# Patient Record
Sex: Male | Born: 1940
Health system: Southern US, Community
[De-identification: ages and names within clinical notes are randomized; demographics above are authoritative.]

## PROBLEM LIST (undated history)

## (undated) DIAGNOSIS — N529 Male erectile dysfunction, unspecified: Secondary | ICD-10-CM

## (undated) DIAGNOSIS — N12 Tubulo-interstitial nephritis, not specified as acute or chronic: Secondary | ICD-10-CM

## (undated) DIAGNOSIS — Z87442 Personal history of urinary calculi: Secondary | ICD-10-CM

## (undated) DIAGNOSIS — T7840XA Allergy, unspecified, initial encounter: Secondary | ICD-10-CM

## (undated) DIAGNOSIS — D3001 Benign neoplasm of right kidney: Secondary | ICD-10-CM

## (undated) DIAGNOSIS — I2510 Atherosclerotic heart disease of native coronary artery without angina pectoris: Secondary | ICD-10-CM

## (undated) DIAGNOSIS — I10 Essential (primary) hypertension: Secondary | ICD-10-CM

## (undated) DIAGNOSIS — M545 Low back pain: Secondary | ICD-10-CM

## (undated) DIAGNOSIS — D49519 Neoplasm of unspecified behavior of unspecified kidney: Secondary | ICD-10-CM

## (undated) DIAGNOSIS — Z973 Presence of spectacles and contact lenses: Secondary | ICD-10-CM

## (undated) DIAGNOSIS — IMO0002 Reserved for concepts with insufficient information to code with codable children: Secondary | ICD-10-CM

## (undated) DIAGNOSIS — E785 Hyperlipidemia, unspecified: Secondary | ICD-10-CM

## (undated) DIAGNOSIS — C61 Malignant neoplasm of prostate: Secondary | ICD-10-CM

## (undated) DIAGNOSIS — N4340 Spermatocele of epididymis, unspecified: Secondary | ICD-10-CM

## (undated) DIAGNOSIS — M1990 Unspecified osteoarthritis, unspecified site: Secondary | ICD-10-CM

## (undated) DIAGNOSIS — K219 Gastro-esophageal reflux disease without esophagitis: Secondary | ICD-10-CM

## (undated) DIAGNOSIS — R351 Nocturia: Secondary | ICD-10-CM

## (undated) DIAGNOSIS — N400 Enlarged prostate without lower urinary tract symptoms: Secondary | ICD-10-CM

## (undated) HISTORY — DX: Malignant neoplasm of prostate: C61

## (undated) HISTORY — DX: Low back pain: M54.5

## (undated) HISTORY — PX: TONSILLECTOMY AND ADENOIDECTOMY: SUR1326

## (undated) HISTORY — PX: TRANSURETHRAL RESECTION OF PROSTATE: SHX73

## (undated) HISTORY — PX: COLONOSCOPY: SHX174

## (undated) HISTORY — DX: Hyperlipidemia, unspecified: E78.5

## (undated) HISTORY — DX: Allergy, unspecified, initial encounter: T78.40XA

## (undated) HISTORY — DX: Essential (primary) hypertension: I10

---

## 1978-02-28 HISTORY — PX: INGUINAL HERNIA REPAIR: SUR1180

## 1997-08-01 ENCOUNTER — Ambulatory Visit (HOSPITAL_COMMUNITY): Admission: RE | Admit: 1997-08-01 | Discharge: 1997-08-01 | Payer: Self-pay | Admitting: Neurology

## 1998-08-31 ENCOUNTER — Encounter: Payer: Self-pay | Admitting: *Deleted

## 1998-08-31 ENCOUNTER — Inpatient Hospital Stay (HOSPITAL_COMMUNITY): Admission: EM | Admit: 1998-08-31 | Discharge: 1998-09-01 | Payer: Self-pay | Admitting: Emergency Medicine

## 1998-08-31 ENCOUNTER — Encounter: Payer: Self-pay | Admitting: Family Medicine

## 2000-01-27 ENCOUNTER — Encounter: Payer: Self-pay | Admitting: Family Medicine

## 2000-01-27 ENCOUNTER — Encounter: Admission: RE | Admit: 2000-01-27 | Discharge: 2000-01-27 | Payer: Self-pay | Admitting: Family Medicine

## 2001-11-27 ENCOUNTER — Encounter: Payer: Self-pay | Admitting: Internal Medicine

## 2004-04-16 ENCOUNTER — Ambulatory Visit: Payer: Self-pay | Admitting: Gastroenterology

## 2004-07-30 ENCOUNTER — Encounter: Admission: RE | Admit: 2004-07-30 | Discharge: 2004-07-30 | Payer: Self-pay | Admitting: Family Medicine

## 2004-07-30 ENCOUNTER — Encounter: Payer: Self-pay | Admitting: Internal Medicine

## 2005-04-07 ENCOUNTER — Encounter: Admission: RE | Admit: 2005-04-07 | Discharge: 2005-04-07 | Payer: Self-pay | Admitting: Family Medicine

## 2005-04-07 ENCOUNTER — Encounter: Payer: Self-pay | Admitting: Internal Medicine

## 2005-04-08 ENCOUNTER — Encounter: Payer: Self-pay | Admitting: Internal Medicine

## 2005-05-16 LAB — HM COLONOSCOPY

## 2005-06-28 DIAGNOSIS — M545 Low back pain, unspecified: Secondary | ICD-10-CM

## 2005-06-28 HISTORY — DX: Low back pain, unspecified: M54.50

## 2005-07-04 ENCOUNTER — Encounter: Admission: RE | Admit: 2005-07-04 | Discharge: 2005-07-04 | Payer: Self-pay | Admitting: Family Medicine

## 2005-07-04 ENCOUNTER — Encounter: Payer: Self-pay | Admitting: Internal Medicine

## 2005-07-14 ENCOUNTER — Encounter: Admission: RE | Admit: 2005-07-14 | Discharge: 2005-07-14 | Payer: Self-pay | Admitting: Family Medicine

## 2005-08-05 ENCOUNTER — Encounter: Admission: RE | Admit: 2005-08-05 | Discharge: 2005-08-05 | Payer: Self-pay | Admitting: Family Medicine

## 2006-02-01 ENCOUNTER — Ambulatory Visit: Payer: Self-pay | Admitting: Gastroenterology

## 2006-02-15 ENCOUNTER — Ambulatory Visit: Payer: Self-pay | Admitting: Gastroenterology

## 2006-02-15 ENCOUNTER — Encounter: Payer: Self-pay | Admitting: Internal Medicine

## 2006-09-11 ENCOUNTER — Encounter: Payer: Self-pay | Admitting: Internal Medicine

## 2007-02-07 ENCOUNTER — Encounter: Payer: Self-pay | Admitting: Internal Medicine

## 2008-03-06 ENCOUNTER — Ambulatory Visit: Payer: Self-pay | Admitting: Internal Medicine

## 2008-03-06 DIAGNOSIS — E785 Hyperlipidemia, unspecified: Secondary | ICD-10-CM

## 2008-03-06 DIAGNOSIS — I10 Essential (primary) hypertension: Secondary | ICD-10-CM

## 2008-03-10 ENCOUNTER — Telehealth: Payer: Self-pay | Admitting: Internal Medicine

## 2008-03-10 ENCOUNTER — Ambulatory Visit: Payer: Self-pay | Admitting: Internal Medicine

## 2008-03-10 LAB — CONVERTED CEMR LAB
ALT: 30 units/L (ref 0–53)
AST: 52 units/L — ABNORMAL HIGH (ref 0–37)
BUN: 18 mg/dL (ref 6–23)
Basophils Relative: 1.4 % (ref 0.0–3.0)
Eosinophils Absolute: 0.1 10*3/uL (ref 0.0–0.7)
GFR calc Af Amer: 109 mL/min
GFR calc non Af Amer: 90 mL/min
LDL Cholesterol: 108 mg/dL — ABNORMAL HIGH (ref 0–99)
Lymphocytes Relative: 27.2 % (ref 12.0–46.0)
Neutro Abs: 4.2 10*3/uL (ref 1.4–7.7)
Neutrophils Relative %: 61.3 % (ref 43.0–77.0)
Potassium: 5.8 meq/L — ABNORMAL HIGH (ref 3.5–5.1)
RBC: 4.87 M/uL (ref 4.22–5.81)
RDW: 12.5 % (ref 11.5–14.6)
TSH: 1.32 microintl units/mL (ref 0.35–5.50)
Total Bilirubin: 1.7 mg/dL — ABNORMAL HIGH (ref 0.3–1.2)
Total Protein: 6.6 g/dL (ref 6.0–8.3)
Triglycerides: 118 mg/dL (ref 0–149)
WBC: 6.7 10*3/uL (ref 4.5–10.5)

## 2008-03-13 ENCOUNTER — Ambulatory Visit: Payer: Self-pay | Admitting: Diagnostic Radiology

## 2008-03-13 ENCOUNTER — Ambulatory Visit (HOSPITAL_BASED_OUTPATIENT_CLINIC_OR_DEPARTMENT_OTHER): Admission: RE | Admit: 2008-03-13 | Discharge: 2008-03-13 | Payer: Self-pay | Admitting: Internal Medicine

## 2008-03-18 ENCOUNTER — Telehealth: Payer: Self-pay | Admitting: Internal Medicine

## 2008-05-13 ENCOUNTER — Ambulatory Visit: Payer: Self-pay | Admitting: Internal Medicine

## 2008-05-13 LAB — CONVERTED CEMR LAB
ALT: 31 units/L (ref 0–53)
BUN: 16 mg/dL (ref 6–23)
CO2: 25 meq/L (ref 19–32)
Creatinine, Ser: 0.9 mg/dL (ref 0.4–1.5)
Glucose, Bld: 101 mg/dL — ABNORMAL HIGH (ref 70–99)
Total Bilirubin: 1.2 mg/dL (ref 0.3–1.2)

## 2008-08-19 ENCOUNTER — Ambulatory Visit: Payer: Self-pay | Admitting: Internal Medicine

## 2008-08-19 LAB — CONVERTED CEMR LAB
BUN: 20 mg/dL (ref 6–23)
CO2: 24 meq/L (ref 19–32)
Chloride: 107 meq/L (ref 96–112)
Glucose, Bld: 97 mg/dL (ref 70–99)
Sodium: 142 meq/L (ref 135–145)

## 2008-08-27 ENCOUNTER — Telehealth: Payer: Self-pay | Admitting: Internal Medicine

## 2008-09-05 ENCOUNTER — Ambulatory Visit: Payer: Self-pay | Admitting: Internal Medicine

## 2008-09-18 ENCOUNTER — Ambulatory Visit: Payer: Self-pay | Admitting: Internal Medicine

## 2008-09-23 ENCOUNTER — Telehealth: Payer: Self-pay | Admitting: Internal Medicine

## 2008-10-23 LAB — CONVERTED CEMR LAB
CO2: 24 meq/L (ref 19–32)
Calcium: 10.5 mg/dL (ref 8.4–10.5)
Creatinine, Ser: 0.97 mg/dL (ref 0.40–1.50)
Glucose, Bld: 103 mg/dL — ABNORMAL HIGH (ref 70–99)
Potassium: 4.7 meq/L (ref 3.5–5.3)

## 2008-11-26 ENCOUNTER — Ambulatory Visit: Payer: Self-pay | Admitting: Internal Medicine

## 2009-03-02 ENCOUNTER — Ambulatory Visit: Payer: Self-pay | Admitting: Internal Medicine

## 2009-03-02 LAB — CONVERTED CEMR LAB
BUN: 18 mg/dL (ref 6–23)
Basophils Absolute: 0 10*3/uL (ref 0.0–0.1)
Basophils Relative: 0 % (ref 0–1)
CO2: 23 meq/L (ref 19–32)
Calcium: 10.4 mg/dL (ref 8.4–10.5)
Eosinophils Absolute: 0.1 10*3/uL (ref 0.0–0.7)
Glucose, Bld: 103 mg/dL — ABNORMAL HIGH (ref 70–99)
Hemoglobin, Urine: NEGATIVE
Leukocytes, UA: NEGATIVE
MCHC: 35.2 g/dL (ref 30.0–36.0)
Neutro Abs: 4.3 10*3/uL (ref 1.7–7.7)
Protein, ur: NEGATIVE mg/dL
Sodium: 144 meq/L (ref 135–145)
Specific Gravity, Urine: 1.02 (ref 1.005–1.030)
Total CHOL/HDL Ratio: 4.6
Total Protein: 6.8 g/dL (ref 6.0–8.3)
Urine Glucose: NEGATIVE mg/dL
VLDL: 31 mg/dL (ref 0–40)
WBC: 7.1 10*3/uL (ref 4.0–10.5)
pH: 6 (ref 5.0–8.0)

## 2009-03-10 ENCOUNTER — Ambulatory Visit: Payer: Self-pay | Admitting: Radiology

## 2009-03-10 ENCOUNTER — Ambulatory Visit (HOSPITAL_BASED_OUTPATIENT_CLINIC_OR_DEPARTMENT_OTHER): Admission: RE | Admit: 2009-03-10 | Discharge: 2009-03-10 | Payer: Self-pay | Admitting: Internal Medicine

## 2009-03-10 ENCOUNTER — Telehealth: Payer: Self-pay | Admitting: Internal Medicine

## 2009-03-10 ENCOUNTER — Ambulatory Visit: Payer: Self-pay | Admitting: Internal Medicine

## 2009-03-10 DIAGNOSIS — N529 Male erectile dysfunction, unspecified: Secondary | ICD-10-CM | POA: Insufficient documentation

## 2009-05-07 ENCOUNTER — Ambulatory Visit: Payer: Self-pay | Admitting: Internal Medicine

## 2009-05-07 LAB — CONVERTED CEMR LAB
Chloride: 106 meq/L (ref 96–112)
Creatinine, Ser: 0.96 mg/dL (ref 0.40–1.50)
Glucose, Bld: 107 mg/dL — ABNORMAL HIGH (ref 70–99)

## 2009-05-12 ENCOUNTER — Ambulatory Visit: Payer: Self-pay | Admitting: Internal Medicine

## 2009-05-12 DIAGNOSIS — K429 Umbilical hernia without obstruction or gangrene: Secondary | ICD-10-CM | POA: Insufficient documentation

## 2009-07-01 ENCOUNTER — Telehealth: Payer: Self-pay | Admitting: Internal Medicine

## 2009-07-15 ENCOUNTER — Encounter: Payer: Self-pay | Admitting: Internal Medicine

## 2009-08-18 ENCOUNTER — Telehealth: Payer: Self-pay | Admitting: Internal Medicine

## 2009-08-18 ENCOUNTER — Encounter: Payer: Self-pay | Admitting: Internal Medicine

## 2009-08-21 ENCOUNTER — Ambulatory Visit (HOSPITAL_COMMUNITY): Admission: RE | Admit: 2009-08-21 | Discharge: 2009-08-23 | Payer: Self-pay | Admitting: Surgery

## 2009-08-21 ENCOUNTER — Encounter (INDEPENDENT_AMBULATORY_CARE_PROVIDER_SITE_OTHER): Payer: Self-pay | Admitting: Surgery

## 2009-08-21 HISTORY — PX: OTHER SURGICAL HISTORY: SHX169

## 2009-08-25 ENCOUNTER — Telehealth: Payer: Self-pay | Admitting: Internal Medicine

## 2009-10-02 ENCOUNTER — Encounter: Payer: Self-pay | Admitting: Internal Medicine

## 2009-11-09 ENCOUNTER — Encounter: Payer: Self-pay | Admitting: Internal Medicine

## 2009-11-09 LAB — CONVERTED CEMR LAB
CO2: 25 meq/L (ref 19–32)
Calcium: 10.4 mg/dL (ref 8.4–10.5)
Creatinine, Ser: 0.84 mg/dL (ref 0.40–1.50)
Hgb A1c MFr Bld: 5.5 % (ref <5.7)

## 2009-11-20 ENCOUNTER — Ambulatory Visit: Payer: Self-pay | Admitting: Internal Medicine

## 2010-02-12 ENCOUNTER — Encounter: Payer: Self-pay | Admitting: Internal Medicine

## 2010-03-21 ENCOUNTER — Encounter: Payer: Self-pay | Admitting: Family Medicine

## 2010-03-30 NOTE — Progress Notes (Signed)
Summary: Surgical Referral  Phone Note Call from Patient Call back at [phone]   Caller: Patient Call For: [name] DO Reason for Call: Referral Summary of Call: patient called and left voice message stating he saw Dr Artist Pais a couple of months ago about a hernia. He states the hernia is causing him problems now and would like to know if he could get a referral to  a surgeon for evaluation Initial call taken by: Glendell Docker CMA,  Jul 01, 2009 3:20 PM  Follow-up for Phone Call        Referral received   Appt CCS   Dr Corliss Skains   May 18 Follow-up by: Darral Dash,  Jul 02, 2009 2:39 PM

## 2010-03-30 NOTE — Consult Note (Signed)
Summary: [facility]   Imported By: Lanelle Bal 09/15/2009 09:33:08  _____________________________________________________________________  External Attachment:    Type:   Image     Comment:   External Document

## 2010-03-30 NOTE — Assessment & Plan Note (Signed)
Summary: 3 month fu/dt also flu shot/dt   Vital Signs:  Patient profile:   70 year old male Weight:      193.75 pounds BMI:     26.37 O2 Sat:      97 % on Room air Temp:     98.24 degrees F oral Pulse rate:   54 / minute Pulse rhythm:   regular Resp:     16 per minute BP sitting:   122 / 68  (right arm) Cuff size:   large  Vitals Entered By: Glendell Docker CMA (November 20, 2009 8:03 AM)  O2 Flow:  Room air CC: 3 Month Follow up  Is Patient Diabetic? No Pain Assessment Patient in pain? no      Comments no concerns, refill on 90 day supply on Azor    Primary Care Provider:  Dondra Spry DO  CC:  3 Month Follow up .  History of Present Illness:  Hypertension Follow-Up      This is a 70 year old man who presents for Hypertension follow-up.  The patient denies lightheadedness.  The patient denies the following associated symptoms: chest pain.  Compliance with medications (by patient report) has been near 100%.  The patient reports exercising 3-4X per week.  wife told by her cardiologist to use exercise bike.   he has been using regularly also. he lost wt by cutting back on portion size.  hyperlipidemia - no side effects - myalgias  s/p umbilical hernia repair with mesh.  no complications   ED - 50 yr anniversary coming up tried cialis 10 mg - no side effects .  some improvement    Preventive Screening-Counseling & Management  Alcohol-Tobacco     Smoking Status: quit  Allergies: 1)  Ace Inhibitors  Past History:  Past Medical History: Hyperlipidemia - 20 years Hypertension - 2006   Hx of kidney stones 2007  Family hx of hyperlipidemia Hx of rectal bleeding.  Hx of LBP -  L5 radicular symptoms      MRI of LS spine Severe spondylosis at L4-L5 with central canal stenosis  06/2005 Hx of microscopic hematuria - benign Mildly nodular prostate with BPH on conservative management - Dr. Aldean Ast     Past Surgical History: Colonoscopy 02/15/2006 - Internal  hemorrhoids (Dr. Jarold Motto)     umbilical hernia repair - 2011 Dr. Corliss Skains  Review of Systems       ED- tried cialis  Physical Exam  General:  alert, well-developed, and well-nourished.   Neck:  No deformities, masses, or tenderness noted.no carotid bruits.   Lungs:  normal respiratory effort and normal breath sounds.   Heart:  normal rate, regular rhythm, and no gallop.   Extremities:  No lower extremity edema    Impression & Recommendations:  Problem # 1:  HYPERTENSION (ICD-401.9) Assessment Unchanged  His updated medication list for this problem includes:    Azor 5-20 Mg Tabs (Amlodipine-olmesartan) ..... One by mouth qd  BP today: 122/68 Prior BP: 120/70 (05/12/2009)  Labs Reviewed: K+: 4.8 (11/09/2009) Creat: : 0.84 (11/09/2009)   Chol: 196 (03/02/2009)   HDL: 43 (03/02/2009)   LDL: 122 (03/02/2009)   TG: 157 (03/02/2009)  Problem # 2:  HYPERLIPIDEMIA (ICD-272.4) Assessment: Unchanged we discussed potential interaction with amlodipine.  take 1/2 of 40 mg.  His updated medication list for this problem includes:    Simvastatin 40 Mg Tabs (Simvastatin) .[name]... Take 1/2  tablet by mouth once a day  Complete Medication List: 1)  Simvastatin 40 Mg Tabs (Simvastatin) .... Take 1/2  tablet by mouth once a day 2)  Aspirin Ec Low Dose 81 Mg Tbec (Aspirin) .... Take 1 tablet by mouth once a day 3)  Multivitamins Tabs (Multiple vitamin) .... Take 1 tablet by mouth once a day 4)  Fish Oil 1200 Mg Caps (Omega-3 fatty acids) .... Take 1 tablet by mouth two times a day 5)  Azor 5-20 Mg Tabs (Amlodipine-olmesartan) .... One by mouth qd 6)  Cialis 20 Mg Tabs (Tadalafil) .... 1/2 to one tab by mouth once daily prn  Patient Instructions: 1)  Please schedule a follow-up appointment in 6 months - 30 minute visit 2)  BMP prior to visit, ICD-9:  401.9 3)  Hepatic Panel prior to visit, ICD-9: 272.4 4)  Lipid Panel prior to visit, ICD-9: 272.4 5)  PSA prior to visit, ICD-9: v76.44 6)   Please return for lab work one (1) week before your next appointment.  Prescriptions: CIALIS 20 MG TABS (TADALAFIL) 1/2 to one tab by mouth once daily prn  #10 x 5   Entered and Authorized by:   [name] DO   Signed by:   [name] DO on 11/20/2009   Method used:   Electronically to        [facility]. [phone]* (retail)       [phone]       [place], [place]  [address]       Ph: [phone]       Fax: [phone]   RxID:   [id number] SIMVASTATIN 40 MG TABS (SIMVASTATIN) Take 1/2  tablet by mouth once a day  #90 x 1   Entered and Authorized by:   [name] DO   Signed by:   [name] DO on 11/20/2009   Method used:   Electronically to        [facility]. [phone]* (retail)       [phone]       [place], [place]  [address]       Ph: [phone]       Fax: [phone]   RxID:   [id number] AZOR 5-20 MG TABS (AMLODIPINE-OLMESARTAN) one by mouth qd  #90 x 3   Entered and Authorized by:   [name] DO   Signed by:   [name] DO on 11/20/2009   Method used:   Electronically to        [facility]. [phone]* (retail)       [phone] [address]       [place], [place]  [address]       Ph: [phone]       Fax: [phone]   RxID:   [id number]     Current Allergies (reviewed today): ACE INHIBITORS  Appended Document: Orders Update    Clinical Lists Changes  Orders: Added new Service order of Influenza Vaccine MCR [phone]) - Signed Added new Service order of Flu Vaccine 90yrs + MEDICARE PATIENTS ([id number]) - Signed Observations: Added new observation of FLU VAX#1VIS: 09/22/09 version given November 20, 2009. (11/20/2009 8:37) Added new observation of FLU VAXLOT: [id number] (11/20/2009 8:37) Added new observation of FLU VAX EXP: 08/28/2010 (11/20/2009 8:37) Added new observation of FLU VAXBY: Darlene Knight CMA (11/20/2009  8:37) Added new observation of FLU  VAXRTE: IM (11/20/2009 8:37) Added new observation of FLU VAX DSE: 0.5 ml (11/20/2009 8:37) Added new observation of FLU VAXMFR: GlaxoSmithKline (11/20/2009 8:37) Added new observation of FLU VAX SITE: left deltoid (11/20/2009 8:37) Added new observation of FLU VAX: Fluvax MCR (11/20/2009 8:37)       Immunizations Administered:  Influenza Vaccine # 1:    Vaccine Type: Fluvax MCR    Site: left deltoid    Mfr: GlaxoSmithKline    Dose: 0.5 ml    Route: IM    Given by: Glendell Docker CMA    Exp. Date: 08/28/2010    Lot #: [id number]    VIS given: 09/22/09 version given November 20, 2009.  Flu Vaccine Consent Questions:    Do you have a history of severe allergic reactions to this vaccine? no    Any prior history of allergic reactions to egg and/or gelatin? no    Do you have a sensitivity to the preservative Thimersol? no    Do you have a past history of Guillan-Barre Syndrome? no    Do you currently have an acute febrile illness? no    Have you ever had a severe reaction to latex? no    Vaccine information given and explained to patient? yes

## 2010-03-30 NOTE — Miscellaneous (Signed)
Summary: bmp,hgb a1c  Clinical Lists Changes  Orders: Added new Test order of T-Basic Metabolic Panel ([id number]) - Signed Added new Test order of T- Hemoglobin A1C ([id number]) - Signed 

## 2010-03-30 NOTE — Assessment & Plan Note (Signed)
Summary: 2 mo. f/u - jr   Vital Signs:  Patient profile:   70 year old male Weight:      203.25 pounds BMI:     27.67 O2 Sat:      99 % on Room air Temp:     97.0 degrees F oral Pulse rate:   55 / minute Pulse rhythm:   regular Resp:     19 per minute BP sitting:   120 / 70  (right arm) Cuff size:   large  Vitals Entered By: Glendell Docker CMA (May 12, 2009 8:06 AM)  O2 Flow:  Room air CC: Rm 3- 2 Month Follow up    Primary Care Provider:  Dondra Spry DO  CC:  Rm 3- 2 Month Follow up .  History of Present Illness:  Hypertension Follow-Up      This is a 70 year old man who presents for Hypertension follow-up.  The patient denies lightheadedness and urinary frequency.  The patient denies the following associated symptoms: chest pain.  Compliance with medications (by patient report) has been near 100%.  The patient reports that dietary compliance has been fair.  The patient reports exercising occasionally.  cough resolved since stopping ACE  pt thinks umbilical hernia getting bigger.  still reducible.  no pain  Allergies (verified): 1)  Ace Inhibitors  Past History:  Past Medical History: Hyperlipidemia - 20 years Hypertension - 2006  Hx of kidney stones 2007  Family hx of hyperlipidemia Hx of rectal bleeding.  Hx of LBP -  L5 radicular symptoms      MRI of LS spine Severe spondylosis at L4-L5 with central canal stenosis  06/2005 Hx of microscopic hematuria - benign Mildly nodular prostate with BPH on conservative management - Dr. Aldean Ast     Family History: CAD , hyperlipidemia - multiple family members - mother's side Prostate ca - no Colon ca - mother Diabetes - no   Father had pulmonary fibrosis - his father owned Pharmacologist business (father blamed inhalation of chemicals/fumes)   Social History: Married -48 years 2 daughter 5 grandchildren  Never Smoked  Alcohol use-yes (occasional/social)  Retired- Paramedic   Physical Exam  General:   alert, well-developed, and well-nourished.   Lungs:  normal respiratory effort and normal breath sounds.   Heart:  normal rate, regular rhythm, and no gallop.   Abdomen:  soft, non-tender, normal bowel sounds, no hepatomegaly, and no splenomegaly.  umbilical hernia (reducible) Extremities:  No lower extremity edema    Impression & Recommendations:  Problem # 1:  HYPERTENSION (ICD-401.9) Assessment Improved ACE cough resolved with stopping ACE.  Maintain current medication regimen.  His updated medication list for this problem includes:    Azor 5-20 Mg Tabs (Amlodipine-olmesartan) ..... One by mouth qd  BP today: 120/70 Prior BP: 130/80 (03/10/2009)  Labs Reviewed: K+: 4.7 (05/07/2009) Creat: : 0.96 (05/07/2009)   Chol: 196 (03/02/2009)   HDL: 43 (03/02/2009)   LDL: 122 (03/02/2009)   TG: 157 (03/02/2009)  Problem # 2:  HERNIA, UMBILICAL (ICD-553.1) Assessment: Deteriorated umbilical hernia slightly worse.  he defers referral to surgeon - Dr. Georgiann Mohs.  reviewed red flag symptoms  Complete Medication List: 1)  Simvastatin 40 Mg Tabs (Simvastatin) .... Take 1 tablet by mouth once a day 2)  Aspirin Ec Low Dose 81 Mg Tbec (Aspirin) .... Take 1 tablet by mouth once a day 3)  Multivitamins Tabs (Multiple vitamin) .... Take 1 tablet by mouth once a day 4)  Fish Oil 1200 Mg Caps (Omega-3 fatty acids) .... Take 1 tablet by mouth two times a day 5)  Azor 5-20 Mg Tabs (Amlodipine-olmesartan) .... One by mouth qd 6)  Levitra 20 Mg Tabs (Vardenafil hcl) .... 1/2 by mouth once daily prn  Patient Instructions: 1)  Please schedule a follow-up appointment in 6 months. 2)  BMP prior to visit, ICD-9:  401.9 3)  HbgA1C prior to visit, ICD-9:  790.29 4)  Please return for lab work one (1) week before your next appointment.   Current Allergies (reviewed today): ACE INHIBITORS

## 2010-03-30 NOTE — Letter (Signed)
Summary: [facility]   Imported By: Lanelle Bal 05/19/2009 08:51:40  _____________________________________________________________________  External Attachment:    Type:   Image     Comment:   External Document

## 2010-03-30 NOTE — Assessment & Plan Note (Signed)
Summary: cpx/mhf rsc with patient weather/mhf   Vital Signs:  Patient profile:   70 year old male Height:      72 inches Weight:      221.75 pounds BMI:     30.18 O2 Sat:      97 % on Room air Temp:     98.4 degrees F oral Pulse rate:   66 / minute Pulse rhythm:   regular Resp:     18 per minute BP sitting:   130 / 80  (right arm) Cuff size:   large  Vitals Entered By: Glendell Docker CMA (March 10, 2009 10:03 AM)  O2 Flow:  Room air  Primary Care Provider:  [name] DO  CC:  CPX.  History of Present Illness: CPX  70 y/o with hx of htn, hyperlipidemia, and low back pain for follow up.  back pain better. occ flares c/o intermittent left hip pain - worse with prolonged sitting  Htn - no dizziness.  c/o intermittent chronic dry cough.  no chest pain. no SOB father noted to have hx of pulm fibrosis  hyperlipidemia - stable.   no reg exercise. no wt change    Preventive Screening-Counseling & Management  Alcohol-Tobacco     Alcohol drinks/day: <1     Smoking Status: quit  Caffeine-Diet-Exercise     Caffeine use/day: 2 beverages daily     Does Patient Exercise: no  EKG  Procedure date:  03/10/2009  Findings:      Normal sinus rhythm with rate of:  63 bpm Right bundle branch block.    Allergies (verified): No Known Drug Allergies  Past History:  Past Medical History: Hyperlipidemia - 20 years Hypertension - 2006  Hx of kidney stones 2007 Family hx of hyperlipidemia Hx of rectal bleeding.  Hx of LBP -  L5 radicular symptoms      MRI of LS spine Severe spondylosis at L4-L5 with central canal stenosis  06/2005 Hx of microscopic hematuria - benign Mildly nodular prostate with BPH on conservative management - Dr. Aldean Ast     Past Surgical History: Colonoscopy 02/15/2006 - Internal hemorrhoids (Dr. Jarold Motto)     Family History: CAD , hyperlipidemia - multiple family members - mother's side Prostate ca - no Colon ca - mother Diabetes - no     Father had pulmonary fibrosis - his father owned Pharmacologist business (father blamed inhalation of chemicals/fumes)  Social History: Married -48 years 2 daughter 5 grandchildren  Never Smoked  Alcohol use-yes (occasional/social)  Retired- Paramedic Smoking Status:  quit Caffeine use/day:  2 beverages daily  Review of Systems       The patient complains of prolonged cough.  The patient denies weight loss, vision loss, chest pain, dyspnea on exertion, abdominal pain, and severe indigestion/heartburn.         not sure if umbilical hernia got bigger - no pain.  poor erection quality.  occ left hip pain - gets worse when he sits in his wife's car (low to the ground).  no pain with prolonged standing or walking  Physical Exam  General:  alert, well-developed, and well-nourished.   Head:  normocephalic and atraumatic.   Eyes:  pupils equal, pupils round, and pupils reactive to light.   Ears:  R ear normal and L ear normal.   Mouth:  Oral mucosa and oropharynx without lesions or exudates.  Teeth in good repair. Neck:  No deformities, masses, or tenderness noted.no carotid bruits.   Lungs:  normal respiratory effort, normal breath sounds, no crackles, and no wheezes.   Heart:  normal rate, regular rhythm, no murmur, and no gallop.   Abdomen:  soft, non-tender, normal bowel sounds, no hepatomegaly, and no splenomegaly.  umbilical hernia Msk:  mild pain left hip with ext rotation.  no hip tenderness Extremities:  No lower extremity edema  Neurologic:  cranial nerves II-XII intact and gait normal.   Psych:  normally interactive, good eye contact, not anxious appearing, and not depressed appearing.     Impression & Recommendations:  Problem # 1:  PREVENTIVE HEALTH CARE (ICD-V70.0) Reviewed adult health maintenance protocols.  He has hx of nodular prostate. pt advised to f/u with urologist.  Colonoscopy: normal (05/16/2005) Td Booster: Tdap (State) (03/06/2008)   Flu Vax: Fluvax  MCR (11/26/2008)   Pneumovax: Pneumovax (Medicare) (03/06/2008) Chol: 196 (03/02/2009)   HDL: 43 (03/02/2009)   LDL: 122 (03/02/2009)   TG: 157 (03/02/2009) TSH: 1.273 (03/02/2009)     Problem # 2:  HYPERLIPIDEMIA (ICD-272.4) HDL improved.  he has been taking fish oil regularly.  His updated medication list for this problem includes:    Simvastatin 40 Mg Tabs (Simvastatin) .[name]... Take 1 tablet by mouth once a day  Labs Reviewed: SGOT: 19 (03/02/2009)   SGPT: 26 (03/02/2009)   HDL:43 (03/02/2009), 29.0 (03/10/2008)  LDL:122 (03/02/2009), 108 (03/10/2008)  Chol:196 (03/02/2009), 161 (03/10/2008)  Trig:157 (03/02/2009), 118 (03/10/2008)  Problem # 3:  COUGH (ICD-786.2) He notes chronic intermittent cough.   I suspect ACE.  check CXR.  He notes father had pulmonary fibrosis Orders: T-2 View CXR, Same Day (71020.5TC)  Problem # 4:  ERECTILE DYSFUNCTION, ORGANIC (ICD-607.84)  His updated medication list for this problem includes:    Levitra 20 Mg Tabs (Vardenafil hcl) .[name]... 1/2 by mouth once daily prn  Discussed proper use of medications, as well as side effects.   Problem # 5:  HYPERTENSION (ICD-401.9) He has chronic intermittent dry cough.  symptoms may be related to ACE.  switch to ARB.  The following medications were removed from the medication list:    Norvasc 5 Mg Tabs (Amlodipine besylate) .[name]... Take 1 tablet by mouth once a day    Lisinopril 5 Mg Tabs (Lisinopril) ..... One by mouth qd His updated medication list for this problem includes:    Azor 5-20 Mg Tabs (Amlodipine-olmesartan) ..... One by mouth qd  BP today: 130/80 Prior BP: 116/62 (09/05/2008)  Labs Reviewed: K+: 4.9 (03/02/2009) Creat: : 0.95 (03/02/2009)   Chol: 196 (03/02/2009)   HDL: 43 (03/02/2009)   LDL: 122 (03/02/2009)   TG: 157 (03/02/2009)  Complete Medication List: 1)  Simvastatin 40 Mg Tabs (Simvastatin) .... Take 1 tablet by mouth once a day 2)  Aspirin Ec Low Dose 81 Mg Tbec (Aspirin) .... Take 1  tablet by mouth once a day 3)  Multivitamins Tabs (Multiple vitamin) .... Take 1 tablet by mouth once a day 4)  Fish Oil 1200 Mg Caps (Omega-3 fatty acids) .... Take 1 tablet by mouth two times a day 5)  Azor 5-20 Mg Tabs (Amlodipine-olmesartan) .... One by mouth qd 6)  Levitra 20 Mg Tabs (Vardenafil hcl) .... 1/2 by mouth once daily prn  Patient Instructions: 1)  Please schedule a follow-up appointment in 2 months. 2)  BMP prior to visit, ICD-9: 401.9 3)  Please return for lab work one (1) week before your next appointment.  4)  [email or web address] Prescriptions: AZOR 5-20 MG TABS (AMLODIPINE-OLMESARTAN) one by mouth qd  #90 x  3   Entered and Authorized by:   [name] DO   Signed by:   [name] DO on 03/10/2009   Method used:   Print then Give to Patient   RxID:   [id number]   Current Allergies (reviewed today): No known allergies

## 2010-03-30 NOTE — Progress Notes (Signed)
Summary: Test Results  Phone Note Outgoing Call   Summary of Call: call pt - no acute findings.  I will discuss further at next OV.  have pt call office if cough does not get better with switching BP medication Initial call taken by: [name] DO,  March 10, 2009 11:37 AM  Follow-up for Phone Call        patient advised per Dr Artist Pais instructions Follow-up by: Glendell Docker CMA,  March 10, 2009 11:55 AM

## 2010-03-30 NOTE — Progress Notes (Signed)
Summary: Azor Refill  Phone Note Refill Request Message from:  Patient on August 18, 2009 11:14 AM  Refills Requested: Medication #1:  AZOR 5-20 MG TABS one by mouth qd   Dosage confirmed as above?Dosage Confirmed   Brand Name Necessary? No   Supply Requested: 1 month patient called and left voice message requesting refill for Azor.    Method Requested: Electronic Next Appointment Scheduled: None Initial call taken by: Glendell Docker CMA,  August 18, 2009 11:14 AM  Follow-up for Phone Call        Rx completed in Dr. Tiajuana Amass Follow-up by: Glendell Docker CMA,  August 18, 2009 11:16 AM    Prescriptions: AZOR 5-20 MG TABS (AMLODIPINE-OLMESARTAN) one by mouth qd  #30 x 5   Entered by:   Glendell Docker CMA   Authorized by:   [name] DO   Signed by:   Glendell Docker CMA on 08/18/2009   Method used:   Electronically to        [facility]. [phone]* (retail)       [phone]       [place], [place]  [address]       Ph: [phone]       Fax: [phone]   RxID:   [id number]

## 2010-03-30 NOTE — Letter (Signed)
Summary: [facility]  [facility]   Imported By: Lanelle Bal 08/26/2009 08:04:15  _____________________________________________________________________  External Attachment:    Type:   Image     Comment:   External Document

## 2010-03-30 NOTE — Progress Notes (Signed)
Summary: refill--simvastatin  Phone Note Refill Request Message from:  Fax from [facility] [address] on August 25, 2009 9:42 AM  Refills Requested: Medication #1:  SIMVASTATIN 40 MG TABS Take 1 tablet by mouth once a day   Dosage confirmed as above?Dosage Confirmed   Supply Requested: 3 months   Last Refilled: 05/28/2009 Initial call taken by: Mervin Kung CMA,  August 25, 2009 12:22 PM    Prescriptions: SIMVASTATIN 40 MG TABS (SIMVASTATIN) Take 1 tablet by mouth once a day  #90 x 0   Entered by:   Mervin Kung CMA   Authorized by:   [name] DO   Signed by:   Mervin Kung CMA on 08/25/2009   Method used:   Electronically to        [facility]. [phone]* (retail)       [phone]       [place], [place]  [address]       Ph: [phone]       Fax: [phone]   RxID:   [id number]

## 2010-03-30 NOTE — Letter (Signed)
Summary: [facility]   Imported By: Lanelle Bal 10/21/2009 10:08:14  _____________________________________________________________________  External Attachment:    Type:   Image     Comment:   External Document

## 2010-04-01 ENCOUNTER — Telehealth: Payer: Self-pay | Admitting: Internal Medicine

## 2010-04-01 NOTE — Letter (Signed)
Summary: [facility]  [facility]   Imported By: Lanelle Bal 02/26/2010 09:19:20  _____________________________________________________________________  External Attachment:    Type:   Image     Comment:   External Document

## 2010-04-07 NOTE — Progress Notes (Signed)
Summary: Blood Work  Advice worker from Patient   Caller: Patient Call For: darlene Reason for Call: Talk to Nurse Summary of Call: pt has a 6 mth follow up appt with dr. Artist Pais on 03.06.2012 at 8:00 am. pt was wondering wether he should come in a few days early for blood work or do it same day as appt. cell phone# [phone]. okay to leave message. please assist. Initial call taken by: Elba Barman,  April 01, 2010 9:34 AM  Follow-up for Phone Call        call returned to patient at [phone], he was advised to have his blood work done one week prior to his office visit, so labs can be reviewed at the time of his office visit. Patient verbalized understanding  and agrees as instructed. He states he talked to Dr Artist Pais about having his PSA done, but states that he already had it checked in Decemebr and will be following back up in April with his Urologist. Patient was informed that we did receive a office note from his December visit, but I did not see the results of his PSA. He was asked to obtain a copy of the results and have them forwarded to Dr Artist Pais. Patient had verbalized understanding and agrees as instructed Follow-up by: Glendell Docker CMA,  April 01, 2010 9:54 AM

## 2010-04-27 LAB — CONVERTED CEMR LAB
ALT: 23 units/L (ref 0–53)
Alkaline Phosphatase: 90 units/L (ref 39–117)
Calcium: 10.4 mg/dL (ref 8.4–10.5)
Chloride: 105 meq/L (ref 96–112)
Cholesterol: 176 mg/dL (ref 0–200)
HDL: 47 mg/dL (ref >39)
Indirect Bilirubin: 0.6 mg/dL (ref 0.0–0.9)
Total Bilirubin: 0.7 mg/dL (ref 0.3–1.2)
Total CHOL/HDL Ratio: 3.7
Triglycerides: 95 mg/dL (ref <150)

## 2010-05-04 ENCOUNTER — Encounter: Payer: Self-pay | Admitting: Internal Medicine

## 2010-05-04 ENCOUNTER — Ambulatory Visit (INDEPENDENT_AMBULATORY_CARE_PROVIDER_SITE_OTHER): Payer: Medicare Other | Admitting: Internal Medicine

## 2010-05-04 DIAGNOSIS — E785 Hyperlipidemia, unspecified: Secondary | ICD-10-CM

## 2010-05-04 DIAGNOSIS — I10 Essential (primary) hypertension: Secondary | ICD-10-CM

## 2010-05-04 DIAGNOSIS — R972 Elevated prostate specific antigen [PSA]: Secondary | ICD-10-CM

## 2010-05-04 DIAGNOSIS — C61 Malignant neoplasm of prostate: Secondary | ICD-10-CM | POA: Insufficient documentation

## 2010-05-16 LAB — COMPREHENSIVE METABOLIC PANEL
Albumin: 3.9 g/dL (ref 3.5–5.2)
BUN: 13 mg/dL (ref 6–23)
CO2: 26 mEq/L (ref 19–32)
Calcium: 10.1 mg/dL (ref 8.4–10.5)
Chloride: 109 mEq/L (ref 96–112)
Creatinine, Ser: 0.92 mg/dL (ref 0.4–1.5)
Total Bilirubin: 0.6 mg/dL (ref 0.3–1.2)
Total Protein: 6.7 g/dL (ref 6.0–8.3)

## 2010-05-16 LAB — SURGICAL PCR SCREEN
MRSA, PCR: NEGATIVE
Staphylococcus aureus: NEGATIVE

## 2010-05-16 LAB — CBC: WBC: 6.8 10*3/uL (ref 4.0–10.5)

## 2010-05-16 LAB — DIFFERENTIAL
Basophils Absolute: 0 10*3/uL (ref 0.0–0.1)
Eosinophils Relative: 1 % (ref 0–5)
Lymphocytes Relative: 30 % (ref 12–46)
Lymphs Abs: 2.1 10*3/uL (ref 0.7–4.0)
Neutro Abs: 4 10*3/uL (ref 1.7–7.7)
Neutrophils Relative %: 58 % (ref 43–77)

## 2010-05-27 NOTE — Assessment & Plan Note (Signed)
Summary: 6 mth follow up/ss   Vital Signs:  Patient profile:   70 year old male Height:      72 inches Weight:      194.25 pounds BMI:     26.44 O2 Sat:      99 % on Room air Temp:     97.6 degrees F oral Pulse rate:   57 / minute Resp:     16 per minute BP sitting:   124 / 70  (right arm) Cuff size:   large  Vitals Entered By: Glendell Docker CMA (May 04, 2010 8:07 AM)  O2 Flow:  Room air CC: 6 Month Follow up  Is Patient Diabetic? No Pain Assessment Patient in pain? no      Comments no concerns   Primary Care Provider:  Dondra Spry DO  CC:  6 Month Follow up .  History of Present Illness: 70 y/o male for follow up  * nodular prostate with elevated PSA - followed by Dr. Aldean Ast PSA back down to 4   * Htn -  pt taking 1/2 of azor bp still well controlled no adverse effects fair diet  * Hyperlipidemia - denies memory loss,  denies muscle ache  Preventive Screening-Counseling & Management  Alcohol-Tobacco     Smoking Status: quit  Allergies: 1)  Ace Inhibitors  Past History:  Past Medical History: Hyperlipidemia - 20 years Hypertension - 2006    Hx of kidney stones 2007   Family hx of hyperlipidemia Hx of rectal bleeding.  Hx of LBP -  L5 radicular symptoms      MRI of LS spine Severe spondylosis at L4-L5 with central canal stenosis  06/2005 Hx of microscopic hematuria - benign Mildly nodular prostate with BPH on conservative management - Dr. Aldean Ast     Past Surgical History: Colonoscopy 02/15/2006 - Internal hemorrhoids (Dr. Jarold Motto)     umbilical hernia repair - 2011 Dr. Corliss Skains   Family History: CAD , hyperlipidemia - multiple family members - mother's side Prostate ca - no Colon ca - mother Diabetes - no   Father had pulmonary fibrosis - his father owned Pharmacologist business (father blamed inhalation of chemicals/fumes)    Social History: Married -48 years 2 daughter 5 grandchildren  Never Smoked  Alcohol use-yes  (occasional/social)   Retired- Paramedic   Physical Exam  General:  alert, well-developed, and well-nourished.   Nose:  no external deformity.   Lungs:  normal respiratory effort and normal breath sounds.   Heart:  normal rate, regular rhythm, no murmur, and no gallop.   Extremities:  No lower extremity edema   Impression & Recommendations:  Problem # 1:  HYPERTENSION (ICD-401.9) Assessment Unchanged  His updated medication list for this problem includes:    Azor 5-20 Mg Tabs (Amlodipine-olmesartan) ..... One by mouth qd  BP today: 124/70 Prior BP: 122/68 (11/20/2009)  Labs Reviewed: K+: 4.7 (04/27/2010) Creat: : 0.93 (04/27/2010)   Chol: 176 (04/27/2010)   HDL: 47 (04/27/2010)   LDL: 110 (04/27/2010)   TG: 95 (04/27/2010)  Problem # 2:  HYPERLIPIDEMIA (ICD-272.4) Assessment: Unchanged  His updated medication list for this problem includes:    Simvastatin 40 Mg Tabs (Simvastatin) .[name]... Take 1/2  tablet by mouth once a day  Labs Reviewed: SGOT: 19 (04/27/2010)   SGPT: 23 (04/27/2010)   HDL:47 (04/27/2010), 43 (03/02/2009)  LDL:110 (04/27/2010), 122 (03/02/2009)  Chol:176 (04/27/2010), 196 (03/02/2009)  Trig:95 (04/27/2010), 157 (03/02/2009)  Problem # 3:  PSA,  INCREASED (ICD-790.93) followed by urology Dr. Aldean Ast retiring pt to establish with new urologist recent PSA decreased to 4  Complete Medication List: 1)  Simvastatin 40 Mg Tabs (Simvastatin) .... Take 1/2  tablet by mouth once a day 2)  Aspirin Ec Low Dose 81 Mg Tbec (Aspirin) .... Take 1 tablet by mouth once a day 3)  Multivitamins Tabs (Multiple vitamin) .... Take 1 tablet by mouth once a day 4)  Fish Oil 1200 Mg Caps (Omega-3 fatty acids) .... Take 1 tablet by mouth two times a day 5)  Azor 5-20 Mg Tabs (Amlodipine-olmesartan) .... One by mouth qd 6)  Cialis 20 Mg Tabs (Tadalafil) .... 1/2 to one tab by mouth once daily prn  Patient Instructions: 1)  Please schedule a follow-up appointment  in 6 months. 2)  BMP prior to visit, ICD-9:  401.9 3)  Please return for lab work one (1) week before your next appointment.  Prescriptions: SIMVASTATIN 40 MG TABS (SIMVASTATIN) Take 1/2  tablet by mouth once a day  #90 x 1   Entered and Authorized by:   [name] DO   Signed by:   [name] DO on 05/04/2010   Method used:   Print then Give to Patient   RxID:   [id number]    Orders Added: 1)  Est. Patient Level III [14782] 2)  Est. Patient Level III [95621]    Current Allergies (reviewed today): ACE INHIBITORS

## 2010-06-29 ENCOUNTER — Telehealth: Payer: Self-pay | Admitting: Internal Medicine

## 2010-06-29 NOTE — Telephone Encounter (Signed)
Pt states that he had a missed call from our office this morning. I'm not sure if you called him.

## 2010-06-30 ENCOUNTER — Telehealth: Payer: Self-pay | Admitting: *Deleted

## 2010-06-30 NOTE — Telephone Encounter (Signed)
There was not a call placed to patient by me

## 2010-08-17 NOTE — Telephone Encounter (Signed)
No action required.

## 2010-11-18 ENCOUNTER — Telehealth: Payer: Self-pay | Admitting: Internal Medicine

## 2010-11-18 ENCOUNTER — Ambulatory Visit (INDEPENDENT_AMBULATORY_CARE_PROVIDER_SITE_OTHER): Payer: Medicare Other

## 2010-11-18 DIAGNOSIS — I10 Essential (primary) hypertension: Secondary | ICD-10-CM

## 2010-11-18 DIAGNOSIS — Z23 Encounter for immunization: Secondary | ICD-10-CM

## 2010-11-18 NOTE — Telephone Encounter (Signed)
bmet - 401.9

## 2010-11-18 NOTE — Telephone Encounter (Signed)
PATIENT IS COMING FOR 6 MONTH FOLLOW UP ON 9-27  HE WOULD LIKE TO COME TO [facility] FOR HIS LABS ON Monday 9 24.  PLEASE CALL THE PATIENT WITH TIME

## 2010-11-18 NOTE — Telephone Encounter (Signed)
What labs do you want pt to have drawn?

## 2010-11-19 ENCOUNTER — Other Ambulatory Visit (INDEPENDENT_AMBULATORY_CARE_PROVIDER_SITE_OTHER): Payer: Medicare Other

## 2010-11-19 DIAGNOSIS — I10 Essential (primary) hypertension: Secondary | ICD-10-CM

## 2010-11-19 LAB — BASIC METABOLIC PANEL
BUN: 25 mg/dL — ABNORMAL HIGH (ref 6–23)
Potassium: 5 mEq/L (ref 3.5–5.1)

## 2010-11-19 NOTE — Telephone Encounter (Signed)
Future order placed 

## 2010-11-19 NOTE — Telephone Encounter (Signed)
Cindy please do order for bmet

## 2010-11-25 ENCOUNTER — Ambulatory Visit (INDEPENDENT_AMBULATORY_CARE_PROVIDER_SITE_OTHER): Payer: Medicare Other | Admitting: Internal Medicine

## 2010-11-25 ENCOUNTER — Encounter: Payer: Self-pay | Admitting: Internal Medicine

## 2010-11-25 DIAGNOSIS — I10 Essential (primary) hypertension: Secondary | ICD-10-CM

## 2010-11-25 DIAGNOSIS — R972 Elevated prostate specific antigen [PSA]: Secondary | ICD-10-CM

## 2010-11-25 MED ORDER — SIMVASTATIN 40 MG PO TABS: ORAL_TABLET | ORAL | Status: DC

## 2010-11-25 NOTE — Assessment & Plan Note (Signed)
Monitored by urology 

## 2010-11-25 NOTE — Patient Instructions (Signed)
Please complete the following lab tests before your next follow up appointment: BMET - 401.9 Lipid panel, LFTs - 272.4 

## 2010-11-25 NOTE — Progress Notes (Signed)
  Subjective:    Patient ID: David [name], male    DOB: [birth year], 70 y.o.   MRN: [id number]  HPI  70 year old white male with history of hypertension, hyperlipidemia and nodular prostate with elevated PSA for routine followup. Overall patient has been doing very well.  He has been staying active.   Patient is tolerating his blood pressure medications. He is currently taking half dose of Azor.  Unfortunately his wife was diagnosed with bilateral pulmonary embolisms this summer. She is anticoagulated and is doing well.  Review of Systems Negative for chest pain, negative for shortness of breath Past Medical History  Diagnosis Date  . Hyperlipidemia   . Hypertension   . Kidney stone 2007  . Family history of hyperlipidemia   . Rectal bleeding   . LBP (low back pain) 06/2005    L5 radicular symptoms; MRI of LS spine severe spondylosis at L4-L5 with central cancal stenosis   . Microscopic hematuria   . Nodular prostate     with BPH on conservative management- Dr. Aldean Ast    History   Social History  . Marital Status: Married    Spouse Name: N/A    Number of Children: N/A  . Years of Education: N/A   Occupational History  . Not on file.   Social History Main Topics  . Smoking status: Former Games developer  . Smokeless tobacco: Not on file  . Alcohol Use: Yes  . Drug Use: No  . Sexually Active: Not on file   Other Topics Concern  . Not on file   Social History Narrative   Married- 48 yrs2 daughters5 grandchildrenNever SmokedAlcohol use-yes (occasional/social)Retired- Paramedic    Past Surgical History  Procedure Date  . Colonoscopy 02/15/2006    Internal hemorrhoids (Dr Jarold Motto)  . Umbilical hernia repair 2011    Dr. Corliss Skains    Family History  Problem Relation Age of Onset  . Cancer Mother     colon  . Pulmonary fibrosis Father     father owned a Diplomatic Services operational officer (father blamed inhalation of chemicals and fumes)  . Coronary artery disease Other   .  Hyperlipidemia Other     Allergies  Allergen Reactions  . Ace Inhibitors     REACTION: Cough    No current outpatient prescriptions on file prior to visit.    BP 132/74  Pulse 64  Temp(Src) 98.7 F (37.1 C) (Oral)  Ht 6' (1.829 m)  Wt 199 lb (90.266 kg)  BMI 26.99 kg/m2       Objective:   Physical Exam   Constitutional: Appears well-developed and well-nourished. No distress.  Head: Normocephalic and atraumatic.  Mouth/Throat: Oropharynx is clear and moist.  Eyes: Conjunctivae are normal. Pupils are equal, round, and reactive to light.  Neck: Normal range of motion. Neck supple. No thyromegaly present. No carotid bruit Cardiovascular: Normal rate, regular rhythm and normal heart sounds.  Exam reveals no gallop and no friction rub.  No lower extremity edema Pulmonary/Chest: Effort normal and breath sounds normal.  No wheezes. No rales.  Psychiatric: Normal mood and affect. Behavior is normal.      Assessment & Plan:

## 2010-11-25 NOTE — Assessment & Plan Note (Signed)
Well controlled.  Continue current medication regimen. Patient advised to monitor blood pressure at home. BP: 132/74 mmHg  Lab Results  Component Value Date   CREATININE 1.1 11/19/2010

## 2010-11-28 ENCOUNTER — Other Ambulatory Visit: Payer: Self-pay | Admitting: Internal Medicine

## 2011-04-27 ENCOUNTER — Encounter: Payer: Self-pay | Admitting: Gastroenterology

## 2011-06-08 ENCOUNTER — Ambulatory Visit (INDEPENDENT_AMBULATORY_CARE_PROVIDER_SITE_OTHER): Payer: Medicare Other | Admitting: Internal Medicine

## 2011-06-08 ENCOUNTER — Encounter: Payer: Self-pay | Admitting: Internal Medicine

## 2011-06-08 VITALS — BP 136/72 | HR 84 | Temp 98.7°F | Ht 72.0 in | Wt 204.0 lb

## 2011-06-08 DIAGNOSIS — R739 Hyperglycemia, unspecified: Secondary | ICD-10-CM | POA: Insufficient documentation

## 2011-06-08 DIAGNOSIS — R7309 Other abnormal glucose: Secondary | ICD-10-CM

## 2011-06-08 DIAGNOSIS — I10 Essential (primary) hypertension: Secondary | ICD-10-CM

## 2011-06-08 LAB — BASIC METABOLIC PANEL
CO2: 25 mEq/L (ref 19–32)
Calcium: 10.1 mg/dL (ref 8.4–10.5)
Creatinine, Ser: 1.1 mg/dL (ref 0.4–1.5)
GFR: 68.86 mL/min (ref >60.00)
Glucose, Bld: 89 mg/dL (ref 70–99)
Sodium: 143 mEq/L (ref 135–145)

## 2011-06-08 NOTE — Assessment & Plan Note (Signed)
Patient has had a mildly elevated fasting blood sugars in the past. Rule out diabetes obtain A1c. Patient understands to avoid sweets and decrease carbohydrate intake.

## 2011-06-08 NOTE — Patient Instructions (Signed)
Please complete the following lab tests before your next follow up appointment: BMET 401.9 LFTs, Lipid panel - 272.4

## 2011-06-08 NOTE — Progress Notes (Signed)
  Subjective:    Patient ID: David [name], male    DOB: 02-04-1941, 71 y.o.   MRN: [id number]  HPI  71 year old white male with history of hypertension and hyperlipidemia for routine followup. Overall patient has been doing very well. Good medication compliance. Patient's blood sugar has been slightly elevated in the past. His wife is concerned that he might have type 2 diabetes. He denies polyuria or polydipsia. He has chronic mild fatigue. He denies family history of type 2 diabetes.  Review of Systems Mild fatigue  Past Medical History  Diagnosis Date  . Hyperlipidemia   . Hypertension   . Kidney stone 2007  . Family history of hyperlipidemia   . Rectal bleeding   . LBP (low back pain) 06/2005    L5 radicular symptoms; MRI of LS spine severe spondylosis at L4-L5 with central cancal stenosis   . Microscopic hematuria   . Nodular prostate     with BPH on conservative management- Dr. Aldean Ast    History   Social History  . Marital Status: Married    Spouse Name: N/A    Number of Children: N/A  . Years of Education: N/A   Occupational History  . Not on file.   Social History Main Topics  . Smoking status: Former Games developer  . Smokeless tobacco: Not on file  . Alcohol Use: Yes  . Drug Use: No  . Sexually Active: Not on file   Other Topics Concern  . Not on file   Social History Narrative   Married- 48 yrs2 daughters5 grandchildrenNever SmokedAlcohol use-yes (occasional/social)Retired- Paramedic    Past Surgical History  Procedure Date  . Colonoscopy 02/15/2006    Internal hemorrhoids (Dr Jarold Motto)  . Umbilical hernia repair 2011    Dr. Corliss Skains    Family History  Problem Relation Age of Onset  . Cancer Mother     colon  . Pulmonary fibrosis Father     father owned a Diplomatic Services operational officer (father blamed inhalation of chemicals and fumes)  . Coronary artery disease Other   . Hyperlipidemia Other     Allergies  Allergen Reactions  . Ace Inhibitors       REACTION: Cough    Current Outpatient Prescriptions on File Prior to Visit  Medication Sig Dispense Refill  . aspirin 81 MG tablet Take 81 mg by mouth daily.        . AZOR 5-20 MG per tablet take 1 tablet by mouth once daily  90 tablet  3  . Multiple Vitamin (MULTIVITAMIN) tablet Take 1 tablet by mouth daily.        . Omega-3 Fatty Acids (FISH OIL) 1200 MG CAPS Take by mouth 2 (two) times daily.        . simvastatin (ZOCOR) 40 MG tablet Take 1/2 tablet by mouth once a day.  90 tablet  1    BP 136/72  Pulse 84  Temp(Src) 98.7 F (37.1 C) (Oral)  Ht 6' (1.829 m)  Wt 204 lb (92.534 kg)  BMI 27.67 kg/m2       Objective:   Physical Exam  Constitutional: He appears well-developed and well-nourished.  Cardiovascular: Normal rate, regular rhythm and normal heart sounds.   Pulmonary/Chest: Effort normal. He has no wheezes. He has no rales.  Musculoskeletal: He exhibits no edema.  Skin: Skin is warm and dry.          Assessment & Plan:

## 2011-06-08 NOTE — Assessment & Plan Note (Signed)
Well controlled.  No change in medication.

## 2011-10-30 DIAGNOSIS — C61 Malignant neoplasm of prostate: Secondary | ICD-10-CM

## 2011-10-30 HISTORY — DX: Malignant neoplasm of prostate: C61

## 2011-11-23 ENCOUNTER — Encounter: Payer: Self-pay | Admitting: Gastroenterology

## 2011-11-23 ENCOUNTER — Ambulatory Visit (INDEPENDENT_AMBULATORY_CARE_PROVIDER_SITE_OTHER): Payer: Medicare Other

## 2011-11-23 DIAGNOSIS — Z23 Encounter for immunization: Secondary | ICD-10-CM

## 2011-11-28 ENCOUNTER — Other Ambulatory Visit: Payer: Self-pay | Admitting: Internal Medicine

## 2011-12-05 HISTORY — PX: PROSTATE BIOPSY: SHX241

## 2012-01-27 ENCOUNTER — Other Ambulatory Visit: Payer: Self-pay | Admitting: Internal Medicine

## 2012-01-27 NOTE — Telephone Encounter (Signed)
Pt last seen 4/10/12013. Pls advise.

## 2012-02-07 NOTE — Telephone Encounter (Signed)
Pt is waiting on azor refill. Pt will be out soon

## 2012-03-05 ENCOUNTER — Ambulatory Visit (INDEPENDENT_AMBULATORY_CARE_PROVIDER_SITE_OTHER): Payer: Medicare Other | Admitting: Internal Medicine

## 2012-03-05 ENCOUNTER — Encounter: Payer: Self-pay | Admitting: Internal Medicine

## 2012-03-05 VITALS — BP 132/80 | Temp 98.4°F | Wt 212.0 lb

## 2012-03-05 DIAGNOSIS — M25552 Pain in left hip: Secondary | ICD-10-CM

## 2012-03-05 DIAGNOSIS — E785 Hyperlipidemia, unspecified: Secondary | ICD-10-CM

## 2012-03-05 DIAGNOSIS — C61 Malignant neoplasm of prostate: Secondary | ICD-10-CM

## 2012-03-05 DIAGNOSIS — M25559 Pain in unspecified hip: Secondary | ICD-10-CM | POA: Insufficient documentation

## 2012-03-05 DIAGNOSIS — I10 Essential (primary) hypertension: Secondary | ICD-10-CM

## 2012-03-05 MED ORDER — AMLODIPINE-OLMESARTAN 5-20 MG PO TABS: 1.0000 | ORAL_TABLET | Freq: Every day | ORAL | Status: DC

## 2012-03-05 MED ORDER — SIMVASTATIN 40 MG PO TABS: 40.0000 mg | ORAL_TABLET | Freq: Every day | ORAL | Status: DC

## 2012-03-05 NOTE — Assessment & Plan Note (Signed)
Patient reports prostate biopsy completed in 3 months ago due to elevated PSA. 3/12 biopsies sites positive for low-grade adenocarcinoma of prostate. Active surveillance as per urologist - Dr. Mena Goes.

## 2012-03-05 NOTE — Assessment & Plan Note (Signed)
72 year old white male with left hip pain. His symptoms consistent with degenerative joint disease/osteoarthritis. Patient plans to followup with his orthopedic physician.  Patient advised to continue using over-the-counter NSAIDs sparingly. He is not experiencing any gastrointestinal side effects. Monitor electrolytes and kidney function.

## 2012-03-05 NOTE — Progress Notes (Signed)
Subjective:    Patient ID: David [name], male    DOB: 1940/06/18, 72 y.o.   MRN: [id number]  HPI  72 year old white male with history of hypertension and elevated PSA for followup. Interval history-patient was seen by urologists approximately 3 months ago. He underwent prostate biopsy due to elevation in PSA. 3 out of 12 biopsy sites tested positive for low-grade prostate cancer.  His urologist is monitoring patient for now.  Patient complains of intermittent left hip pain. This has been going on and off for one month. Pain doesn't wake patient up but it is more symptomatic when he first gets up in the morning. He describes sensation as dull ache. Symptoms slightly improves with movement.  He has been using over-the-counter NSAIDs sparingly.   Review of Systems Negative for fever chills  Past Medical History  Diagnosis Date  . Hyperlipidemia   . Hypertension   . Kidney stone 2007  . Family history of hyperlipidemia   . Rectal bleeding   . LBP (low back pain) 06/2005    L5 radicular symptoms; MRI of LS spine severe spondylosis at L4-L5 with central cancal stenosis   . Microscopic hematuria   . Nodular prostate     with BPH on conservative management- Dr. Aldean Ast    History   Social History  . Marital Status: Married    Spouse Name: N/A    Number of Children: N/A  . Years of Education: N/A   Occupational History  . Not on file.   Social History Main Topics  . Smoking status: Former Games developer  . Smokeless tobacco: Not on file  . Alcohol Use: Yes  . Drug Use: No  . Sexually Active: Not on file   Other Topics Concern  . Not on file   Social History Narrative   Married- 48 yrs2 daughters5 grandchildrenNever SmokedAlcohol use-yes (occasional/social)Retired- Paramedic    Past Surgical History  Procedure Date  . Colonoscopy 02/15/2006    Internal hemorrhoids (Dr Jarold Motto)  . Umbilical hernia repair 2011    Dr. Corliss Skains    Family History  Problem  Relation Age of Onset  . Cancer Mother     colon  . Pulmonary fibrosis Father     father owned a Dipl[employer] Services operational officer (father blamed inhalation of chemicals and fumes)  . Coronary artery disease Other   . Hyperlipidemia Other     Allergies  Allergen Reactions  . Ace Inhibitors     REACTION: Cough    Current Outpatient Prescriptions on File Prior to Visit  Medication Sig Dispense Refill  . amLODipine-olmesartan (AZOR) 5-20 MG per tablet Take 1 tablet by mouth daily.  90 tablet  1  . aspirin 81 MG tablet Take 81 mg by mouth daily.        . Multiple Vitamin (MULTIVITAMIN) tablet Take 1 tablet by mouth daily.        . Omega-3 Fatty Acids (FISH OIL) 1200 MG CAPS Take by mouth 2 (two) times daily.        . simvastatin (ZOCOR) 40 MG tablet Take 1 tablet (40 mg total) by mouth at bedtime.  90 tablet  1    BP 132/80  Temp 98.4 F (36.9 C) (Oral)  Wt 212 lb (96.163 kg)       Objective:   Physical Exam  Constitutional: He appears well-developed and well-nourished.  Cardiovascular: Normal rate, regular rhythm and normal heart sounds.   Pulmonary/Chest: Effort normal. He has no wheezes.  Musculoskeletal:  Left hip tenderness, pain with internal  rotation          Assessment & Plan:

## 2012-03-06 LAB — BASIC METABOLIC PANEL
BUN: 27 mg/dL — ABNORMAL HIGH (ref 6–23)
Calcium: 10.4 mg/dL (ref 8.4–10.5)
GFR: 69.42 mL/min (ref >60.00)
Glucose, Bld: 99 mg/dL (ref 70–99)
Potassium: 4.9 mEq/L (ref 3.5–5.1)
Sodium: 141 mEq/L (ref 135–145)

## 2012-03-06 LAB — HEPATIC FUNCTION PANEL
ALT: 28 U/L (ref 0–53)
Albumin: 4.2 g/dL (ref 3.5–5.2)
Alkaline Phosphatase: 81 U/L (ref 39–117)
Bilirubin, Direct: 0.1 mg/dL (ref 0.0–0.3)
Total Protein: 7.3 g/dL (ref 6.0–8.3)

## 2012-03-06 LAB — LIPID PANEL: Cholesterol: 176 mg/dL (ref 0–200)

## 2012-06-01 ENCOUNTER — Other Ambulatory Visit (HOSPITAL_COMMUNITY): Payer: Self-pay | Admitting: Urology

## 2012-06-01 DIAGNOSIS — C61 Malignant neoplasm of prostate: Secondary | ICD-10-CM

## 2012-06-14 ENCOUNTER — Ambulatory Visit (HOSPITAL_COMMUNITY)
Admission: RE | Admit: 2012-06-14 | Discharge: 2012-06-14 | Disposition: A | Discharge status: Home / Self Care | Payer: Medicare Other | Source: Ambulatory Visit | Attending: Urology | Admitting: Urology

## 2012-06-14 DIAGNOSIS — K4020 Bilateral inguinal hernia, without obstruction or gangrene, not specified as recurrent: Secondary | ICD-10-CM | POA: Insufficient documentation

## 2012-06-14 DIAGNOSIS — N400 Enlarged prostate without lower urinary tract symptoms: Secondary | ICD-10-CM | POA: Insufficient documentation

## 2012-06-14 DIAGNOSIS — C61 Malignant neoplasm of prostate: Secondary | ICD-10-CM | POA: Insufficient documentation

## 2012-06-14 LAB — CREATININE, SERUM
Creatinine, Ser: 0.83 mg/dL (ref 0.50–1.35)
GFR calc Af Amer: >90 mL/min (ref >90)
GFR calc non Af Amer: 86 mL/min — ABNORMAL LOW (ref >90)

## 2012-06-14 MED ORDER — GADOBENATE DIMEGLUMINE 529 MG/ML IV SOLN
20.0000 mL | Freq: Once | INTRAVENOUS | Status: AC | PRN
  Administered 2012-06-14: 20 mL via INTRAVENOUS

## 2012-06-21 ENCOUNTER — Encounter: Payer: Self-pay | Admitting: Gastroenterology

## 2012-07-04 ENCOUNTER — Ambulatory Visit (AMBULATORY_SURGERY_CENTER): Payer: Medicare Other | Admitting: *Deleted

## 2012-07-04 ENCOUNTER — Encounter: Payer: Self-pay | Admitting: Gastroenterology

## 2012-07-04 VITALS — Ht 73.0 in | Wt 215.6 lb

## 2012-07-04 DIAGNOSIS — Z1211 Encounter for screening for malignant neoplasm of colon: Secondary | ICD-10-CM

## 2012-07-04 MED ORDER — MOVIPREP 100 G PO SOLR: ORAL | Status: DC

## 2012-07-18 ENCOUNTER — Ambulatory Visit (AMBULATORY_SURGERY_CENTER): Payer: Medicare Other | Admitting: Gastroenterology

## 2012-07-18 ENCOUNTER — Encounter: Payer: Self-pay | Admitting: Gastroenterology

## 2012-07-18 VITALS — BP 132/70 | HR 60 | Temp 96.4°F | Resp 5 | Ht 73.0 in | Wt 215.0 lb

## 2012-07-18 DIAGNOSIS — Z800 Family history of malignant neoplasm of digestive organs: Secondary | ICD-10-CM

## 2012-07-18 DIAGNOSIS — K649 Unspecified hemorrhoids: Secondary | ICD-10-CM

## 2012-07-18 DIAGNOSIS — Z1211 Encounter for screening for malignant neoplasm of colon: Secondary | ICD-10-CM

## 2012-07-18 MED ORDER — SODIUM CHLORIDE 0.9 % IV SOLN: 500.0000 mL | INTRAVENOUS | Status: DC

## 2012-07-18 NOTE — Op Note (Signed)
[facility] [address] [place], [address]   COLONOSCOPY PROCEDURE REPORT  PATIENT: David [name], David [name]  MR#: [id number] BIRTHDATE: 03/28/1940 , 72  yrs. old GENDER: Male ENDOSCOPIST: Mardella Layman, MD, [facility] REFERRED BY: PROCEDURE DATE:  07/18/2012 PROCEDURE:   Colonoscopy, surveillance ASA CLASS:   Class II INDICATIONS:Patient's immediate family history of colon cancer. MEDICATIONS: propofol (Diprivan) 300mg  IV  DESCRIPTION OF PROCEDURE:   After the risks and benefits and of the procedure were explained, informed consent was obtained.  A digital rectal exam revealed no abnormalities of the rectum.    The LB [id number]  endoscope was introduced through the anus and advanced to the cecum, which was identified by both the appendix and ileocecal valve .  The quality of the prep was poor, using MoviPrep .  The instrument was then slowly withdrawn as the colon was fully examined.     COLON FINDINGS: The colon was redundant.  Manual abdominal counter-pressure was used to reach the cecum.   The colon was otherwise normal.  There was no diverticulosis, inflammation, polyps or cancers unless previously stated.     Retroflexed views revealed internal hemorrhoids.     The scope was then withdrawn from the patient and the procedure completed.  COMPLICATIONS: There were no complications. ENDOSCOPIC IMPRESSION: 1.   The colon was redundant ,poor prep noted 2.   The colon was otherwise normal ..internal hemorrhoids present  RECOMMENDATIONS: Repeat Colonoscopy in 5 years..."double prep" needed.   REPEAT EXAM:  cc:  _______________________________ eSignedMardella Layman, MD, [facility] 07/18/2012 11:16 AM

## 2012-07-18 NOTE — Progress Notes (Signed)
Patient did not experience any of the following events: a burn prior to discharge; a fall within the facility; wrong site/side/patient/procedure/implant event; or a hospital transfer or hospital admission upon discharge from the facility. (G8907) Patient did not have preoperative order for IV antibiotic SSI prophylaxis. (G8918)  

## 2012-07-18 NOTE — Patient Instructions (Addendum)
YOU HAD AN ENDOSCOPIC PROCEDURE TODAY AT THE [facility]: Refer to the procedure report that was given to you for any specific questions about what was found during the examination.  If the procedure report does not answer your questions, please call your gastroenterologist to clarify.  If you requested that your care partner not be given the details of your procedure findings, then the procedure report has been included in a sealed envelope for you to review at your convenience later.  YOU SHOULD EXPECT: Some feelings of bloating in the abdomen. Passage of more gas than usual.  Walking can help get rid of the air that was put into your GI tract during the procedure and reduce the bloating. If you had a lower endoscopy (such as a colonoscopy or flexible sigmoidoscopy) you may notice spotting of blood in your stool or on the toilet paper. If you underwent a bowel prep for your procedure, then you may not have a normal bowel movement for a few days.  DIET: Your first meal following the procedure should be a light meal and then it is ok to progress to your normal diet.  A half-sandwich or bowl of soup is an example of a good first meal.  Heavy or fried foods are harder to digest and may make you feel nauseous or bloated.  Likewise meals heavy in dairy and vegetables can cause extra gas to form and this can also increase the bloating.  Drink plenty of fluids but you should avoid alcoholic beverages for 24 hours.  ACTIVITY: Your care partner should take you home directly after the procedure.  You should plan to take it easy, moving slowly for the rest of the day.  You can resume normal activity the day after the procedure however you should NOT DRIVE or use heavy machinery for 24 hours (because of the sedation medicines used during the test).    SYMPTOMS TO REPORT IMMEDIATELY: A gastroenterologist can be reached at any hour.  During normal business hours, 8:30 AM to 5:00 PM Monday through Friday,  call [phone].  After hours and on weekends, please call the GI answering service at [phone] who will take a message and have the physician on call contact you.   Following lower endoscopy (colonoscopy or flexible sigmoidoscopy):  Excessive amounts of blood in the stool  Significant tenderness or worsening of abdominal pains  Swelling of the abdomen that is new, acute  Fever of 100F or higher Follo FOLLOW UP: If any biopsies were taken you will be contacted by phone or by letter within the next 1-3 weeks.  Call your gastroenterologist if you have not heard about the biopsies in 3 weeks.  Our staff will call the home number listed on your records the next business day following your procedure to check on you and address any questions or concerns that you may have at that time regarding the information given to you following your procedure. This is a courtesy call and so if there is no answer at the home number and we have not heard from you through the emergency physician on call, we will assume that you have returned to your regular daily activities without incident.  SIGNATURES/CONFIDENTIALITY: You and/or your care partner have signed paperwork which will be entered into your electronic medical record.  These signatures attest to the fact that that the information above on your After Visit Summary has been reviewed and is understood.  Full responsibility of the confidentiality of this  discharge information lies with you and/or your care-partner.  Hemorrhoid information given.  Repeat colonoscopy in 5 years-2019

## 2012-07-19 ENCOUNTER — Telehealth: Payer: Self-pay

## 2012-07-19 NOTE — Telephone Encounter (Signed)
  Follow up Call-  Call back number 07/18/2012  Post procedure Call Back phone  # cell [phone]  Permission to leave phone message Yes     Patient questions:  Do you have a fever, pain , or abdominal swelling? no Pain Score  0 *  Have you tolerated food without any problems? yes  Have you been able to return to your normal activities? yes  Do you have any questions about your discharge instructions: Diet   no Medications  no Follow up visit  no  Do you have questions or concerns about your Care? no  Actions: * If pain score is 4 or above: No action needed, pain <4.   No problems per the pt . Maw

## 2012-08-25 ENCOUNTER — Other Ambulatory Visit: Payer: Self-pay | Admitting: Internal Medicine

## 2012-09-10 ENCOUNTER — Other Ambulatory Visit: Payer: Self-pay | Admitting: Dermatology

## 2012-11-23 ENCOUNTER — Other Ambulatory Visit: Payer: Self-pay | Admitting: Internal Medicine

## 2012-11-23 ENCOUNTER — Telehealth: Payer: Self-pay | Admitting: *Deleted

## 2012-11-23 ENCOUNTER — Ambulatory Visit (INDEPENDENT_AMBULATORY_CARE_PROVIDER_SITE_OTHER): Payer: Medicare Other

## 2012-11-23 DIAGNOSIS — Z23 Encounter for immunization: Secondary | ICD-10-CM

## 2012-11-23 MED ORDER — OMEPRAZOLE 20 MG PO CPDR: 20.0000 mg | DELAYED_RELEASE_CAPSULE | Freq: Every day | ORAL | Status: DC

## 2012-11-23 NOTE — Telephone Encounter (Signed)
rx sent electronically. 

## 2012-11-23 NOTE — Telephone Encounter (Signed)
Patient has been taking omeprazole OTC  And it is helping.  He would like to know if he can get a Rx so insurance can help pay for it.

## 2012-12-04 ENCOUNTER — Other Ambulatory Visit: Payer: Self-pay | Admitting: Internal Medicine

## 2012-12-06 ENCOUNTER — Other Ambulatory Visit: Payer: Self-pay | Admitting: Urology

## 2012-12-11 ENCOUNTER — Encounter (HOSPITAL_BASED_OUTPATIENT_CLINIC_OR_DEPARTMENT_OTHER): Payer: Self-pay | Admitting: *Deleted

## 2012-12-11 NOTE — Progress Notes (Signed)
NPO AFTER MN. ARRIVES AT 0900. NEEDS ISTAT AND EKG. WILL DO FLEET ENEMA AM DOS.

## 2012-12-11 NOTE — Progress Notes (Signed)
12/11/12 1158  OBSTRUCTIVE SLEEP APNEA  Have you ever been diagnosed with sleep apnea through a sleep study? No  Do you snore loudly (loud enough to be heard through closed doors)?  1  Do you often feel tired, fatigued, or sleepy during the daytime? 0  Has anyone observed you stop breathing during your sleep? 0  Do you have, or are you being treated for high blood pressure? 1  BMI more than 35 kg/m2? 1  Age over 71 years old? 1  Neck circumference greater than 40 cm/18 inches? 0  Gender: 1  Obstructive Sleep Apnea Score 5  Score 4 or greater  Results sent to PCP

## 2012-12-17 NOTE — H&P (Signed)
History of Present Illness                           F/u Prostate cancer, BPH, elevated PSA, nephrolithiasis, spermatocele and organic impotence.   1- Prostate cancer prior screening, dx and tx: -Mar 2011 PSA 4.1 -Jun 2011 PSA 3.6 -Dec 2011 PSA 4.0 -Jun 2012 PSA 3.9 -Jun 2013 PSA 5.29 -Sep 2013 PSA 6.76 with 16% free -Oct 2013  Low risk PCa T1c 45 g prostate PSA 6.76 Gleason 3+3 = 6, three cores, < 5 - 40% HGPIN and atypia in three cores  -Mar 2014 PSA 6.14 -Apr 2014 MRI prostate normal  -Oct 2014 PSA 7.78   2 - BPH/LUTS. He had a TURP at age 67. He had a voiding cystourethrogram in April 2008 showed a small capacity, hypersensitive, mildly unstable bladder with only mild obstructive symptoms. He previously tried Flomax without success. He also took anticholinergics without benefit. In 2011, nocturia x 2 times a night, but his stream is stable and strong. Does not have much urgency.  -Jun 2013 PVR 3.6 ml.  -Sept 2013 daily Cialis trial - not much change -Oct 2013 tried Enablex not much change -Apr 2014 dicussed Myrbetriq  3- organic impotence - Sept 2013 - He has had trouble getting and maintaining erections for a few years. He tried Cialis and it was effective. He wanted to try daily Cialis for BPH and impotence.   4 - spermatocele - noted on exam Jun 2013. Sept 2013 - Scrotal U/S - I reviewed all the images, see technician sheet for details, findings: The right testicle was 3.91 cm in length, there was a 5 x 8 mm epididymal cyst consistent with a small spermatocele. The right testicle was homogenous and normal in appearance. The left testicle was homogenous and normal in appearance apart from a small 3 mm cyst in the lower pole. The left testicle was smaller at 2.8 cm in length. There was a large hypoechoic area in the epididymal head which was 6 x 3 by 4 cm and multiseptated consistent with likely a large spermatocele.  5 - nephrolithiasis - He passed a small stone about 5 or 6  years ago when he had a CT scan about that time he had some small left renal calculi that have not been symptomatic. KUB - no stones seen in region of KUB. Pelvic phleboliths. Comparison CT 2007.     He returns and has occasional urinary frequency. No dysuria or hematuria. He is more bothered by the left testicle and feels it is getting bigger.       Past Medical History Problems  1. History of  Arthritis V13.4 2. History of  Hypercholesterolemia 272.0 3. History of  Hypertension 401.9 4. History of  Nephrolithiasis V13.01  Surgical History Problems  1. History of  Inguinal Hernia Repair  Current Meds 1. AF-Ibuprofen TABS; Therapy: (Recorded:23Jan2008) to 2. Aspirin Low Strength 81 MG Oral Tablet Chewable; Therapy: (Recorded:30Nov2007) to 3. Azor TABS; Therapy: (Recorded:15Mar2011) to 4. Meloxicam 15 MG Oral Tablet; Therapy: 13Jan2014 to 5. Multi-Vitamin TABS; Therapy: (Recorded:30Nov2007) to 6. Simvastatin 40 MG Oral Tablet; Therapy: 22Jun2010 to  Allergies Medication  1. No Known Drug Allergies  Family History Problems  1. Maternal history of  Colon Cancer V16.0 2. Paternal history of  Death In The Family Father age 47; pulmonary fibrosis 3. Maternal history of  Death In The Family Mother age 88; colon cancer complications 4. Family history of  Family Health Status Number Of Children 2 daughters  Social History Problems  1. Alcohol Use 2-3 per week 2. Caffeine Use 2-3 per day; drink 1/2 caff coffee; decaff diet drinks 3. Family history of  Death In The Family Father 14, pulmonary fibrosis 4. Family history of  Death In The Family Mother 77, colon cancer 5. Former Smoker V15.82 6. Marital History - Currently Married 7. Occupation: retired Denied  8. History of  Tobacco Use  Vitals Vital Signs [Data Includes: Last 1 Day]  08Oct2014 08:04AM  Blood Pressure: 144 / 84 Temperature: 98 F Heart Rate: 65  Physical Exam Constitutional: Well nourished and well  developed . No acute distress.  ENT:. The ears and nose are normal in appearance.  Neck: The appearance of the neck is normal and no neck mass is present.  Pulmonary: No respiratory distress and normal respiratory rhythm and effort.  Cardiovascular: Heart rate and rhythm are normal . No peripheral edema.  Abdomen: The abdomen is soft and nontender. No masses are palpated. No CVA tenderness. No hernias are palpable. No hepatosplenomegaly noted.  Rectal: Rectal exam demonstrates normal sphincter tone, no tenderness and no masses. Estimated prostate size is 1+. The prostate has a palpable nodule involving the right, mid aspect of the prostate, is not indurated and is not tender. The left seminal vesicle is nonpalpable. The right seminal vesicle is nonpalpable. The perineum is normal on inspection.  Genitourinary: Examination of the penis demonstrates no discharge, no masses, no lesions and a normal meatus. The scrotum is without lesions. The right epididymis is palpably normal and non-tender. The left epididymis is palpably normal and non-tender. The right testis is non-tender and without masses. The left testis is non-tender and without masses.  Lymphatics: The femoral and inguinal nodes are not enlarged or tender.  Skin: Normal skin turgor, no visible rash and no visible skin lesions.  Neuro/Psych:. Mood and affect are appropriate.    Results/Data Urine [Data Includes: Last 1 Day]   08Oct2014  COLOR YELLOW   APPEARANCE CLEAR   SPECIFIC GRAVITY 1.025   pH 6.0   GLUCOSE NEG mg/dL  BILIRUBIN NEG   KETONE NEG mg/dL  BLOOD SMALL   PROTEIN NEG mg/dL  UROBILINOGEN 0.2 mg/dL  NITRITE NEG   LEUKOCYTE ESTERASE NEG   SQUAMOUS EPITHELIAL/HPF RARE   WBC 0-2 WBC/hpf  RBC 0-2 RBC/hpf  BACTERIA RARE   CRYSTALS NONE SEEN   CASTS NONE SEEN   Other MUCUS NOTED    The following images/tracing/specimen were independently visualized:  prostate MRI images.    Assessment Assessed  1. Acinar Cell  Carcinoma Of The Prostate Gland 185 2. Spermatocele 608.1 3. Urinary Frequency 788.41  Plan Health Maintenance (V70.0)  1. UA With REFLEX  Done: 08Oct2014 07:52AM Spermatocele (608.1)  2. Follow-up Schedule Surgery Office  Follow-up  Requested for: 08Oct2014  Discussion/Summary    Discussed spermatocele aspiration, surveillance or excision. He wants to proceed with excision and we discussed bleeding, infection, testicular injury/atrophy, recurrence among others. He has varicose veins on scrotum and he feels the weight of the fluid is pushing down and causing friction.   Increasing PSA, possible nodule right mid but subtle. Discussed repeat prostate bx and 2-4% risk of serious infection. I recommend we proceed with repeat bx while under anesthesia and he elects to proceed.     Signatures Electronically signed by : Jerilee Field, M.D.; Dec 05 2012  8:39AM

## 2012-12-18 ENCOUNTER — Other Ambulatory Visit: Payer: Self-pay

## 2012-12-18 ENCOUNTER — Ambulatory Visit (HOSPITAL_BASED_OUTPATIENT_CLINIC_OR_DEPARTMENT_OTHER): Payer: Medicare Other | Admitting: Anesthesiology

## 2012-12-18 ENCOUNTER — Encounter (HOSPITAL_BASED_OUTPATIENT_CLINIC_OR_DEPARTMENT_OTHER)
Admission: RE | Disposition: A | Discharge status: Home / Self Care | Payer: Self-pay | Source: Ambulatory Visit | Attending: Urology

## 2012-12-18 ENCOUNTER — Encounter (HOSPITAL_BASED_OUTPATIENT_CLINIC_OR_DEPARTMENT_OTHER): Payer: Medicare Other | Admitting: Anesthesiology

## 2012-12-18 ENCOUNTER — Encounter (HOSPITAL_BASED_OUTPATIENT_CLINIC_OR_DEPARTMENT_OTHER): Payer: Self-pay | Admitting: Anesthesiology

## 2012-12-18 ENCOUNTER — Ambulatory Visit (HOSPITAL_BASED_OUTPATIENT_CLINIC_OR_DEPARTMENT_OTHER)
Admission: RE | Admit: 2012-12-18 | Discharge: 2012-12-18 | Disposition: A | Discharge status: Home / Self Care | Payer: Medicare Other | Source: Ambulatory Visit | Attending: Urology | Admitting: Urology

## 2012-12-18 DIAGNOSIS — C61 Malignant neoplasm of prostate: Secondary | ICD-10-CM | POA: Insufficient documentation

## 2012-12-18 DIAGNOSIS — N400 Enlarged prostate without lower urinary tract symptoms: Secondary | ICD-10-CM | POA: Insufficient documentation

## 2012-12-18 DIAGNOSIS — R351 Nocturia: Secondary | ICD-10-CM | POA: Insufficient documentation

## 2012-12-18 DIAGNOSIS — E7800 Pure hypercholesterolemia, unspecified: Secondary | ICD-10-CM | POA: Insufficient documentation

## 2012-12-18 DIAGNOSIS — Z7982 Long term (current) use of aspirin: Secondary | ICD-10-CM | POA: Insufficient documentation

## 2012-12-18 DIAGNOSIS — I10 Essential (primary) hypertension: Secondary | ICD-10-CM | POA: Insufficient documentation

## 2012-12-18 DIAGNOSIS — N4340 Spermatocele of epididymis, unspecified: Secondary | ICD-10-CM | POA: Insufficient documentation

## 2012-12-18 DIAGNOSIS — N529 Male erectile dysfunction, unspecified: Secondary | ICD-10-CM | POA: Insufficient documentation

## 2012-12-18 DIAGNOSIS — N200 Calculus of kidney: Secondary | ICD-10-CM | POA: Insufficient documentation

## 2012-12-18 HISTORY — PX: SPERMATOCELECTOMY: SHX2420

## 2012-12-18 HISTORY — DX: Unspecified osteoarthritis, unspecified site: M19.90

## 2012-12-18 HISTORY — DX: Nocturia: R35.1

## 2012-12-18 HISTORY — DX: Benign prostatic hyperplasia without lower urinary tract symptoms: N40.0

## 2012-12-18 HISTORY — DX: Spermatocele of epididymis, unspecified: N43.40

## 2012-12-18 HISTORY — DX: Gastro-esophageal reflux disease without esophagitis: K21.9

## 2012-12-18 HISTORY — DX: Male erectile dysfunction, unspecified: N52.9

## 2012-12-18 HISTORY — DX: Presence of spectacles and contact lenses: Z97.3

## 2012-12-18 HISTORY — DX: Personal history of urinary calculi: Z87.442

## 2012-12-18 HISTORY — PX: PROSTATE BIOPSY: SHX241

## 2012-12-18 LAB — POCT I-STAT 4, (NA,K, GLUC, HGB,HCT)
HCT: 46 % (ref 39.0–52.0)
Hemoglobin: 15.6 g/dL (ref 13.0–17.0)
Potassium: 4.2 mEq/L (ref 3.5–5.1)
Sodium: 145 mEq/L (ref 135–145)

## 2012-12-18 SURGERY — EXCISION, SPERMATOCELE
Anesthesia: General | Site: Scrotum | Wound class: Clean

## 2012-12-18 MED ORDER — BUPIVACAINE HCL (PF) 0.25 % IJ SOLN
INTRAMUSCULAR | Status: DC | PRN
  Administered 2012-12-18: 10 mL

## 2012-12-18 MED ORDER — PROPOFOL 10 MG/ML IV BOLUS
INTRAVENOUS | Status: DC | PRN
  Administered 2012-12-18: 200 mg via INTRAVENOUS

## 2012-12-18 MED ORDER — FENTANYL CITRATE 0.05 MG/ML IJ SOLN
25.0000 ug | INTRAMUSCULAR | Status: DC | PRN
  Filled 2012-12-18: qty 1

## 2012-12-18 MED ORDER — OXYCODONE-ACETAMINOPHEN 5-325 MG PO TABS
1.0000 | ORAL_TABLET | Freq: Four times a day (QID) | ORAL | Status: DC | PRN
  Administered 2012-12-18: 1 via ORAL
  Filled 2012-12-18: qty 1

## 2012-12-18 MED ORDER — LIDOCAINE HCL (CARDIAC) 20 MG/ML IV SOLN
INTRAVENOUS | Status: DC | PRN
  Administered 2012-12-18: 70 mg via INTRAVENOUS

## 2012-12-18 MED ORDER — LEVOFLOXACIN 500 MG PO TABS: 500.0000 mg | ORAL_TABLET | Freq: Every day | ORAL | Status: DC

## 2012-12-18 MED ORDER — LACTATED RINGERS IV SOLN
INTRAVENOUS | Status: DC
  Filled 2012-12-18: qty 1000

## 2012-12-18 MED ORDER — LACTATED RINGERS IV SOLN
INTRAVENOUS | Status: DC
  Administered 2012-12-18 (×2): via INTRAVENOUS
  Filled 2012-12-18: qty 1000

## 2012-12-18 MED ORDER — MIDAZOLAM HCL 5 MG/5ML IJ SOLN
INTRAMUSCULAR | Status: DC | PRN
  Administered 2012-12-18: 2 mg via INTRAVENOUS

## 2012-12-18 MED ORDER — CEFAZOLIN SODIUM 1-5 GM-% IV SOLN
1.0000 g | INTRAVENOUS | Status: AC
  Administered 2012-12-18: 2 g via INTRAVENOUS
  Filled 2012-12-18: qty 50

## 2012-12-18 MED ORDER — ONDANSETRON HCL 4 MG/2ML IJ SOLN
INTRAMUSCULAR | Status: DC | PRN
  Administered 2012-12-18: 4 mg via INTRAVENOUS

## 2012-12-18 MED ORDER — LIDOCAINE HCL 2 % IJ SOLN
INTRAMUSCULAR | Status: DC | PRN
  Administered 2012-12-18: 10 mL

## 2012-12-18 MED ORDER — DEXAMETHASONE SODIUM PHOSPHATE 4 MG/ML IJ SOLN
INTRAMUSCULAR | Status: DC | PRN
  Administered 2012-12-18: 10 mg via INTRAVENOUS

## 2012-12-18 MED ORDER — FENTANYL CITRATE 0.05 MG/ML IJ SOLN
INTRAMUSCULAR | Status: DC | PRN
  Administered 2012-12-18 (×2): 50 ug via INTRAVENOUS
  Administered 2012-12-18 (×2): 25 ug via INTRAVENOUS
  Administered 2012-12-18: 50 ug via INTRAVENOUS

## 2012-12-18 MED ORDER — OXYCODONE-ACETAMINOPHEN 5-325 MG PO TABS: 1.0000 | ORAL_TABLET | ORAL | Status: DC | PRN

## 2012-12-18 MED ORDER — ASPIRIN 81 MG PO TABS: 81.0000 mg | ORAL_TABLET | Freq: Every day | ORAL | Status: DC

## 2012-12-18 SURGICAL SUPPLY — 47 items
BANDAGE GAUZE ELAST BULKY 4 IN (GAUZE/BANDAGES/DRESSINGS) ×3 IMPLANT
BLADE SURG 15 STRL LF DISP TIS (BLADE) ×2 IMPLANT
BLADE SURG 15 STRL SS (BLADE) ×3
BLADE SURG ROTATE 9660 (MISCELLANEOUS) IMPLANT
CANISTER SUCTION 1200CC (MISCELLANEOUS) IMPLANT
CANISTER SUCTION 2500CC (MISCELLANEOUS) ×1 IMPLANT
CLEANER CAUTERY TIP 5X5 PAD (MISCELLANEOUS) IMPLANT
CLOTH BEACON ORANGE TIMEOUT ST (SAFETY) ×3 IMPLANT
COVER MAYO STAND STRL (DRAPES) ×3 IMPLANT
COVER TABLE BACK 60X90 (DRAPES) ×3 IMPLANT
DISSECTOR ROUND CHERRY 3/8 STR (MISCELLANEOUS) IMPLANT
DRAIN PENROSE 18X1/4 LTX STRL (WOUND CARE) IMPLANT
DRAPE PED LAPAROTOMY (DRAPES) ×3 IMPLANT
DRESSING TELFA 8X3 (GAUZE/BANDAGES/DRESSINGS) ×3 IMPLANT
ELECT NDL TIP 2.8 STRL (NEEDLE) IMPLANT
ELECT NEEDLE TIP 2.8 STRL (NEEDLE) IMPLANT
ELECT REM PT RETURN 9FT ADLT (ELECTROSURGICAL) ×3
ELECTRODE REM PT RTRN 9FT ADLT (ELECTROSURGICAL) IMPLANT
GAUZE SPONGE 4X4 12PLY STRL LF (GAUZE/BANDAGES/DRESSINGS) ×1 IMPLANT
GLOVE BIO SURGEON STRL SZ 6.5 (GLOVE) ×1 IMPLANT
GLOVE BIO SURGEON STRL SZ8 (GLOVE) ×3 IMPLANT
GLOVE INDICATOR 7.0 STRL GRN (GLOVE) ×1 IMPLANT
GLOVE SS BIOGEL STRL SZ 6 (GLOVE) IMPLANT
GLOVE SS BIOGEL STRL SZ 6.5 (GLOVE) IMPLANT
GLOVE SUPERSENSE BIOGEL SZ 6 (GLOVE) ×1
GLOVE SUPERSENSE BIOGEL SZ 6.5 (GLOVE) ×1
GLOVE SURG SS PI 8.0 STRL IVOR (GLOVE) ×1 IMPLANT
GOWN PREVENTION PLUS LG XLONG (DISPOSABLE) ×4 IMPLANT
GOWN STRL REIN XL XLG (GOWN DISPOSABLE) ×3 IMPLANT
NEEDLE HYPO 22GX1.5 SAFETY (NEEDLE) IMPLANT
NS IRRIG 500ML POUR BTL (IV SOLUTION) ×2 IMPLANT
PACK BASIN DAY SURGERY FS (CUSTOM PROCEDURE TRAY) ×3 IMPLANT
PAD CLEANER CAUTERY TIP 5X5 (MISCELLANEOUS) ×1
PENCIL BUTTON HOLSTER BLD 10FT (ELECTRODE) ×3 IMPLANT
SUPPORT SCROTAL LG STRP (MISCELLANEOUS) ×3 IMPLANT
SURGILUBE 2OZ TUBE FLIPTOP (MISCELLANEOUS) ×3 IMPLANT
SUT CHROMIC 3 0 SH 27 (SUTURE) ×3 IMPLANT
SUT CHROMIC 4 0 SH 27 (SUTURE) ×3 IMPLANT
SUT VIC AB 2-0 SH 27 (SUTURE) ×6
SUT VIC AB 2-0 SH 27X BRD (SUTURE) IMPLANT
SYR CONTROL 10ML LL (SYRINGE) ×1 IMPLANT
TOWEL OR 17X24 6PK STRL BLUE (TOWEL DISPOSABLE) ×6 IMPLANT
TRAY DSU PREP LF (CUSTOM PROCEDURE TRAY) ×3 IMPLANT
TUBE CONNECTING 12X1/4 (SUCTIONS) ×3 IMPLANT
UNDERPAD 30X30 INCONTINENT (UNDERPADS AND DIAPERS) ×3 IMPLANT
WATER STERILE IRR 500ML POUR (IV SOLUTION) ×3 IMPLANT
YANKAUER SUCT BULB TIP NO VENT (SUCTIONS) ×3 IMPLANT

## 2012-12-18 NOTE — Op Note (Addendum)
Preop diagnosis: Prostate cancer, left spermatocele  postoperative diagnosis: Same  Procedure: Exam under anesthesia Transrectal ultrasound of prostate Prostate biopsy - standard 12 core Ultrasound guidance for biopsy Left spermatocelectomy  Surgeon: Mena Goes Anesthesia: [place]  Type of anesthesia: Gen., local block  Findings: On exam under anesthesia the prostate exhibited mild BPH on digital rectal exam. The prostate was smooth apart from the right apex which was indurated and somewhat nodular.  Transrectal ultrasound and prostate: The seminal vesicles appeared normal. The prostate itself appeared normal without any hypoechoic areas or any evidence of extraprostatic extension. At the right apex there is a line calcifications likely explaining the palpable abnormality. This area was biopsied. Height 3.15 cm Weight 5.48 cm Length 4.56 cm  41.23  g prostate  On scrotal exploration there was a large left spermatocele with 3 smaller spermatoceles under the tail of the epididymis.  Description of procedure: After consent was obtained the patient was brought to the operating room. After adequate anesthesia he was placed in left lateral decubitus. Digital rectal exam was performed followed by insertion of the transrectal ultrasound probe. A local block of 10 cc was instilled. Ultrasound of the prostate was performed. Standard 12 core biopsy was performed. There was minimal blood loss and no complications from this portion of the procedure.The ultrasound probe was removed.  Patient was then placed supine in the external genitalia prepped and draped in the usual fashion. A second timeout was performed to confirm the patient and procedure. A transverse left scrotal incision was made in the dartos fascia dissected down to the tunica vaginalis. The tunica vaginalis was opened and a the testicle and spermatoceles were delivered. Away from the testicle I opened the tunica vaginalis running along the  epididymis and dissected off the tunica vaginalis from the large dominant cyst. This large spermatocele was dissected off the cord and down toward detail of the epididymis. It was dissected to its origin and transected and removed. There were 3 smaller cyst up under the tail of the epididymis and with careful dissection these to were traced back to their origins and removed taking care to remove all the spermatocele lining to prevent recurrence. Hemostasis was excellent. The epididymis was rather long and floppy there was reapproximated to the tunica vaginalis to close the dead space with interrupted 3-0 Vicryl suture. The testicle was irrigated and placed back into the left hemiscrotum without torsion. It should be noted the testicle was palpably normal without hard area or mass. The tunica vaginalis was then run closed with a 3-0 Vicryl suture. The dartos fascia was run closed with a 3-0 Vicryl suture. The skin was closed with a running horizontal mattress of 3-0 chromic. I performed an in and out cath to drain the bladder of about 300 cc. The incision was cleaned and a sterile dressing of fluffs gauze and a jock strap were placed. The patient was awakened and taken to recovery room in stable condition.  Complications: None Blood loss: Minimal Drains: None  Specimens: #1 - 12 standard 12 core biopsy right and left base mid and apex x2 each from lateral to medial #13 spermatocele  Disposition: Patient stable to PACU.

## 2012-12-18 NOTE — Anesthesia Postprocedure Evaluation (Signed)
  Anesthesia Post-op Note  Patient: David [name]  Procedure(s) Performed: Procedure(s) (LRB): LEFT SPERMATOCELECTOMY (Left) BIOPSY TRANSRECTAL ULTRASONIC PROSTATE (TUBP) (N/A)  Patient Location: PACU  Anesthesia Type: General  Level of Consciousness: awake and alert   Airway and Oxygen Therapy: Patient Spontanous Breathing  Post-op Pain: mild  Post-op Assessment: Post-op Vital signs reviewed, Patient's Cardiovascular Status Stable, Respiratory Function Stable, Patent Airway and No signs of Nausea or vomiting  Last Vitals:  Filed Vitals:   12/18/12 1300  BP: 123/71  Pulse: 64  Temp:   Resp: 18    Post-op Vital Signs: stable   Complications: No apparent anesthesia complications

## 2012-12-18 NOTE — Anesthesia Procedure Notes (Addendum)
Procedure Name: LMA Insertion Date/Time: 12/18/2012 10:59 AM Performed by: Norva Pavlov Pre-anesthesia Checklist: Patient identified, Emergency Drugs available, Suction available and Patient being monitored Patient Re-evaluated:Patient Re-evaluated prior to inductionOxygen Delivery Method: Circle System Utilized Preoxygenation: Pre-oxygenation with 100% oxygen Intubation Type: IV induction Ventilation: Mask ventilation without difficulty LMA: LMA inserted LMA Size: 5.0 Number of attempts: 1 Airway Equipment and Method: bite block Placement Confirmation: positive ETCO2 Tube secured with: Tape Dental Injury: Teeth and Oropharynx as per pre-operative assessment

## 2012-12-18 NOTE — Transfer of Care (Signed)
Immediate Anesthesia Transfer of Care Note  Patient: David [name]  Procedure(s) Performed: Procedure(s) (LRB): LEFT SPERMATOCELECTOMY (Left) BIOPSY TRANSRECTAL ULTRASONIC PROSTATE (TUBP) (N/A)  Patient Location: PACU  Anesthesia Type: General  Level of Consciousness: awake, alert  and oriented  Airway & Oxygen Therapy: Patient Spontanous Breathing and Patient connected to face mask oxygen  Post-op Assessment: Report given to PACU RN and Post -op Vital signs reviewed and stable  Post vital signs: Reviewed and stable  Complications: No apparent anesthesia complications

## 2012-12-18 NOTE — Anesthesia Preprocedure Evaluation (Addendum)
Anesthesia Evaluation  Patient identified by MRN, date of birth, ID band Patient awake    Reviewed: Allergy & Precautions, H&P , NPO status , Patient's Chart, lab work & pertinent test results  Airway Mallampati: II TM Distance: >3 FB Neck ROM: full    Dental no notable dental hx. (+) Teeth Intact and Dental Advisory Given   Pulmonary neg pulmonary ROS,  breath sounds clear to auscultation  Pulmonary exam normal       Cardiovascular Exercise Tolerance: Good hypertension, Pt. on medications Rhythm:regular Rate:Normal  RBBB   Neuro/Psych negative neurological ROS  negative psych ROS   GI/Hepatic negative GI ROS, Neg liver ROS, GERD-  Medicated and Controlled,  Endo/Other  negative endocrine ROS  Renal/GU negative Renal ROS  negative genitourinary   Musculoskeletal   Abdominal   Peds  Hematology negative hematology ROS (+)   Anesthesia Other Findings   Reproductive/Obstetrics negative OB ROS                          Anesthesia Physical Anesthesia Plan  ASA: II  Anesthesia Plan: General   Post-op Pain Management:    Induction: Intravenous  Airway Management Planned: LMA  Additional Equipment:   Intra-op Plan:   Post-operative Plan:   Informed Consent: I have reviewed the patients History and Physical, chart, labs and discussed the procedure including the risks, benefits and alternatives for the proposed anesthesia with the patient or authorized representative who has indicated his/her understanding and acceptance.   Dental Advisory Given  Plan Discussed with: CRNA and Surgeon  Anesthesia Plan Comments:         Anesthesia Quick Evaluation

## 2012-12-18 NOTE — Interval H&P Note (Signed)
History and Physical Interval Note:  12/18/2012 10:51 AM  David [name]  has presented today for surgery, with the diagnosis of LEFT SPERMATOCELE PROSTATE CANCER   The various methods of treatment have been discussed with the patient and family. After consideration of risks, benefits and other options for treatment, the patient has consented to  Procedure(s): LEFT SPERMATOCELECTOMY (Left) BIOPSY TRANSRECTAL ULTRASONIC PROSTATE (TUBP) (N/A) as a surgical intervention .  The patient's history has been reviewed, patient examined, no change in status, stable for surgery.  I have reviewed the patient's chart and labs.  Questions were answered to the patient's satisfaction.  He has had no dysuria, hematuria, fever, CP or SOB.    Antony Haste

## 2012-12-24 ENCOUNTER — Encounter (HOSPITAL_BASED_OUTPATIENT_CLINIC_OR_DEPARTMENT_OTHER): Payer: Self-pay | Admitting: Urology

## 2013-01-01 ENCOUNTER — Encounter: Payer: Self-pay | Admitting: Radiation Oncology

## 2013-01-01 NOTE — Progress Notes (Signed)
GU Location of Tumor / Histology: prostate  If Prostate Cancer, Gleason Score is (3 + 4) and PSA is (7.78 on 11/29/12)  Patient presented 12 months ago with signs/symptoms of: rising PSA  Biopsies of prostate (if applicable) revealed:  12/18/12  adenocarcinoma, 4/12 cores, Gleason 3+4=7, volume 41.23 gm  12/05/11   Adenocarcinoma, 3/12 cores, Gleason 3+3=6, vol 45 gm  Past/Anticipated interventions by urology, if any: surveillance, prostate biopsies x 2  Past/Anticipated interventions by medical oncology, if any: none  Weight changes, if any: no  Bowel/Bladder complaints, if any: urinary frequency, stream starts/stops, urgency, weak stream, nocturia x 3, IPSS 16  Nausea/Vomiting, if any: no  Pain issues, if any:  Left hip from arthritis  SAFETY ISSUES:  Prior radiation? No  Pacemaker/ICD? no  Possible current pregnancy? na  Is the patient on methotrexate? no  Current Complaints / other details:  Retired from [employer] in 2007, married, 2 daughters

## 2013-01-03 ENCOUNTER — Ambulatory Visit
Admission: RE | Admit: 2013-01-03 | Discharge: 2013-01-03 | Disposition: A | Discharge status: Home / Self Care | Payer: Medicare Other | Source: Ambulatory Visit | Attending: Radiation Oncology | Admitting: Radiation Oncology

## 2013-01-03 ENCOUNTER — Encounter: Payer: Self-pay | Admitting: Radiation Oncology

## 2013-01-03 VITALS — BP 136/69 | HR 60 | Temp 98.6°F | Resp 20 | Ht 72.0 in | Wt 213.0 lb

## 2013-01-03 DIAGNOSIS — Z79899 Other long term (current) drug therapy: Secondary | ICD-10-CM | POA: Insufficient documentation

## 2013-01-03 DIAGNOSIS — E785 Hyperlipidemia, unspecified: Secondary | ICD-10-CM | POA: Insufficient documentation

## 2013-01-03 DIAGNOSIS — I10 Essential (primary) hypertension: Secondary | ICD-10-CM | POA: Insufficient documentation

## 2013-01-03 DIAGNOSIS — K219 Gastro-esophageal reflux disease without esophagitis: Secondary | ICD-10-CM | POA: Insufficient documentation

## 2013-01-03 DIAGNOSIS — C61 Malignant neoplasm of prostate: Secondary | ICD-10-CM | POA: Insufficient documentation

## 2013-01-03 NOTE — Progress Notes (Addendum)
[facility] Radiation Oncology NEW PATIENT EVALUATION  Name: David [name] MRN: [id number]  Date:   01/03/2013           DOB: 1940/07/02  Status: outpatient   CC: Thomos Lemons, DO  Marlowe Sax* , Dr. Dwana Curd   REFERRING PHYSICIAN: Marlowe Sax*   DIAGNOSIS: Stage TI C. intermediate risk adenocarcinoma prostate   HISTORY OF PRESENT ILLNESS:  David [name] is a 72 y.o. male who is seen today for the courtesy of Dr. Mena Goes for evaluation of his stage TI C. intermediate risk adenocarcinoma prostate. His PSA in June of 2012 was 3.9. This rose to 5.29 in June of 2013 and then 6.75 by September 2013 with 18% free. He underwent ultrasound-guided biopsies on 12/05/2011 was found have 3 cores and Gleason 6 (3+3) involving less than 5% of one core from right lateral base, 40% of one core from right lateral apex and less than 5% of one core from left apex. He elected for close surveillance. His PSA rose to 7.78 on 11/28/2012 and he underwent repeat biopsies with Dr. Mena Goes. He was found to have Gleason 7 (3+4) involving 20% of one core from the right lateral apex and 5% of one core from the right medial apex. He also had Gleason 6 (3+3) involving 20% of one core from the right lateral mid gland and 20% of one core from the left medial apex. His gland volume was 41.2 cc. He does have what appears to  irritative symptoms with an I PSS score 16. He did not improve with an alpha blocker, or anticholinergics. I believe that he's had urodynamics in the past. In April 2008 he was found to have a "small bladder capacity" which was hypersensitive with only mild obstructive symptoms. No GI difficulties. He does have erectile dysfunction.  PREVIOUS RADIATION THERAPY: No   PAST MEDICAL HISTORY:  has a past medical history of Hyperlipidemia; Hypertension; LBP (low back pain) (06/2005); History of kidney stones; Spermatocele; BPH (benign prostatic hypertrophy); Organic  impotence; GERD (gastroesophageal reflux disease); Nocturia; Arthritis; Wears glasses; Prostate cancer (10/2011); and RBBB.     PAST SURGICAL HISTORY:  Past Surgical History  Procedure Laterality Date  . Colonoscopy  02/15/2006    Internal hemorrhoids (Dr Jarold Motto)  . Repair umbilical and ventral hernia's w/ mesh  08-21-2009  . Transurethral resection of prostate  AGE 44  . Tonsillectomy and adenoidectomy  as child  . Inguinal hernia repair Right 1980  . Spermatocelectomy Left 12/18/2012    Procedure: LEFT SPERMATOCELECTOMY;  Surgeon: Antony Haste, MD;  Location: [facility];  Service: Urology;  Laterality: Left;  . Prostate biopsy N/A 12/18/2012    Procedure: BIOPSY TRANSRECTAL ULTRASONIC PROSTATE (TUBP);  Surgeon: Antony Haste, MD;  Location: [facility];  Service: Urology;  Laterality: N/A;  . Prostate biopsy  12/05/11    gleason 6, 3/12 cores     FAMILY HISTORY: family history includes Cancer in his mother; Colon cancer (age of onset: 52) in his mother; Coronary artery disease in his other; Hyperlipidemia in his other; Pulmonary fibrosis in his father. His father died from pulmonary fibrosis at 38. His mother died from metastatic colon cancer at 13. No family history of prostate cancer.   SOCIAL HISTORY:  reports that he quit smoking about 32 years ago. He has never used smokeless tobacco. He reports that he drinks about 1.2 ounces of alcohol per week. He reports that he does not use  illicit drugs. Married, 2 children. 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   ALLERGIES: Ace inhibitors and Percocet   MEDICATIONS:  Current Outpatient Prescriptions  Medication Sig Dispense Refill  . amLODipine-olmesartan (AZOR) 5-20 MG per tablet take 1 tablet by mouth once daily--  takes in evening      . aspirin 81 MG tablet Take 1 tablet (81 mg total) by mouth daily.  30 tablet  0  . Multiple Vitamin (MULTIVITAMIN) tablet Take 1 tablet by  mouth daily.        [name] omeprazole (PRILOSEC) 20 MG capsule Take 20 mg by mouth every evening.      . simvastatin (ZOCOR) 40 MG tablet take 1 tablet by mouth at bedtime  90 tablet  1   No current facility-administered medications for this encounter.     REVIEW OF SYSTEMS:  Pertinent items are noted in HPI.    PHYSICAL EXAM:  height is 6' (1.829 m) and weight is 213 lb (96.616 kg). His oral temperature is 98.6 F (37 C). His blood pressure is 136/69 and his pulse is 60. His respiration is 20.   Alert and oriented 72 year old white male appearing his stated age. Head and neck examination: Grossly unremarkable. Nodes: Without palpable cervical or supraclavicular lymphadenopathy. Chest: Lungs clear. Back: Without spinal or CVA tenderness. Abdomen: Without masses organomegaly. Genitalia: Unremarkable to inspection. Rectal: The prostate gland is normal to palpation and is without focal induration or nodularity. Extremities: Without edema.   LABORATORY DATA:  Lab Results  Component Value Date   WBC 6.8 08/18/2009   HGB 15.6 12/18/2012   HCT 46.0 12/18/2012   MCV 95.6 08/18/2009   PLT 204 08/18/2009   Lab Results  Component Value Date   NA 145 12/18/2012   K 4.2 12/18/2012   CL 110 03/05/2012   CO2 27 03/05/2012   Lab Results  Component Value Date   ALT 28 03/05/2012   AST 24 03/05/2012   ALKPHOS 81 03/05/2012   BILITOT 0.9 03/05/2012   PSA 7.78 from 11/28/2012   IMPRESSION: Stage TI C. intermediate risk adenocarcinoma prostate. I explained to the patient and his wife that his prognosis is related to his stage PSA level, and Gleason score. His stage and PSA level are favorable while his Gleason score of 7 is of intermediate favorability. His management options include surgery versus continued close observation/surveillance, or radiation therapy. In view of his obstructive symptom score, I do not recommend seed implantation as monotherapy, or as a boost. His other radiation therapy option is external  beam/IMRT. There would be a slight risk of worsening of his irritative symptoms, but this could be minimized if he can be treated with at least 150 cc of urine in his bladder (comfortably full bladder). We discussed the potential acute and late toxicities of external beam/IMRT. All of his questions were answered. He'll speak with Dr. Sebastian Ache early next week and discuss robotic surgery in detail.   PLAN: As discussed above .   I spent 60 minutes minutes face to face with the patient and more than 50% of that time was spent in counseling and/or coordination of care.

## 2013-01-03 NOTE — Progress Notes (Signed)
Please see the Nurse Progress Note in the MD Initial Consult Encounter for this patient. 

## 2013-01-04 NOTE — Addendum Note (Signed)
Encounter addended by: Saga Balthazar Mintz Regla Fitzgibbon, RN on: 01/04/2013  7:27 PM<BR>     Documentation filed: Charges VN

## 2013-01-04 NOTE — Addendum Note (Signed)
Encounter addended by: Delynn Flavin, RN on: 01/04/2013  6:23 PM<BR>     Documentation filed: Charges VN

## 2013-01-08 ENCOUNTER — Telehealth: Payer: Self-pay | Admitting: *Deleted

## 2013-01-08 NOTE — Telephone Encounter (Signed)
appt scheduled

## 2013-01-08 NOTE — Telephone Encounter (Signed)
Message copied by Jacqualyn Posey on Tue Jan 08, 2013 11:42 AM ------      Message from: Meda Coffee      Created: Tue Jan 08, 2013 10:21 AM       Call pt - I suggest OV to discuss treatment options for prostate cancer.            He should keep his appt with surgeon.            RY ------

## 2013-01-11 ENCOUNTER — Encounter: Payer: Self-pay | Admitting: Internal Medicine

## 2013-01-11 ENCOUNTER — Ambulatory Visit (INDEPENDENT_AMBULATORY_CARE_PROVIDER_SITE_OTHER): Payer: Medicare Other | Admitting: Internal Medicine

## 2013-01-11 VITALS — BP 138/70 | Temp 98.5°F | Ht 72.0 in | Wt 215.0 lb

## 2013-01-11 DIAGNOSIS — C61 Malignant neoplasm of prostate: Secondary | ICD-10-CM

## 2013-01-11 DIAGNOSIS — R351 Nocturia: Secondary | ICD-10-CM

## 2013-01-11 DIAGNOSIS — N529 Male erectile dysfunction, unspecified: Secondary | ICD-10-CM

## 2013-01-11 DIAGNOSIS — I10 Essential (primary) hypertension: Secondary | ICD-10-CM

## 2013-01-11 MED ORDER — MIRABEGRON ER 25 MG PO TB24: 25.0000 mg | ORAL_TABLET | Freq: Every day | ORAL | Status: DC

## 2013-01-11 NOTE — Assessment & Plan Note (Signed)
Managed by urologist.  He has yet to try Viagra 100 mg 1/2 to 1 tab qd prn.

## 2013-01-11 NOTE — Patient Instructions (Signed)
Contact our office re: response to Myrbetriq within 1 month Please complete the following lab tests before your next follow up appointment: BMET - 401.9 FLP, LFTs, TSH - 272.4

## 2013-01-11 NOTE — Assessment & Plan Note (Signed)
Well controlled 

## 2013-01-11 NOTE — Progress Notes (Signed)
Subjective:    Patient ID: David [name], male    DOB: 06/28/1940, 72 y.o.   MRN: [id number]  HPI  72 year old white male with hx of hypertension, hyperlipidemia and elevated prostate cancer for follow up.  Patient seen by urologist who noted rising PSA.  This led to repeat biopsy.  Gleason score of 7.  Patient now considering his options of robotic radical prostatatectomy vs external beam radiation.    Patient had ventral hernia repair by Dr. Corliss Skains 3-4 years ago.  He may have multiple adhesions which may complicate surgery.  Patient also noted to have small volume bladder which may complicate radiation therapy.    He experiences frequent nocturia.  Hypertension - stable.   Review of Systems Negative for chest pain,   he follows active lifestyle    Past Medical History  Diagnosis Date  . Hyperlipidemia   . Hypertension   . LBP (low back pain) 06/2005    L5 radicular symptoms; MRI of LS spine severe spondylosis at L4-L5 with central cancal stenosis   . History of kidney stones     2007  . Spermatocele   . BPH (benign prostatic hypertrophy)   . Organic impotence   . GERD (gastroesophageal reflux disease)   . Nocturia   . Arthritis     gen.  and left hip  . Wears glasses   . Prostate cancer 10/2011    (Low grade) [facility] - Dr. Mena Goes  . RBBB     History   Social History  . Marital Status: Married    Spouse Name: N/A    Number of Children: N/A  . Years of Education: N/A   Occupational History  . Not on file.   Social History Main Topics  . Smoking status: Former Smoker -- 2 years    Quit date: 02/29/1980  . Smokeless tobacco: Never Used  . Alcohol Use: 1.2 oz/week    2 Cans of beer per week  . Drug Use: No  . Sexual Activity: Not on file   Other Topics Concern  . Not on file   Social History Narrative   Married- 48 yrs   2 daughters   5 grandchildren   Never Smoked   Alcohol use-yes (occasional/social)   Retired- Paramedic     Past Surgical History  Procedure Laterality Date  . Colonoscopy  02/15/2006    Internal hemorrhoids (Dr Jarold Motto)  . Repair umbilical and ventral hernia's w/ mesh  08-21-2009  . Transurethral resection of prostate  AGE 65  . Tonsillectomy and adenoidectomy  as child  . Inguinal hernia repair Right 1980  . Spermatocelectomy Left 12/18/2012    Procedure: LEFT SPERMATOCELECTOMY;  Surgeon: Antony Haste, MD;  Location: [facility];  Service: Urology;  Laterality: Left;  . Prostate biopsy N/A 12/18/2012    Procedure: BIOPSY TRANSRECTAL ULTRASONIC PROSTATE (TUBP);  Surgeon: Antony Haste, MD;  Location: [facility];  Service: Urology;  Laterality: N/A;  . Prostate biopsy  12/05/11    gleason 6, 3/12 cores    Family History  Problem Relation Age of Onset  . Cancer Mother     colon  . Colon cancer Mother 41  . Pulmonary fibrosis Father     father owned a [employer] operational officer (father blamed inhalation of chemicals and fumes)  . Coronary artery disease Other   . Hyperlipidemia Other     Allergies  Allergen Reactions  . Ace Inhibitors Other (See  Comments)    REACTION: Cough  . Percocet [Oxycodone-Acetaminophen]     Severe hiccups    Current Outpatient Prescriptions on File Prior to Visit  Medication Sig Dispense Refill  . amLODipine-olmesartan (AZOR) 5-20 MG per tablet take 1 tablet by mouth once daily--  takes in evening      . aspirin 81 MG tablet Take 1 tablet (81 mg total) by mouth daily.  30 tablet  0  . Multiple Vitamin (MULTIVITAMIN) tablet Take 1 tablet by mouth daily.        [name] omeprazole (PRILOSEC) 20 MG capsule Take 20 mg by mouth every evening.      . simvastatin (ZOCOR) 40 MG tablet take 1 tablet by mouth at bedtime  90 tablet  1   No current facility-administered medications on file prior to visit.    BP 138/70  Temp(Src) 98.5 F (36.9 C) (Oral)  Ht 6' (1.829 m)  Wt 215 lb (97.523 kg)  BMI 29.15  kg/m2    Objective:   Physical Exam  Constitutional: He is oriented to person, place, and time. He appears well-developed and well-nourished. No distress.  HENT:  Head: Normocephalic and atraumatic.  Neck: Neck supple.  Cardiovascular: Normal rate, regular rhythm and normal heart sounds.   No murmur heard. Pulmonary/Chest: Effort normal and breath sounds normal. He has no wheezes.  Musculoskeletal: He exhibits no edema.  Neurological: He is alert and oriented to person, place, and time. No cranial nerve deficit.  Skin: Skin is warm and dry.  Psychiatric: He has a normal mood and affect. His behavior is normal.          Assessment & Plan:

## 2013-01-11 NOTE — Assessment & Plan Note (Signed)
He has tried Detrol LA and NIKE w/o improvement.  Trial of Myrbetriq.  Samples provided.

## 2013-01-11 NOTE — Assessment & Plan Note (Signed)
Surveillance PSA trending upward.  Gleason scores higher 7.  Patient still weighing both options of robotic radical prostatectomy vs external beam radiation.  We discussed at length pros and cons of each therapy.

## 2013-01-11 NOTE — Progress Notes (Signed)
Pre visit review using our clinic review tool, if applicable. No additional management support is needed unless otherwise documented below in the visit note. 

## 2013-01-23 ENCOUNTER — Other Ambulatory Visit: Payer: Self-pay | Admitting: Internal Medicine

## 2013-01-23 DIAGNOSIS — C641 Malignant neoplasm of right kidney, except renal pelvis: Secondary | ICD-10-CM

## 2013-01-23 DIAGNOSIS — D3001 Benign neoplasm of right kidney: Secondary | ICD-10-CM | POA: Insufficient documentation

## 2013-01-23 HISTORY — DX: Benign neoplasm of right kidney: D30.01

## 2013-01-23 NOTE — Assessment & Plan Note (Signed)
Refer to Dr. Myna Hidalgo re: discuss treatment options for both renal cell carcinoma and prostate cancer.

## 2013-01-29 ENCOUNTER — Other Ambulatory Visit: Payer: Self-pay | Admitting: Urology

## 2013-02-12 ENCOUNTER — Encounter (HOSPITAL_COMMUNITY): Payer: Self-pay | Admitting: Pharmacy Technician

## 2013-02-12 ENCOUNTER — Encounter (HOSPITAL_COMMUNITY)
Admission: RE | Admit: 2013-02-12 | Discharge: 2013-02-12 | Disposition: A | Discharge status: Home / Self Care | Payer: Medicare Other | Source: Ambulatory Visit | Attending: Urology | Admitting: Urology

## 2013-02-12 ENCOUNTER — Ambulatory Visit (HOSPITAL_COMMUNITY)
Admission: RE | Admit: 2013-02-12 | Discharge: 2013-02-12 | Disposition: A | Discharge status: Home / Self Care | Payer: Medicare Other | Source: Ambulatory Visit | Attending: Urology | Admitting: Urology

## 2013-02-12 ENCOUNTER — Encounter (HOSPITAL_COMMUNITY): Payer: Self-pay

## 2013-02-12 DIAGNOSIS — Z01812 Encounter for preprocedural laboratory examination: Secondary | ICD-10-CM | POA: Insufficient documentation

## 2013-02-12 DIAGNOSIS — C61 Malignant neoplasm of prostate: Secondary | ICD-10-CM | POA: Insufficient documentation

## 2013-02-12 DIAGNOSIS — I10 Essential (primary) hypertension: Secondary | ICD-10-CM | POA: Insufficient documentation

## 2013-02-12 DIAGNOSIS — Z01818 Encounter for other preprocedural examination: Secondary | ICD-10-CM | POA: Insufficient documentation

## 2013-02-12 HISTORY — DX: Neoplasm of unspecified behavior of unspecified kidney: D49.519

## 2013-02-12 LAB — BASIC METABOLIC PANEL
Calcium: 10.9 mg/dL — ABNORMAL HIGH (ref 8.4–10.5)
Creatinine, Ser: 0.96 mg/dL (ref 0.50–1.35)
GFR calc Af Amer: >90 mL/min (ref >90)
GFR calc non Af Amer: 81 mL/min — ABNORMAL LOW (ref >90)
Sodium: 139 mEq/L (ref 135–145)

## 2013-02-12 LAB — CBC
MCH: 31.5 pg (ref 26.0–34.0)
MCHC: 34.2 g/dL (ref 30.0–36.0)
MCV: 92.1 fL (ref 78.0–100.0)
Platelets: 202 10*3/uL (ref 150–400)
RDW: 13.4 % (ref 11.5–15.5)
WBC: 7.8 10*3/uL (ref 4.0–10.5)

## 2013-02-12 NOTE — Progress Notes (Signed)
EKG 12/20/12 on EPIC

## 2013-02-12 NOTE — Patient Instructions (Signed)
20 David [name]  02/12/2013   Your procedure is scheduled on: 02/15/13  Report to [facility] at 5:15 AM.  Call this number if you have problems the morning of surgery [phone]   Remember: please follow bowel prep instructions   Do not eat food or drink liquids After Midnight.     Take these medicines the morning of surgery with A SIP OF WATER: ocean spray if needed   Do not wear jewelry, make-up or nail polish.  Do not wear lotions, powders, or perfumes. You may wear deodorant.  Do not shave 48 hours prior to surgery. Men may shave face and neck.  Do not bring valuables to the hospital.  Contacts, dentures or bridgework may not be worn into surgery.  Leave suitcase in the car. After surgery it may be brought to your room.  For patients admitted to the hospital, checkout time is 11:00 AM the day of discharge.   Please read over the following fact sheets that you were given: blood fact sheet Birdie Sons, RN  pre op nurse call if needed [phone]    FAILURE TO FOLLOW THESE INSTRUCTIONS MAY RESULT IN CANCELLATION OF YOUR SURGERY   Patient Signature: ___________________________________________

## 2013-02-15 ENCOUNTER — Encounter (HOSPITAL_COMMUNITY)
Admission: RE | Disposition: A | Discharge status: Home / Self Care | Payer: Self-pay | Source: Ambulatory Visit | Attending: Urology

## 2013-02-15 ENCOUNTER — Inpatient Hospital Stay (HOSPITAL_COMMUNITY)
Admission: RE | Admit: 2013-02-15 | Discharge: 2013-02-19 | DRG: 707 | Disposition: A | Discharge status: Home / Self Care | Payer: Medicare Other | Source: Ambulatory Visit | Attending: Urology | Admitting: Urology

## 2013-02-15 ENCOUNTER — Encounter (HOSPITAL_COMMUNITY): Payer: Self-pay | Admitting: *Deleted

## 2013-02-15 ENCOUNTER — Inpatient Hospital Stay (HOSPITAL_COMMUNITY): Payer: Medicare Other | Admitting: Anesthesiology

## 2013-02-15 ENCOUNTER — Encounter (HOSPITAL_COMMUNITY): Payer: Medicare Other | Admitting: Anesthesiology

## 2013-02-15 DIAGNOSIS — I10 Essential (primary) hypertension: Secondary | ICD-10-CM | POA: Diagnosis present

## 2013-02-15 DIAGNOSIS — K560 Paralytic ileus: Secondary | ICD-10-CM | POA: Diagnosis not present

## 2013-02-15 DIAGNOSIS — Z800 Family history of malignant neoplasm of digestive organs: Secondary | ICD-10-CM

## 2013-02-15 DIAGNOSIS — Z87442 Personal history of urinary calculi: Secondary | ICD-10-CM

## 2013-02-15 DIAGNOSIS — R351 Nocturia: Secondary | ICD-10-CM | POA: Diagnosis present

## 2013-02-15 DIAGNOSIS — Z9889 Other specified postprocedural states: Secondary | ICD-10-CM

## 2013-02-15 DIAGNOSIS — D3000 Benign neoplasm of unspecified kidney: Secondary | ICD-10-CM | POA: Diagnosis present

## 2013-02-15 DIAGNOSIS — K219 Gastro-esophageal reflux disease without esophagitis: Secondary | ICD-10-CM | POA: Diagnosis present

## 2013-02-15 DIAGNOSIS — N529 Male erectile dysfunction, unspecified: Secondary | ICD-10-CM | POA: Diagnosis present

## 2013-02-15 DIAGNOSIS — Z7982 Long term (current) use of aspirin: Secondary | ICD-10-CM

## 2013-02-15 DIAGNOSIS — Z87891 Personal history of nicotine dependence: Secondary | ICD-10-CM

## 2013-02-15 DIAGNOSIS — K660 Peritoneal adhesions (postprocedural) (postinfection): Secondary | ICD-10-CM | POA: Diagnosis present

## 2013-02-15 DIAGNOSIS — C61 Malignant neoplasm of prostate: Principal | ICD-10-CM | POA: Diagnosis present

## 2013-02-15 DIAGNOSIS — E785 Hyperlipidemia, unspecified: Secondary | ICD-10-CM | POA: Diagnosis present

## 2013-02-15 HISTORY — PX: ROBOTIC ASSITED PARTIAL NEPHRECTOMY: SHX6087

## 2013-02-15 HISTORY — PX: LYMPHADENECTOMY: SHX5960

## 2013-02-15 HISTORY — PX: ROBOT ASSISTED LAPAROSCOPIC RADICAL PROSTATECTOMY: SHX5141

## 2013-02-15 LAB — BASIC METABOLIC PANEL
BUN: 21 mg/dL (ref 6–23)
Calcium: 9.4 mg/dL (ref 8.4–10.5)
GFR calc Af Amer: 55 mL/min — ABNORMAL LOW (ref >90)
GFR calc non Af Amer: 48 mL/min — ABNORMAL LOW (ref >90)
Potassium: 4.7 mEq/L (ref 3.5–5.1)
Sodium: 138 mEq/L (ref 135–145)

## 2013-02-15 LAB — HEMOGLOBIN AND HEMATOCRIT, BLOOD: Hemoglobin: 14.9 g/dL (ref 13.0–17.0)

## 2013-02-15 LAB — TYPE AND SCREEN

## 2013-02-15 SURGERY — ROBOTIC ASSITED PARTIAL NEPHRECTOMY
Anesthesia: General | Laterality: Right

## 2013-02-15 MED ORDER — SENNA 8.6 MG PO TABS
1.0000 | ORAL_TABLET | Freq: Two times a day (BID) | ORAL | Status: DC
  Administered 2013-02-15 – 2013-02-18 (×7): 8.6 mg via ORAL
  Filled 2013-02-15 (×7): qty 1

## 2013-02-15 MED ORDER — SUCCINYLCHOLINE CHLORIDE 20 MG/ML IJ SOLN
INTRAMUSCULAR | Status: DC | PRN
  Administered 2013-02-15: 100 mg via INTRAVENOUS

## 2013-02-15 MED ORDER — PHENYLEPHRINE HCL 10 MG/ML IJ SOLN
INTRAMUSCULAR | Status: DC | PRN
  Administered 2013-02-15 (×2): 120 ug via INTRAVENOUS

## 2013-02-15 MED ORDER — GLYCOPYRROLATE 0.2 MG/ML IJ SOLN
INTRAMUSCULAR | Status: AC
  Filled 2013-02-15: qty 4

## 2013-02-15 MED ORDER — OXYCODONE HCL 5 MG PO TABS: 5.0000 mg | ORAL_TABLET | ORAL | Status: DC | PRN

## 2013-02-15 MED ORDER — SODIUM CHLORIDE 0.9 % IV SOLN
INTRAVENOUS | Status: DC | PRN
  Administered 2013-02-15: 15:00:00 via INTRAVENOUS

## 2013-02-15 MED ORDER — DOCUSATE SODIUM 100 MG PO CAPS
100.0000 mg | ORAL_CAPSULE | Freq: Two times a day (BID) | ORAL | Status: DC
  Administered 2013-02-15 – 2013-02-18 (×7): 100 mg via ORAL
  Filled 2013-02-15 (×9): qty 1

## 2013-02-15 MED ORDER — DEXAMETHASONE SODIUM PHOSPHATE 10 MG/ML IJ SOLN
INTRAMUSCULAR | Status: AC
  Filled 2013-02-15: qty 1

## 2013-02-15 MED ORDER — FENTANYL CITRATE 0.05 MG/ML IJ SOLN
INTRAMUSCULAR | Status: DC | PRN
  Administered 2013-02-15: 50 ug via INTRAVENOUS
  Administered 2013-02-15: 100 ug via INTRAVENOUS
  Administered 2013-02-15: 50 ug via INTRAVENOUS
  Administered 2013-02-15: 100 ug via INTRAVENOUS
  Administered 2013-02-15: 50 ug via INTRAVENOUS

## 2013-02-15 MED ORDER — NEOSTIGMINE METHYLSULFATE 1 MG/ML IJ SOLN
INTRAMUSCULAR | Status: DC | PRN
  Administered 2013-02-15: 5 mg via INTRAVENOUS

## 2013-02-15 MED ORDER — IRBESARTAN 150 MG PO TABS
150.0000 mg | ORAL_TABLET | Freq: Every evening | ORAL | Status: DC
  Administered 2013-02-15 – 2013-02-18 (×4): 150 mg via ORAL
  Filled 2013-02-15 (×5): qty 1

## 2013-02-15 MED ORDER — PIPERACILLIN-TAZOBACTAM 3.375 G IVPB
INTRAVENOUS | Status: AC
  Filled 2013-02-15: qty 50

## 2013-02-15 MED ORDER — MEPERIDINE HCL 50 MG/ML IJ SOLN: 6.2500 mg | INTRAMUSCULAR | Status: DC | PRN

## 2013-02-15 MED ORDER — PROPOFOL 10 MG/ML IV BOLUS
INTRAVENOUS | Status: DC | PRN
  Administered 2013-02-15: 200 mg via INTRAVENOUS

## 2013-02-15 MED ORDER — ONDANSETRON HCL 4 MG/2ML IJ SOLN
INTRAMUSCULAR | Status: DC | PRN
  Administered 2013-02-15: 4 mg via INTRAVENOUS

## 2013-02-15 MED ORDER — AMLODIPINE BESYLATE 5 MG PO TABS
5.0000 mg | ORAL_TABLET | Freq: Every evening | ORAL | Status: DC
  Administered 2013-02-15 – 2013-02-18 (×4): 5 mg via ORAL
  Filled 2013-02-15 (×5): qty 1

## 2013-02-15 MED ORDER — KCL IN DEXTROSE-NACL 20-5-0.45 MEQ/L-%-% IV SOLN
INTRAVENOUS | Status: AC
  Filled 2013-02-15: qty 1000

## 2013-02-15 MED ORDER — EPHEDRINE SULFATE 50 MG/ML IJ SOLN
INTRAMUSCULAR | Status: DC | PRN
  Administered 2013-02-15: 15 mg via INTRAVENOUS

## 2013-02-15 MED ORDER — LACTATED RINGERS IV SOLN: INTRAVENOUS | Status: DC

## 2013-02-15 MED ORDER — CISATRACURIUM BESYLATE 20 MG/10ML IV SOLN
INTRAVENOUS | Status: AC
  Filled 2013-02-15: qty 20

## 2013-02-15 MED ORDER — SODIUM CHLORIDE 0.9 % IJ SOLN
INTRAMUSCULAR | Status: AC
  Filled 2013-02-15: qty 10

## 2013-02-15 MED ORDER — PIPERACILLIN-TAZOBACTAM 3.375 G IVPB 30 MIN
3.3750 g | Freq: Once | INTRAVENOUS | Status: AC
  Administered 2013-02-15: 3.375 g via INTRAVENOUS

## 2013-02-15 MED ORDER — SODIUM CHLORIDE 0.9 % IJ SOLN
INTRAMUSCULAR | Status: AC
  Filled 2013-02-15: qty 20

## 2013-02-15 MED ORDER — PANTOPRAZOLE SODIUM 40 MG PO TBEC
40.0000 mg | DELAYED_RELEASE_TABLET | Freq: Every day | ORAL | Status: DC
  Administered 2013-02-15 – 2013-02-18 (×4): 40 mg via ORAL
  Filled 2013-02-15 (×5): qty 1

## 2013-02-15 MED ORDER — EPHEDRINE SULFATE 50 MG/ML IJ SOLN
INTRAMUSCULAR | Status: AC
  Filled 2013-02-15: qty 1

## 2013-02-15 MED ORDER — LACTATED RINGERS IV SOLN: INTRAVENOUS | Status: DC | PRN

## 2013-02-15 MED ORDER — DEXAMETHASONE SODIUM PHOSPHATE 10 MG/ML IJ SOLN
INTRAMUSCULAR | Status: DC | PRN
  Administered 2013-02-15: 10 mg via INTRAVENOUS

## 2013-02-15 MED ORDER — FENTANYL CITRATE 0.05 MG/ML IJ SOLN
INTRAMUSCULAR | Status: AC
  Filled 2013-02-15: qty 5

## 2013-02-15 MED ORDER — PIPERACILLIN-TAZOBACTAM 3.375 G IVPB 30 MIN
3.3750 g | Freq: Once | INTRAVENOUS | Status: AC
  Administered 2013-02-15: 3.375 g via INTRAVENOUS
  Filled 2013-02-15: qty 50

## 2013-02-15 MED ORDER — LACTATED RINGERS IV SOLN
INTRAVENOUS | Status: DC | PRN
  Administered 2013-02-15 (×2): via INTRAVENOUS

## 2013-02-15 MED ORDER — ONDANSETRON HCL 4 MG/2ML IJ SOLN: 4.0000 mg | INTRAMUSCULAR | Status: DC | PRN

## 2013-02-15 MED ORDER — PROPOFOL 10 MG/ML IV BOLUS
INTRAVENOUS | Status: AC
  Filled 2013-02-15: qty 20

## 2013-02-15 MED ORDER — ATORVASTATIN CALCIUM 20 MG PO TABS
20.0000 mg | ORAL_TABLET | Freq: Every day | ORAL | Status: DC
  Administered 2013-02-15 – 2013-02-18 (×4): 20 mg via ORAL
  Filled 2013-02-15 (×5): qty 1

## 2013-02-15 MED ORDER — GLYCOPYRROLATE 0.2 MG/ML IJ SOLN
INTRAMUSCULAR | Status: DC | PRN
  Administered 2013-02-15: .8 mg via INTRAVENOUS

## 2013-02-15 MED ORDER — HYDROMORPHONE HCL PF 1 MG/ML IJ SOLN
0.5000 mg | INTRAMUSCULAR | Status: DC | PRN
  Administered 2013-02-16: 1 mg via INTRAVENOUS
  Filled 2013-02-15: qty 1

## 2013-02-15 MED ORDER — NEOSTIGMINE METHYLSULFATE 1 MG/ML IJ SOLN
INTRAMUSCULAR | Status: AC
  Filled 2013-02-15: qty 10

## 2013-02-15 MED ORDER — AMLODIPINE-OLMESARTAN 5-20 MG PO TABS: 1.0000 | ORAL_TABLET | Freq: Every evening | ORAL | Status: DC

## 2013-02-15 MED ORDER — ACETAMINOPHEN 500 MG PO TABS
1000.0000 mg | ORAL_TABLET | Freq: Four times a day (QID) | ORAL | Status: AC
  Administered 2013-02-15 – 2013-02-16 (×4): 1000 mg via ORAL
  Filled 2013-02-15 (×4): qty 2

## 2013-02-15 MED ORDER — PHENYLEPHRINE 40 MCG/ML (10ML) SYRINGE FOR IV PUSH (FOR BLOOD PRESSURE SUPPORT)
PREFILLED_SYRINGE | INTRAVENOUS | Status: AC
  Filled 2013-02-15: qty 10

## 2013-02-15 MED ORDER — PROMETHAZINE HCL 25 MG/ML IJ SOLN: 6.2500 mg | INTRAMUSCULAR | Status: DC | PRN

## 2013-02-15 MED ORDER — SUCCINYLCHOLINE CHLORIDE 20 MG/ML IJ SOLN
INTRAMUSCULAR | Status: AC
  Filled 2013-02-15: qty 1

## 2013-02-15 MED ORDER — KCL IN DEXTROSE-NACL 20-5-0.45 MEQ/L-%-% IV SOLN
INTRAVENOUS | Status: DC
  Administered 2013-02-15 – 2013-02-17 (×5): via INTRAVENOUS
  Administered 2013-02-17: 18:00:00 1000 mL via INTRAVENOUS
  Administered 2013-02-18: 01:00:00 via INTRAVENOUS
  Filled 2013-02-15 (×7): qty 1000

## 2013-02-15 MED ORDER — ONDANSETRON HCL 4 MG/2ML IJ SOLN
INTRAMUSCULAR | Status: AC
  Filled 2013-02-15: qty 2

## 2013-02-15 MED ORDER — LACTATED RINGERS IR SOLN
Status: DC | PRN
  Administered 2013-02-15: 1000 mL

## 2013-02-15 MED ORDER — CISATRACURIUM BESYLATE (PF) 10 MG/5ML IV SOLN
INTRAVENOUS | Status: DC | PRN
  Administered 2013-02-15: 6 mg via INTRAVENOUS
  Administered 2013-02-15 (×3): 4 mg via INTRAVENOUS
  Administered 2013-02-15: 6 mg via INTRAVENOUS
  Administered 2013-02-15 (×3): 4 mg via INTRAVENOUS

## 2013-02-15 MED ORDER — INDOCYANINE GREEN 25 MG IV SOLR
INTRAVENOUS | Status: DC | PRN
  Administered 2013-02-15: 12.5 mg via INTRAVENOUS

## 2013-02-15 MED ORDER — SIMVASTATIN 40 MG PO TABS: 40.0000 mg | ORAL_TABLET | Freq: Every evening | ORAL | Status: DC

## 2013-02-15 MED ORDER — MANNITOL 25 % IV SOLN
25.0000 g | Freq: Once | INTRAVENOUS | Status: AC
  Administered 2013-02-15: 25 g via INTRAVENOUS
  Filled 2013-02-15: qty 100

## 2013-02-15 MED ORDER — FENTANYL CITRATE 0.05 MG/ML IJ SOLN
INTRAMUSCULAR | Status: AC
  Filled 2013-02-15: qty 2

## 2013-02-15 MED ORDER — FENTANYL CITRATE 0.05 MG/ML IJ SOLN
25.0000 ug | INTRAMUSCULAR | Status: DC | PRN
  Administered 2013-02-15: 50 ug via INTRAVENOUS
  Administered 2013-02-15 (×2): 25 ug via INTRAVENOUS

## 2013-02-15 MED ORDER — BUPIVACAINE LIPOSOME 1.3 % IJ SUSP
20.0000 mL | Freq: Once | INTRAMUSCULAR | Status: AC
  Administered 2013-02-15: 40 mL
  Filled 2013-02-15: qty 20

## 2013-02-15 SURGICAL SUPPLY — 90 items
ADH SKN CLS APL DERMABOND .7 (GAUZE/BANDAGES/DRESSINGS) ×6
APL ESCP 34 STRL LF DISP (HEMOSTASIS) ×3
APPLICATOR SURGIFLO ENDO (HEMOSTASIS) ×4 IMPLANT
BAG SPEC RTRVL LRG 6X4 10 (ENDOMECHANICALS) ×6
CABLE HIGH FREQUENCY MONO STRZ (ELECTRODE) ×5 IMPLANT
CANISTER SUCTION 2500CC (MISCELLANEOUS) ×4 IMPLANT
CATH FOLEY 2WAY SLVR 18FR 30CC (CATHETERS) ×4 IMPLANT
CATH TIEMANN FOLEY 18FR 5CC (CATHETERS) ×4 IMPLANT
CHLORAPREP W/TINT 26ML (MISCELLANEOUS) ×5 IMPLANT
CLIP LIGATING HEM O LOK PURPLE (MISCELLANEOUS) ×12 IMPLANT
CLIP LIGATING HEMO LOK XL GOLD (MISCELLANEOUS) ×8 IMPLANT
CLIP LIGATING HEMO O LOK GREEN (MISCELLANEOUS) ×4 IMPLANT
CONT SPECI 4OZ STER CLIK (MISCELLANEOUS) ×4 IMPLANT
CORDS BIPOLAR (ELECTRODE) ×5 IMPLANT
COVER SURGICAL LIGHT HANDLE (MISCELLANEOUS) ×5 IMPLANT
COVER TIP SHEARS 8 DVNC (MISCELLANEOUS) ×3 IMPLANT
COVER TIP SHEARS 8MM DA VINCI (MISCELLANEOUS) ×1
CUTTER ECHEON FLEX ENDO 45 340 (ENDOMECHANICALS) ×4 IMPLANT
CUTTER FLEX LINEAR 45M (STAPLE) ×4 IMPLANT
DECANTER SPIKE VIAL GLASS SM (MISCELLANEOUS) ×4 IMPLANT
DERMABOND ADVANCED (GAUZE/BANDAGES/DRESSINGS) ×2
DERMABOND ADVANCED .7 DNX12 (GAUZE/BANDAGES/DRESSINGS) ×3 IMPLANT
DRAIN CHANNEL 15F RND FF 3/16 (WOUND CARE) ×4 IMPLANT
DRAPE INCISE IOBAN 66X45 STRL (DRAPES) ×6 IMPLANT
DRAPE LAPAROSCOPIC ABDOMINAL (DRAPES) ×4 IMPLANT
DRAPE LG THREE QUARTER DISP (DRAPES) ×9 IMPLANT
DRAPE SURG IRRIG POUCH 19X23 (DRAPES) ×4 IMPLANT
DRAPE TABLE BACK 44X90 PK DISP (DRAPES) ×4 IMPLANT
DRAPE UTILITY XL STRL (DRAPES) ×2 IMPLANT
DRAPE WARM FLUID 44X44 (DRAPE) ×4 IMPLANT
DRSG TEGADERM 2-3/8X2-3/4 SM (GAUZE/BANDAGES/DRESSINGS) ×16 IMPLANT
DRSG TEGADERM 4X4.75 (GAUZE/BANDAGES/DRESSINGS) ×8 IMPLANT
DRSG TEGADERM 6X8 (GAUZE/BANDAGES/DRESSINGS) ×8 IMPLANT
ELECT REM PT RETURN 9FT ADLT (ELECTROSURGICAL) ×8
ELECTRODE REM PT RTRN 9FT ADLT (ELECTROSURGICAL) ×6 IMPLANT
EVACUATOR SILICONE 100CC (DRAIN) ×4 IMPLANT
GAUZE SPONGE 2X2 8PLY STRL LF (GAUZE/BANDAGES/DRESSINGS) ×3 IMPLANT
GLOVE BIO SURGEON STRL SZ 6.5 (GLOVE) ×4 IMPLANT
GLOVE BIOGEL M STRL SZ7.5 (GLOVE) ×12 IMPLANT
GOWN PREVENTION PLUS LG XLONG (DISPOSABLE) ×12 IMPLANT
GOWN STRL REIN XL XLG (GOWN DISPOSABLE) ×8 IMPLANT
HOLDER FOLEY CATH W/STRAP (MISCELLANEOUS) ×4 IMPLANT
IV LACTATED RINGERS 1000ML (IV SOLUTION) ×4 IMPLANT
KIT ACCESSORY DA VINCI DISP (KITS) ×1
KIT ACCESSORY DVNC DISP (KITS) ×3 IMPLANT
KIT BASIN OR (CUSTOM PROCEDURE TRAY) ×3 IMPLANT
KIT PROCEDURE DA VINCI SI (MISCELLANEOUS) ×1
KIT PROCEDURE DVNC SI (MISCELLANEOUS) ×3 IMPLANT
LOOP VESSEL MAXI BLUE (MISCELLANEOUS) ×4 IMPLANT
NDL INSUFFLATION 14GA 120MM (NEEDLE) ×3 IMPLANT
NEEDLE INSUFFLATION 14GA 120MM (NEEDLE) ×4 IMPLANT
NEEDLE SPNL 22GX7 SPINOC (NEEDLE) ×4 IMPLANT
NS IRRIG 1000ML POUR BTL (IV SOLUTION) ×4 IMPLANT
PACK ROBOT UROLOGY CUSTOM (CUSTOM PROCEDURE TRAY) ×4 IMPLANT
PENCIL BUTTON HOLSTER BLD 10FT (ELECTRODE) ×5 IMPLANT
POSITIONER SURGICAL ARM (MISCELLANEOUS) ×8 IMPLANT
POUCH SPECIMEN RETRIEVAL 10MM (ENDOMECHANICALS) ×5 IMPLANT
RELOAD 45 VASCULAR/THIN (ENDOMECHANICALS) ×12 IMPLANT
RELOAD GREEN ECHELON 45 (STAPLE) ×4 IMPLANT
RELOAD STAPLE 45 2.5 WHT GRN (ENDOMECHANICALS) ×9 IMPLANT
SET TUBE IRRIG SUCTION NO TIP (IRRIGATION / IRRIGATOR) ×5 IMPLANT
SOLUTION ANTI FOG 6CC (MISCELLANEOUS) ×4 IMPLANT
SOLUTION ELECTROLUBE (MISCELLANEOUS) ×4 IMPLANT
SPONGE GAUZE 2X2 STER 10/PKG (GAUZE/BANDAGES/DRESSINGS) ×1
SPONGE LAP 4X18 X RAY DECT (DISPOSABLE) ×4 IMPLANT
SURGIFLO W/THROMBIN 8M KIT (HEMOSTASIS) ×4 IMPLANT
SUT ETHILON 3 0 PS 1 (SUTURE) ×4 IMPLANT
SUT MNCRL AB 4-0 PS2 18 (SUTURE) ×9 IMPLANT
SUT PDS AB 1 CT1 27 (SUTURE) ×8 IMPLANT
SUT PROLENE 0 CT 1 30 (SUTURE) ×2 IMPLANT
SUT V-LOC BARB 180 2/0GR6 GS22 (SUTURE) ×4
SUT V-LOC BARB 180 2/0GR9 GS23 (SUTURE)
SUT VIC AB 3-0 SH 27 (SUTURE) ×4
SUT VIC AB 3-0 SH 27X BRD (SUTURE) IMPLANT
SUT VICRYL 0 UR6 27IN ABS (SUTURE) ×4 IMPLANT
SUT VLOC BARB 180 ABS3/0GR12 (SUTURE) ×4
SUTURE V-LC BRB 180 2/0GR6GS22 (SUTURE) ×3 IMPLANT
SUTURE V-LC BRB 180 2/0GR9GS23 (SUTURE) IMPLANT
SUTURE VLOC BRB 180 ABS3/0GR12 (SUTURE) ×3 IMPLANT
SYR 27GX1/2 1ML LL SAFETY (SYRINGE) ×4 IMPLANT
SYR BULB IRRIGATION 50ML (SYRINGE) IMPLANT
TOWEL OR 17X26 10 PK STRL BLUE (TOWEL DISPOSABLE) ×8 IMPLANT
TOWEL OR NON WOVEN STRL DISP B (DISPOSABLE) ×8 IMPLANT
TRAY FOLEY CATH 14FRSI W/METER (CATHETERS) ×4 IMPLANT
TRAY LAP CHOLE (CUSTOM PROCEDURE TRAY) ×3 IMPLANT
TROCAR 12M 150ML BLUNT (TROCAR) ×4 IMPLANT
TROCAR XCEL 12X100 BLDLESS (ENDOMECHANICALS) ×4 IMPLANT
TROCAR XCEL NON-BLD 5MMX100MML (ENDOMECHANICALS) ×4 IMPLANT
TUBING INSUFFLATION 10FT LAP (TUBING) ×5 IMPLANT
WATER STERILE IRR 1500ML POUR (IV SOLUTION) ×8 IMPLANT

## 2013-02-15 NOTE — Anesthesia Postprocedure Evaluation (Signed)
Anesthesia Post Note  Patient: David [name]  Procedure(s) Performed: Procedure(s) (LRB): ROBOTIC ASSITED PARTIAL NEPHRECTOMY (Right) ROBOTIC ASSISTED LAPAROSCOPIC RADICAL PROSTATECTOMY (N/A) LYMPHADENECTOMY WITH INDOCYANINE GREEN DYE INJECTION (Bilateral)  Anesthesia type: General  Patient location: PACU  Post pain: Pain level controlled  Post assessment: Post-op Vital signs reviewed  Last Vitals: BP 132/59  Pulse 83  Temp(Src) 36.5 C (Oral)  Resp 17  SpO2 95%  Post vital signs: Reviewed  Level of consciousness: sedated  Complications: No apparent anesthesia complications

## 2013-02-15 NOTE — Brief Op Note (Signed)
02/15/2013  2:53 PM  PATIENT:  David [name]  72 y.o. male  PRE-OPERATIVE DIAGNOSIS:  RIGHT KIDNEY MASS, PROSTATE CANCER  POST-OPERATIVE DIAGNOSIS:  RIGHT KIDNEY MASS, PROSTATE CANCER CANCER  PROCEDURE:  Procedure(s): ROBOTIC ASSITED PARTIAL NEPHRECTOMY (Right) ROBOTIC ASSISTED LAPAROSCOPIC RADICAL PROSTATECTOMY (N/A) LYMPHADENECTOMY WITH INDOCYANINE GREEN DYE INJECTION (Bilateral)  SURGEON:  Surgeon(s) and Role:    * Sebastian Ache, MD - Primary  PHYSICIAN ASSISTANT:   ASSISTANTS: Lujean Rave, PA   ANESTHESIA:   general  EBL:  Total I/O In: 1400 [I.V.:1400] Out: 275 [Urine:75; Blood:200]  BLOOD ADMINISTERED:none  DRAINS: JP to bulb suction, Foley to straight drain   LOCAL MEDICATIONS USED:  MARCAINE     SPECIMEN:  Source of Specimen:  1 - Rt Renal Mass, 2 - Base of Rt Renal Mass, 3 - Radical Prostatectomy, 4 - Bilatearl Pelvic Lymph Nods  DISPOSITION OF SPECIMEN:  PATHOLOGY  COUNTS:  YES  TOURNIQUET:  * No tourniquets in log *  DICTATION: .Other Dictation: Dictation Number [id number]  PLAN OF CARE: Admit to inpatient   PATIENT DISPOSITION:  PACU - hemodynamically stable.   Delay start of Pharmacological VTE agent (>24hrs) due to surgical blood loss or risk of bleeding: yes

## 2013-02-15 NOTE — Transfer of Care (Signed)
Immediate Anesthesia Transfer of Care Note  Patient: Brien Few  Procedure(s) Performed: Procedure(s): ROBOTIC ASSITED PARTIAL NEPHRECTOMY (Right) ROBOTIC ASSISTED LAPAROSCOPIC RADICAL PROSTATECTOMY (N/A) LYMPHADENECTOMY WITH INDOCYANINE GREEN DYE INJECTION (Bilateral)  Patient Location: PACU  Anesthesia Type:General  Level of Consciousness: awake, sedated and patient cooperative  Airway & Oxygen Therapy: Patient Spontanous Breathing and Patient connected to face mask oxygen  Post-op Assessment: Report given to PACU RN and Post -op Vital signs reviewed and stable  Post vital signs: Reviewed and stable  Complications: No apparent anesthesia complications

## 2013-02-15 NOTE — Anesthesia Preprocedure Evaluation (Addendum)
Anesthesia Evaluation  Patient identified by MRN, date of birth, ID band Patient awake    Reviewed: Allergy & Precautions, H&P , NPO status , Patient's Chart, lab work & pertinent test results  Airway Mallampati: III TM Distance: >3 FB Neck ROM: Full    Dental no notable dental hx. (+) Dental Advisory Given   Pulmonary former smoker,  breath sounds clear to auscultation  Pulmonary exam normal       Cardiovascular hypertension, Pt. on medications Rhythm:Regular Rate:Normal     Neuro/Psych negative neurological ROS  negative psych ROS   GI/Hepatic Neg liver ROS, GERD-  Medicated,  Endo/Other  negative endocrine ROS  Renal/GU Renal diseasenegative Renal ROS     Musculoskeletal negative musculoskeletal ROS (+)   Abdominal   Peds  Hematology negative hematology ROS (+)   Anesthesia Other Findings   Reproductive/Obstetrics                         Anesthesia Physical Anesthesia Plan  ASA: II  Anesthesia Plan: General   Post-op Pain Management:    Induction: Intravenous  Airway Management Planned: Oral ETT  Additional Equipment:   Intra-op Plan:   Post-operative Plan:   Informed Consent:   Plan Discussed with:   Anesthesia Plan Comments:        Anesthesia Quick Evaluation

## 2013-02-15 NOTE — H&P (Signed)
David [name] is an 72 y.o. male.    Chief Complaint: Pre-op Robotic Rt Partial Nephrectomy + Prostatectomy  HPI:   1- Moderate Risk Prostate Cancer:  -Oct 2013 : T1c, 45 g prostate, PSA 6.76, Gleason 3+3 = 6, three cores, < 5 - 40% HGPIN and atypia in three cores  -Apr 2014 MRI prostate normal  -Oct 2014 PSA 7.78, Intermediate risk PCa, T2 right apex, Gleason 3+4=7 RAL, RAM, G 3+3=6 RML, LAL  2 - Right Renal Neoplasm - New incidental 3.5cm lower pole enhancing mass by CT 2014. 2 artery, 2 vein renovascular anatomy. Clinically localized.   PMH sig for large ventral hernia repair with mesh, ortho surgery, TURP. No CV disease. No strong blood thinners.  Today David [name] is seen ito proceed with surgery for above. No interval fevers.   Past Medical History  Diagnosis Date  . Hyperlipidemia   . Hypertension   . LBP (low back pain) 06/2005    L5 radicular symptoms; MRI of LS spine severe spondylosis at L4-L5 with central cancal stenosis   . History of kidney stones     2007  . Spermatocele   . BPH (benign prostatic hypertrophy)   . Organic impotence   . Nocturia   . Arthritis     gen.  and left hip  . Wears glasses   . Prostate cancer 10/2011    (Low grade) [facility] - Dr. Mena Goes  . GERD (gastroesophageal reflux disease)     occasional  . Kidney tumor     Past Surgical History  Procedure Laterality Date  . Colonoscopy  02/15/2006, 2014    Internal hemorrhoids (Dr Jarold Motto), last in 2014  . Repair umbilical and ventral hernia's w/ mesh  08-21-2009  . Transurethral resection of prostate  AGE 42  . Tonsillectomy and adenoidectomy  as child  . Inguinal hernia repair Right 1980  . Spermatocelectomy Left 12/18/2012    Procedure: LEFT SPERMATOCELECTOMY;  Surgeon: Antony Haste, MD;  Location: [facility];  Service: Urology;  Laterality: Left;  . Prostate biopsy N/A 12/18/2012    Procedure: BIOPSY TRANSRECTAL ULTRASONIC PROSTATE (TUBP);   Surgeon: Antony Haste, MD;  Location: [facility];  Service: Urology;  Laterality: N/A;  . Prostate biopsy  12/05/11    gleason 6, 3/12 cores    Family History  Problem Relation Age of Onset  . Cancer Mother     colon  . Colon cancer Mother 52  . Pulmonary fibrosis Father     father owned a [employer] operational officer (father blamed inhalation of chemicals and fumes)  . Coronary artery disease Other   . Hyperlipidemia Other    Social History:  reports that he quit smoking about 31 years ago. His smoking use included Cigarettes. He has a 2.5 pack-year smoking history. He has never used smokeless tobacco. He reports that he drinks alcohol. He reports that he does not use illicit drugs.  Allergies:  Allergies  Allergen Reactions  . Ace Inhibitors Other (See Comments)    REACTION: Cough  . Codeine     Severe hiccups  . Percocet [Oxycodone-Acetaminophen]     Severe hiccups    Medications Prior to Admission  Medication Sig Dispense Refill  . amLODipine-olmesartan (AZOR) 5-20 MG per tablet Take 1 tablet by mouth every evening.      [name] aspirin 81 MG tablet Take 1 tablet (81 mg total) by mouth daily.  30 tablet  0  . ibuprofen (ADVIL,MOTRIN) 200 MG tablet  Take 400 mg by mouth every 6 (six) hours as needed for mild pain or moderate pain.      . mirabegron ER (MYRBETRIQ) 25 MG TB24 tablet Take 1 tablet (25 mg total) by mouth daily.  30 tablet  0  . Multiple Vitamin (MULTIVITAMIN) tablet Take 1 tablet by mouth daily.        [name] omeprazole (PRILOSEC) 20 MG capsule Take 20 mg by mouth every evening.      . simvastatin (ZOCOR) 40 MG tablet Take 40 mg by mouth every evening.      . sodium chloride (OCEAN) 0.65 % nasal spray Place 1 spray into the nose daily as needed for congestion.        No results found for this or any previous visit (from the past 48 hour(s)). No results found.  Review of Systems  Constitutional: Negative.  Negative for fever and chills.  HENT: Negative.    Eyes: Negative.   Respiratory: Negative.   Cardiovascular: Negative.   Gastrointestinal: Negative.  Negative for nausea and vomiting.  Genitourinary: Negative.   Musculoskeletal: Negative.   Skin: Negative.   Neurological: Negative.   Endo/Heme/Allergies: Negative.   Psychiatric/Behavioral: Negative.     Blood pressure 126/74, pulse 64, temperature 97.7 F (36.5 C), temperature source Oral, resp. rate 18, SpO2 97.00%. Physical Exam  Constitutional: He is oriented to person, place, and time. He appears well-developed and well-nourished.  HENT:  Head: Normocephalic and atraumatic.  Eyes: EOM are normal. Pupils are equal, round, and reactive to light.  Neck: Normal range of motion. Neck supple.  Cardiovascular: Normal rate.   GI: Soft. He exhibits no distension and no mass. There is no tenderness. There is no rebound and no guarding.  Prior periumbilical incisions c/w piror hernia repair  Genitourinary: Penis normal.  Musculoskeletal: Normal range of motion.  Neurological: He is alert and oriented to person, place, and time.  Skin: Skin is warm and dry.  Psychiatric: He has a normal mood and affect. His behavior is normal. Judgment and thought content normal.     Assessment/Plan  1- Moderate Risk Prostate Cancer - Pt with clear progression on surveillance and leading towards active treatment, which I support.  Ventral hernia repair would make surgery a bit more challenging. Originally considered pre-peritoneal, but in setting of new and worrisome rt renal mass, I feel that combined single-setting surgery would be best, beginning with rt robotic partial nephrectomy and proceeding with robotic prostatectomy as long as going well. Discussed that he is ceratinly at increased risk from bowel complications and need for possible bowel resection as well as open conversion. We also discussed option of purely non-surgical approach utilizing perc ablation of kidney mass and radiation for  prostate. PT wants to proceed with surgery for both.  We rediscussed prostatectomy and specifically robotic prostatectomy with bilateral pelvic lymphadenectomy being the technique that I most commonly perform. I showed the patient on their abdomen the approximately 6 small incision (trocar) sites as well as presumed extraction sites with robotic approach as well as possible open incision sites should open conversion be necessary. We rediscussed peri-operative risks including bleeding, infection, deep vein thrombosis, pulmonary embolism, compartment syndrome, nuropathy / neuropraxia, heart attack, stroke, death, as well as long-term risks such as non-cure / need for additional therapy. We specifically addressed that the procedure would compromise urinary control leading to stress incontinence which typically resolves with time and pelvic rehabilitation (Kegel's, etc..), but can sometimes be permanent and require additional therapy including surgery.  We also specifically readdressed sexual sequellae including significant erectile dysfunction which typically partially resolves with time but can also be permanent and require additional therapy including surgery.  2 - Right Renal Neoplasm - New worrisome mass, new since prior CT several years ago. Likely more malignant potential than his prostate cancer. WIll proceed with robotic partial nephrectomy and prostatecotmy at same setting as long as proceeding safely. Otherwise will perform kidney alone and prostate 6-12 weeks later.  We rediscussed the role of partial nephrectomy with the overall goals being a balance of trying to achieve complete surgical excision (negative margins) while minimizing loss of normally functioning kidney. We then rediscussed surgical approaches including robotic and open techniques with robotic associated with a shorter convalescence. I showed the patient on their abdomen the approximately 4-6 incision (trocar) sites as well as presumed  extraction sites with robotic approach as well as possible open incision sites. We specifically addressed that there may be need to alter operative plans according to intraopertive findings including conversion to open procedure or conversion to radical nephrectomy as well as need for adjunctive procedures such as ureteral stenting to promote correct renal healing. We rediscussed specific peri-operative risks including bleeding, infection, deep vein thrombosis, pulmonary embolism, compartment syndrome, neuropathy / neuropraxia, heart attack, stroke, death, as well as long-term risks such as non-cure / need for additional therapy and need for imaging and lab based post-op surveillance protocols. We rediscussed typical hospital course of approximately 2 day hospitalization, need for peri-operative drains / catheters, and typical post-hospital course with return to most non-strenuous activities by 2 weeks and ability to return to most jobs and more strenuous activity such as exercise by 6 weeks.   After this lengthy and detail discussion, including answering all of the patient's questions to their satisfaction, they have chosen to proceed with surgery as per above.   David [name] 02/15/2013, 6:00 AM

## 2013-02-16 ENCOUNTER — Other Ambulatory Visit: Payer: Self-pay | Admitting: Internal Medicine

## 2013-02-16 LAB — BASIC METABOLIC PANEL
BUN: 23 mg/dL (ref 6–23)
Calcium: 9.7 mg/dL (ref 8.4–10.5)
Chloride: 107 mEq/L (ref 96–112)
Creatinine, Ser: 1.19 mg/dL (ref 0.50–1.35)
GFR calc Af Amer: 69 mL/min — ABNORMAL LOW (ref >90)
GFR calc non Af Amer: 60 mL/min — ABNORMAL LOW (ref >90)
Glucose, Bld: 151 mg/dL — ABNORMAL HIGH (ref 70–99)
Sodium: 138 mEq/L (ref 135–145)

## 2013-02-16 LAB — HEMOGLOBIN AND HEMATOCRIT, BLOOD
HCT: 43.3 % (ref 39.0–52.0)
Hemoglobin: 14.5 g/dL (ref 13.0–17.0)

## 2013-02-16 MED ORDER — TRAMADOL HCL 50 MG PO TABS
50.0000 mg | ORAL_TABLET | Freq: Four times a day (QID) | ORAL | Status: DC | PRN
  Administered 2013-02-16 – 2013-02-17 (×4): 50 mg via ORAL
  Filled 2013-02-16 (×4): qty 1

## 2013-02-16 MED ORDER — IBUPROFEN 600 MG PO TABS
600.0000 mg | ORAL_TABLET | Freq: Four times a day (QID) | ORAL | Status: DC | PRN
  Filled 2013-02-16: qty 1

## 2013-02-16 MED ORDER — ZOLPIDEM TARTRATE 5 MG PO TABS
5.0000 mg | ORAL_TABLET | Freq: Every evening | ORAL | Status: DC | PRN
  Administered 2013-02-17: 20:00:00 5 mg via ORAL
  Filled 2013-02-16: qty 1

## 2013-02-16 MED ORDER — BACITRACIN-NEOMYCIN-POLYMYXIN 400-5-5000 EX OINT: 1.0000 "application " | TOPICAL_OINTMENT | Freq: Three times a day (TID) | CUTANEOUS | Status: DC | PRN

## 2013-02-16 MED ORDER — MENTHOL 3 MG MT LOZG
1.0000 | LOZENGE | OROMUCOSAL | Status: DC | PRN
  Filled 2013-02-16: qty 9

## 2013-02-16 MED ORDER — PHENOL 1.4 % MT LIQD
1.0000 | OROMUCOSAL | Status: DC | PRN
  Filled 2013-02-16: qty 177

## 2013-02-16 NOTE — Progress Notes (Signed)
1 Day Post-Op Subjective: The patient is doing well.  No nausea or vomiting. Pain is adequately controlled when not moving - allergy to codeine.  C/o sore throat.  Objective: Vital signs in last 24 hours: Temp:  [97.3 F (36.3 C)-98.6 F (37 C)] 98.6 F (37 C) (12/20 0548) Pulse Rate:  [80-93] 93 (12/20 0548) Resp:  [15-31] 31 (12/20 0548) BP: (117-132)/(50-71) 117/50 mmHg (12/20 0548) SpO2:  [93 %-98 %] 93 % (12/20 0548) Weight:  [101.969 kg (224 lb 12.8 oz)] 101.969 kg (224 lb 12.8 oz) (12/19 1700)  Intake/Output from previous day: 12/19 0701 - 12/20 0700 In: 4130 [P.O.:330; I.V.:3750; IV Piggyback:50] Out: 1337 [Urine:1050; Drains:87; Blood:200] Intake/Output this shift:    Physical Exam:  General: Alert and oriented. GI: Soft, tender to palpation Incisions: Clean and dry. Urine: Clear Extremities: Nontender, no erythema, no edema.  Lab Results:  Recent Labs  02/15/13 1521 02/16/13 0600  HGB 14.9 14.5  HCT 43.4 43.3          Recent Labs  02/12/13 1605 02/15/13 1521 02/16/13 0600  CREATININE 0.96 1.43* 1.19           Results for orders placed during the hospital encounter of 02/15/13 (from the past 24 hour(s))  HEMOGLOBIN AND HEMATOCRIT, BLOOD     Status: None   Collection Time    02/15/13  3:21 PM      Result Value Range   Hemoglobin 14.9  13.0 - 17.0 g/dL   HCT 16.1  09.6 - 04.5 %  BASIC METABOLIC PANEL     Status: Abnormal   Collection Time    02/15/13  3:21 PM      Result Value Range   Sodium 138  135 - 145 mEq/L   Potassium 4.7  3.5 - 5.1 mEq/L   Chloride 106  96 - 112 mEq/L   CO2 21  19 - 32 mEq/L   Glucose, Bld 181 (*) 70 - 99 mg/dL   BUN 21  6 - 23 mg/dL   Creatinine, Ser 4.09 (*) 0.50 - 1.35 mg/dL   Calcium 9.4  8.4 - 81.1 mg/dL   GFR calc non Af Amer 48 (*) >90 mL/min   GFR calc Af Amer 55 (*) >90 mL/min  HEMOGLOBIN AND HEMATOCRIT, BLOOD     Status: None   Collection Time    02/16/13  6:00 AM      Result Value Range   Hemoglobin  14.5  13.0 - 17.0 g/dL   HCT 91.4  78.2 - 95.6 %  BASIC METABOLIC PANEL     Status: Abnormal   Collection Time    02/16/13  6:00 AM      Result Value Range   Sodium 138  135 - 145 mEq/L   Potassium 4.3  3.5 - 5.1 mEq/L   Chloride 107  96 - 112 mEq/L   CO2 21  19 - 32 mEq/L   Glucose, Bld 151 (*) 70 - 99 mg/dL   BUN 23  6 - 23 mg/dL   Creatinine, Ser 2.13  0.50 - 1.35 mg/dL   Calcium 9.7  8.4 - 08.6 mg/dL   GFR calc non Af Amer 60 (*) >90 mL/min   GFR calc Af Amer 69 (*) >90 mL/min    Assessment/Plan: POD# 1 s/p robotic partial nephrectomy and radical prostatectomy.  1) Ambulate, Incentive spirometry 2) Advance diet as tolerated 3)  keep foley catheter 4) d/c drain 5) cepacol throat lozenges 6) tramadol, Ibuprofen, Tylenol for pain  PRN        LOS: 1 day   Crist Fat 02/16/2013, 9:53 AM

## 2013-02-16 NOTE — Op Note (Signed)
NAME:  David [name], David [name] NO.:  [id number]  MEDICAL RECORD NO.:  [id number]  LOCATION:  1408                         FACILITY:  [facility]  PHYSICIAN:  Sebastian Ache, MD     DATE OF BIRTH:  [birth year]  DATE OF PROCEDURE:  02/15/2013 DATE OF DISCHARGE:                              OPERATIVE REPORT   DIAGNOSES: 1. Right lower pole renal mass worrisome for renal cell carcinoma. 2. Moderate risk prostate cancer. 3. History of ventral hernia repair with mesh.  PROCEDURE: 1. Robotic-assisted laparoscopic right partial nephrectomy. 2. Robotic-assisted laparoscopic radical nephrectomy with bilateral     pelvic lymphadenectomy and ICG sentinel lymph node plus template     Dissection. 3. Extensive robotic-assisted laparosopic adhesiolysis.  ESTIMATED BLOOD LOSS:  200 mL.  COMPLICATIONS:  None.  SPECIMENS: 1. Lower pole right renal mass. 2. Base of right lower renal mass resection margin. 3. Periprostatic fat. 4. Radical prostatectomy. 5. Right external iliac lymph nodes. 6. Right obturator lymph nodes. 7. Left external iliac lymph nodes. 8. Left obturator lymph nodes. 9. Left internal iliac lymph node sentinel. 10.Left perivesical lymph node sentinel.  FINDINGS: 1. Renal mass hypofluorescence relative to kidney. 2. Two artery, two vein right renovascular anatomy. 3. Sentinel-appearing lymph nodes left perivesical and left     intrailiac, otherwise no pelvic sentinel lymph nodes with ICG     prostate injection. 4. Dense adhesions of small bowel and colon into the right lower     quadrant and right hemi abdomen. 5. Relative paucity adhesions in the area of prior ventral hernia     repair.  ASSISTANT:  Pecola Leisure, PA.  DRAINS: 1. Jackson-Pratt drain to bulb suction. 2. Foley catheter to straight drain.  INDICATION:  [name] is a very pleasant 72 year old gentleman with history of adenocarcinoma of the prostate on active surveillance, who on per  protocol rebiopsy was found to have higher grade tumor and moderate risk disease.  Options were discussed including continued surveillance versus ablative therapy versus surgical extirpation and he wished to proceed with the latter with minimally invasive assistance.  Given his history of hernia repair with mesh, axilla imaging was obtained to see if there appeared to be a safe plane in the anterior abdominal wall.  On his imaging, he was found to have a right lower pole renal mass, very worrisome for renal cell carcinoma that was clinically localized.  Given also the finding of his right renal mass, we entertained the option of management of both lesions at the same time with right robotic partial nephrectomy and robotic prostatectomy and he adamantly wished to proceed.  We felt this approach starting with the kidney would likely be safest as it would allow only one entrance to the abdomen and potential adhesiolysis with his history of mesh repair.  Informed consent was obtained and placed in medical record.  PROCEDURE IN DETAIL:  The patient being David [name] and procedure being robotic right partial nephrectomy and radical prostatectomy was confirmed.  Procedure was carried out.  Time-out was performed. Intravenous antibiotics administered.  General endotracheal anesthesia was introduced.  The patient placed into a right side up full flank position applying 15 degrees stable flexion, superior arm  elevator, sequential compression devices, and bean bag.  He has further fashioned on the operative table using 3-inch tape over foam.  Sterile field was created by prepping and draping the patient's infra-xiphoid abdomen using chlorhexidine gluconate.  Next with high-flow low pressure pneumoperitoneum was obtained using Veress technique in the right lower quadrant having passed the aspiration and drop test.  Next, a 12-mm robotic camera port was placed and positioned approximately  1 handbreadth superior and lateral to the umbilicus.  This was well away from area of same prior mesh.  Laparoscopic examination of the abdominal cavity  revealed actually relative paucity of adhesions in the area of prior mesh repair, however, there were significant adhesions in the right hemi-abdomen and right lower quadrant of both small bowel and large bowel.  There was no visceral injury.  Additional ports were then placed as follows; right subcostal 8-mm robotic port, right paramedian inferior 8-mm robotic port, right far lateral 8-mm robotic port, 1 handbreadth superior and medial to the anterior superior iliac spine.  A 12 mm assist port in the midline below the umbilicus, 12 mm assist port in the midline 3 fingerbreadths above the camera port, and a 5 mm subxiphoid liver retraction port.  The liver was placed on gentle lateral traction with self-locking grasper.  Robot was docked and passed the electronic checks.  Initial attention was directed to adhesiolysis. Very careful adhesiolysis was performed taking down simple adhesions of small bowel and large bowel in the right lower quadrant.  Attention was then directed at development of the right retroperitoneum.  An incision was made lateral to the ascending colon from the area of the cecum towards the area of the hepatic flexure.  The duodenum was encountered and Kocherized medially. The lower pole of the kidney was identified, placed on gentle lateral traction.  Dissection proceeded medial to this.  The ureter and gonadal vein were identified, also placed on gentle lateral traction. Dissection proceeded within this triangle towards the renal hilum and the area of renal hilum consisted of 2 artery, 2 vein renovascular anatomy as suspected with 2 small arteries seen was between a dominant upper vein and the smaller lower vein.  The right gonadal vein was intimately associated with these structures and to better dissect these out,  the right gonadal vein was taken using cold clipping, 2 up 2 down.  All these vessels were then marked with Vessel loops and set aside. Dissection then proceeded directly on to the anterior mid surface of the kidney and then inferiorly towards the area of the mass.  Mass in question was associated with very dense desmoplastic fat and a large bucket-handle fat was left with the mass in question and the normal kidney mass interface was carefully dissected circumferentially to allow for later renorrhaphy.  25 g of mannitol was given intravenously, 2 mL of ICG dye was given intravenously which corroborated the 2 artery, 2 vein renovascular anatomy.  Next, both arteries were clamped and partial nephrectomy was performed using cold scissors, keeping what appeared to be a normal rim of renal parenchyma with the partial nephrectomy specimen.  Renorrhaphy was then performed first using a running 3-0 V- Loc oversewing any injury to the collecting system or obvious pulsatile vessels.  A second compression layer of 2-0 V-Loc sandwiched between Hem- o-lok clips was then performed.  The bulldog clamps and arteries were removed for a total warm ischemia time 15 minutes.  The renorrhaphy appeared excellently hemostatic without obvious urine leak.  10 mL  of Floseal was applied on this.  Retroperitoneum was reapproximated using cold clips.  The specimen was placed into an EndoCatch bag for later retrieval.  At this point, the previous superior most assistant port site, liver retraction port site, and superior most robotic sites were then all closed.  All ports were removed.  Ioban sheet was placed across these, and the patient was placed into a new bed, repositioned into a low lithotomy position after tucking his arms and further fashioned table using 3-inch tape over foam across his chest placing into steep Trendelenburg position.  The Collier Flowers was removed and a new sterile field was created by prepping and  draping the patient's penis, perineum, proximal thighs using iodine x3 and then for xiphoid abdomen using chlorhexidine gluconate once again.  Pneumoperitoneum was reobtained by reinserting a 12 mm blunt trocar into the previous inferior-most kidney assistant port site, which resulted in excellent resumption of pneumoperitoneum.  This site was then set up with a camera port site. The previous right lateral most port site was converted to a 12 mm assitant port site.  The right paramedian port site was maintained as a robotic port and 2 additional robotic ports were placed, left paramedian and left far lateral under direct vision laparoscopically.  Robot was re- docked and passed through electronic checks again.  Antibiotics were re- dosed.  Attention was then directed to the development of the space of Retzius.  There were no adhesions in the pelvis.  Incision was then made lateral to the right medial umbilical ligament from midline towards the area of the internal ring and coursing along the external iliac vessels. The vas deferens was ligated during these maneuvers purposely, used a bucket-handle to draw medially.  The bladder was then swept away from the lateral side of the pelvic sidewall towards the area of the endopelvic fascia.  Mirror image dissection was performed on the left side.  The anterior attachments were then taken down, which exposed the anterior base of the prostate.  Next, 0.2 mL of indocyanine green was injected intraprostatically via percutaneous robotic-guided spinal needle taking great care to avoid spillage and this did not occur.  Next, the umbilical fascia was carefully incised bilaterally and the lateral aspect of the prostate were released from the pelvic sidewall. Dorsal venous complex was controlled using a vascular load stapler taking great care not to include the membranous urethra and this did not occur.  It had been approximately 15 minutes post ICG  injection into the prostate and attention was then directed to lymphadenectomy first on the right side.  Pelvis was interrogated with near infrared light and no sentinel lymph nodes were dissected.  As such, a template dissection was performed first on the right external iliac lymph nodes, the confines being right external iliac artery and vein, and pelvic side wall. Hemostasis was achieved with cold clips and set aside, labeled as right external iliac lymph nodes.  Right obturator pocket was then carefully dissected free with confines being external iliac vein, obturator nerve, and pelvic side wall.  Hemostasis again achieved with cold clips.  The obturator nerve inspected following these maneuvers and found to be uninjured.  Attention was then directed at the left side.  Under near infrared fluorescence, 2 areas of sentinel node were seen that were hyperfluorescent, one corresponding to an internal iliac node and the other corresponding to a perivesical node.  These were carefully dissected free and set aside prospectively labeled sentinel pelvic lymph nodes.  Next, template  dissection was performed of the left external iliac group and of the left obturator group as on the right side a similar template and again the left obturator nerve was inspected following these maneuvers and found to be uninjured.  Next, the bladder neck dissection was performed by moving the Foley catheter back and forth identifying the bladder neck.  This was dissected free from the base of the prostate in an anterior-posterior direction, taking great care to leave an adequate bladder neck.  Had excessive caliber.  Posterior dissection was performed by incising 5 mm inferior to the inferior lip of the bladder neck and dissecting directly posteriorly.  The plane of Dennonviller was entered and the bilateral vas deferens were encountered disected for distance of 4 cm ligated, placed in gentle superior traction.   Bilateral seminal vesicles were dissected to the tips and placed on gentle superior traction. Dissection then proceeded in a base to apex orientation sweeping the rectal tissue posteriorly thus exposing the prostatic pedicles on both sides.  First in the left side, the pedicles were controlled using sequential clipping technique, not performing nerve sparing.  Similarly right pedicle dissection was performed.  Next, anterior dissection was performed identifying the apex of the prostate, placed the prostate in gentle superior traction and cutting the membranous urethra coldly.  Ligament appeared to be an adequate membranous urethral stump with some additional posterior urethral plate attachments that were taken down with coldly.  Digital rectal exam was then performed with indicator glove and no rectal dilation was encountered.  Next, posterior reconstruction was performed using a figure-of-eight V- Loc reapproximating the posterior urethral plate to the posterior bladder neck bringing the structures in tension-free apposition.  Next, mucosa-to-mucosa anastomosis was performed from the 12 o'clock to the 6 o'clock position using double-armed V-Loc suture on the membranous urethra and the bladder neck.  This was irrigated quantitatively and there was no leak identified.  Anterior reconstruction was performed by anchoring the same suture to the puboprostatic ligaments.  At this point, all sponge and needle counts were correct.  Closed suction drain was brought through the previous left lateral most robotic port site. Previous right lateral most assistant port site was closed with fascia using suture passer and 0-Vicryl.  Specimen was retrieved by extending the previous camera port site superiorly for a total distance of approximately 3 fingerbreadths and removing the right lower pole renal mass, and setting aside for permanent pathology as well as radical prostatectomy setting aside for  permanent pathology separately.  The extraction site was closed with fascia using figure-of-eight Prolene x3. A new approximating Scarpa's using running 0-Vicryl.  All skin incisions were reapproximated using subcuticular Monocryl followed by Dermabond. Procedure was then terminated.  The patient tolerated the procedure well.  There were no immediate periprocedural complications.  The patient was taken to the postanesthesia care unit in a stable condition.          ______________________________ Sebastian Ache, MD     TM/MEDQ  D:  02/15/2013  T:  02/16/2013  Job:  [id number]

## 2013-02-17 LAB — CBC
HCT: 41.2 % (ref 39.0–52.0)
Hemoglobin: 14.1 g/dL (ref 13.0–17.0)
MCH: 31.7 pg (ref 26.0–34.0)
MCHC: 34.2 g/dL (ref 30.0–36.0)
Platelets: 184 10*3/uL (ref 150–400)
RDW: 13.8 % (ref 11.5–15.5)
WBC: 20 10*3/uL — ABNORMAL HIGH (ref 4.0–10.5)

## 2013-02-17 LAB — BASIC METABOLIC PANEL
BUN: 24 mg/dL — ABNORMAL HIGH (ref 6–23)
Calcium: 10.6 mg/dL — ABNORMAL HIGH (ref 8.4–10.5)
GFR calc Af Amer: 76 mL/min — ABNORMAL LOW (ref >90)
GFR calc non Af Amer: 66 mL/min — ABNORMAL LOW (ref >90)
Glucose, Bld: 144 mg/dL — ABNORMAL HIGH (ref 70–99)
Potassium: 4.4 mEq/L (ref 3.5–5.1)
Sodium: 136 mEq/L (ref 135–145)

## 2013-02-17 NOTE — Progress Notes (Signed)
2 Days Post-Op Subjective: Walked 3 x yesterday, JP removed       The patient is doing well. Tired this AM, not sleeping well Sore throat improved  No appetite  Objective: Vital signs in last 24 hours: Temp:  [98.7 F (37.1 C)-99.2 F (37.3 C)] 98.8 F (37.1 C) (12/21 0453) Pulse Rate:  [86-89] 89 (12/21 0453) Resp:  [20-24] 20 (12/21 0453) BP: (119-139)/(58-70) 131/69 mmHg (12/21 0453) SpO2:  [92 %-95 %] 92 % (12/21 0453)  Intake/Output from previous day: 12/20 0701 - 12/21 0700 In: 3100 [I.V.:3100] Out: 930 [Urine:900; Drains:30] Intake/Output this shift:    Physical Exam:  General: Alert and oriented. GI: Soft, tender to palpation Incisions: Clean and dry. Urine: Clear Extremities: Nontender, no erythema, no edema.  Lab Results:  Recent Labs  02/15/13 1521 02/16/13 0600 02/17/13 0640  HGB 14.9 14.5 14.1  HCT 43.4 43.3 41.2           Recent Labs  02/15/13 1521 02/16/13 0600 02/17/13 0640  CREATININE 1.43* 1.19 1.10           Results for orders placed during the hospital encounter of 02/15/13 (from the past 24 hour(s))  CBC     Status: Abnormal   Collection Time    02/17/13  6:40 AM      Result Value Range   WBC 20.0 (*) 4.0 - 10.5 K/uL   RBC 4.45  4.22 - 5.81 MIL/uL   Hemoglobin 14.1  13.0 - 17.0 g/dL   HCT 45.4  09.8 - 11.9 %   MCV 92.6  78.0 - 100.0 fL   MCH 31.7  26.0 - 34.0 pg   MCHC 34.2  30.0 - 36.0 g/dL   RDW 14.7  82.9 - 56.2 %   Platelets 184  150 - 400 K/uL  BASIC METABOLIC PANEL     Status: Abnormal   Collection Time    02/17/13  6:40 AM      Result Value Range   Sodium 136  135 - 145 mEq/L   Potassium 4.4  3.5 - 5.1 mEq/L   Chloride 105  96 - 112 mEq/L   CO2 24  19 - 32 mEq/L   Glucose, Bld 144 (*) 70 - 99 mg/dL   BUN 24 (*) 6 - 23 mg/dL   Creatinine, Ser 1.30  0.50 - 1.35 mg/dL   Calcium 86.5 (*) 8.4 - 10.5 mg/dL   GFR calc non Af Amer 66 (*) >90 mL/min   GFR calc Af Amer 76 (*) >90 mL/min    Assessment/Plan: POD#2  s/p robotic partial nephrectomy and radical prostatectomy, doing well.  1) Ambulate, Incentive spirometry 2) encourage PO intake 3) keep foley catheter 4) decrease IVF to 50cc/hr 5) cepacol throat lozenges 6) tramadol, Ibuprofen, Tylenol for pain PRN - adding Ambien QHS PRN 7) anticipate D/C tomorrow or Tuesday.        LOS: 2 days   Crist Fat 02/17/2013, 9:51 AM

## 2013-02-18 ENCOUNTER — Inpatient Hospital Stay (HOSPITAL_COMMUNITY): Payer: Medicare Other

## 2013-02-18 ENCOUNTER — Encounter (HOSPITAL_COMMUNITY): Payer: Self-pay | Admitting: Urology

## 2013-02-18 LAB — CBC
Hemoglobin: 13.9 g/dL (ref 13.0–17.0)
MCH: 31.6 pg (ref 26.0–34.0)
MCHC: 34.3 g/dL (ref 30.0–36.0)
Platelets: 203 10*3/uL (ref 150–400)
RBC: 4.4 MIL/uL (ref 4.22–5.81)
RDW: 13.5 % (ref 11.5–15.5)

## 2013-02-18 LAB — BASIC METABOLIC PANEL
Calcium: 10.7 mg/dL — ABNORMAL HIGH (ref 8.4–10.5)
GFR calc Af Amer: >90 mL/min (ref >90)
GFR calc non Af Amer: 81 mL/min — ABNORMAL LOW (ref >90)
Potassium: 4.2 mEq/L (ref 3.5–5.1)
Sodium: 132 mEq/L — ABNORMAL LOW (ref 135–145)

## 2013-02-18 MED ORDER — BISACODYL 10 MG RE SUPP
10.0000 mg | Freq: Once | RECTAL | Status: AC
  Administered 2013-02-18: 11:00:00 10 mg via RECTAL
  Filled 2013-02-18: qty 1

## 2013-02-18 NOTE — Progress Notes (Signed)
3 Days Post-Op  Subjective:   1- Moderate Risk Prostate Cancer + Rt Renal Neoplasm - s/p robotic rt partial nephrectomy + prostatectomy + adhesiolysis 12/19/201, path pending. JP removed as output minimal.   2 - Ileus - PT with worsening abd distension post-op and no flatus / BM as of yet. Ambulatory, tollerating diet, but with some mild nausea. He did have adhesiolysis as part of his procedure and has h/o abdominal hernia repairs with mesh.  Today David [name] is most bothered by ileus symptoms. He walked 5 times yesterday. Pain controlled, afebrile.   Objective: Vital signs in last 24 hours: Temp:  [97.7 F (36.5 C)-98.8 F (37.1 C)] 97.7 F (36.5 C) (12/22 0515) Pulse Rate:  [88-95] 95 (12/22 0515) Resp:  [18-20] 18 (12/22 0515) BP: (158-170)/(76-83) 158/77 mmHg (12/22 0515) SpO2:  [93 %] 93 % (12/22 0515) Last BM Date: 02/15/13  Intake/Output from previous day: 12/21 0701 - 12/22 0700 In: 1010.8 [P.O.:120; I.V.:890.8] Out: 1400 [Urine:1400] Intake/Output this shift:    General appearance: alert, cooperative and appears stated age Head: Normocephalic, without obvious abnormality, atraumatic Eyes: conjunctivae/corneas clear. PERRL, EOM's intact. Fundi benign. Ears: normal TM's and external ear canals both ears Nose: Nares normal. Septum midline. Mucosa normal. No drainage or sinus tenderness. Throat: lips, mucosa, and tongue normal; teeth and gums normal Neck: no adenopathy, no carotid bruit, no JVD, supple, symmetrical, trachea midline and thyroid not enlarged, symmetric, no tenderness/mass/nodules Back: symmetric, no curvature. ROM normal. No CVA tenderness. Resp: clear to auscultation bilaterally Chest wall: no tenderness Cardio: regular rate and rhythm, S1, S2 normal, no murmur, click, rub or gallop GI: Distended, tympanic abdomen. No reboung / guarding. + BS. Male genitalia: normal, foley c/d/i with yellow urine. Extremities: extremities normal, atraumatic, no cyanosis or  edema Pulses: 2+ and symmetric Skin: Skin color, texture, turgor normal. No rashes or lesions Lymph nodes: Cervical, supraclavicular, and axillary nodes normal. Neurologic: Grossly normal Incision/Wound: Recent port sites / extraction sites c/d/i. NO hernias or drainage. Prior JP site c/d/i.   Lab Results:   Recent Labs  02/16/13 0600 02/17/13 0640  WBC  --  20.0*  HGB 14.5 14.1  HCT 43.3 41.2  PLT  --  184   BMET  Recent Labs  02/16/13 0600 02/17/13 0640  NA 138 136  K 4.3 4.4  CL 107 105  CO2 21 24  GLUCOSE 151* 144*  BUN 23 24*  CREATININE 1.19 1.10  CALCIUM 9.7 10.6*   PT/INR No results found for this basename: LABPROT, INR,  in the last 72 hours ABG No results found for this basename: PHART, PCO2, PO2, HCO3,  in the last 72 hours  Studies/Results: No results found.  Anti-infectives: Anti-infectives   Start     Dose/Rate Route Frequency Ordered Stop   02/15/13 1200  piperacillin-tazobactam (ZOSYN) IVPB 3.375 g     3.375 g 100 mL/hr over 30 Minutes Intravenous  Once 02/15/13 1145 02/15/13 1153   02/15/13 0600  piperacillin-tazobactam (ZOSYN) IVPB 3.375 g     3.375 g 100 mL/hr over 30 Minutes Intravenous  Once 02/15/13 0551 02/15/13 0730      Assessment/Plan:  1- Moderate Risk Prostate Cancer + Rt Renal Neoplasm - Path pending. Encouraged ambulation.   2 - Ileus - Discussed usual time-course, and fact that pt did have adhesioloysis at time of surgery which may also aggravate. As no n/v favor conservative measures at this point. Ducolax spp today and ambulation. If develops n/v will consider temporary NGT.  3 -  CBC, BMP today. KUB for baseline. Remain in house for now.     David [name], David [name] 02/18/2013

## 2013-02-18 NOTE — Care Management Note (Addendum)
    Page 1 of 1   02/19/2013     1:36:03 PM   CARE MANAGEMENT NOTE 02/19/2013  Patient:  David [name], David [name]   Account Number:  [id number]  Date Initiated:  02/18/2013  Documentation initiated by:  Lanier Clam  Subjective/Objective Assessment:   72 Y/O M ADMITTED W/PROSTATE CA.     Action/Plan:   FROM HOME.   Anticipated DC Date:  02/19/2013   Anticipated DC Plan:  HOME/SELF CARE      DC Planning Services  CM consult      Choice offered to / List presented to:             Status of service:  Completed, signed off Medicare Important Message given?   (If response is "NO", the following Medicare IM given date fields will be blank) Date Medicare IM given:   Date Additional Medicare IM given:    Discharge Disposition:  HOME/SELF CARE  Per UR Regulation:  Reviewed for med. necessity/level of care/duration of stay  If discussed at Long Length of Stay Meetings, dates discussed:    Comments:  02/19/13 Marcial Pless RN,BSN NCM [phone] D/C HOME.NO NEEDS OR ORDERS.  02/18/13 Angelino Rumery RN,BSN NCM [phone] S/P PARTIAL NEPHRECTOMY,ILEUS.

## 2013-02-19 MED ORDER — TRAMADOL HCL 50 MG PO TABS: 50.0000 mg | ORAL_TABLET | Freq: Four times a day (QID) | ORAL | Status: DC | PRN

## 2013-02-19 MED ORDER — SULFAMETHOXAZOLE-TRIMETHOPRIM 400-80 MG PO TABS: 1.0000 | ORAL_TABLET | Freq: Two times a day (BID) | ORAL | Status: DC

## 2013-02-19 MED ORDER — SENNOSIDES-DOCUSATE SODIUM 8.6-50 MG PO TABS: 1.0000 | ORAL_TABLET | Freq: Two times a day (BID) | ORAL | Status: DC

## 2013-02-19 NOTE — Discharge Summary (Signed)
Physician Discharge Summary  Patient ID: David [name] MRN: [id number] DOB/AGE: Jan 19, 1941 72 y.o.  Admit date: 02/15/2013 Discharge date: 02/19/2013  Admission Diagnoses: Prostate Cancer, Rt Renal Mass  Discharge Diagnoses:  Active Problems:   Prostate cancer   Discharged Condition: good  Hospital Course:   1- Moderate Risk Prostate Cancer + Rt Renal Neoplasm - s/p robotic rt partial nephrectomy + prostatectomy + adhesiolysis 02/15/2013, path pending. JP removed as output minimal. Path pending at discharge.  2 - Ileus - PT with worsening abd distension post-op and KUB c/w ileus POD3. With more aggressive bowel regimen (PO + PR) and ambulation this resolved such that by time of discharge he was tollerating regular diet with resumed bowel function and resolved abd distention. He did have adhesiolysis as part of his procedure and has h/o abdominal hernia repairs with mesh.   Consults: None  Significant Diagnostic Studies: labs: Hgb >10, Path pending, Cr <1.5.  Treatments: surgery: robotic rt partial nephrectomy + prostatectomy + adhesiolysis 02/15/2013  Discharge Exam: Blood pressure 126/66, pulse 87, temperature 98.1 F (36.7 C), temperature source Oral, resp. rate 18, height 6' (1.829 m), weight 101.969 kg (224 lb 12.8 oz), SpO2 93.00%. General appearance: alert, cooperative and appears stated age Head: Normocephalic, without obvious abnormality, atraumatic Eyes: conjunctivae/corneas clear. PERRL, EOM's intact. Fundi benign. Ears: normal TM's and external ear canals both ears Nose: Nares normal. Septum midline. Mucosa normal. No drainage or sinus tenderness. Throat: lips, mucosa, and tongue normal; teeth and gums normal Neck: no adenopathy, no carotid bruit, no JVD, supple, symmetrical, trachea midline and thyroid not enlarged, symmetric, no tenderness/mass/nodules Back: symmetric, no curvature. ROM normal. No CVA tenderness. Resp: clear to auscultation bilaterally Chest  wall: no tenderness Cardio: regular rate and rhythm, S1, S2 normal, no murmur, click, rub or gallop GI: soft, non-tender; bowel sounds normal; no masses,  no organomegaly Male genitalia: normal, foley c/d/i wtih clear urine. No phimosis / paraphimosis Extremities: extremities normal, atraumatic, no cyanosis or edema Pulses: 2+ and symmetric Skin: Skin color, texture, turgor normal. No rashes or lesions Lymph nodes: Cervical, supraclavicular, and axillary nodes normal. Neurologic: Grossly normal Incision/Wound: All port sites / extraction / drain sites c/d/i. No hernias.  Disposition: 01-Home or Self Care   Future Appointments Provider Department Dept Phone   03/28/2013 1:15 PM [facility] Counsel[employer] [facility] [phone]   03/28/2013 1:30 PM Rachael Fee [facility] [phone]   03/28/2013 2:00 PM Josph Macho, MD [facility] [phone]       Medication List    STOP taking these medications       aspirin 81 MG tablet     ibuprofen 200 MG tablet  Commonly known as:  ADVIL,MOTRIN     multivitamin tablet      TAKE these medications       AZOR 5-20 MG per tablet  Generic drug:  amLODipine-olmesartan  Take 1 tablet by mouth every evening.     mirabegron ER 25 MG Tb24 tablet  Commonly known as:  MYRBETRIQ  Take 1 tablet (25 mg total) by mouth daily.     omeprazole 20 MG capsule  Commonly known as:  PRILOSEC  Take 20 mg by mouth every evening.     senna-docusate 8.6-50 MG per tablet  Commonly known as:  Senokot-S  Take 1 tablet by mouth 2 (two) times daily. While taking pain meds to prevent constipation  simvastatin 40 MG tablet  Commonly known as:  ZOCOR  take 1 tablet by mouth at bedtime     sodium chloride 0.65 % nasal spray  Commonly known as:  OCEAN  Place 1 spray into the nose daily as needed for congestion.     sulfamethoxazole-trimethoprim 400-80 MG per  tablet  Commonly known as:  BACTRIM  Take 1 tablet by mouth 2 (two) times daily. X 3 days. Start day prior to next urology appointment     traMADol 50 MG tablet  Commonly known as:  ULTRAM  Take 1-2 tablets (50-100 mg total) by mouth every 6 (six) hours as needed for moderate pain. Post-operatively           Follow-up Information   Follow up with Sebastian Ache, MD On 03/01/2013. (at 1:15)    Specialty:  Urology   Contact information:   [address] [place] [address] [phone]       Signed: Sebastian Ache 02/19/2013, 6:20 AM

## 2013-03-03 ENCOUNTER — Encounter (HOSPITAL_COMMUNITY): Payer: Self-pay | Admitting: Emergency Medicine

## 2013-03-03 ENCOUNTER — Emergency Department (HOSPITAL_COMMUNITY): Payer: Medicare Other

## 2013-03-03 ENCOUNTER — Inpatient Hospital Stay (HOSPITAL_COMMUNITY)
Admission: EM | Admit: 2013-03-03 | Discharge: 2013-03-06 | DRG: 659 | Disposition: A | Discharge status: Home / Self Care | Payer: Medicare Other | Attending: Internal Medicine | Admitting: Internal Medicine

## 2013-03-03 ENCOUNTER — Inpatient Hospital Stay (HOSPITAL_COMMUNITY): Payer: Medicare Other

## 2013-03-03 DIAGNOSIS — K429 Umbilical hernia without obstruction or gangrene: Secondary | ICD-10-CM

## 2013-03-03 DIAGNOSIS — N179 Acute kidney failure, unspecified: Secondary | ICD-10-CM | POA: Diagnosis present

## 2013-03-03 DIAGNOSIS — K560 Paralytic ileus: Secondary | ICD-10-CM | POA: Diagnosis not present

## 2013-03-03 DIAGNOSIS — IMO0002 Reserved for concepts with insufficient information to code with codable children: Secondary | ICD-10-CM | POA: Diagnosis present

## 2013-03-03 DIAGNOSIS — N151 Renal and perinephric abscess: Principal | ICD-10-CM | POA: Diagnosis present

## 2013-03-03 DIAGNOSIS — N135 Crossing vessel and stricture of ureter without hydronephrosis: Secondary | ICD-10-CM | POA: Diagnosis present

## 2013-03-03 DIAGNOSIS — N529 Male erectile dysfunction, unspecified: Secondary | ICD-10-CM

## 2013-03-03 DIAGNOSIS — C61 Malignant neoplasm of prostate: Secondary | ICD-10-CM | POA: Diagnosis present

## 2013-03-03 DIAGNOSIS — K929 Disease of digestive system, unspecified: Secondary | ICD-10-CM | POA: Diagnosis not present

## 2013-03-03 DIAGNOSIS — Z9079 Acquired absence of other genital organ(s): Secondary | ICD-10-CM

## 2013-03-03 DIAGNOSIS — G8918 Other acute postprocedural pain: Secondary | ICD-10-CM | POA: Diagnosis not present

## 2013-03-03 DIAGNOSIS — Z87442 Personal history of urinary calculi: Secondary | ICD-10-CM

## 2013-03-03 DIAGNOSIS — E860 Dehydration: Secondary | ICD-10-CM | POA: Diagnosis present

## 2013-03-03 DIAGNOSIS — Z87891 Personal history of nicotine dependence: Secondary | ICD-10-CM

## 2013-03-03 DIAGNOSIS — A419 Sepsis, unspecified organism: Secondary | ICD-10-CM

## 2013-03-03 DIAGNOSIS — Z905 Acquired absence of kidney: Secondary | ICD-10-CM

## 2013-03-03 DIAGNOSIS — D3000 Benign neoplasm of unspecified kidney: Secondary | ICD-10-CM | POA: Diagnosis present

## 2013-03-03 DIAGNOSIS — R351 Nocturia: Secondary | ICD-10-CM

## 2013-03-03 DIAGNOSIS — M129 Arthropathy, unspecified: Secondary | ICD-10-CM | POA: Diagnosis present

## 2013-03-03 DIAGNOSIS — D3001 Benign neoplasm of right kidney: Secondary | ICD-10-CM | POA: Diagnosis present

## 2013-03-03 DIAGNOSIS — R109 Unspecified abdominal pain: Secondary | ICD-10-CM | POA: Diagnosis not present

## 2013-03-03 DIAGNOSIS — Z7982 Long term (current) use of aspirin: Secondary | ICD-10-CM

## 2013-03-03 DIAGNOSIS — N39498 Other specified urinary incontinence: Secondary | ICD-10-CM | POA: Diagnosis present

## 2013-03-03 DIAGNOSIS — Z885 Allergy status to narcotic agent status: Secondary | ICD-10-CM

## 2013-03-03 DIAGNOSIS — N390 Urinary tract infection, site not specified: Secondary | ICD-10-CM | POA: Diagnosis present

## 2013-03-03 DIAGNOSIS — Z79899 Other long term (current) drug therapy: Secondary | ICD-10-CM

## 2013-03-03 DIAGNOSIS — K219 Gastro-esophageal reflux disease without esophagitis: Secondary | ICD-10-CM | POA: Diagnosis present

## 2013-03-03 DIAGNOSIS — N12 Tubulo-interstitial nephritis, not specified as acute or chronic: Secondary | ICD-10-CM | POA: Diagnosis present

## 2013-03-03 DIAGNOSIS — Z888 Allergy status to other drugs, medicaments and biological substances status: Secondary | ICD-10-CM

## 2013-03-03 DIAGNOSIS — M25552 Pain in left hip: Secondary | ICD-10-CM

## 2013-03-03 DIAGNOSIS — K651 Peritoneal abscess: Secondary | ICD-10-CM | POA: Diagnosis present

## 2013-03-03 DIAGNOSIS — I10 Essential (primary) hypertension: Secondary | ICD-10-CM | POA: Diagnosis present

## 2013-03-03 DIAGNOSIS — R739 Hyperglycemia, unspecified: Secondary | ICD-10-CM

## 2013-03-03 DIAGNOSIS — E785 Hyperlipidemia, unspecified: Secondary | ICD-10-CM | POA: Diagnosis present

## 2013-03-03 HISTORY — DX: Benign neoplasm of right kidney: D30.01

## 2013-03-03 HISTORY — DX: Tubulo-interstitial nephritis, not specified as acute or chronic: N12

## 2013-03-03 HISTORY — DX: Reserved for concepts with insufficient information to code with codable children: IMO0002

## 2013-03-03 LAB — CBC WITH DIFFERENTIAL/PLATELET
Basophils Absolute: 0 10*3/uL (ref 0.0–0.1)
Basophils Relative: 0 % (ref 0–1)
Eosinophils Absolute: 0.2 10*3/uL (ref 0.0–0.7)
Eosinophils Relative: 2 % (ref 0–5)
HCT: 34 % — ABNORMAL LOW (ref 39.0–52.0)
Hemoglobin: 11.6 g/dL — ABNORMAL LOW (ref 13.0–17.0)
Lymphocytes Relative: 10 % — ABNORMAL LOW (ref 12–46)
Lymphs Abs: 1.3 10*3/uL (ref 0.7–4.0)
MCH: 31.2 pg (ref 26.0–34.0)
MCHC: 34.1 g/dL (ref 30.0–36.0)
MCV: 91.4 fL (ref 78.0–100.0)
Monocytes Absolute: 1.4 10*3/uL — ABNORMAL HIGH (ref 0.1–1.0)
Monocytes Relative: 10 % (ref 3–12)
Neutro Abs: 10.6 10*3/uL — ABNORMAL HIGH (ref 1.7–7.7)
Neutrophils Relative %: 78 % — ABNORMAL HIGH (ref 43–77)
Platelets: 271 10*3/uL (ref 150–400)
RBC: 3.72 MIL/uL — ABNORMAL LOW (ref 4.22–5.81)
RDW: 13.3 % (ref 11.5–15.5)
WBC: 13.5 10*3/uL — ABNORMAL HIGH (ref 4.0–10.5)

## 2013-03-03 LAB — URINE MICROSCOPIC-ADD ON

## 2013-03-03 LAB — CG4 I-STAT (LACTIC ACID): Lactic Acid, Venous: 1.49 mmol/L (ref 0.5–2.2)

## 2013-03-03 LAB — URINALYSIS, ROUTINE W REFLEX MICROSCOPIC
Glucose, UA: NEGATIVE mg/dL
Ketones, ur: 15 mg/dL — AB
Nitrite: NEGATIVE
Protein, ur: 30 mg/dL — AB
Specific Gravity, Urine: 1.025 (ref 1.005–1.030)
Urobilinogen, UA: 1 mg/dL (ref 0.0–1.0)
pH: 5.5 (ref 5.0–8.0)

## 2013-03-03 LAB — BASIC METABOLIC PANEL
BUN: 25 mg/dL — ABNORMAL HIGH (ref 6–23)
CO2: 20 mEq/L (ref 19–32)
Calcium: 10.5 mg/dL (ref 8.4–10.5)
Chloride: 104 mEq/L (ref 96–112)
Creatinine, Ser: 2 mg/dL — ABNORMAL HIGH (ref 0.50–1.35)
GFR calc Af Amer: 37 mL/min — ABNORMAL LOW (ref >90)
GFR calc non Af Amer: 32 mL/min — ABNORMAL LOW (ref >90)
Glucose, Bld: 122 mg/dL — ABNORMAL HIGH (ref 70–99)
Potassium: 4.5 mEq/L (ref 3.7–5.3)
Sodium: 138 mEq/L (ref 137–147)

## 2013-03-03 LAB — PROTIME-INR
INR: 1.26 (ref 0.00–1.49)
Prothrombin Time: 15.5 seconds — ABNORMAL HIGH (ref 11.6–15.2)

## 2013-03-03 LAB — APTT: aPTT: 28 seconds (ref 24–37)

## 2013-03-03 MED ORDER — ONDANSETRON HCL 4 MG PO TABS
4.0000 mg | ORAL_TABLET | Freq: Four times a day (QID) | ORAL | Status: DC | PRN
Start: 2013-03-03 — End: 2013-03-06

## 2013-03-03 MED ORDER — SALINE NASAL SPRAY 0.65 % NA SOLN
1.0000 | Freq: Every day | NASAL | Status: DC | PRN
  Filled 2013-03-03: qty 30

## 2013-03-03 MED ORDER — OLMESARTAN MEDOXOMIL 20 MG PO TABS
20.0000 mg | ORAL_TABLET | Freq: Every day | ORAL | Status: DC
  Administered 2013-03-03 – 2013-03-05 (×3): 20 mg via ORAL
  Filled 2013-03-03 (×4): qty 1

## 2013-03-03 MED ORDER — SENNOSIDES-DOCUSATE SODIUM 8.6-50 MG PO TABS
1.0000 | ORAL_TABLET | Freq: Two times a day (BID) | ORAL | Status: DC
  Administered 2013-03-03 – 2013-03-05 (×5): 1 via ORAL
  Filled 2013-03-03 (×7): qty 1

## 2013-03-03 MED ORDER — ACETAMINOPHEN 500 MG PO TABS
500.0000 mg | ORAL_TABLET | Freq: Four times a day (QID) | ORAL | Status: DC | PRN
  Administered 2013-03-03 – 2013-03-05 (×4): 500 mg via ORAL
  Filled 2013-03-03 (×5): qty 1

## 2013-03-03 MED ORDER — AMLODIPINE-OLMESARTAN 5-20 MG PO TABS: 1.0000 | ORAL_TABLET | Freq: Every evening | ORAL | Status: DC

## 2013-03-03 MED ORDER — DEXTROSE 5 % IV SOLN
1.0000 g | INTRAVENOUS | Status: DC
  Administered 2013-03-04 – 2013-03-06 (×3): 1 g via INTRAVENOUS
  Filled 2013-03-03 (×3): qty 10

## 2013-03-03 MED ORDER — TRAMADOL HCL 50 MG PO TABS
50.0000 mg | ORAL_TABLET | Freq: Four times a day (QID) | ORAL | Status: DC | PRN
  Administered 2013-03-04: 100 mg via ORAL
  Filled 2013-03-03: qty 2

## 2013-03-03 MED ORDER — SIMVASTATIN 40 MG PO TABS: 40.0000 mg | ORAL_TABLET | Freq: Every day | ORAL | Status: DC

## 2013-03-03 MED ORDER — IOHEXOL 300 MG/ML  SOLN
50.0000 mL | Freq: Once | INTRAMUSCULAR | Status: AC | PRN
  Administered 2013-03-03: 50 mL via ORAL

## 2013-03-03 MED ORDER — ASPIRIN EC 81 MG PO TBEC
81.0000 mg | DELAYED_RELEASE_TABLET | Freq: Every day | ORAL | Status: DC
  Administered 2013-03-03 – 2013-03-05 (×3): 81 mg via ORAL
  Filled 2013-03-03 (×5): qty 1

## 2013-03-03 MED ORDER — PANTOPRAZOLE SODIUM 40 MG PO TBEC
40.0000 mg | DELAYED_RELEASE_TABLET | Freq: Every day | ORAL | Status: DC
  Administered 2013-03-03 – 2013-03-05 (×3): 40 mg via ORAL
  Filled 2013-03-03 (×4): qty 1

## 2013-03-03 MED ORDER — ONDANSETRON HCL 4 MG/2ML IJ SOLN: 4.0000 mg | Freq: Four times a day (QID) | INTRAMUSCULAR | Status: DC | PRN

## 2013-03-03 MED ORDER — PRAVASTATIN SODIUM 40 MG PO TABS
80.0000 mg | ORAL_TABLET | Freq: Every day | ORAL | Status: DC
  Administered 2013-03-03 – 2013-03-05 (×3): 80 mg via ORAL
  Filled 2013-03-03 (×4): qty 2

## 2013-03-03 MED ORDER — AMLODIPINE BESYLATE 5 MG PO TABS
5.0000 mg | ORAL_TABLET | Freq: Every day | ORAL | Status: DC
  Administered 2013-03-03 – 2013-03-05 (×3): 5 mg via ORAL
  Filled 2013-03-03 (×4): qty 1

## 2013-03-03 MED ORDER — POLYETHYLENE GLYCOL 3350 17 G PO PACK
17.0000 g | PACK | Freq: Every day | ORAL | Status: DC | PRN
  Filled 2013-03-03: qty 1

## 2013-03-03 MED ORDER — MIRABEGRON ER 25 MG PO TB24
25.0000 mg | ORAL_TABLET | Freq: Every day | ORAL | Status: DC
  Administered 2013-03-03 – 2013-03-05 (×3): 25 mg via ORAL
  Filled 2013-03-03 (×4): qty 1

## 2013-03-03 MED ORDER — DEXTROSE 5 % IV SOLN
1.0000 g | Freq: Once | INTRAVENOUS | Status: AC
  Administered 2013-03-03: 1 g via INTRAVENOUS
  Filled 2013-03-03: qty 10

## 2013-03-03 MED ORDER — SODIUM CHLORIDE 0.9 % IV BOLUS (SEPSIS)
1000.0000 mL | Freq: Once | INTRAVENOUS | Status: AC
  Administered 2013-03-03: 1000 mL via INTRAVENOUS

## 2013-03-03 MED ORDER — SODIUM CHLORIDE 0.9 % IV SOLN
INTRAVENOUS | Status: DC
  Administered 2013-03-03 – 2013-03-06 (×5): via INTRAVENOUS

## 2013-03-03 NOTE — ED Notes (Signed)
Pt finished all of oral contrast.  CT tech made aware.

## 2013-03-03 NOTE — ED Notes (Signed)
Pt reports foley catheter was removed on Friday.

## 2013-03-03 NOTE — ED Notes (Signed)
Lab called by RN and urine culture added onto urine.

## 2013-03-03 NOTE — ED Provider Notes (Signed)
CSN: [id number]     Arrival date & time 03/03/13  [id number] History   First MD Initiated Contact with Patient 03/03/13 [phone]     Chief Complaint  Patient presents with  . Fever  . Weakness   (Consider location/radiation/quality/duration/timing/severity/associated sxs/prior Treatment) HPI  73 year old male with fever and generalized fatigue. Patient is status post robotic-assisted laparoscopic prostatectomy, partial nephrectomy and lymphadenectomy on December 19. He was discharged with a Foley catheter. This was removed at urology follow-up on Friday. At that time he felt like he had a low-grade temperature and then yesterday fever of 101.4. He was started on Bactrim which he just finished. He has no specific urinary complaints since then. No hematuria. His abdominal pain has been progressively improving since the surgery. No cough. No shortness of breath. Mild nausea, but no vomiting. Anorexia. No diarrhea. No headaches. No sick contacts. Had his flu shot this year.  Past Medical History  Diagnosis Date  . Hyperlipidemia   . Hypertension   . LBP (low back pain) 06/2005    L5 radicular symptoms; MRI of LS spine severe spondylosis at L4-L5 with central cancal stenosis   . History of kidney stones     2007  . Spermatocele   . BPH (benign prostatic hypertrophy)   . Organic impotence   . Nocturia   . Arthritis     gen.  and left hip  . Wears glasses   . Prostate cancer 10/2011    (Low grade) [facility] - Dr. Junious Silk  . GERD (gastroesophageal reflux disease)     occasional  . Kidney tumor    Past Surgical History  Procedure Laterality Date  . Colonoscopy  02/15/2006, 2014    Internal hemorrhoids (Dr [name]), last in 2014  . Repair umbilical and ventral hernia's w/ mesh  08-21-2009  . Transurethral resection of prostate  AGE 83  . Tonsillectomy and adenoidectomy  as child  . Inguinal hernia repair Right 1980  . Spermatocelectomy Left 12/18/2012    Procedure: LEFT  SPERMATOCELECTOMY;  Surgeon: [name], MD;  Location: [facility];  Service: Urology;  Laterality: Left;  . Prostate biopsy N/A 12/18/2012    Procedure: BIOPSY TRANSRECTAL ULTRASONIC PROSTATE (TUBP);  Surgeon: [name], MD;  Location: [facility];  Service: Urology;  Laterality: N/A;  . Prostate biopsy  12/05/11    gleason 6, 3/12 cores  . Robotic assited partial nephrectomy Right 02/15/2013    Procedure: ROBOTIC ASSITED PARTIAL NEPHRECTOMY;  Surgeon: [name], MD;  Location: [facility];  Service: Urology;  Laterality: Right;  . Robot assisted laparoscopic radical prostatectomy N/A 02/15/2013    Procedure: ROBOTIC ASSISTED LAPAROSCOPIC RADICAL PROSTATECTOMY;  Surgeon: [name], MD;  Location: [facility];  Service: Urology;  Laterality: N/A;  . Lymphadenectomy Bilateral 02/15/2013    Procedure: LYMPHADENECTOMY WITH INDOCYANINE GREEN DYE INJECTION;  Surgeon: [name], MD;  Location: [facility];  Service: Urology;  Laterality: Bilateral;   Family History  Problem Relation Age of Onset  . Cancer Mother     colon  . Colon cancer Mother 27  . Pulmonary fibrosis Father     father owned a Radio broadcast assistant (father blamed inhalation of chemicals and fumes)  . Coronary artery disease Other   . Hyperlipidemia Other    History  Substance Use Topics  . Smoking status: Former Smoker -- 0.25 packs/day for 10 years    Types: Cigarettes    Quit date: 02/28/1981  . Smokeless tobacco: Never Used  .  Alcohol Use: 0.0 oz/week     Comment: occasional    Review of Systems  All systems reviewed and negative, other than as noted in HPI.   Allergies  Ace inhibitors; Codeine; and Percocet  Home Medications   Current Outpatient Rx  Name  Route  Sig  Dispense  Refill  . acetaminophen (TYLENOL) 500 MG tablet   Oral   Take 1,000 mg by mouth every 6 (six) hours as needed for moderate pain.         [name] amLODipine-olmesartan (AZOR) 5-20 MG  per tablet   Oral   Take 1 tablet by mouth every evening.         [name] aspirin EC 81 MG tablet   Oral   Take 81 mg by mouth daily.         . meloxicam (MOBIC) 15 MG tablet   Oral   Take 15 mg by mouth daily.          . mirabegron ER (MYRBETRIQ) 25 MG TB24 tablet   Oral   Take 1 tablet (25 mg total) by mouth daily.   30 tablet   0   . Multiple Vitamin (MULTIVITAMIN WITH MINERALS) TABS tablet   Oral   Take 1 tablet by mouth daily.         [name] omeprazole (PRILOSEC) 20 MG capsule   Oral   Take 20 mg by mouth every evening.         . senna-docusate (SENOKOT-S) 8.6-50 MG per tablet   Oral   Take 1 tablet by mouth 2 (two) times daily. While taking pain meds to prevent constipation   30 tablet   0   . simvastatin (ZOCOR) 40 MG tablet      take 1 tablet by mouth at bedtime   90 tablet   1   . sodium chloride (OCEAN) 0.65 % nasal spray   Nasal   Place 1 spray into the nose daily as needed for congestion.         . sulfamethoxazole-trimethoprim (BACTRIM) 400-80 MG per tablet   Oral   Take 1 tablet by mouth 2 (two) times daily. X 3 days. Start day prior to next urology appointment   6 tablet   0   . traMADol (ULTRAM) 50 MG tablet   Oral   Take 1-2 tablets (50-100 mg total) by mouth every 6 (six) hours as needed for moderate pain. Post-operatively   30 tablet   1    BP 138/59  Pulse 110  Temp(Src) 99.6 F (37.6 C) (Oral)  Resp 20  SpO2 95% Physical Exam  Nursing note and vitals reviewed. Constitutional: He appears well-developed and well-nourished. No distress.  HENT:  Head: Normocephalic and atraumatic.  Eyes: Conjunctivae are normal. Right eye exhibits no discharge. Left eye exhibits no discharge.  Neck: Neck supple.  Cardiovascular: Normal rate, regular rhythm and normal heart sounds.  Exam reveals no gallop and no friction rub.   No murmur heard. Pulmonary/Chest: Effort normal and breath sounds normal. No respiratory distress.  Abdominal: Soft. He  exhibits no distension. There is tenderness.  Abdomen soft. Mild diffuse tenderness which seems appropriate given his recent surgery. Incisions are intact and appear to be healing well.  Genitourinary:  No costovertebral angle tenderness  Musculoskeletal: He exhibits no edema and no tenderness.  Neurological: He is alert.  Skin: Skin is warm and dry.  Psychiatric: He has a normal mood and affect. His behavior is normal. Thought content normal.  ED Course  Procedures (including critical care time) Labs Review Labs Reviewed  URINALYSIS, ROUTINE W REFLEX MICROSCOPIC - Abnormal; Notable for the following:    Color, Urine AMBER (*)    APPearance CLOUDY (*)    Hgb urine dipstick SMALL (*)    Bilirubin Urine SMALL (*)    Ketones, ur 15 (*)    Protein, ur 30 (*)    Leukocytes, UA MODERATE (*)    All other components within normal limits  CBC WITH DIFFERENTIAL - Abnormal; Notable for the following:    WBC 13.5 (*)    RBC 3.72 (*)    Hemoglobin 11.6 (*)    HCT 34.0 (*)    Neutrophils Relative % 78 (*)    Neutro Abs 10.6 (*)    Lymphocytes Relative 10 (*)    Monocytes Absolute 1.4 (*)    All other components within normal limits  BASIC METABOLIC PANEL - Abnormal; Notable for the following:    Glucose, Bld 122 (*)    BUN 25 (*)    Creatinine, Ser 2.00 (*)    GFR calc non Af Amer 32 (*)    GFR calc Af Amer 37 (*)    All other components within normal limits  URINE CULTURE  URINE MICROSCOPIC-ADD ON  CG4 I-STAT (LACTIC ACID)   Imaging Review Dg Chest 2 View  03/03/2013   CLINICAL DATA:  Fever.  History of hypertension and prostate cancer.  EXAM: CHEST  2 VIEW  COMPARISON:  02/12/2013 and 03/10/2009.  FINDINGS: There are lower lung volumes with mildly increased linear atelectasis at both lung bases. No airspace disease, edema or pleural effusion is identified. The heart size and mediastinal contours are stable for the degree of inspiration. Thoracic kyphosis and spondylosis appear  unchanged.  IMPRESSION: Lower lung volumes with mildly increased linear bibasilar atelectasis. No evidence of focal airspace disease.   Electronically Signed   By: Camie Patience M.D.   On: 03/03/2013 09:52    EKG Interpretation   None       MDM   1. AKI (acute kidney injury)   2. Sepsis   3. UTI (urinary tract infection)     73 year old male with generalized fatigue and fever. Status post prostatectomy, partial nephrectomy and lympadenectomy. Suspect urinary tract infection. He is no respiratory complaints or other symptoms typical of flu. He appears tired, but not toxic. Is normotensive. Basic labs and urinalysis. CXR. IV fluids and continue to monitor while in ED.   W/u significant for worsening renal function. Likely some prerenal element with recent anorexia and increased insensible losses with fever. Pt is voiding. UA concerning for possible UTI. Technically sepsis with SIRS criteria. Not sure of how much may be related to recent surgery though. Culture sent. I do not have a source of his fever otherwise.  Symptoms progressing despite recent bactrim use and also recent instrumentation/catheter. Rocephin ordered. Imaging of abdomen/pelvis deferred at this point. Pt reports his abdominal pain has been steadily improving. His exam does not seem out of proportion of what I would expect with his recent surgery. Will discuss with medicine. Will discuss with urology because of recent surgery, but does not appear in need of acute urologic intervention.   11:40 AM Discussed with Dr Sharlette Dense, urology. Will see pt in consultation. Additionally requesting non-contrast CT a/p. Discussed with Dr Ardeth Perfect, Medicine.    Virgel Manifold, MD 03/03/13 1144

## 2013-03-03 NOTE — ED Notes (Signed)
Patient transported to CT 

## 2013-03-03 NOTE — ED Notes (Signed)
Urology MD consult at bedside for evaluation.

## 2013-03-03 NOTE — ED Notes (Signed)
MD at bedside. 

## 2013-03-03 NOTE — ED Notes (Signed)
Patient transported to X-ray 

## 2013-03-03 NOTE — ED Notes (Signed)
Pt reports he started running a fever on Friday.  Today fever is 101.4.  Tylenol last taken yesterday at 7pm.  Pt reports pain all over along with weakness.

## 2013-03-03 NOTE — Progress Notes (Addendum)
ANTIBIOTIC CONSULT NOTE - INITIAL  Pharmacy Consult for ceftriaxone Indication: UTI  Allergies  Allergen Reactions  . Ace Inhibitors Other (See Comments)    REACTION: Cough  . Codeine     Severe hiccups  . Percocet [Oxycodone-Acetaminophen]     Severe hiccups    Patient Measurements: Height: 6' (182.9 cm) Weight: 207 lb 8 oz (94.121 kg) IBW/kg (Calculated) : 77.6 Adjusted Body Weight:   Vital Signs: Temp: 99.2 F (37.3 C) (01/04 1335) Temp src: Oral (01/04 1335) BP: 156/42 mmHg (01/04 1335) Pulse Rate: 98 (01/04 1335) Intake/Output from previous day:   Intake/Output from this shift:    Labs:  Recent Labs  03/03/13 0910  WBC 13.5*  HGB 11.6*  PLT 271  CREATININE 2.00*   Estimated Creatinine Clearance: 40.3 ml/min (by C-G formula based on Cr of 2). No results found for this basename: VANCOTROUGH, VANCOPEAK, VANCORANDOM, GENTTROUGH, GENTPEAK, GENTRANDOM, TOBRATROUGH, TOBRAPEAK, TOBRARND, AMIKACINPEAK, AMIKACINTROU, AMIKACIN,  in the last 72 hours   Microbiology: No results found for this or any previous visit (from the past 720 hour(s)).  Medical History: Past Medical History  Diagnosis Date  . Hyperlipidemia   . Hypertension   . LBP (low back pain) 06/2005    L5 radicular symptoms; MRI of LS spine severe spondylosis at L4-L5 with central cancal stenosis   . History of kidney stones     2007  . Spermatocele   . BPH (benign prostatic hypertrophy)   . Organic impotence   . Nocturia   . Arthritis     gen.  and left hip  . Wears glasses   . Prostate cancer 10/2011    (Low grade) [facility] - Dr. Junious Silk  . GERD (gastroesophageal reflux disease)     occasional  . Kidney tumor     Assessment: 69 YOM with prostate cancer and R renal mass with recent (12/19) R partial nephrectomy and prostatectomy presents with fever and weakness. He completed course of prophylactic TMP/SMZ 1/3 following foley catheter removal. Starting ceftriaxone for possible  UTI.  1/4 >> ceftriaxone  >>  Tmax: 99.6 WBCs: 13.5 Renal: SCr = 2 (elevated from post-op in Dec)   1/4 urine: pending (UA consistent with UTI)   Goal of Therapy:  Appropriately dose antibiotic  Plan:   Ceftriaxone 1gm IV q24h starting tom as given 1gm dose in ED ~11am  Pharmacy to sign-off and follow peripherally as no dose adjustment need for renal fx  Doreene Eland, PharmD, BCPS.   Pager: [phone]  03/03/2013,2:11 PM

## 2013-03-03 NOTE — Consult Note (Signed)
Reason for consult: abdominal pain  HPI: David [name] is a pleasant 73 year old male with a history of prostate cancer and a right renal mass who comes to the ER with abdominal pain and is seen in consultation at request of the ER for evaluation and recommendations regarding this concern. Mr. [name] underwent a RALP and right robotic partial nephrectomy and extensive adhesiolysis on 02/15/2013. Postoperatively he had some nausea and vomiting with a possible ileus but overall he states he did well. He was feeling better when he went home. He came to clinic on Friday to get his catheter removed as noted he was having some low-grade temperatures. He was taking some pericatheter-pull Bactrim. On Saturday he was feeling more tired and noted a temp of 100.6 and when he woke up this morning his temp was now up to 101.4 and he was feeling more weak. He called me through the operator and I told him to come to the emergency room. In the ER he was noted to have a dirty urinalysis, mild leukocytosis, and creatinine of 2. The patient states he has had some right flank pain but he feels overall it has been improving. He's had an abdominal pain that feels like it getting better. He has been passing gas and having bowel movements. He did not have any nausea or vomiting until this morning. He's been passing his urine without difficulty.  Past Medical History  Diagnosis Date  . Hyperlipidemia   . Hypertension   . LBP (low back pain) 06/2005    L5 radicular symptoms; MRI of LS spine severe spondylosis at L4-L5 with central cancal stenosis   . History of kidney stones     2007  . Spermatocele   . BPH (benign prostatic hypertrophy)   . Organic impotence   . Nocturia   . Arthritis     gen.  and left hip  . Wears glasses   . Prostate cancer 10/2011    (Low grade) [facility] - Dr. Junious Silk  . GERD (gastroesophageal reflux disease)     occasional  . Kidney tumor    Past Surgical History  Procedure Laterality  Date  . Colonoscopy  02/15/2006, 2014    Internal hemorrhoids (Dr [name]), last in 2014  . Repair umbilical and ventral hernia's w/ mesh  08-21-2009  . Transurethral resection of prostate  AGE 72  . Tonsillectomy and adenoidectomy  as child  . Inguinal hernia repair Right 1980  . Spermatocelectomy Left 12/18/2012    Procedure: LEFT SPERMATOCELECTOMY;  Surgeon: [name], MD;  Location: [facility];  Service: Urology;  Laterality: Left;  . Prostate biopsy N/A 12/18/2012    Procedure: BIOPSY TRANSRECTAL ULTRASONIC PROSTATE (TUBP);  Surgeon: [name], MD;  Location: [facility];  Service: Urology;  Laterality: N/A;  . Prostate biopsy  12/05/11    gleason 6, 3/12 cores  . Robotic assited partial nephrectomy Right 02/15/2013    Procedure: ROBOTIC ASSITED PARTIAL NEPHRECTOMY;  Surgeon: [name], MD;  Location: [facility];  Service: Urology;  Laterality: Right;  . Robot assisted laparoscopic radical prostatectomy N/A 02/15/2013    Procedure: ROBOTIC ASSISTED LAPAROSCOPIC RADICAL PROSTATECTOMY;  Surgeon: [name], MD;  Location: [facility];  Service: Urology;  Laterality: N/A;  . Lymphadenectomy Bilateral 02/15/2013    Procedure: LYMPHADENECTOMY WITH INDOCYANINE GREEN DYE INJECTION;  Surgeon: [name], MD;  Location: [facility];  Service: Urology;  Laterality: Bilateral;   Allergies  Allergen Reactions  . Ace Inhibitors Other (See  Comments)    REACTION: Cough  . Codeine     Severe hiccups  . Percocet [Oxycodone-Acetaminophen]     Severe hiccups   No current facility-administered medications on file prior to encounter.   Current Outpatient Prescriptions on File Prior to Encounter  Medication Sig Dispense Refill  . amLODipine-olmesartan (AZOR) 5-20 MG per tablet Take 1 tablet by mouth every evening.      . mirabegron ER (MYRBETRIQ) 25 MG TB24 tablet Take 1 tablet (25 mg total) by mouth daily.  30 tablet  0  . omeprazole  (PRILOSEC) 20 MG capsule Take 20 mg by mouth every evening.      . senna-docusate (SENOKOT-S) 8.6-50 MG per tablet Take 1 tablet by mouth 2 (two) times daily. While taking pain meds to prevent constipation  30 tablet  0  . simvastatin (ZOCOR) 40 MG tablet take 1 tablet by mouth at bedtime  90 tablet  1  . sodium chloride (OCEAN) 0.65 % nasal spray Place 1 spray into the nose daily as needed for congestion.      . sulfamethoxazole-trimethoprim (BACTRIM) 400-80 MG per tablet Take 1 tablet by mouth 2 (two) times daily. X 3 days. Start day prior to next urology appointment  6 tablet  0  . traMADol (ULTRAM) 50 MG tablet Take 1-2 tablets (50-100 mg total) by mouth every 6 (six) hours as needed for moderate pain. Post-operatively  30 tablet  1   Family History  Problem Relation Age of Onset  . Cancer Mother     colon  . Colon cancer Mother 42  . Pulmonary fibrosis Father     father owned a Radio broadcast assistant (father blamed inhalation of chemicals and fumes)  . Coronary artery disease Other   . Hyperlipidemia Other    History   Social History  . Marital Status: Married    Spouse Name: N/A    Number of Children: N/A  . Years of Education: N/A   Occupational History  . Not on file.   Social History Main Topics  . Smoking status: Former Smoker -- 0.25 packs/day for 10 years    Types: Cigarettes    Quit date: 02/28/1981  . Smokeless tobacco: Never Used  . Alcohol Use: 0.0 oz/week     Comment: occasional  . Drug Use: No  . Sexual Activity: Not on file   Other Topics Concern  . Not on file   Social History Narrative   Married- 39 yrs   2 daughters   5 grandchildren   Never Smoked   Alcohol use-yes (occasional/social)   Retired- Engineer, water   ROS: ten point ROS is negative except for pertinent pos and negatives noted in HPI  PE: Filed Vitals:   03/03/13 1300  BP: 154/72  Pulse: 90  Temp:   Resp:   T 99.6 R 16  gen - pleasant, NAD, lying in bed belching heent-  NCAT, EOMI, oropharynx mucosa dry and pink cv - RRR Chest - breathing comfortably on RA abd - soft, minimally tender to palpation, mildly distended, + mild right CVAT, no left CVAT, no suprapubic pain gu - penis WNL, no lesions, no blood, scrotum WNL Ext - wwp Psych - appropriate mood and affect, cooperative, answers questions appropriately Musk - normal ROM in all 4 ext's Neuro - CN II-XII grossly intact  Labs: Cr. 2.0 WBC 13.5 U/a neg nitrites, mod LE, small blood  A/P: 73 year old male status post RALP, right robotic partial nephrectomy, and extensive adhesiolysis now  with fevers, mild right CVA tenderness, mild leukocytosis, abnormal urinalysis, mild abdominal pain, and AKI  - Given his recent surgery and overall picture I would like to get a oral contrasted CT scan of the abdomen and pelvis to rule out any intra-abdominal pathology that might require a procedure. It may be that his acute kidney injury is simply related to dehydration as he said he has had poor PO intake but given his recent partial nephrectomy and prostatectomy with catheter removal as well as some mild right CVA tenderness that I think is slightly more than typical for this time postop we'll get a scan to make sure there is not any urine leak or infected collection. He cannot get IV contrast because of his creatinine but hopefully the oral contrast will help.

## 2013-03-03 NOTE — H&P (Addendum)
[facility]  History and Physical David Mattila L. Ardeth Perfect, MD Pager [phone] (if 7P to 7A, page night hospitalist on [email or web address] [name] [name] DOB: Dec 26, 1940 DOA: 03/03/2013  PCP: Drema Pry, DO   Chief Complaint: Fevers and weakness   HPI:  Pt has hx of moderate risk prostate CA w/ concerning R renal neoplasm as well as HTN and HLD. underwents/p robotic rt partial nephrectomy + prostatectomy + adhesiolysis 02/15/2013,  by Dr [name]. He did suffer from mild post op ileus but this resolved. He does have hx of abd hernia repair w/ mesh. All pathology from the renal mass and surround nodes were negative for malignancy. His prostate bx showed gleason 3+4 = 7 prostate cancer.  He was discharged w/ a foley catheter   2 days ago in the outpt urology office, the foley was removed. Starting 1 day ago, he starting feeling week and noticed fever today up to 101.5. He has not had trouble voiding and is difficulty to assess new dysuria given recent removal of foley catheter. He was stopping his ppx abx yesterday that were started to prevent infection around the removal of the foley catheter. He has no other complaints today other than postop abd pain that was assessed in urology clinic on Friday and I assume was thought to be normal.   In the ED, he was afebrile but had leukocytosis of 13.5. U/A showed mod leuks and cx is pending. Pt was started on rocephin and [facility] was consulted and will see the patient. CT A/P w/o cx was ordered as well and will be performed soon    Review of Systems:  Neg except as noted above   Past Medical History  Diagnosis Date  . Hyperlipidemia   . Hypertension   . LBP (low back pain) 06/2005    L5 radicular symptoms; MRI of LS spine severe spondylosis at L4-L5 with central cancal stenosis   . History of kidney stones     2007  . Spermatocele   . BPH (benign prostatic hypertrophy)   . Organic impotence   . Nocturia   . Arthritis     gen.   and left hip  . Wears glasses   . Prostate cancer 10/2011    (Low grade) [facility] - Dr. Junious Silk  . GERD (gastroesophageal reflux disease)     occasional  . Kidney tumor     Past Surgical History  Procedure Laterality Date  . Colonoscopy  02/15/2006, 2014    Internal hemorrhoids (Dr [name]), last in 2014  . Repair umbilical and ventral hernia's w/ mesh  08-21-2009  . Transurethral resection of prostate  AGE 41  . Tonsillectomy and adenoidectomy  as child  . Inguinal hernia repair Right 1980  . Spermatocelectomy Left 12/18/2012    Procedure: LEFT SPERMATOCELECTOMY;  Surgeon: [name], MD;  [facility];  Service: Urology;  Laterality: Left;  . Prostate biopsy N/A 12/18/2012    Procedure: BIOPSY TRANSRECTAL ULTRASONIC PROSTATE (TUBP);  Surgeon: [name], MD;  Location: [facility];  Service: Urology;  Laterality: N/A;  . Prostate biopsy  12/05/11    gleason 6, 3/12 cores  . Robotic assited partial nephrectomy Right 02/15/2013    Procedure: ROBOTIC ASSITED PARTIAL NEPHRECTOMY;  Surgeon: [name], MD;  Location: [facility];  Service: Urology;  Laterality: Right;  . Robot assisted laparoscopic radical prostatectomy N/A 02/15/2013    Procedure: ROBOTIC ASSISTED LAPAROSCOPIC RADICAL PROSTATECTOMY;  Surgeon: [name], MD;  Location: [facility];  Service: Urology;  Laterality: N/A;  . Lymphadenectomy Bilateral 02/15/2013    Procedure: LYMPHADENECTOMY WITH INDOCYANINE GREEN DYE INJECTION;  Surgeon: [name], MD;  Location: [facility];  Service: Urology;  Laterality: Bilateral;    Social History:  reports that he quit smoking about 32 years ago. His smoking use included Cigarettes. He has a 2.5 pack-year smoking history. He has never used smokeless tobacco. He reports that he drinks alcohol. He reports that he does not use illicit drugs.  Allergies  Allergen Reactions  . Ace Inhibitors Other (See Comments)     REACTION: Cough  . Codeine     Severe hiccups  . Percocet [Oxycodone-Acetaminophen]     Severe hiccups    Family History  Problem Relation Age of Onset  . Cancer Mother     colon  . Colon cancer Mother 43  . Pulmonary fibrosis Father     father owned a Radio broadcast assistant (father blamed inhalation of chemicals and fumes)  . Coronary artery disease Other   . Hyperlipidemia Other      Prior to Admission medications   Medication Sig Start Date End Date Taking? Authorizing Provider  acetaminophen (TYLENOL) 500 MG tablet Take 1,000 mg by mouth every 6 (six) hours as needed for moderate pain.   Yes Historical Provider, MD  amLODipine-olmesartan (AZOR) 5-20 MG per tablet Take 1 tablet by mouth every evening.   Yes Historical Provider, MD  aspirin EC 81 MG tablet Take 81 mg by mouth daily.   Yes Historical Provider, MD  meloxicam (MOBIC) 15 MG tablet Take 15 mg by mouth daily.  02/26/13  Yes Historical Provider, MD  mirabegron ER (MYRBETRIQ) 25 MG TB24 tablet Take 1 tablet (25 mg total) by mouth daily. 01/11/13  Yes Doe-Hyun Kyra Searles, DO  Multiple Vitamin (MULTIVITAMIN WITH MINERALS) TABS tablet Take 1 tablet by mouth daily.   Yes Historical Provider, MD  omeprazole (PRILOSEC) 20 MG capsule Take 20 mg by mouth every evening. 11/23/12  Yes Doe-Hyun R Shawna Orleans, DO  senna-docusate (SENOKOT-S) 8.6-50 MG per tablet Take 1 tablet by mouth 2 (two) times daily. While taking pain meds to prevent constipation 02/19/13  Yes [name], MD  simvastatin (ZOCOR) 40 MG tablet take 1 tablet by mouth at bedtime 02/16/13  Yes Doe-Hyun R Shawna Orleans, DO  sodium chloride (OCEAN) 0.65 % nasal spray Place 1 spray into the nose daily as needed for congestion.   Yes Historical Provider, MD  sulfamethoxazole-trimethoprim (BACTRIM) 400-80 MG per tablet Take 1 tablet by mouth 2 (two) times daily. X 3 days. Start day prior to next urology appointment 02/19/13  Yes [name], MD  traMADol (ULTRAM) 50 MG tablet Take 1-2 tablets  (50-100 mg total) by mouth every 6 (six) hours as needed for moderate pain. Post-operatively 02/19/13  Yes [name], MD   Physical Exam: Filed Vitals:   03/03/13 0831 03/03/13 0944 03/03/13 1103 03/03/13 1104  BP: 138/59 122/70 143/64   Pulse: 110 98  100  Temp: 99.6 F (37.6 C)     TempSrc: Oral     Resp: 20 18 17    SpO2: 95% 98%  96%     General:  WM in NAD   Eyes: anicteric , moist  ENT, neck: trachea midline, no LAD, MMM   Cardiovascular: RRR, no MRG   Respiratory: CTAB, no wheezes, rales   Abdomen: multiple healing scars , mildly TTP diffusely   Skin: warm, dry   Musculoskeletal: no focal deficits  Neurologic:  no focal deficits   Wt Readings from Last 3 Encounters:  02/15/13 101.969 kg (224 lb 12.8 oz)  02/15/13 101.969 kg (224 lb 12.8 oz)  02/12/13 97.07 kg (214 lb)    Labs on Admission:  Basic Metabolic Panel:  Recent Labs Lab 03/03/13 0910  NA 138  K 4.5  CL 104  CO2 20  GLUCOSE 122*  BUN 25*  CREATININE 2.00*  CALCIUM 10.5    Liver Function Tests: No results found for this basename: AST, ALT, ALKPHOS, BILITOT, PROT, ALBUMIN,  in the last 168 hours No results found for this basename: LIPASE, AMYLASE,  in the last 168 hours No results found for this basename: AMMONIA,  in the last 168 hours  CBC:  Recent Labs Lab 03/03/13 0910  WBC 13.5*  NEUTROABS 10.6*  HGB 11.6*  HCT 34.0*  MCV 91.4  PLT 271    Cardiac Enzymes: No results found for this basename: CKTOTAL, CKMB, CKMBINDEX, TROPONINI,  in the last 168 hours  Troponin (Point of Care Test) No results found for this basename: TROPIPOC,  in the last 72 hours  BNP (last 3 results) No results found for this basename: PROBNP,  in the last 8760 hours  CBG: No results found for this basename: GLUCAP,  in the last 168 hours   Radiological Exams on Admission: Dg Chest 2 View  03/03/2013   CLINICAL DATA:  Fever.  History of hypertension and prostate cancer.  EXAM: CHEST  2 VIEW   COMPARISON:  02/12/2013 and 03/10/2009.  FINDINGS: There are lower lung volumes with mildly increased linear atelectasis at both lung bases. No airspace disease, edema or pleural effusion is identified. The heart size and mediastinal contours are stable for the degree of inspiration. Thoracic kyphosis and spondylosis appear unchanged.  IMPRESSION: Lower lung volumes with mildly increased linear bibasilar atelectasis. No evidence of focal airspace disease.   Electronically Signed   By: Camie Patience M.D.   On: 03/03/2013 09:52       Active Problems:   AKI (acute kidney injury)   Urinary tract infection   Assessment/Plan 1. UTI w/ recent prostatectomy/partial nephrectomy - cont rocephin. IVF at 75cc/hr and remain NPO. CT A/P shows perinephric fluid collection. Concern for urinary leak postop w/ peritoneal reabsorption causing elevated Cr. Urology contacted IR who will be placing perc drain. NPO after MN as well in case no improvement overnight  2. AKI in setting of UTI - hydrate as above. Appreciate urology input. Treating UTI.   3. HTN - cont home meds  4. HLD - cont home meds  5. PPx - SCDs. Holding on lovenox as we w/u this condition, given surgery my be needed if complications are found   Code Status: full Family Communication: none Disposition Plan/Anticipated LOS: 3-5 days   Time spent: 83 minutes  Velna Hatchet, MD  Internal Medicine Pager [phone] If 7PM-7AM, please contact night-coverage at [email or web address], password [email or web address] 03/03/2013, 12:13 PM

## 2013-03-04 ENCOUNTER — Inpatient Hospital Stay (HOSPITAL_COMMUNITY): Payer: Medicare Other

## 2013-03-04 ENCOUNTER — Encounter (HOSPITAL_COMMUNITY): Payer: Self-pay | Admitting: Radiology

## 2013-03-04 DIAGNOSIS — A419 Sepsis, unspecified organism: Secondary | ICD-10-CM

## 2013-03-04 LAB — CBC
HEMATOCRIT: 33.2 % — AB (ref 39.0–52.0)
Hemoglobin: 11 g/dL — ABNORMAL LOW (ref 13.0–17.0)
MCH: 30.6 pg (ref 26.0–34.0)
MCHC: 33.1 g/dL (ref 30.0–36.0)
MCV: 92.2 fL (ref 78.0–100.0)
Platelets: 248 10*3/uL (ref 150–400)
RBC: 3.6 MIL/uL — AB (ref 4.22–5.81)
RDW: 13.2 % (ref 11.5–15.5)
WBC: 11.8 10*3/uL — AB (ref 4.0–10.5)

## 2013-03-04 LAB — BASIC METABOLIC PANEL
BUN: 24 mg/dL — ABNORMAL HIGH (ref 6–23)
CALCIUM: 9.9 mg/dL (ref 8.4–10.5)
CHLORIDE: 105 meq/L (ref 96–112)
CO2: 20 meq/L (ref 19–32)
CREATININE: 1.91 mg/dL — AB (ref 0.50–1.35)
GFR calc Af Amer: 39 mL/min — ABNORMAL LOW (ref >90)
GFR calc non Af Amer: 34 mL/min — ABNORMAL LOW (ref >90)
Glucose, Bld: 99 mg/dL (ref 70–99)
Potassium: 4.4 mEq/L (ref 3.7–5.3)
Sodium: 138 mEq/L (ref 137–147)

## 2013-03-04 LAB — URINE CULTURE
Colony Count: NO GROWTH
Culture: NO GROWTH

## 2013-03-04 MED ORDER — FENTANYL CITRATE 0.05 MG/ML IJ SOLN
INTRAMUSCULAR | Status: AC
  Filled 2013-03-04: qty 4

## 2013-03-04 MED ORDER — FENTANYL CITRATE 0.05 MG/ML IJ SOLN
INTRAMUSCULAR | Status: AC | PRN
  Administered 2013-03-04 (×2): 25 ug via INTRAVENOUS
  Administered 2013-03-04 (×3): 50 ug via INTRAVENOUS

## 2013-03-04 MED ORDER — FENTANYL CITRATE 0.05 MG/ML IJ SOLN
INTRAMUSCULAR | Status: AC
  Filled 2013-03-04: qty 6

## 2013-03-04 MED ORDER — MIDAZOLAM HCL 2 MG/2ML IJ SOLN
INTRAMUSCULAR | Status: AC
  Filled 2013-03-04: qty 6

## 2013-03-04 MED ORDER — MIDAZOLAM HCL 2 MG/2ML IJ SOLN
INTRAMUSCULAR | Status: AC | PRN
  Administered 2013-03-04: 11:00:00 1 mg via INTRAVENOUS
  Administered 2013-03-04 (×3): 0.5 mg via INTRAVENOUS
  Administered 2013-03-04: 11:00:00 1 mg via INTRAVENOUS
  Administered 2013-03-04: 0.5 mg via INTRAVENOUS
  Administered 2013-03-04: 10:00:00 1 mg via INTRAVENOUS

## 2013-03-04 MED ORDER — MIDAZOLAM HCL 2 MG/2ML IJ SOLN
INTRAMUSCULAR | Status: AC
  Filled 2013-03-04: qty 4

## 2013-03-04 MED ORDER — LIDOCAINE HCL 1 % IJ SOLN
INTRAMUSCULAR | Status: AC
  Filled 2013-03-04: qty 20

## 2013-03-04 MED ORDER — IOHEXOL 300 MG/ML  SOLN
INTRAMUSCULAR | Status: AC | PRN
  Administered 2013-03-04: 1 mL

## 2013-03-04 NOTE — Procedures (Signed)
R urinoma drain 10 Fr R PCN 10 Fr R ureteral stent No comp

## 2013-03-04 NOTE — Progress Notes (Signed)
Brief Interventional Radiology Note  Contacted by Dr. Sharlette Dense of Urology regarding [name].  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  Dr. Sharlette Dense requested a drain be placed this evening.  Unfortunately, this could not be accomplished secondary to logistical problems. Although [name] does need drained as soon as possible, I think he is stable enough for tonight.  We will arrange for first thing tomorrow in prioritized fashion.  I called Dr. Sharlette Dense back and let him know of our plan.   Signed,  Criselda Peaches, MD [facility]

## 2013-03-04 NOTE — Progress Notes (Signed)
Subjective: 73 year old gentleman with acute kidney injury after recent partial nephrectomy. Patient was also found to have evidence of pyelonephritis on admission. His recent evaluation showed intra-abdominal abscess in the area of his recent surgery and he has undergone abscess drainage today by interventional radiology. He is feeling better now. No fever, no abdominal pain, no nausea or vomiting. He had history of prostate cancer among other things but no symptoms at this point.  Objective: Vital signs in last 24 hours: Temp:  [99.2 F (37.3 C)-100.4 F (38 C)] 100.1 F (37.8 C) (01/05 1200) Pulse Rate:  [87-106] 87 (01/05 1200) Resp:  [18-28] 20 (01/05 1200) BP: (111-160)/(42-95) 111/55 mmHg (01/05 1200) SpO2:  [95 %-98 %] 95 % (01/05 1200) Weight:  [93.532 kg (206 lb 3.2 oz)-94.121 kg (207 lb 8 oz)] 93.532 kg (206 lb 3.2 oz) (01/05 0617) Weight change:  Last BM Date: 03/03/13  Intake/Output from previous day: 01/04 0701 - 01/05 0700 In: 2100 [P.O.:1200; I.V.:900] Out: 900 [Urine:900] Intake/Output this shift: Total I/O In: -  Out: 500 [Urine:500]  General appearance: alert, cooperative and no distress Eyes: conjunctivae/corneas clear. PERRL, EOM's intact. Fundi benign. Neck: no adenopathy, no carotid bruit, no JVD, supple, symmetrical, trachea midline and thyroid not enlarged, symmetric, no tenderness/mass/nodules Back: symmetric, no curvature. ROM normal. No CVA tenderness. Resp: clear to auscultation bilaterally Chest wall: no tenderness Cardio: regular rate and rhythm, S1, S2 normal, no murmur, click, rub or gallop GI: soft, non-tender; bowel sounds normal; no masses,  no organomegaly and Drainage tubes on the right side of the abdomen draining clear as well as the blood stained fluid Extremities: extremities normal, atraumatic, no cyanosis or edema Pulses: 2+ and symmetric Skin: Skin color, texture, turgor normal. No rashes or lesions Neurologic: Grossly  normal Incision/Wound:  Lab Results:  Recent Labs  03/03/13 0910 03/04/13 0515  WBC 13.5* 11.8*  HGB 11.6* 11.0*  HCT 34.0* 33.2*  PLT 271 248   BMET  Recent Labs  03/03/13 0910 03/04/13 0515  NA 138 138  K 4.5 4.4  CL 104 105  CO2 20 20  GLUCOSE 122* 99  BUN 25* 24*  CREATININE 2.00* 1.91*  CALCIUM 10.5 9.9    Studies/Results: Ct Abdomen Pelvis Wo Contrast  03/03/2013   CLINICAL DATA:  Fever in weakness. Prior prostatectomy for prostate cancer. Partial right nephrectomy/lymphadenectomy 02/15/2013.  EXAM: CT ABDOMEN AND PELVIS WITHOUT CONTRAST  TECHNIQUE: Multidetector CT imaging of the abdomen and pelvis was performed following the standard protocol without intravenous contrast.  COMPARISON:  01/18/2013 and 04/07/2005  FINDINGS: Lung bases demonstrate a small right pleural effusion with associated atelectasis which is new. The minimal calcification over the left anterior descending and lateral circumflex arteries.  Abdominal images demonstrate a normal liver, spleen, pancreas, gallbladder and adrenal glands. Left kidney is normal in size with a few small stones the largest over the upper pole measuring 5 mm. The left ureter is normal. The right kidney is normal in size with a couple small 1-2 mm nonobstructing stones. There is mild right-sided hydronephrosis. There is a 7.4 x 9.6 cm predominately simple appearing fluid collection adjacent to an immediately below the inferior pole of the right kidney. There is stranding of the right perinephric fat. The proximal right ureter courses through some of this fluid collection as the ureter is otherwise within normal. The appendix is normal. No evidence of bowel obstruction.  There postsurgical changes over the umbilical region. Pelvic images demonstrate surgical absence of the prostate. Small left inguinal  hernia containing only mesenteric fat. Remainder of the exam is unchanged.  IMPRESSION: 7.4 x 9.6 cm predominately simple appearing fluid  collection adjacent to and inferior to the lower pole of the right kidney. Patient underwent recent partial right nephrectomy of lower pole exophytic mass 02/15/2013 as this postoperative fluid collection may be infectious versus noninfectious in nature. Proximal right ureter courses through the anterior medial aspect of this fluid collection as there is mild dilatation of the right intrarenal collecting system.  Bilateral nephrolithiasis.  Small moderate pleural fluid with associated atelectasis.  Prior prostatectomy.   Electronically Signed   By: Marin Olp M.D.   On: 03/03/2013 13:11   Dg Chest 2 View  03/03/2013   CLINICAL DATA:  Fever.  History of hypertension and prostate cancer.  EXAM: CHEST  2 VIEW  COMPARISON:  02/12/2013 and 03/10/2009.  FINDINGS: There are lower lung volumes with mildly increased linear atelectasis at both lung bases. No airspace disease, edema or pleural effusion is identified. The heart size and mediastinal contours are stable for the degree of inspiration. Thoracic kyphosis and spondylosis appear unchanged.  IMPRESSION: Lower lung volumes with mildly increased linear bibasilar atelectasis. No evidence of focal airspace disease.   Electronically Signed   By: Camie Patience M.D.   On: 03/03/2013 09:52    Medications: I have reviewed the patient's current medications.  Assessment/Plan: A 73 year old gentleman with intra-abdominal perinephric abscess postoperatively.  #1 perinephric abscess: Postoperatively. Patient is on IV antibiotics and has a drain in place. We will continue with antibiotics await culture and sensitivities and adjust antibiotics accordingly.  #2 acute kidney injury: Most likely related to his abscess and obstruction. Dehydration is quite possible as well from an previous nausea vomiting. We'll continue to hydrate the patient and follow renal function.  #3 pyelonephritis: Most likely related to #1. Continue antibiotics  #4 hypertension: Blood pressure  seems well controlled now.  #5 hyperlipidemia: We'll continue home medication.  LOS: 1 day   Phenix Grein,LAWAL 03/04/2013, 12:23 PM

## 2013-03-04 NOTE — Consult Note (Signed)
HPI: David [name] is an 73 y.o. male who recently underwent robotic assisted partial right nephrectomy. 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 IR is asked to drain this collection. There may be concern for ongoing leak and after discussion with Dr. [name], Dr. Barbie Banner feels PCN placement would be favorable as a means of urinary diversion. Chart, PMHx, meds reviewed.  Past Medical History:  Past Medical History  Diagnosis Date  . Hyperlipidemia   . Hypertension   . LBP (low back pain) 06/2005    L5 radicular symptoms; MRI of LS spine severe spondylosis at L4-L5 with central cancal stenosis   . History of kidney stones     2007  . Spermatocele   . BPH (benign prostatic hypertrophy)   . Organic impotence   . Nocturia   . Arthritis     gen.  and left hip  . Wears glasses   . Prostate cancer 10/2011    (Low grade) [facility] - Dr. Junious Silk  . GERD (gastroesophageal reflux disease)     occasional  . Kidney tumor     Past Surgical History:  Past Surgical History  Procedure Laterality Date  . Colonoscopy  02/15/2006, 2014    Internal hemorrhoids (Dr [name]), last in 2014  . Repair umbilical and ventral hernia's w/ mesh  08-21-2009  . Transurethral resection of prostate  AGE 55  . Tonsillectomy and adenoidectomy  as child  . Inguinal hernia repair Right 1980  . Spermatocelectomy Left 12/18/2012    Procedure: LEFT SPERMATOCELECTOMY;  Surgeon: [name], MD;  Location: [facility];  Service: Urology;  Laterality: Left;  . Prostate biopsy N/A 12/18/2012    Procedure: BIOPSY TRANSRECTAL ULTRASONIC PROSTATE (TUBP);  Surgeon: [name], MD;  Location: [facility];  Service: Urology;  Laterality: N/A;  . Prostate biopsy  12/05/11    gleason 6, 3/12 cores  . Robotic assited partial nephrectomy Right 02/15/2013    Procedure: ROBOTIC ASSITED PARTIAL NEPHRECTOMY;  Surgeon: [name], MD;   Location: [facility];  Service: Urology;  Laterality: Right;  . Robot assisted laparoscopic radical prostatectomy N/A 02/15/2013    Procedure: ROBOTIC ASSISTED LAPAROSCOPIC RADICAL PROSTATECTOMY;  Surgeon: [name], MD;  Location: [facility];  Service: Urology;  Laterality: N/A;  . Lymphadenectomy Bilateral 02/15/2013    Procedure: LYMPHADENECTOMY WITH INDOCYANINE GREEN DYE INJECTION;  Surgeon: [name], MD;  Location: [facility];  Service: Urology;  Laterality: Bilateral;    Family History:  Family History  Problem Relation Age of Onset  . Cancer Mother     colon  . Colon cancer Mother 62  . Pulmonary fibrosis Father     father owned a Radio broadcast assistant (father blamed inhalation of chemicals and fumes)  . Coronary artery disease Other   . Hyperlipidemia Other     Social History:  reports that he quit smoking about 32 years ago. His smoking use included Cigarettes. He has a 2.5 pack-year smoking history. He has never used smokeless tobacco. He reports that he drinks alcohol. He reports that he does not use illicit drugs.  Allergies:  Allergies  Allergen Reactions  . Ace Inhibitors Other (See Comments)    REACTION: Cough  . Codeine     Severe hiccups  . Percocet [Oxycodone-Acetaminophen]     Severe hiccups    Medications:   Medication List    ASK your doctor about these medications       acetaminophen 500 MG tablet  Commonly known as:  TYLENOL  Take 1,000 mg by mouth every 6 (six) hours as needed for moderate pain.     aspirin EC 81 MG tablet  Take 81 mg by mouth daily.     AZOR 5-20 MG per tablet  Generic drug:  amLODipine-olmesartan  Take 1 tablet by mouth every evening.     meloxicam 15 MG tablet  Commonly known as:  MOBIC  Take 15 mg by mouth daily.     mirabegron ER 25 MG Tb24 tablet  Commonly known as:  MYRBETRIQ  Take 1 tablet (25 mg total) by mouth daily.     multivitamin with minerals Tabs tablet  Take 1 tablet by mouth daily.     omeprazole 20 MG  capsule  Commonly known as:  PRILOSEC  Take 20 mg by mouth every evening.     senna-docusate 8.6-50 MG per tablet  Commonly known as:  Senokot-S  Take 1 tablet by mouth 2 (two) times daily. While taking pain meds to prevent constipation     simvastatin 40 MG tablet  Commonly known as:  ZOCOR  take 1 tablet by mouth at bedtime     sodium chloride 0.65 % nasal spray  Commonly known as:  OCEAN  Place 1 spray into the nose daily as needed for congestion.     sulfamethoxazole-trimethoprim 400-80 MG per tablet  Commonly known as:  BACTRIM  Take 1 tablet by mouth 2 (two) times daily. X 3 days. Start day prior to next urology appointment     traMADol 50 MG tablet  Commonly known as:  ULTRAM  Take 1-2 tablets (50-100 mg total) by mouth every 6 (six) hours as needed for moderate pain. Post-operatively        Please HPI for pertinent positives, otherwise complete 10 system ROS negative.  Physical Exam: BP 132/66  Pulse 97  Temp(Src) 99.2 F (37.3 C) (Oral)  Resp 18  Ht 6' (1.829 m)  Wt 206 lb 3.2 oz (93.532 kg)  BMI 27.96 kg/m2  SpO2 98% Body mass index is 27.96 kg/(m^2).   General Appearance:  Alert, cooperative, no distress, appears stated age  Head:  Normocephalic, without obvious abnormality, atraumatic  ENT: Unremarkable  Neck: Supple, symmetrical, trachea midline  Lungs:   Clear to auscultation bilaterally, no w/r/r, respirations unlabored without use of accessory muscles.  Chest Wall:  No tenderness or deformity  Heart:  Regular rate and rhythm, S1, S2 normal, no murmur, rub or gallop.  Abdomen:   Soft, tender right flank  Neurologic: Normal affect, no gross deficits.   Results for orders placed during the hospital encounter of 03/03/13 (from the past 48 hour(s))  CBC WITH DIFFERENTIAL     Status: Abnormal   Collection Time    03/03/13  9:10 AM      Result Value Range   WBC 13.5 (*) 4.0 - 10.5 K/uL   RBC 3.72 (*) 4.22 - 5.81 MIL/uL   Hemoglobin 11.6 (*) 13.0 -  17.0 g/dL   HCT 34.0 (*) 39.0 - 52.0 %   MCV 91.4  78.0 - 100.0 fL   MCH 31.2  26.0 - 34.0 pg   MCHC 34.1  30.0 - 36.0 g/dL   RDW 13.3  11.5 - 15.5 %   Platelets 271  150 - 400 K/uL   Neutrophils Relative % 78 (*) 43 - 77 %   Neutro Abs 10.6 (*) 1.7 - 7.7 K/uL   Lymphocytes Relative 10 (*) 12 - 46 %  Lymphs Abs 1.3  0.7 - 4.0 K/uL   Monocytes Relative 10  3 - 12 %   Monocytes Absolute 1.4 (*) 0.1 - 1.0 K/uL   Eosinophils Relative 2  0 - 5 %   Eosinophils Absolute 0.2  0.0 - 0.7 K/uL   Basophils Relative 0  0 - 1 %   Basophils Absolute 0.0  0.0 - 0.1 K/uL  BASIC METABOLIC PANEL     Status: Abnormal   Collection Time    03/03/13  9:10 AM      Result Value Range   Sodium 138  137 - 147 mEq/L   Comment: Please note change in reference range.   Potassium 4.5  3.7 - 5.3 mEq/L   Comment: Please note change in reference range.   Chloride 104  96 - 112 mEq/L   CO2 20  19 - 32 mEq/L   Glucose, Bld 122 (*) 70 - 99 mg/dL   BUN 25 (*) 6 - 23 mg/dL   Creatinine, Ser 2.00 (*) 0.50 - 1.35 mg/dL   Calcium 10.5  8.4 - 10.5 mg/dL   GFR calc non Af Amer 32 (*) >90 mL/min   GFR calc Af Amer 37 (*) >90 mL/min   Comment: (NOTE)     The eGFR has been calculated using the CKD EPI equation.     This calculation has not been validated in all clinical situations.     eGFR's persistently <90 mL/min signify possible Chronic Kidney     Disease.  CG4 I-STAT (LACTIC ACID)     Status: None   Collection Time    03/03/13  9:23 AM      Result Value Range   Lactic Acid, Venous 1.49  0.5 - 2.2 mmol/L  URINALYSIS, ROUTINE W REFLEX MICROSCOPIC     Status: Abnormal   Collection Time    03/03/13  9:38 AM      Result Value Range   Color, Urine AMBER (*) YELLOW   Comment: BIOCHEMICALS MAY BE AFFECTED BY COLOR   APPearance CLOUDY (*) CLEAR   Specific Gravity, Urine 1.025  1.005 - 1.030   pH 5.5  5.0 - 8.0   Glucose, UA NEGATIVE  NEGATIVE mg/dL   Hgb urine dipstick SMALL (*) NEGATIVE   Bilirubin Urine SMALL  (*) NEGATIVE   Ketones, ur 15 (*) NEGATIVE mg/dL   Protein, ur 30 (*) NEGATIVE mg/dL   Urobilinogen, UA 1.0  0.0 - 1.0 mg/dL   Nitrite NEGATIVE  NEGATIVE   Leukocytes, UA MODERATE (*) NEGATIVE  URINE MICROSCOPIC-ADD ON     Status: None   Collection Time    03/03/13  9:38 AM      Result Value Range   Squamous Epithelial / LPF RARE  RARE   WBC, UA 21-50  <3 WBC/hpf   RBC / HPF 0-2  <3 RBC/hpf  APTT     Status: None   Collection Time    03/03/13  3:12 PM      Result Value Range   aPTT 28  24 - 37 seconds  PROTIME-INR     Status: Abnormal   Collection Time    03/03/13  3:12 PM      Result Value Range   Prothrombin Time 15.5 (*) 11.6 - 15.2 seconds   INR 1.26  0.00 - 8.14  BASIC METABOLIC PANEL     Status: Abnormal   Collection Time    03/04/13  5:15 AM      Result Value Range  Sodium 138  137 - 147 mEq/L   Comment: Please note change in reference range.   Potassium 4.4  3.7 - 5.3 mEq/L   Comment: Please note change in reference range.   Chloride 105  96 - 112 mEq/L   CO2 20  19 - 32 mEq/L   Glucose, Bld 99  70 - 99 mg/dL   BUN 24 (*) 6 - 23 mg/dL   Creatinine, Ser 1.91 (*) 0.50 - 1.35 mg/dL   Calcium 9.9  8.4 - 10.5 mg/dL   GFR calc non Af Amer 34 (*) >90 mL/min   GFR calc Af Amer 39 (*) >90 mL/min   Comment: (NOTE)     The eGFR has been calculated using the CKD EPI equation.     This calculation has not been validated in all clinical situations.     eGFR's persistently <90 mL/min signify possible Chronic Kidney     Disease.  CBC     Status: Abnormal   Collection Time    03/04/13  5:15 AM      Result Value Range   WBC 11.8 (*) 4.0 - 10.5 K/uL   RBC 3.60 (*) 4.22 - 5.81 MIL/uL   Hemoglobin 11.0 (*) 13.0 - 17.0 g/dL   HCT 33.2 (*) 39.0 - 52.0 %   MCV 92.2  78.0 - 100.0 fL   MCH 30.6  26.0 - 34.0 pg   MCHC 33.1  30.0 - 36.0 g/dL   RDW 13.2  11.5 - 15.5 %   Platelets 248  150 - 400 K/uL   Ct Abdomen Pelvis Wo Contrast  03/03/2013   CLINICAL DATA:  Fever in  weakness. Prior prostatectomy for prostate cancer. Partial right nephrectomy/lymphadenectomy 02/15/2013.  EXAM: CT ABDOMEN AND PELVIS WITHOUT CONTRAST  TECHNIQUE: Multidetector CT imaging of the abdomen and pelvis was performed following the standard protocol without intravenous contrast.  COMPARISON:  01/18/2013 and 04/07/2005  FINDINGS: Lung bases demonstrate a small right pleural effusion with associated atelectasis which is new. The minimal calcification over the left anterior descending and lateral circumflex arteries.  Abdominal images demonstrate a normal liver, spleen, pancreas, gallbladder and adrenal glands. Left kidney is normal in size with a few small stones the largest over the upper pole measuring 5 mm. The left ureter is normal. The right kidney is normal in size with a couple small 1-2 mm nonobstructing stones. There is mild right-sided hydronephrosis. There is a 7.4 x 9.6 cm predominately simple appearing fluid collection adjacent to an immediately below the inferior pole of the right kidney. There is stranding of the right perinephric fat. The proximal right ureter courses through some of this fluid collection as the ureter is otherwise within normal. The appendix is normal. No evidence of bowel obstruction.  There postsurgical changes over the umbilical region. Pelvic images demonstrate surgical absence of the prostate. Small left inguinal hernia containing only mesenteric fat. Remainder of the exam is unchanged.  IMPRESSION: 7.4 x 9.6 cm predominately simple appearing fluid collection adjacent to and inferior to the lower pole of the right kidney. Patient underwent recent partial right nephrectomy of lower pole exophytic mass 02/15/2013 as this postoperative fluid collection may be infectious versus noninfectious in nature. Proximal right ureter courses through the anterior medial aspect of this fluid collection as there is mild dilatation of the right intrarenal collecting system.  Bilateral  nephrolithiasis.  Small moderate pleural fluid with associated atelectasis.  Prior prostatectomy.   Electronically Signed   By: Marin Olp M.D.  On: 03/03/2013 13:11   Dg Chest 2 View  03/03/2013   CLINICAL DATA:  Fever.  History of hypertension and prostate cancer.  EXAM: CHEST  2 VIEW  COMPARISON:  02/12/2013 and 03/10/2009.  FINDINGS: There are lower lung volumes with mildly increased linear atelectasis at both lung bases. No airspace disease, edema or pleural effusion is identified. The heart size and mediastinal contours are stable for the degree of inspiration. Thoracic kyphosis and spondylosis appear unchanged.  IMPRESSION: Lower lung volumes with mildly increased linear bibasilar atelectasis. No evidence of focal airspace disease.   Electronically Signed   By: Camie Patience M.D.   On: 03/03/2013 09:52    Assessment/Plan Right perinephric collection/infected urinoma. Discussed plan for drain placement and subsequent R PCN placement for urinary diversion. Explained this to pt and wife who are agreeable with plan. Labs reviewed. Procedure, risks, complications discussed. Consent signed in chart  Ascencion Dike PA-C 03/04/2013, 9:16 AM

## 2013-03-04 NOTE — Progress Notes (Signed)
Subjective:  1- Moderate Risk Prostate Cancer - s/p radical prostatectomy 01/2013 for [id number] Gleason 3+4=7 adenocarcinoma, margins negative. Was previously on surveillance for low-risk disease that had progressed by protocol-biopsy.   2 - Right Renal Oncocytoma - s/p right robotic partial nephrectomy 01/2013 for 3.5cm lower pole enhancing mass, path confirmed benign oncocytoma.   3 - Rt Retroperitoneal Urinoma / Ureteral Leak - Pt admitted 1/4 and found to have right retroperitoneal urinoma and elevated Cr worrisome for urine leak from recent partial nephrectomy. He has minimal drain output and normal Cr when discharged postop. IR placed urinoma drain 1/5 and also rt nephrostomy + JJ ureteral stent, found to have extravasation from mid ureter (not at level of prior renorrhaphy).  Today David [name] is seen in f/u above. He is feeling more vigorous after IV hydration and initial ABX.  Objective: Vital signs in last 24 hours: Temp:  [99.2 F (37.3 C)-100.4 F (38 C)] 100.1 F (37.8 C) (01/05 1200) Pulse Rate:  [87-106] 87 (01/05 1200) Resp:  [18-28] 20 (01/05 1200) BP: (111-160)/(42-95) 111/55 mmHg (01/05 1200) SpO2:  [95 %-98 %] 95 % (01/05 1200) Weight:  [93.532 kg (206 lb 3.2 oz)-94.121 kg (207 lb 8 oz)] 93.532 kg (206 lb 3.2 oz) (01/05 0617) Last BM Date: 03/03/13  Intake/Output from previous day: 01/04 0701 - 01/05 0700 In: 2100 [P.O.:1200; I.V.:900] Out: 900 [Urine:900] Intake/Output this shift: Total I/O In: -  Out: 500 [Urine:500]  General appearance: alert, cooperative and appears stated age Head: Normocephalic, without obvious abnormality, atraumatic Eyes: conjunctivae/corneas clear. PERRL, EOM's intact. Fundi benign. Ears: normal TM's and external ear canals both ears Nose: Nares normal. Septum midline. Mucosa normal. No drainage or sinus tenderness. Throat: lips, mucosa, and tongue normal; teeth and gums normal Neck: no adenopathy, no carotid bruit, no JVD, supple,  symmetrical, trachea midline and thyroid not enlarged, symmetric, no tenderness/mass/nodules Back: symmetric, no curvature. ROM normal. No CVA tenderness. Resp: clear to auscultation bilaterally Chest wall: no tenderness Cardio: regular rate and rhythm, S1, S2 normal, no murmur, click, rub or gallop GI: soft, non-tender; bowel sounds normal; no masses,  no organomegaly Male genitalia: normal, in depends for post-prostatectomy incontinence Extremities: extremities normal, atraumatic, no cyanosis or edema Pulses: 2+ and symmetric Skin: Skin color, texture, turgor normal. No rashes or lesions Lymph nodes: Cervical, supraclavicular, and axillary nodes normal. Neurologic: Grossly normal Incision/Wound: Recent port sites / extraction sites c/d/i. Rt neph tube in place c/d/i with clear urine, urinoma drian in place.   Lab Results:   Recent Labs  03/03/13 0910 03/04/13 0515  WBC 13.5* 11.8*  HGB 11.6* 11.0*  HCT 34.0* 33.2*  PLT 271 248   BMET  Recent Labs  03/03/13 0910 03/04/13 0515  NA 138 138  K 4.5 4.4  CL 104 105  CO2 20 20  GLUCOSE 122* 99  BUN 25* 24*  CREATININE 2.00* 1.91*  CALCIUM 10.5 9.9   PT/INR  Recent Labs  03/03/13 1512  LABPROT 15.5*  INR 1.26   ABG No results found for this basename: PHART, PCO2, PO2, HCO3,  in the last 72 hours  Studies/Results: Ct Abdomen Pelvis Wo Contrast  03/03/2013   CLINICAL DATA:  Fever in weakness. Prior prostatectomy for prostate cancer. Partial right nephrectomy/lymphadenectomy 02/15/2013.  EXAM: CT ABDOMEN AND PELVIS WITHOUT CONTRAST  TECHNIQUE: Multidetector CT imaging of the abdomen and pelvis was performed following the standard protocol without intravenous contrast.  COMPARISON:  01/18/2013 and 04/07/2005  FINDINGS: Lung bases demonstrate a small right pleural  effusion with associated atelectasis which is new. The minimal calcification over the left anterior descending and lateral circumflex arteries.  Abdominal images  demonstrate a normal liver, spleen, pancreas, gallbladder and adrenal glands. Left kidney is normal in size with a few small stones the largest over the upper pole measuring 5 mm. The left ureter is normal. The right kidney is normal in size with a couple small 1-2 mm nonobstructing stones. There is mild right-sided hydronephrosis. There is a 7.4 x 9.6 cm predominately simple appearing fluid collection adjacent to an immediately below the inferior pole of the right kidney. There is stranding of the right perinephric fat. The proximal right ureter courses through some of this fluid collection as the ureter is otherwise within normal. The appendix is normal. No evidence of bowel obstruction.  There postsurgical changes over the umbilical region. Pelvic images demonstrate surgical absence of the prostate. Small left inguinal hernia containing only mesenteric fat. Remainder of the exam is unchanged.  IMPRESSION: 7.4 x 9.6 cm predominately simple appearing fluid collection adjacent to and inferior to the lower pole of the right kidney. Patient underwent recent partial right nephrectomy of lower pole exophytic mass 02/15/2013 as this postoperative fluid collection may be infectious versus noninfectious in nature. Proximal right ureter courses through the anterior medial aspect of this fluid collection as there is mild dilatation of the right intrarenal collecting system.  Bilateral nephrolithiasis.  Small moderate pleural fluid with associated atelectasis.  Prior prostatectomy.   Electronically Signed   By: Marin Olp M.D.   On: 03/03/2013 13:11   Dg Chest 2 View  03/03/2013   CLINICAL DATA:  Fever.  History of hypertension and prostate cancer.  EXAM: CHEST  2 VIEW  COMPARISON:  02/12/2013 and 03/10/2009.  FINDINGS: There are lower lung volumes with mildly increased linear atelectasis at both lung bases. No airspace disease, edema or pleural effusion is identified. The heart size and mediastinal contours are stable  for the degree of inspiration. Thoracic kyphosis and spondylosis appear unchanged.  IMPRESSION: Lower lung volumes with mildly increased linear bibasilar atelectasis. No evidence of focal airspace disease.   Electronically Signed   By: Camie Patience M.D.   On: 03/03/2013 09:52   Ir Guided Niel Hummer W Catheter Placement  03/04/2013   CLINICAL DATA:  Partial right nephrectomy with urinoma  EXAM: ABSCESS DRAINAGE; PERC NEPHROSTOMY*R*; IR RIGHT PERC INTRO URET CATH; RIGHT NEPHROSTOGRAM; IR ULTRASOUND GUIDANCE TISSUE ABLATION  MEDICATIONS AND MEDICAL HISTORY: Versed 6 mg, Fentanyl 200 mcg.  Additional Medications: Rocephin 1 g.  ANESTHESIA/SEDATION: Moderate sedation time: 50 minutes  CONTRAST:  20 cc Omnipaque 300  FLUOROSCOPY TIME:  12 min and 42 seconds.  PROCEDURE: The procedure, risks, benefits, and alternatives were explained to the patient. Questions regarding the procedure were encouraged and answered. The patient understands and consents to the procedure.  The back was prepped with Betadine in a sterile fashion, and a sterile drape was applied covering the operative field. A sterile gown and sterile gloves were used for the procedure.  Under sonographic guidance, a 22 gauge Chiba needle was inserted into the urinoma. Yellowish clear fluid was aspirated. The needle was removed over a 018 wire which was up sized to a 3 J. a 10 [place] dilator followed by a 10 [place] drain were inserted. This was looped and string fixed and sewn to the skin.  A Chiba needle was then inserted into the renal pelvis under fluoroscopic guidance. Contrast and air were injected after aspirating urine. A  21 gauge needle was then advanced into a gas-filled posterior interpolar region calyx over the 12th rib and removed over a 018 wire which was up sized to a Bentson. A copy catheter was advanced over the Bentson wire to the proximal ureter and contrast was injected demonstrating the ureteral leak from the proximal ureter into the urinoma.  The  Kumpe the catheter was carefully advanced over a glidewire across the ureteral defect, into the distal ureter, and finally into the bladder. Contrast was injected opacifying the bladder. The wire was removed over the 3 J.  A 26 cm 8.5 French double-J ureteral stent was advanced over the wire. It was coiled in the bladder and renal pelvis. The string was removed without complication.  A nephrostomy was then advanced over the 3 J and coiled in the renal pelvis. It was looped and string fixed. It was sewn to the skin.  COMPLICATIONS: None  FINDINGS: Imaging demonstrates 20 French drain placement into the urinoma.  Subsequent imaging demonstrates opacifying the renal collecting system with contrast and gas via the renal pelvis.  Next series of images demonstrate access into and interpolar region posterior gas-filled interpolar region calyx.  The next image demonstrates a nephrostogram via a copy catheter in the proximal ureter. This does demonstrate the ureteral leak into the urinoma has while as ureteral obstruction just beyond the ureteral defect. The defect occurs approximately 10 cm beyond the ureteropelvic junction.  The next series of images demonstrate placement of a double-J ureteral stent extending from the right renal pelvis to the bladder as well as placement of a 10 French nephrostomy coiled in the renal pelvis.  IMPRESSION: Successful percutaneous nephrostomy catheter placement and double-J ureteral stent placement for a ureteral leak occurring 10 cm beyond the UP junction. A urinoma drain was also placed. Once the urinoma drain ceases, it can be removed. Nephrostomy catheter can also be removed at that time.   Electronically Signed   By: Maryclare Bean M.D.   On: 03/04/2013 12:35   Ir Nephrostogram Right  03/04/2013   CLINICAL DATA:  Partial right nephrectomy with urinoma  EXAM: ABSCESS DRAINAGE; PERC NEPHROSTOMY*R*; IR RIGHT PERC INTRO URET CATH; RIGHT NEPHROSTOGRAM; IR ULTRASOUND GUIDANCE TISSUE ABLATION   MEDICATIONS AND MEDICAL HISTORY: Versed 6 mg, Fentanyl 200 mcg.  Additional Medications: Rocephin 1 g.  ANESTHESIA/SEDATION: Moderate sedation time: 50 minutes  CONTRAST:  20 cc Omnipaque 300  FLUOROSCOPY TIME:  12 min and 42 seconds.  PROCEDURE: The procedure, risks, benefits, and alternatives were explained to the patient. Questions regarding the procedure were encouraged and answered. The patient understands and consents to the procedure.  The back was prepped with Betadine in a sterile fashion, and a sterile drape was applied covering the operative field. A sterile gown and sterile gloves were used for the procedure.  Under sonographic guidance, a 22 gauge Chiba needle was inserted into the urinoma. Yellowish clear fluid was aspirated. The needle was removed over a 018 wire which was up sized to a 3 J. a 10 [place] dilator followed by a 10 [place] drain were inserted. This was looped and string fixed and sewn to the skin.  A Chiba needle was then inserted into the renal pelvis under fluoroscopic guidance. Contrast and air were injected after aspirating urine. A 21 gauge needle was then advanced into a gas-filled posterior interpolar region calyx over the 12th rib and removed over a 018 wire which was up sized to a Bentson. A copy catheter was advanced over the  Bentson wire to the proximal ureter and contrast was injected demonstrating the ureteral leak from the proximal ureter into the urinoma.  The Kumpe the catheter was carefully advanced over a glidewire across the ureteral defect, into the distal ureter, and finally into the bladder. Contrast was injected opacifying the bladder. The wire was removed over the 3 J.  A 26 cm 8.5 French double-J ureteral stent was advanced over the wire. It was coiled in the bladder and renal pelvis. The string was removed without complication.  A nephrostomy was then advanced over the 3 J and coiled in the renal pelvis. It was looped and string fixed. It was sewn to the skin.   COMPLICATIONS: None  FINDINGS: Imaging demonstrates 22 French drain placement into the urinoma.  Subsequent imaging demonstrates opacifying the renal collecting system with contrast and gas via the renal pelvis.  Next series of images demonstrate access into and interpolar region posterior gas-filled interpolar region calyx.  The next image demonstrates a nephrostogram via a copy catheter in the proximal ureter. This does demonstrate the ureteral leak into the urinoma has while as ureteral obstruction just beyond the ureteral defect. The defect occurs approximately 10 cm beyond the ureteropelvic junction.  The next series of images demonstrate placement of a double-J ureteral stent extending from the right renal pelvis to the bladder as well as placement of a 10 French nephrostomy coiled in the renal pelvis.  IMPRESSION: Successful percutaneous nephrostomy catheter placement and double-J ureteral stent placement for a ureteral leak occurring 10 cm beyond the UP junction. A urinoma drain was also placed. Once the urinoma drain ceases, it can be removed. Nephrostomy catheter can also be removed at that time.   Electronically Signed   By: Maryclare Bean M.D.   On: 03/04/2013 12:35   Ir Perc Nephrostomy Right  03/04/2013   CLINICAL DATA:  Partial right nephrectomy with urinoma  EXAM: ABSCESS DRAINAGE; PERC NEPHROSTOMY*R*; IR RIGHT PERC INTRO URET CATH; RIGHT NEPHROSTOGRAM; IR ULTRASOUND GUIDANCE TISSUE ABLATION  MEDICATIONS AND MEDICAL HISTORY: Versed 6 mg, Fentanyl 200 mcg.  Additional Medications: Rocephin 1 g.  ANESTHESIA/SEDATION: Moderate sedation time: 50 minutes  CONTRAST:  20 cc Omnipaque 300  FLUOROSCOPY TIME:  12 min and 42 seconds.  PROCEDURE: The procedure, risks, benefits, and alternatives were explained to the patient. Questions regarding the procedure were encouraged and answered. The patient understands and consents to the procedure.  The back was prepped with Betadine in a sterile fashion, and a sterile  drape was applied covering the operative field. A sterile gown and sterile gloves were used for the procedure.  Under sonographic guidance, a 22 gauge Chiba needle was inserted into the urinoma. Yellowish clear fluid was aspirated. The needle was removed over a 018 wire which was up sized to a 3 J. a 10 [place] dilator followed by a 10 [place] drain were inserted. This was looped and string fixed and sewn to the skin.  A Chiba needle was then inserted into the renal pelvis under fluoroscopic guidance. Contrast and air were injected after aspirating urine. A 21 gauge needle was then advanced into a gas-filled posterior interpolar region calyx over the 12th rib and removed over a 018 wire which was up sized to a Bentson. A copy catheter was advanced over the Bentson wire to the proximal ureter and contrast was injected demonstrating the ureteral leak from the proximal ureter into the urinoma.  The Kumpe the catheter was carefully advanced over a glidewire across the ureteral defect,  into the distal ureter, and finally into the bladder. Contrast was injected opacifying the bladder. The wire was removed over the 3 J.  A 26 cm 8.5 French double-J ureteral stent was advanced over the wire. It was coiled in the bladder and renal pelvis. The string was removed without complication.  A nephrostomy was then advanced over the 3 J and coiled in the renal pelvis. It was looped and string fixed. It was sewn to the skin.  COMPLICATIONS: None  FINDINGS: Imaging demonstrates 16 French drain placement into the urinoma.  Subsequent imaging demonstrates opacifying the renal collecting system with contrast and gas via the renal pelvis.  Next series of images demonstrate access into and interpolar region posterior gas-filled interpolar region calyx.  The next image demonstrates a nephrostogram via a copy catheter in the proximal ureter. This does demonstrate the ureteral leak into the urinoma has while as ureteral obstruction just beyond  the ureteral defect. The defect occurs approximately 10 cm beyond the ureteropelvic junction.  The next series of images demonstrate placement of a double-J ureteral stent extending from the right renal pelvis to the bladder as well as placement of a 10 French nephrostomy coiled in the renal pelvis.  IMPRESSION: Successful percutaneous nephrostomy catheter placement and double-J ureteral stent placement for a ureteral leak occurring 10 cm beyond the UP junction. A urinoma drain was also placed. Once the urinoma drain ceases, it can be removed. Nephrostomy catheter can also be removed at that time.   Electronically Signed   By: Maryclare Bean M.D.   On: 03/04/2013 12:35   Ir US Guide Bx Asp/drain  03/04/2013   CLINICAL DATA:  Partial right nephrectomy with urinoma  EXAM: ABSCESS DRAINAGE; PERC NEPHROSTOMY*R*; IR RIGHT PERC INTRO URET CATH; RIGHT NEPHROSTOGRAM; IR ULTRASOUND GUIDANCE TISSUE ABLATION  MEDICATIONS AND MEDICAL HISTORY: Versed 6 mg, Fentanyl 200 mcg.  Additional Medications: Rocephin 1 g.  ANESTHESIA/SEDATION: Moderate sedation time: 50 minutes  CONTRAST:  20 cc Omnipaque 300  FLUOROSCOPY TIME:  12 min and 42 seconds.  PROCEDURE: The procedure, risks, benefits, and alternatives were explained to the patient. Questions regarding the procedure were encouraged and answered. The patient understands and consents to the procedure.  The back was prepped with Betadine in a sterile fashion, and a sterile drape was applied covering the operative field. A sterile gown and sterile gloves were used for the procedure.  Under sonographic guidance, a 22 gauge Chiba needle was inserted into the urinoma. Yellowish clear fluid was aspirated. The needle was removed over a 018 wire which was up sized to a 3 J. a 10 [place] dilator followed by a 10 [place] drain were inserted. This was looped and string fixed and sewn to the skin.  A Chiba needle was then inserted into the renal pelvis under fluoroscopic guidance. Contrast and air  were injected after aspirating urine. A 21 gauge needle was then advanced into a gas-filled posterior interpolar region calyx over the 12th rib and removed over a 018 wire which was up sized to a Bentson. A copy catheter was advanced over the Bentson wire to the proximal ureter and contrast was injected demonstrating the ureteral leak from the proximal ureter into the urinoma.  The Kumpe the catheter was carefully advanced over a glidewire across the ureteral defect, into the distal ureter, and finally into the bladder. Contrast was injected opacifying the bladder. The wire was removed over the 3 J.  A 26 cm 8.5 French double-J ureteral stent was advanced over  the wire. It was coiled in the bladder and renal pelvis. The string was removed without complication.  A nephrostomy was then advanced over the 3 J and coiled in the renal pelvis. It was looped and string fixed. It was sewn to the skin.  COMPLICATIONS: None  FINDINGS: Imaging demonstrates 46 French drain placement into the urinoma.  Subsequent imaging demonstrates opacifying the renal collecting system with contrast and gas via the renal pelvis.  Next series of images demonstrate access into and interpolar region posterior gas-filled interpolar region calyx.  The next image demonstrates a nephrostogram via a copy catheter in the proximal ureter. This does demonstrate the ureteral leak into the urinoma has while as ureteral obstruction just beyond the ureteral defect. The defect occurs approximately 10 cm beyond the ureteropelvic junction.  The next series of images demonstrate placement of a double-J ureteral stent extending from the right renal pelvis to the bladder as well as placement of a 10 French nephrostomy coiled in the renal pelvis.  IMPRESSION: Successful percutaneous nephrostomy catheter placement and double-J ureteral stent placement for a ureteral leak occurring 10 cm beyond the UP junction. A urinoma drain was also placed. Once the urinoma drain  ceases, it can be removed. Nephrostomy catheter can also be removed at that time.   Electronically Signed   By: Maryclare Bean M.D.   On: 03/04/2013 12:35   Ir US Guide Bx Asp/drain  03/04/2013   CLINICAL DATA:  Partial right nephrectomy with urinoma  EXAM: ABSCESS DRAINAGE; PERC NEPHROSTOMY*R*; IR RIGHT PERC INTRO URET CATH; RIGHT NEPHROSTOGRAM; IR ULTRASOUND GUIDANCE TISSUE ABLATION  MEDICATIONS AND MEDICAL HISTORY: Versed 6 mg, Fentanyl 200 mcg.  Additional Medications: Rocephin 1 g.  ANESTHESIA/SEDATION: Moderate sedation time: 50 minutes  CONTRAST:  20 cc Omnipaque 300  FLUOROSCOPY TIME:  12 min and 42 seconds.  PROCEDURE: The procedure, risks, benefits, and alternatives were explained to the patient. Questions regarding the procedure were encouraged and answered. The patient understands and consents to the procedure.  The back was prepped with Betadine in a sterile fashion, and a sterile drape was applied covering the operative field. A sterile gown and sterile gloves were used for the procedure.  Under sonographic guidance, a 22 gauge Chiba needle was inserted into the urinoma. Yellowish clear fluid was aspirated. The needle was removed over a 018 wire which was up sized to a 3 J. a 10 [place] dilator followed by a 10 [place] drain were inserted. This was looped and string fixed and sewn to the skin.  A Chiba needle was then inserted into the renal pelvis under fluoroscopic guidance. Contrast and air were injected after aspirating urine. A 21 gauge needle was then advanced into a gas-filled posterior interpolar region calyx over the 12th rib and removed over a 018 wire which was up sized to a Bentson. A copy catheter was advanced over the Bentson wire to the proximal ureter and contrast was injected demonstrating the ureteral leak from the proximal ureter into the urinoma.  The Kumpe the catheter was carefully advanced over a glidewire across the ureteral defect, into the distal ureter, and finally into the  bladder. Contrast was injected opacifying the bladder. The wire was removed over the 3 J.  A 26 cm 8.5 French double-J ureteral stent was advanced over the wire. It was coiled in the bladder and renal pelvis. The string was removed without complication.  A nephrostomy was then advanced over the 3 J and coiled in the renal pelvis. It was  looped and string fixed. It was sewn to the skin.  COMPLICATIONS: None  FINDINGS: Imaging demonstrates 76 French drain placement into the urinoma.  Subsequent imaging demonstrates opacifying the renal collecting system with contrast and gas via the renal pelvis.  Next series of images demonstrate access into and interpolar region posterior gas-filled interpolar region calyx.  The next image demonstrates a nephrostogram via a copy catheter in the proximal ureter. This does demonstrate the ureteral leak into the urinoma has while as ureteral obstruction just beyond the ureteral defect. The defect occurs approximately 10 cm beyond the ureteropelvic junction.  The next series of images demonstrate placement of a double-J ureteral stent extending from the right renal pelvis to the bladder as well as placement of a 10 French nephrostomy coiled in the renal pelvis.  IMPRESSION: Successful percutaneous nephrostomy catheter placement and double-J ureteral stent placement for a ureteral leak occurring 10 cm beyond the UP junction. A urinoma drain was also placed. Once the urinoma drain ceases, it can be removed. Nephrostomy catheter can also be removed at that time.   Electronically Signed   By: Maryclare Bean M.D.   On: 03/04/2013 12:35   Ir Oris Drone Cath Perc Right  03/04/2013   CLINICAL DATA:  Partial right nephrectomy with urinoma  EXAM: ABSCESS DRAINAGE; PERC NEPHROSTOMY*R*; IR RIGHT PERC INTRO URET CATH; RIGHT NEPHROSTOGRAM; IR ULTRASOUND GUIDANCE TISSUE ABLATION  MEDICATIONS AND MEDICAL HISTORY: Versed 6 mg, Fentanyl 200 mcg.  Additional Medications: Rocephin 1 g.  ANESTHESIA/SEDATION:  Moderate sedation time: 50 minutes  CONTRAST:  20 cc Omnipaque 300  FLUOROSCOPY TIME:  12 min and 42 seconds.  PROCEDURE: The procedure, risks, benefits, and alternatives were explained to the patient. Questions regarding the procedure were encouraged and answered. The patient understands and consents to the procedure.  The back was prepped with Betadine in a sterile fashion, and a sterile drape was applied covering the operative field. A sterile gown and sterile gloves were used for the procedure.  Under sonographic guidance, a 22 gauge Chiba needle was inserted into the urinoma. Yellowish clear fluid was aspirated. The needle was removed over a 018 wire which was up sized to a 3 J. a 10 [place] dilator followed by a 10 [place] drain were inserted. This was looped and string fixed and sewn to the skin.  A Chiba needle was then inserted into the renal pelvis under fluoroscopic guidance. Contrast and air were injected after aspirating urine. A 21 gauge needle was then advanced into a gas-filled posterior interpolar region calyx over the 12th rib and removed over a 018 wire which was up sized to a Bentson. A copy catheter was advanced over the Bentson wire to the proximal ureter and contrast was injected demonstrating the ureteral leak from the proximal ureter into the urinoma.  The Kum[place] the catheter was carefully advanced over a glidewire across the ureteral defect, into the distal ureter, and finally into the bladder. Contrast was injected opacifying the bladder. The wire was removed over the 3 J.  A 26 cm 8.5 French double-J ureteral stent was advanced over the wire. It was coiled in the bladder and renal pelvis. The string was removed without complication.  A nephrostomy was then advanced over the 3 J and coiled in the renal pelvis. It was looped and string fixed. It was sewn to the skin.  COMPLICATIONS: None  FINDINGS: Imaging demonstrates 3 French drain placement into the urinoma.  Subsequent imaging  demonstrates opacifying the renal collecting system with  contrast and gas via the renal pelvis.  Next series of images demonstrate access into and interpolar region posterior gas-filled interpolar region calyx.  The next image demonstrates a nephrostogram via a copy catheter in the proximal ureter. This does demonstrate the ureteral leak into the urinoma has while as ureteral obstruction just beyond the ureteral defect. The defect occurs approximately 10 cm beyond the ureteropelvic junction.  The next series of images demonstrate placement of a double-J ureteral stent extending from the right renal pelvis to the bladder as well as placement of a 10 French nephrostomy coiled in the renal pelvis.  IMPRESSION: Successful percutaneous nephrostomy catheter placement and double-J ureteral stent placement for a ureteral leak occurring 10 cm beyond the UP junction. A urinoma drain was also placed. Once the urinoma drain ceases, it can be removed. Nephrostomy catheter can also be removed at that time.   Electronically Signed   By: Maryclare Bean M.D.   On: 03/04/2013 12:35    Anti-infectives: Anti-infectives   Start     Dose/Rate Route Frequency Ordered Stop   03/04/13 1000  cefTRIAXone (ROCEPHIN) 1 g in dextrose 5 % 50 mL IVPB     1 g 100 mL/hr over 30 Minutes Intravenous Every 24 hours 03/03/13 1436     03/03/13 1030  cefTRIAXone (ROCEPHIN) 1 g in dextrose 5 % 50 mL IVPB     1 g 100 mL/hr over 30 Minutes Intravenous  Once 03/03/13 1020 03/03/13 1138      Assessment/Plan:  1- Moderate Risk Prostate Cancer - first PSA to be drawn around 03/2013.   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   3 - Rt Retroperitoneal Urinoma / Ureteral Leak - Unusual case. Aparrant delayed presentation of urinoma from leak from mid ureter, away from area of prior rt rhenorraphy. Suspect prior sub-acute ureteral stretch / torque injury from mobilization at recent procedure. Will plan for maximal drainage as  per current drains. Once urinoma output minimal, remov that drain, then leave JJ stent / nephrostomy for several weeks and restudy antegrade v. Retrograde. If does not heal with trial of stenting, he may need some sort of reconstruction.   [facility], David [name] 03/04/2013

## 2013-03-05 ENCOUNTER — Encounter (HOSPITAL_COMMUNITY): Payer: Self-pay | Admitting: Internal Medicine

## 2013-03-05 DIAGNOSIS — IMO0002 Reserved for concepts with insufficient information to code with codable children: Secondary | ICD-10-CM

## 2013-03-05 DIAGNOSIS — N12 Tubulo-interstitial nephritis, not specified as acute or chronic: Secondary | ICD-10-CM

## 2013-03-05 DIAGNOSIS — N151 Renal and perinephric abscess: Secondary | ICD-10-CM | POA: Diagnosis present

## 2013-03-05 DIAGNOSIS — N179 Acute kidney failure, unspecified: Secondary | ICD-10-CM | POA: Diagnosis present

## 2013-03-05 HISTORY — DX: Reserved for concepts with insufficient information to code with codable children: IMO0002

## 2013-03-05 HISTORY — DX: Tubulo-interstitial nephritis, not specified as acute or chronic: N12

## 2013-03-05 LAB — BASIC METABOLIC PANEL
BUN: 18 mg/dL (ref 6–23)
CALCIUM: 9.9 mg/dL (ref 8.4–10.5)
CO2: 21 mEq/L (ref 19–32)
CREATININE: 1.11 mg/dL (ref 0.50–1.35)
Chloride: 106 mEq/L (ref 96–112)
GFR, EST AFRICAN AMERICAN: 75 mL/min — AB (ref >90)
GFR, EST NON AFRICAN AMERICAN: 65 mL/min — AB (ref >90)
Glucose, Bld: 98 mg/dL (ref 70–99)
Potassium: 4.1 mEq/L (ref 3.7–5.3)
Sodium: 139 mEq/L (ref 137–147)

## 2013-03-05 LAB — CBC WITH DIFFERENTIAL/PLATELET
BASOS ABS: 0 10*3/uL (ref 0.0–0.1)
BASOS PCT: 1 % (ref 0–1)
EOS ABS: 0.7 10*3/uL (ref 0.0–0.7)
EOS PCT: 8 % — AB (ref 0–5)
HEMATOCRIT: 29.6 % — AB (ref 39.0–52.0)
Hemoglobin: 9.9 g/dL — ABNORMAL LOW (ref 13.0–17.0)
Lymphocytes Relative: 22 % (ref 12–46)
Lymphs Abs: 1.7 10*3/uL (ref 0.7–4.0)
MCH: 30.7 pg (ref 26.0–34.0)
MCHC: 33.4 g/dL (ref 30.0–36.0)
MCV: 91.9 fL (ref 78.0–100.0)
MONO ABS: 0.8 10*3/uL (ref 0.1–1.0)
MONOS PCT: 10 % (ref 3–12)
Neutro Abs: 4.6 10*3/uL (ref 1.7–7.7)
Neutrophils Relative %: 59 % (ref 43–77)
Platelets: 273 10*3/uL (ref 150–400)
RBC: 3.22 MIL/uL — ABNORMAL LOW (ref 4.22–5.81)
RDW: 13.5 % (ref 11.5–15.5)
WBC: 7.8 10*3/uL (ref 4.0–10.5)

## 2013-03-05 LAB — URINE CULTURE
COLONY COUNT: NO GROWTH
Culture: NO GROWTH

## 2013-03-05 NOTE — Progress Notes (Signed)
[facility] PROGRESS NOTE   David [name] [id number] DOB: 02/19/1941 DOA: 03/03/2013 PCP: Drema Pry, DO  Brief narrative: David [name] is an 73 y.o. male with a PMH of prostate cancer status post radical prostatectomy 12/14 with negative margins and right renal mass status post right probiotic partial nephrectomy 12/14 with pathology confirming a benign oncocytoma who was admitted on 03/03/13 with a chief complaint of fevers and weakness, found to have a right retroperitoneal urinoma and acute renal failure with a perinephric abscess of right kidney.  Assessment/Plan: Principal Problem:   Pyelonephritis/perinephric abscess CT scan done on 03/03/2012 showed 7.4 x 9.6 cm predominately simple appearing fluid collection adjacent to and inferior to the lower pole of the right kidney.  Continue empiric antibiotics with Rocephin. Urine cultures from 03/03/13 negative but culture from abscess drainage still pending.  Can likely be discharged home when this culture has been reviewed once back. Active Problems:   HYPERLIPIDEMIA Continue Zocor.   HYPERTENSION Continue Azor.   Prostate cancer Status post radical prostatectomy 01/2013 for [id number] Gleason 3+4=7 adenocarcinoma, margins negative. Was previously on surveillance for low-risk disease that had progressed by protocol-biopsy. PSA level to be checked in February.   Renal oncocytoma of right kidney Status post right robotic partial nephrectomy 01/2013 for 3.5cm lower pole enhancing mass, path confirmed benign oncocytoma, with no further evaluation recommended by urologist.     Right retroperitoneal urinoma / ureteral leak / acute renals failure Pt admitted 03/03/13 and found to have right retroperitoneal urinoma and elevated Cr worrisome for urine leak from recent partial nephrectomy. He had minimal drain output and normal Cr when discharged postop. IR placed urinoma drain 03/04/13 and also rt nephrostomy + JJ ureteral stent, found to have  extravasation from mid ureter (not at level of prior renorrhaphy). Drain currently functioning well with normalization of creatinine.  Code Status: Full. Family Communication: No family at bedside. Disposition Plan: Home when stable.   IV access:  Peripheral IV  Medical Consultants:  Dr. [name] [facility]  Dr. Jacqulynn Cadet, IR  Other Consultants:  None.  Anti-infectives:  Rocephin 03/03/48--->  HPI/Subjective: David [name] is still a bit weak, appetite fair.  No complaints of pain, nausea or vomiting.  No dyspnea or cough.  Objective: Filed Vitals:   03/04/13 1430 03/04/13 1518 03/04/13 2054 03/05/13 0426  BP: 101/52 110/52 121/65 120/62  Pulse: 86 86 88 77  Temp: 98.5 F (36.9 C) 98.9 F (37.2 C) 99 F (37.2 C) 98.2 F (36.8 C)  TempSrc: Oral Oral Oral Oral  Resp: 20 20 19 18   Height:      Weight:    94.3 kg (207 lb 14.3 oz)  SpO2: 96% 96% 95% 95%    Intake/Output Summary (Last 24 hours) at 03/05/13 0819 Last data filed at 03/05/13 [id number]  Gross per 24 hour  Intake 1743.75 ml  Output   2090 ml  Net -346.25 ml    Exam: Gen:  NAD Cardiovascular:  RRR, No M/R/G Respiratory:  Lungs CTAB Gastrointestinal:  Abdomen soft, NT/ND, + BS Extremities:  No C/E/C  Data Reviewed: Basic Metabolic Panel:  Recent Labs Lab 03/03/13 0910 03/04/13 0515 03/05/13 0500  NA 138 138 139  K 4.5 4.4 4.1  CL 104 105 106  CO2 20 20 21   GLUCOSE 122* 99 98  BUN 25* 24* 18  CREATININE 2.00* 1.91* 1.11  CALCIUM 10.5 9.9 9.9   GFR Estimated Creatinine Clearance: 72.8 ml/min (by C-G formula based on Cr of  1.11).  Coagulation profile  Recent Labs Lab 03/03/13 1512  INR 1.26    CBC:  Recent Labs Lab 03/03/13 0910 03/04/13 0515 03/05/13 0500  WBC 13.5* 11.8* 7.8  NEUTROABS 10.6*  --  4.6  HGB 11.6* 11.0* 9.9*  HCT 34.0* 33.2* 29.6*  MCV 91.4 92.2 91.9  PLT 271 248 273   Microbiology Recent Results (from the past 240 hour(s))  URINE CULTURE      Status: None   Collection Time    03/03/13 10:00 AM      Result Value Range Status   Specimen Description URINE, CLEAN CATCH   Final   Special Requests NONE   Final   Culture  Setup Time     Final   Value: 03/03/2013 16:22     Performed at [facility] Count     Final   Value: NO GROWTH     Performed at [facility]   Culture     Final   Value: NO GROWTH     Performed at [facility]   Report Status 03/04/2013 FINAL   Final     Procedures and Diagnostic Studies: Ct Abdomen Pelvis Wo Contrast  03/03/2013   CLINICAL DATA:  Fever in weakness. Prior prostatectomy for prostate cancer. Partial right nephrectomy/lymphadenectomy 02/15/2013.  EXAM: CT ABDOMEN AND PELVIS WITHOUT CONTRAST  TECHNIQUE: Multidetector CT imaging of the abdomen and pelvis was performed following the standard protocol without intravenous contrast.  COMPARISON:  01/18/2013 and 04/07/2005  FINDINGS: Lung bases demonstrate a small right pleural effusion with associated atelectasis which is new. The minimal calcification over the left anterior descending and lateral circumflex arteries.  Abdominal images demonstrate a normal liver, spleen, pancreas, gallbladder and adrenal glands. Left kidney is normal in size with a few small stones the largest over the upper pole measuring 5 mm. The left ureter is normal. The right kidney is normal in size with a couple small 1-2 mm nonobstructing stones. There is mild right-sided hydronephrosis. There is a 7.4 x 9.6 cm predominately simple appearing fluid collection adjacent to an immediately below the inferior pole of the right kidney. There is stranding of the right perinephric fat. The proximal right ureter courses through some of this fluid collection as the ureter is otherwise within normal. The appendix is normal. No evidence of bowel obstruction.  There postsurgical changes over the umbilical region. Pelvic images demonstrate surgical absence of the  prostate. Small left inguinal hernia containing only mesenteric fat. Remainder of the exam is unchanged.  IMPRESSION: 7.4 x 9.6 cm predominately simple appearing fluid collection adjacent to and inferior to the lower pole of the right kidney. Patient underwent recent partial right nephrectomy of lower pole exophytic mass 02/15/2013 as this postoperative fluid collection may be infectious versus noninfectious in nature. Proximal right ureter courses through the anterior medial aspect of this fluid collection as there is mild dilatation of the right intrarenal collecting system.  Bilateral nephrolithiasis.  Small moderate pleural fluid with associated atelectasis.  Prior prostatectomy.   Electronically Signed   By: Marin Olp M.D.   On: 03/03/2013 13:11   Dg Chest 2 View  03/03/2013   CLINICAL DATA:  Fever.  History of hypertension and prostate cancer.  EXAM: CHEST  2 VIEW  COMPARISON:  02/12/2013 and 03/10/2009.  FINDINGS: There are lower lung volumes with mildly increased linear atelectasis at both lung bases. No airspace disease, edema or pleural effusion is identified. The heart size and mediastinal  contours are stable for the degree of inspiration. Thoracic kyphosis and spondylosis appear unchanged.  IMPRESSION: Lower lung volumes with mildly increased linear bibasilar atelectasis. No evidence of focal airspace disease.   Electronically Signed   By: Camie Patience M.D.   On: 03/03/2013 09:52   Dg Chest 2 View  02/12/2013   CLINICAL DATA:  Preop for prostatectomy.  Hypertension.  EXAM: CHEST  2 VIEW  COMPARISON:  03/10/2009  FINDINGS: Cardiac silhouette is normal in size. Mediastinum is normal in contour. No hilar masses.  Clear lungs.  No pleural effusion or pneumothorax.  Bones are diffusely demineralized. Old rib fractures noted on the right. No change from the prior study.  IMPRESSION: No active cardiopulmonary disease.   Electronically Signed   By: Lajean Manes M.D.   On: 02/12/2013 16:56   Dg Abd 1  View  02/18/2013   CLINICAL DATA:  Lower back pain, severe abdominal distention.  EXAM: ABDOMEN - 1 VIEW  COMPARISON:  August 17, 2011.  FINDINGS: Mildly dilated small bowel loops are noted concerning for distal small bowel obstruction or ileus. Stool is noted in the colon. Air is noted in the rectum. There is noted catheter projected over the pelvis which may represent an urinary bladder or rectal catheter. Clinical correlation is recommended.  IMPRESSION: Mildly dilated small bowel loops are noted which may represent ileus or possibly distal small bowel obstruction. Followup radiographs are recommended.   Electronically Signed   By: Sabino Dick M.D.   On: 02/18/2013 09:28   Ir Guided Niel Hummer W Catheter Placement  03/04/2013   CLINICAL DATA:  Partial right nephrectomy with urinoma  EXAM: ABSCESS DRAINAGE; PERC NEPHROSTOMY*R*; IR RIGHT PERC INTRO URET CATH; RIGHT NEPHROSTOGRAM; IR ULTRASOUND GUIDANCE TISSUE ABLATION  MEDICATIONS AND MEDICAL HISTORY: Versed 6 mg, Fentanyl 200 mcg.  Additional Medications: Rocephin 1 g.  ANESTHESIA/SEDATION: Moderate sedation time: 50 minutes  CONTRAST:  20 cc Omnipaque 300  FLUOROSCOPY TIME:  12 min and 42 seconds.  PROCEDURE: The procedure, risks, benefits, and alternatives were explained to the patient. Questions regarding the procedure were encouraged and answered. The patient understands and consents to the procedure.  The back was prepped with Betadine in a sterile fashion, and a sterile drape was applied covering the operative field. A sterile gown and sterile gloves were used for the procedure.  Under sonographic guidance, a 22 gauge Chiba needle was inserted into the urinoma. Yellowish clear fluid was aspirated. The needle was removed over a 018 wire which was up sized to a 3 J. a 10 [place] dilator followed by a 10 [place] drain were inserted. This was looped and string fixed and sewn to the skin.  A Chiba needle was then inserted into the renal pelvis under fluoroscopic  guidance. Contrast and air were injected after aspirating urine. A 21 gauge needle was then advanced into a gas-filled posterior interpolar region calyx over the 12th rib and removed over a 018 wire which was up sized to a Bentson. A copy catheter was advanced over the Bentson wire to the proximal ureter and contrast was injected demonstrating the ureteral leak from the proximal ureter into the urinoma.  The Kumpe the catheter was carefully advanced over a glidewire across the ureteral defect, into the distal ureter, and finally into the bladder. Contrast was injected opacifying the bladder. The wire was removed over the 3 J.  A 26 cm 8.5 French double-J ureteral stent was advanced over the wire. It was coiled in the bladder and  renal pelvis. The string was removed without complication.  A nephrostomy was then advanced over the 3 J and coiled in the renal pelvis. It was looped and string fixed. It was sewn to the skin.  COMPLICATIONS: None  FINDINGS: Imaging demonstrates 76 French drain placement into the urinoma.  Subsequent imaging demonstrates opacifying the renal collecting system with contrast and gas via the renal pelvis.  Next series of images demonstrate access into and interpolar region posterior gas-filled interpolar region calyx.  The next image demonstrates a nephrostogram via a copy catheter in the proximal ureter. This does demonstrate the ureteral leak into the urinoma has while as ureteral obstruction just beyond the ureteral defect. The defect occurs approximately 10 cm beyond the ureteropelvic junction.  The next series of images demonstrate placement of a double-J ureteral stent extending from the right renal pelvis to the bladder as well as placement of a 10 French nephrostomy coiled in the renal pelvis.  IMPRESSION: Successful percutaneous nephrostomy catheter placement and double-J ureteral stent placement for a ureteral leak occurring 10 cm beyond the UP junction. A urinoma drain was also  placed. Once the urinoma drain ceases, it can be removed. Nephrostomy catheter can also be removed at that time.   Electronically Signed   By: Maryclare Bean M.D.   On: 03/04/2013 12:35   Ir Nephrostogram Right  03/04/2013   CLINICAL DATA:  Partial right nephrectomy with urinoma  EXAM: ABSCESS DRAINAGE; PERC NEPHROSTOMY*R*; IR RIGHT PERC INTRO URET CATH; RIGHT NEPHROSTOGRAM; IR ULTRASOUND GUIDANCE TISSUE ABLATION  MEDICATIONS AND MEDICAL HISTORY: Versed 6 mg, Fentanyl 200 mcg.  Additional Medications: Rocephin 1 g.  ANESTHESIA/SEDATION: Moderate sedation time: 50 minutes  CONTRAST:  20 cc Omnipaque 300  FLUOROSCOPY TIME:  12 min and 42 seconds.  PROCEDURE: The procedure, risks, benefits, and alternatives were explained to the patient. Questions regarding the procedure were encouraged and answered. The patient understands and consents to the procedure.  The back was prepped with Betadine in a sterile fashion, and a sterile drape was applied covering the operative field. A sterile gown and sterile gloves were used for the procedure.  Under sonographic guidance, a 22 gauge Chiba needle was inserted into the urinoma. Yellowish clear fluid was aspirated. The needle was removed over a 018 wire which was up sized to a 3 J. a 10 [place] dilator followed by a 10 [place] drain were inserted. This was looped and string fixed and sewn to the skin.  A Chiba needle was then inserted into the renal pelvis under fluoroscopic guidance. Contrast and air were injected after aspirating urine. A 21 gauge needle was then advanced into a gas-filled posterior interpolar region calyx over the 12th rib and removed over a 018 wire which was up sized to a Bentson. A copy catheter was advanced over the Bentson wire to the proximal ureter and contrast was injected demonstrating the ureteral leak from the proximal ureter into the urinoma.  The Kumpe the catheter was carefully advanced over a glidewire across the ureteral defect, into the distal  ureter, and finally into the bladder. Contrast was injected opacifying the bladder. The wire was removed over the 3 J.  A 26 cm 8.5 French double-J ureteral stent was advanced over the wire. It was coiled in the bladder and renal pelvis. The string was removed without complication.  A nephrostomy was then advanced over the 3 J and coiled in the renal pelvis. It was looped and string fixed. It was sewn to the skin.  COMPLICATIONS: None  FINDINGS: Imaging demonstrates 28 French drain placement into the urinoma.  Subsequent imaging demonstrates opacifying the renal collecting system with contrast and gas via the renal pelvis.  Next series of images demonstrate access into and interpolar region posterior gas-filled interpolar region calyx.  The next image demonstrates a nephrostogram via a copy catheter in the proximal ureter. This does demonstrate the ureteral leak into the urinoma has while as ureteral obstruction just beyond the ureteral defect. The defect occurs approximately 10 cm beyond the ureteropelvic junction.  The next series of images demonstrate placement of a double-J ureteral stent extending from the right renal pelvis to the bladder as well as placement of a 10 French nephrostomy coiled in the renal pelvis.  IMPRESSION: Successful percutaneous nephrostomy catheter placement and double-J ureteral stent placement for a ureteral leak occurring 10 cm beyond the UP junction. A urinoma drain was also placed. Once the urinoma drain ceases, it can be removed. Nephrostomy catheter can also be removed at that time.   Electronically Signed   By: Maryclare Bean M.D.   On: 03/04/2013 12:35   Ir Perc Nephrostomy Right  03/04/2013   CLINICAL DATA:  Partial right nephrectomy with urinoma  EXAM: ABSCESS DRAINAGE; PERC NEPHROSTOMY*R*; IR RIGHT PERC INTRO URET CATH; RIGHT NEPHROSTOGRAM; IR ULTRASOUND GUIDANCE TISSUE ABLATION  MEDICATIONS AND MEDICAL HISTORY: Versed 6 mg, Fentanyl 200 mcg.  Additional Medications: Rocephin 1  g.  ANESTHESIA/SEDATION: Moderate sedation time: 50 minutes  CONTRAST:  20 cc Omnipaque 300  FLUOROSCOPY TIME:  12 min and 42 seconds.  PROCEDURE: The procedure, risks, benefits, and alternatives were explained to the patient. Questions regarding the procedure were encouraged and answered. The patient understands and consents to the procedure.  The back was prepped with Betadine in a sterile fashion, and a sterile drape was applied covering the operative field. A sterile gown and sterile gloves were used for the procedure.  Under sonographic guidance, a 22 gauge Chiba needle was inserted into the urinoma. Yellowish clear fluid was aspirated. The needle was removed over a 018 wire which was up sized to a 3 J. a 10 [place] dilator followed by a 10 [place] drain were inserted. This was looped and string fixed and sewn to the skin.  A Chiba needle was then inserted into the renal pelvis under fluoroscopic guidance. Contrast and air were injected after aspirating urine. A 21 gauge needle was then advanced into a gas-filled posterior interpolar region calyx over the 12th rib and removed over a 018 wire which was up sized to a Bentson. A copy catheter was advanced over the Bentson wire to the proximal ureter and contrast was injected demonstrating the ureteral leak from the proximal ureter into the urinoma.  The Kumpe the catheter was carefully advanced over a glidewire across the ureteral defect, into the distal ureter, and finally into the bladder. Contrast was injected opacifying the bladder. The wire was removed over the 3 J.  A 26 cm 8.5 French double-J ureteral stent was advanced over the wire. It was coiled in the bladder and renal pelvis. The string was removed without complication.  A nephrostomy was then advanced over the 3 J and coiled in the renal pelvis. It was looped and string fixed. It was sewn to the skin.  COMPLICATIONS: None  FINDINGS: Imaging demonstrates 69 French drain placement into the urinoma.   Subsequent imaging demonstrates opacifying the renal collecting system with contrast and gas via the renal pelvis.  Next series of images demonstrate  access into and interpolar region posterior gas-filled interpolar region calyx.  The next image demonstrates a nephrostogram via a copy catheter in the proximal ureter. This does demonstrate the ureteral leak into the urinoma has while as ureteral obstruction just beyond the ureteral defect. The defect occurs approximately 10 cm beyond the ureteropelvic junction.  The next series of images demonstrate placement of a double-J ureteral stent extending from the right renal pelvis to the bladder as well as placement of a 10 French nephrostomy coiled in the renal pelvis.  IMPRESSION: Successful percutaneous nephrostomy catheter placement and double-J ureteral stent placement for a ureteral leak occurring 10 cm beyond the UP junction. A urinoma drain was also placed. Once the urinoma drain ceases, it can be removed. Nephrostomy catheter can also be removed at that time.   Electronically Signed   By: Maryclare Bean M.D.   On: 03/04/2013 12:35   Ir US Guide Bx Asp/drain  03/04/2013   CLINICAL DATA:  Partial right nephrectomy with urinoma  EXAM: ABSCESS DRAINAGE; PERC NEPHROSTOMY*R*; IR RIGHT PERC INTRO URET CATH; RIGHT NEPHROSTOGRAM; IR ULTRASOUND GUIDANCE TISSUE ABLATION  MEDICATIONS AND MEDICAL HISTORY: Versed 6 mg, Fentanyl 200 mcg.  Additional Medications: Rocephin 1 g.  ANESTHESIA/SEDATION: Moderate sedation time: 50 minutes  CONTRAST:  20 cc Omnipaque 300  FLUOROSCOPY TIME:  12 min and 42 seconds.  PROCEDURE: The procedure, risks, benefits, and alternatives were explained to the patient. Questions regarding the procedure were encouraged and answered. The patient understands and consents to the procedure.  The back was prepped with Betadine in a sterile fashion, and a sterile drape was applied covering the operative field. A sterile gown and sterile gloves were used for the  procedure.  Under sonographic guidance, a 22 gauge Chiba needle was inserted into the urinoma. Yellowish clear fluid was aspirated. The needle was removed over a 018 wire which was up sized to a 3 J. a 10 [place] dilator followed by a 10 [place] drain were inserted. This was looped and string fixed and sewn to the skin.  A Chiba needle was then inserted into the renal pelvis under fluoroscopic guidance. Contrast and air were injected after aspirating urine. A 21 gauge needle was then advanced into a gas-filled posterior interpolar region calyx over the 12th rib and removed over a 018 wire which was up sized to a Bentson. A copy catheter was advanced over the Bentson wire to the proximal ureter and contrast was injected demonstrating the ureteral leak from the proximal ureter into the urinoma.  The Kumpe the catheter was carefully advanced over a glidewire across the ureteral defect, into the distal ureter, and finally into the bladder. Contrast was injected opacifying the bladder. The wire was removed over the 3 J.  A 26 cm 8.5 French double-J ureteral stent was advanced over the wire. It was coiled in the bladder and renal pelvis. The string was removed without complication.  A nephrostomy was then advanced over the 3 J and coiled in the renal pelvis. It was looped and string fixed. It was sewn to the skin.  COMPLICATIONS: None  FINDINGS: Imaging demonstrates 76 French drain placement into the urinoma.  Subsequent imaging demonstrates opacifying the renal collecting system with contrast and gas via the renal pelvis.  Next series of images demonstrate access into and interpolar region posterior gas-filled interpolar region calyx.  The next image demonstrates a nephrostogram via a copy catheter in the proximal ureter. This does demonstrate the ureteral leak into the urinoma has while  as ureteral obstruction just beyond the ureteral defect. The defect occurs approximately 10 cm beyond the ureteropelvic junction.  The  next series of images demonstrate placement of a double-J ureteral stent extending from the right renal pelvis to the bladder as well as placement of a 10 French nephrostomy coiled in the renal pelvis.  IMPRESSION: Successful percutaneous nephrostomy catheter placement and double-J ureteral stent placement for a ureteral leak occurring 10 cm beyond the UP junction. A urinoma drain was also placed. Once the urinoma drain ceases, it can be removed. Nephrostomy catheter can also be removed at that time.   Electronically Signed   By: Maryclare Bean M.D.   On: 03/04/2013 12:35   Ir US Guide Bx Asp/drain  03/04/2013   CLINICAL DATA:  Partial right nephrectomy with urinoma  EXAM: ABSCESS DRAINAGE; PERC NEPHROSTOMY*R*; IR RIGHT PERC INTRO URET CATH; RIGHT NEPHROSTOGRAM; IR ULTRASOUND GUIDANCE TISSUE ABLATION  MEDICATIONS AND MEDICAL HISTORY: Versed 6 mg, Fentanyl 200 mcg.  Additional Medications: Rocephin 1 g.  ANESTHESIA/SEDATION: Moderate sedation time: 50 minutes  CONTRAST:  20 cc Omnipaque 300  FLUOROSCOPY TIME:  12 min and 42 seconds.  PROCEDURE: The procedure, risks, benefits, and alternatives were explained to the patient. Questions regarding the procedure were encouraged and answered. The patient understands and consents to the procedure.  The back was prepped with Betadine in a sterile fashion, and a sterile drape was applied covering the operative field. A sterile gown and sterile gloves were used for the procedure.  Under sonographic guidance, a 22 gauge Chiba needle was inserted into the urinoma. Yellowish clear fluid was aspirated. The needle was removed over a 018 wire which was up sized to a 3 J. a 10 [place] dilator followed by a 10 [place] drain were inserted. This was looped and string fixed and sewn to the skin.  A Chiba needle was then inserted into the renal pelvis under fluoroscopic guidance. Contrast and air were injected after aspirating urine. A 21 gauge needle was then advanced into a gas-filled  posterior interpolar region calyx over the 12th rib and removed over a 018 wire which was up sized to a Bentson. A copy catheter was advanced over the Bentson wire to the proximal ureter and contrast was injected demonstrating the ureteral leak from the proximal ureter into the urinoma.  The Kumpe the catheter was carefully advanced over a glidewire across the ureteral defect, into the distal ureter, and finally into the bladder. Contrast was injected opacifying the bladder. The wire was removed over the 3 J.  A 26 cm 8.5 French double-J ureteral stent was advanced over the wire. It was coiled in the bladder and renal pelvis. The string was removed without complication.  A nephrostomy was then advanced over the 3 J and coiled in the renal pelvis. It was looped and string fixed. It was sewn to the skin.  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  Subsequent imaging demonstrates opacifying the renal collecting system with contrast and gas via the renal pelvis.  Next series of images demonstrate access into and interpolar region posterior gas-filled interpolar region calyx.  The next image demonstrates a nephrostogram via a copy catheter in the proximal ureter. This does demonstrate the ureteral leak into the urinoma has while as ureteral obstruction just beyond the ureteral defect. The defect occurs approximately 10 cm beyond the ureteropelvic junction.  The next series of images demonstrate placement of a double-J ureteral stent extending from the right  renal pelvis to the bladder as well as placement of a 10 French nephrostomy coiled in the renal pelvis.  IMPRESSION: Successful percutaneous nephrostomy catheter placement and double-J ureteral stent placement for a ureteral leak occurring 10 cm beyond the UP junction. A urinoma drain was also placed. Once the urinoma drain ceases, it can be removed. Nephrostomy catheter can also be removed at that time.    Electronically Signed   By: Maryclare Bean M.D.   On: 03/04/2013 12:35   Ir [facility] Drone Cath Perc Right  03/04/2013   CLINICAL DATA:  Partial right nephrectomy with urinoma  EXAM: ABSCESS DRAINAGE; PERC NEPHROSTOMY*R*; IR RIGHT PERC INTRO URET CATH; RIGHT NEPHROSTOGRAM; IR ULTRASOUND GUIDANCE TISSUE ABLATION  MEDICATIONS AND MEDICAL HISTORY: Versed 6 mg, Fentanyl 200 mcg.  Additional Medications: Rocephin 1 g.  ANESTHESIA/SEDATION: Moderate sedation time: 50 minutes  CONTRAST:  20 cc Omnipaque 300  FLUOROSCOPY TIME:  12 min and 42 seconds.  PROCEDURE: The procedure, risks, benefits, and alternatives were explained to the patient. Questions regarding the procedure were encouraged and answered. The patient understands and consents to the procedure.  The back was prepped with Betadine in a sterile fashion, and a sterile drape was applied covering the operative field. A sterile gown and sterile gloves were used for the procedure.  Under sonographic guidance, a 22 gauge Chiba needle was inserted into the urinoma. Yellowish clear fluid was aspirated. The needle was removed over a 018 wire which was up sized to a 3 J. a 10 [place] dilator followed by a 10 [place] drain were inserted. This was looped and string fixed and sewn to the skin.  A Chiba needle was then inserted into the renal pelvis under fluoroscopic guidance. Contrast and air were injected after aspirating urine. A 21 gauge needle was then advanced into a gas-filled posterior interpolar region calyx over the 12th rib and removed over a 018 wire which was up sized to a Bentson. A copy catheter was advanced over the Bentson wire to the proximal ureter and contrast was injected demonstrating the ureteral leak from the proximal ureter into the urinoma.  The [place] the catheter was carefully advanced over a glidewire across the ureteral defect, into the distal ureter, and finally into the bladder. Contrast was injected opacifying the bladder. The wire was removed over the  3 J.  A 26 cm 8.5 French double-J ureteral stent was advanced over the wire. It was coiled in the bladder and renal pelvis. The string was removed without complication.  A nephrostomy was then advanced over the 3 J and coiled in the renal pelvis. It was looped and string fixed. It was sewn to the skin.  COMPLICATIONS: None  FINDINGS: Imaging demonstrates 77 French drain placement into the urinoma.  Subsequent imaging demonstrates opacifying the renal collecting system with contrast and gas via the renal pelvis.  Next series of images demonstrate access into and interpolar region posterior gas-filled interpolar region calyx.  The next image demonstrates a nephrostogram via a copy catheter in the proximal ureter. This does demonstrate the ureteral leak into the urinoma has while as ureteral obstruction just beyond the ureteral defect. The defect occurs approximately 10 cm beyond the ureteropelvic junction.  The next series of images demonstrate placement of a double-J ureteral stent extending from the right renal pelvis to the bladder as well as placement of a 10 French nephrostomy coiled in the renal pelvis.  IMPRESSION: Successful percutaneous nephrostomy catheter placement and double-J ureteral stent placement for a ureteral  leak occurring 10 cm beyond the UP junction. A urinoma drain was also placed. Once the urinoma drain ceases, it can be removed. Nephrostomy catheter can also be removed at that time.   Electronically Signed   By: Maryclare Bean M.D.   On: 03/04/2013 12:35    Scheduled Meds: . amLODipine  5 mg Oral Daily   And  . olmesartan  20 mg Oral Daily  . aspirin EC  81 mg Oral Daily  . cefTRIAXone (ROCEPHIN)  IV  1 g Intravenous Q24H  . mirabegron ER  25 mg Oral Daily  . pantoprazole  40 mg Oral Daily  . pravastatin  80 mg Oral q1800  . senna-docusate  1 tablet Oral BID   Continuous Infusions: . sodium chloride 75 mL/hr at 03/05/13 0201    Time spent: 25 minutes.   LOS: 2 days    Ezmeralda Stefanick  [facility] Pager [phone].   *Please note that the hospitalists switch teams on Wednesdays. Please call the flow manager at [phone] if you are having difficulty reaching the hospitalist taking care of this patient as she can update you and provide the most up-to-date pager number of provider caring for the patient. If 8PM-8AM, please contact night-coverage at [email or web address], password [facility] [place]  03/05/2013, 8:19 AM

## 2013-03-05 NOTE — Progress Notes (Signed)
Subjective:  1- Moderate Risk Prostate Cancer - s/p radical prostatectomy 01/2013 for [id number] Gleason 3+4=7 adenocarcinoma, margins negative. Was previously on surveillance for low-risk disease that had progressed by protocol-biopsy.   2 - Right Renal Oncocytoma - s/p right robotic partial nephrectomy 01/2013 for 3.5cm lower pole enhancing mass, path confirmed benign oncocytoma.   3 - Rt Retroperitoneal Urinoma / Ureteral Leak - Pt admitted 1/4 and found to have right retroperitoneal urinoma and elevated Cr worrisome for urine leak from recent partial nephrectomy. He had minimal drain output and normal Cr when discharged postop. IR placed urinoma drain 1/5 and also rt nephrostomy + JJ ureteral stent, found to have extravasation from mid ureter (not at level of prior renorrhaphy).  Today Sriram is seen in f/u above. As expected there is majority of output from Rt neph tube (750cc) v. Urinoma (50cc) overnight. Cr this AM now 1.1.  Objective: Vital signs in last 24 hours: Temp:  [98.2 F (36.8 C)-100.1 F (37.8 C)] 98.2 F (36.8 C) (01/06 0426) Pulse Rate:  [77-106] 77 (01/06 0426) Resp:  [18-28] 18 (01/06 0426) BP: (101-160)/(52-95) 120/62 mmHg (01/06 0426) SpO2:  [95 %-98 %] 95 % (01/06 0426) Weight:  [94.3 kg (207 lb 14.3 oz)] 94.3 kg (207 lb 14.3 oz) (01/06 0426) Last BM Date: 03/04/13  Intake/Output from previous day: 01/05 0701 - 01/06 0700 In: 1743.8 [P.O.:480; I.V.:1253.8] Out: 1815 [Urine:1815] Intake/Output this shift: Total I/O In: -  Out: 275 [Urine:275]  General appearance: alert, cooperative and appears stated age Head: Normocephalic, without obvious abnormality, atraumatic Eyes: conjunctivae/corneas clear. PERRL, EOM's intact. Fundi benign. Ears: normal TM's and external ear canals both ears Nose: Nares normal. Septum midline. Mucosa normal. No drainage or sinus tenderness. Throat: lips, mucosa, and tongue normal; teeth and gums normal Neck: no adenopathy, no  carotid bruit, no JVD, supple, symmetrical, trachea midline and thyroid not enlarged, symmetric, no tenderness/mass/nodules Back: symmetric, no curvature. ROM normal. No CVA tenderness. Resp: clear to auscultation bilaterally Chest wall: no tenderness Cardio: regular rate and rhythm, S1, S2 normal, no murmur, click, rub or gallop GI: soft, non-tender; bowel sounds normal; no masses,  no organomegaly Male genitalia: normal Extremities: extremities normal, atraumatic, no cyanosis or edema Pulses: 2+ and symmetric Skin: Skin color, texture, turgor normal. No rashes or lesions Lymph nodes: Cervical, supraclavicular, and axillary nodes normal. Neurologic: Grossly normal Incision/Wound: REcent surgical sites c/d/i. Rt neph tube with copius uriniferous fluid. Rt urinoma drain with minimal uriniferous fluid.   Lab Results:   Recent Labs  03/04/13 0515 03/05/13 0500  WBC 11.8* 7.8  HGB 11.0* 9.9*  HCT 33.2* 29.6*  PLT 248 273   BMET  Recent Labs  03/04/13 0515 03/05/13 0500  NA 138 139  K 4.4 4.1  CL 105 106  CO2 20 21  GLUCOSE 99 98  BUN 24* 18  CREATININE 1.91* 1.11  CALCIUM 9.9 9.9   PT/INR  Recent Labs  03/03/13 1512  LABPROT 15.5*  INR 1.26   ABG No results found for this basename: PHART, PCO2, PO2, HCO3,  in the last 72 hours  Studies/Results: Ct Abdomen Pelvis Wo Contrast  03/03/2013   CLINICAL DATA:  Fever in weakness. Prior prostatectomy for prostate cancer. Partial right nephrectomy/lymphadenectomy 02/15/2013.  EXAM: CT ABDOMEN AND PELVIS WITHOUT CONTRAST  TECHNIQUE: Multidetector CT imaging of the abdomen and pelvis was performed following the standard protocol without intravenous contrast.  COMPARISON:  01/18/2013 and 04/07/2005  FINDINGS: Lung bases demonstrate a small right pleural effusion with  associated atelectasis which is new. The minimal calcification over the left anterior descending and lateral circumflex arteries.  Abdominal images demonstrate a  normal liver, spleen, pancreas, gallbladder and adrenal glands. Left kidney is normal in size with a few small stones the largest over the upper pole measuring 5 mm. The left ureter is normal. The right kidney is normal in size with a couple small 1-2 mm nonobstructing stones. There is mild right-sided hydronephrosis. There is a 7.4 x 9.6 cm predominately simple appearing fluid collection adjacent to an immediately below the inferior pole of the right kidney. There is stranding of the right perinephric fat. The proximal right ureter courses through some of this fluid collection as the ureter is otherwise within normal. The appendix is normal. No evidence of bowel obstruction.  There postsurgical changes over the umbilical region. Pelvic images demonstrate surgical absence of the prostate. Small left inguinal hernia containing only mesenteric fat. Remainder of the exam is unchanged.  IMPRESSION: 7.4 x 9.6 cm predominately simple appearing fluid collection adjacent to and inferior to the lower pole of the right kidney. Patient underwent recent partial right nephrectomy of lower pole exophytic mass 02/15/2013 as this postoperative fluid collection may be infectious versus noninfectious in nature. Proximal right ureter courses through the anterior medial aspect of this fluid collection as there is mild dilatation of the right intrarenal collecting system.  Bilateral nephrolithiasis.  Small moderate pleural fluid with associated atelectasis.  Prior prostatectomy.   Electronically Signed   By: Marin Olp M.D.   On: 03/03/2013 13:11   Dg Chest 2 View  03/03/2013   CLINICAL DATA:  Fever.  History of hypertension and prostate cancer.  EXAM: CHEST  2 VIEW  COMPARISON:  02/12/2013 and 03/10/2009.  FINDINGS: There are lower lung volumes with mildly increased linear atelectasis at both lung bases. No airspace disease, edema or pleural effusion is identified. The heart size and mediastinal contours are stable for the degree  of inspiration. Thoracic kyphosis and spondylosis appear unchanged.  IMPRESSION: Lower lung volumes with mildly increased linear bibasilar atelectasis. No evidence of focal airspace disease.   Electronically Signed   By: Camie Patience M.D.   On: 03/03/2013 09:52   Ir Guided Niel Hummer W Catheter Placement  03/04/2013   CLINICAL DATA:  Partial right nephrectomy with urinoma  EXAM: ABSCESS DRAINAGE; PERC NEPHROSTOMY*R*; IR RIGHT PERC INTRO URET CATH; RIGHT NEPHROSTOGRAM; IR ULTRASOUND GUIDANCE TISSUE ABLATION  MEDICATIONS AND MEDICAL HISTORY: Versed 6 mg, Fentanyl 200 mcg.  Additional Medications: Rocephin 1 g.  ANESTHESIA/SEDATION: Moderate sedation time: 50 minutes  CONTRAST:  20 cc Omnipaque 300  FLUOROSCOPY TIME:  12 min and 42 seconds.  PROCEDURE: The procedure, risks, benefits, and alternatives were explained to the patient. Questions regarding the procedure were encouraged and answered. The patient understands and consents to the procedure.  The back was prepped with Betadine in a sterile fashion, and a sterile drape was applied covering the operative field. A sterile gown and sterile gloves were used for the procedure.  Under sonographic guidance, a 22 gauge Chiba needle was inserted into the urinoma. Yellowish clear fluid was aspirated. The needle was removed over a 018 wire which was up sized to a 3 J. a 10 [place] dilator followed by a 10 [place] drain were inserted. This was looped and string fixed and sewn to the skin.  A Chiba needle was then inserted into the renal pelvis under fluoroscopic guidance. Contrast and air were injected after aspirating urine. A 21 gauge  needle was then advanced into a gas-filled posterior interpolar region calyx over the 12th rib and removed over a 018 wire which was up sized to a Bentson. A copy catheter was advanced over the Bentson wire to the proximal ureter and contrast was injected demonstrating the ureteral leak from the proximal ureter into the urinoma.  The Kumpe the  catheter was carefully advanced over a glidewire across the ureteral defect, into the distal ureter, and finally into the bladder. Contrast was injected opacifying the bladder. The wire was removed over the 3 J.  A 26 cm 8.5 French double-J ureteral stent was advanced over the wire. It was coiled in the bladder and renal pelvis. The string was removed without complication.  A nephrostomy was then advanced over the 3 J and coiled in the renal pelvis. It was looped and string fixed. It was sewn to the skin.  COMPLICATIONS: None  FINDINGS: Imaging demonstrates 52 French drain placement into the urinoma.  Subsequent imaging demonstrates opacifying the renal collecting system with contrast and gas via the renal pelvis.  Next series of images demonstrate access into and interpolar region posterior gas-filled interpolar region calyx.  The next image demonstrates a nephrostogram via a copy catheter in the proximal ureter. This does demonstrate the ureteral leak into the urinoma has while as ureteral obstruction just beyond the ureteral defect. The defect occurs approximately 10 cm beyond the ureteropelvic junction.  The next series of images demonstrate placement of a double-J ureteral stent extending from the right renal pelvis to the bladder as well as placement of a 10 French nephrostomy coiled in the renal pelvis.  IMPRESSION: Successful percutaneous nephrostomy catheter placement and double-J ureteral stent placement for a ureteral leak occurring 10 cm beyond the UP junction. A urinoma drain was also placed. Once the urinoma drain ceases, it can be removed. Nephrostomy catheter can also be removed at that time.   Electronically Signed   By: Maryclare Bean M.D.   On: 03/04/2013 12:35   Ir Nephrostogram Right  03/04/2013   CLINICAL DATA:  Partial right nephrectomy with urinoma  EXAM: ABSCESS DRAINAGE; PERC NEPHROSTOMY*R*; IR RIGHT PERC INTRO URET CATH; RIGHT NEPHROSTOGRAM; IR ULTRASOUND GUIDANCE TISSUE ABLATION  MEDICATIONS  AND MEDICAL HISTORY: Versed 6 mg, Fentanyl 200 mcg.  Additional Medications: Rocephin 1 g.  ANESTHESIA/SEDATION: Moderate sedation time: 50 minutes  CONTRAST:  20 cc Omnipaque 300  FLUOROSCOPY TIME:  12 min and 42 seconds.  PROCEDURE: The procedure, risks, benefits, and alternatives were explained to the patient. Questions regarding the procedure were encouraged and answered. The patient understands and consents to the procedure.  The back was prepped with Betadine in a sterile fashion, and a sterile drape was applied covering the operative field. A sterile gown and sterile gloves were used for the procedure.  Under sonographic guidance, a 22 gauge Chiba needle was inserted into the urinoma. Yellowish clear fluid was aspirated. The needle was removed over a 018 wire which was up sized to a 3 J. a 10 [place] dilator followed by a 10 [place] drain were inserted. This was looped and string fixed and sewn to the skin.  A Chiba needle was then inserted into the renal pelvis under fluoroscopic guidance. Contrast and air were injected after aspirating urine. A 21 gauge needle was then advanced into a gas-filled posterior interpolar region calyx over the 12th rib and removed over a 018 wire which was up sized to a Bentson. A copy catheter was advanced over the Bentson wire  to the proximal ureter and contrast was injected demonstrating the ureteral leak from the proximal ureter into the urinoma.  The Kumpe the catheter was carefully advanced over a glidewire across the ureteral defect, into the distal ureter, and finally into the bladder. Contrast was injected opacifying the bladder. The wire was removed over the 3 J.  A 26 cm 8.5 French double-J ureteral stent was advanced over the wire. It was coiled in the bladder and renal pelvis. The string was removed without complication.  A nephrostomy was then advanced over the 3 J and coiled in the renal pelvis. It was looped and string fixed. It was sewn to the skin.  COMPLICATIONS:  None  FINDINGS: Imaging demonstrates 68 French drain placement into the urinoma.  Subsequent imaging demonstrates opacifying the renal collecting system with contrast and gas via the renal pelvis.  Next series of images demonstrate access into and interpolar region posterior gas-filled interpolar region calyx.  The next image demonstrates a nephrostogram via a copy catheter in the proximal ureter. This does demonstrate the ureteral leak into the urinoma has while as ureteral obstruction just beyond the ureteral defect. The defect occurs approximately 10 cm beyond the ureteropelvic junction.  The next series of images demonstrate placement of a double-J ureteral stent extending from the right renal pelvis to the bladder as well as placement of a 10 French nephrostomy coiled in the renal pelvis.  IMPRESSION: Successful percutaneous nephrostomy catheter placement and double-J ureteral stent placement for a ureteral leak occurring 10 cm beyond the UP junction. A urinoma drain was also placed. Once the urinoma drain ceases, it can be removed. Nephrostomy catheter can also be removed at that time.   Electronically Signed   By: Maryclare Bean M.D.   On: 03/04/2013 12:35   Ir Perc Nephrostomy Right  03/04/2013   CLINICAL DATA:  Partial right nephrectomy with urinoma  EXAM: ABSCESS DRAINAGE; PERC NEPHROSTOMY*R*; IR RIGHT PERC INTRO URET CATH; RIGHT NEPHROSTOGRAM; IR ULTRASOUND GUIDANCE TISSUE ABLATION  MEDICATIONS AND MEDICAL HISTORY: Versed 6 mg, Fentanyl 200 mcg.  Additional Medications: Rocephin 1 g.  ANESTHESIA/SEDATION: Moderate sedation time: 50 minutes  CONTRAST:  20 cc Omnipaque 300  FLUOROSCOPY TIME:  12 min and 42 seconds.  PROCEDURE: The procedure, risks, benefits, and alternatives were explained to the patient. Questions regarding the procedure were encouraged and answered. The patient understands and consents to the procedure.  The back was prepped with Betadine in a sterile fashion, and a sterile drape was applied  covering the operative field. A sterile gown and sterile gloves were used for the procedure.  Under sonographic guidance, a 22 gauge Chiba needle was inserted into the urinoma. Yellowish clear fluid was aspirated. The needle was removed over a 018 wire which was up sized to a 3 J. a 10 [place] dilator followed by a 10 [place] drain were inserted. This was looped and string fixed and sewn to the skin.  A Chiba needle was then inserted into the renal pelvis under fluoroscopic guidance. Contrast and air were injected after aspirating urine. A 21 gauge needle was then advanced into a gas-filled posterior interpolar region calyx over the 12th rib and removed over a 018 wire which was up sized to a Bentson. A copy catheter was advanced over the Bentson wire to the proximal ureter and contrast was injected demonstrating the ureteral leak from the proximal ureter into the urinoma.  The Kumpe the catheter was carefully advanced over a glidewire across the ureteral defect, into the  distal ureter, and finally into the bladder. Contrast was injected opacifying the bladder. The wire was removed over the 3 J.  A 26 cm 8.5 French double-J ureteral stent was advanced over the wire. It was coiled in the bladder and renal pelvis. The string was removed without complication.  A nephrostomy was then advanced over the 3 J and coiled in the renal pelvis. It was looped and string fixed. It was sewn to the skin.  COMPLICATIONS: None  FINDINGS: Imaging demonstrates 40 French drain placement into the urinoma.  Subsequent imaging demonstrates opacifying the renal collecting system with contrast and gas via the renal pelvis.  Next series of images demonstrate access into and interpolar region posterior gas-filled interpolar region calyx.  The next image demonstrates a nephrostogram via a copy catheter in the proximal ureter. This does demonstrate the ureteral leak into the urinoma has while as ureteral obstruction just beyond the ureteral  defect. The defect occurs approximately 10 cm beyond the ureteropelvic junction.  The next series of images demonstrate placement of a double-J ureteral stent extending from the right renal pelvis to the bladder as well as placement of a 10 French nephrostomy coiled in the renal pelvis.  IMPRESSION: Successful percutaneous nephrostomy catheter placement and double-J ureteral stent placement for a ureteral leak occurring 10 cm beyond the UP junction. A urinoma drain was also placed. Once the urinoma drain ceases, it can be removed. Nephrostomy catheter can also be removed at that time.   Electronically Signed   By: Maryclare Bean M.D.   On: 03/04/2013 12:35   Ir US Guide Bx Asp/drain  03/04/2013   CLINICAL DATA:  Partial right nephrectomy with urinoma  EXAM: ABSCESS DRAINAGE; PERC NEPHROSTOMY*R*; IR RIGHT PERC INTRO URET CATH; RIGHT NEPHROSTOGRAM; IR ULTRASOUND GUIDANCE TISSUE ABLATION  MEDICATIONS AND MEDICAL HISTORY: Versed 6 mg, Fentanyl 200 mcg.  Additional Medications: Rocephin 1 g.  ANESTHESIA/SEDATION: Moderate sedation time: 50 minutes  CONTRAST:  20 cc Omnipaque 300  FLUOROSCOPY TIME:  12 min and 42 seconds.  PROCEDURE: The procedure, risks, benefits, and alternatives were explained to the patient. Questions regarding the procedure were encouraged and answered. The patient understands and consents to the procedure.  The back was prepped with Betadine in a sterile fashion, and a sterile drape was applied covering the operative field. A sterile gown and sterile gloves were used for the procedure.  Under sonographic guidance, a 22 gauge Chiba needle was inserted into the urinoma. Yellowish clear fluid was aspirated. The needle was removed over a 018 wire which was up sized to a 3 J. a 10 [place] dilator followed by a 10 [place] drain were inserted. This was looped and string fixed and sewn to the skin.  A Chiba needle was then inserted into the renal pelvis under fluoroscopic guidance. Contrast and air were injected  after aspirating urine. A 21 gauge needle was then advanced into a gas-filled posterior interpolar region calyx over the 12th rib and removed over a 018 wire which was up sized to a Bentson. A copy catheter was advanced over the Bentson wire to the proximal ureter and contrast was injected demonstrating the ureteral leak from the proximal ureter into the urinoma.  The Kumpe the catheter was carefully advanced over a glidewire across the ureteral defect, into the distal ureter, and finally into the bladder. Contrast was injected opacifying the bladder. The wire was removed over the 3 J.  A 26 cm 8.5 French double-J ureteral stent was advanced over the wire.  It was coiled in the bladder and renal pelvis. The string was removed without complication.  A nephrostomy was then advanced over the 3 J and coiled in the renal pelvis. It was looped and string fixed. It was sewn to the skin.  COMPLICATIONS: None  FINDINGS: Imaging demonstrates 4510 French drain placement into the urinoma.  Subsequent imaging demonstrates opacifying the renal collecting system with contrast and gas via the renal pelvis.  Next series of images demonstrate access into and interpolar region posterior gas-filled interpolar region calyx.  The next image demonstrates a nephrostogram via a copy catheter in the proximal ureter. This does demonstrate the ureteral leak into the urinoma has while as ureteral obstruction just beyond the ureteral defect. The defect occurs approximately 10 cm beyond the ureteropelvic junction.  The next series of images demonstrate placement of a double-J ureteral stent extending from the right renal pelvis to the bladder as well as placement of a 10 French nephrostomy coiled in the renal pelvis.  IMPRESSION: Successful percutaneous nephrostomy catheter placement and double-J ureteral stent placement for a ureteral leak occurring 10 cm beyond the UP junction. A urinoma drain was also placed. Once the urinoma drain ceases, it can  be removed. Nephrostomy catheter can also be removed at that time.   Electronically Signed   By: Maryclare BeanArt  Hoss M.D.   On: 03/04/2013 12:35   Ir [place] Guide Bx Asp/drain  03/04/2013   CLINICAL DATA:  Partial right nephrectomy with urinoma  EXAM: ABSCESS DRAINAGE; PERC NEPHROSTOMY*R*; IR RIGHT PERC INTRO URET CATH; RIGHT NEPHROSTOGRAM; IR ULTRASOUND GUIDANCE TISSUE ABLATION  MEDICATIONS AND MEDICAL HISTORY: Versed 6 mg, Fentanyl 200 mcg.  Additional Medications: Rocephin 1 g.  ANESTHESIA/SEDATION: Moderate sedation time: 50 minutes  CONTRAST:  20 cc Omnipaque 300  FLUOROSCOPY TIME:  12 min and 42 seconds.  PROCEDURE: The procedure, risks, benefits, and alternatives were explained to the patient. Questions regarding the procedure were encouraged and answered. The patient understands and consents to the procedure.  The back was prepped with Betadine in a sterile fashion, and a sterile drape was applied covering the operative field. A sterile gown and sterile gloves were used for the procedure.  Under sonographic guidance, a 22 gauge Chiba needle was inserted into the urinoma. Yellowish clear fluid was aspirated. The needle was removed over a 018 wire which was up sized to a 3 J. a 10 [place] dilator followed by a 10 [place] drain were inserted. This was looped and string fixed and sewn to the skin.  A Chiba needle was then inserted into the renal pelvis under fluoroscopic guidance. Contrast and air were injected after aspirating urine. A 21 gauge needle was then advanced into a gas-filled posterior interpolar region calyx over the 12th rib and removed over a 018 wire which was up sized to a Bentson. A copy catheter was advanced over the Bentson wire to the proximal ureter and contrast was injected demonstrating the ureteral leak from the proximal ureter into the urinoma.  The Kumpe the catheter was carefully advanced over a glidewire across the ureteral defect, into the distal ureter, and finally into the bladder. Contrast was  injected opacifying the bladder. The wire was removed over the 3 J.  A 26 cm 8.5 French double-J ureteral stent was advanced over the wire. It was coiled in the bladder and renal pelvis. The string was removed without complication.  A nephrostomy was then advanced over the 3 J and coiled in the renal pelvis. It was looped and  string fixed. It was sewn to the skin.  COMPLICATIONS: None  FINDINGS: Imaging demonstrates 18 French drain placement into the urinoma.  Subsequent imaging demonstrates opacifying the renal collecting system with contrast and gas via the renal pelvis.  Next series of images demonstrate access into and interpolar region posterior gas-filled interpolar region calyx.  The next image demonstrates a nephrostogram via a copy catheter in the proximal ureter. This does demonstrate the ureteral leak into the urinoma has while as ureteral obstruction just beyond the ureteral defect. The defect occurs approximately 10 cm beyond the ureteropelvic junction.  The next series of images demonstrate placement of a double-J ureteral stent extending from the right renal pelvis to the bladder as well as placement of a 10 French nephrostomy coiled in the renal pelvis.  IMPRESSION: Successful percutaneous nephrostomy catheter placement and double-J ureteral stent placement for a ureteral leak occurring 10 cm beyond the UP junction. A urinoma drain was also placed. Once the urinoma drain ceases, it can be removed. Nephrostomy catheter can also be removed at that time.   Electronically Signed   By: Maryclare Bean M.D.   On: 03/04/2013 12:35   Ir Oris Drone Cath Perc Right  03/04/2013   CLINICAL DATA:  Partial right nephrectomy with urinoma  EXAM: ABSCESS DRAINAGE; PERC NEPHROSTOMY*R*; IR RIGHT PERC INTRO URET CATH; RIGHT NEPHROSTOGRAM; IR ULTRASOUND GUIDANCE TISSUE ABLATION  MEDICATIONS AND MEDICAL HISTORY: Versed 6 mg, Fentanyl 200 mcg.  Additional Medications: Rocephin 1 g.  ANESTHESIA/SEDATION: Moderate sedation time:  50 minutes  CONTRAST:  20 cc Omnipaque 300  FLUOROSCOPY TIME:  12 min and 42 seconds.  PROCEDURE: The procedure, risks, benefits, and alternatives were explained to the patient. Questions regarding the procedure were encouraged and answered. The patient understands and consents to the procedure.  The back was prepped with Betadine in a sterile fashion, and a sterile drape was applied covering the operative field. A sterile gown and sterile gloves were used for the procedure.  Under sonographic guidance, a 22 gauge Chiba needle was inserted into the urinoma. Yellowish clear fluid was aspirated. The needle was removed over a 018 wire which was up sized to a 3 J. a 10 [place] dilator followed by a 10 [place] drain were inserted. This was looped and string fixed and sewn to the skin.  A Chiba needle was then inserted into the renal pelvis under fluoroscopic guidance. Contrast and air were injected after aspirating urine. A 21 gauge needle was then advanced into a gas-filled posterior interpolar region calyx over the 12th rib and removed over a 018 wire which was up sized to a Bentson. A copy catheter was advanced over the Bentson wire to the proximal ureter and contrast was injected demonstrating the ureteral leak from the proximal ureter into the urinoma.  The [place] the catheter was carefully advanced over a glidewire across the ureteral defect, into the distal ureter, and finally into the bladder. Contrast was injected opacifying the bladder. The wire was removed over the 3 J.  A 26 cm 8.5 French double-J ureteral stent was advanced over the wire. It was coiled in the bladder and renal pelvis. The string was removed without complication.  A nephrostomy was then advanced over the 3 J and coiled in the renal pelvis. It was looped and string fixed. It was sewn to the skin.  COMPLICATIONS: None  FINDINGS: Imaging demonstrates 80 French drain placement into the urinoma.  Subsequent imaging demonstrates opacifying the renal  collecting system with contrast and  gas via the renal pelvis.  Next series of images demonstrate access into and interpolar region posterior gas-filled interpolar region calyx.  The next image demonstrates a nephrostogram via a copy catheter in the proximal ureter. This does demonstrate the ureteral leak into the urinoma has while as ureteral obstruction just beyond the ureteral defect. The defect occurs approximately 10 cm beyond the ureteropelvic junction.  The next series of images demonstrate placement of a double-J ureteral stent extending from the right renal pelvis to the bladder as well as placement of a 10 French nephrostomy coiled in the renal pelvis.  IMPRESSION: Successful percutaneous nephrostomy catheter placement and double-J ureteral stent placement for a ureteral leak occurring 10 cm beyond the UP junction. A urinoma drain was also placed. Once the urinoma drain ceases, it can be removed. Nephrostomy catheter can also be removed at that time.   Electronically Signed   By: Maryclare Bean M.D.   On: 03/04/2013 12:35    Anti-infectives: Anti-infectives   Start     Dose/Rate Route Frequency Ordered Stop   03/04/13 1000  cefTRIAXone (ROCEPHIN) 1 g in dextrose 5 % 50 mL IVPB     1 g 100 mL/hr over 30 Minutes Intravenous Every 24 hours 03/03/13 1436     03/03/13 1030  cefTRIAXone (ROCEPHIN) 1 g in dextrose 5 % 50 mL IVPB     1 g 100 mL/hr over 30 Minutes Intravenous  Once 03/03/13 1020 03/03/13 1138      Assessment/Plan:  1- Moderate Risk Prostate Cancer - first PSA to be drawn around 03/2013.   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   3 - Rt Retroperitoneal Urinoma / Ureteral Leak - Drains functioning well and serum Cr back to normal suggests good placement. UCX from urinoma pending. I feel it would be safe to DC from GU perspective once urinoma CX final, maybe as soon as 1/7.  Please call me anytime with questions. Greatly appreciate hospitalist help.    [facility], Bettymae Yott 03/05/2013

## 2013-03-05 NOTE — Progress Notes (Signed)
Subjective: Pt feeling fair; still with pelvic discomfort  Objective: Vital signs in last 24 hours: Temp:  [98.2 F (36.8 C)-99 F (37.2 C)] 98.2 F (36.8 C) (01/06 0426) Pulse Rate:  [77-88] 77 (01/06 0426) Resp:  [18-20] 18 (01/06 0426) BP: (101-121)/(50-65) 111/50 mmHg (01/06 1037) SpO2:  [95 %-96 %] 95 % (01/06 0426) Weight:  [207 lb 14.3 oz (94.3 kg)] 207 lb 14.3 oz (94.3 kg) (01/06 0426) Last BM Date: 03/04/13  Intake/Output from previous day: 01/05 0701 - 01/06 0700 In: 1818.8 [P.O.:480; I.V.:1328.8] Out: 1815 [Urine:1815] Intake/Output this shift: Total I/O In: 772.5 [P.O.:240; I.V.:482.5; IV Piggyback:50] Out: 475 [Urine:475]  Right PCN /urinoma drains intact, dressings dry, sites mildly tender; outputs 350/25 cc's respectively- yellow urine in PCN ,blood-tinged urine in urinoma bag, creat nl; cx's pend  Lab Results:   Recent Labs  03/04/13 0515 03/05/13 0500  WBC 11.8* 7.8  HGB 11.0* 9.9*  HCT 33.2* 29.6*  PLT 248 273   BMET  Recent Labs  03/04/13 0515 03/05/13 0500  NA 138 139  K 4.4 4.1  CL 105 106  CO2 20 21  GLUCOSE 99 98  BUN 24* 18  CREATININE 1.91* 1.11  CALCIUM 9.9 9.9   PT/INR  Recent Labs  03/03/13 1512  LABPROT 15.5*  INR 1.26   ABG No results found for this basename: PHART, PCO2, PO2, HCO3,  in the last 72 hours  Studies/Results: Ir Guided Drain W Catheter Placement  03/04/2013   CLINICAL DATA:  Partial right nephrectomy with urinoma  EXAM: ABSCESS DRAINAGE; PERC NEPHROSTOMY*R*; IR RIGHT PERC INTRO URET CATH; RIGHT NEPHROSTOGRAM; IR ULTRASOUND GUIDANCE TISSUE ABLATION  MEDICATIONS AND MEDICAL HISTORY: Versed 6 mg, Fentanyl 200 mcg.  Additional Medications: Rocephin 1 g.  ANESTHESIA/SEDATION: Moderate sedation time: 50 minutes  CONTRAST:  20 cc Omnipaque 300  FLUOROSCOPY TIME:  12 min and 42 seconds.  PROCEDURE: The procedure, risks, benefits, and alternatives were explained to the patient. Questions regarding the procedure  were encouraged and answered. The patient understands and consents to the procedure.  The back was prepped with Betadine in a sterile fashion, and a sterile drape was applied covering the operative field. A sterile gown and sterile gloves were used for the procedure.  Under sonographic guidance, a 22 gauge Chiba needle was inserted into the urinoma. Yellowish clear fluid was aspirated. The needle was removed over a 018 wire which was up sized to a 3 J. a 10 [place] dilator followed by a 10 [place] drain were inserted. This was looped and string fixed and sewn to the skin.  A Chiba needle was then inserted into the renal pelvis under fluoroscopic guidance. Contrast and air were injected after aspirating urine. A 21 gauge needle was then advanced into a gas-filled posterior interpolar region calyx over the 12th rib and removed over a 018 wire which was up sized to a Bentson. A copy catheter was advanced over the Bentson wire to the proximal ureter and contrast was injected demonstrating the ureteral leak from the proximal ureter into the urinoma.  The Kumpe the catheter was carefully advanced over a glidewire across the ureteral defect, into the distal ureter, and finally into the bladder. Contrast was injected opacifying the bladder. The wire was removed over the 3 J.  A 26 cm 8.5 French double-J ureteral stent was advanced over the wire. It was coiled in the bladder and renal pelvis. The string was removed without complication.  A nephrostomy was then advanced over the 3 J  and coiled in the renal pelvis. It was looped and string fixed. It was sewn to the skin.  COMPLICATIONS: None  FINDINGS: Imaging demonstrates 49 French drain placement into the urinoma.  Subsequent imaging demonstrates opacifying the renal collecting system with contrast and gas via the renal pelvis.  Next series of images demonstrate access into and interpolar region posterior gas-filled interpolar region calyx.  The next image demonstrates a  nephrostogram via a copy catheter in the proximal ureter. This does demonstrate the ureteral leak into the urinoma has while as ureteral obstruction just beyond the ureteral defect. The defect occurs approximately 10 cm beyond the ureteropelvic junction.  The next series of images demonstrate placement of a double-J ureteral stent extending from the right renal pelvis to the bladder as well as placement of a 10 French nephrostomy coiled in the renal pelvis.  IMPRESSION: Successful percutaneous nephrostomy catheter placement and double-J ureteral stent placement for a ureteral leak occurring 10 cm beyond the UP junction. A urinoma drain was also placed. Once the urinoma drain ceases, it can be removed. Nephrostomy catheter can also be removed at that time.   Electronically Signed   By: Maryclare Bean M.D.   On: 03/04/2013 12:35   Ir Nephrostogram Right  03/04/2013   CLINICAL DATA:  Partial right nephrectomy with urinoma  EXAM: ABSCESS DRAINAGE; PERC NEPHROSTOMY*R*; IR RIGHT PERC INTRO URET CATH; RIGHT NEPHROSTOGRAM; IR ULTRASOUND GUIDANCE TISSUE ABLATION  MEDICATIONS AND MEDICAL HISTORY: Versed 6 mg, Fentanyl 200 mcg.  Additional Medications: Rocephin 1 g.  ANESTHESIA/SEDATION: Moderate sedation time: 50 minutes  CONTRAST:  20 cc Omnipaque 300  FLUOROSCOPY TIME:  12 min and 42 seconds.  PROCEDURE: The procedure, risks, benefits, and alternatives were explained to the patient. Questions regarding the procedure were encouraged and answered. The patient understands and consents to the procedure.  The back was prepped with Betadine in a sterile fashion, and a sterile drape was applied covering the operative field. A sterile gown and sterile gloves were used for the procedure.  Under sonographic guidance, a 22 gauge Chiba needle was inserted into the urinoma. Yellowish clear fluid was aspirated. The needle was removed over a 018 wire which was up sized to a 3 J. a 10 [place] dilator followed by a 10 [place] drain were  inserted. This was looped and string fixed and sewn to the skin.  A Chiba needle was then inserted into the renal pelvis under fluoroscopic guidance. Contrast and air were injected after aspirating urine. A 21 gauge needle was then advanced into a gas-filled posterior interpolar region calyx over the 12th rib and removed over a 018 wire which was up sized to a Bentson. A copy catheter was advanced over the Bentson wire to the proximal ureter and contrast was injected demonstrating the ureteral leak from the proximal ureter into the urinoma.  The Kumpe the catheter was carefully advanced over a glidewire across the ureteral defect, into the distal ureter, and finally into the bladder. Contrast was injected opacifying the bladder. The wire was removed over the 3 J.  A 26 cm 8.5 French double-J ureteral stent was advanced over the wire. It was coiled in the bladder and renal pelvis. The string was removed without complication.  A nephrostomy was then advanced over the 3 J and coiled in the renal pelvis. It was looped and string fixed. It was sewn to the skin.  COMPLICATIONS: None  FINDINGS: Imaging demonstrates 59 French drain placement into the urinoma.  Subsequent imaging demonstrates opacifying  the renal collecting system with contrast and gas via the renal pelvis.  Next series of images demonstrate access into and interpolar region posterior gas-filled interpolar region calyx.  The next image demonstrates a nephrostogram via a copy catheter in the proximal ureter. This does demonstrate the ureteral leak into the urinoma has while as ureteral obstruction just beyond the ureteral defect. The defect occurs approximately 10 cm beyond the ureteropelvic junction.  The next series of images demonstrate placement of a double-J ureteral stent extending from the right renal pelvis to the bladder as well as placement of a 10 French nephrostomy coiled in the renal pelvis.  IMPRESSION: Successful percutaneous nephrostomy  catheter placement and double-J ureteral stent placement for a ureteral leak occurring 10 cm beyond the UP junction. A urinoma drain was also placed. Once the urinoma drain ceases, it can be removed. Nephrostomy catheter can also be removed at that time.   Electronically Signed   By: Maryclare Bean M.D.   On: 03/04/2013 12:35   Ir Perc Nephrostomy Right  03/04/2013   CLINICAL DATA:  Partial right nephrectomy with urinoma  EXAM: ABSCESS DRAINAGE; PERC NEPHROSTOMY*R*; IR RIGHT PERC INTRO URET CATH; RIGHT NEPHROSTOGRAM; IR ULTRASOUND GUIDANCE TISSUE ABLATION  MEDICATIONS AND MEDICAL HISTORY: Versed 6 mg, Fentanyl 200 mcg.  Additional Medications: Rocephin 1 g.  ANESTHESIA/SEDATION: Moderate sedation time: 50 minutes  CONTRAST:  20 cc Omnipaque 300  FLUOROSCOPY TIME:  12 min and 42 seconds.  PROCEDURE: The procedure, risks, benefits, and alternatives were explained to the patient. Questions regarding the procedure were encouraged and answered. The patient understands and consents to the procedure.  The back was prepped with Betadine in a sterile fashion, and a sterile drape was applied covering the operative field. A sterile gown and sterile gloves were used for the procedure.  Under sonographic guidance, a 22 gauge Chiba needle was inserted into the urinoma. Yellowish clear fluid was aspirated. The needle was removed over a 018 wire which was up sized to a 3 J. a 10 [place] dilator followed by a 10 [place] drain were inserted. This was looped and string fixed and sewn to the skin.  A Chiba needle was then inserted into the renal pelvis under fluoroscopic guidance. Contrast and air were injected after aspirating urine. A 21 gauge needle was then advanced into a gas-filled posterior interpolar region calyx over the 12th rib and removed over a 018 wire which was up sized to a Bentson. A copy catheter was advanced over the Bentson wire to the proximal ureter and contrast was injected demonstrating the ureteral leak from the  proximal ureter into the urinoma.  The Kumpe the catheter was carefully advanced over a glidewire across the ureteral defect, into the distal ureter, and finally into the bladder. Contrast was injected opacifying the bladder. The wire was removed over the 3 J.  A 26 cm 8.5 French double-J ureteral stent was advanced over the wire. It was coiled in the bladder and renal pelvis. The string was removed without complication.  A nephrostomy was then advanced over the 3 J and coiled in the renal pelvis. It was looped and string fixed. It was sewn to the skin.  COMPLICATIONS: None  FINDINGS: Imaging demonstrates 13 French drain placement into the urinoma.  Subsequent imaging demonstrates opacifying the renal collecting system with contrast and gas via the renal pelvis.  Next series of images demonstrate access into and interpolar region posterior gas-filled interpolar region calyx.  The next image demonstrates a nephrostogram via  a copy catheter in the proximal ureter. This does demonstrate the ureteral leak into the urinoma has while as ureteral obstruction just beyond the ureteral defect. The defect occurs approximately 10 cm beyond the ureteropelvic junction.  The next series of images demonstrate placement of a double-J ureteral stent extending from the right renal pelvis to the bladder as well as placement of a 10 French nephrostomy coiled in the renal pelvis.  IMPRESSION: Successful percutaneous nephrostomy catheter placement and double-J ureteral stent placement for a ureteral leak occurring 10 cm beyond the UP junction. A urinoma drain was also placed. Once the urinoma drain ceases, it can be removed. Nephrostomy catheter can also be removed at that time.   Electronically Signed   By: Maryclare Bean M.D.   On: 03/04/2013 12:35   Ir US Guide Bx Asp/drain  03/04/2013   CLINICAL DATA:  Partial right nephrectomy with urinoma  EXAM: ABSCESS DRAINAGE; PERC NEPHROSTOMY*R*; IR RIGHT PERC INTRO URET CATH; RIGHT NEPHROSTOGRAM;  IR ULTRASOUND GUIDANCE TISSUE ABLATION  MEDICATIONS AND MEDICAL HISTORY: Versed 6 mg, Fentanyl 200 mcg.  Additional Medications: Rocephin 1 g.  ANESTHESIA/SEDATION: Moderate sedation time: 50 minutes  CONTRAST:  20 cc Omnipaque 300  FLUOROSCOPY TIME:  12 min and 42 seconds.  PROCEDURE: The procedure, risks, benefits, and alternatives were explained to the patient. Questions regarding the procedure were encouraged and answered. The patient understands and consents to the procedure.  The back was prepped with Betadine in a sterile fashion, and a sterile drape was applied covering the operative field. A sterile gown and sterile gloves were used for the procedure.  Under sonographic guidance, a 22 gauge Chiba needle was inserted into the urinoma. Yellowish clear fluid was aspirated. The needle was removed over a 018 wire which was up sized to a 3 J. a 10 [place] dilator followed by a 10 [place] drain were inserted. This was looped and string fixed and sewn to the skin.  A Chiba needle was then inserted into the renal pelvis under fluoroscopic guidance. Contrast and air were injected after aspirating urine. A 21 gauge needle was then advanced into a gas-filled posterior interpolar region calyx over the 12th rib and removed over a 018 wire which was up sized to a Bentson. A copy catheter was advanced over the Bentson wire to the proximal ureter and contrast was injected demonstrating the ureteral leak from the proximal ureter into the urinoma.  The Kumpe the catheter was carefully advanced over a glidewire across the ureteral defect, into the distal ureter, and finally into the bladder. Contrast was injected opacifying the bladder. The wire was removed over the 3 J.  A 26 cm 8.5 French double-J ureteral stent was advanced over the wire. It was coiled in the bladder and renal pelvis. The string was removed without complication.  A nephrostomy was then advanced over the 3 J and coiled in the renal pelvis. It was looped and  string fixed. It was sewn to the skin.  COMPLICATIONS: None  FINDINGS: Imaging demonstrates 54 French drain placement into the urinoma.  Subsequent imaging demonstrates opacifying the renal collecting system with contrast and gas via the renal pelvis.  Next series of images demonstrate access into and interpolar region posterior gas-filled interpolar region calyx.  The next image demonstrates a nephrostogram via a copy catheter in the proximal ureter. This does demonstrate the ureteral leak into the urinoma has while as ureteral obstruction just beyond the ureteral defect. The defect occurs approximately 10 cm beyond the ureteropelvic  junction.  The next series of images demonstrate placement of a double-J ureteral stent extending from the right renal pelvis to the bladder as well as placement of a 10 French nephrostomy coiled in the renal pelvis.  IMPRESSION: Successful percutaneous nephrostomy catheter placement and double-J ureteral stent placement for a ureteral leak occurring 10 cm beyond the UP junction. A urinoma drain was also placed. Once the urinoma drain ceases, it can be removed. Nephrostomy catheter can also be removed at that time.   Electronically Signed   By: Maryclare Bean M.D.   On: 03/04/2013 12:35   Ir US Guide Bx Asp/drain  03/04/2013   CLINICAL DATA:  Partial right nephrectomy with urinoma  EXAM: ABSCESS DRAINAGE; PERC NEPHROSTOMY*R*; IR RIGHT PERC INTRO URET CATH; RIGHT NEPHROSTOGRAM; IR ULTRASOUND GUIDANCE TISSUE ABLATION  MEDICATIONS AND MEDICAL HISTORY: Versed 6 mg, Fentanyl 200 mcg.  Additional Medications: Rocephin 1 g.  ANESTHESIA/SEDATION: Moderate sedation time: 50 minutes  CONTRAST:  20 cc Omnipaque 300  FLUOROSCOPY TIME:  12 min and 42 seconds.  PROCEDURE: The procedure, risks, benefits, and alternatives were explained to the patient. Questions regarding the procedure were encouraged and answered. The patient understands and consents to the procedure.  The back was prepped with Betadine  in a sterile fashion, and a sterile drape was applied covering the operative field. A sterile gown and sterile gloves were used for the procedure.  Under sonographic guidance, a 22 gauge Chiba needle was inserted into the urinoma. Yellowish clear fluid was aspirated. The needle was removed over a 018 wire which was up sized to a 3 J. a 10 [place] dilator followed by a 10 [place] drain were inserted. This was looped and string fixed and sewn to the skin.  A Chiba needle was then inserted into the renal pelvis under fluoroscopic guidance. Contrast and air were injected after aspirating urine. A 21 gauge needle was then advanced into a gas-filled posterior interpolar region calyx over the 12th rib and removed over a 018 wire which was up sized to a Bentson. A copy catheter was advanced over the Bentson wire to the proximal ureter and contrast was injected demonstrating the ureteral leak from the proximal ureter into the urinoma.  The Kumpe the catheter was carefully advanced over a glidewire across the ureteral defect, into the distal ureter, and finally into the bladder. Contrast was injected opacifying the bladder. The wire was removed over the 3 J.  A 26 cm 8.5 French double-J ureteral stent was advanced over the wire. It was coiled in the bladder and renal pelvis. The string was removed without complication.  A nephrostomy was then advanced over the 3 J and coiled in the renal pelvis. It was looped and string fixed. It was sewn to the skin.  COMPLICATIONS: None  FINDINGS: Imaging demonstrates 52 French drain placement into the urinoma.  Subsequent imaging demonstrates opacifying the renal collecting system with contrast and gas via the renal pelvis.  Next series of images demonstrate access into and interpolar region posterior gas-filled interpolar region calyx.  The next image demonstrates a nephrostogram via a copy catheter in the proximal ureter. This does demonstrate the ureteral leak into the urinoma has while  as ureteral obstruction just beyond the ureteral defect. The defect occurs approximately 10 cm beyond the ureteropelvic junction.  The next series of images demonstrate placement of a double-J ureteral stent extending from the right renal pelvis to the bladder as well as placement of a 10 French nephrostomy coiled in the  renal pelvis.  IMPRESSION: Successful percutaneous nephrostomy catheter placement and double-J ureteral stent placement for a ureteral leak occurring 10 cm beyond the UP junction. A urinoma drain was also placed. Once the urinoma drain ceases, it can be removed. Nephrostomy catheter can also be removed at that time.   Electronically Signed   By: Maryclare Bean M.D.   On: 03/04/2013 12:35   Ir [employer]is Drone Cath Perc Right  03/04/2013   CLINICAL DATA:  Partial right nephrectomy with urinoma  EXAM: ABSCESS DRAINAGE; PERC NEPHROSTOMY*R*; IR RIGHT PERC INTRO URET CATH; RIGHT NEPHROSTOGRAM; IR ULTRASOUND GUIDANCE TISSUE ABLATION  MEDICATIONS AND MEDICAL HISTORY: Versed 6 mg, Fentanyl 200 mcg.  Additional Medications: Rocephin 1 g.  ANESTHESIA/SEDATION: Moderate sedation time: 50 minutes  CONTRAST:  20 cc Omnipaque 300  FLUOROSCOPY TIME:  12 min and 42 seconds.  PROCEDURE: The procedure, risks, benefits, and alternatives were explained to the patient. Questions regarding the procedure were encouraged and answered. The patient understands and consents to the procedure.  The back was prepped with Betadine in a sterile fashion, and a sterile drape was applied covering the operative field. A sterile gown and sterile gloves were used for the procedure.  Under sonographic guidance, a 22 gauge Chiba needle was inserted into the urinoma. Yellowish clear fluid was aspirated. The needle was removed over a 018 wire which was up sized to a 3 J. a 10 [place] dilator followed by a 10 [place] drain were inserted. This was looped and string fixed and sewn to the skin.  A Chiba needle was then inserted into the renal pelvis under  fluoroscopic guidance. Contrast and air were injected after aspirating urine. A 21 gauge needle was then advanced into a gas-filled posterior interpolar region calyx over the 12th rib and removed over a 018 wire which was up sized to a Bentson. A copy catheter was advanced over the Bentson wire to the proximal ureter and contrast was injected demonstrating the ureteral leak from the proximal ureter into the urinoma.  The Kumpe the catheter was carefully advanced over a glidewire across the ureteral defect, into the distal ureter, and finally into the bladder. Contrast was injected opacifying the bladder. The wire was removed over the 3 J.  A 26 cm 8.5 French double-J ureteral stent was advanced over the wire. It was coiled in the bladder and renal pelvis. The string was removed without complication.  A nephrostomy was then advanced over the 3 J and coiled in the renal pelvis. It was looped and string fixed. It was sewn to the skin.  COMPLICATIONS: None  FINDINGS: Imaging demonstrates 65 French drain placement into the urinoma.  Subsequent imaging demonstrates opacifying the renal collecting system with contrast and gas via the renal pelvis.  Next series of images demonstrate access into and interpolar region posterior gas-filled interpolar region calyx.  The next image demonstrates a nephrostogram via a copy catheter in the proximal ureter. This does demonstrate the ureteral leak into the urinoma has while as ureteral obstruction just beyond the ureteral defect. The defect occurs approximately 10 cm beyond the ureteropelvic junction.  The next series of images demonstrate placement of a double-J ureteral stent extending from the right renal pelvis to the bladder as well as placement of a 10 French nephrostomy coiled in the renal pelvis.  IMPRESSION: Successful percutaneous nephrostomy catheter placement and double-J ureteral stent placement for a ureteral leak occurring 10 cm beyond the UP junction. A urinoma drain  was also placed. Once the urinoma  drain ceases, it can be removed. Nephrostomy catheter can also be removed at that time.   Electronically Signed   By: Maryclare Bean M.D.   On: 03/04/2013 12:35    Anti-infectives: Anti-infectives   Start     Dose/Rate Route Frequency Ordered Stop   03/04/13 1000  cefTRIAXone (ROCEPHIN) 1 g in dextrose 5 % 50 mL IVPB     1 g 100 mL/hr over 30 Minutes Intravenous Every 24 hours 03/03/13 1436     03/03/13 1030  cefTRIAXone (ROCEPHIN) 1 g in dextrose 5 % 50 mL IVPB     1 g 100 mL/hr over 30 Minutes Intravenous  Once 03/03/13 1020 03/03/13 1138      Assessment/Plan: s/p right PCN,JJ stent, urinoma drainage 1/5; cont current tx, follow labs, check urine cx; other plans as per urology/IM  LOS: 2 days    Latrease Kunde,D [facility] 03/05/2013

## 2013-03-06 ENCOUNTER — Telehealth: Payer: Self-pay | Admitting: Internal Medicine

## 2013-03-06 DIAGNOSIS — D3000 Benign neoplasm of unspecified kidney: Secondary | ICD-10-CM

## 2013-03-06 LAB — BASIC METABOLIC PANEL
BUN: 12 mg/dL (ref 6–23)
CALCIUM: 9.5 mg/dL (ref 8.4–10.5)
CO2: 21 mEq/L (ref 19–32)
CREATININE: 0.84 mg/dL (ref 0.50–1.35)
Chloride: 108 mEq/L (ref 96–112)
GFR, EST NON AFRICAN AMERICAN: 86 mL/min — AB (ref >90)
Glucose, Bld: 134 mg/dL — ABNORMAL HIGH (ref 70–99)
Potassium: 3.9 mEq/L (ref 3.7–5.3)
SODIUM: 140 meq/L (ref 137–147)

## 2013-03-06 MED ORDER — CIPROFLOXACIN HCL 500 MG PO TABS: 500.0000 mg | ORAL_TABLET | Freq: Two times a day (BID) | ORAL | Status: DC

## 2013-03-06 MED ORDER — CIPROFLOXACIN HCL 500 MG PO TABS
500.0000 mg | ORAL_TABLET | Freq: Two times a day (BID) | ORAL | Status: DC
  Filled 2013-03-06 (×3): qty 1

## 2013-03-06 NOTE — Progress Notes (Signed)
Subjective: Pt feels fairly good this am. Sitting up eating breakfast. Denies much pain.  Objective: Physical Exam: BP 125/62  Pulse 82  Temp(Src) 98.3 F (36.8 C) (Oral)  Resp 16  Ht 6' (1.829 m)  Wt 206 lb 8 oz (93.668 kg)  BMI 28.00 kg/m2  SpO2 96% PCN intact-site clean, NT. Clear UOP, >2L recorded Urinoma drain intact, NT. Scant output, small amount blood stranding. Only 30mL recorded yesterday.    Labs: CBC  Recent Labs  03/04/13 0515 03/05/13 0500  WBC 11.8* 7.8  HGB 11.0* 9.9*  HCT 33.2* 29.6*  PLT 248 273   BMET  Recent Labs  03/04/13 0515 03/05/13 0500  NA 138 139  K 4.4 4.1  CL 105 106  CO2 20 21  GLUCOSE 99 98  BUN 24* 18  CREATININE 1.91* 1.11  CALCIUM 9.9 9.9   LFT No results found for this basename: PROT, ALBUMIN, AST, ALT, ALKPHOS, BILITOT, BILIDIR, IBILI, LIPASE,  in the last 72 hours PT/INR  Recent Labs  03/03/13 1512  LABPROT 15.5*  INR 1.26     Studies/Results: Ir Guided Drain W Catheter Placement  03/04/2013   CLINICAL DATA:  Partial right nephrectomy with urinoma  EXAM: ABSCESS DRAINAGE; PERC NEPHROSTOMY*R*; IR RIGHT PERC INTRO URET CATH; RIGHT NEPHROSTOGRAM; IR ULTRASOUND GUIDANCE TISSUE ABLATION  MEDICATIONS AND MEDICAL HISTORY: Versed 6 mg, Fentanyl 200 mcg.  Additional Medications: Rocephin 1 g.  ANESTHESIA/SEDATION: Moderate sedation time: 50 minutes  CONTRAST:  20 cc Omnipaque 300  FLUOROSCOPY TIME:  12 min and 42 seconds.  PROCEDURE: The procedure, risks, benefits, and alternatives were explained to the patient. Questions regarding the procedure were encouraged and answered. The patient understands and consents to the procedure.  The back was prepped with Betadine in a sterile fashion, and a sterile drape was applied covering the operative field. A sterile gown and sterile gloves were used for the procedure.  Under sonographic guidance, a 22 gauge Chiba needle was inserted into the urinoma. Yellowish clear fluid was aspirated.  The needle was removed over a 018 wire which was up sized to a 3 J. a 10 [place] dilator followed by a 10 [place] drain were inserted. This was looped and string fixed and sewn to the skin.  A Chiba needle was then inserted into the renal pelvis under fluoroscopic guidance. Contrast and air were injected after aspirating urine. A 21 gauge needle was then advanced into a gas-filled posterior interpolar region calyx over the 12th rib and removed over a 018 wire which was up sized to a Bentson. A copy catheter was advanced over the Bentson wire to the proximal ureter and contrast was injected demonstrating the ureteral leak from the proximal ureter into the urinoma.  The Kumpe the catheter was carefully advanced over a glidewire across the ureteral defect, into the distal ureter, and finally into the bladder. Contrast was injected opacifying the bladder. The wire was removed over the 3 J.  A 26 cm 8.5 French double-J ureteral stent was advanced over the wire. It was coiled in the bladder and renal pelvis. The string was removed without complication.  A nephrostomy was then advanced over the 3 J and coiled in the renal pelvis. It was looped and string fixed. It was sewn to the skin.  COMPLICATIONS: None  FINDINGS: Imaging demonstrates 21 French drain placement into the urinoma.  Subsequent imaging demonstrates opacifying the renal collecting system with contrast and gas via the renal pelvis.  Next series of images demonstrate access  into and interpolar region posterior gas-filled interpolar region calyx.  The next image demonstrates a nephrostogram via a copy catheter in the proximal ureter. This does demonstrate the ureteral leak into the urinoma has while as ureteral obstruction just beyond the ureteral defect. The defect occurs approximately 10 cm beyond the ureteropelvic junction.  The next series of images demonstrate placement of a double-J ureteral stent extending from the right renal pelvis to the bladder as well  as placement of a 10 French nephrostomy coiled in the renal pelvis.  IMPRESSION: Successful percutaneous nephrostomy catheter placement and double-J ureteral stent placement for a ureteral leak occurring 10 cm beyond the UP junction. A urinoma drain was also placed. Once the urinoma drain ceases, it can be removed. Nephrostomy catheter can also be removed at that time.   Electronically Signed   By: Maryclare Bean M.D.   On: 03/04/2013 12:35   Ir Nephrostogram Right  03/04/2013   CLINICAL DATA:  Partial right nephrectomy with urinoma  EXAM: ABSCESS DRAINAGE; PERC NEPHROSTOMY*R*; IR RIGHT PERC INTRO URET CATH; RIGHT NEPHROSTOGRAM; IR ULTRASOUND GUIDANCE TISSUE ABLATION  MEDICATIONS AND MEDICAL HISTORY: Versed 6 mg, Fentanyl 200 mcg.  Additional Medications: Rocephin 1 g.  ANESTHESIA/SEDATION: Moderate sedation time: 50 minutes  CONTRAST:  20 cc Omnipaque 300  FLUOROSCOPY TIME:  12 min and 42 seconds.  PROCEDURE: The procedure, risks, benefits, and alternatives were explained to the patient. Questions regarding the procedure were encouraged and answered. The patient understands and consents to the procedure.  The back was prepped with Betadine in a sterile fashion, and a sterile drape was applied covering the operative field. A sterile gown and sterile gloves were used for the procedure.  Under sonographic guidance, a 22 gauge Chiba needle was inserted into the urinoma. Yellowish clear fluid was aspirated. The needle was removed over a 018 wire which was up sized to a 3 J. a 10 [place] dilator followed by a 10 [place] drain were inserted. This was looped and string fixed and sewn to the skin.  A Chiba needle was then inserted into the renal pelvis under fluoroscopic guidance. Contrast and air were injected after aspirating urine. A 21 gauge needle was then advanced into a gas-filled posterior interpolar region calyx over the 12th rib and removed over a 018 wire which was up sized to a Bentson. A copy catheter was advanced  over the Bentson wire to the proximal ureter and contrast was injected demonstrating the ureteral leak from the proximal ureter into the urinoma.  The Kumpe the catheter was carefully advanced over a glidewire across the ureteral defect, into the distal ureter, and finally into the bladder. Contrast was injected opacifying the bladder. The wire was removed over the 3 J.  A 26 cm 8.5 French double-J ureteral stent was advanced over the wire. It was coiled in the bladder and renal pelvis. The string was removed without complication.  A nephrostomy was then advanced over the 3 J and coiled in the renal pelvis. It was looped and string fixed. It was sewn to the skin.  COMPLICATIONS: None  FINDINGS: Imaging demonstrates 34 French drain placement into the urinoma.  Subsequent imaging demonstrates opacifying the renal collecting system with contrast and gas via the renal pelvis.  Next series of images demonstrate access into and interpolar region posterior gas-filled interpolar region calyx.  The next image demonstrates a nephrostogram via a copy catheter in the proximal ureter. This does demonstrate the ureteral leak into the urinoma has while as ureteral  obstruction just beyond the ureteral defect. The defect occurs approximately 10 cm beyond the ureteropelvic junction.  The next series of images demonstrate placement of a double-J ureteral stent extending from the right renal pelvis to the bladder as well as placement of a 10 French nephrostomy coiled in the renal pelvis.  IMPRESSION: Successful percutaneous nephrostomy catheter placement and double-J ureteral stent placement for a ureteral leak occurring 10 cm beyond the UP junction. A urinoma drain was also placed. Once the urinoma drain ceases, it can be removed. Nephrostomy catheter can also be removed at that time.   Electronically Signed   By: Maryclare Bean M.D.   On: 03/04/2013 12:35   Ir Perc Nephrostomy Right  03/04/2013   CLINICAL DATA:  Partial right  nephrectomy with urinoma  EXAM: ABSCESS DRAINAGE; PERC NEPHROSTOMY*R*; IR RIGHT PERC INTRO URET CATH; RIGHT NEPHROSTOGRAM; IR ULTRASOUND GUIDANCE TISSUE ABLATION  MEDICATIONS AND MEDICAL HISTORY: Versed 6 mg, Fentanyl 200 mcg.  Additional Medications: Rocephin 1 g.  ANESTHESIA/SEDATION: Moderate sedation time: 50 minutes  CONTRAST:  20 cc Omnipaque 300  FLUOROSCOPY TIME:  12 min and 42 seconds.  PROCEDURE: The procedure, risks, benefits, and alternatives were explained to the patient. Questions regarding the procedure were encouraged and answered. The patient understands and consents to the procedure.  The back was prepped with Betadine in a sterile fashion, and a sterile drape was applied covering the operative field. A sterile gown and sterile gloves were used for the procedure.  Under sonographic guidance, a 22 gauge Chiba needle was inserted into the urinoma. Yellowish clear fluid was aspirated. The needle was removed over a 018 wire which was up sized to a 3 J. a 10 [place] dilator followed by a 10 [place] drain were inserted. This was looped and string fixed and sewn to the skin.  A Chiba needle was then inserted into the renal pelvis under fluoroscopic guidance. Contrast and air were injected after aspirating urine. A 21 gauge needle was then advanced into a gas-filled posterior interpolar region calyx over the 12th rib and removed over a 018 wire which was up sized to a Bentson. A copy catheter was advanced over the Bentson wire to the proximal ureter and contrast was injected demonstrating the ureteral leak from the proximal ureter into the urinoma.  The Kumpe the catheter was carefully advanced over a glidewire across the ureteral defect, into the distal ureter, and finally into the bladder. Contrast was injected opacifying the bladder. The wire was removed over the 3 J.  A 26 cm 8.5 French double-J ureteral stent was advanced over the wire. It was coiled in the bladder and renal pelvis. The string was  removed without complication.  A nephrostomy was then advanced over the 3 J and coiled in the renal pelvis. It was looped and string fixed. It was sewn to the skin.  COMPLICATIONS: None  FINDINGS: Imaging demonstrates 74 French drain placement into the urinoma.  Subsequent imaging demonstrates opacifying the renal collecting system with contrast and gas via the renal pelvis.  Next series of images demonstrate access into and interpolar region posterior gas-filled interpolar region calyx.  The next image demonstrates a nephrostogram via a copy catheter in the proximal ureter. This does demonstrate the ureteral leak into the urinoma has while as ureteral obstruction just beyond the ureteral defect. The defect occurs approximately 10 cm beyond the ureteropelvic junction.  The next series of images demonstrate placement of a double-J ureteral stent extending from the right renal pelvis to  the bladder as well as placement of a 10 French nephrostomy coiled in the renal pelvis.  IMPRESSION: Successful percutaneous nephrostomy catheter placement and double-J ureteral stent placement for a ureteral leak occurring 10 cm beyond the UP junction. A urinoma drain was also placed. Once the urinoma drain ceases, it can be removed. Nephrostomy catheter can also be removed at that time.   Electronically Signed   By: Maryclare Bean M.D.   On: 03/04/2013 12:35   Ir US Guide Bx Asp/drain  03/04/2013   CLINICAL DATA:  Partial right nephrectomy with urinoma  EXAM: ABSCESS DRAINAGE; PERC NEPHROSTOMY*R*; IR RIGHT PERC INTRO URET CATH; RIGHT NEPHROSTOGRAM; IR ULTRASOUND GUIDANCE TISSUE ABLATION  MEDICATIONS AND MEDICAL HISTORY: Versed 6 mg, Fentanyl 200 mcg.  Additional Medications: Rocephin 1 g.  ANESTHESIA/SEDATION: Moderate sedation time: 50 minutes  CONTRAST:  20 cc Omnipaque 300  FLUOROSCOPY TIME:  12 min and 42 seconds.  PROCEDURE: The procedure, risks, benefits, and alternatives were explained to the patient. Questions regarding the  procedure were encouraged and answered. The patient understands and consents to the procedure.  The back was prepped with Betadine in a sterile fashion, and a sterile drape was applied covering the operative field. A sterile gown and sterile gloves were used for the procedure.  Under sonographic guidance, a 22 gauge Chiba needle was inserted into the urinoma. Yellowish clear fluid was aspirated. The needle was removed over a 018 wire which was up sized to a 3 J. a 10 [place] dilator followed by a 10 [place] drain were inserted. This was looped and string fixed and sewn to the skin.  A Chiba needle was then inserted into the renal pelvis under fluoroscopic guidance. Contrast and air were injected after aspirating urine. A 21 gauge needle was then advanced into a gas-filled posterior interpolar region calyx over the 12th rib and removed over a 018 wire which was up sized to a Bentson. A copy catheter was advanced over the Bentson wire to the proximal ureter and contrast was injected demonstrating the ureteral leak from the proximal ureter into the urinoma.  The Kumpe the catheter was carefully advanced over a glidewire across the ureteral defect, into the distal ureter, and finally into the bladder. Contrast was injected opacifying the bladder. The wire was removed over the 3 J.  A 26 cm 8.5 French double-J ureteral stent was advanced over the wire. It was coiled in the bladder and renal pelvis. The string was removed without complication.  A nephrostomy was then advanced over the 3 J and coiled in the renal pelvis. It was looped and string fixed. It was sewn to the skin.  COMPLICATIONS: None  FINDINGS: Imaging demonstrates 44 French drain placement into the urinoma.  Subsequent imaging demonstrates opacifying the renal collecting system with contrast and gas via the renal pelvis.  Next series of images demonstrate access into and interpolar region posterior gas-filled interpolar region calyx.  The next image  demonstrates a nephrostogram via a copy catheter in the proximal ureter. This does demonstrate the ureteral leak into the urinoma has while as ureteral obstruction just beyond the ureteral defect. The defect occurs approximately 10 cm beyond the ureteropelvic junction.  The next series of images demonstrate placement of a double-J ureteral stent extending from the right renal pelvis to the bladder as well as placement of a 10 French nephrostomy coiled in the renal pelvis.  IMPRESSION: Successful percutaneous nephrostomy catheter placement and double-J ureteral stent placement for a ureteral leak occurring 10 cm  beyond the UP junction. A urinoma drain was also placed. Once the urinoma drain ceases, it can be removed. Nephrostomy catheter can also be removed at that time.   Electronically Signed   By: Maryclare Bean M.D.   On: 03/04/2013 12:35   Ir US Guide Bx Asp/drain  03/04/2013   CLINICAL DATA:  Partial right nephrectomy with urinoma  EXAM: ABSCESS DRAINAGE; PERC NEPHROSTOMY*R*; IR RIGHT PERC INTRO URET CATH; RIGHT NEPHROSTOGRAM; IR ULTRASOUND GUIDANCE TISSUE ABLATION  MEDICATIONS AND MEDICAL HISTORY: Versed 6 mg, Fentanyl 200 mcg.  Additional Medications: Rocephin 1 g.  ANESTHESIA/SEDATION: Moderate sedation time: 50 minutes  CONTRAST:  20 cc Omnipaque 300  FLUOROSCOPY TIME:  12 min and 42 seconds.  PROCEDURE: The procedure, risks, benefits, and alternatives were explained to the patient. Questions regarding the procedure were encouraged and answered. The patient understands and consents to the procedure.  The back was prepped with Betadine in a sterile fashion, and a sterile drape was applied covering the operative field. A sterile gown and sterile gloves were used for the procedure.  Under sonographic guidance, a 22 gauge Chiba needle was inserted into the urinoma. Yellowish clear fluid was aspirated. The needle was removed over a 018 wire which was up sized to a 3 J. a 10 [place] dilator followed by a 10 [place]  drain were inserted. This was looped and string fixed and sewn to the skin.  A Chiba needle was then inserted into the renal pelvis under fluoroscopic guidance. Contrast and air were injected after aspirating urine. A 21 gauge needle was then advanced into a gas-filled posterior interpolar region calyx over the 12th rib and removed over a 018 wire which was up sized to a Bentson. A copy catheter was advanced over the Bentson wire to the proximal ureter and contrast was injected demonstrating the ureteral leak from the proximal ureter into the urinoma.  The Kumpe the catheter was carefully advanced over a glidewire across the ureteral defect, into the distal ureter, and finally into the bladder. Contrast was injected opacifying the bladder. The wire was removed over the 3 J.  A 26 cm 8.5 French double-J ureteral stent was advanced over the wire. It was coiled in the bladder and renal pelvis. The string was removed without complication.  A nephrostomy was then advanced over the 3 J and coiled in the renal pelvis. It was looped and string fixed. It was sewn to the skin.  COMPLICATIONS: None  FINDINGS: Imaging demonstrates 5 French drain placement into the urinoma.  Subsequent imaging demonstrates opacifying the renal collecting system with contrast and gas via the renal pelvis.  Next series of images demonstrate access into and interpolar region posterior gas-filled interpolar region calyx.  The next image demonstrates a nephrostogram via a copy catheter in the proximal ureter. This does demonstrate the ureteral leak into the urinoma has while as ureteral obstruction just beyond the ureteral defect. The defect occurs approximately 10 cm beyond the ureteropelvic junction.  The next series of images demonstrate placement of a double-J ureteral stent extending from the right renal pelvis to the bladder as well as placement of a 10 French nephrostomy coiled in the renal pelvis.  IMPRESSION: Successful percutaneous  nephrostomy catheter placement and double-J ureteral stent placement for a ureteral leak occurring 10 cm beyond the UP junction. A urinoma drain was also placed. Once the urinoma drain ceases, it can be removed. Nephrostomy catheter can also be removed at that time.   Electronically Signed   By:  Art  Hoss M.D.   On: 03/04/2013 12:35   Ir Oris Drone Cath Perc Right  03/04/2013   CLINICAL DATA:  Partial right nephrectomy with urinoma  EXAM: ABSCESS DRAINAGE; PERC NEPHROSTOMY*R*; IR RIGHT PERC INTRO URET CATH; RIGHT NEPHROSTOGRAM; IR ULTRASOUND GUIDANCE TISSUE ABLATION  MEDICATIONS AND MEDICAL HISTORY: Versed 6 mg, Fentanyl 200 mcg.  Additional Medications: Rocephin 1 g.  ANESTHESIA/SEDATION: Moderate sedation time: 50 minutes  CONTRAST:  20 cc Omnipaque 300  FLUOROSCOPY TIME:  12 min and 42 seconds.  PROCEDURE: The procedure, risks, benefits, and alternatives were explained to the patient. Questions regarding the procedure were encouraged and answered. The patient understands and consents to the procedure.  The back was prepped with Betadine in a sterile fashion, and a sterile drape was applied covering the operative field. A sterile gown and sterile gloves were used for the procedure.  Under sonographic guidance, a 22 gauge Chiba needle was inserted into the urinoma. Yellowish clear fluid was aspirated. The needle was removed over a 018 wire which was up sized to a 3 J. a 10 [place] dilator followed by a 10 [place] drain were inserted. This was looped and string fixed and sewn to the skin.  A Chiba needle was then inserted into the renal pelvis under fluoroscopic guidance. Contrast and air were injected after aspirating urine. A 21 gauge needle was then advanced into a gas-filled posterior interpolar region calyx over the 12th rib and removed over a 018 wire which was up sized to a Bentson. A copy catheter was advanced over the Bentson wire to the proximal ureter and contrast was injected demonstrating the ureteral  leak from the proximal ureter into the urinoma.  The Kumpe the catheter was carefully advanced over a glidewire across the ureteral defect, into the distal ureter, and finally into the bladder. Contrast was injected opacifying the bladder. The wire was removed over the 3 J.  A 26 cm 8.5 French double-J ureteral stent was advanced over the wire. It was coiled in the bladder and renal pelvis. The string was removed without complication.  A nephrostomy was then advanced over the 3 J and coiled in the renal pelvis. It was looped and string fixed. It was sewn to the skin.  COMPLICATIONS: None  FINDINGS: Imaging demonstrates 42 French drain placement into the urinoma.  Subsequent imaging demonstrates opacifying the renal collecting system with contrast and gas via the renal pelvis.  Next series of images demonstrate access into and interpolar region posterior gas-filled interpolar region calyx.  The next image demonstrates a nephrostogram via a copy catheter in the proximal ureter. This does demonstrate the ureteral leak into the urinoma has while as ureteral obstruction just beyond the ureteral defect. The defect occurs approximately 10 cm beyond the ureteropelvic junction.  The next series of images demonstrate placement of a double-J ureteral stent extending from the right renal pelvis to the bladder as well as placement of a 10 French nephrostomy coiled in the renal pelvis.  IMPRESSION: Successful percutaneous nephrostomy catheter placement and double-J ureteral stent placement for a ureteral leak occurring 10 cm beyond the UP junction. A urinoma drain was also placed. Once the urinoma drain ceases, it can be removed. Nephrostomy catheter can also be removed at that time.   Electronically Signed   By: Maryclare Bean M.D.   On: 03/04/2013 12:35    Assessment/Plan: S/p (R)PCN and urteral stent placement. Working well, good UOP S/p urinoma drain-output significantly decreased since placement. Cx negative. Cont flushes  as ordered Other plans per Urology   LOS: 3 days    Ascencion Dike PA-C 03/06/2013 8:20 AM

## 2013-03-06 NOTE — Telephone Encounter (Signed)
Pt had an appointment with Dr. Marin Olp and his spouse called to cancel the appointment.  At this time pt has not been re-scheduled.

## 2013-03-06 NOTE — Progress Notes (Signed)
Subjective:   1- Moderate Risk Prostate Cancer - s/p radical prostatectomy 01/2013 for [id number] Gleason 3+4=7 adenocarcinoma, margins negative. Was previously on surveillance for low-risk disease that had progressed by protocol-biopsy.   2 - Right Renal Oncocytoma - s/p right robotic partial nephrectomy 01/2013 for 3.5cm lower pole enhancing mass, path confirmed benign oncocytoma.   3 - Rt Retroperitoneal Urinoma / Ureteral Leak - Pt admitted 1/4 and found to have right retroperitoneal urinoma and elevated Cr worrisome for urine leak from recent partial nephrectomy. He had minimal drain output and normal Cr when discharged postop. IR placed urinoma drain 1/5 and also rt nephrostomy + JJ ureteral stent, found to have extravasation from mid ureter (not at level of prior renorrhaphy). Pt now with improved symptoms, minimal urinoma drain output, and high (as expected) nephrostomy output.   Today David [name] is seen in f/u above. He is ambulatory, pain controlled, and all CX's negative and final.  Objective: Vital signs in last 24 hours: Temp:  [97.7 F (36.5 C)-98.6 F (37 C)] 98.3 F (36.8 C) (01/07 0428) Pulse Rate:  [73-86] 82 (01/07 0428) Resp:  [16-20] 16 (01/07 0428) BP: (111-129)/(50-62) 125/62 mmHg (01/07 0428) SpO2:  [96 %-97 %] 96 % (01/07 0428) Weight:  [93.668 kg (206 lb 8 oz)] 93.668 kg (206 lb 8 oz) (01/07 0428) Last BM Date: 03/04/13  Intake/Output from previous day: 01/06 0701 - 01/07 0700 In: 2348 [P.O.:483; I.V.:1800; IV Piggyback:50] Out: 2580 [Urine:2580] Intake/Output this shift:    General appearance: alert, cooperative, appears stated age and familiy at bedside Head: Normocephalic, without obvious abnormality, atraumatic Eyes: conjunctivae/corneas clear. PERRL, EOM's intact. Fundi benign. Ears: normal TM's and external ear canals both ears Nose: Nares normal. Septum midline. Mucosa normal. No drainage or sinus tenderness. Throat: lips, mucosa, and tongue normal;  teeth and gums normal Neck: no adenopathy, no carotid bruit, no JVD, supple, symmetrical, trachea midline and thyroid not enlarged, symmetric, no tenderness/mass/nodules Back: symmetric, no curvature. ROM normal. No CVA tenderness. Resp: clear to auscultation bilaterally Chest wall: no tenderness Cardio: regular rate and rhythm, S1, S2 normal, no murmur, click, rub or gallop GI: soft, non-tender; bowel sounds normal; no masses,  no organomegaly Male genitalia: normal Extremities: extremities normal, atraumatic, no cyanosis or edema Pulses: 2+ and symmetric Skin: Skin color, texture, turgor normal. No rashes or lesions Lymph nodes: Cervical, supraclavicular, and axillary nodes normal. Neurologic: Grossly normal Incision/Wound: Recent surgical sites c/d/i, no hernias. Rt nephr tube with copious uriniferous fluid output. Rt urinoma drain with scan uriniferous output.  Lab Results:   Recent Labs  03/04/13 0515 03/05/13 0500  WBC 11.8* 7.8  HGB 11.0* 9.9*  HCT 33.2* 29.6*  PLT 248 273   BMET  Recent Labs  03/04/13 0515 03/05/13 0500  NA 138 139  K 4.4 4.1  CL 105 106  CO2 20 21  GLUCOSE 99 98  BUN 24* 18  CREATININE 1.91* 1.11  CALCIUM 9.9 9.9   PT/INR  Recent Labs  03/03/13 1512  LABPROT 15.5*  INR 1.26   ABG No results found for this basename: PHART, PCO2, PO2, HCO3,  in the last 72 hours  Studies/Results: Ir Guided Drain W Catheter Placement  03/04/2013   CLINICAL DATA:  Partial right nephrectomy with urinoma  EXAM: ABSCESS DRAINAGE; PERC NEPHROSTOMY*R*; IR RIGHT PERC INTRO URET CATH; RIGHT NEPHROSTOGRAM; IR ULTRASOUND GUIDANCE TISSUE ABLATION  MEDICATIONS AND MEDICAL HISTORY: Versed 6 mg, Fentanyl 200 mcg.  Additional Medications: Rocephin 1 g.  ANESTHESIA/SEDATION: Moderate sedation time: 50  minutes  CONTRAST:  20 cc Omnipaque 300  FLUOROSCOPY TIME:  12 min and 42 seconds.  PROCEDURE: The procedure, risks, benefits, and alternatives were explained to the patient.  Questions regarding the procedure were encouraged and answered. The patient understands and consents to the procedure.  The back was prepped with Betadine in a sterile fashion, and a sterile drape was applied covering the operative field. A sterile gown and sterile gloves were used for the procedure.  Under sonographic guidance, a 22 gauge Chiba needle was inserted into the urinoma. Yellowish clear fluid was aspirated. The needle was removed over a 018 wire which was up sized to a 3 J. a 10 [place] dilator followed by a 10 [place] drain were inserted. This was looped and string fixed and sewn to the skin.  A Chiba needle was then inserted into the renal pelvis under fluoroscopic guidance. Contrast and air were injected after aspirating urine. A 21 gauge needle was then advanced into a gas-filled posterior interpolar region calyx over the 12th rib and removed over a 018 wire which was up sized to a Bentson. A copy catheter was advanced over the Bentson wire to the proximal ureter and contrast was injected demonstrating the ureteral leak from the proximal ureter into the urinoma.  The Kumpe the catheter was carefully advanced over a glidewire across the ureteral defect, into the distal ureter, and finally into the bladder. Contrast was injected opacifying the bladder. The wire was removed over the 3 J.  A 26 cm 8.5 French double-J ureteral stent was advanced over the wire. It was coiled in the bladder and renal pelvis. The string was removed without complication.  A nephrostomy was then advanced over the 3 J and coiled in the renal pelvis. It was looped and string fixed. It was sewn to the skin.  COMPLICATIONS: None  FINDINGS: Imaging demonstrates 66 French drain placement into the urinoma.  Subsequent imaging demonstrates opacifying the renal collecting system with contrast and gas via the renal pelvis.  Next series of images demonstrate access into and interpolar region posterior gas-filled interpolar region calyx.   The next image demonstrates a nephrostogram via a copy catheter in the proximal ureter. This does demonstrate the ureteral leak into the urinoma has while as ureteral obstruction just beyond the ureteral defect. The defect occurs approximately 10 cm beyond the ureteropelvic junction.  The next series of images demonstrate placement of a double-J ureteral stent extending from the right renal pelvis to the bladder as well as placement of a 10 French nephrostomy coiled in the renal pelvis.  IMPRESSION: Successful percutaneous nephrostomy catheter placement and double-J ureteral stent placement for a ureteral leak occurring 10 cm beyond the UP junction. A urinoma drain was also placed. Once the urinoma drain ceases, it can be removed. Nephrostomy catheter can also be removed at that time.   Electronically Signed   By: Maryclare Bean M.D.   On: 03/04/2013 12:35   Ir Nephrostogram Right  03/04/2013   CLINICAL DATA:  Partial right nephrectomy with urinoma  EXAM: ABSCESS DRAINAGE; PERC NEPHROSTOMY*R*; IR RIGHT PERC INTRO URET CATH; RIGHT NEPHROSTOGRAM; IR ULTRASOUND GUIDANCE TISSUE ABLATION  MEDICATIONS AND MEDICAL HISTORY: Versed 6 mg, Fentanyl 200 mcg.  Additional Medications: Rocephin 1 g.  ANESTHESIA/SEDATION: Moderate sedation time: 50 minutes  CONTRAST:  20 cc Omnipaque 300  FLUOROSCOPY TIME:  12 min and 42 seconds.  PROCEDURE: The procedure, risks, benefits, and alternatives were explained to the patient. Questions regarding the procedure were encouraged and  answered. The patient understands and consents to the procedure.  The back was prepped with Betadine in a sterile fashion, and a sterile drape was applied covering the operative field. A sterile gown and sterile gloves were used for the procedure.  Under sonographic guidance, a 22 gauge Chiba needle was inserted into the urinoma. Yellowish clear fluid was aspirated. The needle was removed over a 018 wire which was up sized to a 3 J. a 10 [place] dilator followed by a  10 [place] drain were inserted. This was looped and string fixed and sewn to the skin.  A Chiba needle was then inserted into the renal pelvis under fluoroscopic guidance. Contrast and air were injected after aspirating urine. A 21 gauge needle was then advanced into a gas-filled posterior interpolar region calyx over the 12th rib and removed over a 018 wire which was up sized to a Bentson. A copy catheter was advanced over the Bentson wire to the proximal ureter and contrast was injected demonstrating the ureteral leak from the proximal ureter into the urinoma.  The Kumpe the catheter was carefully advanced over a glidewire across the ureteral defect, into the distal ureter, and finally into the bladder. Contrast was injected opacifying the bladder. The wire was removed over the 3 J.  A 26 cm 8.5 French double-J ureteral stent was advanced over the wire. It was coiled in the bladder and renal pelvis. The string was removed without complication.  A nephrostomy was then advanced over the 3 J and coiled in the renal pelvis. It was looped and string fixed. It was sewn to the skin.  COMPLICATIONS: None  FINDINGS: Imaging demonstrates 84 French drain placement into the urinoma.  Subsequent imaging demonstrates opacifying the renal collecting system with contrast and gas via the renal pelvis.  Next series of images demonstrate access into and interpolar region posterior gas-filled interpolar region calyx.  The next image demonstrates a nephrostogram via a copy catheter in the proximal ureter. This does demonstrate the ureteral leak into the urinoma has while as ureteral obstruction just beyond the ureteral defect. The defect occurs approximately 10 cm beyond the ureteropelvic junction.  The next series of images demonstrate placement of a double-J ureteral stent extending from the right renal pelvis to the bladder as well as placement of a 10 French nephrostomy coiled in the renal pelvis.  IMPRESSION: Successful  percutaneous nephrostomy catheter placement and double-J ureteral stent placement for a ureteral leak occurring 10 cm beyond the UP junction. A urinoma drain was also placed. Once the urinoma drain ceases, it can be removed. Nephrostomy catheter can also be removed at that time.   Electronically Signed   By: Maryclare Bean M.D.   On: 03/04/2013 12:35   Ir Perc Nephrostomy Right  03/04/2013   CLINICAL DATA:  Partial right nephrectomy with urinoma  EXAM: ABSCESS DRAINAGE; PERC NEPHROSTOMY*R*; IR RIGHT PERC INTRO URET CATH; RIGHT NEPHROSTOGRAM; IR ULTRASOUND GUIDANCE TISSUE ABLATION  MEDICATIONS AND MEDICAL HISTORY: Versed 6 mg, Fentanyl 200 mcg.  Additional Medications: Rocephin 1 g.  ANESTHESIA/SEDATION: Moderate sedation time: 50 minutes  CONTRAST:  20 cc Omnipaque 300  FLUOROSCOPY TIME:  12 min and 42 seconds.  PROCEDURE: The procedure, risks, benefits, and alternatives were explained to the patient. Questions regarding the procedure were encouraged and answered. The patient understands and consents to the procedure.  The back was prepped with Betadine in a sterile fashion, and a sterile drape was applied covering the operative field. A sterile gown and sterile gloves  were used for the procedure.  Under sonographic guidance, a 22 gauge Chiba needle was inserted into the urinoma. Yellowish clear fluid was aspirated. The needle was removed over a 018 wire which was up sized to a 3 J. a 10 [place] dilator followed by a 10 [place] drain were inserted. This was looped and string fixed and sewn to the skin.  A Chiba needle was then inserted into the renal pelvis under fluoroscopic guidance. Contrast and air were injected after aspirating urine. A 21 gauge needle was then advanced into a gas-filled posterior interpolar region calyx over the 12th rib and removed over a 018 wire which was up sized to a Bentson. A copy catheter was advanced over the Bentson wire to the proximal ureter and contrast was injected demonstrating the  ureteral leak from the proximal ureter into the urinoma.  The Kumpe the catheter was carefully advanced over a glidewire across the ureteral defect, into the distal ureter, and finally into the bladder. Contrast was injected opacifying the bladder. The wire was removed over the 3 J.  A 26 cm 8.5 French double-J ureteral stent was advanced over the wire. It was coiled in the bladder and renal pelvis. The string was removed without complication.  A nephrostomy was then advanced over the 3 J and coiled in the renal pelvis. It was looped and string fixed. It was sewn to the skin.  COMPLICATIONS: None  FINDINGS: Imaging demonstrates 6 French drain placement into the urinoma.  Subsequent imaging demonstrates opacifying the renal collecting system with contrast and gas via the renal pelvis.  Next series of images demonstrate access into and interpolar region posterior gas-filled interpolar region calyx.  The next image demonstrates a nephrostogram via a copy catheter in the proximal ureter. This does demonstrate the ureteral leak into the urinoma has while as ureteral obstruction just beyond the ureteral defect. The defect occurs approximately 10 cm beyond the ureteropelvic junction.  The next series of images demonstrate placement of a double-J ureteral stent extending from the right renal pelvis to the bladder as well as placement of a 10 French nephrostomy coiled in the renal pelvis.  IMPRESSION: Successful percutaneous nephrostomy catheter placement and double-J ureteral stent placement for a ureteral leak occurring 10 cm beyond the UP junction. A urinoma drain was also placed. Once the urinoma drain ceases, it can be removed. Nephrostomy catheter can also be removed at that time.   Electronically Signed   By: Maryclare Bean M.D.   On: 03/04/2013 12:35   Ir US Guide Bx Asp/drain  03/04/2013   CLINICAL DATA:  Partial right nephrectomy with urinoma  EXAM: ABSCESS DRAINAGE; PERC NEPHROSTOMY*R*; IR RIGHT PERC INTRO URET  CATH; RIGHT NEPHROSTOGRAM; IR ULTRASOUND GUIDANCE TISSUE ABLATION  MEDICATIONS AND MEDICAL HISTORY: Versed 6 mg, Fentanyl 200 mcg.  Additional Medications: Rocephin 1 g.  ANESTHESIA/SEDATION: Moderate sedation time: 50 minutes  CONTRAST:  20 cc Omnipaque 300  FLUOROSCOPY TIME:  12 min and 42 seconds.  PROCEDURE: The procedure, risks, benefits, and alternatives were explained to the patient. Questions regarding the procedure were encouraged and answered. The patient understands and consents to the procedure.  The back was prepped with Betadine in a sterile fashion, and a sterile drape was applied covering the operative field. A sterile gown and sterile gloves were used for the procedure.  Under sonographic guidance, a 22 gauge Chiba needle was inserted into the urinoma. Yellowish clear fluid was aspirated. The needle was removed over a 018 wire which was up  sized to a 3 J. a 10 [place] dilator followed by a 10 [place] drain were inserted. This was looped and string fixed and sewn to the skin.  A Chiba needle was then inserted into the renal pelvis under fluoroscopic guidance. Contrast and air were injected after aspirating urine. A 21 gauge needle was then advanced into a gas-filled posterior interpolar region calyx over the 12th rib and removed over a 018 wire which was up sized to a Bentson. A copy catheter was advanced over the Bentson wire to the proximal ureter and contrast was injected demonstrating the ureteral leak from the proximal ureter into the urinoma.  The Kumpe the catheter was carefully advanced over a glidewire across the ureteral defect, into the distal ureter, and finally into the bladder. Contrast was injected opacifying the bladder. The wire was removed over the 3 J.  A 26 cm 8.5 French double-J ureteral stent was advanced over the wire. It was coiled in the bladder and renal pelvis. The string was removed without complication.  A nephrostomy was then advanced over the 3 J and coiled in the renal  pelvis. It was looped and string fixed. It was sewn to the skin.  COMPLICATIONS: None  FINDINGS: Imaging demonstrates 57 French drain placement into the urinoma.  Subsequent imaging demonstrates opacifying the renal collecting system with contrast and gas via the renal pelvis.  Next series of images demonstrate access into and interpolar region posterior gas-filled interpolar region calyx.  The next image demonstrates a nephrostogram via a copy catheter in the proximal ureter. This does demonstrate the ureteral leak into the urinoma has while as ureteral obstruction just beyond the ureteral defect. The defect occurs approximately 10 cm beyond the ureteropelvic junction.  The next series of images demonstrate placement of a double-J ureteral stent extending from the right renal pelvis to the bladder as well as placement of a 10 French nephrostomy coiled in the renal pelvis.  IMPRESSION: Successful percutaneous nephrostomy catheter placement and double-J ureteral stent placement for a ureteral leak occurring 10 cm beyond the UP junction. A urinoma drain was also placed. Once the urinoma drain ceases, it can be removed. Nephrostomy catheter can also be removed at that time.   Electronically Signed   By: Maryclare Bean M.D.   On: 03/04/2013 12:35   Ir US Guide Bx Asp/drain  03/04/2013   CLINICAL DATA:  Partial right nephrectomy with urinoma  EXAM: ABSCESS DRAINAGE; PERC NEPHROSTOMY*R*; IR RIGHT PERC INTRO URET CATH; RIGHT NEPHROSTOGRAM; IR ULTRASOUND GUIDANCE TISSUE ABLATION  MEDICATIONS AND MEDICAL HISTORY: Versed 6 mg, Fentanyl 200 mcg.  Additional Medications: Rocephin 1 g.  ANESTHESIA/SEDATION: Moderate sedation time: 50 minutes  CONTRAST:  20 cc Omnipaque 300  FLUOROSCOPY TIME:  12 min and 42 seconds.  PROCEDURE: The procedure, risks, benefits, and alternatives were explained to the patient. Questions regarding the procedure were encouraged and answered. The patient understands and consents to the procedure.  The back  was prepped with Betadine in a sterile fashion, and a sterile drape was applied covering the operative field. A sterile gown and sterile gloves were used for the procedure.  Under sonographic guidance, a 22 gauge Chiba needle was inserted into the urinoma. Yellowish clear fluid was aspirated. The needle was removed over a 018 wire which was up sized to a 3 J. a 10 [place] dilator followed by a 10 [place] drain were inserted. This was looped and string fixed and sewn to the skin.  A Chiba needle was then inserted  into the renal pelvis under fluoroscopic guidance. Contrast and air were injected after aspirating urine. A 21 gauge needle was then advanced into a gas-filled posterior interpolar region calyx over the 12th rib and removed over a 018 wire which was up sized to a Bentson. A copy catheter was advanced over the Bentson wire to the proximal ureter and contrast was injected demonstrating the ureteral leak from the proximal ureter into the urinoma.  The Kumpe the catheter was carefully advanced over a glidewire across the ureteral defect, into the distal ureter, and finally into the bladder. Contrast was injected opacifying the bladder. The wire was removed over the 3 J.  A 26 cm 8.5 French double-J ureteral stent was advanced over the wire. It was coiled in the bladder and renal pelvis. The string was removed without complication.  A nephrostomy was then advanced over the 3 J and coiled in the renal pelvis. It was looped and string fixed. It was sewn to the skin.  COMPLICATIONS: None  FINDINGS: Imaging demonstrates 41 French drain placement into the urinoma.  Subsequent imaging demonstrates opacifying the renal collecting system with contrast and gas via the renal pelvis.  Next series of images demonstrate access into and interpolar region posterior gas-filled interpolar region calyx.  The next image demonstrates a nephrostogram via a copy catheter in the proximal ureter. This does demonstrate the ureteral leak  into the urinoma has while as ureteral obstruction just beyond the ureteral defect. The defect occurs approximately 10 cm beyond the ureteropelvic junction.  The next series of images demonstrate placement of a double-J ureteral stent extending from the right renal pelvis to the bladder as well as placement of a 10 French nephrostomy coiled in the renal pelvis.  IMPRESSION: Successful percutaneous nephrostomy catheter placement and double-J ureteral stent placement for a ureteral leak occurring 10 cm beyond the UP junction. A urinoma drain was also placed. Once the urinoma drain ceases, it can be removed. Nephrostomy catheter can also be removed at that time.   Electronically Signed   By: Maryclare Bean M.D.   On: 03/04/2013 12:35   Ir Oris Drone Cath Perc Right  03/04/2013   CLINICAL DATA:  Partial right nephrectomy with urinoma  EXAM: ABSCESS DRAINAGE; PERC NEPHROSTOMY*R*; IR RIGHT PERC INTRO URET CATH; RIGHT NEPHROSTOGRAM; IR ULTRASOUND GUIDANCE TISSUE ABLATION  MEDICATIONS AND MEDICAL HISTORY: Versed 6 mg, Fentanyl 200 mcg.  Additional Medications: Rocephin 1 g.  ANESTHESIA/SEDATION: Moderate sedation time: 50 minutes  CONTRAST:  20 cc Omnipaque 300  FLUOROSCOPY TIME:  12 min and 42 seconds.  PROCEDURE: The procedure, risks, benefits, and alternatives were explained to the patient. Questions regarding the procedure were encouraged and answered. The patient understands and consents to the procedure.  The back was prepped with Betadine in a sterile fashion, and a sterile drape was applied covering the operative field. A sterile gown and sterile gloves were used for the procedure.  Under sonographic guidance, a 22 gauge Chiba needle was inserted into the urinoma. Yellowish clear fluid was aspirated. The needle was removed over a 018 wire which was up sized to a 3 J. a 10 [place] dilator followed by a 10 [place] drain were inserted. This was looped and string fixed and sewn to the skin.  A Chiba needle was then inserted  into the renal pelvis under fluoroscopic guidance. Contrast and air were injected after aspirating urine. A 21 gauge needle was then advanced into a gas-filled posterior interpolar region calyx over the 12th rib and  removed over a 018 wire which was up sized to a Bentson. A copy catheter was advanced over the Bentson wire to the proximal ureter and contrast was injected demonstrating the ureteral leak from the proximal ureter into the urinoma.  The Kumpe the catheter was carefully advanced over a glidewire across the ureteral defect, into the distal ureter, and finally into the bladder. Contrast was injected opacifying the bladder. The wire was removed over the 3 J.  A 26 cm 8.5 French double-J ureteral stent was advanced over the wire. It was coiled in the bladder and renal pelvis. The string was removed without complication.  A nephrostomy was then advanced over the 3 J and coiled in the renal pelvis. It was looped and string fixed. It was sewn to the skin.  COMPLICATIONS: None  FINDINGS: Imaging demonstrates 32 French drain placement into the urinoma.  Subsequent imaging demonstrates opacifying the renal collecting system with contrast and gas via the renal pelvis.  Next series of images demonstrate access into and interpolar region posterior gas-filled interpolar region calyx.  The next image demonstrates a nephrostogram via a copy catheter in the proximal ureter. This does demonstrate the ureteral leak into the urinoma has while as ureteral obstruction just beyond the ureteral defect. The defect occurs approximately 10 cm beyond the ureteropelvic junction.  The next series of images demonstrate placement of a double-J ureteral stent extending from the right renal pelvis to the bladder as well as placement of a 10 French nephrostomy coiled in the renal pelvis.  IMPRESSION: Successful percutaneous nephrostomy catheter placement and double-J ureteral stent placement for a ureteral leak occurring 10 cm beyond the  UP junction. A urinoma drain was also placed. Once the urinoma drain ceases, it can be removed. Nephrostomy catheter can also be removed at that time.   Electronically Signed   By: Maryclare Bean M.D.   On: 03/04/2013 12:35    Anti-infectives: Anti-infectives   Start     Dose/Rate Route Frequency Ordered Stop   03/04/13 1000  cefTRIAXone (ROCEPHIN) 1 g in dextrose 5 % 50 mL IVPB     1 g 100 mL/hr over 30 Minutes Intravenous Every 24 hours 03/03/13 1436     03/03/13 1030  cefTRIAXone (ROCEPHIN) 1 g in dextrose 5 % 50 mL IVPB     1 g 100 mL/hr over 30 Minutes Intravenous  Once 03/03/13 1020 03/03/13 1138      Assessment/Plan:  1- Moderate Risk Prostate Cancer - first PSA to be drawn around 03/2013.   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   3 - Rt Retroperitoneal Urinoma / Ureteral Leak - Drains functioning well and serum Cr back to normal suggests good placement. Recheck BMP today to verify. As UCX's negative and pt afebrile, I feel he is safe for DC from GU perspective at anytime leaving current drains in place.   I will arrange close GU follow-up with office visit with me next week.  Please call  anytime with questions. Greatly appreciate hospitalist help.   [facility], David [name] 03/06/2013

## 2013-03-06 NOTE — Discharge Summary (Signed)
Physician Discharge Summary  David [name] [id number] DOB: 15-Mar-1940 DOA: 03/03/2013  PCP: Drema Pry, DO  Admit date: 03/03/2013 Discharge date: 03/06/2013  Recommendations for Outpatient Follow-up:  1. Pt will need to follow up with PCP in 2 weeks post discharge 2. Please obtain BMP to evaluate electrolytes and kidney function 3. Please also check CBC to evaluate Hg and Hct levels 4. Follow up with Dr. [name] in one week  Discharge Diagnoses:  Principal Problem:   Pyelonephritis Active Problems:   HYPERLIPIDEMIA   HYPERTENSION   Prostate cancer   Renal oncocytoma of right kidney   Right retroperitoneal urinoma with ureteral leak   Acute renal failure   Perinephric abscess Pyelonephritis/perinephric abscess  CT scan done on 03/03/2012 showed 7.4 x 9.6 cm predominately simple appearing fluid collection adjacent to and inferior to the lower pole of the right kidney. Continue empiric antibiotics with Rocephin. Urine cultures from -03/03/13 and 03/04/13 were negative -send patient home with ciprofloxacin 500mg  bid x 10 days which would finish 14 days of therapy. The patient had 4 days of intravenous ceftriaxone during the hospitalization. -On the day of discharge, the patient was afebrile and hemodynamically stable. -He has an appointment to followup with urology, Dr. [name] in one week  Active Problems:  HYPERLIPIDEMIA  Continue Zocor.  HYPERTENSION  Continue Azor.  Prostate cancer  Status post radical prostatectomy 01/2013 for [id number] Gleason 3+4=7 adenocarcinoma, margins negative. Was previously on surveillance for low-risk disease that had progressed by protocol-biopsy. PSA level to be checked in February.  -Followup with Dr. [name] in office Renal oncocytoma of right kidney  Status post right robotic partial nephrectomy 01/2013 for 3.5cm lower pole enhancing mass, path confirmed benign oncocytoma, with no further evaluation recommended by urologist--Dr. [name] Right retroperitoneal  urinoma / ureteral leak / acute renals failure  Pt admitted 03/03/13 and found to have right retroperitoneal urinoma and elevated Cr worrisome for urine leak from recent partial nephrectomy. He had minimal drain output and normal Cr when discharged postop. IR placed urinoma drain 03/04/13 and also rt nephrostomy + JJ ureteral stent, found to have extravasation from mid ureter (not at level of prior renorrhaphy). Drain currently functioning well with normalization of creatinine. -Serum creatinine 0.84 on the day discharge   Discharge Condition:  Stable Disposition:  home   Diet: Cardiac Wt Readings from Last 3 Encounters:  03/06/13 93.668 kg (206 lb 8 oz)  02/15/13 101.969 kg (224 lb 12.8 oz)  02/15/13 101.969 kg (224 lb 12.8 oz)    History of present illness:  Pt has hx of moderate risk prostate CA w/ concerning R renal neoplasm as well as HTN and HLD. underwents/p robotic rt partial nephrectomy + prostatectomy + adhesiolysis 02/15/2013,  by Dr [name]. He did suffer from mild post op ileus but this resolved. He does have hx of abd hernia repair w/ mesh. All pathology from the renal mass and surround nodes were negative for malignancy. His prostate bx showed gleason 3+4 = 7 prostate cancer. He was discharged w/ a foley catheter  2 days prior to admission in the outpt urology office, the foley was removed. Starting 1 day prior to admission, he starting feeling week and noticed fever today up to 101.5. He has not had trouble voiding and is difficulty to assess new dysuria given recent removal of foley catheter. He was stopping his ppx abx yesterday that were started to prevent infection around the removal of the foley catheter. He has no other complaints today other than  postop abd pain that was assessed in urology clinic on Friday and I assume was thought to be normal.  In the ED, he was afebrile but had leukocytosis of 13.5. U/A showed mod leuks and cx is pending. Pt was started on rocephin and  [facility] was consulted and will see the patient. CT A/P w/o cx was ordered as well and will be performed soon     Consultants: Urology--Dr. [name] -IR  Discharge Exam: Filed Vitals:   03/06/13 0428  BP: 125/62  Pulse: 82  Temp: 98.3 F (36.8 C)  Resp: 16   Filed Vitals:   03/05/13 1037 03/05/13 1503 03/05/13 2205 03/06/13 0428  BP: 111/50 127/60 129/54 125/62  Pulse:  86 73 82  Temp:  97.7 F (36.5 C) 98.6 F (37 C) 98.3 F (36.8 C)  TempSrc:  Oral Oral Oral  Resp:  20 18 16   Height:      Weight:    93.668 kg (206 lb 8 oz)  SpO2:  96% 97% 96%   General: A&O x 3, NAD, pleasant, cooperative Cardiovascular: RRR, no rub, no gallop, no S3 Respiratory: CTAB, no wheeze, no rhonchi Abdomen:soft, nontender, nondistended, positive bowel sounds Extremities: No edema, No lymphangitis, no petechiae  Discharge Instructions      Discharge Orders   Future Appointments Provider Department Dept Phone   03/28/2013 1:15 PM [facility] [place] [phone]   03/28/2013 1:30 PM [place] [phone]   03/28/2013 2:00 PM [name], MD [place] [phone]   Future Orders Complete By Expires   Diet - low sodium heart healthy  As directed    Discharge instructions  As directed    Comments:     Do not take mobic until you follow up with your primary care physician   Increase activity slowly  As directed        Medication List    STOP taking these medications       meloxicam 15 MG tablet  Commonly known as:  MOBIC     sulfamethoxazole-trimethoprim 400-80 MG per tablet  Commonly known as:  BACTRIM      TAKE these medications       acetaminophen 500 MG tablet  Commonly known as:  TYLENOL  Take 1,000 mg by mouth every 6 (six) hours as needed for moderate pain.     aspirin EC 81 MG tablet  Take 81 mg by mouth daily.     AZOR 5-20 MG  per tablet  Generic drug:  amLODipine-olmesartan  Take 1 tablet by mouth every evening.     ciprofloxacin 500 MG tablet  Commonly known as:  CIPRO  Take 1 tablet (500 mg total) by mouth 2 (two) times daily.     mirabegron ER 25 MG Tb24 tablet  Commonly known as:  MYRBETRIQ  Take 1 tablet (25 mg total) by mouth daily.     multivitamin with minerals Tabs tablet  Take 1 tablet by mouth daily.     omeprazole 20 MG capsule  Commonly known as:  PRILOSEC  Take 20 mg by mouth every evening.     senna-docusate 8.6-50 MG per tablet  Commonly known as:  Senokot-S  Take 1 tablet by mouth 2 (two) times daily. While taking pain meds to prevent constipation     simvastatin 40 MG tablet  Commonly known as:  ZOCOR  take 1 tablet  by mouth at bedtime     sodium chloride 0.65 % nasal spray  Commonly known as:  OCEAN  Place 1 spray into the nose daily as needed for congestion.     traMADol 50 MG tablet  Commonly known as:  ULTRAM  Take 1-2 tablets (50-100 mg total) by mouth every 6 (six) hours as needed for moderate pain. Post-operatively         The results of significant diagnostics from this hospitalization (including imaging, microbiology, ancillary and laboratory) are listed below for reference.    Significant Diagnostic Studies: Ct Abdomen Pelvis Wo Contrast  03/03/2013   CLINICAL DATA:  Fever in weakness. Prior prostatectomy for prostate cancer. Partial right nephrectomy/lymphadenectomy 02/15/2013.  EXAM: CT ABDOMEN AND PELVIS WITHOUT CONTRAST  TECHNIQUE: Multidetector CT imaging of the abdomen and pelvis was performed following the standard protocol without intravenous contrast.  COMPARISON:  01/18/2013 and 04/07/2005  FINDINGS: Lung bases demonstrate a small right pleural effusion with associated atelectasis which is new. The minimal calcification over the left anterior descending and lateral circumflex arteries.  Abdominal images demonstrate a normal liver, spleen, pancreas,  gallbladder and adrenal glands. Left kidney is normal in size with a few small stones the largest over the upper pole measuring 5 mm. The left ureter is normal. The right kidney is normal in size with a couple small 1-2 mm nonobstructing stones. There is mild right-sided hydronephrosis. There is a 7.4 x 9.6 cm predominately simple appearing fluid collection adjacent to an immediately below the inferior pole of the right kidney. There is stranding of the right perinephric fat. The proximal right ureter courses through some of this fluid collection as the ureter is otherwise within normal. The appendix is normal. No evidence of bowel obstruction.  There postsurgical changes over the umbilical region. Pelvic images demonstrate surgical absence of the prostate. Small left inguinal hernia containing only mesenteric fat. Remainder of the exam is unchanged.  IMPRESSION: 7.4 x 9.6 cm predominately simple appearing fluid collection adjacent to and inferior to the lower pole of the right kidney. Patient underwent recent partial right nephrectomy of lower pole exophytic mass 02/15/2013 as this postoperative fluid collection may be infectious versus noninfectious in nature. Proximal right ureter courses through the anterior medial aspect of this fluid collection as there is mild dilatation of the right intrarenal collecting system.  Bilateral nephrolithiasis.  Small moderate pleural fluid with associated atelectasis.  Prior prostatectomy.   Electronically Signed   By: Marin Olp M.D.   On: 03/03/2013 13:11   Dg Chest 2 View  03/03/2013   CLINICAL DATA:  Fever.  History of hypertension and prostate cancer.  EXAM: CHEST  2 VIEW  COMPARISON:  02/12/2013 and 03/10/2009.  FINDINGS: There are lower lung volumes with mildly increased linear atelectasis at both lung bases. No airspace disease, edema or pleural effusion is identified. The heart size and mediastinal contours are stable for the degree of inspiration. Thoracic  kyphosis and spondylosis appear unchanged.  IMPRESSION: Lower lung volumes with mildly increased linear bibasilar atelectasis. No evidence of focal airspace disease.   Electronically Signed   By: Camie Patience M.D.   On: 03/03/2013 09:52   Dg Chest 2 View  02/12/2013   CLINICAL DATA:  Preop for prostatectomy.  Hypertension.  EXAM: CHEST  2 VIEW  COMPARISON:  03/10/2009  FINDINGS: Cardiac silhouette is normal in size. Mediastinum is normal in contour. No hilar masses.  Clear lungs.  No pleural effusion or pneumothorax.  Bones are diffusely  demineralized. Old rib fractures noted on the right. No change from the prior study.  IMPRESSION: No active cardiopulmonary disease.   Electronically Signed   By: Lajean Manes M.D.   On: 02/12/2013 16:56   Dg Abd 1 View  02/18/2013   CLINICAL DATA:  Lower back pain, severe abdominal distention.  EXAM: ABDOMEN - 1 VIEW  COMPARISON:  August 17, 2011.  FINDINGS: Mildly dilated small bowel loops are noted concerning for distal small bowel obstruction or ileus. Stool is noted in the colon. Air is noted in the rectum. There is noted catheter projected over the pelvis which may represent an urinary bladder or rectal catheter. Clinical correlation is recommended.  IMPRESSION: Mildly dilated small bowel loops are noted which may represent ileus or possibly distal small bowel obstruction. Followup radiographs are recommended.   Electronically Signed   By: Sabino Dick M.D.   On: 02/18/2013 09:28   Ir Guided Niel Hummer W Catheter Placement  03/04/2013   CLINICAL DATA:  Partial right nephrectomy with urinoma  EXAM: ABSCESS DRAINAGE; PERC NEPHROSTOMY*R*; IR RIGHT PERC INTRO URET CATH; RIGHT NEPHROSTOGRAM; IR ULTRASOUND GUIDANCE TISSUE ABLATION  MEDICATIONS AND MEDICAL HISTORY: Versed 6 mg, Fentanyl 200 mcg.  Additional Medications: Rocephin 1 g.  ANESTHESIA/SEDATION: Moderate sedation time: 50 minutes  CONTRAST:  20 cc Omnipaque 300  FLUOROSCOPY TIME:  12 min and 42 seconds.  PROCEDURE: The  procedure, risks, benefits, and alternatives were explained to the patient. Questions regarding the procedure were encouraged and answered. The patient understands and consents to the procedure.  The back was prepped with Betadine in a sterile fashion, and a sterile drape was applied covering the operative field. A sterile gown and sterile gloves were used for the procedure.  Under sonographic guidance, a 22 gauge Chiba needle was inserted into the urinoma. Yellowish clear fluid was aspirated. The needle was removed over a 018 wire which was up sized to a 3 J. a 10 [place] dilator followed by a 10 [place] drain were inserted. This was looped and string fixed and sewn to the skin.  A Chiba needle was then inserted into the renal pelvis under fluoroscopic guidance. Contrast and air were injected after aspirating urine. A 21 gauge needle was then advanced into a gas-filled posterior interpolar region calyx over the 12th rib and removed over a 018 wire which was up sized to a Bentson. A copy catheter was advanced over the Bentson wire to the proximal ureter and contrast was injected demonstrating the ureteral leak from the proximal ureter into the urinoma.  The Kumpe the catheter was carefully advanced over a glidewire across the ureteral defect, into the distal ureter, and finally into the bladder. Contrast was injected opacifying the bladder. The wire was removed over the 3 J.  A 26 cm 8.5 French double-J ureteral stent was advanced over the wire. It was coiled in the bladder and renal pelvis. The string was removed without complication.  A nephrostomy was then advanced over the 3 J and coiled in the renal pelvis. It was looped and string fixed. It was sewn to the skin.  COMPLICATIONS: None  FINDINGS: Imaging demonstrates 44 French drain placement into the urinoma.  Subsequent imaging demonstrates opacifying the renal collecting system with contrast and gas via the renal pelvis.  Next series of images demonstrate access  into and interpolar region posterior gas-filled interpolar region calyx.  The next image demonstrates a nephrostogram via a copy catheter in the proximal ureter. This does demonstrate the ureteral leak  into the urinoma has while as ureteral obstruction just beyond the ureteral defect. The defect occurs approximately 10 cm beyond the ureteropelvic junction.  The next series of images demonstrate placement of a double-J ureteral stent extending from the right renal pelvis to the bladder as well as placement of a 10 French nephrostomy coiled in the renal pelvis.  IMPRESSION: Successful percutaneous nephrostomy catheter placement and double-J ureteral stent placement for a ureteral leak occurring 10 cm beyond the UP junction. A urinoma drain was also placed. Once the urinoma drain ceases, it can be removed. Nephrostomy catheter can also be removed at that time.   Electronically Signed   By: Maryclare Bean M.D.   On: 03/04/2013 12:35   Ir Nephrostogram Right  03/04/2013   CLINICAL DATA:  Partial right nephrectomy with urinoma  EXAM: ABSCESS DRAINAGE; PERC NEPHROSTOMY*R*; IR RIGHT PERC INTRO URET CATH; RIGHT NEPHROSTOGRAM; IR ULTRASOUND GUIDANCE TISSUE ABLATION  MEDICATIONS AND MEDICAL HISTORY: Versed 6 mg, Fentanyl 200 mcg.  Additional Medications: Rocephin 1 g.  ANESTHESIA/SEDATION: Moderate sedation time: 50 minutes  CONTRAST:  20 cc Omnipaque 300  FLUOROSCOPY TIME:  12 min and 42 seconds.  PROCEDURE: The procedure, risks, benefits, and alternatives were explained to the patient. Questions regarding the procedure were encouraged and answered. The patient understands and consents to the procedure.  The back was prepped with Betadine in a sterile fashion, and a sterile drape was applied covering the operative field. A sterile gown and sterile gloves were used for the procedure.  Under sonographic guidance, a 22 gauge Chiba needle was inserted into the urinoma. Yellowish clear fluid was aspirated. The needle was removed over  a 018 wire which was up sized to a 3 J. a 10 [place] dilator followed by a 10 [place] drain were inserted. This was looped and string fixed and sewn to the skin.  A Chiba needle was then inserted into the renal pelvis under fluoroscopic guidance. Contrast and air were injected after aspirating urine. A 21 gauge needle was then advanced into a gas-filled posterior interpolar region calyx over the 12th rib and removed over a 018 wire which was up sized to a Bentson. A copy catheter was advanced over the Bentson wire to the proximal ureter and contrast was injected demonstrating the ureteral leak from the proximal ureter into the urinoma.  The Kumpe the catheter was carefully advanced over a glidewire across the ureteral defect, into the distal ureter, and finally into the bladder. Contrast was injected opacifying the bladder. The wire was removed over the 3 J.  A 26 cm 8.5 French double-J ureteral stent was advanced over the wire. It was coiled in the bladder and renal pelvis. The string was removed without complication.  A nephrostomy was then advanced over the 3 J and coiled in the renal pelvis. It was looped and string fixed. It was sewn to the skin.  COMPLICATIONS: None  FINDINGS: Imaging demonstrates 50 French drain placement into the urinoma.  Subsequent imaging demonstrates opacifying the renal collecting system with contrast and gas via the renal pelvis.  Next series of images demonstrate access into and interpolar region posterior gas-filled interpolar region calyx.  The next image demonstrates a nephrostogram via a copy catheter in the proximal ureter. This does demonstrate the ureteral leak into the urinoma has while as ureteral obstruction just beyond the ureteral defect. The defect occurs approximately 10 cm beyond the ureteropelvic junction.  The next series of images demonstrate placement of a double-J ureteral stent extending from  the right renal pelvis to the bladder as well as placement of a 10 French  nephrostomy coiled in the renal pelvis.  IMPRESSION: Successful percutaneous nephrostomy catheter placement and double-J ureteral stent placement for a ureteral leak occurring 10 cm beyond the UP junction. A urinoma drain was also placed. Once the urinoma drain ceases, it can be removed. Nephrostomy catheter can also be removed at that time.   Electronically Signed   By: Maryclare Bean M.D.   On: 03/04/2013 12:35   Ir Perc Nephrostomy Right  03/04/2013   CLINICAL DATA:  Partial right nephrectomy with urinoma  EXAM: ABSCESS DRAINAGE; PERC NEPHROSTOMY*R*; IR RIGHT PERC INTRO URET CATH; RIGHT NEPHROSTOGRAM; IR ULTRASOUND GUIDANCE TISSUE ABLATION  MEDICATIONS AND MEDICAL HISTORY: Versed 6 mg, Fentanyl 200 mcg.  Additional Medications: Rocephin 1 g.  ANESTHESIA/SEDATION: Moderate sedation time: 50 minutes  CONTRAST:  20 cc Omnipaque 300  FLUOROSCOPY TIME:  12 min and 42 seconds.  PROCEDURE: The procedure, risks, benefits, and alternatives were explained to the patient. Questions regarding the procedure were encouraged and answered. The patient understands and consents to the procedure.  The back was prepped with Betadine in a sterile fashion, and a sterile drape was applied covering the operative field. A sterile gown and sterile gloves were used for the procedure.  Under sonographic guidance, a 22 gauge Chiba needle was inserted into the urinoma. Yellowish clear fluid was aspirated. The needle was removed over a 018 wire which was up sized to a 3 J. a 10 [place] dilator followed by a 10 [place] drain were inserted. This was looped and string fixed and sewn to the skin.  A Chiba needle was then inserted into the renal pelvis under fluoroscopic guidance. Contrast and air were injected after aspirating urine. A 21 gauge needle was then advanced into a gas-filled posterior interpolar region calyx over the 12th rib and removed over a 018 wire which was up sized to a Bentson. A copy catheter was advanced over the Bentson wire to  the proximal ureter and contrast was injected demonstrating the ureteral leak from the proximal ureter into the urinoma.  The Kumpe the catheter was carefully advanced over a glidewire across the ureteral defect, into the distal ureter, and finally into the bladder. Contrast was injected opacifying the bladder. The wire was removed over the 3 J.  A 26 cm 8.5 French double-J ureteral stent was advanced over the wire. It was coiled in the bladder and renal pelvis. The string was removed without complication.  A nephrostomy was then advanced over the 3 J and coiled in the renal pelvis. It was looped and string fixed. It was sewn to the skin.  COMPLICATIONS: None  FINDINGS: Imaging demonstrates 110 French drain placement into the urinoma.  Subsequent imaging demonstrates opacifying the renal collecting system with contrast and gas via the renal pelvis.  Next series of images demonstrate access into and interpolar region posterior gas-filled interpolar region calyx.  The next image demonstrates a nephrostogram via a copy catheter in the proximal ureter. This does demonstrate the ureteral leak into the urinoma has while as ureteral obstruction just beyond the ureteral defect. The defect occurs approximately 10 cm beyond the ureteropelvic junction.  The next series of images demonstrate placement of a double-J ureteral stent extending from the right renal pelvis to the bladder as well as placement of a 10 French nephrostomy coiled in the renal pelvis.  IMPRESSION: Successful percutaneous nephrostomy catheter placement and double-J ureteral stent placement for a ureteral  leak occurring 10 cm beyond the UP junction. A urinoma drain was also placed. Once the urinoma drain ceases, it can be removed. Nephrostomy catheter can also be removed at that time.   Electronically Signed   By: Maryclare Bean M.D.   On: 03/04/2013 12:35   Ir US Guide Bx Asp/drain  03/04/2013   CLINICAL DATA:  Partial right nephrectomy with urinoma  EXAM:  ABSCESS DRAINAGE; PERC NEPHROSTOMY*R*; IR RIGHT PERC INTRO URET CATH; RIGHT NEPHROSTOGRAM; IR ULTRASOUND GUIDANCE TISSUE ABLATION  MEDICATIONS AND MEDICAL HISTORY: Versed 6 mg, Fentanyl 200 mcg.  Additional Medications: Rocephin 1 g.  ANESTHESIA/SEDATION: Moderate sedation time: 50 minutes  CONTRAST:  20 cc Omnipaque 300  FLUOROSCOPY TIME:  12 min and 42 seconds.  PROCEDURE: The procedure, risks, benefits, and alternatives were explained to the patient. Questions regarding the procedure were encouraged and answered. The patient understands and consents to the procedure.  The back was prepped with Betadine in a sterile fashion, and a sterile drape was applied covering the operative field. A sterile gown and sterile gloves were used for the procedure.  Under sonographic guidance, a 22 gauge Chiba needle was inserted into the urinoma. Yellowish clear fluid was aspirated. The needle was removed over a 018 wire which was up sized to a 3 J. a 10 [place] dilator followed by a 10 [place] drain were inserted. This was looped and string fixed and sewn to the skin.  A Chiba needle was then inserted into the renal pelvis under fluoroscopic guidance. Contrast and air were injected after aspirating urine. A 21 gauge needle was then advanced into a gas-filled posterior interpolar region calyx over the 12th rib and removed over a 018 wire which was up sized to a Bentson. A copy catheter was advanced over the Bentson wire to the proximal ureter and contrast was injected demonstrating the ureteral leak from the proximal ureter into the urinoma.  The Kumpe the catheter was carefully advanced over a glidewire across the ureteral defect, into the distal ureter, and finally into the bladder. Contrast was injected opacifying the bladder. The wire was removed over the 3 J.  A 26 cm 8.5 French double-J ureteral stent was advanced over the wire. It was coiled in the bladder and renal pelvis. The string was removed without complication.  A  nephrostomy was then advanced over the 3 J and coiled in the renal pelvis. It was looped and string fixed. It was sewn to the skin.  COMPLICATIONS: None  FINDINGS: Imaging demonstrates 77 French drain placement into the urinoma.  Subsequent imaging demonstrates opacifying the renal collecting system with contrast and gas via the renal pelvis.  Next series of images demonstrate access into and interpolar region posterior gas-filled interpolar region calyx.  The next image demonstrates a nephrostogram via a copy catheter in the proximal ureter. This does demonstrate the ureteral leak into the urinoma has while as ureteral obstruction just beyond the ureteral defect. The defect occurs approximately 10 cm beyond the ureteropelvic junction.  The next series of images demonstrate placement of a double-J ureteral stent extending from the right renal pelvis to the bladder as well as placement of a 10 French nephrostomy coiled in the renal pelvis.  IMPRESSION: Successful percutaneous nephrostomy catheter placement and double-J ureteral stent placement for a ureteral leak occurring 10 cm beyond the UP junction. A urinoma drain was also placed. Once the urinoma drain ceases, it can be removed. Nephrostomy catheter can also be removed at that time.   Electronically  Signed   By: Maryclare Bean M.D.   On: 03/04/2013 12:35   Ir US Guide Bx Asp/drain  03/04/2013   CLINICAL DATA:  Partial right nephrectomy with urinoma  EXAM: ABSCESS DRAINAGE; PERC NEPHROSTOMY*R*; IR RIGHT PERC INTRO URET CATH; RIGHT NEPHROSTOGRAM; IR ULTRASOUND GUIDANCE TISSUE ABLATION  MEDICATIONS AND MEDICAL HISTORY: Versed 6 mg, Fentanyl 200 mcg.  Additional Medications: Rocephin 1 g.  ANESTHESIA/SEDATION: Moderate sedation time: 50 minutes  CONTRAST:  20 cc Omnipaque 300  FLUOROSCOPY TIME:  12 min and 42 seconds.  PROCEDURE: The procedure, risks, benefits, and alternatives were explained to the patient. Questions regarding the procedure were encouraged and  answered. The patient understands and consents to the procedure.  The back was prepped with Betadine in a sterile fashion, and a sterile drape was applied covering the operative field. A sterile gown and sterile gloves were used for the procedure.  Under sonographic guidance, a 22 gauge Chiba needle was inserted into the urinoma. Yellowish clear fluid was aspirated. The needle was removed over a 018 wire which was up sized to a 3 J. a 10 [place] dilator followed by a 10 [place] drain were inserted. This was looped and string fixed and sewn to the skin.  A Chiba needle was then inserted into the renal pelvis under fluoroscopic guidance. Contrast and air were injected after aspirating urine. A 21 gauge needle was then advanced into a gas-filled posterior interpolar region calyx over the 12th rib and removed over a 018 wire which was up sized to a Bentson. A copy catheter was advanced over the Bentson wire to the proximal ureter and contrast was injected demonstrating the ureteral leak from the proximal ureter into the urinoma.  The Kumpe the catheter was carefully advanced over a glidewire across the ureteral defect, into the distal ureter, and finally into the bladder. Contrast was injected opacifying the bladder. The wire was removed over the 3 J.  A 26 cm 8.5 French double-J ureteral stent was advanced over the wire. It was coiled in the bladder and renal pelvis. The string was removed without complication.  A nephrostomy was then advanced over the 3 J and coiled in the renal pelvis. It was looped and string fixed. It was sewn to the skin.  COMPLICATIONS: None  FINDINGS: Imaging demonstrates 52 French drain placement into the urinoma.  Subsequent imaging demonstrates opacifying the renal collecting system with contrast and gas via the renal pelvis.  Next series of images demonstrate access into and interpolar region posterior gas-filled interpolar region calyx.  The next image demonstrates a nephrostogram via a copy  catheter in the proximal ureter. This does demonstrate the ureteral leak into the urinoma has while as ureteral obstruction just beyond the ureteral defect. The defect occurs approximately 10 cm beyond the ureteropelvic junction.  The next series of images demonstrate placement of a double-J ureteral stent extending from the right renal pelvis to the bladder as well as placement of a 10 French nephrostomy coiled in the renal pelvis.  IMPRESSION: Successful percutaneous nephrostomy catheter placement and double-J ureteral stent placement for a ureteral leak occurring 10 cm beyond the UP junction. A urinoma drain was also placed. Once the urinoma drain ceases, it can be removed. Nephrostomy catheter can also be removed at that time.   Electronically Signed   By: Maryclare Bean M.D.   On: 03/04/2013 12:35   Ir Oris Drone Cath Perc Right  03/04/2013   CLINICAL DATA:  Partial right nephrectomy with urinoma  EXAM:  ABSCESS DRAINAGE; PERC NEPHROSTOMY*R*; IR RIGHT PERC INTRO URET CATH; RIGHT NEPHROSTOGRAM; IR ULTRASOUND GUIDANCE TISSUE ABLATION  MEDICATIONS AND MEDICAL HISTORY: Versed 6 mg, Fentanyl 200 mcg.  Additional Medications: Rocephin 1 g.  ANESTHESIA/SEDATION: Moderate sedation time: 50 minutes  CONTRAST:  20 cc Omnipaque 300  FLUOROSCOPY TIME:  12 min and 42 seconds.  PROCEDURE: The procedure, risks, benefits, and alternatives were explained to the patient. Questions regarding the procedure were encouraged and answered. The patient understands and consents to the procedure.  The back was prepped with Betadine in a sterile fashion, and a sterile drape was applied covering the operative field. A sterile gown and sterile gloves were used for the procedure.  Under sonographic guidance, a 22 gauge Chiba needle was inserted into the urinoma. Yellowish clear fluid was aspirated. The needle was removed over a 018 wire which was up sized to a 3 J. a 10 [place] dilator followed by a 10 [place] drain were inserted. This was looped  and string fixed and sewn to the skin.  A Chiba needle was then inserted into the renal pelvis under fluoroscopic guidance. Contrast and air were injected after aspirating urine. A 21 gauge needle was then advanced into a gas-filled posterior interpolar region calyx over the 12th rib and removed over a 018 wire which was up sized to a Bentson. A copy catheter was advanced over the Bentson wire to the proximal ureter and contrast was injected demonstrating the ureteral leak from the proximal ureter into the urinoma.  The Kumpe the catheter was carefully advanced over a glidewire across the ureteral defect, into the distal ureter, and finally into the bladder. Contrast was injected opacifying the bladder. The wire was removed over the 3 J.  A 26 cm 8.5 French double-J ureteral stent was advanced over the wire. It was coiled in the bladder and renal pelvis. The string was removed without complication.  A nephrostomy was then advanced over the 3 J and coiled in the renal pelvis. It was looped and string fixed. It was sewn to the skin.  COMPLICATIONS: None  FINDINGS: Imaging demonstrates 33 French drain placement into the urinoma.  Subsequent imaging demonstrates opacifying the renal collecting system with contrast and gas via the renal pelvis.  Next series of images demonstrate access into and interpolar region posterior gas-filled interpolar region calyx.  The next image demonstrates a nephrostogram via a copy catheter in the proximal ureter. This does demonstrate the ureteral leak into the urinoma has while as ureteral obstruction just beyond the ureteral defect. The defect occurs approximately 10 cm beyond the ureteropelvic junction.  The next series of images demonstrate placement of a double-J ureteral stent extending from the right renal pelvis to the bladder as well as placement of a 10 French nephrostomy coiled in the renal pelvis.  IMPRESSION: Successful percutaneous nephrostomy catheter placement and double-J  ureteral stent placement for a ureteral leak occurring 10 cm beyond the UP junction. A urinoma drain was also placed. Once the urinoma drain ceases, it can be removed. Nephrostomy catheter can also be removed at that time.   Electronically Signed   By: Maryclare Bean M.D.   On: 03/04/2013 12:35     Microbiology: Recent Results (from the past 240 hour(s))  URINE CULTURE     Status: None   Collection Time    03/03/13 10:00 AM      Result Value Range Status   Specimen Description URINE, CLEAN CATCH   Final   Special Requests NONE  Final   Culture  Setup Time     Final   Value: 03/03/2013 16:22     Performed at [facility] Count     Final   Value: NO GROWTH     Performed at [facility]   Culture     Final   Value: NO GROWTH     Performed at [facility]   Report Status 03/04/2013 FINAL   Final  URINE CULTURE     Status: None   Collection Time    03/04/13  7:03 PM      Result Value Range Status   Specimen Description URINE, RANDOM   Final   Special Requests NONE   Final   Culture  Setup Time     Final   Value: 03/04/2013 22:01     Performed at [facility]     Final   Value: NO GROWTH     Performed at [facility]   Culture     Final   Value: NO GROWTH     Performed at [facility]   Report Status 03/05/2013 FINAL   Final     Labs: Basic Metabolic Panel:  Recent Labs Lab 03/03/13 0910 03/04/13 0515 03/05/13 0500 03/06/13 0932  NA 138 138 139 140  K 4.5 4.4 4.1 3.9  CL 104 105 106 108  CO2 20 20 21 21   GLUCOSE 122* 99 98 134*  BUN 25* 24* 18 12  CREATININE 2.00* 1.91* 1.11 0.84  CALCIUM 10.5 9.9 9.9 9.5   Liver Function Tests: No results found for this basename: AST, ALT, ALKPHOS, BILITOT, PROT, ALBUMIN,  in the last 168 hours No results found for this basename: LIPASE, AMYLASE,  in the last 168 hours No results found for this basename: AMMONIA,  in the last 168 hours CBC:  Recent Labs Lab  03/03/13 0910 03/04/13 0515 03/05/13 0500  WBC 13.5* 11.8* 7.8  NEUTROABS 10.6*  --  4.6  HGB 11.6* 11.0* 9.9*  HCT 34.0* 33.2* 29.6*  MCV 91.4 92.2 91.9  PLT 271 248 273   Cardiac Enzymes: No results found for this basename: CKTOTAL, CKMB, CKMBINDEX, TROPONINI,  in the last 168 hours BNP: No components found with this basename: POCBNP,  CBG: No results found for this basename: GLUCAP,  in the last 168 hours  Time coordinating discharge:  Greater than 30 minutes  Signed:  Jyaire Koudelka, DO [facility] Pager: [phone] 03/06/2013, 11:05 AM

## 2013-03-06 NOTE — Progress Notes (Signed)
Discharge summary sent to payer through [facility]  

## 2013-03-11 ENCOUNTER — Telehealth: Payer: Self-pay | Admitting: *Deleted

## 2013-03-12 NOTE — Telephone Encounter (Signed)
Talked with patient and he saw urologist yesterday who removed a drainage tube.  Feels better since removing. He has 5 days remaining of cipro and he is compliant with his medication regimen. His appetite is returning and he is ambulating around the house without difficulty. Patient states that his collection catheter will remain for several weeks.  He has a appointment with dr Shawna Orleans on 03-19-12 at 1:30.  Patient is aware

## 2013-03-12 NOTE — Telephone Encounter (Signed)
Admit date: 03/03/2013  Discharge date: 03/06/2013  Recommendations for Outpatient Follow-up:  1. Pt will need to follow up with PCP in 2 weeks post discharge 2. Please obtain BMP to evaluate electrolytes and kidney function 3. Please also check CBC to evaluate Hg and Hct levels 4. Follow up with Dr. [name] in one week Discharge Diagnoses:  Principal Problem:  Pyelonephritis  Active Problems:  HYPERLIPIDEMIA  HYPERTENSION  Prostate cancer  Renal oncocytoma of right kidney  Right retroperitoneal urinoma with ureteral leak  Acute renal failure  Perinephric abscess  Pyelonephritis/perinephric abscess  CT scan done on 03/03/2012 showed 7.4 x 9.6 cm predominately simple appearing fluid collection adjacent to and inferior to the lower pole of the right kidney. Continue empiric antibiotics with Rocephin. Urine cultures from -03/03/13 and 03/04/13 were negative  -send patient home with ciprofloxacin 500mg  bid x 10 days which would finish 14 days of therapy. The patient had 4 days of intravenous ceftriaxone during the hospitalization.  -On the day of discharge, the patient was afebrile and hemodynamically stable.  -He has an appointment to followup with urology, Dr. [name] in one week  Active Problems:  HYPERLIPIDEMIA  Continue Zocor.  HYPERTENSION  Continue Azor.  Prostate cancer  Status post radical prostatectomy 01/2013 for [id number] Gleason 3+4=7 adenocarcinoma, margins negative. Was previously on surveillance for low-risk disease that had progressed by protocol-biopsy. PSA level to be checked in February.  -Followup with Dr. [name] in office  Renal oncocytoma of right kidney  Status post right robotic partial nephrectomy 01/2013 for 3.5cm lower pole enhancing mass, path confirmed benign oncocytoma, with no further evaluation recommended by urologist--Dr. [name]  Right retroperitoneal urinoma / ureteral leak / acute renals failure  Pt admitted 03/03/13 and found to have right retroperitoneal urinoma and  elevated Cr worrisome for urine leak from recent partial nephrectomy. He had minimal drain output and normal Cr when discharged postop. IR placed urinoma drain 03/04/13 and also rt nephrostomy + JJ ureteral stent, found to have extravasation from mid ureter (not at level of prior renorrhaphy). Drain currently functioning well with normalization of creatinine.  -Serum creatinine 0.84 on the day discharge  Discharge Condition:  Stable  Disposition: home  Diet: Cardiac  Wt Readings from Last 3 Encounters:   03/06/13  93.668 kg (206 lb 8 oz)   02/15/13  101.969 kg (224 lb 12.8 oz)   02/15/13  101.969 kg (224 lb 12.8 oz)   History of present illness:  Pt has hx of moderate risk prostate CA w/ concerning R renal neoplasm as well as HTN and HLD. underwents/p robotic rt partial nephrectomy + prostatectomy + adhesiolysis 02/15/2013,  by Dr [name]. He did suffer from mild post op ileus but this resolved. He does have hx of abd hernia repair w/ mesh. All pathology from the renal mass and surround nodes were negative for malignancy. His prostate bx showed gleason 3+4 = 7 prostate cancer. He was discharged w/ a foley catheter  2 days prior to admission in the outpt urology office, the foley was removed. Starting 1 day prior to admission, he starting feeling week and noticed fever today up to 101.5. He has not had trouble voiding and is difficulty to assess new dysuria given recent removal of foley catheter. He was stopping his ppx abx yesterday that were started to prevent infection around the removal of the foley catheter. He has no other complaints today other than postop abd pain that was assessed in urology clinic on Friday and I assume  was thought to be normal.  In the ED, he was afebrile but had leukocytosis of 13.5. U/A showed mod leuks and cx is pending. Pt was started on rocephin and [facility] was consulted and will see the patient. CT A/P w/o cx was ordered as well and will be performed soon

## 2013-03-13 ENCOUNTER — Other Ambulatory Visit (HOSPITAL_COMMUNITY): Payer: Self-pay | Admitting: Urology

## 2013-03-13 DIAGNOSIS — C61 Malignant neoplasm of prostate: Secondary | ICD-10-CM

## 2013-03-13 DIAGNOSIS — IMO0002 Reserved for concepts with insufficient information to code with codable children: Secondary | ICD-10-CM

## 2013-03-13 DIAGNOSIS — N476 Balanoposthitis: Secondary | ICD-10-CM

## 2013-03-18 ENCOUNTER — Ambulatory Visit (INDEPENDENT_AMBULATORY_CARE_PROVIDER_SITE_OTHER): Payer: Medicare Other | Admitting: Internal Medicine

## 2013-03-18 ENCOUNTER — Encounter: Payer: Self-pay | Admitting: Internal Medicine

## 2013-03-18 VITALS — BP 102/54 | HR 68 | Temp 97.6°F | Ht 72.0 in | Wt 198.0 lb

## 2013-03-18 DIAGNOSIS — D500 Iron deficiency anemia secondary to blood loss (chronic): Secondary | ICD-10-CM

## 2013-03-18 DIAGNOSIS — E785 Hyperlipidemia, unspecified: Secondary | ICD-10-CM

## 2013-03-18 DIAGNOSIS — I10 Essential (primary) hypertension: Secondary | ICD-10-CM

## 2013-03-18 DIAGNOSIS — IMO0002 Reserved for concepts with insufficient information to code with codable children: Secondary | ICD-10-CM

## 2013-03-18 DIAGNOSIS — N12 Tubulo-interstitial nephritis, not specified as acute or chronic: Secondary | ICD-10-CM

## 2013-03-18 DIAGNOSIS — N289 Disorder of kidney and ureter, unspecified: Secondary | ICD-10-CM

## 2013-03-18 DIAGNOSIS — D649 Anemia, unspecified: Secondary | ICD-10-CM

## 2013-03-18 DIAGNOSIS — N151 Renal and perinephric abscess: Secondary | ICD-10-CM

## 2013-03-18 LAB — CBC WITH DIFFERENTIAL/PLATELET
BASOS ABS: 0.1 10*3/uL (ref 0.0–0.1)
Basophils Relative: 0.7 % (ref 0.0–3.0)
Eosinophils Absolute: 0.6 10*3/uL (ref 0.0–0.7)
Eosinophils Relative: 7.1 % — ABNORMAL HIGH (ref 0.0–5.0)
HEMATOCRIT: 38.7 % — AB (ref 39.0–52.0)
HEMOGLOBIN: 12.8 g/dL — AB (ref 13.0–17.0)
LYMPHS ABS: 2.3 10*3/uL (ref 0.7–4.0)
LYMPHS PCT: 26.7 % (ref 12.0–46.0)
MCHC: 33 g/dL (ref 30.0–36.0)
MCV: 91.2 fl (ref 78.0–100.0)
MONOS PCT: 7.6 % (ref 3.0–12.0)
Monocytes Absolute: 0.7 10*3/uL (ref 0.1–1.0)
NEUTROS ABS: 5 10*3/uL (ref 1.4–7.7)
Neutrophils Relative %: 57.9 % (ref 43.0–77.0)
PLATELETS: 376 10*3/uL (ref 150.0–400.0)
RBC: 4.24 Mil/uL (ref 4.22–5.81)
RDW: 14.6 % (ref 11.5–14.6)
WBC: 8.6 10*3/uL (ref 4.5–10.5)

## 2013-03-18 LAB — BASIC METABOLIC PANEL
BUN: 21 mg/dL (ref 6–23)
CALCIUM: 10.4 mg/dL (ref 8.4–10.5)
CO2: 26 mEq/L (ref 19–32)
Chloride: 105 mEq/L (ref 96–112)
Creatinine, Ser: 1.1 mg/dL (ref 0.4–1.5)
GFR: 68.51 mL/min (ref >60.00)
Glucose, Bld: 106 mg/dL — ABNORMAL HIGH (ref 70–99)
POTASSIUM: 4 meq/L (ref 3.5–5.1)
SODIUM: 138 meq/L (ref 135–145)

## 2013-03-18 NOTE — Assessment & Plan Note (Signed)
Patient found to have urinoma after right robotic partial nephrectomy. Interventional radiology perform successful placement of double-J ureteral stent extending from the right renal pelvis the bladder as well as a placement of a 10 French nephrostomy "in the renal pelvis. His serum creatinine returned to baseline levels at hospital discharge.  Repeat BMET

## 2013-03-18 NOTE — Assessment & Plan Note (Signed)
Temporarily reduce simvastatin dose 20 mg once daily

## 2013-03-18 NOTE — Progress Notes (Signed)
Subjective:    Patient ID: David [name], male    DOB: 03-Feb-1941, 73 y.o.   MRN: [id number]  HPI  73 year old white male with history of prostate cancer and right renal tumor for hospital followup. Patient admitted on 14/2015 and discharged on 03/06/2013. Patient admitted with fever and found to have right perinephric abscess. This was a complication of partial nephrectomy for right renal tumor. Patient completed right robotic partial nephrectomy in December of 2014. Pathology report confirmed benign oncocytoma. Patient suffered from right retroperitoneal urinoma/ureteral leak and acute renal failure. Interventional radiology was consult and a drain was placed on 03/04/2013. Also right nephrostomy and JJ ureteral stent was placed. Fortunately his serum creatinine normalized and was 0.84 on day of discharge.  Since hospital discharge he denies any fever or chills. He complains of fatigue but he has postop anemia.  Hypertension-his blood pressure has been low normal. He currently takes Azor 5/20 mg once daily.  Hospital records reviewed in detail. Patient completed ciprofloxacin 500 mg twice a day for 10 days. Patient noted to have 4 days of intravenous ceftriaxone during hospitalization.  Patient underwent nephrostogram - the demonstrated a ureteral leak into the neuroma and ureteral traction just beyond the ureteral defect. The defect occurs approximately 10 cm beyond the ureteropelvic junction. Patient had successful placement of double-J ureteral stent extending from the right renal pelvis to the bladder as well as of a 10 French nephrostomy coiled in the right renal pelvis.  Review of Systems Negative for fever or chills,  Normal color to urine from drainage tube    Past Medical History  Diagnosis Date  . Hyperlipidemia   . Hypertension   . LBP (low back pain) 06/2005    L5 radicular symptoms; MRI of LS spine severe spondylosis at L4-L5 with central cancal stenosis   . History of  kidney stones     2007  . Spermatocele   . BPH (benign prostatic hypertrophy)   . Organic impotence   . Nocturia   . Arthritis     gen.  and left hip  . Wears glasses   . Prostate cancer 10/2011    (Low grade) [facility] - Dr. Junious Silk  . GERD (gastroesophageal reflux disease)     occasional  . Kidney tumor   . Pyelonephritis 03/05/2013  . Renal oncocytoma of right kidney 01/23/2013    As 3.2 x 3.5 cm enhancing exophytic mass projecting off the lower pole. This is consistent with a renal cell carcinoma. No retroperitoneal lymphadenopathy.   . 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 Marital Status: Married    Spouse Name: N/A    Number of Children: N/A  . Years of Education: N/A   Occupational History  . Not on file.   Social History Main Topics  . Smoking status: Former Smoker -- 0.25 packs/day for 10 years    Types: Cigarettes    Quit date: 02/28/1981  . Smokeless tobacco: Never Used  . Alcohol Use: 0.0 oz/week     Comment: occasional  . Drug Use: No  . Sexual Activity: Not on file   Other Topics Concern  . Not on file   Social History Narrative   Married- 58 yrs   2 daughters   5 grandchildren   Never Smoked   Alcohol use-yes (occasional/social)   Retired- Engineer, water    Past Surgical History  Procedure Laterality Date  . Colonoscopy  02/15/2006, 2014  Internal hemorrhoids (Dr [name]), last in 2014  . Repair umbilical and ventral hernia's w/ mesh  08-21-2009  . Transurethral resection of prostate  AGE 67  . Tonsillectomy and adenoidectomy  as child  . Inguinal hernia repair Right 1980  . Spermatocelectomy Left 12/18/2012    Procedure: LEFT SPERMATOCELECTOMY;  Surgeon: [name], MD;  Location: [facility];  Service: Urology;  Laterality: Left;  . Prostate biopsy N/A 12/18/2012    Procedure: BIOPSY TRANSRECTAL ULTRASONIC PROSTATE (TUBP);  Surgeon: [name], MD;  Location: [facility];  Service: Urology;  Laterality: N/A;  . Prostate biopsy  12/05/11    gleason 6, 3/12 cores  . Robotic assited partial nephrectomy Right 02/15/2013    Procedure: ROBOTIC ASSITED PARTIAL NEPHRECTOMY;  Surgeon: [name], MD;  Location: [facility];  Service: Urology;  Laterality: Right;  . Robot assisted laparoscopic radical prostatectomy N/A 02/15/2013    Procedure: ROBOTIC ASSISTED LAPAROSCOPIC RADICAL PROSTATECTOMY;  Surgeon: [name], MD;  Location: [facility];  Service: Urology;  Laterality: N/A;  . Lymphadenectomy Bilateral 02/15/2013    Procedure: LYMPHADENECTOMY WITH INDOCYANINE GREEN DYE INJECTION;  Surgeon: [name], MD;  Location: [facility];  Service: Urology;  Laterality: Bilateral;    Family History  Problem Relation Age of Onset  . Cancer Mother     colon  . Colon cancer Mother 24  . Pulmonary fibrosis Father     father owned a Radio broadcast assistant (father blamed inhalation of chemicals and fumes)  . Coronary artery disease Other   . Hyperlipidemia Other     Allergies  Allergen Reactions  . Ace Inhibitors Other (See Comments)    REACTION: Cough  . Codeine     Severe hiccups  . Percocet [Oxycodone-Acetaminophen]     Severe hiccups    Current Outpatient Prescriptions on File Prior to Visit  Medication Sig Dispense Refill  . acetaminophen (TYLENOL) 500 MG tablet Take 1,000 mg by mouth every 6 (six) hours as needed for moderate pain.      [name] amLODipine-olmesartan (AZOR) 5-20 MG per tablet Take 1 tablet by mouth every evening.      [name] aspirin EC 81 MG tablet Take 81 mg by mouth daily.      . Multiple Vitamin (MULTIVITAMIN WITH MINERALS) TABS tablet Take 1 tablet by mouth daily.      [name] omeprazole (PRILOSEC) 20 MG capsule Take 20 mg by mouth every evening.      . senna-docusate (SENOKOT-S) 8.6-50 MG per tablet Take 1 tablet by mouth 2 (two) times daily. While taking pain meds to prevent constipation  30 tablet  0  . simvastatin (ZOCOR) 40 MG tablet take 1  tablet by mouth at bedtime  90 tablet  1  . sodium chloride (OCEAN) 0.65 % nasal spray Place 1 spray into the nose daily as needed for congestion.      . mirabegron ER (MYRBETRIQ) 25 MG TB24 tablet Take 1 tablet (25 mg total) by mouth daily.  30 tablet  0   No current facility-administered medications on file prior to visit.    BP 102/54  Pulse 68  Temp(Src) 97.6 F (36.4 C) (Oral)  Ht 6' (1.829 m)  Wt 198 lb (89.812 kg)  BMI 26.85 kg/m2    Objective:   Physical Exam  Constitutional: He is oriented to person, place, and time. He appears well-developed and well-nourished.  HENT:  Head: Normocephalic and atraumatic.  Mouth/Throat: Oropharynx is clear and moist.  Eyes:  Pale conjunctiva  Cardiovascular: Normal rate, regular rhythm and normal heart sounds.   Pulmonary/Chest: Breath sounds normal. He has no wheezes.  Abdominal: Soft. Bowel sounds are normal.  Small abdominal incision sites look clean and dry.  Minimal redness  Musculoskeletal: He exhibits no edema.  Neurological: He is alert and oriented to person, place, and time.  Skin: Skin is warm and dry.  Psychiatric: He has a normal mood and affect. His behavior is normal.          Assessment & Plan:

## 2013-03-18 NOTE — Assessment & Plan Note (Signed)
Patient's blood pressure is low normal. Reduce current Azor dose to half tablet daily. Patient advised to monitor his blood pressure at home.

## 2013-03-18 NOTE — Assessment & Plan Note (Signed)
Follow up with Dr. [name] as directed

## 2013-03-18 NOTE — Progress Notes (Signed)
Pre visit review using our clinic review tool, if applicable. No additional management support is needed unless otherwise documented below in the visit note. 

## 2013-03-18 NOTE — Assessment & Plan Note (Signed)
Monitor for now.  Increase intake of high iron foods.

## 2013-03-18 NOTE — Assessment & Plan Note (Signed)
Patient was treated with ceftriaxone during hospitalization and finished 10 day course of Cipro 500 mg twice daily. He is afebrile and appears nontoxic. Monitor CBC with differential

## 2013-03-28 ENCOUNTER — Other Ambulatory Visit: Payer: Medicare Other | Admitting: Lab

## 2013-03-28 ENCOUNTER — Ambulatory Visit: Payer: Medicare Other

## 2013-03-28 ENCOUNTER — Ambulatory Visit: Payer: Medicare Other | Admitting: Hematology & Oncology

## 2013-04-03 ENCOUNTER — Telehealth: Payer: Self-pay | Admitting: Internal Medicine

## 2013-04-03 NOTE — Telephone Encounter (Signed)
Relevant patient education mailed to patient.  

## 2013-04-11 ENCOUNTER — Ambulatory Visit (HOSPITAL_COMMUNITY)
Admission: RE | Admit: 2013-04-11 | Discharge: 2013-04-11 | Disposition: A | Discharge status: Home / Self Care | Payer: Medicare Other | Source: Ambulatory Visit | Attending: Urology | Admitting: Urology

## 2013-04-11 DIAGNOSIS — N476 Balanoposthitis: Secondary | ICD-10-CM

## 2013-04-11 DIAGNOSIS — IMO0002 Reserved for concepts with insufficient information to code with codable children: Secondary | ICD-10-CM

## 2013-04-11 DIAGNOSIS — Z436 Encounter for attention to other artificial openings of urinary tract: Secondary | ICD-10-CM | POA: Insufficient documentation

## 2013-04-11 DIAGNOSIS — C61 Malignant neoplasm of prostate: Secondary | ICD-10-CM | POA: Insufficient documentation

## 2013-04-11 MED ORDER — IOHEXOL 300 MG/ML  SOLN
20.0000 mL | Freq: Once | INTRAMUSCULAR | Status: AC | PRN
  Administered 2013-04-11: 10 mL via INTRAVENOUS

## 2013-04-29 ENCOUNTER — Ambulatory Visit: Payer: Medicare Other | Admitting: Internal Medicine

## 2013-05-06 ENCOUNTER — Ambulatory Visit (INDEPENDENT_AMBULATORY_CARE_PROVIDER_SITE_OTHER): Payer: Medicare Other | Admitting: Internal Medicine

## 2013-05-06 ENCOUNTER — Encounter: Payer: Self-pay | Admitting: Internal Medicine

## 2013-05-06 VITALS — BP 130/72 | HR 68 | Temp 98.1°F | Ht 72.0 in | Wt 200.0 lb

## 2013-05-06 DIAGNOSIS — E785 Hyperlipidemia, unspecified: Secondary | ICD-10-CM

## 2013-05-06 DIAGNOSIS — IMO0002 Reserved for concepts with insufficient information to code with codable children: Secondary | ICD-10-CM

## 2013-05-06 DIAGNOSIS — I10 Essential (primary) hypertension: Secondary | ICD-10-CM

## 2013-05-06 LAB — BASIC METABOLIC PANEL
BUN: 24 mg/dL — ABNORMAL HIGH (ref 6–23)
CHLORIDE: 111 meq/L (ref 96–112)
CO2: 24 mEq/L (ref 19–32)
Calcium: 10.1 mg/dL (ref 8.4–10.5)
Creatinine, Ser: 0.9 mg/dL (ref 0.4–1.5)
GFR: 85.93 mL/min (ref >60.00)
Glucose, Bld: 128 mg/dL — ABNORMAL HIGH (ref 70–99)
Potassium: 3.9 mEq/L (ref 3.5–5.1)
SODIUM: 142 meq/L (ref 135–145)

## 2013-05-06 MED ORDER — AMLODIPINE-OLMESARTAN 5-20 MG PO TABS: 0.5000 | ORAL_TABLET | Freq: Every evening | ORAL | Status: DC

## 2013-05-06 MED ORDER — SIMVASTATIN 40 MG PO TABS: 20.0000 mg | ORAL_TABLET | Freq: Every day | ORAL | Status: DC

## 2013-05-06 NOTE — Patient Instructions (Signed)
Please complete the following lab tests before your next follow up appointment: BMET - 401.9 FLP, LFTs, TSH - 272.4 

## 2013-05-06 NOTE — Assessment & Plan Note (Signed)
Continue lower dose of simvastatin.  Monitor LFTs.

## 2013-05-06 NOTE — Assessment & Plan Note (Signed)
Blood pressure stable on half dose of Azor. His fatigue symptoms improved. Monitor electrolytes and kidney function. BP: 130/72 mmHg

## 2013-05-06 NOTE — Assessment & Plan Note (Addendum)
Patient had stent removed. Ureteral leak resolved. Patient has had 2 recent episodes of urinary tract infections. Hopefully this will resolve as ureteral stent was removed.  Continue follow up with urologist.

## 2013-05-06 NOTE — Progress Notes (Signed)
Subjective:    Patient ID: David [name], male    DOB: Jan 14, 1941, 73 y.o.   MRN: [id number]  HPI  73 year old white male with history of prostate cancer, nephrolithiasis, right renal oncocytoma, status post robotic radical prostatectomy and right renal urinoma/mid ureteral leak for followup. Interval medical history - patient was seen by his urologist. His ureteral stent was removed. Patient was treated for urinary tract infection in mid February. At time of recent stent removal patient had abnormal urinalysis and patient prescribed antibiotic for 4 days.  Patient denies any fever chills, flank pain or urinary frequency.  Hypertension-fatigue level significantly improved since taking lower dose of Azor.  Review of Systems He has resumed usual household activities,  No chest pain.  Less fatigue.    Past Medical History  Diagnosis Date  . Hyperlipidemia   . Hypertension   . LBP (low back pain) 06/2005    L5 radicular symptoms; MRI of LS spine severe spondylosis at L4-L5 with central cancal stenosis   . History of kidney stones     2007  . Spermatocele   . BPH (benign prostatic hypertrophy)   . Organic impotence   . Nocturia   . Arthritis     gen.  and left hip  . Wears glasses   . Prostate cancer 10/2011    (Low grade) [facility] - Dr. Junious Silk  . GERD (gastroesophageal reflux disease)     occasional  . Kidney tumor   . Pyelonephritis 03/05/2013  . Renal oncocytoma of right kidney 01/23/2013    As 3.2 x 3.5 cm enhancing exophytic mass projecting off the lower pole. This is consistent with a renal cell carcinoma. No retroperitoneal lymphadenopathy.   . 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 Marital Status: Married    Spouse Name: N/A    Number of Children: N/A  . Years of Education: N/A   Occupational History  . Not on file.   Social History Main Topics  . Smoking status: Former Smoker -- 0.25 packs/day for 10 years    Types: Cigarettes    Quit date:  02/28/1981  . Smokeless tobacco: Never Used  . Alcohol Use: 0.0 oz/week     Comment: occasional  . Drug Use: No  . Sexual Activity: Not on file   Other Topics Concern  . Not on file   Social History Narrative   Married- 73 yrs   2 daughters   5 grandchildren   Never Smoked   Alcohol use-yes (occasional/social)   Retired- Engineer, water    Past Surgical History  Procedure Laterality Date  . Colonoscopy  02/15/2006, 2014    Internal hemorrhoids (Dr [name]), last in 2014  . Repair umbilical and ventral hernia's w/ mesh  08-21-2009  . Transurethral resection of prostate  AGE 76  . Tonsillectomy and adenoidectomy  as child  . Inguinal hernia repair Right 1980  . Spermatocelectomy Left 12/18/2012    Procedure: LEFT SPERMATOCELECTOMY;  Surgeon: [name], MD;  Location: [facility];  Service: Urology;  Laterality: Left;  . Prostate biopsy N/A 12/18/2012    Procedure: BIOPSY TRANSRECTAL ULTRASONIC PROSTATE (TUBP);  Surgeon: [name], MD;  Location: [facility];  Service: Urology;  Laterality: N/A;  . Prostate biopsy  12/05/11    gleason 6, 3/12 cores  . Robotic assited partial nephrectomy Right 02/15/2013    Procedure: ROBOTIC ASSITED PARTIAL NEPHRECTOMY;  Surgeon: [name], MD;  Location: [facility];  Service: Urology;  Laterality: Right;  . Robot assisted laparoscopic radical prostatectomy N/A 02/15/2013    Procedure: ROBOTIC ASSISTED LAPAROSCOPIC RADICAL PROSTATECTOMY;  Surgeon: [name], MD;  Location: [facility];  Service: Urology;  Laterality: N/A;  . Lymphadenectomy Bilateral 02/15/2013    Procedure: LYMPHADENECTOMY WITH INDOCYANINE GREEN DYE INJECTION;  Surgeon: [name], MD;  Location: [facility];  Service: Urology;  Laterality: Bilateral;    Family History  Problem Relation Age of Onset  . Cancer Mother     colon  . Colon cancer Mother 103  . Pulmonary fibrosis Father     father owned a [employer] (father blamed inhalation of chemicals and fumes)  . Coronary artery disease Other   . Hyperlipidemia Other     Allergies  Allergen Reactions  . Ace Inhibitors Other (See Comments)    REACTION: Cough  . Codeine     Severe hiccups  . Percocet [Oxycodone-Acetaminophen]     Severe hiccups    Current Outpatient Prescriptions on File Prior to Visit  Medication Sig Dispense Refill  . acetaminophen (TYLENOL) 500 MG tablet Take 1,000 mg by mouth every 6 (six) hours as needed for moderate pain.      [name] amLODipine-olmesartan (AZOR) 5-20 MG per tablet Take 0.5 tablets by mouth every evening.      [name] aspirin EC 81 MG tablet Take 81 mg by mouth daily.      . Multiple Vitamin (MULTIVITAMIN WITH MINERALS) TABS tablet Take 1 tablet by mouth daily.      [name] omeprazole (PRILOSEC) 20 MG capsule Take 20 mg by mouth every evening.      . simvastatin (ZOCOR) 40 MG tablet take 1 tablet by mouth at bedtime  90 tablet  1  . sodium chloride (OCEAN) 0.65 % nasal spray Place 1 spray into the nose daily as needed for congestion.       No current facility-administered medications on file prior to visit.    BP 130/72  Pulse 68  Temp(Src) 98.1 F (36.7 C) (Oral)  Ht 6' (1.829 m)  Wt 200 lb (90.719 kg)  BMI 27.12 kg/m2    Objective:   Physical Exam  Constitutional: He is oriented to person, place, and time. He appears well-developed and well-nourished. No distress.  Cardiovascular: Normal rate, regular rhythm and normal heart sounds.   Pulmonary/Chest: Effort normal and breath sounds normal. He has no wheezes.  Musculoskeletal: He exhibits no edema.  Neurological: He is alert and oriented to person, place, and time.  Psychiatric: He has a normal mood and affect. His behavior is normal.          Assessment & Plan:

## 2013-05-06 NOTE — Progress Notes (Signed)
Pre visit review using our clinic review tool, if applicable. No additional management support is needed unless otherwise documented below in the visit note. 

## 2013-05-07 ENCOUNTER — Telehealth: Payer: Self-pay | Admitting: Internal Medicine

## 2013-05-07 NOTE — Telephone Encounter (Signed)
Relevant patient education assigned to patient using Emmi. ° °

## 2013-06-07 ENCOUNTER — Encounter: Payer: Self-pay | Admitting: Internal Medicine

## 2013-06-07 ENCOUNTER — Ambulatory Visit (INDEPENDENT_AMBULATORY_CARE_PROVIDER_SITE_OTHER): Payer: Medicare Other | Admitting: Internal Medicine

## 2013-06-07 ENCOUNTER — Ambulatory Visit (INDEPENDENT_AMBULATORY_CARE_PROVIDER_SITE_OTHER)
Admission: RE | Admit: 2013-06-07 | Discharge: 2013-06-07 | Disposition: A | Discharge status: Home / Self Care | Payer: Medicare Other | Source: Ambulatory Visit | Attending: Internal Medicine | Admitting: Internal Medicine

## 2013-06-07 VITALS — BP 130/68 | HR 85 | Temp 98.2°F | Ht 72.0 in | Wt 201.0 lb

## 2013-06-07 DIAGNOSIS — R059 Cough, unspecified: Secondary | ICD-10-CM

## 2013-06-07 DIAGNOSIS — M6281 Muscle weakness (generalized): Secondary | ICD-10-CM

## 2013-06-07 DIAGNOSIS — R05 Cough: Secondary | ICD-10-CM

## 2013-06-07 DIAGNOSIS — R5383 Other fatigue: Secondary | ICD-10-CM

## 2013-06-07 DIAGNOSIS — R350 Frequency of micturition: Secondary | ICD-10-CM

## 2013-06-07 DIAGNOSIS — R5381 Other malaise: Secondary | ICD-10-CM

## 2013-06-07 DIAGNOSIS — E785 Hyperlipidemia, unspecified: Secondary | ICD-10-CM

## 2013-06-07 LAB — POCT URINALYSIS DIPSTICK
Bilirubin, UA: NEGATIVE
GLUCOSE UA: NEGATIVE
KETONES UA: NEGATIVE
Nitrite, UA: NEGATIVE
Protein, UA: NEGATIVE
RBC UA: NEGATIVE
Spec Grav, UA: 1.025
Urobilinogen, UA: 0.2
pH, UA: 5.5

## 2013-06-07 LAB — CBC WITH DIFFERENTIAL/PLATELET
BASOS ABS: 0 10*3/uL (ref 0.0–0.1)
Basophils Relative: 0.5 % (ref 0.0–3.0)
EOS ABS: 0.1 10*3/uL (ref 0.0–0.7)
Eosinophils Relative: 1.4 % (ref 0.0–5.0)
HEMATOCRIT: 38.1 % — AB (ref 39.0–52.0)
HEMOGLOBIN: 12.6 g/dL — AB (ref 13.0–17.0)
LYMPHS ABS: 2 10*3/uL (ref 0.7–4.0)
Lymphocytes Relative: 20.1 % (ref 12.0–46.0)
MCHC: 33.1 g/dL (ref 30.0–36.0)
MCV: 89.7 fl (ref 78.0–100.0)
MONO ABS: 1 10*3/uL (ref 0.1–1.0)
MONOS PCT: 10.5 % (ref 3.0–12.0)
NEUTROS ABS: 6.6 10*3/uL (ref 1.4–7.7)
Neutrophils Relative %: 67.5 % (ref 43.0–77.0)
Platelets: 256 10*3/uL (ref 150.0–400.0)
RBC: 4.25 Mil/uL (ref 4.22–5.81)
RDW: 15.1 % — AB (ref 11.5–14.6)
WBC: 9.8 10*3/uL (ref 4.5–10.5)

## 2013-06-07 LAB — BASIC METABOLIC PANEL
BUN: 23 mg/dL (ref 6–23)
CHLORIDE: 109 meq/L (ref 96–112)
CO2: 25 meq/L (ref 19–32)
Calcium: 10.4 mg/dL (ref 8.4–10.5)
Creatinine, Ser: 0.9 mg/dL (ref 0.4–1.5)
GFR: 89.26 mL/min (ref >60.00)
Glucose, Bld: 100 mg/dL — ABNORMAL HIGH (ref 70–99)
POTASSIUM: 4.1 meq/L (ref 3.5–5.1)
SODIUM: 139 meq/L (ref 135–145)

## 2013-06-07 LAB — CK: Total CK: 86 U/L (ref 7–232)

## 2013-06-07 MED ORDER — FLUTICASONE PROPIONATE 50 MCG/ACT NA SUSP: 2.0000 | Freq: Every day | NASAL | Status: DC

## 2013-06-07 MED ORDER — DESLORATADINE 5 MG PO TABS: 5.0000 mg | ORAL_TABLET | Freq: Every day | ORAL | Status: DC

## 2013-06-07 NOTE — Assessment & Plan Note (Signed)
73 year old white male with chronic dry cough for 3 weeks. I suspect his symptoms likely secondary to flare of allergic rhinitis. Continue intranasal steroids, intranasal saline and nonsedating antihistamine. Obtain chest x-ray considering constitutional symptoms.

## 2013-06-07 NOTE — Patient Instructions (Signed)
Our office will contact you re: Chest x ray and blood test results  Hold simvastatin for now

## 2013-06-07 NOTE — Assessment & Plan Note (Signed)
73 year old with generalized fatigue since his renal surgery and robotic radical prostatectomy. Check complete blood count with differential, basic metabolic panel, CXR and CPK.

## 2013-06-07 NOTE — Progress Notes (Signed)
Pre visit review using our clinic review tool, if applicable. No additional management support is needed unless otherwise documented below in the visit note. 

## 2013-06-07 NOTE — Assessment & Plan Note (Signed)
Unclear whether patient's fatigue symptoms related to statin use. Hold simvastatin for now. Obtain CPK level

## 2013-06-07 NOTE — Progress Notes (Signed)
Subjective:    Patient ID: David [name], male    DOB: 1940/07/16, 73 y.o.   MRN: [id number]  HPI  73 year old white male with history of prostate cancer, benign right kidney tumor, and hypertension complains of dry cough and fatigue for the last 3 weeks. Patient's symptoms started while he was vacationing near [employer]. His cough is nonproductive and he has associated watery and itchy eyes. He has tried over-the-counter Flonase, generic claritin and Delsym cough syrup with some relief.  Patient complains that he has had very little energy since his surgery. Patient tried to lift bags of mulch but felt too tired to complete his task. He has mild shortness of breath but denies any chest pain.   Review of Systems Urinary frequency but no dysuria    Past Medical History  Diagnosis Date  . Hyperlipidemia   . Hypertension   . LBP (low back pain) 06/2005    L5 radicular symptoms; MRI of LS spine severe spondylosis at L4-L5 with central cancal stenosis   . History of kidney stones     2007  . Spermatocele   . BPH (benign prostatic hypertrophy)   . Organic impotence   . Nocturia   . Arthritis     gen.  and left hip  . Wears glasses   . Prostate cancer 10/2011    (Low grade) [facility] - Dr. Junious Silk  . GERD (gastroesophageal reflux disease)     occasional  . Kidney tumor   . Pyelonephritis 03/05/2013  . Renal oncocytoma of right kidney 01/23/2013    As 3.2 x 3.5 cm enhancing exophytic mass projecting off the lower pole. This is consistent with a renal cell carcinoma. No retroperitoneal lymphadenopathy.   . 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 Marital Status: Married    Spouse Name: N/A    Number of Children: N/A  . Years of Education: N/A   Occupational History  . Not on file.   Social History Main Topics  . Smoking status: Former Smoker -- 0.25 packs/day for 10 years    Types: Cigarettes    Quit date: 02/28/1981  . Smokeless tobacco:  Never Used  . Alcohol Use: 0.0 oz/week     Comment: occasional  . Drug Use: No  . Sexual Activity: Not on file   Other Topics Concern  . Not on file   Social History Narrative   Married- 96 yrs   2 daughters   5 grandchildren   Never Smoked   Alcohol use-yes (occasional/social)   Retired- Engineer, water    Past Surgical History  Procedure Laterality Date  . Colonoscopy  02/15/2006, 2014    Internal hemorrhoids (Dr [name]), last in 2014  . Repair umbilical and ventral hernia's w/ mesh  08-21-2009  . Transurethral resection of prostate  AGE 27  . Tonsillectomy and adenoidectomy  as child  . Inguinal hernia repair Right 1980  . Spermatocelectomy Left 12/18/2012    Procedure: LEFT SPERMATOCELECTOMY;  Surgeon: [name], MD;  Location: [facility];  Service: Urology;  Laterality: Left;  . Prostate biopsy N/A 12/18/2012    Procedure: BIOPSY TRANSRECTAL ULTRASONIC PROSTATE (TUBP);  Surgeon: [name], MD;  Location: [facility];  Service: Urology;  Laterality: N/A;  . Prostate biopsy  12/05/11    gleason 6, 3/12 cores  . Robotic assited partial nephrectomy Right 02/15/2013    Procedure: ROBOTIC ASSITED PARTIAL NEPHRECTOMY;  Surgeon: [name], MD;  Location: [facility];  Service: Urology;  Laterality: Right;  . Robot assisted laparoscopic radical prostatectomy N/A 02/15/2013    Procedure: ROBOTIC ASSISTED LAPAROSCOPIC RADICAL PROSTATECTOMY;  Surgeon: [name], MD;  Location: [facility];  Service: Urology;  Laterality: N/A;  . Lymphadenectomy Bilateral 02/15/2013    Procedure: LYMPHADENECTOMY WITH INDOCYANINE GREEN DYE INJECTION;  Surgeon: [name], MD;  Location: [facility];  Service: Urology;  Laterality: Bilateral;    Family History  Problem Relation Age of Onset  . Cancer Mother     colon  . Colon cancer Mother 38  . Pulmonary fibrosis Father     father owned a Radio broadcast assistant (father blamed inhalation  of chemicals and fumes)  . Coronary artery disease Other   . Hyperlipidemia Other     Allergies  Allergen Reactions  . Ace Inhibitors Other (See Comments)    REACTION: Cough  . Codeine     Severe hiccups  . Percocet [Oxycodone-Acetaminophen]     Severe hiccups    Current Outpatient Prescriptions on File Prior to Visit  Medication Sig Dispense Refill  . acetaminophen (TYLENOL) 500 MG tablet Take 1,000 mg by mouth every 6 (six) hours as needed for moderate pain.      [name] amLODipine-olmesartan (AZOR) 5-20 MG per tablet Take 0.5 tablets by mouth every evening.  45 tablet  1  . aspirin EC 81 MG tablet Take 81 mg by mouth daily.      [name] omeprazole (PRILOSEC) 20 MG capsule Take 20 mg by mouth daily as needed.       . simvastatin (ZOCOR) 40 MG tablet Take 0.5 tablets (20 mg total) by mouth daily at 6 PM.  90 tablet  1  . sodium chloride (OCEAN) 0.65 % nasal spray Place 1 spray into the nose daily as needed for congestion.       No current facility-administered medications on file prior to visit.    BP 130/68  Pulse 85  Temp(Src) 98.2 F (36.8 C) (Oral)  Ht 6' (1.829 m)  Wt 201 lb (91.173 kg)  BMI 27.25 kg/m2  SpO2 97%    Objective:   Physical Exam  Constitutional: He is oriented to person, place, and time. He appears well-developed and well-nourished.  HENT:  Head: Normocephalic and atraumatic.  Right Ear: External ear normal.  Left Ear: External ear normal.  Mild oropharyngeal erythema  Neck: Neck supple.  Cardiovascular: Normal rate, regular rhythm and normal heart sounds.   Pulmonary/Chest: Effort normal.  Question faint coarse breath sounds at left base  Abdominal: Soft. Bowel sounds are normal.  Mild right lower quadrant/right middle quadrant tenderness  Musculoskeletal: He exhibits no edema.  Lymphadenopathy:    He has no cervical adenopathy.  Neurological: He is alert and oriented to person, place, and time. No cranial nerve deficit.  Skin: Skin is warm and dry.    Psychiatric: He has a normal mood and affect. His behavior is normal.          Assessment & Plan:

## 2013-06-11 ENCOUNTER — Other Ambulatory Visit: Payer: Self-pay

## 2013-06-11 DIAGNOSIS — R911 Solitary pulmonary nodule: Secondary | ICD-10-CM

## 2013-06-12 ENCOUNTER — Telehealth: Payer: Self-pay | Admitting: Internal Medicine

## 2013-06-12 NOTE — Telephone Encounter (Signed)
Need pre cert for ct of chest. appt is 4/16 at 9:30am.

## 2013-06-13 ENCOUNTER — Encounter (HOSPITAL_COMMUNITY): Payer: Self-pay

## 2013-06-13 ENCOUNTER — Ambulatory Visit (HOSPITAL_COMMUNITY)
Admission: RE | Admit: 2013-06-13 | Discharge: 2013-06-13 | Disposition: A | Discharge status: Home / Self Care | Payer: Medicare Other | Source: Ambulatory Visit | Attending: Internal Medicine | Admitting: Internal Medicine

## 2013-06-13 DIAGNOSIS — R911 Solitary pulmonary nodule: Secondary | ICD-10-CM | POA: Insufficient documentation

## 2013-06-13 MED ORDER — IOHEXOL 300 MG/ML  SOLN
80.0000 mL | Freq: Once | INTRAMUSCULAR | Status: AC | PRN
  Administered 2013-06-13: 80 mL via INTRAVENOUS

## 2013-06-14 NOTE — Telephone Encounter (Signed)
Pt is calling requesting result of ct scan done at [facility] on 06-13-13

## 2013-06-14 NOTE — Telephone Encounter (Signed)
See result note.  

## 2013-07-05 ENCOUNTER — Ambulatory Visit (INDEPENDENT_AMBULATORY_CARE_PROVIDER_SITE_OTHER): Payer: Medicare Other | Admitting: Internal Medicine

## 2013-07-05 ENCOUNTER — Encounter: Payer: Self-pay | Admitting: Internal Medicine

## 2013-07-05 VITALS — BP 120/62 | Temp 98.7°F | Wt 202.0 lb

## 2013-07-05 DIAGNOSIS — R911 Solitary pulmonary nodule: Secondary | ICD-10-CM | POA: Insufficient documentation

## 2013-07-05 DIAGNOSIS — I10 Essential (primary) hypertension: Secondary | ICD-10-CM

## 2013-07-05 DIAGNOSIS — E785 Hyperlipidemia, unspecified: Secondary | ICD-10-CM

## 2013-07-05 MED ORDER — ATORVASTATIN CALCIUM 10 MG PO TABS: 10.0000 mg | ORAL_TABLET | Freq: Every day | ORAL | Status: DC

## 2013-07-05 NOTE — Progress Notes (Signed)
Subjective:    Patient ID: David [name], male    DOB: [birth year], 73 y.o.   MRN: [id number]  HPI  73 year old white male with history of prostate cancer, hypertension and hyperlipidemia for followup. Patient previously seen for nagging cough for one month. Chest x-ray was ordered which showed possible right upper lobe nodule. Followup CT scan did not reveal anything in the right upper lobe area but did show 5 mm nodule in right lower lobe. He last smoked in 1983. He was not a heavy smoker. He smoked socially.  He denies chronic cough or weight loss.  Hyperlipidemia-patient has been holding simvastatin due to complaints of fatigue. His fatigue has improved. Unclear whether improvement in symptoms secondary to cessation of medication versus patient finally recovering from his radical prostatectomy.  Review of Systems Negative for chest pain or shortness of breath.  He has been working in his yard as usual.    Past Medical History  Diagnosis Date  . Hyperlipidemia   . Hypertension   . LBP (low back pain) 06/2005    L5 radicular symptoms; MRI of LS spine severe spondylosis at L4-L5 with central cancal stenosis   . History of kidney stones     2007  . Spermatocele   . BPH (benign prostatic hypertrophy)   . Organic impotence   . Nocturia   . Arthritis     gen.  and left hip  . Wears glasses   . Prostate cancer 10/2011    (Low grade) [facility] - Dr. Junious Silk  . GERD (gastroesophageal reflux disease)     occasional  . Kidney tumor   . Pyelonephritis 03/05/2013  . Renal oncocytoma of right kidney 01/23/2013    As 3.2 x 3.5 cm enhancing exophytic mass projecting off the lower pole. This is consistent with a renal cell carcinoma. No retroperitoneal lymphadenopathy.   . 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 Marital Status: Married    Spouse Name: N/A    Number of Children: N/A  . Years of Education: N/A   Occupational History  . Not on file.   Social History  Main Topics  . Smoking status: Former Smoker -- 0.25 packs/day for 10 years    Types: Cigarettes    Quit date: 02/28/1981  . Smokeless tobacco: Never Used  . Alcohol Use: 0.0 oz/week     Comment: occasional  . Drug Use: No  . Sexual Activity: Not on file   Other Topics Concern  . Not on file   Social History Narrative   Married- 19 yrs   2 daughters   5 grandchildren   Never Smoked   Alcohol use-yes (occasional/social)   Retired- Engineer, water    Past Surgical History  Procedure Laterality Date  . Colonoscopy  02/15/2006, 2014    Internal hemorrhoids (Dr [name]), last in 2014  . Repair umbilical and ventral hernia's w/ mesh  08-21-2009  . Transurethral resection of prostate  AGE 73  . Tonsillectomy and adenoidectomy  as child  . Inguinal hernia repair Right 1980  . Spermatocelectomy Left 12/18/2012    Procedure: LEFT SPERMATOCELECTOMY;  Surgeon: [name], MD;  Location: [facility];  Service: Urology;  Laterality: Left;  . Prostate biopsy N/A 12/18/2012    Procedure: BIOPSY TRANSRECTAL ULTRASONIC PROSTATE (TUBP);  Surgeon: [name], MD;  Location: [facility];  Service: Urology;  Laterality: N/A;  . Prostate biopsy  12/05/11  gleason 6, 3/12 cores  . Robotic assited partial nephrectomy Right 02/15/2013    Procedure: ROBOTIC ASSITED PARTIAL NEPHRECTOMY;  Surgeon: [name], MD;  Location: [facility];  Service: Urology;  Laterality: Right;  . Robot assisted laparoscopic radical prostatectomy N/A 02/15/2013    Procedure: ROBOTIC ASSISTED LAPAROSCOPIC RADICAL PROSTATECTOMY;  Surgeon: [name], MD;  Location: [facility];  Service: Urology;  Laterality: N/A;  . Lymphadenectomy Bilateral 02/15/2013    Procedure: LYMPHADENECTOMY WITH INDOCYANINE GREEN DYE INJECTION;  Surgeon: [name], MD;  Location: [facility];  Service: Urology;  Laterality: Bilateral;    Family History  Problem Relation Age of Onset    . Cancer Mother     colon  . Colon cancer Mother 51  . Pulmonary fibrosis Father     father owned a Radio broadcast assistant (father blamed inhalation of chemicals and fumes)  . Coronary artery disease Other   . Hyperlipidemia Other     Allergies  Allergen Reactions  . Ace Inhibitors Other (See Comments)    REACTION: Cough  . Codeine     Severe hiccups  . Percocet [Oxycodone-Acetaminophen]     Severe hiccups    Current Outpatient Prescriptions on File Prior to Visit  Medication Sig Dispense Refill  . acetaminophen (TYLENOL) 500 MG tablet Take 1,000 mg by mouth every 6 (six) hours as needed for moderate pain.      [name] amLODipine-olmesartan (AZOR) 5-20 MG per tablet Take 0.5 tablets by mouth every evening.  45 tablet  1  . aspirin EC 81 MG tablet Take 81 mg by mouth daily.      [name] desloratadine (CLARINEX) 5 MG tablet Take 1 tablet (5 mg total) by mouth daily.  90 tablet  1  . fluticasone (FLONASE) 50 MCG/ACT nasal spray Place 2 sprays into both nostrils daily.  16 g  3  . omeprazole (PRILOSEC) 20 MG capsule Take 20 mg by mouth daily as needed.       . sodium chloride (OCEAN) 0.65 % nasal spray Place 1 spray into the nose daily as needed for congestion.       No current facility-administered medications on file prior to visit.    BP 120/62  Temp(Src) 98.7 F (37.1 C) (Oral)  Wt 202 lb (91.627 kg)    Objective:   Physical Exam  Constitutional: He is oriented to person, place, and time. He appears well-developed and well-nourished. No distress.  HENT:  Head: Normocephalic and atraumatic.  Eyes: EOM are normal. Pupils are equal, round, and reactive to light.  Neck: Neck supple.  Cardiovascular: Normal rate, regular rhythm and normal heart sounds.   No murmur heard. Pulmonary/Chest: Effort normal and breath sounds normal. He has no wheezes.  Musculoskeletal: He exhibits no edema.  Lymphadenopathy:    He has no cervical adenopathy.  Neurological: He is alert and oriented to person,  place, and time. No cranial nerve deficit.  Skin: Skin is warm and dry.  Psychiatric: He has a normal mood and affect. His behavior is normal.          Assessment & Plan:

## 2013-07-05 NOTE — Assessment & Plan Note (Signed)
Stable.  No change in medication regimen. BP: 120/62 mmHg

## 2013-07-05 NOTE — Progress Notes (Signed)
Pre visit review using our clinic review tool, if applicable. No additional management support is needed unless otherwise documented below in the visit note. 

## 2013-07-05 NOTE — Patient Instructions (Signed)
Please complete the following lab tests before your next follow up appointment: FLP, LFTs - 272.4 

## 2013-07-05 NOTE — Assessment & Plan Note (Signed)
Patient's fatigue symptoms improved with discontinuing simvastatin. However it is unclear whether his improvement secondary to discontinuing cholesterol medication versus patient recovering from his previous surgery. Restart statin therapy-atorvastatin 10 mg once daily.  Lab Results  Component Value Date   CKTOTAL 86 06/07/2013

## 2013-07-05 NOTE — Assessment & Plan Note (Signed)
Patient previously seen for cough x 1 month. Chest x-ray raised concern for possible right upper lobe nodule. CT scan of chest showed normal right upper lobe but did show 5 mm nodule in the right lower lobe. Patient is low risk for pulmonary malignancy. Plan to repeat CT scan in 12 months.

## 2013-09-09 ENCOUNTER — Other Ambulatory Visit: Payer: Self-pay | Admitting: Dermatology

## 2013-09-18 ENCOUNTER — Other Ambulatory Visit: Payer: Self-pay | Admitting: Internal Medicine

## 2013-09-18 DIAGNOSIS — R911 Solitary pulmonary nodule: Secondary | ICD-10-CM

## 2013-10-29 ENCOUNTER — Other Ambulatory Visit: Payer: Self-pay | Admitting: Internal Medicine

## 2013-11-08 ENCOUNTER — Ambulatory Visit: Payer: Medicare Other | Admitting: Internal Medicine

## 2013-11-15 ENCOUNTER — Ambulatory Visit (INDEPENDENT_AMBULATORY_CARE_PROVIDER_SITE_OTHER): Payer: Medicare Other | Admitting: Internal Medicine

## 2013-11-15 VITALS — BP 130/70 | Temp 98.6°F | Wt 207.0 lb

## 2013-11-15 DIAGNOSIS — Z23 Encounter for immunization: Secondary | ICD-10-CM

## 2013-11-15 DIAGNOSIS — M25552 Pain in left hip: Secondary | ICD-10-CM

## 2013-11-15 DIAGNOSIS — M25559 Pain in unspecified hip: Secondary | ICD-10-CM

## 2013-11-15 NOTE — Assessment & Plan Note (Addendum)
Patient having recurrent left hip pain. He has no discomfort with internal or external rotation of his left hip. He has point tenderness near his trochanter bursa. I discussed risks andbenefits of bursa injection. Patient agreed to proceed. Area prepped with Betadine and alcohol. 1 cc of 1-1 Depo-Medrol 80 mg and 1% lidocaine injected into left  trochanteric bursa. No complications. Patient advised to apply ice for the next 3-5 days.

## 2013-11-15 NOTE — Progress Notes (Signed)
Pre visit review using our clinic review tool, if applicable. No additional management support is needed unless otherwise documented below in the visit note. 

## 2013-11-15 NOTE — Progress Notes (Signed)
Subjective:    Patient ID: David [name], male    DOB: 11-Dec-1940, 73 y.o.   MRN: [id number]  Hip Pain   73 year old white male with history of prostate cancer, hypertension and hyperlipidemia complains of chronic left hip pain. He had similar complaints in the past and was referred to orthopedic specialist. Patient reported to have mild degenerative changes. Patient reports his left hip pain is worse with prolonged standing, sitting or laying on his left side. She was tried meloxicam for 1 to 2 months a year ago without significant improvement.    Review of Systems Negative for fever or chills    Past Medical History  Diagnosis Date  . Hyperlipidemia   . Hypertension   . LBP (low back pain) 06/2005    L5 radicular symptoms; MRI of LS spine severe spondylosis at L4-L5 with central cancal stenosis   . History of kidney stones     2007  . Spermatocele   . BPH (benign prostatic hypertrophy)   . Organic impotence   . Nocturia   . Arthritis     gen.  and left hip  . Wears glasses   . Prostate cancer 10/2011    (Low grade) [facility] - Dr. Junious Silk  . GERD (gastroesophageal reflux disease)     occasional  . Kidney tumor   . Pyelonephritis 03/05/2013  . Renal oncocytoma of right kidney 01/23/2013    As 3.2 x 3.5 cm enhancing exophytic mass projecting off the lower pole. This is consistent with a renal cell carcinoma. No retroperitoneal lymphadenopathy.   . 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 Marital Status: Married    Spouse Name: N/A    Number of Children: N/A  . Years of Education: N/A   Occupational History  . Not on file.   Social History Main Topics  . Smoking status: Former Smoker -- 0.25 packs/day for 10 years    Types: Cigarettes    Quit date: 02/28/1981  . Smokeless tobacco: Never Used  . Alcohol Use: 0.0 oz/week     Comment: occasional  . Drug Use: No  . Sexual Activity: Not on file   Other Topics Concern  . Not on file   Social  History Narrative   Married- 48 yrs   2 daughters   5 grandchildren   Never Smoked   Alcohol use-yes (occasional/social)   Retired- Engineer, water    Past Surgical History  Procedure Laterality Date  . Colonoscopy  02/15/2006, 2014    Internal hemorrhoids (Dr [name]), last in 2014  . Repair umbilical and ventral hernia's w/ mesh  08-21-2009  . Transurethral resection of prostate  AGE 26  . Tonsillectomy and adenoidectomy  as child  . Inguinal hernia repair Right 1980  . Spermatocelectomy Left 12/18/2012    Procedure: LEFT SPERMATOCELECTOMY;  Surgeon: [name], MD;  Location: [facility];  Service: Urology;  Laterality: Left;  . Prostate biopsy N/A 12/18/2012    Procedure: BIOPSY TRANSRECTAL ULTRASONIC PROSTATE (TUBP);  Surgeon: [name], MD;  Location[facility];  Service: Urology;  Laterality: N/A;  . Prostate biopsy  12/05/11    gleason 6, 3/12 cores  . Robotic assited partial nephrectomy Right 02/15/2013    Procedure: ROBOTIC ASSITED PARTIAL NEPHRECTOMY;  Surgeon: [name], MD;  Location: [facility];  Service: Urology;  Laterality: Right;  . Robot assisted laparoscopic radical prostatectomy N/A 02/15/2013    Procedure: ROBOTIC  ASSISTED LAPAROSCOPIC RADICAL PROSTATECTOMY;  Surgeon: [name], MD;  Location: [facility];  Service: Urology;  Laterality: N/A;  . Lymphadenectomy Bilateral 02/15/2013    Procedure: LYMPHADENECTOMY WITH INDOCYANINE GREEN DYE INJECTION;  Surgeon: [name], MD;  Location: [facility];  Service: Urology;  Laterality: Bilateral;    Family History  Problem Relation Age of Onset  . Cancer Mother     colon  . Colon cancer Mother 90  . Pulmonary fibrosis Father     father owned a Radio broadcast assistant (father blamed inhalation of chemicals and fumes)  . Coronary artery disease Other   . Hyperlipidemia Other     Allergies  Allergen Reactions  . Ace Inhibitors Other (See Comments)     REACTION: Cough  . Codeine     Severe hiccups  . Percocet [Oxycodone-Acetaminophen]     Severe hiccups    Current Outpatient Prescriptions on File Prior to Visit  Medication Sig Dispense Refill  . acetaminophen (TYLENOL) 500 MG tablet Take 1,000 mg by mouth every 6 (six) hours as needed for moderate pain.      [name] aspirin EC 81 MG tablet Take 81 mg by mouth daily.      [name] atorvastatin (LIPITOR) 10 MG tablet Take 1 tablet (10 mg total) by mouth daily.  90 tablet  3  . AZOR 5-20 MG per tablet take 1/2 tablet by mouth at bedtime  45 tablet  1  . desloratadine (CLARINEX) 5 MG tablet Take 1 tablet (5 mg total) by mouth daily.  90 tablet  1  . fluticasone (FLONASE) 50 MCG/ACT nasal spray Place 2 sprays into both nostrils daily.  16 g  3  . omeprazole (PRILOSEC) 20 MG capsule Take 20 mg by mouth daily as needed.       . sodium chloride (OCEAN) 0.65 % nasal spray Place 1 spray into the nose daily as needed for congestion.       No current facility-administered medications on file prior to visit.    BP 130/70  Temp(Src) 98.6 F (37 C) (Oral)  Wt 207 lb (93.895 kg)    Objective:   Physical Exam  Constitutional: He is oriented to person, place, and time. He appears well-developed and well-nourished. No distress.  Cardiovascular: Normal rate, regular rhythm and normal heart sounds.   No murmur heard. Pulmonary/Chest: Effort normal and breath sounds normal. He has no wheezes.  Musculoskeletal:  Slightly favors left side with walking.  No pain with internal or external rotation of left hip. Tenderness of left trochanteric bursa  Neurological: He is alert and oriented to person, place, and time. No cranial nerve deficit.  Psychiatric: He has a normal mood and affect. His behavior is normal.          Assessment & Plan:

## 2013-12-23 ENCOUNTER — Encounter: Payer: Self-pay | Admitting: Internal Medicine

## 2013-12-23 ENCOUNTER — Ambulatory Visit (INDEPENDENT_AMBULATORY_CARE_PROVIDER_SITE_OTHER): Payer: Medicare Other | Admitting: Internal Medicine

## 2013-12-23 VITALS — BP 134/80 | HR 60 | Temp 98.9°F | Ht 72.0 in | Wt 206.0 lb

## 2013-12-23 DIAGNOSIS — M25552 Pain in left hip: Secondary | ICD-10-CM

## 2013-12-23 DIAGNOSIS — I10 Essential (primary) hypertension: Secondary | ICD-10-CM

## 2013-12-23 DIAGNOSIS — G8929 Other chronic pain: Secondary | ICD-10-CM

## 2013-12-23 NOTE — Progress Notes (Signed)
Pre visit review using our clinic review tool, if applicable. No additional management support is needed unless otherwise documented below in the visit note. 

## 2013-12-23 NOTE — Progress Notes (Signed)
Subjective:    Patient ID: David [name], male    DOB: 1940/12/03, 73 y.o.   MRN: [id number]  HPI  73 year old white male for follow-up regarding left hip pain.  Patient was last seen on 11/15/2013. Patient has history of mild degenerative changes of left hip. Patient complained of worsening pain with laying on his left side. There was concern for possible hip or situs. Patient's left trochanteric bursa was injected with Depo-Medrol.  Unfortunately there has not been any improvement in left hip symptoms. He continues to use over-the-counter NSAIDs as needed.  Location of pain is still above left trochanter.  His symptoms worse with prolonged sitting and physical activity.  Review of Systems Negative for fever or chills.  He also has history of chronic low back pain     Past Medical History  Diagnosis Date  . Hyperlipidemia   . Hypertension   . LBP (low back pain) 06/2005    L5 radicular symptoms; MRI of LS spine severe spondylosis at L4-L5 with central cancal stenosis   . History of kidney stones     2007  . Spermatocele   . BPH (benign prostatic hypertrophy)   . Organic impotence   . Nocturia   . Arthritis     gen.  and left hip  . Wears glasses   . Prostate cancer 10/2011    (Low grade) [facility] - Dr. Junious Silk  . GERD (gastroesophageal reflux disease)     occasional  . Kidney tumor   . Pyelonephritis 03/05/2013  . Renal oncocytoma of right kidney 01/23/2013    As 3.2 x 3.5 cm enhancing exophytic mass projecting off the lower pole. This is consistent with a renal cell carcinoma. No retroperitoneal lymphadenopathy.   . 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 Marital Status: Married    Spouse Name: N/A    Number of Children: N/A  . Years of Education: N/A   Occupational History  . Not on file.   Social History Main Topics  . Smoking status: Former Smoker -- 0.25 packs/day for 10 years    Types: Cigarettes    Quit date: 02/28/1981  . Smokeless  tobacco: Never Used  . Alcohol Use: 0.0 oz/week     Comment: occasional  . Drug Use: No  . Sexual Activity: Not on file   Other Topics Concern  . Not on file   Social History Narrative   Married- 50 yrs   2 daughters   5 grandchildren   Never Smoked   Alcohol use-yes (occasional/social)   Retired- Engineer, water    Past Surgical History  Procedure Laterality Date  . Colonoscopy  02/15/2006, 2014    Internal hemorrhoids (Dr [name]), last in 2014  . Repair umbilical and ventral hernia's w/ mesh  08-21-2009  . Transurethral resection of prostate  AGE 49  . Tonsillectomy and adenoidectomy  as child  . Inguinal hernia repair Right 1980  . Spermatocelectomy Left 12/18/2012    Procedure: LEFT SPERMATOCELECTOMY;  Surgeon: [name], MD;  Location: [facility];  Service: Urology;  Laterality: Left;  . Prostate biopsy N/A 12/18/2012    Procedure: BIOPSY TRANSRECTAL ULTRASONIC PROSTATE (TUBP);  Surgeon: [name], MD;  Location: [facility];  Service: Urology;  Laterality: N/A;  . Prostate biopsy  12/05/11    gleason 6, 3/12 cores  . Robotic assited partial nephrectomy Right 02/15/2013    Procedure: ROBOTIC ASSITED PARTIAL NEPHRECTOMY;  Surgeon: [name], MD;  Location: [facility];  Service: Urology;  Laterality: Right;  . Robot assisted laparoscopic radical prostatectomy N/A 02/15/2013    Procedure: ROBOTIC ASSISTED LAPAROSCOPIC RADICAL PROSTATECTOMY;  Surgeon: [name], MD;  Location: [facility];  Service: Urology;  Laterality: N/A;  . Lymphadenectomy Bilateral 02/15/2013    Procedure: LYMPHADENECTOMY WITH INDOCYANINE GREEN DYE INJECTION;  Surgeon: [name], MD;  Location: [facility];  Service: Urology;  Laterality: Bilateral;    Family History  Problem Relation Age of Onset  . Cancer Mother     colon  . Colon cancer Mother 90  . Pulmonary fibrosis Father     father owned a Radio broadcast assistant (father blamed  inhalation of chemicals and fumes)  . Coronary artery disease Other   . Hyperlipidemia Other     Allergies  Allergen Reactions  . Ace Inhibitors Other (See Comments)    REACTION: Cough  . Codeine     Severe hiccups  . Percocet [Oxycodone-Acetaminophen]     Severe hiccups    Current Outpatient Prescriptions on File Prior to Visit  Medication Sig Dispense Refill  . acetaminophen (TYLENOL) 500 MG tablet Take 1,000 mg by mouth every 6 (six) hours as needed for moderate pain.      [name] aspirin EC 81 MG tablet Take 81 mg by mouth daily.      [name] atorvastatin (LIPITOR) 10 MG tablet Take 1 tablet (10 mg total) by mouth daily.  90 tablet  3  . AZOR 5-20 MG per tablet take 1/2 tablet by mouth at bedtime  45 tablet  1  . desloratadine (CLARINEX) 5 MG tablet Take 1 tablet (5 mg total) by mouth daily.  90 tablet  1  . fluticasone (FLONASE) 50 MCG/ACT nasal spray Place 2 sprays into both nostrils daily.  16 g  3  . omeprazole (PRILOSEC) 20 MG capsule Take 20 mg by mouth daily as needed.       . sodium chloride (OCEAN) 0.65 % nasal spray Place 1 spray into the nose daily as needed for congestion.       No current facility-administered medications on file prior to visit.    BP 134/80  Pulse 60  Temp(Src) 98.9 F (37.2 C) (Oral)  Ht 6' (1.829 m)  Wt 206 lb (93.441 kg)  BMI 27.93 kg/m2    Objective:   Physical Exam  Constitutional: He is oriented to person, place, and time. He appears well-developed.  Cardiovascular: Normal rate, regular rhythm and normal heart sounds.   Pulmonary/Chest: Effort normal and breath sounds normal.  Musculoskeletal: He exhibits no edema.  Mild discomfort with internal and external rotation.  Tenderness to palpation above left trochanter Pain with stretching IT band  Neurological: He is alert and oriented to person, place, and time. No cranial nerve deficit.  Patellar reflexes +2 bilaterally, +1 Achilles  Psychiatric: He has a normal mood and affect. His behavior  is normal.          Assessment & Plan:

## 2013-12-23 NOTE — Assessment & Plan Note (Signed)
No improvement with previous injection of left trochanteric bursa.  I doubt patient's symptoms mainly from degenerative joint disease. Repeat x-ray of left hip. Refer to orthopedic specialist for further evaluation. Consider MRI of lumbar spine and left hip.  Continue to use over-the-counter naproxen as needed. Monitor electrolytes and kidney function.

## 2013-12-24 ENCOUNTER — Ambulatory Visit (INDEPENDENT_AMBULATORY_CARE_PROVIDER_SITE_OTHER)
Admission: RE | Admit: 2013-12-24 | Discharge: 2013-12-24 | Disposition: A | Discharge status: Home / Self Care | Payer: Medicare Other | Source: Ambulatory Visit | Attending: Internal Medicine | Admitting: Internal Medicine

## 2013-12-24 ENCOUNTER — Other Ambulatory Visit: Payer: Self-pay | Admitting: Internal Medicine

## 2013-12-24 DIAGNOSIS — M5416 Radiculopathy, lumbar region: Secondary | ICD-10-CM

## 2013-12-24 DIAGNOSIS — M25552 Pain in left hip: Secondary | ICD-10-CM

## 2013-12-24 DIAGNOSIS — G8929 Other chronic pain: Secondary | ICD-10-CM

## 2013-12-24 LAB — BASIC METABOLIC PANEL
BUN: 26 mg/dL — ABNORMAL HIGH (ref 6–23)
CHLORIDE: 109 meq/L (ref 96–112)
CO2: 20 meq/L (ref 19–32)
CREATININE: 1.2 mg/dL (ref 0.4–1.5)
Calcium: 10.6 mg/dL — ABNORMAL HIGH (ref 8.4–10.5)
GFR: 63.74 mL/min (ref >60.00)
GLUCOSE: 76 mg/dL (ref 70–99)
Potassium: 4.4 mEq/L (ref 3.5–5.1)
Sodium: 140 mEq/L (ref 135–145)

## 2013-12-25 ENCOUNTER — Telehealth: Payer: Self-pay

## 2013-12-25 NOTE — Telephone Encounter (Signed)
It may take a while for pt to see Dr [name].  If he is able to be seen earlier, we can defer MRIs.

## 2013-12-25 NOTE — Telephone Encounter (Signed)
  Spoke to pt today... Pt had the below question:  MRI before seeing [place]  Please contact the pt with what he needs to do.

## 2013-12-26 NOTE — Telephone Encounter (Signed)
Pt has an appt with [facility] on 01/06/14.  He decided to call [facility] back and schedule MRI.  Nothing further is needed

## 2013-12-30 ENCOUNTER — Ambulatory Visit
Admission: RE | Admit: 2013-12-30 | Discharge: 2013-12-30 | Disposition: A | Discharge status: Home / Self Care | Payer: Medicare Other | Source: Ambulatory Visit | Attending: Internal Medicine | Admitting: Internal Medicine

## 2013-12-30 DIAGNOSIS — M5416 Radiculopathy, lumbar region: Secondary | ICD-10-CM

## 2013-12-30 DIAGNOSIS — M25552 Pain in left hip: Secondary | ICD-10-CM

## 2014-02-10 ENCOUNTER — Ambulatory Visit (INDEPENDENT_AMBULATORY_CARE_PROVIDER_SITE_OTHER): Payer: Medicare Other | Admitting: Internal Medicine

## 2014-02-10 ENCOUNTER — Encounter: Payer: Self-pay | Admitting: Internal Medicine

## 2014-02-10 VITALS — BP 168/80 | HR 71 | Temp 98.4°F | Ht 72.0 in | Wt 205.0 lb

## 2014-02-10 DIAGNOSIS — M25552 Pain in left hip: Secondary | ICD-10-CM

## 2014-02-10 DIAGNOSIS — I10 Essential (primary) hypertension: Secondary | ICD-10-CM

## 2014-02-10 DIAGNOSIS — C61 Malignant neoplasm of prostate: Secondary | ICD-10-CM

## 2014-02-10 MED ORDER — VALSARTAN 80 MG PO TABS: 80.0000 mg | ORAL_TABLET | Freq: Every day | ORAL | Status: DC

## 2014-02-10 MED ORDER — AMLODIPINE BESYLATE 2.5 MG PO TABS: 2.5000 mg | ORAL_TABLET | Freq: Every day | ORAL | Status: DC

## 2014-02-10 NOTE — Progress Notes (Signed)
Subjective:    Patient ID: David [name], male    DOB: 02/03/1941, 73 y.o.   MRN: [id number]  HPI  73 year old white male previously seen for left hip pain for follow-up. MRI of left hip was positive for tear of gluteus minimus muscle. Patient was seen by orthopedic specialist. Patient reports significant improvement in pain with undergoing physical therapy. (Muscle ultrasound and TENS unit)  Hypertension - his blood pressure sporadically elevated today. Patient reports Azor with no longer be on his formulary starting January 2016.  Review of Systems Negative for chest pain or shortness of breath    Past Medical History  Diagnosis Date  . Hyperlipidemia   . Hypertension   . LBP (low back pain) 06/2005    L5 radicular symptoms; MRI of LS spine severe spondylosis at L4-L5 with central cancal stenosis   . History of kidney stones     2007  . Spermatocele   . BPH (benign prostatic hypertrophy)   . Organic impotence   . Nocturia   . Arthritis     gen.  and left hip  . Wears glasses   . Prostate cancer 10/2011    (Low grade) [facility] - Dr. Junious Silk  . GERD (gastroesophageal reflux disease)     occasional  . Kidney tumor   . Pyelonephritis 03/05/2013  . Renal oncocytoma of right kidney 01/23/2013    As 3.2 x 3.5 cm enhancing exophytic mass projecting off the lower pole. This is consistent with a renal cell carcinoma. No retroperitoneal lymphadenopathy.   . 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 Marital Status: Married    Spouse Name: N/A    Number of Children: N/A  . Years of Education: N/A   Occupational History  . Not on file.   Social History Main Topics  . Smoking status: Former Smoker -- 0.25 packs/day for 10 years    Types: Cigarettes    Quit date: 02/28/1981  . Smokeless tobacco: Never Used  . Alcohol Use: 0.0 oz/week     Comment: occasional  . Drug Use: No  . Sexual Activity: Not on file   Other Topics Concern  . Not on file   Social  History Narrative   Married- 3 yrs   2 daughters   5 grandchildren   Never Smoked   Alcohol use-yes (occasional/social)   Retired- Engineer, water    Past Surgical History  Procedure Laterality Date  . Colonoscopy  02/15/2006, 2014    Internal hemorrhoids (Dr [name]), last in 2014  . Repair umbilical and ventral hernia's w/ mesh  08-21-2009  . Transurethral resection of prostate  AGE 32  . Tonsillectomy and adenoidectomy  as child  . Inguinal hernia repair Right 1980  . Spermatocelectomy Left 12/18/2012    Procedure: LEFT SPERMATOCELECTOMY;  Surgeon: [name], MD;  Location: [facility];  Service: Urology;  Laterality: Left;  . Prostate biopsy N/A 12/18/2012    Procedure: BIOPSY TRANSRECTAL ULTRASONIC PROSTATE (TUBP);  Surgeon: [name], MD;  Location: [facility];  Service: Urology;  Laterality: N/A;  . Prostate biopsy  12/05/11    gleason 6, 3/12 cores  . Robotic assited partial nephrectomy Right 02/15/2013    Procedure: ROBOTIC ASSITED PARTIAL NEPHRECTOMY;  Surgeon: [name], MD;  Location: [facility];  Service: Urology;  Laterality: Right;  . Robot assisted laparoscopic radical prostatectomy N/A 02/15/2013    Procedure: ROBOTIC ASSISTED LAPAROSCOPIC RADICAL PROSTATECTOMY;  Surgeon: [name], MD;  Location: [facility];  Service: Urology;  Laterality: N/A;  . Lymphadenectomy Bilateral 02/15/2013    Procedure: LYMPHADENECTOMY WITH INDOCYANINE GREEN DYE INJECTION;  Surgeon: [name], MD;  Location: [facility];  Service: Urology;  Laterality: Bilateral;    Family History  Problem Relation Age of Onset  . Cancer Mother     colon  . Colon cancer Mother 85  . Pulmonary fibrosis Father     father owned a Radio broadcast assistant (father blamed inhalation of chemicals and fumes)  . Coronary artery disease Other   . Hyperlipidemia Other     Allergies  Allergen Reactions  . Ace Inhibitors Other (See Comments)     REACTION: Cough  . Codeine     Severe hiccups  . Percocet [Oxycodone-Acetaminophen]     Severe hiccups    Current Outpatient Prescriptions on File Prior to Visit  Medication Sig Dispense Refill  . acetaminophen (TYLENOL) 500 MG tablet Take 1,000 mg by mouth every 6 (six) hours as needed for moderate pain.    [name] aspirin EC 81 MG tablet Take 81 mg by mouth daily.    [name] atorvastatin (LIPITOR) 10 MG tablet Take 1 tablet (10 mg total) by mouth daily. 90 tablet 3  . AZOR 5-20 MG per tablet take 1/2 tablet by mouth at bedtime 45 tablet 1  . desloratadine (CLARINEX) 5 MG tablet Take 1 tablet (5 mg total) by mouth daily. 90 tablet 1  . fluticasone (FLONASE) 50 MCG/ACT nasal spray Place 2 sprays into both nostrils daily. 16 g 3  . omeprazole (PRILOSEC) 20 MG capsule Take 20 mg by mouth daily as needed.     . sodium chloride (OCEAN) 0.65 % nasal spray Place 1 spray into the nose daily as needed for congestion.     No current facility-administered medications on file prior to visit.    BP 168/80 mmHg  Pulse 71  Temp(Src) 98.4 F (36.9 C) (Oral)  Ht 6' (1.829 m)  Wt 205 lb (92.987 kg)  BMI 27.80 kg/m2    Objective:   Physical Exam  Constitutional: He is oriented to person, place, and time. He appears well-developed and well-nourished. No distress.  Cardiovascular: Normal rate, regular rhythm and normal heart sounds.   Pulmonary/Chest: Effort normal and breath sounds normal. He has no wheezes.  Neurological: He is alert and oriented to person, place, and time. No cranial nerve deficit.  Psychiatric: He has a normal mood and affect. His behavior is normal.          Assessment & Plan:

## 2014-02-10 NOTE — Assessment & Plan Note (Signed)
Blood pressure sporadically elevated today. Patient advised to monitor home blood pressure readings. His current medication will no longer be on formulary as of January 2015. Switch to valsartan 80 mg and amlodipine 2.5 mg.  BP: (!) 168/80 mmHg

## 2014-02-10 NOTE — Patient Instructions (Addendum)
Monitor your blood pressure at home Switch to your new blood pressure medication as directed. Please complete the following lab tests before your next follow up appointment: BMET - 401.9 FLP, LFTs - 272.4 Schedule next office visit as Medicare Well Exam

## 2014-02-10 NOTE — Assessment & Plan Note (Signed)
Patient followed by urology. Surveillance PSA undetectable.

## 2014-02-10 NOTE — Assessment & Plan Note (Signed)
MRI of left hip showed torn gluteus minimus muscle. His symptoms improved with physical therapy.

## 2014-02-10 NOTE — Progress Notes (Signed)
Pre visit review using our clinic review tool, if applicable. No additional management support is needed unless otherwise documented below in the visit note. 

## 2014-02-17 ENCOUNTER — Encounter: Payer: Self-pay | Admitting: Internal Medicine

## 2014-04-07 ENCOUNTER — Other Ambulatory Visit: Payer: Self-pay | Admitting: Internal Medicine

## 2014-04-08 DIAGNOSIS — H25819 Combined forms of age-related cataract, unspecified eye: Secondary | ICD-10-CM | POA: Diagnosis not present

## 2014-04-24 ENCOUNTER — Other Ambulatory Visit: Payer: Self-pay | Admitting: Internal Medicine

## 2014-05-21 ENCOUNTER — Encounter: Payer: Self-pay | Admitting: Internal Medicine

## 2014-05-21 ENCOUNTER — Ambulatory Visit (INDEPENDENT_AMBULATORY_CARE_PROVIDER_SITE_OTHER): Payer: Medicare Other | Admitting: Internal Medicine

## 2014-05-21 VITALS — BP 134/72 | HR 75 | Temp 98.5°F | Ht 72.0 in | Wt 212.0 lb

## 2014-05-21 DIAGNOSIS — R911 Solitary pulmonary nodule: Secondary | ICD-10-CM

## 2014-05-21 DIAGNOSIS — Z0000 Encounter for general adult medical examination without abnormal findings: Secondary | ICD-10-CM | POA: Insufficient documentation

## 2014-05-21 MED ORDER — FLUTICASONE PROPIONATE 50 MCG/ACT NA SUSP: 2.0000 | Freq: Every day | NASAL | Status: DC

## 2014-05-21 NOTE — Progress Notes (Signed)
Pre visit review using our clinic review tool, if applicable. No additional management support is needed unless otherwise documented below in the visit note. 

## 2014-05-21 NOTE — Assessment & Plan Note (Signed)
Reviewed adult health maintenance protocols.  Medicare wellness questionnaire reviewed in detail. Screening for substance abuse, smoking and alcohol use negative. He exercises on a regular basis. He does not have any issues with activities of daily living. His gait is stable. There is no evidence of hearing loss. Screening for depression and memory loss negative. He is up-to-date with all adult vaccines. Patient reports she was vaccinated with shingles vaccine when he was a patient of Dr. Juventino Slovak.

## 2014-05-21 NOTE — Progress Notes (Signed)
Subjective:    Patient ID: David [name], male    DOB: [birth year], 74 y.o.   MRN: [id number]  HPI  74 year old white male with history of prostate cancer status post radical prostatectomy, hypertension and hyperlipidemia for Medicare wellness exam.  Patient has not had any hospitalizations or visits to the emergency room within the past year.  His last eye exam was in February 2016 by Dr. Marica Otter. He does not have glaucoma. He wears corrective lenses.  In April 2015 patient had CT of abdomen and pelvis which incidentally found 5 mm noncalcified nodule of right lower lobe. He has remote history of tobacco use. He denies any chronic cough or shortness of breath.   Review of Systems  Constitutional: Negative for activity change, appetite change and unexpected weight change.  Eyes: Negative for visual disturbance.  Respiratory: Negative for cough, chest tightness and shortness of breath.   Cardiovascular: Negative for chest pain.  Genitourinary: Negative for difficulty urinating. uses pads for mild incontinence with physical activity Neurological: Negative for headaches.  Gastrointestinal: Negative for abdominal pain, heartburn melena or hematochezia Psych: Negative for depression or anxiety Endo:  No polyuria or polydypsia        Past Medical History  Diagnosis Date  . Hyperlipidemia   . Hypertension   . LBP (low back pain) 06/2005    L5 radicular symptoms; MRI of LS spine severe spondylosis at L4-L5 with central cancal stenosis   . History of kidney stones     2007  . Spermatocele   . BPH (benign prostatic hypertrophy)   . Organic impotence   . Nocturia   . Arthritis     gen.  and left hip  . Wears glasses   . Prostate cancer 10/2011    (Low grade) [facility] - Dr. Junious Silk  . GERD (gastroesophageal reflux disease)     occasional  . Kidney tumor   . Pyelonephritis 03/05/2013  . Renal oncocytoma of right kidney 01/23/2013    As 3.2 x 3.5 cm enhancing  exophytic mass projecting off the lower pole. This is consistent with a renal cell carcinoma. No retroperitoneal lymphadenopathy.   . 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 Marital Status: Married    Spouse Name: N/A  . Number of Children: N/A  . Years of Education: N/A   Occupational History  . Not on file.   Social History Main Topics  . Smoking status: Former Smoker -- 0.25 packs/day for 10 years    Types: Cigarettes    Quit date: 02/28/1981  . Smokeless tobacco: Never Used  . Alcohol Use: 0.0 oz/week     Comment: occasional  . Drug Use: No  . Sexual Activity: Not on file   Other Topics Concern  . Not on file   Social History Narrative   Married- 18 yrs   2 daughters   5 grandchildren   Never Smoked   Alcohol use-yes (occasional/social)   Retired- Engineer, water      Physician roster:      Urologist - Dr. [name]   Orthopedics - Dr. [name]   Dermatology - Dr. [name]    Past Surgical History  Procedure Laterality Date  . Colonoscopy  02/15/2006, 2014    Internal hemorrhoids (Dr [name]), last in 2014  . Repair umbilical and ventral hernia's w/ mesh  08-21-2009  . Transurethral resection of prostate  AGE 59  . Tonsillectomy and adenoidectomy  as child  .  Inguinal hernia repair Right 1980  . Spermatocelectomy Left 12/18/2012    Procedure: LEFT SPERMATOCELECTOMY;  Surgeon: [name], MD;  Location: [facility];  Service: Urology;  Laterality: Left;  . Prostate biopsy N/A 12/18/2012    Procedure: BIOPSY TRANSRECTAL ULTRASONIC PROSTATE (TUBP);  Surgeon: [name], MD;  Location: [facility];  Service: Urology;  Laterality: N/A;  . Prostate biopsy  12/05/11    gleason 6, 3/12 cores  . Robotic assited partial nephrectomy Right 02/15/2013    Procedure: ROBOTIC ASSITED PARTIAL NEPHRECTOMY;  Surgeon: [name], MD;  Location: [facility];  Service: Urology;  Laterality: Right;  . Robot assisted  laparoscopic radical prostatectomy N/A 02/15/2013    Procedure: ROBOTIC ASSISTED LAPAROSCOPIC RADICAL PROSTATECTOMY;  Surgeon: [name], MD;  Location: [facility];  Service: Urology;  Laterality: N/A;  . Lymphadenectomy Bilateral 02/15/2013    Procedure: LYMPHADENECTOMY WITH INDOCYANINE GREEN DYE INJECTION;  Surgeon: [name], MD;  Location: [facility];  Service: Urology;  Laterality: Bilateral;    Family History  Problem Relation Age of Onset  . Cancer Mother     colon  . Colon cancer Mother 49  . Pulmonary fibrosis Father     father owned a Radio broadcast assistant (father blamed inhalation of chemicals and fumes)  . Coronary artery disease Other   . Hyperlipidemia Other     Allergies  Allergen Reactions  . Ace Inhibitors Other (See Comments)    REACTION: Cough  . Codeine     Severe hiccups  . Percocet [Oxycodone-Acetaminophen]     Severe hiccups    Current Outpatient Prescriptions on File Prior to Visit  Medication Sig Dispense Refill  . acetaminophen (TYLENOL) 500 MG tablet Take 1,000 mg by mouth every 6 (six) hours as needed for moderate pain.    [name] aspirin EC 81 MG tablet Take 81 mg by mouth daily.    [name] atorvastatin (LIPITOR) 10 MG tablet Take 1 tablet (10 mg total) by mouth daily. 90 tablet 3  . AZOR 5-20 MG per tablet take 1/2 tablet by mouth at bedtime 45 tablet 1  . desloratadine (CLARINEX) 5 MG tablet Take 1 tablet (5 mg total) by mouth daily. 90 tablet 1  . omeprazole (PRILOSEC) 20 MG capsule take 1 capsule by mouth once daily 90 capsule 1  . sodium chloride (OCEAN) 0.65 % nasal spray Place 1 spray into the nose daily as needed for congestion.     No current facility-administered medications on file prior to visit.    BP 134/72 mmHg  Pulse 75  Temp(Src) 98.5 F (36.9 C) (Oral)  Ht 6' (1.829 m)  Wt 212 lb (96.163 kg)  BMI 28.75 kg/m2      Objective:   Physical Exam   Constitutional: Appears well-developed and well-nourished. No distress.  Head:  Normocephalic and atraumatic.  Ear:  Right and left ear normal.  TMs clear.  Hearing is grossly normal Mouth/Throat: Oropharynx is clear and moist.  Eyes: Conjunctivae are normal. Pupils are equal, round, and reactive to light.  Neck: Normal range of motion. Neck supple. No thyromegaly present. No carotid bruit Cardiovascular: Normal rate, regular rhythm and normal heart sounds.  Exam reveals no gallop and no friction rub.  No murmur heard. Pulmonary/Chest: Effort normal and breath sounds normal.  No wheezes. No rales.  Abdominal: Soft. Bowel sounds are normal. No mass. There is no tenderness.  Neurological: Alert. No cranial nerve deficit.  Skin: Skin is warm and dry.  Psychiatric: Normal  mood and affect. Behavior is normal.         Assessment & Plan:

## 2014-05-21 NOTE — Assessment & Plan Note (Signed)
Repeat CT of chest is planned for April 2016 for follow-up of 5 mm nodule in right lower lobe.

## 2014-06-10 ENCOUNTER — Ambulatory Visit (INDEPENDENT_AMBULATORY_CARE_PROVIDER_SITE_OTHER)
Admission: RE | Admit: 2014-06-10 | Discharge: 2014-06-10 | Disposition: A | Discharge status: Home / Self Care | Payer: Medicare Other | Source: Ambulatory Visit | Attending: Internal Medicine | Admitting: Internal Medicine

## 2014-06-10 DIAGNOSIS — R911 Solitary pulmonary nodule: Secondary | ICD-10-CM | POA: Diagnosis not present

## 2014-06-10 DIAGNOSIS — J9811 Atelectasis: Secondary | ICD-10-CM | POA: Diagnosis not present

## 2014-06-18 DIAGNOSIS — H6121 Impacted cerumen, right ear: Secondary | ICD-10-CM | POA: Diagnosis not present

## 2014-07-01 ENCOUNTER — Other Ambulatory Visit: Payer: Self-pay | Admitting: Internal Medicine

## 2014-07-15 ENCOUNTER — Other Ambulatory Visit: Payer: Self-pay | Admitting: Internal Medicine

## 2014-08-11 DIAGNOSIS — C61 Malignant neoplasm of prostate: Secondary | ICD-10-CM | POA: Diagnosis not present

## 2014-08-18 DIAGNOSIS — N200 Calculus of kidney: Secondary | ICD-10-CM | POA: Diagnosis not present

## 2014-08-18 DIAGNOSIS — C61 Malignant neoplasm of prostate: Secondary | ICD-10-CM | POA: Diagnosis not present

## 2014-08-18 DIAGNOSIS — N5201 Erectile dysfunction due to arterial insufficiency: Secondary | ICD-10-CM | POA: Diagnosis not present

## 2014-08-18 DIAGNOSIS — D3001 Benign neoplasm of right kidney: Secondary | ICD-10-CM | POA: Diagnosis not present

## 2014-09-08 DIAGNOSIS — L821 Other seborrheic keratosis: Secondary | ICD-10-CM | POA: Diagnosis not present

## 2014-09-08 DIAGNOSIS — L812 Freckles: Secondary | ICD-10-CM | POA: Diagnosis not present

## 2014-09-08 DIAGNOSIS — D1801 Hemangioma of skin and subcutaneous tissue: Secondary | ICD-10-CM | POA: Diagnosis not present

## 2014-09-08 DIAGNOSIS — Z85828 Personal history of other malignant neoplasm of skin: Secondary | ICD-10-CM | POA: Diagnosis not present

## 2014-10-14 DIAGNOSIS — C61 Malignant neoplasm of prostate: Secondary | ICD-10-CM | POA: Diagnosis not present

## 2014-10-18 ENCOUNTER — Other Ambulatory Visit: Payer: Self-pay | Admitting: Internal Medicine

## 2014-10-21 DIAGNOSIS — N200 Calculus of kidney: Secondary | ICD-10-CM | POA: Diagnosis not present

## 2014-10-21 DIAGNOSIS — D3001 Benign neoplasm of right kidney: Secondary | ICD-10-CM | POA: Diagnosis not present

## 2014-10-21 DIAGNOSIS — C61 Malignant neoplasm of prostate: Secondary | ICD-10-CM | POA: Diagnosis not present

## 2014-11-14 ENCOUNTER — Telehealth: Payer: Self-pay | Admitting: *Deleted

## 2014-11-14 DIAGNOSIS — E785 Hyperlipidemia, unspecified: Secondary | ICD-10-CM

## 2014-11-14 DIAGNOSIS — E119 Type 2 diabetes mellitus without complications: Secondary | ICD-10-CM

## 2014-11-14 NOTE — Telephone Encounter (Signed)
-----   Message from [place], DO sent at 11/14/2014  4:08 PM EDT ----- Regarding: labs I suggest the following labs BMET, CBCD - 401.9 FLP, LFTs, TSH - 272.4

## 2014-11-17 ENCOUNTER — Other Ambulatory Visit (INDEPENDENT_AMBULATORY_CARE_PROVIDER_SITE_OTHER): Payer: Medicare Other

## 2014-11-17 DIAGNOSIS — E119 Type 2 diabetes mellitus without complications: Secondary | ICD-10-CM

## 2014-11-17 DIAGNOSIS — I10 Essential (primary) hypertension: Secondary | ICD-10-CM

## 2014-11-17 DIAGNOSIS — E785 Hyperlipidemia, unspecified: Secondary | ICD-10-CM | POA: Diagnosis not present

## 2014-11-17 LAB — CBC WITH DIFFERENTIAL/PLATELET
Basophils Absolute: 0 10*3/uL (ref 0.0–0.1)
Basophils Relative: 0.5 % (ref 0.0–3.0)
EOS ABS: 0.2 10*3/uL (ref 0.0–0.7)
Eosinophils Relative: 2.6 % (ref 0.0–5.0)
HCT: 44.8 % (ref 39.0–52.0)
Hemoglobin: 15 g/dL (ref 13.0–17.0)
LYMPHS PCT: 31.5 % (ref 12.0–46.0)
Lymphs Abs: 2.4 10*3/uL (ref 0.7–4.0)
MCHC: 33.5 g/dL (ref 30.0–36.0)
MCV: 92.6 fl (ref 78.0–100.0)
MONOS PCT: 8.3 % (ref 3.0–12.0)
Monocytes Absolute: 0.6 10*3/uL (ref 0.1–1.0)
NEUTROS ABS: 4.4 10*3/uL (ref 1.4–7.7)
Neutrophils Relative %: 57.1 % (ref 43.0–77.0)
PLATELETS: 210 10*3/uL (ref 150.0–400.0)
RBC: 4.83 Mil/uL (ref 4.22–5.81)
RDW: 14.4 % (ref 11.5–15.5)
WBC: 7.7 10*3/uL (ref 4.0–10.5)

## 2014-11-17 LAB — TSH: TSH: 1.24 u[IU]/mL (ref 0.35–4.50)

## 2014-11-17 LAB — LIPID PANEL
CHOL/HDL RATIO: 4
Cholesterol: 175 mg/dL (ref 0–200)
HDL: 40.4 mg/dL (ref >39.00)
LDL Cholesterol: 108 mg/dL — ABNORMAL HIGH (ref 0–99)
NONHDL: 135.09
Triglycerides: 136 mg/dL (ref 0.0–149.0)
VLDL: 27.2 mg/dL (ref 0.0–40.0)

## 2014-11-17 LAB — HEPATIC FUNCTION PANEL
ALK PHOS: 91 U/L (ref 39–117)
ALT: 21 U/L (ref 0–53)
AST: 19 U/L (ref 0–37)
Albumin: 4 g/dL (ref 3.5–5.2)
BILIRUBIN DIRECT: 0.1 mg/dL (ref 0.0–0.3)
Total Bilirubin: 0.5 mg/dL (ref 0.2–1.2)
Total Protein: 7.2 g/dL (ref 6.0–8.3)

## 2014-11-17 LAB — BASIC METABOLIC PANEL
BUN: 29 mg/dL — ABNORMAL HIGH (ref 6–23)
CHLORIDE: 109 meq/L (ref 96–112)
CO2: 26 meq/L (ref 19–32)
Calcium: 10.4 mg/dL (ref 8.4–10.5)
Creatinine, Ser: 1.07 mg/dL (ref 0.40–1.50)
GFR: 71.88 mL/min (ref >60.00)
GLUCOSE: 103 mg/dL — AB (ref 70–99)
Potassium: 4.5 mEq/L (ref 3.5–5.1)
Sodium: 142 mEq/L (ref 135–145)

## 2014-11-21 ENCOUNTER — Ambulatory Visit: Payer: Medicare Other | Admitting: Internal Medicine

## 2014-11-24 ENCOUNTER — Ambulatory Visit (INDEPENDENT_AMBULATORY_CARE_PROVIDER_SITE_OTHER): Payer: Medicare Other | Admitting: Family Medicine

## 2014-11-24 ENCOUNTER — Encounter: Payer: Self-pay | Admitting: Family Medicine

## 2014-11-24 VITALS — BP 130/72 | HR 91 | Temp 98.4°F | Ht 72.0 in | Wt 215.3 lb

## 2014-11-24 DIAGNOSIS — E785 Hyperlipidemia, unspecified: Secondary | ICD-10-CM

## 2014-11-24 DIAGNOSIS — R739 Hyperglycemia, unspecified: Secondary | ICD-10-CM | POA: Diagnosis not present

## 2014-11-24 DIAGNOSIS — I10 Essential (primary) hypertension: Secondary | ICD-10-CM

## 2014-11-24 DIAGNOSIS — M25511 Pain in right shoulder: Secondary | ICD-10-CM | POA: Diagnosis not present

## 2014-11-24 DIAGNOSIS — Z23 Encounter for immunization: Secondary | ICD-10-CM | POA: Diagnosis not present

## 2014-11-24 NOTE — Progress Notes (Signed)
Pre visit review using our clinic review tool, if applicable. No additional management support is needed unless otherwise documented below in the visit note. 

## 2014-11-24 NOTE — Progress Notes (Signed)
Subjective:    Patient ID: David [name], male    DOB: 10/13/1940, 74 y.o.   MRN: [id number]  HPI Patient here for medical follow-up in the absence of his primary. He has history of hyperlipidemia, hypertension, GERD, and history of prostate cancer with prior robotic surgery. He is also complaining today of new problems including trigger finger involving the right ring finger. This is not much bother to him thus far. Also having right shoulder pain for about one month. Improved with heat and Aleve. Exacerbated with abduction and internal rotation. No weakness. No recent injury. No cervical radiculopathy symptoms.  Past Medical History  Diagnosis Date  . Hyperlipidemia   . Hypertension   . LBP (low back pain) 06/2005    L5 radicular symptoms; MRI of LS spine severe spondylosis at L4-L5 with central cancal stenosis   . History of kidney stones     2007  . Spermatocele   . BPH (benign prostatic hypertrophy)   . Organic impotence   . Nocturia   . Arthritis     gen.  and left hip  . Wears glasses   . Prostate cancer 10/2011    (Low grade) [facility] - Dr. Junious Silk  . GERD (gastroesophageal reflux disease)     occasional  . Kidney tumor   . Pyelonephritis 03/05/2013  . Renal oncocytoma of right kidney 01/23/2013    As 3.2 x 3.5 cm enhancing exophytic mass projecting off the lower pole. This is consistent with a renal cell carcinoma. No retroperitoneal lymphadenopathy.   [name] 03/05/2013   Past Surgical History  Procedure Laterality Date  . Colonoscopy  02/15/2006, 2014    Internal hemorrhoids (Dr [name]), last in 2014  . Repair umbilical and ventral hernia's w/ mesh  08-21-2009  . Transurethral resection of prostate  AGE 38  . Tonsillectomy and adenoidectomy  as child  . Inguinal hernia repair Right 1980  . Spermatocelectomy Left 12/18/2012    Procedure: LEFT SPERMATOCELECTOMY;  Surgeon: [name], MD;  Location: [facility];  Service:  Urology;  Laterality: Left;  . Prostate biopsy N/A 12/18/2012    Procedure: BIOPSY TRANSRECTAL ULTRASONIC PROSTATE (TUBP);  Surgeon: [name], MD;  Location: [facility];  Service: Urology;  Laterality: N/A;  . Prostate biopsy  12/05/11    gleason 6, 3/12 cores  . Robotic assited partial nephrectomy Right 02/15/2013    Procedure: ROBOTIC ASSITED PARTIAL NEPHRECTOMY;  Surgeon: [name], MD;  Location: [facility];  Service: Urology;  Laterality: Right;  . Robot assisted laparoscopic radical prostatectomy N/A 02/15/2013    Procedure: ROBOTIC ASSISTED LAPAROSCOPIC RADICAL PROSTATECTOMY;  Surgeon: [name], MD;  Location: [facility];  Service: Urology;  Laterality: N/A;  . Lymphadenectomy Bilateral 02/15/2013    Procedure: LYMPHADENECTOMY WITH INDOCYANINE GREEN DYE INJECTION;  Surgeon: [name], MD;  Location: [facility];  Service: Urology;  Laterality: Bilateral;    reports that he quit smoking about 33 years ago. His smoking use included Cigarettes. He has a 2.5 pack-year smoking history. He has never used smokeless tobacco. He reports that he drinks alcohol. He reports that he does not use illicit drugs. family history includes Cancer in his mother; Colon cancer (age of onset: 39) in his mother; Coronary artery disease in his other; Hyperlipidemia in his other; Pulmonary fibrosis in his father. Allergies  Allergen Reactions  . Ace Inhibitors Other (See Comments)    REACTION: Cough  . Codeine     Severe hiccups  .  Percocet [Oxycodone-Acetaminophen]     Severe hiccups      Review of Systems  Constitutional: Negative for fatigue.  Eyes: Negative for visual disturbance.  Respiratory: Negative for cough, chest tightness and shortness of breath.   Cardiovascular: Negative for chest pain, palpitations and leg swelling.  Neurological: Negative for dizziness, syncope, weakness, light-headedness and headaches.       Objective:   Physical Exam    Constitutional: He is oriented to person, place, and time. He appears well-developed and well-nourished.  HENT:  Right Ear: External ear normal.  Left Ear: External ear normal.  Mouth/Throat: Oropharynx is clear and moist.  Eyes: Pupils are equal, round, and reactive to light.  Neck: Neck supple. No thyromegaly present.  Cardiovascular: Normal rate and regular rhythm.   Pulmonary/Chest: Effort normal and breath sounds normal. No respiratory distress. He has no wheezes. He has no rales.  Musculoskeletal: He exhibits no edema.  No localized right shoulder tenderness. He has pain with abduction against resistance and minimal internal rotation. No biceps tenderness. No acromioclavicular tenderness.  Slightly tender nodule right hand ring finger  Neurological: He is alert and oriented to person, place, and time.          Assessment & Plan:  #1 hypertension stable and at goal #2 hyperlipidemia. Recent lipids reviewed with patient and fairly well controlled #3 trigger finger right ring finger. We reviewed options including steroid injection and at this point he wishes to observe #4 right shoulder pain. Suspect rotator cuff tendinitis. Minimal symptoms thus far. We offered steroid injection along with physical therapy and he prefers to observe #5 health maintenance. Flu vaccine given

## 2014-11-24 NOTE — Patient Instructions (Signed)
Rotator Cuff Tendinitis  Rotator cuff tendinitis is inflammation of the tough, cord-like bands that connect muscle to bone (tendons) in your rotator cuff. Your rotator cuff is the collection of all the muscles and tendons that connect your arm to your shoulder. Your rotator cuff holds the head of your upper arm bone (humerus) in the cup (fossa) of your shoulder blade (scapula). CAUSES Rotator cuff tendinitis is usually caused by overusing the joint involved.  SIGNS AND SYMPTOMS  Deep ache in the shoulder also felt on the outside upper arm over the shoulder muscle.  Point tenderness over the area that is injured.  Pain comes on gradually and becomes worse with lifting the arm to the side (abduction) or turning it inward (internal rotation).  May lead to a chronic tear: When a rotator cuff tendon becomes inflamed, it runs the risk of losing its blood supply, causing some tendon fibers to die. This increases the risk that the tendon can fray and partially or completely tear. DIAGNOSIS Rotator cuff tendinitis is diagnosed by taking a medical history, performing a physical exam, and reviewing results of imaging exams. The medical history is useful to help determine the type of rotator cuff injury. The physical exam will include looking at the injured shoulder, feeling the injured area, and watching you do range-of-motion exercises. X-ray exams are typically done to rule out other causes of shoulder pain, such as fractures. MRI is the imaging exam usually used for significant shoulder injuries. Sometimes a dye study called CT arthrogram is done, but it is not as widely used as MRI. In some institutions, special ultrasound tests may also be used to aid in the diagnosis. TREATMENT  Less Severe Cases  Use of a sling to rest the shoulder for a short period of time. Prolonged use of the sling can cause stiffness, weakness, and loss of motion of the shoulder joint.  Anti-inflammatory medicines, such as  ibuprofen or naproxen sodium, may be prescribed. More Severe Cases  Physical therapy.  Use of steroid injections into the shoulder joint.  Surgery. HOME CARE INSTRUCTIONS   Use a sling or splint until the pain decreases. Prolonged use of the sling can cause stiffness, weakness, and loss of motion of the shoulder joint.  Apply ice to the injured area:  Put ice in a plastic bag.  Place a towel between your skin and the bag.  Leave the ice on for 20 minutes, 2-3 times a day.  Try to avoid use other than gentle range of motion while your shoulder is painful. Use the shoulder and exercise only as directed by your health care David [name]. Stop exercises or range of motion if pain or discomfort increases, unless directed otherwise by your health care David [name].  Only take over-the-counter or prescription medicines for pain, discomfort, or fever as directed by your health care David [name].  If you were given a shoulder sling and straps (immobilizer), do not remove it except as directed, or until you see a health care David [name] for a follow-up exam. If you need to remove it, move your arm as little as possible or as directed.  You may want to sleep on several pillows at night to lessen swelling and pain. SEEK IMMEDIATE MEDICAL CARE IF:   Your shoulder pain increases or new pain develops in your arm, hand, or fingers and is not relieved with medicines.  You have new, unexplained symptoms, especially increased numbness in the hands or loss of strength.  You develop any worsening of the problems  that brought you in for care.  Your arm, hand, or fingers are numb or tingling.  Your arm, hand, or fingers are swollen, painful, or turn white or blue. MAKE SURE YOU:  Understand these instructions.  Will watch your condition.  Will get help right away if you are not doing well or get worse. Document Released: 05/07/2003 Document Revised: 12/05/2012 Document Reviewed: 09/26/2012 [facility] Patient  Information 2015 [place], [place]. This information is not intended to replace advice given to you by your health care David [name]. Make sure you discuss any questions you have with your health care David [name].

## 2014-12-10 ENCOUNTER — Ambulatory Visit (INDEPENDENT_AMBULATORY_CARE_PROVIDER_SITE_OTHER): Payer: Medicare Other | Admitting: Family Medicine

## 2014-12-10 ENCOUNTER — Encounter: Payer: Self-pay | Admitting: Family Medicine

## 2014-12-10 VITALS — BP 150/70 | HR 81 | Temp 98.7°F | Wt 215.0 lb

## 2014-12-10 DIAGNOSIS — M7581 Other shoulder lesions, right shoulder: Secondary | ICD-10-CM

## 2014-12-10 DIAGNOSIS — M25511 Pain in right shoulder: Secondary | ICD-10-CM

## 2014-12-10 MED ORDER — METHYLPREDNISOLONE ACETATE PF 80 MG/ML IJ SUSP
80.0000 mg | Freq: Once | INTRAMUSCULAR | Status: AC
  Administered 2014-12-10: 80 mg via INTRA_ARTICULAR

## 2014-12-10 NOTE — Patient Instructions (Signed)
Rotator Cuff Tendinitis  Rotator cuff tendinitis is inflammation of the tough, cord-like bands that connect muscle to bone (tendons) in your rotator cuff. Your rotator cuff is the collection of all the muscles and tendons that connect your arm to your shoulder. Your rotator cuff holds the head of your upper arm bone (humerus) in the cup (fossa) of your shoulder blade (scapula).  CAUSES  Rotator cuff tendinitis is usually caused by overusing the joint involved.   SIGNS AND SYMPTOMS  · Deep ache in the shoulder also felt on the outside upper arm over the shoulder muscle.  · Point tenderness over the area that is injured.  · Pain comes on gradually and becomes worse with lifting the arm to the side (abduction) or turning it inward (internal rotation).  · May lead to a chronic tear: When a rotator cuff tendon becomes inflamed, it runs the risk of losing its blood supply, causing some tendon fibers to die. This increases the risk that the tendon can fray and partially or completely tear.  DIAGNOSIS  Rotator cuff tendinitis is diagnosed by taking a medical history, performing a physical exam, and reviewing results of imaging exams. The medical history is useful to help determine the type of rotator cuff injury. The physical exam will include looking at the injured shoulder, feeling the injured area, and watching you do range-of-motion exercises. X-ray exams are typically done to rule out other causes of shoulder pain, such as fractures. MRI is the imaging exam usually used for significant shoulder injuries. Sometimes a dye study called CT arthrogram is done, but it is not as widely used as MRI. In some institutions, special ultrasound tests may also be used to aid in the diagnosis.  TREATMENT   Less Severe Cases  · Use of a sling to rest the shoulder for a short period of time. Prolonged use of the sling can cause stiffness, weakness, and loss of motion of the shoulder joint.  · Anti-inflammatory medicines, such as  ibuprofen or naproxen sodium, may be prescribed.  More Severe Cases  · Physical therapy.  · Use of steroid injections into the shoulder joint.  · Surgery.  HOME CARE INSTRUCTIONS   · Use a sling or splint until the pain decreases. Prolonged use of the sling can cause stiffness, weakness, and loss of motion of the shoulder joint.  · Apply ice to the injured area:    Put ice in a plastic bag.    Place a towel between your skin and the bag.    Leave the ice on for 20 minutes, 2-3 times a day.  · Try to avoid use other than gentle range of motion while your shoulder is painful. Use the shoulder and exercise only as directed by your health care provider. Stop exercises or range of motion if pain or discomfort increases, unless directed otherwise by your health care provider.  · Only take over-the-counter or prescription medicines for pain, discomfort, or fever as directed by your health care provider.  · If you were given a shoulder sling and straps (immobilizer), do not remove it except as directed, or until you see a health care provider for a follow-up exam. If you need to remove it, move your arm as little as possible or as directed.  · You may want to sleep on several pillows at night to lessen swelling and pain.  SEEK IMMEDIATE MEDICAL CARE IF:   · Your shoulder pain increases or new pain develops in your arm, hand,   or fingers and is not relieved with medicines.  · You have new, unexplained symptoms, especially increased numbness in the hands or loss of strength.  · You develop any worsening of the problems that brought you in for care.  · Your arm, hand, or fingers are numb or tingling.  · Your arm, hand, or fingers are swollen, painful, or turn white or blue.  MAKE SURE YOU:  · Understand these instructions.  · Will watch your condition.  · Will get help right away if you are not doing well or get worse.     This information is not intended to replace advice given to you by your health care provider. Make sure  you discuss any questions you have with your health care provider.     Document Released: 05/07/2003 Document Revised: 03/07/2014 Document Reviewed: 09/26/2012  [employer] Interactive Patient Education ©2016 [employer]

## 2014-12-10 NOTE — Progress Notes (Signed)
Pre visit review using our clinic review tool, if applicable. No additional management support is needed unless otherwise documented below in the visit note. 

## 2014-12-10 NOTE — Progress Notes (Signed)
Subjective:    Patient ID: David [name], male    DOB: 1940/10/22, 74 y.o.   MRN: [id number]  HPI   Patient seen with persistent right shoulder pain. No injury. He has pain with abduction and internal rotation. Pain somewhat poorly localized. He is not aware of any obvious weakness. He thinks he is having some mild restricted range of motion. Increased night pain. No cervical radiculopathy pain. He's tried over-the-counter medications without improvement. Heat seems to improve his pain.  Past Medical History  Diagnosis Date  . Hyperlipidemia   . Hypertension   . LBP (low back pain) 06/2005    L5 radicular symptoms; MRI of LS spine severe spondylosis at L4-L5 with central cancal stenosis   . History of kidney stones     2007  . Spermatocele   . BPH (benign prostatic hypertrophy)   . Organic impotence   . Nocturia   . Arthritis     gen.  and left hip  . Wears glasses   . Prostate cancer ([place]) 10/2011    (Low grade) [facility] - Dr. Junious Silk  . GERD (gastroesophageal reflux disease)     occasional  . Kidney tumor   . Pyelonephritis 03/05/2013  . Renal oncocytoma of right kidney 01/23/2013    As 3.2 x 3.5 cm enhancing exophytic mass projecting off the lower pole. This is consistent with a renal cell carcinoma. No retroperitoneal lymphadenopathy.   [name] 03/05/2013   Past Surgical History  Procedure Laterality Date  . Colonoscopy  02/15/2006, 2014    Internal hemorrhoids (Dr [name]), last in 2014  . Repair umbilical and ventral hernia's w/ mesh  08-21-2009  . Transurethral resection of prostate  AGE 84  . Tonsillectomy and adenoidectomy  as child  . Inguinal hernia repair Right 1980  . Spermatocelectomy Left 12/18/2012    Procedure: LEFT SPERMATOCELECTOMY;  Surgeon: [name], MD;  Location: [facility];  Service: Urology;  Laterality: Left;  . Prostate biopsy N/A 12/18/2012    Procedure: BIOPSY TRANSRECTAL ULTRASONIC PROSTATE (TUBP);   Surgeon: [name], MD;  Location: [facility];  Service: Urology;  Laterality: N/A;  . Prostate biopsy  12/05/11    gleason 6, 3/12 cores  . Robotic assited partial nephrectomy Right 02/15/2013    Procedure: ROBOTIC ASSITED PARTIAL NEPHRECTOMY;  Surgeon: [name], MD;  Location: [facility];  Service: Urology;  Laterality: Right;  . Robot assisted laparoscopic radical prostatectomy N/A 02/15/2013    Procedure: ROBOTIC ASSISTED LAPAROSCOPIC RADICAL PROSTATECTOMY;  Surgeon: [name], MD;  Location: [facility];  Service: Urology;  Laterality: N/A;  . Lymphadenectomy Bilateral 02/15/2013    Procedure: LYMPHADENECTOMY WITH INDOCYANINE GREEN DYE INJECTION;  Surgeon: [name], MD;  Location: [facility];  Service: Urology;  Laterality: Bilateral;    reports that he quit smoking about 33 years ago. His smoking use included Cigarettes. He has a 2.5 pack-year smoking history. He has never used smokeless tobacco. He reports that he drinks alcohol. He reports that he does not use illicit drugs. family history includes Cancer in his mother; Colon cancer (age of onset: 36) in his mother; Coronary artery disease in his other; Hyperlipidemia in his other; Pulmonary fibrosis in his father. Allergies  Allergen Reactions  . Ace Inhibitors Other (See Comments)    REACTION: Cough  . Codeine     Severe hiccups  . Percocet [Oxycodone-Acetaminophen]     Severe hiccups      Review of Systems  Respiratory: Negative for shortness of breath.   Cardiovascular: Negative for chest pain.  Neurological: Negative for weakness and numbness.       Objective:   Physical Exam  Constitutional: He appears well-developed and well-nourished. No distress.  Cardiovascular: Normal rate and regular rhythm.   Pulmonary/Chest: Effort normal and breath sounds normal. No respiratory distress. He has no wheezes. He has no rales.  Musculoskeletal:  Right shoulder no specific point tenderness. No  acromioclavicular tenderness. No biceps tenderness. He has slightly restricted range of motion with abduction. Pain with abduction against resistance greater than 90 and internal rotation.  Neurological:  Symmetric upper extremity reflexes. He seems to have some mild weakness with rotator cuff strength bilaterally          Assessment & Plan:  Right shoulder pain. Suspect rotator cuff tendinitis. May have very early adhesive capsulitis. Discussed risks and benefits of corticosteroid injection and patient consented.  After prepping skin with betadine, injected 1cc depomedrol and 2 cc of plain xylocaine with 23 gauge one and one half inch needle using posterior lateral approach and pt tolerated well.  He did note some improvement immediately after injection. He will do some icing tonight and we also instructed on gentle range of motion exercises. We discussed possible physical therapy but he would like to see how he responds to injection first.

## 2015-01-21 ENCOUNTER — Ambulatory Visit (INDEPENDENT_AMBULATORY_CARE_PROVIDER_SITE_OTHER): Payer: Medicare Other | Admitting: Family Medicine

## 2015-01-21 ENCOUNTER — Encounter: Payer: Self-pay | Admitting: Family Medicine

## 2015-01-21 VITALS — BP 130/70 | HR 60 | Temp 97.9°F | Resp 16 | Ht 72.0 in | Wt 210.1 lb

## 2015-01-21 DIAGNOSIS — M7581 Other shoulder lesions, right shoulder: Secondary | ICD-10-CM

## 2015-01-21 DIAGNOSIS — M7501 Adhesive capsulitis of right shoulder: Secondary | ICD-10-CM | POA: Diagnosis not present

## 2015-01-21 NOTE — Progress Notes (Signed)
Subjective:    Patient ID: David [name], male    DOB: Mar 07, 1940, 74 y.o.   MRN: [id number]  HPI Follow-up right shoulder pain. Recent steroid injection. Following this he had about 50% improvement but then he did a lot of raking and yard work for several hours last week and now has recurrence of pain. He has some mild restriction of range of motion. Pain mostly exacerbated by internal rotation and abduction. Some night pain. No definite weakness. Denies cervical radiculopathy pain  Past Medical History  Diagnosis Date  . Hyperlipidemia   . Hypertension   . LBP (low back pain) 06/2005    L5 radicular symptoms; MRI of LS spine severe spondylosis at L4-L5 with central cancal stenosis   . History of kidney stones     2007  . Spermatocele   . BPH (benign prostatic hypertrophy)   . Organic impotence   . Nocturia   . Arthritis     gen.  and left hip  . Wears glasses   . Prostate cancer ([place]) 10/2011    (Low grade) [facility] - Dr. Junious Silk  . GERD (gastroesophageal reflux disease)     occasional  . Kidney tumor   . Pyelonephritis 03/05/2013  . Renal oncocytoma of right kidney 01/23/2013    As 3.2 x 3.5 cm enhancing exophytic mass projecting off the lower pole. This is consistent with a renal cell carcinoma. No retroperitoneal lymphadenopathy.   [name] 03/05/2013   Past Surgical History  Procedure Laterality Date  . Colonoscopy  02/15/2006, 2014    Internal hemorrhoids (Dr [name]), last in 2014  . Repair umbilical and ventral hernia's w/ mesh  08-21-2009  . Transurethral resection of prostate  AGE 50  . Tonsillectomy and adenoidectomy  as child  . Inguinal hernia repair Right 1980  . Spermatocelectomy Left 12/18/2012    Procedure: LEFT SPERMATOCELECTOMY;  Surgeon: [name], MD;  Location: [facility];  Service: Urology;  Laterality: Left;  . Prostate biopsy N/A 12/18/2012    Procedure: BIOPSY TRANSRECTAL ULTRASONIC PROSTATE (TUBP);   Surgeon: [name], MD;  Location: [facility];  Service: Urology;  Laterality: N/A;  . Prostate biopsy  12/05/11    gleason 6, 3/12 cores  . Robotic assited partial nephrectomy Right 02/15/2013    Procedure: ROBOTIC ASSITED PARTIAL NEPHRECTOMY;  Surgeon: [name], MD;  Location: [facility];  Service: Urology;  Laterality: Right;  . Robot assisted laparoscopic radical prostatectomy N/A 02/15/2013    Procedure: ROBOTIC ASSISTED LAPAROSCOPIC RADICAL PROSTATECTOMY;  Surgeon: [name], MD;  Location: [facility];  Service: Urology;  Laterality: N/A;  . Lymphadenectomy Bilateral 02/15/2013    Procedure: LYMPHADENECTOMY WITH INDOCYANINE GREEN DYE INJECTION;  Surgeon: [name], MD;  Location: [facility];  Service: Urology;  Laterality: Bilateral;    reports that he quit smoking about 33 years ago. His smoking use included Cigarettes. He has a 2.5 pack-year smoking history. He has never used smokeless tobacco. He reports that he drinks alcohol. He reports that he does not use illicit drugs. family history includes Cancer in his mother; Colon cancer (age of onset: 3) in his mother; Coronary artery disease in his other; Hyperlipidemia in his other; Pulmonary fibrosis in his father. Allergies  Allergen Reactions  . Ace Inhibitors Other (See Comments)    REACTION: Cough  . Codeine     Severe hiccups  . Percocet [Oxycodone-Acetaminophen]     Severe hiccups      Review of  Systems  Cardiovascular: Negative for chest pain.  Musculoskeletal: Negative for neck pain.  Neurological: Negative for weakness and numbness.       Objective:   Physical Exam  Constitutional: He appears well-developed and well-nourished.  Cardiovascular: Normal rate and regular rhythm.   Pulmonary/Chest: Effort normal and breath sounds normal. No respiratory distress. He has no wheezes. He has no rales.  Musculoskeletal:  Right shoulder reveals slightly restricted range of motion with  internal rotation. He is able to abduct the shoulder with full range of motion but has pain with abduction greater than 90 against resistance. Some pain with external rotation as well. No localized shoulder tenderness          Assessment & Plan:  Right shoulder pain. Suspect rotator cuff tendinitis. Possible early adhesive capsulitis. Recent improvement but not resolution with steroid injection. We recommended consideration for repeat corticosteroid injection and if not improving further with this resolving will obtain x-rays and consider orthopedic referral   Discussed risks and benefits of corticosteroid injection and patient consented.  After prepping skin with betadine, injected 1 mL depomedrol and 2 cc of plain xylocaine with 23 gauge one and one half inch needle using posterior lateral approach and pt tolerated well. He noticed some improvement immediately afterwards

## 2015-01-21 NOTE — Patient Instructions (Signed)
Adhesive Capsulitis Adhesive capsulitis is inflammation of the tendons and ligaments that surround the shoulder joint (shoulder capsule). This condition causes the shoulder to become stiff and painful to move. Adhesive capsulitis is also called frozen shoulder. CAUSES This condition may be caused by:  An injury to the shoulder joint.  Straining the shoulder.  Not moving the shoulder for a period of time. This can happen if your arm was injured or in a sling.  Long-standing health problems, such as:  Diabetes.  Thyroid problems.  Heart disease.  Stroke.  Rheumatoid arthritis.  Lung disease. In some cases, the cause may not be known. RISK FACTORS This condition is more likely to develop in:  Women.  People who are older than 73 years of age. SYMPTOMS Symptoms of this condition include:  Pain in the shoulder when moving the arm. There may also be pain when parts of the shoulder are touched. The pain is worse at night or when at rest.  Soreness or aching in the shoulder.  Inability to move the shoulder normally.  Muscle spasms. DIAGNOSIS This condition is diagnosed with a physical exam and imaging tests, such as an X-ray or MRI. TREATMENT This condition may be treated with:  Treatment of the underlying cause or condition.  Physical therapy. This involves performing exercises to get the shoulder moving again.  Medicine. Medicine may be given to relieve pain, inflammation, or muscle spasms.  Steroid injections into the shoulder joint.  Shoulder manipulation. This is a procedure to move the shoulder into another position. It is done after you are given a medicine to make you fall asleep (general anesthetic). The joint may also be injected with salt water at high pressure to break down scarring.  Surgery. This may be done in severe cases when other treatments have failed. Although most people recover completely from adhesive capsulitis, some may not regain the full  movement of the shoulder. HOME CARE INSTRUCTIONS  Take over-the-counter and prescription medicines only as told by your health care provider.  If you are being treated with physical therapy, follow instructions from your physical therapist.  Avoid exercises that put a lot of demand on your shoulder, such as throwing. These exercises can make pain worse.  If directed, apply ice to the injured area:  Put ice in a plastic bag.  Place a towel between your skin and the bag.  Leave the ice on for 20 minutes, 2-3 times per day. SEEK MEDICAL CARE IF:  You develop new symptoms.  Your symptoms get worse.   This information is not intended to replace advice given to you by your health care provider. Make sure you discuss any questions you have with your health care provider.   Document Released: 12/12/2008 Document Revised: 11/05/2014 Document Reviewed: 06/09/2014 Elsevier Interactive Patient Education 2016 [employer]  

## 2015-01-21 NOTE — Progress Notes (Signed)
Pre visit review using our clinic review tool, if applicable. No additional management support is needed unless otherwise documented below in the visit note. 

## 2015-02-24 DIAGNOSIS — H2511 Age-related nuclear cataract, right eye: Secondary | ICD-10-CM | POA: Diagnosis not present

## 2015-02-24 DIAGNOSIS — H18411 Arcus senilis, right eye: Secondary | ICD-10-CM | POA: Diagnosis not present

## 2015-02-24 DIAGNOSIS — H18412 Arcus senilis, left eye: Secondary | ICD-10-CM | POA: Diagnosis not present

## 2015-02-24 DIAGNOSIS — H2512 Age-related nuclear cataract, left eye: Secondary | ICD-10-CM | POA: Diagnosis not present

## 2015-03-05 ENCOUNTER — Other Ambulatory Visit: Payer: Self-pay | Admitting: Internal Medicine

## 2015-04-01 DIAGNOSIS — C61 Malignant neoplasm of prostate: Secondary | ICD-10-CM | POA: Diagnosis not present

## 2015-04-07 DIAGNOSIS — Z0000 Encounter for general adult medical examination without abnormal findings: Secondary | ICD-10-CM | POA: Diagnosis not present

## 2015-04-07 DIAGNOSIS — N200 Calculus of kidney: Secondary | ICD-10-CM | POA: Diagnosis not present

## 2015-04-07 DIAGNOSIS — D3001 Benign neoplasm of right kidney: Secondary | ICD-10-CM | POA: Diagnosis not present

## 2015-04-07 DIAGNOSIS — C61 Malignant neoplasm of prostate: Secondary | ICD-10-CM | POA: Diagnosis not present

## 2015-04-17 DIAGNOSIS — H25012 Cortical age-related cataract, left eye: Secondary | ICD-10-CM | POA: Diagnosis not present

## 2015-04-17 DIAGNOSIS — H2511 Age-related nuclear cataract, right eye: Secondary | ICD-10-CM | POA: Diagnosis not present

## 2015-04-17 DIAGNOSIS — H25812 Combined forms of age-related cataract, left eye: Secondary | ICD-10-CM | POA: Diagnosis not present

## 2015-04-17 DIAGNOSIS — H2512 Age-related nuclear cataract, left eye: Secondary | ICD-10-CM | POA: Diagnosis not present

## 2015-04-24 ENCOUNTER — Other Ambulatory Visit: Payer: Self-pay

## 2015-04-24 MED ORDER — AMLODIPINE-OLMESARTAN 5-20 MG PO TABS: 0.5000 | ORAL_TABLET | Freq: Every day | ORAL | Status: DC

## 2015-05-01 DIAGNOSIS — H2511 Age-related nuclear cataract, right eye: Secondary | ICD-10-CM | POA: Diagnosis not present

## 2015-05-01 DIAGNOSIS — H25011 Cortical age-related cataract, right eye: Secondary | ICD-10-CM | POA: Diagnosis not present

## 2015-05-01 DIAGNOSIS — H25811 Combined forms of age-related cataract, right eye: Secondary | ICD-10-CM | POA: Diagnosis not present

## 2015-05-29 ENCOUNTER — Other Ambulatory Visit: Payer: Self-pay | Admitting: Internal Medicine

## 2015-05-29 MED ORDER — FLUTICASONE PROPIONATE 50 MCG/ACT NA SUSP: 2.0000 | Freq: Every day | NASAL | Status: DC

## 2015-06-25 ENCOUNTER — Encounter: Payer: Self-pay | Admitting: Family Medicine

## 2015-06-25 ENCOUNTER — Ambulatory Visit (INDEPENDENT_AMBULATORY_CARE_PROVIDER_SITE_OTHER): Payer: Medicare Other | Admitting: Family Medicine

## 2015-06-25 VITALS — BP 140/80 | HR 68 | Temp 98.6°F | Wt 215.0 lb

## 2015-06-25 DIAGNOSIS — M7540 Impingement syndrome of unspecified shoulder: Secondary | ICD-10-CM

## 2015-06-25 DIAGNOSIS — E785 Hyperlipidemia, unspecified: Secondary | ICD-10-CM

## 2015-06-25 DIAGNOSIS — M159 Polyosteoarthritis, unspecified: Secondary | ICD-10-CM | POA: Diagnosis not present

## 2015-06-25 DIAGNOSIS — R5383 Other fatigue: Secondary | ICD-10-CM

## 2015-06-25 DIAGNOSIS — K219 Gastro-esophageal reflux disease without esophagitis: Secondary | ICD-10-CM | POA: Diagnosis not present

## 2015-06-25 DIAGNOSIS — I10 Essential (primary) hypertension: Secondary | ICD-10-CM

## 2015-06-25 MED ORDER — DULOXETINE HCL 30 MG PO CPEP: 30.0000 mg | ORAL_CAPSULE | Freq: Every day | ORAL | Status: DC

## 2015-06-25 MED ORDER — AMLODIPINE-OLMESARTAN 5-20 MG PO TABS: 1.0000 | ORAL_TABLET | Freq: Every day | ORAL | Status: DC

## 2015-06-25 NOTE — Patient Instructions (Signed)
A few things to remember from today's visit:   1. Essential hypertension  - BASIC METABOLIC PANEL WITH GFR - amLODipine-olmesartan (AZOR) 5-20 MG tablet; Take 1 tablet by mouth at bedtime.  Dispense: 90 tablet; Refill: 1  2. Hyperlipidemia   3. Gastroesophageal reflux disease without esophagitis   4. Generalized osteoarthritis of multiple sites  - DULoxetine (CYMBALTA) 30 MG capsule; Take 1 capsule (30 mg total) by mouth daily.  Dispense: 30 capsule; Refill: 1 - Ambulatory referral to Physical Therapy  5. Impingement syndrome, shoulder, unspecified laterality  - Ambulatory referral to Physical Therapy   Blood pressure med increased.  Check BP at home. Low salt diet. DASH diet recommended: high in vegetables, fruits, low-fat dairy products, whole grains, poultry, fish, and nuts; and low in sweets, sugar-sweetened beverages, and red meats.  Cymbalta might help with joint pain. Caution with Aleve and NSAID's.   If you sign-up for My chart, you can communicate easier with [place] in case you have any question or concern.

## 2015-06-25 NOTE — Progress Notes (Signed)
Pre visit review using our clinic review tool, if applicable. No additional management support is needed unless otherwise documented below in the visit note. 

## 2015-06-25 NOTE — Progress Notes (Signed)
HPI:  David [name] is a 75 y.o.male , who is here with his wife today to establish care with me.  Last  Physical:10/2014. He lives with wife. ADL's Independent, except for occasional urine leakage. IDL's Independent.   He history of HTN,HLD,OA,GERD, and s/p prostate cancer. S/P prostectomy, he follows with urologists annually and next appt 02/2016.  Has the following chronic problems that require follow up and concerns today:  Hypertension: Currently he is on Azor 5-20 mg 1/2 tab daily.  He taking medications as instructed, no side effects reported. He does not exercise regularly but he is active around his house. He follows a healthy diet, wife cooks. BP readings at home 140-150/80's.   He has not noted unusual headache, visual changes, exertional chest pain, dyspnea,  focal weakness, or  Changes in baseline edema.  Lab Results  Component Value Date   CREATININE 1.07 11/17/2014   BUN 29* 11/17/2014   NA 142 11/17/2014   K 4.5 11/17/2014   CL 109 11/17/2014   CO2 26 11/17/2014   Lab Results  Component Value Date   ALT 21 11/17/2014   AST 19 11/17/2014   ALKPHOS 91 11/17/2014   BILITOT 0.5 11/17/2014     Arthralgias: He is reporting Hx of generalized joint pain, worse for the past 6 months, mainly shoulders and knees. Right shoulder pain for a year, getting worse and steroid injections helped some.  Pain is  Intermittent,5/10, intermittently, dull/ache, no radiated, no upper extremities numbness or tingling. Aleve helps, he takes 2 tabs every morning. + Joint edema and limitation ROM.  Pain is exacerbated by certain activities and relieved by rest/NSAID's. Also intermittent back pain, does not bother him much, hx of DDD.  He also c/o fatigue, which he has ahd for years but wife thinks it may be getting worse. Takes naps during the day, do not help much with fatigue. Remote hx of depression, denies any symptom. + Sleep disturbance due to frequent  urination. He has not noted sleep apnea.   Lab Results  Component Value Date   WBC 7.7 11/17/2014   HGB 15.0 11/17/2014   HCT 44.8 11/17/2014   MCV 92.6 11/17/2014   PLT 210.0 11/17/2014   Lab Results  Component Value Date   TSH 1.24 11/17/2014    Hyperlipidemia: He is on Lipitor 10 mg daily. Following a low fat diet. He has not noted side effects with medication.  Lab Results  Component Value Date   CHOL 175 11/17/2014   HDL 40.40 11/17/2014   LDLCALC 108* 11/17/2014   LDLDIRECT 122.1 03/05/2012   TRIG 136.0 11/17/2014   CHOLHDL 4 11/17/2014   GERD: He takes Omeprazole 20 mg 2-3 times per week when he has heartburn. He has taken it for years. No abdominal pain, nausea, vomiting, or changes in bowel habits.   REVIEW OF SYSTEMS:  General: Negative for fever, abnormal weight loss, or changes in appetite.+ Fatigue.  Eyes: Negative for conjunctival erythema, or visual changes.  ENT: Negative for earache, hearing loss, or ear drainage. + occasional rhinorrhea and nasal congestion (hx of allergic rhinitis). Negative for sinus pain or epistaxis. Negative for oral lesions, odynophagia, dysphagia.  Neck: negative for swollen glands or masses.  Cardiac: Negative for chest pain, exertional dyspnea, irregular HR, claudication, cold extremities, or worsening edema.  Respiratory: Negative for cough, dyspnea, wheezing, or apnea. Former smoker.  Abdomen: Negative for abdominal pain, changes in bowel habits, blood in stool or melena, nausea, or vomiting.Occasional  heartburn.  GU: Negative for dysuria, urinary frequency, urinary urgency, gross hematuria. Occasional urine incontinence. Nocturia x 3-4.  MS:  + arthralgias. + Occasional back pain. + Joint edema, joint stiffness, and limitation of ROM.  No joint erythema.   Neurologic: No confusion,  focal weakness, numbness or tingling, frequent/severe headaches, or tremor.  Psychiatric: Denies depression or anxiety. +  sleep disorder.  Skin: Negative for rash, ulcers, or skin color changes.    Past Medical History  Diagnosis Date  . Hyperlipidemia   . Hypertension   . LBP (low back pain) 06/2005    L5 radicular symptoms; MRI of LS spine severe spondylosis at L4-L5 with central cancal stenosis   . History of kidney stones     2007  . Spermatocele   . BPH (benign prostatic hypertrophy)   . Organic impotence   . Nocturia   . Arthritis     gen.  and left hip  . Wears glasses   . Prostate cancer ([place]) 10/2011    (Low grade) [facility] - Dr. Junious [name]  . GERD (gastroesophageal reflux disease)     occasional  . Kidney tumor   . Pyelonephritis 03/05/2013  . Renal oncocytoma of right kidney 01/23/2013    As 3.2 x 3.5 cm enhancing exophytic mass projecting off the lower pole. This is consistent with a renal cell carcinoma. No retroperitoneal lymphadenopathy.   David [name] 03/05/2013    Past Surgical History  Procedure Laterality Date  . Colonoscopy  02/15/2006, 2014    Internal hemorrhoids (Dr [name]), last in 2014  . Repair umbilical and ventral hernia's w/ mesh  08-21-2009  . Transurethral resection of prostate  AGE 65  . Tonsillectomy and adenoidectomy  as child  . Inguinal hernia repair Right 1980  . Spermatocelectomy Left 12/18/2012    Procedure: LEFT SPERMATOCELECTOMY;  Surgeon: David Bonine, MD;  [facility];  Service: Urology;  Laterality: Left;  . Prostate biopsy N/A 12/18/2012    Procedure: BIOPSY TRANSRECTAL ULTRASONIC PROSTATE (TUBP);  Surgeon: David Bonine, MD;  Location: [facility];  Service: Urology;  Laterality: N/A;  . Prostate biopsy  12/05/11    gleason 6, 3/12 cores  . Robotic assited partial nephrectomy Right 02/15/2013    Procedure: ROBOTIC ASSITED PARTIAL NEPHRECTOMY;  Surgeon: David Frock, MD;  Location: [facility];  Service: Urology;  Laterality: Right;  . Robot assisted laparoscopic radical prostatectomy N/A  02/15/2013    Procedure: ROBOTIC ASSISTED LAPAROSCOPIC RADICAL PROSTATECTOMY;  Surgeon: David Frock, MD;  Location: [facility];  Service: Urology;  Laterality: N/A;  . Lymphadenectomy Bilateral 02/15/2013    Procedure: LYMPHADENECTOMY WITH INDOCYANINE GREEN DYE INJECTION;  Surgeon: David Frock, MD;  Location: [facility];  Service: Urology;  Laterality: Bilateral;    Family History  Problem Relation Age of Onset  . Cancer Mother     colon  . Colon cancer Mother 51  . Pulmonary fibrosis Father     father owned a Radio broadcast assistant (father blamed inhalation of chemicals and fumes)  . Coronary artery disease Other   . Hyperlipidemia Other     Social History   Social History  . Marital Status: Married    Spouse Name: N/A  . Number of Children: N/A  . Years of Education: N/A   Social History Main Topics  . Smoking status: Former Smoker -- 0.25 packs/day for 10 years    Types: Cigarettes    Quit date: 02/28/1981  . Smokeless tobacco: Never Used  .  Alcohol Use: 0.0 oz/week     Comment: occasional  . Drug Use: No  . Sexual Activity: Not Asked   Other Topics Concern  . None   Social History Narrative   Married- 23 yrs   2 daughters   5 grandchildren   Never Smoked   Alcohol use-yes (occasional/social)   Retired- Engineer, water      Physician roster:      Urologist - Dr. [name]   Orthopedics - Dr. [name]   Dermatology - Dr. [name]    Current Outpatient Prescriptions on File Prior to Visit  Medication Sig Dispense Refill  . aspirin EC 81 MG tablet Take 81 mg by mouth daily.    [name] atorvastatin (LIPITOR) 10 MG tablet take 1 tablet by mouth once daily 90 tablet 3  . fluticasone (FLONASE) 50 MCG/ACT nasal spray Place 2 sprays into both nostrils daily. 16 g 3  . omeprazole (PRILOSEC) 20 MG capsule take 1 capsule by mouth once daily 90 capsule 1  . sodium chloride (OCEAN) 0.65 % nasal spray Place 1 spray into the nose daily as needed for congestion.     No current  facility-administered medications on file prior to visit.     EXAM:  Filed Vitals:   06/25/15 0957  BP: 140/80  Pulse: 68  Temp: 98.6 F (37 C)    Body mass index is 29.15 kg/(m^2).   GENERAL: vitals reviewed and listed above. Well developed, appears well hydrated and in no acute distress.  ENT: atraumatic, conjunttiva clear, PERRL and EOMI bilateral. Moist oral mucosa, no lesions appreciated.   NECK: no obvious masses on inspection. No lymphadenopathy (cervical or supraclavicular bilateral).  LUNGS: clear to auscultation bilaterally, no wheezes, rales or rhonchi, good air movement  CV: Regular rate and rhythm, no murmurs appreciated. Trace pitting LE edema, bilateral. Radial,ulnar, and DP pulses present bilateral.  ABDOMEN: Soft, no tender, no masses or hepatomegaly appreciated.  MS: Shoulder bilateral abnormalities,R>L: No deformity, edema, or erythema appreciated.No muscle atrophy. Luan Pulling' test +, drop arm rotator cuff test neg, empty can supraspinatus test pos, cross body adduction test neg, lift-Off Subscapularis test pos. ROM limited (active and passive). Hands: joint edema R>>L, no erythema or major deformities. Right IP joints limitation of flexion. Wrist: limitation of flexion bilateral. Knee crepitus bilateral, R>L, limitation flexion bilateral.  NEURO: Alert and oriented x 3, no focal deficit appreciated, gait stable with no assistance needed.  PSYCH: pleasant and cooperative, no obvious depression or anxiety.    ASSESSMENT AND PLAN:  Discussed the following assessment and plan:   Generalized osteoarthritis of multiple sites  Worsening. PT may be beneficial, so recommended. Caution with some activities around his house, fall precautions. Some side effects of NSAID's discussed. Cymbalta recommended, some side effects discussed. F/U in 4-6 weeks, before if needed.  - Plan: DULoxetine (CYMBALTA) 30 MG capsule, Ambulatory referral to Physical  Therapy  Essential hypertension  SBP readings at home mildly elevated. Here BP re-check 140/80. He will try Azor whole tab instead 1/2, monitor BP at home. Possible complications of elevated BP discussed as well as side effects of antihypertensive meds. Annual eye examination. F/U in 6 weeks.   - Plan: BASIC METABOLIC PANEL WITH GFR, amLODipine-olmesartan (AZOR) 5-20 MG tablet  Hyperlipidemia Stable. No new recommendations today. No changes in current management. F/U in 5-6 months.  Gastroesophageal reflux disease without esophagitis GERD precautions discussed. Some side effects of PPI's and NSAID's reviewed. For now continue Omeprazole as needed.  Impingement syndrome,  shoulder, unspecified laterality   Treatment options discussed. He is not interested in surgical procedures for now but might need ortho evaluation if not better with PT. We may need to consider pain management, according to wife, he tried Tramadol before and did not help much.   - Plan: Ambulatory referral to Physical Therapy  Other fatigue: Chronic. We discussed possible causes: he has interrupted sleep due to nocturia, remote hx of depression (denies any symptom), and chronic illness + meds. Cymbalta may help. Try to decrease fluid intake in the afternoon. We could consider sleep study at some point.  -We reviewed the PMH, PSH, FH, SH, Meds and Allergies. -We provided refills for any medications we will prescribe as needed. -We addressed current concerns per orders and patient instructions.   -Patient advised to return or notify a doctor immediately if symptoms worsen or persist or new concerns arise.      Henley Blyth G. [name], MD  [facility]. [place] office.

## 2015-06-29 ENCOUNTER — Other Ambulatory Visit: Payer: Self-pay | Admitting: *Deleted

## 2015-06-29 ENCOUNTER — Other Ambulatory Visit (INDEPENDENT_AMBULATORY_CARE_PROVIDER_SITE_OTHER): Payer: Medicare Other

## 2015-06-29 DIAGNOSIS — I10 Essential (primary) hypertension: Secondary | ICD-10-CM

## 2015-06-29 LAB — BASIC METABOLIC PANEL
BUN: 21 mg/dL (ref 6–23)
CHLORIDE: 106 meq/L (ref 96–112)
CO2: 21 meq/L (ref 19–32)
Calcium: 10.2 mg/dL (ref 8.4–10.5)
Creatinine, Ser: 0.88 mg/dL (ref 0.40–1.50)
GFR: 89.92 mL/min (ref >60.00)
Glucose, Bld: 101 mg/dL — ABNORMAL HIGH (ref 70–99)
Potassium: 4.2 mEq/L (ref 3.5–5.1)
SODIUM: 135 meq/L (ref 135–145)

## 2015-06-29 MED ORDER — ATORVASTATIN CALCIUM 10 MG PO TABS: 10.0000 mg | ORAL_TABLET | Freq: Every day | ORAL | Status: DC

## 2015-07-01 ENCOUNTER — Ambulatory Visit: Payer: Medicare Other | Attending: Family Medicine

## 2015-07-01 DIAGNOSIS — M25562 Pain in left knee: Secondary | ICD-10-CM | POA: Diagnosis not present

## 2015-07-01 DIAGNOSIS — M25612 Stiffness of left shoulder, not elsewhere classified: Secondary | ICD-10-CM

## 2015-07-01 DIAGNOSIS — M25611 Stiffness of right shoulder, not elsewhere classified: Secondary | ICD-10-CM | POA: Insufficient documentation

## 2015-07-01 DIAGNOSIS — M25512 Pain in left shoulder: Secondary | ICD-10-CM

## 2015-07-01 DIAGNOSIS — M25511 Pain in right shoulder: Secondary | ICD-10-CM | POA: Insufficient documentation

## 2015-07-01 DIAGNOSIS — M25561 Pain in right knee: Secondary | ICD-10-CM

## 2015-07-01 NOTE — Patient Instructions (Signed)
KNEE: Extension, Long Arc Quads - Sitting    Raise leg until knee is straight.  Hold 5 seconds _10__ reps per set, __3_ sets per day, ___ days per week  HIP: Hamstrings - Short Sitting     Keep knee straight. Lift chest. Hold _20__ seconds. _3__ reps per set, __3_ sets per day, ___ days per week  Copyright  VHI. All rights reserved.  Flexion (Assistive)    Clasp hands together and raise arms above head, keeping elbows as straight as possible. Can be done sitting or lying. Repeat ____ times. Do ____ sessions per day.  Copyright  VHI. All rights reserved.  (Clinic) Retraction: Row - Bilateral ([place])    No weights, just pull shoulders back and hold 5 seconds. Copyright  VHI. All rights reserved.  IONTOPHORESIS PATIENT PRECAUTIONS & CONTRAINDICATIONS:  . Redness under one or both electrodes can occur.  This characterized by a uniform redness that usually disappears within 12 hours of treatment. . Small pinhead size blisters may result in response to the drug.  Contact your physician if the problem persists more than 24 hours. . On rare occasions, iontophoresis therapy can result in temporary skin reactions such as rash, inflammation, irritation or burns.  The skin reactions may be the result of individual sensitivity to the ionic solution used, the condition of the skin at the start of treatment, reaction to the materials in the electrodes, allergies or sensitivity to dexamethasone, or a poor connection between the patch and your skin.  Discontinue using iontophoresis if you have any of these reactions and report to your therapist. . Remove the Patch or electrodes if you have any undue sensation of pain or burning during the treatment and report discomfort to your therapist. . Tell your Therapist if you have had known adverse reactions to the application of electrical current. . If using the Patch, the LED light will turn off when treatment is complete and the patch can be  removed.  Approximate treatment time is 1-3 hours.  Remove the patch when light goes off or after 6 hours. . The Patch can be worn during normal activity, however excessive motion where the electrodes have been placed can cause poor contact between the skin and the electrode or uneven electrical current resulting in greater risk of skin irritation. [name] Keep out of the reach of children.   . DO NOT use if you have a cardiac pacemaker or any other electrically sensitive implanted device. . DO NOT use if you have a known sensitivity to dexamethasone. . DO NOT use during Magnetic Resonance Imaging (MRI). . DO NOT use over broken or compromised skin (e.g. sunburn, cuts, or acne) due to the increased risk of skin reaction. . DO NOT SHAVE over the area to be treated:  To establish good contact between the Patch and the skin, excessive hair may be clipped. . DO NOT place the Patch or electrodes on or over your eyes, directly over your heart, or brain. . DO NOT reuse the Patch or electrodes as this may cause burns to occur.  [facility] [address] [place], [place] [address] Phone # [phone] Fax [phone]

## 2015-07-01 NOTE — Therapy (Signed)
[facility] [address] [place], [place], [address] Phone: [phone]   Fax:  [phone]  Physical Therapy Evaluation  Patient Details  Name: David [name] MRN: [id number] Date of Birth: 1940/05/29 Referring Provider: [name], [name], MD  Encounter Date: 07/01/2015      David [name] End of Session - 07/01/15 1704    Visit Number 1   Number of Visits 10   Date for David [name] Re-Evaluation 08/26/15   David [name] Start Time [id number]   David [name] Stop Time 1529   David [name] Time Calculation (min) 42 min   Activity Tolerance Patient tolerated treatment well   Behavior During Therapy [facility] for tasks assessed/performed      Past Medical History  Diagnosis Date  . Hyperlipidemia   . Hypertension   . LBP (low back pain) 06/2005    L5 radicular symptoms; MRI of LS spine severe spondylosis at L4-L5 with central cancal stenosis   . History of kidney stones     2007  . Spermatocele   . BPH (benign prostatic hypertrophy)   . Organic impotence   . Nocturia   . Arthritis     gen.  and left hip  . Wears glasses   . Prostate cancer ([place]) 10/2011    (Low grade) [facility] - Dr. Junious Silk  . GERD (gastroesophageal reflux disease)     occasional  . Kidney tumor   . Pyelonephritis 03/05/2013  . Renal oncocytoma of right kidney 01/23/2013    As 3.2 x 3.5 cm enhancing exophytic mass projecting off the lower pole. This is consistent with a renal cell carcinoma. No retroperitoneal lymphadenopathy.   [name] 03/05/2013    Past Surgical History  Procedure Laterality Date  . Colonoscopy  02/15/2006, 2014    Internal hemorrhoids (Dr [name]), last in 2014  . Repair umbilical and ventral hernia's w/ mesh  08-21-2009  . Transurethral resection of prostate  AGE 57  . Tonsillectomy and adenoidectomy  as child  . Inguinal hernia repair Right 1980  . Spermatocelectomy Left 12/18/2012    Procedure: LEFT SPERMATOCELECTOMY;  Surgeon: [name], MD;  Location:  [facility];  Service: Urology;  Laterality: Left;  . Prostate biopsy N/A 12/18/2012    Procedure: BIOPSY TRANSRECTAL ULTRASONIC PROSTATE (TUBP);  Surgeon: [name], MD;  Location: [facility];  Service: Urology;  Laterality: N/A;  . Prostate biopsy  12/05/11    gleason 6, 3/12 cores  . Robotic assited partial nephrectomy Right 02/15/2013    Procedure: ROBOTIC ASSITED PARTIAL NEPHRECTOMY;  Surgeon: [name], MD;  Location: [facility];  Service: Urology;  Laterality: Right;  . Robot assisted laparoscopic radical prostatectomy N/A 02/15/2013    Procedure: ROBOTIC ASSISTED LAPAROSCOPIC RADICAL PROSTATECTOMY;  Surgeon: [name], MD;  Location: [facility];  Service: Urology;  Laterality: N/A;  . Lymphadenectomy Bilateral 02/15/2013    Procedure: LYMPHADENECTOMY WITH INDOCYANINE GREEN DYE INJECTION;  Surgeon: [name], MD;  Location: [facility];  Service: Urology;  Laterality: Bilateral;    There were no vitals filed for this visit.       Subjective Assessment - 07/01/15 1453    Subjective David [name] presents to David [name] with widespread OA and main complaints of bilateral shoulder pain that he notices most with performing sit to stand transition and with getting dressed. David [name] also reports bil. knee pain of a chronic nature due to OA.     Pertinent History Injections into Rt shoulder 09/2014, Lt humerus fracture 1996  Diagnostic tests none recent.    Patient Stated Goals reduce knee pain, increased ease with getting dressed, reduce shoulder pain    Currently in Pain? Yes   Pain Score 2    Pain Location Shoulder   Pain Orientation Right;Left   Pain Descriptors / Indicators Aching   Pain Type Chronic pain   Pain Onset More than a month ago   Pain Frequency Constant   Aggravating Factors  pushing to stand, getting dressed   Pain Relieving Factors getting in a warm shower   Multiple Pain Sites Yes   Pain Score 0  5-6/10 when it occurs   Pain Location Knee    Pain Orientation Left;Right   Pain Descriptors / Indicators Shooting   Pain Type Chronic pain   Pain Onset More than a month ago   Pain Frequency Intermittent   Aggravating Factors  standing, mowing lawn   Pain Relieving Factors sitting to rest, elevation            [facility] David [name] Assessment - 07/01/15 0001    Assessment   Medical Diagnosis generalized OA of multiple sites (M15.9), impingement syndroms, shoulder (M75.40)   Referring Provider [name], [name], MD   Onset Date/Surgical Date 01/31/15   Hand Dominance Right   Next MD Visit August 03, 2015   Prior Therapy none   Precautions   Precautions Other (comment)  prostate cancer   Precaution Comments no [place]- history of cancer   Restrictions   Weight Bearing Restrictions No   Balance Screen   Has the patient fallen in the past 6 months No   Has the patient had a decrease in activity level because of a fear of falling?  No   Is the patient reluctant to leave their home because of a fear of falling?  No   Home Environment   Living Environment Private residence   Type of [place] to enter   Entrance Stairs-Number of Steps 2   [place] One level   Prior Function   Level of [place] Retired   Leisure Haematologist, walking   Cognition   Overall Cognitive Status Within Functional Limits for tasks assessed   Observation/Other Assessments   Focus on Therapeutic Outcomes (FOTO)  55% limitation   Posture/Postural Control   Posture/Postural Control Postural limitations   Postural Limitations Rounded Shoulders;Forward head;Increased thoracic kyphosis   ROM / Strength   AROM / PROM / Strength AROM;Strength   AROM   Overall AROM  Deficits   Overall AROM Comments Knee AROM is WFLs except hamstrings limited by ~10 degrees of extension in sitting   AROM Assessment Site Shoulder   Right/Left Shoulder Right;Left   Right Shoulder Flexion 121 Degrees   Right Shoulder ABduction 85 Degrees    Right Shoulder Internal Rotation --  to L4   Right Shoulder External Rotation --  to C4   Left Shoulder Flexion 110 Degrees   Left Shoulder ABduction 78 Degrees   Left Shoulder Internal Rotation --  to L4   Left Shoulder External Rotation --  to C2   Strength   Overall Strength Deficits   Overall Strength Comments Bil shoulder strength 4/5, bil LE strength 4+/5   Palpation   Patella mobility crepitus on the Rt and reduced mobility bilaterally   Palpation comment marked palpable tenderness over Lt anterior glenohumeral joint at proximal biceps tendon.  Diffuse tenderness over bil glenohumeral joints and bilateral lateral knee joints.   Ambulation/Gait  Ambulation/Gait Yes   Ambulation/Gait Assistance 6: Modified independent (Device/Increase time)   Gait Pattern Within Functional Limits                   [facility] Adult David [name] Treatment/Exercise - 07/01/15 0001    Modalities   Modalities Iontophoresis   Iontophoresis   Type of Iontophoresis Dexamethasone   Location Lt anterior shoulder    Dose 1.0cc   Time 6 hour wear time                David [name] Education - 07/01/15 1521    Education provided Yes   Education Details ionto, scapular squeezes, shoulder flexion, long arc quads and hamstring stretch   Person(s) Educated Patient   Methods Explanation;Demonstration;Handout   Comprehension Verbalized understanding;Returned demonstration          David [name] Short Term Goals - 07/01/15 1447    David [name] SHORT TERM GOAL #1   Title be independent in initial HEP   Time 4   Period Weeks   Status New   David [name] SHORT TERM GOAL #2   Title report a 30% reduction in bil. knee pain with yardwork and standing   Time 4   Period Weeks   Status New   David [name] SHORT TERM GOAL #3   Title report 30% increased ease with getting dressed due to improve shoulder flexibility   Time 4   Period Weeks   Status New   David [name] SHORT TERM GOAL #4   Title report a 30% reduction in shoulder pain with use            David [name] Long Term Goals - 07/01/15 1447    David [name] LONG TERM GOAL #1   Title be independent in advanced HEP   Time 8   Period Weeks   Status New   David [name] LONG TERM GOAL #2   Title reduce FOTO to < or = to 37% limitation   Time 8   Period Weeks   Status New   David [name] LONG TERM GOAL #3   Title report a 60% reduction in bil. knee pain with yardwork and standing   Time 8   Period Weeks   Status New   David [name] LONG TERM GOAL #4   Title report 60% increased ease with getting dressed due to improved flexibility   Time 8   Period Weeks   Status New   David [name] LONG TERM GOAL #5   Title report a 60% reduction in shoulder pain with use with ADLs and self-care   Time 8   Period Weeks   Status New               Plan - 07/01/15 1654    Clinical Impression Statement David [name] presents to David [name] with compliants of widepsread OA with bil shoulder and knee pain.  David [name] demonstrates Lt>Rt shoulder stiffness, hamstring flexiblity deficits bilaterally and palable tenderness in the Lt shoulder.  David [name] with poor seated posture and FOTO score is 55% limitation.  David [name] will benefit from skilled David [name] for shoulder and knee flexibilty and strength progression and pain management as needed.     Rehab Potential Good   David [name] Frequency 2x / week   David [name] Duration 8 weeks   David [name] Treatment/Interventions ADLs/Self Care Home Management;Cryotherapy;Electrical Stimulation;Moist Heat;Therapeutic exercise;Therapeutic activities;Functional mobility training;Stair training;Gait training;Neuromuscular re-education;Manual techniques;Passive range of motion;Dry needling;Iontophoresis 4mg /ml Dexamethasone   David [name] Next Visit Plan ionto #2, shoulder flexibility and strnegth, hamstring flexibility and strength.  Posture education and strength   Consulted  and Agree with Plan of Care Patient      Patient will benefit from skilled therapeutic intervention in order to improve the following deficits and impairments:  Postural dysfunction, Decreased strength, Impaired flexibility,  Pain, Improper body mechanics, Decreased activity tolerance  Visit Diagnosis: Pain in left knee - Plan: David [name] plan of care cert/re-cert  Pain in right knee - Plan: David [name] plan of care cert/re-cert  Pain in left shoulder - Plan: David [name] plan of care cert/re-cert  Pain in right shoulder - Plan: David [name] plan of care cert/re-cert  Stiffness of left shoulder, not elsewhere classified - Plan: David [name] plan of care cert/re-cert  Stiffness of right shoulder, not elsewhere classified - Plan: David [name] plan of care cert/re-cert      G-Codes - [id number]    Functional Assessment Tool Used FOTO: 55% limitation   Functional Limitation Other David [name] primary   Other David [name] Primary Current Status [id number]) At least 40 percent but less than 60 percent impaired, limited or restricted   Other David [name] Primary Goal Status [id number]) At least 20 percent but less than 40 percent impaired, limited or restricted       Problem List Patient Active Problem List   Diagnosis Date Noted  . GERD (gastroesophageal reflux disease) 06/25/2015  . Preventative health care 05/21/2014  . Pulmonary nodule 07/05/2013  . Other malaise and fatigue 06/07/2013  . Blood loss anemia 03/18/2013  . Right retroperitoneal urinoma with ureteral leak 03/05/2013  . Pyelonephritis 03/05/2013  . Renal oncocytoma of right kidney 01/23/2013  . Nocturia 01/11/2013  . Left hip pain 03/05/2012  . Hyperglycemia 06/08/2011  . Prostate cancer ([place]) 05/04/2010  . HERNIA, UMBILICAL [id number]  . ERECTILE DYSFUNCTION, ORGANIC 03/10/2009  . Hyperlipidemia 03/06/2008  . Essential hypertension 03/06/2008     David [name], David [name] 07/01/2015 5:05 PM  [facility] [address] [place], [place], [address] Phone: [phone]   Fax:  [phone]  Name: David [name] MRN: [id number] Date of Birth: 1940/04/02

## 2015-07-07 ENCOUNTER — Ambulatory Visit: Payer: Medicare Other | Admitting: Physical Therapy

## 2015-07-07 DIAGNOSIS — M25561 Pain in right knee: Secondary | ICD-10-CM | POA: Diagnosis not present

## 2015-07-07 DIAGNOSIS — M25562 Pain in left knee: Secondary | ICD-10-CM | POA: Diagnosis not present

## 2015-07-07 DIAGNOSIS — M25512 Pain in left shoulder: Secondary | ICD-10-CM | POA: Diagnosis not present

## 2015-07-07 DIAGNOSIS — M25612 Stiffness of left shoulder, not elsewhere classified: Secondary | ICD-10-CM | POA: Diagnosis not present

## 2015-07-07 DIAGNOSIS — M25511 Pain in right shoulder: Secondary | ICD-10-CM | POA: Diagnosis not present

## 2015-07-07 DIAGNOSIS — M25611 Stiffness of right shoulder, not elsewhere classified: Secondary | ICD-10-CM | POA: Diagnosis not present

## 2015-07-07 NOTE — Therapy (Signed)
[facility] [address], [place], [place], [address] Phone: [phone]   Fax:  [phone]  Physical Therapy Treatment  Patient Details  Name: David [name] MRN: [id number] Date of Birth: 10-15-1940 Referring Provider: [name], [name], MD  Encounter Date: 07/07/2015      PT End of Session - 07/07/15 1439    Visit Number 2   Number of Visits 10   Date for PT Re-Evaluation 08/26/15   PT Start Time 1400   PT Stop Time 1443   PT Time Calculation (min) 43 min   Activity Tolerance Patient tolerated treatment well      Past Medical History  Diagnosis Date  . Hyperlipidemia   . Hypertension   . LBP (low back pain) 06/2005    L5 radicular symptoms; MRI of LS spine severe spondylosis at L4-L5 with central cancal stenosis   . History of kidney stones     2007  . Spermatocele   . BPH (benign prostatic hypertrophy)   . Organic impotence   . Nocturia   . Arthritis     gen.  and left hip  . Wears glasses   . Prostate cancer ([place]) 10/2011    (Low grade) [facility] - Dr. Junious Silk  . GERD (gastroesophageal reflux disease)     occasional  . Kidney tumor   . Pyelonephritis 03/05/2013  . Renal oncocytoma of right kidney 01/23/2013    As 3.2 x 3.5 cm enhancing exophytic mass projecting off the lower pole. This is consistent with a renal cell carcinoma. No retroperitoneal lymphadenopathy.   [name] 03/05/2013    Past Surgical History  Procedure Laterality Date  . Colonoscopy  02/15/2006, 2014    Internal hemorrhoids (Dr [name]), last in 2014  . Repair umbilical and ventral hernia's w/ mesh  08-21-2009  . Transurethral resection of prostate  AGE 75  . Tonsillectomy and adenoidectomy  as child  . Inguinal hernia repair Right 1980  . Spermatocelectomy Left 12/18/2012    Procedure: LEFT SPERMATOCELECTOMY;  Surgeon: [name], MD;  Location: [facility];  Service: Urology;  Laterality:  Left;  . Prostate biopsy N/A 12/18/2012    Procedure: BIOPSY TRANSRECTAL ULTRASONIC PROSTATE (TUBP);  Surgeon: [name], MD;  Location: [facility];  Service: Urology;  Laterality: N/A;  . Prostate biopsy  12/05/11    gleason 6, 3/12 cores  . Robotic assited partial nephrectomy Right 02/15/2013    Procedure: ROBOTIC ASSITED PARTIAL NEPHRECTOMY;  Surgeon: [name], MD;  Location: [facility];  Service: Urology;  Laterality: Right;  . Robot assisted laparoscopic radical prostatectomy N/A 02/15/2013    Procedure: ROBOTIC ASSISTED LAPAROSCOPIC RADICAL PROSTATECTOMY;  Surgeon: [name], MD;  Location: [facility];  Service: Urology;  Laterality: N/A;  . Lymphadenectomy Bilateral 02/15/2013    Procedure: LYMPHADENECTOMY WITH INDOCYANINE GREEN DYE INJECTION;  Surgeon: [name], MD;  Location: [facility];  Service: Urology;  Laterality: Bilateral;    There were no vitals filed for this visit.      Subjective Assessment - 07/07/15 1408    Subjective Hurts with IR behind back, left shoulder 4/10.     Currently in Pain? Yes   Pain Score 4    Pain Location Shoulder   Pain Orientation Left   Aggravating Factors  behind the back, taking off jacket   Pain Score 2   Pain Location Knee   Pain Orientation Right  [place] Adult PT Treatment/Exercise - 07/07/15 0001    Knee/Hip Exercises: Stretches   Active Hamstring Stretch Right;Left;2 reps;30 seconds   Other Knee/Hip Stretches doorway psoas stretch with UE up doorframe and across 5x r/l   Knee/Hip Exercises: Aerobic   Nustep L1 10 min seat 12, arms 12   Knee/Hip Exercises: Seated   Long Arc Quad Strengthening;Right;Left;15 reps   Long Arc Quad Limitations red band   Knee/Hip Flexion red band 15x R/L with ankle DF   Sit to [employer] 5 reps  from high table, no UEs   Shoulder Exercises: Pulleys   Flexion 3 minutes   Other Pulley Exercises IR on pulleys R/L 2 min each    Iontophoresis   Type of Iontophoresis Dexamethasone   Location Lt anterior shoulder    Dose  1.0 cc #2   Time 6 hour wear time                  PT Short Term Goals - 07/07/15 1542    PT SHORT TERM GOAL #1   Title be independent in initial HEP   Time 4   Period Weeks   Status On-going   PT SHORT TERM GOAL #2   Title report a 30% reduction in bil. knee pain with yardwork and standing   Time 4   Period Weeks   Status On-going   PT SHORT TERM GOAL #3   Title report 30% increased ease with getting dressed due to improve shoulder flexibility   Time 4   Period Weeks   Status On-going   PT SHORT TERM GOAL #4   Title report a 30% reduction in shoulder pain with use   Time 4   Period Weeks   Status On-going           PT Long Term Goals - 07/07/15 1543    PT LONG TERM GOAL #1   Title be independent in advanced HEP   Time 8   Period Weeks   Status On-going   PT LONG TERM GOAL #2   Title reduce FOTO to < or = to 37% limitation   Time 8   Period Weeks   Status On-going   PT LONG TERM GOAL #3   Title report a 60% reduction in bil. knee pain with yardwork and standing   Time 8   Period Weeks   Status On-going   PT LONG TERM GOAL #4   Title report 60% increased ease with getting dressed due to improved flexibility   Time 8   Period Weeks   Status On-going   PT LONG TERM GOAL #5   Title report a 60% reduction in shoulder pain with use with ADLs and self-care   Time 8   Period Weeks   Status On-going               Plan - 07/07/15 1535    Clinical Impression Statement The patient reports his main complaint is left shoulder pain especially with IR and right knee pain with weight bearing.  The patient was closely monitored and exercises modified to keep pain level <5/10.  Discussed importance of strengthening muscles around an arthritic joint and postural considerations and effect on shoulder ROM.     PT Next Visit Plan continue ionto trial #3; continue  Nu-Step;  LE flexibility, quad and HS strengthening;  shoulder ROM;  glenohumeral and scapular muscle strengthening as pain allows; shoulder manual therapy      Patient will benefit from  skilled therapeutic intervention in order to improve the following deficits and impairments:     Visit Diagnosis: Pain in left knee  Pain in right knee  Pain in left shoulder  Pain in right shoulder  Stiffness of left shoulder, not elsewhere classified  Stiffness of right shoulder, not elsewhere classified     Problem List Patient Active Problem List   Diagnosis Date Noted  . GERD (gastroesophageal reflux disease) 06/25/2015  . Preventative health care 05/21/2014  . Pulmonary nodule 07/05/2013  . Other malaise and fatigue 06/07/2013  . Blood loss anemia 03/18/2013  . Right retroperitoneal urinoma with ureteral leak 03/05/2013  . Pyelonephritis 03/05/2013  . Renal oncocytoma of right kidney 01/23/2013  . Nocturia 01/11/2013  . Left hip pain 03/05/2012  . Hyperglycemia 06/08/2011  . Prostate cancer ([place]) 05/04/2010  . HERNIA, UMBILICAL [id number]  . ERECTILE DYSFUNCTION, ORGANIC 03/10/2009  . Hyperlipidemia 03/06/2008  . Essential hypertension 03/06/2008    Ruben Im, PT 07/07/2015 3:46 PM Phone: [phone] Fax: [phone] Alvera Singh 07/07/2015, 3:45 PM  [facility] [address], [place], [place], [address] Phone: [phone]   Fax:  [phone]  Name: LINLEY PENARANDA MRN: [id number] Date of Birth: [birth year]

## 2015-07-14 ENCOUNTER — Ambulatory Visit: Payer: Medicare Other | Admitting: Physical Therapy

## 2015-07-14 DIAGNOSIS — M25511 Pain in right shoulder: Secondary | ICD-10-CM | POA: Diagnosis not present

## 2015-07-14 DIAGNOSIS — M25612 Stiffness of left shoulder, not elsewhere classified: Secondary | ICD-10-CM | POA: Diagnosis not present

## 2015-07-14 DIAGNOSIS — M25512 Pain in left shoulder: Secondary | ICD-10-CM

## 2015-07-14 DIAGNOSIS — M25561 Pain in right knee: Secondary | ICD-10-CM

## 2015-07-14 DIAGNOSIS — M25562 Pain in left knee: Secondary | ICD-10-CM

## 2015-07-14 DIAGNOSIS — M25611 Stiffness of right shoulder, not elsewhere classified: Secondary | ICD-10-CM | POA: Diagnosis not present

## 2015-07-14 NOTE — Therapy (Signed)
[facility] [address] [place], [place], [address] Phone: [phone]   Fax:  [phone]  Physical Therapy Treatment  Patient Details  Name: David [name] MRN: [id number] Date of Birth: Jun 24, 1940 Referring Provider: [name], [name], MD  Encounter Date: 07/14/2015      PT End of Session - 07/14/15 1654    Visit Number 3   Number of Visits 10   Date for PT Re-Evaluation 08/26/15   PT Start Time [id number]   PT Stop Time 1443   PT Time Calculation (min) 48 min   Activity Tolerance Patient tolerated treatment well      Past Medical History  Diagnosis Date  . Hyperlipidemia   . Hypertension   . LBP (low back pain) 06/2005    L5 radicular symptoms; MRI of LS spine severe spondylosis at L4-L5 with central cancal stenosis   . History of kidney stones     2007  . Spermatocele   . BPH (benign prostatic hypertrophy)   . Organic impotence   . Nocturia   . Arthritis     gen.  and left hip  . Wears glasses   . Prostate cancer ([place]) 10/2011    (Low grade) [facility] - Dr. Junious Silk  . GERD (gastroesophageal reflux disease)     occasional  . Kidney tumor   . Pyelonephritis 03/05/2013  . Renal oncocytoma of right kidney 01/23/2013    As 3.2 x 3.5 cm enhancing exophytic mass projecting off the lower pole. This is consistent with a renal cell carcinoma. No retroperitoneal lymphadenopathy.   [name] 03/05/2013    Past Surgical History  Procedure Laterality Date  . Colonoscopy  02/15/2006, 2014    Internal hemorrhoids (Dr [name]), last in 2014  . Repair umbilical and ventral hernia's w/ mesh  08-21-2009  . Transurethral resection of prostate  AGE 75  . Tonsillectomy and adenoidectomy  as child  . Inguinal hernia repair Right 1980  . Spermatocelectomy Left 12/18/2012    Procedure: LEFT SPERMATOCELECTOMY;  Surgeon: [name], MD;  Location: [facility];  Service: Urology;  Laterality:  Left;  . Prostate biopsy N/A 12/18/2012    Procedure: BIOPSY TRANSRECTAL ULTRASONIC PROSTATE (TUBP);  Surgeon: [name], MD;  Location: [facility];  Service: Urology;  Laterality: N/A;  . Prostate biopsy  12/05/11    gleason 6, 3/12 cores  . Robotic assited partial nephrectomy Right 02/15/2013    Procedure: ROBOTIC ASSITED PARTIAL NEPHRECTOMY;  Surgeon: [name], MD;  Location: [facility];  Service: Urology;  Laterality: Right;  . Robot assisted laparoscopic radical prostatectomy N/A 02/15/2013    Procedure: ROBOTIC ASSISTED LAPAROSCOPIC RADICAL PROSTATECTOMY;  Surgeon: [name], MD;  Location: [facility];  Service: Urology;  Laterality: N/A;  . Lymphadenectomy Bilateral 02/15/2013    Procedure: LYMPHADENECTOMY WITH INDOCYANINE GREEN DYE INJECTION;  Surgeon: [name], MD;  Location: [facility];  Service: Urology;  Laterality: Bilateral;    There were no vitals filed for this visit.      Subjective Assessment - 07/14/15 1355    Subjective "I'm sore as a boil" both shoulders and right knee.  Popping in left shoullder with overhead motion.  The best thing I do is get in the hot shower.  No change with last ionto patch.     Currently in Pain? Yes   Pain Score 5    Pain Location Shoulder   Pain Orientation Left   Pain Score 5  Pain Location Knee   Pain Orientation Right                         [facility] Adult PT Treatment/Exercise - 07/14/15 0001    Knee/Hip Exercises: Stretches   Active Hamstring Stretch Right;Left;2 reps;30 seconds  on step   Other Knee/Hip Stretches on step psoas stretch with UE up doorframe and across 5x r/l   Knee/Hip Exercises: Aerobic   Nustep L1 10 min seat 12, arms 12   Knee/Hip Exercises: Standing   Lateral Step Up Right;Left;1 set;10 reps;Hand Hold: 1   Knee/Hip Exercises: Seated   Sit to Sand 5 reps;2 sets  from high table, no UEs   Shoulder Exercises: Standing   External Rotation  Strengthening;Right;Left;15 reps;Theraband   Theraband Level (Shoulder External Rotation) Level 2 (Red)   Internal Rotation Strengthening;Right;Left;15 reps;Theraband   Theraband Level (Shoulder Internal Rotation) Level 2 (Red)   Flexion Strengthening;Right;Left;15 reps;Theraband   Theraband Level (Shoulder Flexion) Level 2 (Red)   Extension Strengthening;Left;Right;15 reps;Theraband   Theraband Level (Shoulder Extension) Level 2 (Red)   Row Strengthening;Right;Left;15 reps;Theraband   Theraband Level (Shoulder Row) Level 2 (Red)   Iontophoresis   Type of Iontophoresis Dexamethasone   Location Lt anterior shoulder    Dose 1.0 cc #3   Time 6 hour wear time                PT Education - 07/14/15 1431    Education provided Yes   Education Details modified [place]   Person(s) Educated Patient   Methods Explanation;Demonstration;Handout   Comprehension Verbalized understanding;Returned demonstration          PT Short Term Goals - 07/14/15 2309    PT SHORT TERM GOAL #1   Title be independent in initial HEP   Time 4   Period Weeks   Status On-going   PT SHORT TERM GOAL #2   Title report a 30% reduction in bil. knee pain with yardwork and standing   Time 4   Period Weeks   Status On-going   PT SHORT TERM GOAL #3   Title report 30% increased ease with getting dressed due to improve shoulder flexibility   Time 4   Period Weeks   Status On-going   PT SHORT TERM GOAL #4   Title report a 30% reduction in shoulder pain with use   Time 4   Period Weeks   Status On-going           PT Long Term Goals - 07/14/15 2310    PT LONG TERM GOAL #1   Title be independent in advanced HEP   Time 8   Period Weeks   Status On-going   PT LONG TERM GOAL #2   Title reduce FOTO to < or = to 37% limitation   Time 8   Period Weeks   Status On-going   PT LONG TERM GOAL #3   Title report a 60% reduction in bil. knee pain with yardwork and standing   Time 8   Period Weeks    Status On-going   PT LONG TERM GOAL #4   Title report 60% increased ease with getting dressed due to improved flexibility   Time 8   Period Weeks   Status On-going   PT LONG TERM GOAL #5   Title report a 60% reduction in shoulder pain with use with ADLs and self-care   Time 8   Period Weeks   Status On-going  Plan - 07/14/15 2305    Clinical Impression Statement The patient is able to participate in low level LE and UE strengthening without exacerbation of pain.  Verbal and tactile cues for scapular retraction/depression.  If no improvement in shoulder pain next visit will discontinue ionto.  Kinesiotaping as needed.  Therapist closely monitoring pain and modifying as needed.     PT Next Visit Plan Nu-Step;  ionto?;  scapular exercises;   leg press;  step ups      Patient will benefit from skilled therapeutic intervention in order to improve the following deficits and impairments:     Visit Diagnosis: Pain in left knee  Pain in right knee  Pain in left shoulder  Pain in right shoulder  Stiffness of left shoulder, not elsewhere classified  Stiffness of right shoulder, not elsewhere classified     Problem List Patient Active Problem List   Diagnosis Date Noted  . GERD (gastroesophageal reflux disease) 06/25/2015  . Preventative health care 05/21/2014  . Pulmonary nodule 07/05/2013  . Other malaise and fatigue 06/07/2013  . Blood loss anemia 03/18/2013  . Right retroperitoneal urinoma with ureteral leak 03/05/2013  . Pyelonephritis 03/05/2013  . Renal oncocytoma of right kidney 01/23/2013  . Nocturia 01/11/2013  . Left hip pain 03/05/2012  . Hyperglycemia 06/08/2011  . Prostate cancer ([place]) 05/04/2010  . HERNIA, UMBILICAL [id number]  . ERECTILE DYSFUNCTION, ORGANIC 03/10/2009  . Hyperlipidemia 03/06/2008  . Essential hypertension 03/06/2008     Ruben Im, PT 07/14/2015 11:11 PM Phone: [phone] Fax: [phone]  Alvera Singh 07/14/2015, 11:11 PM  [facility] [address] [place], [place], [address] Phone: [phone]   Fax:  [phone]  Name: BRAYVEN SEIDMAN MRN: [id number] Date of Birth: 04/13/1940

## 2015-07-14 NOTE — Patient Instructions (Signed)
Strengthening: Resisted Internal Rotation   Hold tubing in left hand, elbow at side and forearm out. Rotate forearm in across body. Repeat _10___ times per set. Do __1__ sets per session. Do _1___ sessions per day.  http://orth.exer.us/830   Copyright  VHI. All rights reserved.  Strengthening: Resisted External Rotation   Hold tubing in right hand, elbow at side and forearm across body. Rotate forearm out. Repeat __10__ times per set. Do ___1_ sets per session. Do _1___ sessions per day.  http://orth.exer.us/828   Copyright  VHI. All rights reserved.  Strengthening: Resisted Flexion   Hold tubing with left arm at side. Pull forward and up. Move shoulder through pain-free range of motion. Repeat __10__ times per set. Do __1__ sets per session. Do __1__ sessions per day.  http://orth.exer.us/824   Copyright  VHI. All rights reserved.  Strengthening: Resisted Extension   Hold tubing in right hand, arm forward. Pull arm back, elbow straight. Repeat __10__ times per set. Do ___1_ sets per session. Do __1__ sessions per day. 1 http://orth.exer.us/832   Copyright  VHI. All rights reserved.     Ruben Im PT [facility] [address] [place], [place] [address] Phone # [phone] Fax [phone]

## 2015-07-16 ENCOUNTER — Ambulatory Visit: Payer: Medicare Other | Admitting: Physical Therapy

## 2015-07-16 DIAGNOSIS — M25562 Pain in left knee: Secondary | ICD-10-CM

## 2015-07-16 DIAGNOSIS — M25561 Pain in right knee: Secondary | ICD-10-CM | POA: Diagnosis not present

## 2015-07-16 DIAGNOSIS — M25511 Pain in right shoulder: Secondary | ICD-10-CM | POA: Diagnosis not present

## 2015-07-16 DIAGNOSIS — M25611 Stiffness of right shoulder, not elsewhere classified: Secondary | ICD-10-CM | POA: Diagnosis not present

## 2015-07-16 DIAGNOSIS — M25612 Stiffness of left shoulder, not elsewhere classified: Secondary | ICD-10-CM

## 2015-07-16 DIAGNOSIS — M25512 Pain in left shoulder: Secondary | ICD-10-CM | POA: Diagnosis not present

## 2015-07-16 NOTE — Therapy (Signed)
[facility] [address], [place], [place], [address] Phone: [phone]   Fax:  [phone]  Physical Therapy Treatment  Patient Details  Name: David [name] MRN: [id number] Date of Birth: 05/19/1940 Referring Provider: [name], [name], MD  Encounter Date: 07/16/2015      PT End of Session - 07/16/15 1527    Visit Number 4   Number of Visits 10   Date for PT Re-Evaluation 08/26/15   PT Start Time [id number]   PT Stop Time 1443   PT Time Calculation (min) 47 min   Activity Tolerance Patient tolerated treatment well      Past Medical History  Diagnosis Date  . Hyperlipidemia   . Hypertension   . LBP (low back pain) 06/2005    L5 radicular symptoms; MRI of LS spine severe spondylosis at L4-L5 with central cancal stenosis   . History of kidney stones     2007  . Spermatocele   . BPH (benign prostatic hypertrophy)   . Organic impotence   . Nocturia   . Arthritis     gen.  and left hip  . Wears glasses   . Prostate cancer ([place]) 10/2011    (Low grade) [facility] - Dr. Junious Silk  . GERD (gastroesophageal reflux disease)     occasional  . Kidney tumor   . Pyelonephritis 03/05/2013  . Renal oncocytoma of right kidney 01/23/2013    As 3.2 x 3.5 cm enhancing exophytic mass projecting off the lower pole. This is consistent with a renal cell carcinoma. No retroperitoneal lymphadenopathy.   [name] 03/05/2013    Past Surgical History  Procedure Laterality Date  . Colonoscopy  02/15/2006, 2014    Internal hemorrhoids (Dr [name]), last in 2014  . Repair umbilical and ventral hernia's w/ mesh  08-21-2009  . Transurethral resection of prostate  AGE 54  . Tonsillectomy and adenoidectomy  as child  . Inguinal hernia repair Right 1980  . Spermatocelectomy Left 12/18/2012    Procedure: LEFT SPERMATOCELECTOMY;  Surgeon: [name], MD;  Location: [facility];  Service: Urology;  Laterality:  Left;  . Prostate biopsy N/A 12/18/2012    Procedure: BIOPSY TRANSRECTAL ULTRASONIC PROSTATE (TUBP);  Surgeon: [name], MD;  Location: [facility];  Service: Urology;  Laterality: N/A;  . Prostate biopsy  12/05/11    gleason 6, 3/12 cores  . Robotic assited partial nephrectomy Right 02/15/2013    Procedure: ROBOTIC ASSITED PARTIAL NEPHRECTOMY;  Surgeon: [name], MD;  [facility];  Service: Urology;  Laterality: Right;  . Robot assisted laparoscopic radical prostatectomy N/A 02/15/2013    Procedure: ROBOTIC ASSISTED LAPAROSCOPIC RADICAL PROSTATECTOMY;  Surgeon: [name], MD;  Location: [facility];  Service: Urology;  Laterality: N/A;  . Lymphadenectomy Bilateral 02/15/2013    Procedure: LYMPHADENECTOMY WITH INDOCYANINE GREEN DYE INJECTION;  Surgeon: [name], MD;  Location: [facility];  Service: Urology;  Laterality: Bilateral;    There were no vitals filed for this visit.      Subjective Assessment - 07/16/15 1358    Subjective "I'm limbering up some."  My left shoulder still hurts/sore.     Currently in Pain? Yes   Pain Score 4    Pain Location Shoulder   Pain Orientation Left   Pain Type Chronic pain   Pain Frequency Constant   Pain Score 0   Pain Location Knee   Pain Orientation Right   Pain Type Chronic pain  Pain Onset More than a month ago                         [facility] Adult PT Treatment/Exercise - 07/16/15 0001    Knee/Hip Exercises: Stretches   Active Hamstring Stretch Right;Left;2 reps;30 seconds  on step   Other Knee/Hip Stretches on step psoas stretch with UE up doorframe and across 5x r/l   Knee/Hip Exercises: Aerobic   Nustep L1 10 min seat 12, arms 12   Knee/Hip Exercises: Standing   Lateral Step Up Right;Left;1 set;10 reps;Hand Hold: 1  with opposite hip abduction R/L   Forward Step Up Right;Left;1 set;10 reps;Step Height: 6";Hand Hold: 2   Knee/Hip Exercises: Seated   Sit to Sand 5 reps;2 sets   from high table, no UEs; slightly lower than last visit.    Shoulder Exercises: Prone   Other Prone Exercises kneeling shoulder extension, HABD, scaption at 120 degrees 2# 10x each bilaterally   Shoulder Exercises: Standing   External Rotation Strengthening;Right;Left;15 reps;Theraband   Theraband Level (Shoulder External Rotation) Level 2 (Red)   Internal Rotation Strengthening;Right;Left;15 reps;Theraband   Theraband Level (Shoulder Internal Rotation) Level 2 (Red)   Flexion Strengthening;Right;Left;15 reps;Theraband   Theraband Level (Shoulder Flexion) Level 2 (Red)   Extension Strengthening;Left;Right;15 reps;Theraband   Theraband Level (Shoulder Extension) Level 2 (Red)   Row Strengthening;Right;Left;15 reps;Theraband   Theraband Level (Shoulder Row) Level 2 (Red)                PT Education - 07/16/15 1526    Education provided Yes   Education Details kneeling Is,Ts, Ys;  sidelying ER;  clams with red band   Person(s) Educated Patient   Methods Explanation;Demonstration;Handout   Comprehension Verbalized understanding;Returned demonstration          PT Short Term Goals - 07/16/15 1530    PT SHORT TERM GOAL #1   Title be independent in initial HEP   Time 4   Period Weeks   Status Achieved   PT SHORT TERM GOAL #2   Title report a 30% reduction in bil. knee pain with yardwork and standing   Time 4   Period Weeks   Status On-going   PT SHORT TERM GOAL #3   Title report 30% increased ease with getting dressed due to improve shoulder flexibility   Time 4   Period Weeks   Status On-going   PT SHORT TERM GOAL #4   Title report a 30% reduction in shoulder pain with use   Time 4   Period Weeks   Status On-going           PT Long Term Goals - 07/16/15 1547    PT LONG TERM GOAL #1   Title be independent in advanced HEP   Time 8   Period Weeks   Status On-going   PT LONG TERM GOAL #2   Title reduce FOTO to < or = to 37% limitation   Time 8   Period  Weeks   Status On-going   PT LONG TERM GOAL #3   Title report a 60% reduction in bil. knee pain with yardwork and standing   Time 8   Period Weeks   Status On-going   PT LONG TERM GOAL #4   Title report 60% increased ease with getting dressed due to improved flexibility   Time 8   Period Weeks   Status On-going   PT LONG TERM GOAL #5   Title  report a 60% reduction in shoulder pain with use with ADLs and self-care   Time 8   Period Weeks   Status On-going               Plan - 07/16/15 1527    Clinical Impression Statement The patient reports he is feeling looser since started theraband exercises.  His shoulder pain is mild, no knee pain.  By the end of treatment session, patient denies any pain.  Good carryover with previous HEP.  No change with ionto patch so will discontinue.  Therapist closely monitoring for pain and proper technique.     PT Next Visit Plan Nu-Step;   scapular exercises;   leg press;  step ups; check progress toward STGs      Patient will benefit from skilled therapeutic intervention in order to improve the following deficits and impairments:     Visit Diagnosis: Pain in left knee  Pain in right knee  Pain in left shoulder  Pain in right shoulder  Stiffness of left shoulder, not elsewhere classified  Stiffness of right shoulder, not elsewhere classified     Problem List Patient Active Problem List   Diagnosis Date Noted  . GERD (gastroesophageal reflux disease) 06/25/2015  . Preventative health care 05/21/2014  . Pulmonary nodule 07/05/2013  . Other malaise and fatigue 06/07/2013  . Blood loss anemia 03/18/2013  . Right retroperitoneal urinoma with ureteral leak 03/05/2013  . Pyelonephritis 03/05/2013  . Renal oncocytoma of right kidney 01/23/2013  . Nocturia 01/11/2013  . Left hip pain 03/05/2012  . Hyperglycemia 06/08/2011  . Prostate cancer ([place]) 05/04/2010  . HERNIA, UMBILICAL [id number]  . ERECTILE DYSFUNCTION, ORGANIC  03/10/2009  . Hyperlipidemia 03/06/2008  . Essential hypertension 03/06/2008   Ruben Im, PT 07/16/2015 3:49 PM Phone: [phone] Fax: [phone]   Alvera Singh 07/16/2015, 3:49 PM  [facility] [address], [place], [place], [address] Phone: [phone]   Fax:  [phone]  Name: OVAL LITTREL MRN: [id number] Date of Birth: 01-30-1941

## 2015-07-16 NOTE — Patient Instructions (Addendum)
    Abduction: Clam (Eccentric) - Side-Lying   Lie on side with knees bent. Lift top knee, keeping feet together. Keep trunk steady. Slowly lower for 3-5 seconds. 10___ reps per set, _1_ sets per day, 7___ days per week.   Ruben Im PT  [facility] [address] [place], [place] [address] Phone # [phone] Fax [phone]  Copyright  VHI. All rights reserved.

## 2015-07-21 ENCOUNTER — Encounter: Payer: Self-pay | Admitting: Physical Therapy

## 2015-07-21 ENCOUNTER — Ambulatory Visit: Payer: Medicare Other | Admitting: Physical Therapy

## 2015-07-21 DIAGNOSIS — M25512 Pain in left shoulder: Secondary | ICD-10-CM | POA: Diagnosis not present

## 2015-07-21 DIAGNOSIS — M25612 Stiffness of left shoulder, not elsewhere classified: Secondary | ICD-10-CM | POA: Diagnosis not present

## 2015-07-21 DIAGNOSIS — M25562 Pain in left knee: Secondary | ICD-10-CM | POA: Diagnosis not present

## 2015-07-21 DIAGNOSIS — M25611 Stiffness of right shoulder, not elsewhere classified: Secondary | ICD-10-CM | POA: Diagnosis not present

## 2015-07-21 DIAGNOSIS — M25511 Pain in right shoulder: Secondary | ICD-10-CM | POA: Diagnosis not present

## 2015-07-21 DIAGNOSIS — M25561 Pain in right knee: Secondary | ICD-10-CM

## 2015-07-21 NOTE — Therapy (Signed)
[facility] [address], [place], [place], [address] Phone: [phone]   Fax:  [phone]  Physical Therapy Treatment  Patient Details  Name: David [name] MRN: [id number] Date of Birth: June 27, 1940 Referring Provider: [name], [name], MD  Encounter Date: 07/21/2015      PT End of Session - 07/21/15 1414    Visit Number 5   Number of Visits 10   Date for PT Re-Evaluation 08/26/15   PT Start Time [id number]   PT Stop Time 1444   PT Time Calculation (min) 45 min   Activity Tolerance Patient tolerated treatment well   Behavior During Therapy [facility] for tasks assessed/performed      Past Medical History  Diagnosis Date  . Hyperlipidemia   . Hypertension   . LBP (low back pain) 06/2005    L5 radicular symptoms; MRI of LS spine severe spondylosis at L4-L5 with central cancal stenosis   . History of kidney stones     2007  . Spermatocele   . BPH (benign prostatic hypertrophy)   . Organic impotence   . Nocturia   . Arthritis     gen.  and left hip  . Wears glasses   . Prostate cancer ([place]) 10/2011    (Low grade) [facility] - Dr. Junious Silk  . GERD (gastroesophageal reflux disease)     occasional  . Kidney tumor   . Pyelonephritis 03/05/2013  . Renal oncocytoma of right kidney 01/23/2013    As 3.2 x 3.5 cm enhancing exophytic mass projecting off the lower pole. This is consistent with a renal cell carcinoma. No retroperitoneal lymphadenopathy.   [name] 03/05/2013    Past Surgical History  Procedure Laterality Date  . Colonoscopy  02/15/2006, 2014    Internal hemorrhoids (Dr [name]), last in 2014  . Repair umbilical and ventral hernia's w/ mesh  08-21-2009  . Transurethral resection of prostate  AGE 46  . Tonsillectomy and adenoidectomy  as child  . Inguinal hernia repair Right 1980  . Spermatocelectomy Left 12/18/2012    Procedure: LEFT SPERMATOCELECTOMY;  Surgeon: [name], MD;  Location:  [facility];  Service: Urology;  Laterality: Left;  . Prostate biopsy N/A 12/18/2012    Procedure: BIOPSY TRANSRECTAL ULTRASONIC PROSTATE (TUBP);  Surgeon: [name], MD;  Location: [facility];  Service: Urology;  Laterality: N/A;  . Prostate biopsy  12/05/11    gleason 6, 3/12 cores  . Robotic assited partial nephrectomy Right 02/15/2013    Procedure: ROBOTIC ASSITED PARTIAL NEPHRECTOMY;  Surgeon: [name], MD;  Location: [facility];  Service: Urology;  Laterality: Right;  . Robot assisted laparoscopic radical prostatectomy N/A 02/15/2013    Procedure: ROBOTIC ASSISTED LAPAROSCOPIC RADICAL PROSTATECTOMY;  Surgeon: [name], MD;  Location: [facility];  Service: Urology;  Laterality: N/A;  . Lymphadenectomy Bilateral 02/15/2013    Procedure: LYMPHADENECTOMY WITH INDOCYANINE GREEN DYE INJECTION;  Surgeon: [name], MD;  Location: [facility];  Service: Urology;  Laterality: Bilateral;    There were no vitals filed for this visit.      Subjective Assessment - 07/21/15 1408    Subjective I feel better, but transition form sit to stand is still challenging due to weakness bil legs.    Pertinent History Injections into Rt shoulder 09/2014, Lt humerus fracture 1996   Diagnostic tests none recent.    Patient Stated Goals reduce knee pain, increased ease with getting dressed, reduce shoulder pain    Currently  in Pain? Yes   Pain Score 3    Pain Location Shoulder   Pain Orientation Left   Pain Descriptors / Indicators Aching   Pain Type Chronic pain   Pain Onset More than a month ago   Pain Frequency Constant   Aggravating Factors  behind the back, taking of jacket   Pain Relieving Factors getting in a warm shower   Multiple Pain Sites Yes                         [facility] Adult PT Treatment/Exercise - 07/21/15 0001    Knee/Hip Exercises: Stretches   Active Hamstring Stretch Right;Left;2 reps;30 seconds  on step   Knee/Hip  Exercises: Aerobic   Nustep L1 10 min seat 12, arms 12   Knee/Hip Exercises: Standing   Lateral Step Up Right;Left;1 set;10 reps;Hand Hold: 1  Right leg is more challenging   Forward Step Up Right;Left;1 set;10 reps;Step Height: 6";Hand Hold: 2   Knee/Hip Exercises: Seated   Sit to Sand 5 reps  x 4 from 23", 22" 19" and 17"   Shoulder Exercises: Prone   Other Prone Exercises shoulder extension, flexion, scaption at 120 degrees 2# 10x each bilaterally   Shoulder Exercises: Standing   External Rotation Strengthening;Right;Left;15 reps;Theraband   Theraband Level (Shoulder External Rotation) Level 2 (Red)   Internal Rotation Strengthening;Right;Left;15 reps;Theraband   Theraband Level (Shoulder Internal Rotation) Level 2 (Red)   Flexion Strengthening;Right;Left;15 reps;Theraband   Theraband Level (Shoulder Flexion) Level 2 (Red)   Extension Strengthening;Left;Right;15 reps;Theraband   Theraband Level (Shoulder Extension) Level 2 (Red)   Row Strengthening;Right;Left;15 reps;Theraband   Theraband Level (Shoulder Row) Level 2 (Red)                  PT Short Term Goals - 07/21/15 1426    PT SHORT TERM GOAL #1   Title be independent in initial HEP   Time 4   Period Weeks   Status Achieved   PT SHORT TERM GOAL #2   Title report a 30% reduction in bil. knee pain with yardwork and standing   Time 4   Period Weeks   Status On-going   PT SHORT TERM GOAL #3   Title report 30% increased ease with getting dressed due to improve shoulder flexibility   Time 4   Period Weeks   Status On-going   PT SHORT TERM GOAL #4   Title report a 30% reduction in shoulder pain with use   Time 4   Period Weeks   Status On-going           PT Long Term Goals - 07/16/15 1547    PT LONG TERM GOAL #1   Title be independent in advanced HEP   Time 8   Period Weeks   Status On-going   PT LONG TERM GOAL #2   Title reduce FOTO to < or = to 37% limitation   Time 8   Period Weeks   Status  On-going   PT LONG TERM GOAL #3   Title report a 60% reduction in bil. knee pain with yardwork and standing   Time 8   Period Weeks   Status On-going   PT LONG TERM GOAL #4   Title report 60% increased ease with getting dressed due to improved flexibility   Time 8   Period Weeks   Status On-going   PT LONG TERM GOAL #5   Title report a 60% reduction  in shoulder pain with use with ADLs and self-care   Time 8   Period Weeks   Status On-going               Plan - 07/21/15 1417    Clinical Impression Statement Pt reports he feels looser since start of PT. His Right shoulder is fine, but Left shoulder is sore. Pt will continue to benefit from skilled PT for LE/UE  strengthening and pain control.   Rehab Potential Good   PT Frequency 2x / week   PT Duration 8 weeks   PT Treatment/Interventions ADLs/Self Care Home Management;Cryotherapy;Electrical Stimulation;Moist Heat;Therapeutic exercise;Therapeutic activities;Functional mobility training;Stair training;Gait training;Neuromuscular re-education;Manual techniques;Passive range of motion;Dry needling;Iontophoresis 4mg /ml Dexamethasone   PT Next Visit Plan Nu-Step;   scapular exercises;   leg press;  step ups; check progress toward STGs   Consulted and Agree with Plan of Care Patient      Patient will benefit from skilled therapeutic intervention in order to improve the following deficits and impairments:  Postural dysfunction, Decreased strength, Impaired flexibility, Pain, Improper body mechanics, Decreased activity tolerance  Visit Diagnosis: Pain in left knee  Pain in right knee  Pain in left shoulder  Pain in right shoulder  Stiffness of left shoulder, not elsewhere classified  Stiffness of right shoulder, not elsewhere classified     Problem List Patient Active Problem List   Diagnosis Date Noted  . GERD (gastroesophageal reflux disease) 06/25/2015  . Preventative health care 05/21/2014  . Pulmonary nodule  07/05/2013  . Other malaise and fatigue 06/07/2013  . Blood loss anemia 03/18/2013  . Right retroperitoneal urinoma with ureteral leak 03/05/2013  . Pyelonephritis 03/05/2013  . Renal oncocytoma of right kidney 01/23/2013  . Nocturia 01/11/2013  . Left hip pain 03/05/2012  . Hyperglycemia 06/08/2011  . Prostate cancer ([place]) 05/04/2010  . HERNIA, UMBILICAL [id number]  . ERECTILE DYSFUNCTION, ORGANIC 03/10/2009  . Hyperlipidemia 03/06/2008  . Essential hypertension 03/06/2008    NAUMANN-HOUEGNIFIO,Natali Lavallee PTA 07/21/2015, 2:46 PM  [facility] [address], [place], [place], [address] Phone: [phone]   Fax:  [phone]  Name: David [name] MRN: [id number] Date of Birth: 28-Nov-1940

## 2015-07-23 ENCOUNTER — Encounter: Payer: Self-pay | Admitting: Physical Therapy

## 2015-07-23 ENCOUNTER — Ambulatory Visit: Payer: Medicare Other | Admitting: Physical Therapy

## 2015-07-23 DIAGNOSIS — M25611 Stiffness of right shoulder, not elsewhere classified: Secondary | ICD-10-CM

## 2015-07-23 DIAGNOSIS — M25562 Pain in left knee: Secondary | ICD-10-CM | POA: Diagnosis not present

## 2015-07-23 DIAGNOSIS — M25561 Pain in right knee: Secondary | ICD-10-CM

## 2015-07-23 DIAGNOSIS — M25512 Pain in left shoulder: Secondary | ICD-10-CM

## 2015-07-23 DIAGNOSIS — M25612 Stiffness of left shoulder, not elsewhere classified: Secondary | ICD-10-CM | POA: Diagnosis not present

## 2015-07-23 DIAGNOSIS — M25511 Pain in right shoulder: Secondary | ICD-10-CM

## 2015-07-23 NOTE — Therapy (Signed)
[facility] [address] [place], [place], [address] Phone: [phone]   Fax:  [phone]  Physical Therapy Treatment  Patient Details  Name: David [name] MRN: [id number] Date of Birth: [birth year] Referring Provider: [name], [name], MD  Encounter Date: 07/23/2015      PT End of Session - 07/23/15 1409    Visit Number 6   Number of Visits 10   Date for PT Re-Evaluation 08/26/15   PT Start Time [id number]   PT Stop Time 1444   PT Time Calculation (min) 45 min   Activity Tolerance Patient tolerated treatment well   Behavior During Therapy [facility] for tasks assessed/performed      Past Medical History  Diagnosis Date  . Hyperlipidemia   . Hypertension   . LBP (low back pain) 06/2005    L5 radicular symptoms; MRI of LS spine severe spondylosis at L4-L5 with central cancal stenosis   . History of kidney stones     2007  . Spermatocele   . BPH (benign prostatic hypertrophy)   . Organic impotence   . Nocturia   . Arthritis     gen.  and left hip  . Wears glasses   . Prostate cancer ([place]) 10/2011    (Low grade) [facility] - Dr. Junious Silk  . GERD (gastroesophageal reflux disease)     occasional  . Kidney tumor   . Pyelonephritis 03/05/2013  . Renal oncocytoma of right kidney 01/23/2013    As 3.2 x 3.5 cm enhancing exophytic mass projecting off the lower pole. This is consistent with a renal cell carcinoma. No retroperitoneal lymphadenopathy.   [name] 03/05/2013    Past Surgical History  Procedure Laterality Date  . Colonoscopy  02/15/2006, 2014    Internal hemorrhoids (Dr [name]), last in 2014  . Repair umbilical and ventral hernia's w/ mesh  08-21-2009  . Transurethral resection of prostate  AGE 28  . Tonsillectomy and adenoidectomy  as child  . Inguinal hernia repair Right 1980  . Spermatocelectomy Left 12/18/2012    Procedure: LEFT SPERMATOCELECTOMY;  Surgeon: [name], MD;  Location:  [facility];  Service: Urology;  Laterality: Left;  . Prostate biopsy N/A 12/18/2012    Procedure: BIOPSY TRANSRECTAL ULTRASONIC PROSTATE (TUBP);  Surgeon: [name], MD;  Location: [facility];  Service: Urology;  Laterality: N/A;  . Prostate biopsy  12/05/11    gleason 6, 3/12 cores  . Robotic assited partial nephrectomy Right 02/15/2013    Procedure: ROBOTIC ASSITED PARTIAL NEPHRECTOMY;  Surgeon: [name], MD;  Location: [facility];  Service: Urology;  Laterality: Right;  . Robot assisted laparoscopic radical prostatectomy N/A 02/15/2013    Procedure: ROBOTIC ASSISTED LAPAROSCOPIC RADICAL PROSTATECTOMY;  Surgeon: [name], MD;  Location: [facility];  Service: Urology;  Laterality: N/A;  . Lymphadenectomy Bilateral 02/15/2013    Procedure: LYMPHADENECTOMY WITH INDOCYANINE GREEN DYE INJECTION;  Surgeon: [name], MD;  Location: [facility];  Service: Urology;  Laterality: Bilateral;    There were no vitals filed for this visit.      Subjective Assessment - 07/23/15 1406    Subjective Pt reports Right knee and left shoulder are worse, today the wrist are hurting   Diagnostic tests none recent.    Patient Stated Goals reduce knee pain, increased ease with getting dressed, reduce shoulder pain    Currently in Pain? Yes   Pain Score 2    Pain Location Shoulder   Pain  Orientation Left   Pain Descriptors / Indicators Aching   Pain Type Chronic pain   Pain Onset More than a month ago   Pain Frequency Constant   Aggravating Factors  behind the back, taking of jacket   Pain Relieving Factors getting in a warm shower   Multiple Pain Sites Yes   Pain Score 3   Pain Location Knee   Pain Orientation Right   Pain Descriptors / Indicators Shooting   Pain Type Chronic pain   Pain Onset More than a month ago   Pain Frequency Intermittent   Aggravating Factors  standing, mowing lawn   Pain Relieving Factors sitting to rest, elevation                          [facility] Adult PT Treatment/Exercise - 07/23/15 0001    Bed Mobility   Bed Mobility --  Pt wishes to be called David [name]   Posture/Postural Control   Posture/Postural Control Postural limitations   Postural Limitations Rounded Shoulders;Forward head;Increased thoracic kyphosis   Knee/Hip Exercises: Stretches   Active Hamstring Stretch 30 seconds;Both;3 reps  on stairs   Other Knee/Hip Stretches with UE up doorframe and accross x 5 Rt/Lt   Knee/Hip Exercises: Aerobic   Nustep L1 10 min seat/arms # 12, 0.71mph   Knee/Hip Exercises: Standing   Lateral Step Up Right;Left;1 set;10 reps;Hand Hold: 0  Rt LE is more challenging   Forward Step Up Right;Left;1 set;10 reps;Step Height: 6"   Knee/Hip Exercises: Seated   Sit to Sand 5 reps  x5 with arms on the side, x 5 with arms crossed   Shoulder Exercises: Prone   Other Prone Exercises shoulder extension, flexion, scaption at 120 degrees 3# 10x each bilaterally   Shoulder Exercises: Standing   External Rotation Strengthening;Right;Left;15 reps;Theraband   Theraband Level (Shoulder External Rotation) Level 2 (Red)   Internal Rotation Strengthening;Right;Left;15 reps;Theraband   Theraband Level (Shoulder Internal Rotation) Level 2 (Red)   Flexion Strengthening;Right;Left;15 reps;Theraband   Theraband Level (Shoulder Flexion) Level 2 (Red)   Extension Strengthening;Left;Right;15 reps;Theraband   Theraband Level (Shoulder Extension) Level 2 (Red)   Row Strengthening;Right;Left;15 reps;Theraband   Theraband Level (Shoulder Row) Level 2 (Red)   Iontophoresis   Location discontinued                  PT Short Term Goals - 07/21/15 1426    PT SHORT TERM GOAL #1   Title be independent in initial HEP   Time 4   Period Weeks   Status Achieved   PT SHORT TERM GOAL #2   Title report a 30% reduction in bil. knee pain with yardwork and standing   Time 4   Period Weeks   Status On-going   PT SHORT TERM  GOAL #3   Title report 30% increased ease with getting dressed due to improve shoulder flexibility   Time 4   Period Weeks   Status On-going   PT SHORT TERM GOAL #4   Title report a 30% reduction in shoulder pain with use   Time 4   Period Weeks   Status On-going           PT Long Term Goals - 07/16/15 1547    PT LONG TERM GOAL #1   Title be independent in advanced HEP   Time 8   Period Weeks   Status On-going   PT LONG TERM GOAL #2   Title reduce FOTO to <  or = to 37% limitation   Time 8   Period Weeks   Status On-going   PT LONG TERM GOAL #3   Title report a 60% reduction in bil. knee pain with yardwork and standing   Time 8   Period Weeks   Status On-going   PT LONG TERM GOAL #4   Title report 60% increased ease with getting dressed due to improved flexibility   Time 8   Period Weeks   Status On-going   PT LONG TERM GOAL #5   Title report a 60% reduction in shoulder pain with use with ADLs and self-care   Time 8   Period Weeks   Status On-going               Plan - 07/23/15 1409    Clinical Impression Statement Pt reports he continues to feel improvement with his overall strength. Pt will continue to benefit from skilled PT for LE/UE strengthening and pain control   Rehab Potential Good   PT Frequency 2x / week   PT Duration 8 weeks   PT Treatment/Interventions ADLs/Self Care Home Management;Cryotherapy;Electrical Stimulation;Moist Heat;Therapeutic exercise;Therapeutic activities;Functional mobility training;Stair training;Gait training;Neuromuscular re-education;Manual techniques;Passive range of motion;Dry needling;Iontophoresis 4mg /ml Dexamethasone   PT Next Visit Plan Nu-Step;   scapular exercises;   leg press;  step ups; check progress toward STGs   Consulted and Agree with Plan of Care Patient      Patient will benefit from skilled therapeutic intervention in order to improve the following deficits and impairments:  Postural dysfunction,  Decreased strength, Impaired flexibility, Pain, Improper body mechanics, Decreased activity tolerance  Visit Diagnosis: Pain in left knee  Pain in right knee  Pain in left shoulder  Pain in right shoulder  Stiffness of left shoulder, not elsewhere classified  Stiffness of right shoulder, not elsewhere classified     Problem List Patient Active Problem List   Diagnosis Date Noted  . GERD (gastroesophageal reflux disease) 06/25/2015  . Preventative health care 05/21/2014  . Pulmonary nodule 07/05/2013  . Other malaise and fatigue 06/07/2013  . Blood loss anemia 03/18/2013  . Right retroperitoneal urinoma with ureteral leak 03/05/2013  . Pyelonephritis 03/05/2013  . Renal oncocytoma of right kidney 01/23/2013  . Nocturia 01/11/2013  . Left hip pain 03/05/2012  . Hyperglycemia 06/08/2011  . Prostate cancer ([place]) 05/04/2010  . HERNIA, UMBILICAL [id number]  . ERECTILE DYSFUNCTION, ORGANIC 03/10/2009  . Hyperlipidemia 03/06/2008  . Essential hypertension 03/06/2008    NAUMANN-HOUEGNIFIO,Zori Benbrook PTA 07/23/2015, 2:45 PM  [facility] [address] [place], [place], [address] Phone: [phone]   Fax:  [phone]  Name: David [name] MRN: [id number] Date of Birth: Sep 14, 1940

## 2015-07-28 ENCOUNTER — Ambulatory Visit: Payer: Medicare Other | Admitting: Physical Therapy

## 2015-07-28 DIAGNOSIS — M25561 Pain in right knee: Secondary | ICD-10-CM | POA: Diagnosis not present

## 2015-07-28 DIAGNOSIS — M25562 Pain in left knee: Secondary | ICD-10-CM | POA: Diagnosis not present

## 2015-07-28 DIAGNOSIS — M25512 Pain in left shoulder: Secondary | ICD-10-CM

## 2015-07-28 DIAGNOSIS — M25612 Stiffness of left shoulder, not elsewhere classified: Secondary | ICD-10-CM | POA: Diagnosis not present

## 2015-07-28 DIAGNOSIS — M25511 Pain in right shoulder: Secondary | ICD-10-CM

## 2015-07-28 DIAGNOSIS — M25611 Stiffness of right shoulder, not elsewhere classified: Secondary | ICD-10-CM | POA: Diagnosis not present

## 2015-07-28 NOTE — Therapy (Signed)
[facility] [address] [place], [place], [address] Phone: [phone]   Fax:  [phone]  Physical Therapy Treatment  Patient Details  Name: David [name] MRN: [id number] Date of Birth: [birth year] Referring Provider: [name], [name], MD  Encounter Date: 07/28/2015      PT End of Session - 07/28/15 1423    Visit Number 7   Number of Visits 10   Date for PT Re-Evaluation 08/26/15   PT Start Time 1400   PT Stop Time 1440   PT Time Calculation (min) 40 min   Activity Tolerance Patient tolerated treatment well      Past Medical History  Diagnosis Date  . Hyperlipidemia   . Hypertension   . LBP (low back pain) 06/2005    L5 radicular symptoms; MRI of LS spine severe spondylosis at L4-L5 with central cancal stenosis   . History of kidney stones     2007  . Spermatocele   . BPH (benign prostatic hypertrophy)   . Organic impotence   . Nocturia   . Arthritis     gen.  and left hip  . Wears glasses   . Prostate cancer ([place]) 10/2011    (Low grade) [facility] - Dr. Junious Silk  . GERD (gastroesophageal reflux disease)     occasional  . Kidney tumor   . Pyelonephritis 03/05/2013  . Renal oncocytoma of right kidney 01/23/2013    As 3.2 x 3.5 cm enhancing exophytic mass projecting off the lower pole. This is consistent with a renal cell carcinoma. No retroperitoneal lymphadenopathy.   [name] 03/05/2013    Past Surgical History  Procedure Laterality Date  . Colonoscopy  02/15/2006, 2014    Internal hemorrhoids (Dr [name]), last in 2014  . Repair umbilical and ventral hernia's w/ mesh  08-21-2009  . Transurethral resection of prostate  AGE 75  . Tonsillectomy and adenoidectomy  as child  . Inguinal hernia repair Right 1980  . Spermatocelectomy Left 12/18/2012    Procedure: LEFT SPERMATOCELECTOMY;  Surgeon: [name], MD;  Location: [facility];  Service: Urology;  Laterality:  Left;  . Prostate biopsy N/A 12/18/2012    Procedure: BIOPSY TRANSRECTAL ULTRASONIC PROSTATE (TUBP);  Surgeon: [name], MD;  Location: [facility];  Service: Urology;  Laterality: N/A;  . Prostate biopsy  12/05/11    gleason 6, 3/12 cores  . Robotic assited partial nephrectomy Right 02/15/2013    Procedure: ROBOTIC ASSITED PARTIAL NEPHRECTOMY;  Surgeon: [name], MD;  Location: [facility];  Service: Urology;  Laterality: Right;  . Robot assisted laparoscopic radical prostatectomy N/A 02/15/2013    Procedure: ROBOTIC ASSISTED LAPAROSCOPIC RADICAL PROSTATECTOMY;  Surgeon: [name], MD;  Location: [facility];  Service: Urology;  Laterality: N/A;  . Lymphadenectomy Bilateral 02/15/2013    Procedure: LYMPHADENECTOMY WITH INDOCYANINE GREEN DYE INJECTION;  Surgeon: [name], MD;  Location: [facility];  Service: Urology;  Laterality: Bilateral;    There were no vitals filed for this visit.      Subjective Assessment - 07/28/15 1400    Subjective "I'm doing OK for an old man."  My finger joints are better today.     Currently in Pain? Yes   Pain Score 2    Pain Location Shoulder   Pain Orientation Left   Pain Type Chronic pain   Pain Score 2   Pain Location Knee   Pain Orientation Right  [place] Adult PT Treatment/Exercise - 07/28/15 0001    Knee/Hip Exercises: Stretches   Other Knee/Hip Stretches with UE up doorframe and accross x 5 Rt/Lt   Knee/Hip Exercises: Aerobic   Nustep L1 10 min seat/arms # 12, 0.63mh   Knee/Hip Exercises: Machines for Strengthening   Cybex Leg Press B 65# 20x; Single leg 40# 20x each   Knee/Hip Exercises: Standing   Other Standing Knee Exercises SLS  with opposite leg on BOSU and UE diagonals 20x R/L   Other Standing Knee Exercises side stepping with blue band around thighs 8x 8 feet   Knee/Hip Exercises: Seated   Sit to Sand 5 reps  x5 with arms on the side, x 5 with arms crossed    Shoulder Exercises: Prone   Other Prone Exercises kneeling on table shoulder rows, extension 4# 20x; horizontal abd and scaption 2# 10x each   bilaterally   Shoulder Exercises: Power [employer]20 reps  25#    Other Power T[employer]identExercises Lat bar 20# 20x                  PT Short Term Goals - 07/28/15 1425    PT SHORT TERM GOAL #1   Title be independent in initial HEP   Status Achieved   PT SHORT TERM GOAL #2   Title report a 30% reduction in bil. knee pain with yardwork and standing   Baseline 10-15% better 07/28/15   Time 4   Period Weeks   Status On-going   PT SHORT TERM GOAL #3   Title report 30% increased ease with getting dressed due to improve shoulder flexibility   Baseline 25% 07/28/15   Time 4   Period Weeks   Status Partially Met   PT SHORT TERM GOAL #4   Title report a 30% reduction in shoulder pain with use   Time 4   Period Weeks   Status On-going           PT Long Term Goals - 07/28/15 1426    PT LONG TERM GOAL #1   Title be independent in advanced HEP   Time 8   Period Weeks   Status On-going   PT LONG TERM GOAL #2   Title reduce FOTO to < or = to 37% limitation   Time 8   Period Weeks   Status On-going   PT LONG TERM GOAL #3   Title report a 60% reduction in bil. knee pain with yardwork and standing   Time 8   Period Weeks   Status On-going   PT LONG TERM GOAL #4   Title report 60% increased ease with getting dressed due to improved flexibility   Time 8   Period Weeks   Status On-going   PT LONG TERM GOAL #5   Title report a 60% reduction in shoulder pain with use with ADLs and self-care   Time 8   Period Weeks   Status On-going               Plan - 07/28/15 1423    Clinical Impression Statement The patient is improving although functional improvements on shoulder are only 25% better and knees only 10-15% better.  He is able to perform progressive strengthening without complaints of pain although muscle fatigue is  present.  Continue to promote independence with HEP  for further improvements in ROM and strength over time.    Therapist closely monitoring response with modifcations as needed.  PT Next Visit Plan Do MD note visit;  prepare for discharge to HEP in 2-3 visits;  assess response to leg press, lat bar, rows;  recheck shoulder AROM      Patient will benefit from skilled therapeutic intervention in order to improve the following deficits and impairments:     Visit Diagnosis: Pain in left knee  Pain in right knee  Pain in left shoulder  Pain in right shoulder     Problem List Patient Active Problem List   Diagnosis Date Noted  . GERD (gastroesophageal reflux disease) 06/25/2015  . Preventative health care 05/21/2014  . Pulmonary nodule 07/05/2013  . Other malaise and fatigue 06/07/2013  . Blood loss anemia 03/18/2013  . Right retroperitoneal urinoma with ureteral leak 03/05/2013  . Pyelonephritis 03/05/2013  . Renal oncocytoma of right kidney 01/23/2013  . Nocturia 01/11/2013  . Left hip pain 03/05/2012  . Hyperglycemia 06/08/2011  . Prostate cancer ([place]) 05/04/2010  . HERNIA, UMBILICAL 45/36/4680  . ERECTILE DYSFUNCTION, ORGANIC 03/10/2009  . Hyperlipidemia 03/06/2008  . Essential hypertension 03/06/2008   Ruben Im, PT 07/28/2015 2:59 PM Phone: [phone] Fax: [phone]  Alvera Singh 07/28/2015, 2:58 PM  [facility] [address] [place], [place], [address] Phone: [phone]   Fax:  [phone]  Name: DAMARKUS BALIS MRN: [id number] Date of Birth: 1940/05/14

## 2015-07-30 ENCOUNTER — Encounter: Payer: Self-pay | Admitting: Physical Therapy

## 2015-07-30 ENCOUNTER — Ambulatory Visit: Payer: Medicare Other | Attending: Family Medicine | Admitting: Physical Therapy

## 2015-07-30 DIAGNOSIS — M25611 Stiffness of right shoulder, not elsewhere classified: Secondary | ICD-10-CM | POA: Diagnosis not present

## 2015-07-30 DIAGNOSIS — M25561 Pain in right knee: Secondary | ICD-10-CM | POA: Insufficient documentation

## 2015-07-30 DIAGNOSIS — M25512 Pain in left shoulder: Secondary | ICD-10-CM | POA: Diagnosis not present

## 2015-07-30 DIAGNOSIS — M25511 Pain in right shoulder: Secondary | ICD-10-CM | POA: Diagnosis not present

## 2015-07-30 DIAGNOSIS — M25612 Stiffness of left shoulder, not elsewhere classified: Secondary | ICD-10-CM | POA: Insufficient documentation

## 2015-07-30 DIAGNOSIS — M25562 Pain in left knee: Secondary | ICD-10-CM | POA: Diagnosis not present

## 2015-07-30 NOTE — Therapy (Signed)
[facility] [address] [place], [place], [address] Phone: [phone]   Fax:  [phone]  Physical Therapy Treatment  Patient Details  Name: David [name] MRN: [id number] Date of Birth: [birth year] Referring Provider: [name], [name], MD  Encounter Date: 07/30/2015      PT End of Session - 07/30/15 1413    Visit Number 8   Number of Visits 10   Date for PT Re-Evaluation 08/26/15   PT Start Time 5102   PT Stop Time 1440   PT Time Calculation (min) 42 min   Activity Tolerance Patient tolerated treatment well   Behavior During Therapy [facility] for tasks assessed/performed      Past Medical History  Diagnosis Date  . Hyperlipidemia   . Hypertension   . LBP (low back pain) 06/2005    L5 radicular symptoms; MRI of LS spine severe spondylosis at L4-L5 with central cancal stenosis   . History of kidney stones     2007  . Spermatocele   . BPH (benign prostatic hypertrophy)   . Organic impotence   . Nocturia   . Arthritis     gen.  and left hip  . Wears glasses   . Prostate cancer ([place]) 10/2011    (Low grade) [facility] - Dr. Junious Silk  . GERD (gastroesophageal reflux disease)     occasional  . Kidney tumor   . Pyelonephritis 03/05/2013  . Renal oncocytoma of right kidney 01/23/2013    As 3.2 x 3.5 cm enhancing exophytic mass projecting off the lower pole. This is consistent with a renal cell carcinoma. No retroperitoneal lymphadenopathy.   [name] 03/05/2013    Past Surgical History  Procedure Laterality Date  . Colonoscopy  02/15/2006, 2014    Internal hemorrhoids (Dr [name]), last in 2014  . Repair umbilical and ventral hernia's w/ mesh  08-21-2009  . Transurethral resection of prostate  AGE 55  . Tonsillectomy and adenoidectomy  as child  . Inguinal hernia repair Right 1980  . Spermatocelectomy Left 12/18/2012    Procedure: LEFT SPERMATOCELECTOMY;  Surgeon: [name], MD;  Location:  [facility];  Service: Urology;  Laterality: Left;  . Prostate biopsy N/A 12/18/2012    Procedure: BIOPSY TRANSRECTAL ULTRASONIC PROSTATE (TUBP);  Surgeon: [name], MD;  Location: [facility];  Service: Urology;  Laterality: N/A;  . Prostate biopsy  12/05/11    gleason 6, 3/12 cores  . Robotic assited partial nephrectomy Right 02/15/2013    Procedure: ROBOTIC ASSITED PARTIAL NEPHRECTOMY;  Surgeon: [name], MD;  Location: [facility];  Service: Urology;  Laterality: Right;  . Robot assisted laparoscopic radical prostatectomy N/A 02/15/2013    Procedure: ROBOTIC ASSISTED LAPAROSCOPIC RADICAL PROSTATECTOMY;  Surgeon: [name], MD;  Location: [facility];  Service: Urology;  Laterality: N/A;  . Lymphadenectomy Bilateral 02/15/2013    Procedure: LYMPHADENECTOMY WITH INDOCYANINE GREEN DYE INJECTION;  Surgeon: [name], MD;  Location: [facility];  Service: Urology;  Laterality: Bilateral;    There were no vitals filed for this visit.      Subjective Assessment - 07/30/15 1406    Subjective Pt feels good with his knees, but his hands are hurting due to arthritis and using pruners in the garden   Pertinent History Injections into Rt shoulder 09/2014, Lt humerus fracture 1996   Diagnostic tests none recent.    Patient Stated Goals reduce knee pain, increased ease with getting dressed, reduce shoulder pain  Currently in Pain? Yes   Pain Score 2    Pain Location Shoulder   Pain Orientation Left   Pain Descriptors / Indicators Aching   Pain Type Chronic pain   Pain Onset More than a month ago   Pain Frequency Constant   Aggravating Factors  moves behind the back, taking of jacket   Pain Relieving Factors getting in a warm shower   Multiple Pain Sites Yes   Pain Score 3   Pain Location Knee   Pain Orientation Right   Pain Descriptors / Indicators Shooting   Pain Type Chronic pain   Pain Onset More than a month ago   Pain Frequency  Intermittent   Aggravating Factors  standing, mowing lawn    Pain Relieving Factors sitting to rest, elevation                         [facility] Adult PT Treatment/Exercise - 07/30/15 0001    Bed Mobility   Bed Mobility --  Pt wishes to be called David [name]    Posture/Postural Control   Posture/Postural Control Postural limitations   Postural Limitations Rounded Shoulders;Forward head;Increased thoracic kyphosis   Knee/Hip Exercises: Stretches   Other Knee/Hip Stretches with UE up doorframe and accross x 5 Rt/Lt   Knee/Hip Exercises: Aerobic   Stationary Bike L1 x 6 min   Knee/Hip Exercises: Machines for Strengthening   Cybex Leg Press B 70# 30x; Single leg 40# 20x each  increase weight with B LE   Knee/Hip Exercises: Standing   Other Standing Knee Exercises SLS  with opposite leg on BOSU and UE diagonals 20x R/L   Other Standing Knee Exercises side stepping with blue band around thighs 8x 8 feet   Knee/Hip Exercises: Seated   Sit to Sand 2 sets;5 reps  with yellow plyoball    Shoulder Exercises: Prone   Other Prone Exercises kneeling on table shoulder rows, extension 5# 20x; horizontal abd and scaption 4# 20x each    bilateral   Shoulder Exercises: Power [employer] 20 reps  25#   Other Power [employer] Exercises Lat bar 30# 20x in sitting                  PT Short Term Goals - 07/28/15 1425    PT SHORT TERM GOAL #1   Title be independent in initial HEP   Status Achieved   PT SHORT TERM GOAL #2   Title report a 30% reduction in bil. knee pain with yardwork and standing   Baseline 10-15% better 07/28/15   Time 4   Period Weeks   Status On-going   PT SHORT TERM GOAL #3   Title report 30% increased ease with getting dressed due to improve shoulder flexibility   Baseline 25% 07/28/15   Time 4   Period Weeks   Status Partially Met   PT SHORT TERM GOAL #4   Title report a 30% reduction in shoulder pain with use   Time 4   Period Weeks   Status On-going            PT Long Term Goals - 07/28/15 1426    PT LONG TERM GOAL #1   Title be independent in advanced HEP   Time 8   Period Weeks   Status On-going   PT LONG TERM GOAL #2   Title reduce FOTO to < or = to 37% limitation   Time 8   Period  Weeks   Status On-going   PT LONG TERM GOAL #3   Title report a 60% reduction in bil. knee pain with yardwork and standing   Time 8   Period Weeks   Status On-going   PT LONG TERM GOAL #4   Title report 60% increased ease with getting dressed due to improved flexibility   Time 8   Period Weeks   Status On-going   PT LONG TERM GOAL #5   Title report a 60% reduction in shoulder pain with use with ADLs and self-care   Time 8   Period Weeks   Status On-going               Plan - 07/30/15 1417    Clinical Impression Statement Pt tolerated activities well and able to incr weight from 4# to 5# with Rt UE extension. Pt will continue to benefit from skilled PT to advance strength and ROM.     Rehab Potential Good   PT Frequency 2x / week   PT Duration 8 weeks   PT Treatment/Interventions ADLs/Self Care Home Management;Cryotherapy;Electrical Stimulation;Moist Heat;Therapeutic exercise;Therapeutic activities;Functional mobility training;Stair training;Gait training;Neuromuscular re-education;Manual techniques;Passive range of motion;Dry needling;Iontophoresis 6m/ml Dexamethasone   PT Next Visit Plan Prepare MD note and prepare for discharge to HEP next week.    Consulted and Agree with Plan of Care Patient      Patient will benefit from skilled therapeutic intervention in order to improve the following deficits and impairments:  Postural dysfunction, Decreased strength, Impaired flexibility, Pain, Improper body mechanics, Decreased activity tolerance  Visit Diagnosis: Pain in left knee  Pain in right knee  Pain in left shoulder  Pain in right shoulder     Problem List Patient Active Problem List   Diagnosis Date Noted  .  GERD (gastroesophageal reflux disease) 06/25/2015  . Preventative health care 05/21/2014  . Pulmonary nodule 07/05/2013  . Other malaise and fatigue 06/07/2013  . Blood loss anemia 03/18/2013  . Right retroperitoneal urinoma with ureteral leak 03/05/2013  . Pyelonephritis 03/05/2013  . Renal oncocytoma of right kidney 01/23/2013  . Nocturia 01/11/2013  . Left hip pain 03/05/2012  . Hyperglycemia 06/08/2011  . Prostate cancer ([place] 05/04/2010  . HERNIA, UMBILICAL 025/00/3704 . ERECTILE DYSFUNCTION, ORGANIC 03/10/2009  . Hyperlipidemia 03/06/2008  . Essential hypertension 03/06/2008    NAUMANN-HOUEGNIFIO,Laiylah Roettger PTA 07/30/2015, 2:46 PM  [facility] [address] [place] [address]: [phone]  Fax:  [phone] Name: David POFFENBERGERMRN: [id number] of Birth: [birth year]

## 2015-08-04 ENCOUNTER — Ambulatory Visit: Payer: Medicare Other

## 2015-08-04 DIAGNOSIS — M25511 Pain in right shoulder: Secondary | ICD-10-CM

## 2015-08-04 DIAGNOSIS — M25612 Stiffness of left shoulder, not elsewhere classified: Secondary | ICD-10-CM

## 2015-08-04 DIAGNOSIS — M25611 Stiffness of right shoulder, not elsewhere classified: Secondary | ICD-10-CM | POA: Diagnosis not present

## 2015-08-04 DIAGNOSIS — M25562 Pain in left knee: Secondary | ICD-10-CM

## 2015-08-04 DIAGNOSIS — M25512 Pain in left shoulder: Secondary | ICD-10-CM | POA: Diagnosis not present

## 2015-08-04 DIAGNOSIS — M25561 Pain in right knee: Secondary | ICD-10-CM | POA: Diagnosis not present

## 2015-08-04 NOTE — Therapy (Signed)
[facility] [address] [place], [place], [address] Phone: [phone]   Fax:  [phone]  Physical Therapy Treatment  Patient Details  Name: David [name] MRN: [id number] Date of Birth: 01/11/1941 Referring Provider: [name], [name], MD  Encounter Date: 08/04/2015      PT End of Session - 08/04/15 1439    Visit Number 9   Number of Visits 10   Date for PT Re-Evaluation 08/26/15   PT Start Time 1400   PT Stop Time 1444   PT Time Calculation (min) 44 min   Activity Tolerance Patient tolerated treatment well   Behavior During Therapy [facility] for tasks assessed/performed      Past Medical History  Diagnosis Date  . Hyperlipidemia   . Hypertension   . LBP (low back pain) 06/2005    L5 radicular symptoms; MRI of LS spine severe spondylosis at L4-L5 with central cancal stenosis   . History of kidney stones     2007  . Spermatocele   . BPH (benign prostatic hypertrophy)   . Organic impotence   . Nocturia   . Arthritis     gen.  and left hip  . Wears glasses   . Prostate cancer ([place]) 10/2011    (Low grade) [facility] - Dr. Junious Silk  . GERD (gastroesophageal reflux disease)     occasional  . Kidney tumor   . Pyelonephritis 03/05/2013  . Renal oncocytoma of right kidney 01/23/2013    As 3.2 x 3.5 cm enhancing exophytic mass projecting off the lower pole. This is consistent with a renal cell carcinoma. No retroperitoneal lymphadenopathy.   [name] 03/05/2013    Past Surgical History  Procedure Laterality Date  . Colonoscopy  02/15/2006, 2014    Internal hemorrhoids (Dr [name]), last in 2014  . Repair umbilical and ventral hernia's w/ mesh  08-21-2009  . Transurethral resection of prostate  AGE 46  . Tonsillectomy and adenoidectomy  as child  . Inguinal hernia repair Right 1980  . Spermatocelectomy Left 12/18/2012    Procedure: LEFT SPERMATOCELECTOMY;  Surgeon: [name], MD;  Location:  [facility];  Service: Urology;  Laterality: Left;  . Prostate biopsy N/A 12/18/2012    Procedure: BIOPSY TRANSRECTAL ULTRASONIC PROSTATE (TUBP);  Surgeon: [name], MD;  Location: [facility];  Service: Urology;  Laterality: N/A;  . Prostate biopsy  12/05/11    gleason 6, 3/12 cores  . Robotic assited partial nephrectomy Right 02/15/2013    Procedure: ROBOTIC ASSITED PARTIAL NEPHRECTOMY;  Surgeon: [name], MD;  Location: [facility];  Service: Urology;  Laterality: Right;  . Robot assisted laparoscopic radical prostatectomy N/A 02/15/2013    Procedure: ROBOTIC ASSISTED LAPAROSCOPIC RADICAL PROSTATECTOMY;  Surgeon: [name], MD;  Location: [facility];  Service: Urology;  Laterality: N/A;  . Lymphadenectomy Bilateral 02/15/2013    Procedure: LYMPHADENECTOMY WITH INDOCYANINE GREEN DYE INJECTION;  Surgeon: [name], MD;  Location: [facility];  Service: Urology;  Laterality: Bilateral;    There were no vitals filed for this visit.      Subjective Assessment - 08/04/15 1403    Subjective Going to see MD on Thursday.  Pt reports 50% better over all.     Currently in Pain? Yes   Pain Score 0-No pain  up to 4/10   Pain Orientation Left;Right   Pain Descriptors / Indicators Aching   Pain Type Chronic pain   Pain Onset More than a month ago  Pain Frequency Constant   Aggravating Factors  moving arms behind the back   Pain Relieving Factors getting in warm shower   Pain Score 3   Pain Location Knee   Pain Orientation Right   Pain Type Chronic pain   Pain Onset More than a month ago   Pain Frequency Intermittent   Aggravating Factors  standing, mowing the lawn   Pain Relieving Factors sitting to rest, elevation            [facility] PT Assessment - 08/04/15 0001    Assessment   Medical Diagnosis generalized OA of multiple sites (M15.9), impingement syndroms, shoulder (M75.40)   Onset Date/Surgical Date 01/31/15   Observation/Other  Assessments   Focus on Therapeutic Outcomes (FOTO)  33% limitation   AROM   Right/Left Shoulder Right;Left   Right Shoulder Flexion 121 Degrees   Right Shoulder ABduction 94 Degrees   Left Shoulder Flexion 113 Degrees   Left Shoulder ABduction 76 Degrees   Strength   Overall Strength Comments Bil shoulder 4+/5                      [facility] Adult PT Treatment/Exercise - 08/04/15 0001    Knee/Hip Exercises: Aerobic   Stationary Bike L1 x 6 min   Knee/Hip Exercises: Machines for Strengthening   Cybex Leg Press B 90# 30x; Single leg 45# 20x each  increase weight with B LE   Knee/Hip Exercises: Standing   Other Standing Knee Exercises --   Other Standing Knee Exercises --   Knee/Hip Exercises: Seated   Sit to Sand 2 sets;10 reps;without UE support  with yellow plyoball    Shoulder Exercises: Standing   External Rotation Strengthening;Right;Left;15 reps;Theraband   Theraband Level (Shoulder External Rotation) Level 3 (Green)   Internal Rotation Strengthening;Right;Left;15 reps;Theraband   Theraband Level (Shoulder Internal Rotation) Level 3 (Green)   Flexion Strengthening;Right;Left;15 reps;Theraband   Theraband Level (Shoulder Flexion) Level 3 (Green)   Extension Strengthening;Left;Right;15 reps;Theraband   Theraband Level (Shoulder Extension) Level 3 (Green)   Row Strengthening;Right;Left;15 reps;Theraband   Theraband Level (Shoulder Row) Level 3 (Green)   Shoulder Exercises: Power [employer] 20 reps  25#   Other Power [employer] Exercises Lat bar 30# 20x in sitting                  PT Short Term Goals - 08/04/15 1412    PT SHORT TERM GOAL #3   Title report 30% increased ease with getting dressed due to improve shoulder flexibility   Status Achieved   PT SHORT TERM GOAL #4   Title report a 30% reduction in shoulder pain with use   Status Achieved           PT Long Term Goals - 08/04/15 1414    PT LONG TERM GOAL #2   Title reduce FOTO to < or = to  37% limitation   Status Achieved   PT LONG TERM GOAL #3   Title report a 60% reduction in bil. knee pain with yardwork and standing   Time 8   Period Weeks   Status On-going  50% limitation   PT LONG TERM GOAL #4   Title report 60% increased ease with getting dressed due to improved flexibility   Status Achieved   PT LONG TERM GOAL #5   Title report a 60% reduction in shoulder pain with use with ADLs and self-care   Status Achieved  Plan - 08/04/15 1416    Clinical Impression Statement Pt reports a 50% overall improvement in symptoms since the start of care regarding knee pain.  Shoulders are 60% better.  FOTO score is 33% limited (reduced from 55% at evaluation).  Pt has HEP in place and is compliant with these exercises.  Pt plans to join [employer] and continue with gym exercises after discharge.  Pt would like to see MD and follow-up with PT after his MD appt with probable D/C at that time to gym program and HEP.     Rehab Potential Good   PT Frequency 2x / week   PT Duration 8 weeks   PT Treatment/Interventions ADLs/Self Care Home Management;Cryotherapy;Electrical Stimulation;Moist Heat;Therapeutic exercise;Therapeutic activities;Functional mobility training;Stair training;Gait training;Neuromuscular re-education;Manual techniques;Passive range of motion;Dry needling;Iontophoresis 4mg /ml Dexamethasone   PT Next Visit Plan Probable D/C next sesion.  G-codes (FOTO is already done)   Consulted and Agree with Plan of Care Patient      Patient will benefit from skilled therapeutic intervention in order to improve the following deficits and impairments:  Postural dysfunction, Decreased strength, Impaired flexibility, Pain, Improper body mechanics, Decreased activity tolerance  Visit Diagnosis: Pain in left knee  Pain in right knee  Pain in left shoulder  Pain in right shoulder  Stiffness of left shoulder, not elsewhere classified  Stiffness of right  shoulder, not elsewhere classified     Problem List Patient Active Problem List   Diagnosis Date Noted  . GERD (gastroesophageal reflux disease) 06/25/2015  . Preventative health care 05/21/2014  . Pulmonary nodule 07/05/2013  . Other malaise and fatigue 06/07/2013  . Blood loss anemia 03/18/2013  . Right retroperitoneal urinoma with ureteral leak 03/05/2013  . Pyelonephritis 03/05/2013  . Renal oncocytoma of right kidney 01/23/2013  . Nocturia 01/11/2013  . Left hip pain 03/05/2012  . Hyperglycemia 06/08/2011  . Prostate cancer ([place]) 05/04/2010  . HERNIA, UMBILICAL [id number]  . ERECTILE DYSFUNCTION, ORGANIC 03/10/2009  . Hyperlipidemia 03/06/2008  . Essential hypertension 03/06/2008    David [name], PT 08/04/2015 2:42 PM  [facility] [address] [place], [place], [address] Phone: [phone]   Fax:  [phone]  Name: David [name] MRN: [id number] Date of Birth: 05-Dec-1940

## 2015-08-06 ENCOUNTER — Ambulatory Visit (INDEPENDENT_AMBULATORY_CARE_PROVIDER_SITE_OTHER): Payer: Medicare Other | Admitting: Family Medicine

## 2015-08-06 ENCOUNTER — Encounter: Payer: Self-pay | Admitting: Family Medicine

## 2015-08-06 ENCOUNTER — Ambulatory Visit: Payer: Medicare Other | Admitting: Physical Therapy

## 2015-08-06 VITALS — BP 90/60 | HR 66 | Temp 98.0°F | Resp 12 | Ht 72.0 in | Wt 211.0 lb

## 2015-08-06 DIAGNOSIS — M25561 Pain in right knee: Secondary | ICD-10-CM | POA: Diagnosis not present

## 2015-08-06 DIAGNOSIS — M159 Polyosteoarthritis, unspecified: Secondary | ICD-10-CM | POA: Diagnosis not present

## 2015-08-06 DIAGNOSIS — M25611 Stiffness of right shoulder, not elsewhere classified: Secondary | ICD-10-CM

## 2015-08-06 DIAGNOSIS — I10 Essential (primary) hypertension: Secondary | ICD-10-CM

## 2015-08-06 DIAGNOSIS — M25562 Pain in left knee: Secondary | ICD-10-CM

## 2015-08-06 DIAGNOSIS — M25512 Pain in left shoulder: Secondary | ICD-10-CM

## 2015-08-06 DIAGNOSIS — M25612 Stiffness of left shoulder, not elsewhere classified: Secondary | ICD-10-CM

## 2015-08-06 DIAGNOSIS — M25511 Pain in right shoulder: Secondary | ICD-10-CM | POA: Diagnosis not present

## 2015-08-06 MED ORDER — CAPSAICIN-MENTHOL-METHYL SAL 0.025-1-12 % EX CREA: 1.0000 "application " | TOPICAL_CREAM | Freq: Three times a day (TID) | CUTANEOUS | Status: DC | PRN

## 2015-08-06 MED ORDER — DULOXETINE HCL 30 MG PO CPEP: 30.0000 mg | ORAL_CAPSULE | Freq: Every day | ORAL | Status: DC

## 2015-08-06 MED ORDER — AMLODIPINE-OLMESARTAN 5-20 MG PO TABS: 0.5000 | ORAL_TABLET | Freq: Every day | ORAL | Status: DC

## 2015-08-06 NOTE — Progress Notes (Signed)
HPI:   [name] is a 75 y.o. male, who is here today to follow on recent office visit.  I saw him on April 27 and at that time Cymbalta 30 mg was started for generalized osteoarthritis, he is also doing physical therapy twice per week. He has tolerated medication well, he had some mild lightheadedness but denies any gastrointestinal or psychiatric side effects. In general he feels like stiffness and joint pains have improved at least 50%. He is able to complete his daily chores around the house. He could not afford Voltaren.  He also has had chronic fatigue, he takes naps in the middle of the day and doesn't feel like it helps.Overall he is feeling better, no new symptoms reported. He has history of prostatectomy and still getting up at night to go to the bathroom 3-4 times.  Hypertension:  Currently he is on Azor 5-20 mg, which was also increased last OV from 1/2 tab to 1 tab daily.[name] He is taking medications as instructed, no side effects reported.  He has not noted unusual headache, visual changes, exertional chest pain, dyspnea,  focal weakness, or edema.  Lab Results  Component Value Date   CREATININE 0.88 06/29/2015   BUN 21 06/29/2015   NA 135 06/29/2015   K 4.2 06/29/2015   CL 106 06/29/2015   CO2 21 06/29/2015   A couple BP readings at home low, one he remembers 85/50. He states that in general BP is "up and down".   Concerns today: none.    Review of Systems  Constitutional: Positive for fatigue (improved.). Negative for fever, activity change and appetite change.  HENT: Negative for facial swelling, mouth sores, nosebleeds and trouble swallowing.   Eyes: Negative for pain, redness and visual disturbance.  Respiratory: Negative for cough, shortness of breath and wheezing.   Cardiovascular: Negative for chest pain, palpitations and leg swelling.  Gastrointestinal: Negative for nausea, vomiting, abdominal pain, diarrhea and blood in stool.    Genitourinary: Negative for dysuria, hematuria and decreased urine volume.       Nocturia x 3-4   Musculoskeletal: Positive for back pain and arthralgias. Negative for joint swelling and gait problem.  Skin: Negative for rash.  Neurological: Positive for dizziness (occasional). Negative for weakness and headaches.  Psychiatric/Behavioral: Positive for sleep disturbance (due to nocturia). Negative for suicidal ideas, behavioral problems and confusion. The patient is not nervous/anxious.       Current Outpatient Prescriptions on File Prior to Visit  Medication Sig Dispense Refill  . aspirin EC 81 MG tablet Take 81 mg by mouth daily.    [name] atorvastatin (LIPITOR) 10 MG tablet Take 1 tablet (10 mg total) by mouth daily. 90 tablet 0  . cetirizine (ZYRTEC) 10 MG tablet Take 10 mg by mouth daily.    . fluticasone (FLONASE) 50 MCG/ACT nasal spray Place 2 sprays into both nostrils daily. 16 g 3  . omeprazole (PRILOSEC) 20 MG capsule take 1 capsule by mouth once daily 90 capsule 1  . sodium chloride (OCEAN) 0.65 % nasal spray Place 1 spray into the nose daily as needed for congestion.     No current facility-administered medications on file prior to visit.     Past Medical History  Diagnosis Date  . Hyperlipidemia   . Hypertension   . LBP (low back pain) 06/2005    L5 radicular symptoms; MRI of LS spine severe spondylosis at L4-L5 with central cancal stenosis   . History of  kidney stones     2007  . Spermatocele   . BPH (benign prostatic hypertrophy)   . Organic impotence   . Nocturia   . Arthritis     gen.  and left hip  . Wears glasses   . Prostate cancer ([place]) 10/2011    (Low grade) [facility] - Dr. Junious Silk  . GERD (gastroesophageal reflux disease)     occasional  . Kidney tumor   . Pyelonephritis 03/05/2013  . Renal oncocytoma of right kidney 01/23/2013    As 3.2 x 3.5 cm enhancing exophytic mass projecting off the lower pole. This is consistent with a renal cell carcinoma.  No retroperitoneal lymphadenopathy.   . 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 Ace Inhibitors Other (See Comments)    REACTION: Cough  . Codeine     Severe hiccups  . Percocet [Oxycodone-Acetaminophen]     Severe hiccups    Social History   Social History  . Marital Status: Married    Spouse Name: N/A  . Number of Children: N/A  . Years of Education: N/A   Social History Main Topics  . Smoking status: Former Smoker -- 0.25 packs/day for 10 years    Types: Cigarettes    Quit date: 02/28/1981  . Smokeless tobacco: Never Used  . Alcohol Use: 0.0 oz/week     Comment: occasional  . Drug Use: No  . Sexual Activity: Not Asked   Other Topics Concern  . None   Social History Narrative   Married- 66 yrs   2 daughters   5 grandchildren   Never Smoked   Alcohol use-yes (occasional/social)   Retired- Engineer, water      Physician roster:      Urologist - Dr. [name]   Orthopedics - Dr. [name]   Dermatology - Dr. Reino Kent Vitals:   08/06/15 0810  BP: 90/60  Pulse: 66  Temp: 98 F (36.7 C)  Resp: 12   Body mass index is 28.61 kg/(m^2).  SpO2 Readings from Last 3 Encounters:  08/06/15 96%  11/24/14 96%  06/07/13 97%     Physical Exam  Constitutional: He is oriented to person, place, and time. He appears well-developed and well-nourished. No distress.  HENT:  Head: Atraumatic.  Eyes: Conjunctivae and EOM are normal.  Cardiovascular: Normal rate and regular rhythm.   No murmur heard. Pulses:      Dorsalis pedis pulses are 2+ on the right side, and 2+ on the left side.  Respiratory: Effort normal and breath sounds normal. No respiratory distress.  Musculoskeletal: He exhibits no edema.  Shoulder active ROM: active abduction mildly limited, passive normal. Decreased external rotation, mild pain elicited. Wrist: mildly limited flexion, right some pain with ROM.  Knee crepitus bilateral. No signs of synovitis. Kyphosis.    Lymphadenopathy:    He has no cervical adenopathy.  Neurological: He is alert and oriented to person, place, and time. He has normal strength. Coordination normal.  Stable gait with no assistance.  Skin: Skin is warm. No erythema.  Psychiatric: He has a normal mood and affect.  Well groomed, good eye contact.      ASSESSMENT AND PLAN:     Revanth was seen today for follow-up.  Diagnoses and all orders for this visit:  Generalized osteoarthritis of multiple sites  In general he feels like physical therapy and Cymbalta have helped, now no changes on Cymbalta but eventually I would like to increase it  to 60 mg daily Some side effects of Cymbalta were discussed. Topical Capsaicin may also help, recommended using it at bedtime mainly. Avoid OTC NSAID's. Today he will have his last physical therapy section, he was advised to continue exercises at home, consider Harlin Rain. Fall precautions discussed.  -     DULoxetine (CYMBALTA) 30 MG capsule; Take 1 capsule (30 mg total) by mouth daily. -     Capsaicin-Menthol-Methyl Sal (CAPSAICIN-METHYL SAL-MENTHOL) 0.025-1-12 % CREA; Apply 1 application topically 3 (three) times daily as needed.   Essential hypertension  Hypotension. Decreased Azor to 1/2 tab since he just filled 3 months supply.May need to stop one of his antihypertensive meds if needed. Adequate hydration. Continue monitoring BP. F/U in 3 months, before if needed.   -     amLODipine-olmesartan (AZOR) 5-20 MG tablet; Take 0.5 tablets by mouth at bedtime.        -Mr Levingston was advised to return or notify a doctor immediately if worrisome symptoms or new concerns arise, he voices understanding.       Arshan Jabs G. [name], MD  [facility]. [place] office.

## 2015-08-06 NOTE — Patient Instructions (Signed)
A few things to remember from today's visit:   1. Essential hypertension   2. Generalized osteoarthritis of multiple sites  - DULoxetine (CYMBALTA) 30 MG capsule; Take 1 capsule (30 mg total) by mouth daily.  Dispense: 90 capsule; Refill: 1 - Capsaicin-Menthol-Methyl Sal (CAPSAICIN-METHYL SAL-MENTHOL) 0.025-1-12 % CREA; Apply 1 application topically 3 (three) times daily as needed.  Dispense: 160 g; Refill: 2    Changes today:  Capsaicin and decrease blood pressure medication in 1/2. Continue monitoring blood pressure. Goal blood pressure less 150/90.  Tai Kern [place] may help.    If you sign-up for My chart, you can communicate easier with [place] in case you have any question or concern.

## 2015-08-06 NOTE — Therapy (Signed)
[facility] [address], [place], [place], [address] Phone: [phone]   Fax:  [phone]  Physical Therapy Treatment/Discharge Summary  Patient Details  Name: David [name] MRN: [id number] Date of Birth: April 02, 1940 Referring Provider: [name], [name], MD  Encounter Date: 08/06/2015      PT End of Session - 08/06/15 1416    Visit Number 10   Number of Visits 10   Date for PT Re-Evaluation 08/26/15   PT Start Time 1400   PT Stop Time 1438   PT Time Calculation (min) 38 min   Activity Tolerance Patient tolerated treatment well      Past Medical History  Diagnosis Date  . Hyperlipidemia   . Hypertension   . LBP (low back pain) 06/2005    L5 radicular symptoms; MRI of LS spine severe spondylosis at L4-L5 with central cancal stenosis   . History of kidney stones     2007  . Spermatocele   . BPH (benign prostatic hypertrophy)   . Organic impotence   . Nocturia   . Arthritis     gen.  and left hip  . Wears glasses   . Prostate cancer ([place]) 10/2011    (Low grade) [facility] - Dr. Junious Silk  . GERD (gastroesophageal reflux disease)     occasional  . Kidney tumor   . Pyelonephritis 03/05/2013  . Renal oncocytoma of right kidney 01/23/2013    As 3.2 x 3.5 cm enhancing exophytic mass projecting off the lower pole. This is consistent with a renal cell carcinoma. No retroperitoneal lymphadenopathy.   [name] 03/05/2013    Past Surgical History  Procedure Laterality Date  . Colonoscopy  02/15/2006, 2014    Internal hemorrhoids (Dr [name]), last in 2014  . Repair umbilical and ventral hernia's w/ mesh  08-21-2009  . Transurethral resection of prostate  AGE 79  . Tonsillectomy and adenoidectomy  as child  . Inguinal hernia repair Right 1980  . Spermatocelectomy Left 12/18/2012    Procedure: LEFT SPERMATOCELECTOMY;  Surgeon: [name], MD;  Location: [facility];  Service:  Urology;  Laterality: Left;  . Prostate biopsy N/A 12/18/2012    Procedure: BIOPSY TRANSRECTAL ULTRASONIC PROSTATE (TUBP);  Surgeon: [name], MD;  Location: [facility];  Service: Urology;  Laterality: N/A;  . Prostate biopsy  12/05/11    gleason 6, 3/12 cores  . Robotic assited partial nephrectomy Right 02/15/2013    Procedure: ROBOTIC ASSITED PARTIAL NEPHRECTOMY;  Surgeon: [name], MD;  Location: [facility];  Service: Urology;  Laterality: Right;  . Robot assisted laparoscopic radical prostatectomy N/A 02/15/2013    Procedure: ROBOTIC ASSISTED LAPAROSCOPIC RADICAL PROSTATECTOMY;  Surgeon: [name], MD;  Location: [facility];  Service: Urology;  Laterality: N/A;  . Lymphadenectomy Bilateral 02/15/2013    Procedure: LYMPHADENECTOMY WITH INDOCYANINE GREEN DYE INJECTION;  Surgeon: [name], MD;  Location: [facility];  Service: Urology;  Laterality: Bilateral;    There were no vitals filed for this visit.      Subjective Assessment - 08/06/15 1404    Subjective Saw the doctor this morning.  She was pleased with what I did for her.  I worked in the yard yesterday, I couldn't have done that before I started here.     Currently in Pain? No/denies   Pain Score 0-No pain            [facility] PT Assessment - 08/06/15 0001    Observation/Other  Assessments   Focus on Therapeutic Outcomes (FOTO)  33% limitation   AROM   Right Shoulder Flexion 121 Degrees   Right Shoulder ABduction 94 Degrees   Left Shoulder Flexion 113 Degrees   Left Shoulder ABduction 76 Degrees   Strength   Overall Strength Comments Bil shoulder 4+/5                      [facility] Adult PT Treatment/Exercise - 08/06/15 0001    Knee/Hip Exercises: Aerobic   Stationary Bike L1 x 6 min   Knee/Hip Exercises: Machines for Strengthening   Cybex Leg Press B 90# 30x; Single leg 45# 20x each  increase weight with B LE   Knee/Hip Exercises: Standing   Other Standing Knee Exercises  sit to stand 20x medium height surface   Shoulder Exercises: Prone   Other Prone Exercises kneeling on table shoulder rows, extension 5# 20x; horizontal abd and scaption 3# 20x each    bilateral   Shoulder Exercises: Power [employer] 20 reps  25#   Other Power Tower Exercises Lat bar 30# 20x in sitting                  PT Short Term Goals - 08/06/15 1418    PT SHORT TERM GOAL #1   Title be independent in initial HEP   Status Achieved   PT SHORT TERM GOAL #2   Title report a 30% reduction in bil. knee pain with yardwork and standing   Status Achieved   PT SHORT TERM GOAL #3   Title report 30% increased ease with getting dressed due to improve shoulder flexibility   Status Achieved   PT SHORT TERM GOAL #4   Title report a 30% reduction in shoulder pain with use   Status Achieved           PT Long Term Goals - 08/06/15 1419    PT LONG TERM GOAL #1   Title be independent in advanced HEP   Status Achieved   PT LONG TERM GOAL #2   Title reduce FOTO to < or = to 37% limitation   Status Achieved   PT LONG TERM GOAL #3   Title report a 60% reduction in bil. knee pain with yardwork and standing   Status Partially Met   PT LONG TERM GOAL #4   Title report 60% increased ease with getting dressed due to improved flexibility   Status Achieved   PT LONG TERM GOAL #5   Title report a 60% reduction in shoulder pain with use with ADLs and self-care   Status Achieved               Plan - 08/06/15 1416    Clinical Impression Statement The patient has met the majority of rehab goals and expresses readiness for discharge to an independent HEP.  Good improvement in FOTO functional outcome score.  He is able to do some yardwork now, stating he couldn't have done that before starting PT.  Improvements in shoulder AROM.  Encouraged patient to joint Silver Sneakers and continue with gym exercises.        Patient will benefit from skilled therapeutic intervention in order  to improve the following deficits and impairments:     Visit Diagnosis: Pain in left knee  Pain in right knee  Pain in left shoulder  Pain in right shoulder  Stiffness of left shoulder, not elsewhere classified  Stiffness of right shoulder, not  elsewhere classified       G-Codes - 2015/08/08 1421    Functional Assessment Tool Used FOTO: 55% limitation   Functional Limitation Other PT primary   Other PT Primary Current Status (S3014) At least 20 percent but less than 40 percent impaired, limited or restricted   Other PT Primary Goal Status (X5973) At least 20 percent but less than 40 percent impaired, limited or restricted      PHYSICAL THERAPY DISCHARGE SUMMARY  Visits from Start of Care: 10  Current functional level related to goals / functional outcomes: Overall progress rated at 50% improvement.  FOTO functional outcome score improved from 55% to 33% limitation.  Good carryover with HEP.  Majority of rehab goals met.     Remaining deficits: Limitations in shoulder ROM secondary to degenerative changes.   Education / Equipment: Comprehensive HEP and basic gym program Plan: Patient agrees to discharge.  Patient goals were met. Patient is being discharged due to meeting the stated rehab goals.  ?????        Problem List Patient Active Problem List   Diagnosis Date Noted  . Generalized osteoarthritis of multiple sites 08/08/2015  . GERD (gastroesophageal reflux disease) 06/25/2015  . Preventative health care 05/21/2014  . Pulmonary nodule 07/05/2013  . Other malaise and fatigue 06/07/2013  . Blood loss anemia 03/18/2013  . Right retroperitoneal urinoma with ureteral leak 03/05/2013  . Pyelonephritis 03/05/2013  . Renal oncocytoma of right kidney 01/23/2013  . Nocturia 01/11/2013  . Left hip pain 03/05/2012  . Hyperglycemia 06/08/2011  . Prostate cancer ([place]) 05/04/2010  . HERNIA, UMBILICAL 31/25/0871  . ERECTILE DYSFUNCTION, ORGANIC 03/10/2009  .  Hyperlipidemia 03/06/2008  . Essential hypertension 03/06/2008   Ruben Im, PT 08/08/15 2:34 PM Phone: [phone] Fax: [phone]   Alvera Singh 08-Aug-2015, 2:30 PM  [facility] [address] [place], [place], [address] Phone: [phone]   Fax:  [phone]  Name: David [name] MRN: [id number] Date of Birth: Dec 29, 1940

## 2015-08-07 ENCOUNTER — Telehealth: Payer: Self-pay | Admitting: *Deleted

## 2015-08-07 NOTE — Telephone Encounter (Signed)
Medication Capsaicin-Menthol-Methyl Sal 0.025-1-12% CREA has been discontinued by manufacturer. Per pharmacist, with Dr. Barbie Banner okay changed medication to Capsaicin HP 0.1% cream 42.5 gram  Tube with 2 refills

## 2015-08-07 NOTE — Telephone Encounter (Signed)
Change of Product! Capsaicin-Menthol-Methyl Sal 0.025-1-12% CREA has been discontinued, please advise [place] [phone]

## 2015-09-08 DIAGNOSIS — D1801 Hemangioma of skin and subcutaneous tissue: Secondary | ICD-10-CM | POA: Diagnosis not present

## 2015-09-08 DIAGNOSIS — L570 Actinic keratosis: Secondary | ICD-10-CM | POA: Diagnosis not present

## 2015-09-08 DIAGNOSIS — L821 Other seborrheic keratosis: Secondary | ICD-10-CM | POA: Diagnosis not present

## 2015-09-08 DIAGNOSIS — L820 Inflamed seborrheic keratosis: Secondary | ICD-10-CM | POA: Diagnosis not present

## 2015-09-08 DIAGNOSIS — L812 Freckles: Secondary | ICD-10-CM | POA: Diagnosis not present

## 2015-09-08 DIAGNOSIS — Z85828 Personal history of other malignant neoplasm of skin: Secondary | ICD-10-CM | POA: Diagnosis not present

## 2015-10-27 ENCOUNTER — Other Ambulatory Visit: Payer: Self-pay | Admitting: Family Medicine

## 2015-10-27 MED ORDER — ATORVASTATIN CALCIUM 10 MG PO TABS: 10.0000 mg | ORAL_TABLET | Freq: Every day | ORAL | 0 refills | Status: DC

## 2015-10-27 NOTE — Telephone Encounter (Signed)
Sent to the pharmacy for 90 days.  Pt has follow up scheduled on 11/18/15.  Also due for lipid (if needed) in Sept.

## 2015-10-29 ENCOUNTER — Other Ambulatory Visit: Payer: Self-pay | Admitting: Internal Medicine

## 2015-11-18 ENCOUNTER — Encounter: Payer: Self-pay | Admitting: Family Medicine

## 2015-11-18 ENCOUNTER — Ambulatory Visit (INDEPENDENT_AMBULATORY_CARE_PROVIDER_SITE_OTHER): Payer: Medicare Other | Admitting: Family Medicine

## 2015-11-18 VITALS — BP 135/80 | HR 67 | Temp 98.4°F | Resp 12 | Ht 72.0 in | Wt 211.1 lb

## 2015-11-18 DIAGNOSIS — I10 Essential (primary) hypertension: Secondary | ICD-10-CM | POA: Diagnosis not present

## 2015-11-18 DIAGNOSIS — K219 Gastro-esophageal reflux disease without esophagitis: Secondary | ICD-10-CM

## 2015-11-18 DIAGNOSIS — M159 Polyosteoarthritis, unspecified: Secondary | ICD-10-CM | POA: Diagnosis not present

## 2015-11-18 DIAGNOSIS — Z23 Encounter for immunization: Secondary | ICD-10-CM | POA: Diagnosis not present

## 2015-11-18 DIAGNOSIS — R059 Cough, unspecified: Secondary | ICD-10-CM

## 2015-11-18 DIAGNOSIS — R05 Cough: Secondary | ICD-10-CM

## 2015-11-18 DIAGNOSIS — J309 Allergic rhinitis, unspecified: Secondary | ICD-10-CM

## 2015-11-18 MED ORDER — FLUTICASONE PROPIONATE 50 MCG/ACT NA SUSP: 2.0000 | Freq: Every day | NASAL | 5 refills | Status: DC

## 2015-11-18 MED ORDER — CETIRIZINE HCL 10 MG PO TABS
10.0000 mg | ORAL_TABLET | Freq: Every day | ORAL | 2 refills | Status: DC
Start: 2015-11-18 — End: 2021-02-23

## 2015-11-18 MED ORDER — DULOXETINE HCL 20 MG PO CPEP: 20.0000 mg | ORAL_CAPSULE | Freq: Every day | ORAL | 1 refills | Status: DC

## 2015-11-18 NOTE — Patient Instructions (Addendum)
A few things to remember from today's visit:   Generalized osteoarthritis of multiple sites - Plan: DULoxetine (CYMBALTA) 20 MG capsule  Essential hypertension  Cough  Allergic rhinitis, unspecified allergic rhinitis type - Plan: cetirizine (ZYRTEC) 10 MG tablet, fluticasone (FLONASE) 50 MCG/ACT nasal spray  Gastroesophageal reflux disease without esophagitis  Could be caused by GERD and allergies among some. If cough is not better in 4-6 weeks please let me know thorough my chart, we may need a chest x-ray.  No changes in blood pressure medication.  Next appointment for Fasting labs.    Avoid foods that make your symptoms worse, for example coffee, chocolate,pepermeint,alcohol, and greasy food. Raising the head of your bed about 6 inches may help with nocturnal symptoms.   Weight loss (if you are overweight). Avoid lying down for 3 hours after eating.  Instead 3 large meals daily try small and more frequent meals during the day.    If symptoms are not resolved sometimes endoscopy is necessary.  Please be sure medication list is accurate. If a new problem present, please set up appointment sooner than planned today.

## 2015-11-18 NOTE — Progress Notes (Signed)
Pre visit review using our clinic review tool, if applicable. No additional management support is needed unless otherwise documented below in the visit note. 

## 2015-11-18 NOTE — Progress Notes (Signed)
HPI:   [name] is a 75 y.o. male, who is here today to follow on some of his chronic medical problems.  History of generalized osteoarthritis, he has been on Cymbalta 20 mg daily for about 5-6 months. He states that he has noted great improvement in arthralgias,shoulders and knees. Occasionally mild non radiated low back pain. Denies saddle anesthesia or bowel/urine incontinence. He completed PT, which also help with arthralgias and ROM.  He still has pain, which is mildly, exacerbated by some activities that involve frequent lifting or kneeling or overhead activities.  No limitation of his daily activities.   He has been having some dizziness, occasionally, which he attributes to Cymbalta. He denies any associated visual changes, headache, or focal weakness. He denies any fall in the past year.   Hypertension: Last OV, Azor 5-20 mg was decreased from 1 tablet to half tablet daily because mild hypotension.  He checks BP at home, reporting readings of < 140/90, usually 130s/70s.  He has not noted unusual headache, visual changes, exertional chest pain, dyspnea,  focal weakness, or edema.  He has history of allergic rhinitis, he has not taking his cetirizine or Flonase for the past few months. Today he is complaining of nonproductive cough for 4-6 weeks, stable. He denies any associated chest pain, dyspnea, wheezing. + Nasal congestion, rhinorrhea,and post nasal drainage. No chills, fever, body aches.  No Hx of recent travel. Sick contact: No No known insect bite.   Medication OTC for this problem: Nasal saline.  He has history of GERD, currently he is on Prilosec 20 mg daily. He has not noted any heartburn or acid reflux.    Review of Systems  Constitutional: Negative for activity change, appetite change, fatigue, fever and unexpected weight change.  HENT: Positive for congestion, postnasal drip, rhinorrhea and sneezing. Negative for facial swelling,  mouth sores, nosebleeds, sinus pressure, sore throat, trouble swallowing and voice change.   Eyes: Negative for pain, redness and visual disturbance.  Respiratory: Positive for cough. Negative for chest tightness, shortness of breath and wheezing.   Cardiovascular: Negative for chest pain, palpitations and leg swelling.  Gastrointestinal: Negative for abdominal pain, nausea and vomiting.       No changes in bowel habits.  Genitourinary: Negative for decreased urine volume and hematuria.  Musculoskeletal: Positive for arthralgias and back pain. Negative for gait problem, joint swelling and myalgias.  Skin: Negative for color change and rash.  Allergic/Immunologic: Positive for environmental allergies.  Neurological: Positive for light-headedness. Negative for syncope, weakness and headaches.  Psychiatric/Behavioral: Positive for sleep disturbance (due to nocturia). Negative for confusion and suicidal ideas. The patient is not nervous/anxious.       Current Outpatient Prescriptions on File Prior to Visit  Medication Sig Dispense Refill  . amLODipine-olmesartan (AZOR) 5-20 MG tablet Take 0.5 tablets by mouth at bedtime. 90 tablet 1  . aspirin EC 81 MG tablet Take 81 mg by mouth daily.    [name] atorvastatin (LIPITOR) 10 MG tablet Take 1 tablet (10 mg total) by mouth daily. 90 tablet 0  . Capsaicin-Menthol-Methyl Sal (CAPSAICIN-METHYL SAL-MENTHOL) 0.025-1-12 % CREA Apply 1 application topically 3 (three) times daily as needed. 160 g 2  . omeprazole (PRILOSEC) 20 MG capsule take 1 capsule by mouth once daily 90 capsule 1  . sodium chloride (OCEAN) 0.65 % nasal spray Place 1 spray into the nose daily as needed for congestion.     No current facility-administered medications on file prior to  visit.      Past Medical History:  Diagnosis Date  . Arthritis    gen.  and left hip  . BPH (benign prostatic hypertrophy)   . GERD (gastroesophageal reflux disease)    occasional  . History of kidney  stones    2007  . Hyperlipidemia   . Hypertension   . Kidney tumor   . LBP (low back pain) 06/2005   L5 radicular symptoms; MRI of LS spine severe spondylosis at L4-L5 with central cancal stenosis   . Nocturia   . Organic impotence   . Prostate cancer ([place]) 10/2011   (Low grade) [facility] - Dr. Junious Silk  . Pyelonephritis 03/05/2013  . Renal oncocytoma of right kidney 01/23/2013   As 3.2 x 3.5 cm enhancing exophytic mass projecting off the lower pole. This is consistent with a renal cell carcinoma. No retroperitoneal lymphadenopathy.   [name] Spermatocele   . 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 Wears glasses    Allergies  Allergen Reactions  . Ace Inhibitors Other (See Comments)    REACTION: Cough  . Codeine     Severe hiccups  . Percocet [Oxycodone-Acetaminophen]     Severe hiccups    Social History   Social History  . Marital status: Married    Spouse name: N/A  . Number of children: N/A  . Years of education: N/A   Social History Main Topics  . Smoking status: Former Smoker    Packs/day: 0.25    Years: 10.00    Types: Cigarettes    Quit date: 02/28/1981  . Smokeless tobacco: Never Used  . Alcohol use 0.0 oz/week     Comment: occasional  . Drug use: No  . Sexual activity: Not Asked   Other Topics Concern  . None   Social History Narrative   Married- 48 yrs   2 daughters   5 grandchildren   Never Smoked   Alcohol use-yes (occasional/social)   Retired- Engineer, water      Physician roster:      Urologist - Dr. [name]   Orthopedics - Dr. [name]   Dermatology - Dr. [name]    Vitals:   11/18/15 1002  BP: 135/80  Pulse: 67  Resp: 12  Temp: 98.4 F (36.9 C)   O2 sat 97% at RA.  Body mass index is 28.63 kg/m.     Physical Exam  Nursing note and vitals reviewed. Constitutional: He is oriented to person, place, and time. He appears well-developed. No distress.  HENT:  Head: Atraumatic.  Nose: Rhinorrhea and septal deviation present. Right sinus exhibits no  maxillary sinus tenderness and no frontal sinus tenderness. Left sinus exhibits no maxillary sinus tenderness and no frontal sinus tenderness.  Mouth/Throat: Oropharynx is clear and moist and mucous membranes are normal.  Nasal voice  Eyes: Conjunctivae and EOM are normal.  Neck: No JVD present. No thyroid mass and no thyromegaly present.  Cardiovascular: Normal rate and regular rhythm.   No murmur heard. Pulses:      Dorsalis pedis pulses are 2+ on the right side, and 2+ on the left side.  Respiratory: Effort normal and breath sounds normal. No respiratory distress.  No cough during OV.  GI: Soft. He exhibits no mass. There is no hepatomegaly. There is no tenderness.  Musculoskeletal: He exhibits no edema or tenderness.  Lymphadenopathy:    He has no cervical adenopathy.  Neurological: He is alert and oriented to person, place, and time. He has normal strength. Coordination normal.  Stable gait with no assistance.  Skin: Skin is warm. No erythema.  Psychiatric: He has a normal mood and affect.  Well groomed, good eye contact.      ASSESSMENT AND PLAN:     Ples was seen today for follow-up.  Diagnoses and all orders for this visit:  Generalized osteoarthritis of multiple sites  He would like to try weaning off Cymbalta, he will resume it if she feels like benefit is greater that risk. Caution with certain activities that he knows aggravate pain and falls precautions discussed.  -     DULoxetine (CYMBALTA) 20 MG capsule; Take 1 capsule (20 mg total) by mouth daily.  Essential hypertension  Adequately controlled. He has plenty of medication, he would like to continue splitting Azor 5-20 mg tab,so no changes in current management. DASH-low salt diet to continue. Eye exam recommended annually, due 2-04/2016. F/U in 5- 6 months, before if needed.  Cough  Possible causes discussed.  Resume antihistaminic and nasal Flonase.  We also may consider increasing PPI.  If not  better in 4-6 weeks CXR will be considered.   Allergic rhinitis, unspecified allergic rhinitis type  Symptomatic. Resume Cetirizine 10 mg daily and Flonase nasal spray, continue nasal saline. Clearly discussed some side effects of medications. Follow-up as needed.   -     cetirizine (ZYRTEC) 10 MG tablet; Take 1 tablet (10 mg total) by mouth daily. -     fluticasone (FLONASE) 50 MCG/ACT nasal spray; Place 2 sprays into both nostrils daily.  Gastroesophageal reflux disease without esophagitis  GERD precautions discussed. No changes in Prilosec dose for now, we may need to consider increasing dose from 20 mg to 40 mg if cough continues.  Need for immunization against influenza -     Flu vaccine HIGH DOSE PF     - Planning on routine annual exam and fasting labs next OV.       -[name] was advised to return sooner than planned today if new concerns arise.       Chanci Ojala G. [name], MD  [facility]. [place] office.

## 2015-12-17 ENCOUNTER — Other Ambulatory Visit: Payer: Self-pay

## 2015-12-17 DIAGNOSIS — I10 Essential (primary) hypertension: Secondary | ICD-10-CM

## 2015-12-17 MED ORDER — AMLODIPINE-OLMESARTAN 5-20 MG PO TABS: 0.5000 | ORAL_TABLET | Freq: Every day | ORAL | 1 refills | Status: DC

## 2015-12-28 ENCOUNTER — Other Ambulatory Visit: Payer: Self-pay | Admitting: Family Medicine

## 2015-12-28 ENCOUNTER — Encounter: Payer: Self-pay | Admitting: Family Medicine

## 2015-12-28 MED ORDER — DULOXETINE HCL 30 MG PO CPEP: 30.0000 mg | ORAL_CAPSULE | Freq: Every day | ORAL | 1 refills | Status: DC

## 2016-01-27 ENCOUNTER — Other Ambulatory Visit: Payer: Self-pay | Admitting: Family Medicine

## 2016-03-03 ENCOUNTER — Other Ambulatory Visit: Payer: Self-pay | Admitting: Family Medicine

## 2016-04-08 DIAGNOSIS — C61 Malignant neoplasm of prostate: Secondary | ICD-10-CM | POA: Diagnosis not present

## 2016-04-19 DIAGNOSIS — N481 Balanitis: Secondary | ICD-10-CM | POA: Diagnosis not present

## 2016-04-19 DIAGNOSIS — C61 Malignant neoplasm of prostate: Secondary | ICD-10-CM | POA: Diagnosis not present

## 2016-04-19 DIAGNOSIS — D3001 Benign neoplasm of right kidney: Secondary | ICD-10-CM | POA: Diagnosis not present

## 2016-05-09 NOTE — Progress Notes (Signed)
HPI:   David [name] is a 76 y.o. male, who is here today to follow on some chronic medical problems.  Last seen on 11/18/15.  Generalized OA:  He is on Cymbalta 30 mg daily, which has helped greatly with joint pain. He now takes Aleve once per week for pain instead daily.  Last OV he wanted to discontinued Cymbalta because it was causing dizziness, a weeks after he started weaning medication off he started with "bad" joint pain. Shoulders,knees,wrists, and occasionally lower and upper back. Hx of radiculopathy (L5) ,denies any radiation of pain to lower extremities. He ahs received epidural injections in the past. He denies saddle anesthesia or changes in bladder or fecal continence. In general pain 2-3/10, achy like pain,intermittent.  Pain exacerbated by certain activities: prolonged walking or standing, upon getting up after prolonged sitting. Alleviated by rest.   In regard to mood, he states that he feels "calmer", denies depressed mood or suicidal thoughts.   -He has had dry mouth and wonders if it is related to Cymbalta.  + Nasal congestion and rhinitis due to allergies.He is also on Zyrtec 10 mg.   Hypertension:   Currently on Azor 5-20 mg 1/2 tab daily  BP home 110's/70's. He is having difficulty breaking tab.  He is taking medications as instructed, no side effects reported.  He has not noted unusual headache, visual changes, exertional chest pain, dyspnea,  focal weakness, or edema.   Lab Results  Component Value Date   CREATININE 0.88 06/29/2015   BUN 21 06/29/2015   NA 135 06/29/2015   K 4.2 06/29/2015   CL 106 06/29/2015   CO2 21 06/29/2015    GERD: Last OV he was c/o cough, it has not changed. Cough seems to be worse at night. He has not noted associated wheezing.  He is on Prilosec 20 mg daily.   Denies abdominal pain, nausea, vomiting, changes in bowel habits, blood in stool or melena.  -He also mention some exertional  dyspnea when walking back from mail box, a "little" incline ground. No associated chest pain,palpitation,diaphoresis,wheezing,or dizziness. This has been going on for a while.  + Former smoker. Brother and father with COPD.  He had a chest CT 05/2014 to follow on the one done 05/2013. No change in a 0.5 cm right lower lobe pulmonary nodule. Follow-up CT scan 18-24 months to ensure stability is recommended.    Hyperlipidemia:  Currently on Atorvastatin 10 mg daily. Following a low fat diet: Yes.  He has not noted side effects with medication.  Lab Results  Component Value Date   CHOL 175 11/17/2014   HDL 40.40 11/17/2014   LDLCALC 108 (H) 11/17/2014   LDLDIRECT 122.1 03/05/2012   TRIG 136.0 11/17/2014   CHOLHDL 4 11/17/2014    He has been eating healthier,has noted wt loss.    Review of Systems  Constitutional: Negative for activity change, appetite change, fatigue, fever and unexpected weight change.  HENT: Positive for congestion and rhinorrhea. Negative for nosebleeds, sore throat, trouble swallowing and voice change.   Eyes: Negative for redness and visual disturbance.  Respiratory: Positive for cough and shortness of breath. Negative for apnea and wheezing.   Cardiovascular: Negative for chest pain, palpitations and leg swelling.  Gastrointestinal: Negative for abdominal pain, nausea and vomiting.       No changes in bowel habits.  Genitourinary: Negative for decreased urine volume and hematuria.  Musculoskeletal: Positive for arthralgias and back pain. Negative for  gait problem and joint swelling.  Skin: Negative for rash.  Allergic/Immunologic: Positive for environmental allergies.  Neurological: Negative for dizziness, syncope, weakness and headaches.  Psychiatric/Behavioral: Negative for confusion and sleep disturbance. The patient is not nervous/anxious.       Current Outpatient Prescriptions on File Prior to Visit  Medication Sig Dispense Refill  . aspirin  EC 81 MG tablet Take 81 mg by mouth daily.    [name] atorvastatin (LIPITOR) 10 MG tablet take 1 tablet by mouth once daily 90 tablet 1  . Capsaicin-Menthol-Methyl Sal (CAPSAICIN-METHYL SAL-MENTHOL) 0.025-1-12 % CREA Apply 1 application topically 3 (three) times daily as needed. 160 g 2  . cetirizine (ZYRTEC) 10 MG tablet Take 1 tablet (10 mg total) by mouth daily. 90 tablet 2  . fluticasone (FLONASE) 50 MCG/ACT nasal spray Place 2 sprays into both nostrils daily. 16 g 5  . sodium chloride (OCEAN) 0.65 % nasal spray Place 1 spray into the nose daily as needed for congestion.     No current facility-administered medications on file prior to visit.      Past Medical History:  Diagnosis Date  . Arthritis    gen.  and left hip  . BPH (benign prostatic hypertrophy)   . GERD (gastroesophageal reflux disease)    occasional  . History of kidney stones    2007  . Hyperlipidemia   . Hypertension   . Kidney tumor   . LBP (low back pain) 06/2005   L5 radicular symptoms; MRI of LS spine severe spondylosis at L4-L5 with central cancal stenosis   . Nocturia   . Organic impotence   . Prostate cancer ([place]) 10/2011   (Low grade) [facility] - Dr. Junious Silk  . Pyelonephritis 03/05/2013  . Renal oncocytoma of right kidney 01/23/2013   As 3.2 x 3.5 cm enhancing exophytic mass projecting off the lower pole. This is consistent with a renal cell carcinoma. No retroperitoneal lymphadenopathy.   [name] Spermatocele   . 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 Wears glasses    Allergies  Allergen Reactions  . Ace Inhibitors Other (See Comments)    REACTION: Cough  . Codeine     Severe hiccups  . Percocet [Oxycodone-Acetaminophen]     Severe hiccups    Social History   Social History  . Marital status: Married    Spouse name: N/A  . Number of children: N/A  . Years of education: N/A   Social History Main Topics  . Smoking status: Former Smoker    Packs/day: 0.25    Years: 10.00    Types: Cigarettes    Quit date:  02/28/1981  . Smokeless tobacco: Never Used  . Alcohol use 0.0 oz/week     Comment: occasional  . Drug use: No  . Sexual activity: Not Asked   Other Topics Concern  . None   Social History Narrative   Married- 32 yrs   2 daughters   5 grandchildren   Never Smoked   Alcohol use-yes (occasional/social)   Retired- Engineer, water      Physician roster:      Urologist - Dr. [name]   Orthopedics - Dr. [name]   Dermatology - Dr. [name]    Vitals:   05/10/16 1434  BP: 122/62  Pulse: 70  Resp: 12  O2 sat at RA 98% Body mass index is 27.43 kg/m.   Wt Readings from Last 3 Encounters:  05/10/16 202 lb 4 oz (91.7 kg)  11/18/15 211 lb 2 oz (95.8 kg)  08/06/15 211 lb (95.7 kg)    Physical Exam  Nursing note and vitals reviewed. Constitutional: He is oriented to person, place, and time. He appears well-developed and well-nourished. No distress.  HENT:  Head: Atraumatic.  Mouth/Throat: Oropharynx is clear and moist and mucous membranes are normal.  Eyes: Conjunctivae and EOM are normal. Pupils are equal, round, and reactive to light.  Neck: No tracheal deviation present. No thyroid mass and no thyromegaly present.  Cardiovascular: Normal rate and regular rhythm.   No murmur heard. Pulses:      Dorsalis pedis pulses are 2+ on the right side, and 2+ on the left side.  Respiratory: Effort normal and breath sounds normal. No respiratory distress.  GI: Soft. He exhibits no mass. There is no hepatomegaly. There is no tenderness.  Musculoskeletal: He exhibits no edema or tenderness.  Cervical ROM limited, no pain elicited. Shoulders with mild active limitation abduction L>R,no pain elicited. Knee crepitus bilateral. No signs of synovitis.   Lymphadenopathy:    He has no cervical adenopathy.  Neurological: He is alert and oriented to person, place, and time. He has normal strength. Coordination and gait normal.  Skin: Skin is warm. No erythema.  Psychiatric: He has a normal  mood and affect.  Well groomed, good eye contact.      ASSESSMENT AND PLAN:   Omarion was seen today for follow-up.  Diagnoses and all orders for this visit:  Exertional dyspnea  We discussed possible etiologies. ? Of COPD. He agrees with trying Symbicort twice daily. Instructed about warning signs.   Follow-up in 3 months, before if needed.  -     budesonide-formoterol (SYMBICORT) 80-4.5 MCG/ACT inhaler; Inhale 2 puffs into the lungs 2 (two) times daily.  Generalized osteoarthritis of multiple sites  In general pain seems to be well controlled. No changes in Cymbalta. Some side effects of NSAIDs discussed.  Follow-up in 5-6 months.  -     DULoxetine (CYMBALTA) 30 MG capsule; Take 1 capsule (30 mg total) by mouth daily.  Essential hypertension  Well controlled. Because having difficulty splitting tab, he agrees with stopping amlodipine. He will continue Olmesartan,dose increased from 10 mg to 20 mg. Continue Monitoring BP. BP check in 6 weeks. Continue low-salt DASH diet  -     olmesartan (BENICAR) 20 MG tablet; Take 1 tablet (20 mg total) by mouth daily. -     Comprehensive metabolic panel; Future  Gastroesophageal reflux disease without esophagitis  Stable.This problem can be causing/contributing to cough. No changes in current management for now. F/U in 6-12 months.  -     omeprazole (PRILOSEC) 20 MG capsule; Take 1 capsule (20 mg total) by mouth daily. Before breakfast  Pulmonary nodule  F/U chest CT will be arranged.  -     CT Chest W Contrast; Future  Persistent cough  Possible causes discussed: GERD, allergies, COPD among some.  Hyperlipidemia, unspecified hyperlipidemia type  No changes in current management, will follow labs and will give further recommendations accordingly. Continue low-fat diet Follow-up in 12 months.  Allergic rhinitis, unspecified chronicity, unspecified seasonality, unspecified trigger  Dry mouth might also be  caused/aggravated by nasal congestion and mouth breathing at night. He can use Flonase at bedtime. Also nasal strips may help.   -He is not fasting today,so labs to be done during BP check in 6 weeks.   2:37 pm to 3:24 pm face to face OV.  > 50% of time was dedicated to reviewed imaging,discussion of prognosis of medical  problems,possible complications, treatment options for HTN and side effects of medications. Pulmonary nodules f/u recommendations and plan of care.    -Mr. Markham Kateryna Grantham was advised to return sooner than planned today if new concerns arise.       Sharad Vaneaton G. [name], MD  [facility] [place] office.

## 2016-05-10 ENCOUNTER — Ambulatory Visit: Payer: Medicare Other | Admitting: Family Medicine

## 2016-05-10 ENCOUNTER — Encounter: Payer: Self-pay | Admitting: Family Medicine

## 2016-05-10 ENCOUNTER — Ambulatory Visit (INDEPENDENT_AMBULATORY_CARE_PROVIDER_SITE_OTHER): Payer: Medicare Other | Admitting: Family Medicine

## 2016-05-10 ENCOUNTER — Other Ambulatory Visit: Payer: Self-pay

## 2016-05-10 VITALS — BP 122/62 | HR 70 | Resp 12 | Ht 72.0 in | Wt 202.2 lb

## 2016-05-10 DIAGNOSIS — R05 Cough: Secondary | ICD-10-CM

## 2016-05-10 DIAGNOSIS — I10 Essential (primary) hypertension: Secondary | ICD-10-CM

## 2016-05-10 DIAGNOSIS — J309 Allergic rhinitis, unspecified: Secondary | ICD-10-CM

## 2016-05-10 DIAGNOSIS — R911 Solitary pulmonary nodule: Secondary | ICD-10-CM

## 2016-05-10 DIAGNOSIS — K219 Gastro-esophageal reflux disease without esophagitis: Secondary | ICD-10-CM

## 2016-05-10 DIAGNOSIS — R053 Chronic cough: Secondary | ICD-10-CM

## 2016-05-10 DIAGNOSIS — E785 Hyperlipidemia, unspecified: Secondary | ICD-10-CM

## 2016-05-10 DIAGNOSIS — R0609 Other forms of dyspnea: Secondary | ICD-10-CM | POA: Diagnosis not present

## 2016-05-10 DIAGNOSIS — M159 Polyosteoarthritis, unspecified: Secondary | ICD-10-CM

## 2016-05-10 DIAGNOSIS — E7849 Other hyperlipidemia: Secondary | ICD-10-CM

## 2016-05-10 MED ORDER — OLMESARTAN MEDOXOMIL 20 MG PO TABS: 20.0000 mg | ORAL_TABLET | Freq: Every day | ORAL | 1 refills | Status: DC

## 2016-05-10 MED ORDER — OMEPRAZOLE 20 MG PO CPDR: 20.0000 mg | DELAYED_RELEASE_CAPSULE | Freq: Every day | ORAL | 1 refills | Status: DC

## 2016-05-10 MED ORDER — BUDESONIDE-FORMOTEROL FUMARATE 80-4.5 MCG/ACT IN AERO: 2.0000 | INHALATION_SPRAY | Freq: Two times a day (BID) | RESPIRATORY_TRACT | 3 refills | Status: DC

## 2016-05-10 MED ORDER — DULOXETINE HCL 30 MG PO CPEP: 30.0000 mg | ORAL_CAPSULE | Freq: Every day | ORAL | 2 refills | Status: DC

## 2016-05-10 NOTE — Progress Notes (Signed)
Pre visit review using our clinic review tool, if applicable. No additional management support is needed unless otherwise documented below in the visit note. 

## 2016-05-10 NOTE — Patient Instructions (Signed)
A few things to remember from today's visit:   Essential hypertension - Plan: Comprehensive metabolic panel, olmesartan (BENICAR) 20 MG tablet  Generalized osteoarthritis of multiple sites - Plan: DULoxetine (CYMBALTA) 30 MG capsule  Exertional dyspnea - Plan: budesonide-formoterol (SYMBICORT) 80-4.5 MCG/ACT inhaler  CT will be arranged. BP med changed to Benicar.   Please be sure medication list is accurate. If a new problem present, please set up appointment sooner than planned today.

## 2016-06-10 ENCOUNTER — Other Ambulatory Visit (INDEPENDENT_AMBULATORY_CARE_PROVIDER_SITE_OTHER): Payer: Medicare Other

## 2016-06-10 DIAGNOSIS — I10 Essential (primary) hypertension: Secondary | ICD-10-CM

## 2016-06-10 LAB — COMPREHENSIVE METABOLIC PANEL
ALBUMIN: 3.9 g/dL (ref 3.5–5.2)
ALK PHOS: 111 U/L (ref 39–117)
ALT: 23 U/L (ref 0–53)
AST: 22 U/L (ref 0–37)
BILIRUBIN TOTAL: 0.6 mg/dL (ref 0.2–1.2)
BUN: 20 mg/dL (ref 6–23)
CALCIUM: 9.9 mg/dL (ref 8.4–10.5)
CO2: 25 mEq/L (ref 19–32)
Chloride: 109 mEq/L (ref 96–112)
Creatinine, Ser: 1.08 mg/dL (ref 0.40–1.50)
GFR: 70.81 mL/min (ref >60.00)
Glucose, Bld: 124 mg/dL — ABNORMAL HIGH (ref 70–99)
POTASSIUM: 3.9 meq/L (ref 3.5–5.1)
Sodium: 140 mEq/L (ref 135–145)
TOTAL PROTEIN: 6.7 g/dL (ref 6.0–8.3)

## 2016-06-12 ENCOUNTER — Encounter: Payer: Self-pay | Admitting: Family Medicine

## 2016-06-13 ENCOUNTER — Ambulatory Visit (INDEPENDENT_AMBULATORY_CARE_PROVIDER_SITE_OTHER)
Admission: RE | Admit: 2016-06-13 | Discharge: 2016-06-13 | Disposition: A | Discharge status: Home / Self Care | Payer: Medicare Other | Source: Ambulatory Visit | Attending: Family Medicine | Admitting: Family Medicine

## 2016-06-13 DIAGNOSIS — R911 Solitary pulmonary nodule: Secondary | ICD-10-CM

## 2016-06-13 MED ORDER — IOPAMIDOL (ISOVUE-300) INJECTION 61%
80.0000 mL | Freq: Once | INTRAVENOUS | Status: AC | PRN
  Administered 2016-06-13: 80 mL via INTRAVENOUS

## 2016-06-20 ENCOUNTER — Encounter: Payer: Self-pay | Admitting: Family Medicine

## 2016-06-21 ENCOUNTER — Ambulatory Visit (INDEPENDENT_AMBULATORY_CARE_PROVIDER_SITE_OTHER): Payer: Medicare Other

## 2016-06-21 ENCOUNTER — Other Ambulatory Visit (INDEPENDENT_AMBULATORY_CARE_PROVIDER_SITE_OTHER): Payer: Medicare Other

## 2016-06-21 VITALS — BP 134/70

## 2016-06-21 DIAGNOSIS — I10 Essential (primary) hypertension: Secondary | ICD-10-CM | POA: Diagnosis not present

## 2016-06-21 DIAGNOSIS — E784 Other hyperlipidemia: Secondary | ICD-10-CM | POA: Diagnosis not present

## 2016-06-21 DIAGNOSIS — E7849 Other hyperlipidemia: Secondary | ICD-10-CM

## 2016-06-21 LAB — LIPID PANEL
Cholesterol: 173 mg/dL (ref 0–200)
HDL: 43 mg/dL (ref >39.00)
LDL Cholesterol: 107 mg/dL — ABNORMAL HIGH (ref 0–99)
NonHDL: 130.32
TRIGLYCERIDES: 119 mg/dL (ref 0.0–149.0)
Total CHOL/HDL Ratio: 4
VLDL: 23.8 mg/dL (ref 0.0–40.0)

## 2016-06-21 NOTE — Progress Notes (Addendum)
Patient came in to office to have blood pressure checked. He stopped the Benicar and restarted Azor this past Friday. He feels this better controls his blood pressure. He has been monitoring his blood pressure at home and it was increasing on the Benicar so he decided to change back to Azor. He does have cardiology consultation scheduled.    I have reviewed documentation from this visit and I agree with recommendations given.  [name] G. [name], MD  [facility]. [place] office.

## 2016-06-23 ENCOUNTER — Encounter: Payer: Self-pay | Admitting: Family Medicine

## 2016-06-28 NOTE — Progress Notes (Signed)
David [name], David So, MD Reason for referral-Dyspnea  HPI: 76 year old male for evaluation of dyspnea on exertion. Abdominal CT December 2015 showed no aneurysm but there was note of coronary calcification. Chest CT April 2018 showed a 5 mm right lower lobe pulmonary nodule unchanged and no acute cardiopulmonary disease. Patient has noticed gradual increased dyspnea on exertion with more vigorous activities. No orthopnea, PND, pedal edema, chest pain or syncope. Because of the above we were asked to evaluate.  Current Outpatient Prescriptions  Medication Sig Dispense Refill  . Amlodipine-Olmesartan (AZOR PO) Take 10 mg by mouth daily.    [name] aspirin EC 81 MG tablet Take 81 mg by mouth daily.    [name] atorvastatin (LIPITOR) 10 MG tablet take 1 tablet by mouth once daily 90 tablet 1  . budesonide-formoterol (SYMBICORT) 80-4.5 MCG/ACT inhaler Inhale 2 puffs into the lungs 2 (two) times daily. (Patient taking differently: Inhale 2 puffs into the lungs daily as needed. ) 1 Inhaler 3  . cetirizine (ZYRTEC) 10 MG tablet Take 1 tablet (10 mg total) by mouth daily. 90 tablet 2  . DULoxetine (CYMBALTA) 30 MG capsule Take 1 capsule (30 mg total) by mouth daily. 90 capsule 2  . fluticasone (FLONASE) 50 MCG/ACT nasal spray Place 2 sprays into both nostrils daily. (Patient taking differently: Place 2 sprays into both nostrils daily as needed. ) 16 g 5  . omeprazole (PRILOSEC) 20 MG capsule Take 1 capsule (20 mg total) by mouth daily. Before breakfast 90 capsule 1  . sodium chloride (OCEAN) 0.65 % nasal spray Place 1 spray into the nose daily as needed for congestion.     No current facility-administered medications for this visit.     Allergies  Allergen Reactions  . Ace Inhibitors Other (See Comments)    REACTION: Cough  . Codeine     Severe hiccups  . Percocet [Oxycodone-Acetaminophen]     Severe hiccups     Past Medical History:  Diagnosis Date  . Arthritis    gen.  and left hip  . BPH  (benign prostatic hypertrophy)   . GERD (gastroesophageal reflux disease)    occasional  . History of kidney stones    2007  . Hyperlipidemia   . Hypertension   . Kidney tumor   . LBP (low back pain) 06/2005   L5 radicular symptoms; MRI of LS spine severe spondylosis at L4-L5 with central cancal stenosis   . Nocturia   . Organic impotence   . Prostate cancer ([name]) 10/2011   (Low grade) [facility] - Dr. Junious Silk  . Pyelonephritis 03/05/2013  . Renal oncocytoma of right kidney 01/23/2013   As 3.2 x 3.5 cm enhancing exophytic mass projecting off the lower pole. This is consistent with a renal cell carcinoma. No retroperitoneal lymphadenopathy.   [name] Spermatocele   . 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 Wears glasses     Past Surgical History:  Procedure Laterality Date  . COLONOSCOPY  02/15/2006, 2014   Internal hemorrhoids (Dr [name]), last in 2014  . INGUINAL HERNIA REPAIR Right 1980  . LYMPHADENECTOMY Bilateral 02/15/2013   Procedure: LYMPHADENECTOMY WITH INDOCYANINE GREEN DYE INJECTION;  Surgeon: [name], MD;  Location: [facility];  Service: Urology;  Laterality: Bilateral;  . PROSTATE BIOPSY N/A 12/18/2012   Procedure: BIOPSY TRANSRECTAL ULTRASONIC PROSTATE (TUBP);  Surgeon: [name], MD;  [facility];  Service: Urology;  Laterality: N/A;  . PROSTATE BIOPSY  12/05/11   gleason 6, 3/12 cores  . REPAIR  UMBILICAL AND VENTRAL HERNIA'S W/ MESH  08-21-2009  . ROBOT ASSISTED LAPAROSCOPIC RADICAL PROSTATECTOMY N/A 02/15/2013   Procedure: ROBOTIC ASSISTED LAPAROSCOPIC RADICAL PROSTATECTOMY;  Surgeon: [name], MD;  Location: [facility];  Service: Urology;  Laterality: N/A;  . ROBOTIC ASSITED PARTIAL NEPHRECTOMY Right 02/15/2013   Procedure: ROBOTIC ASSITED PARTIAL NEPHRECTOMY;  Surgeon: [name], MD;  Location: [facility];  Service: Urology;  Laterality: Right;  . SPERMATOCELECTOMY Left 12/18/2012   Procedure: LEFT SPERMATOCELECTOMY;  Surgeon: [name], MD;  Location: [facility];  Service: Urology;  Laterality: Left;  . TONSILLECTOMY AND ADENOIDECTOMY  as child  . TRANSURETHRAL RESECTION OF PROSTATE  AGE 80    Social History   Social History  . Marital status: Married    Spouse name: N/A  . Number of children: 2  . Years of education: N/A   Occupational History  . Not on file.   Social History Main Topics  . Smoking status: Former Smoker    Packs/day: 0.25    Years: 10.00    Types: Cigarettes    Quit date: 02/28/1981  . Smokeless tobacco: Never Used  . Alcohol use 0.0 oz/week     Comment: occasional  . Drug use: No  . Sexual activity: Not on file   Other Topics Concern  . Not on file   Social History Narrative   Married- 66 yrs   2 daughters   5 grandchildren   Never Smoked   Alcohol use-yes (occasional/social)   Retired- Engineer, water      Physician roster:      Urologist - Dr. [name]   Orthopedics - Dr. [name]   Dermatology - Dr. [name]    Family History  Problem Relation Age of Onset  . Cancer Mother     colon  . Colon cancer Mother 15  . Pulmonary fibrosis Father     father owned a Radio broadcast assistant (father blamed inhalation of chemicals and fumes)  . Pulmonary fibrosis Brother   . Coronary artery disease Other   . Hyperlipidemia Other     ROS: Arthralgias but no fevers or chills, productive cough, hemoptysis, dysphasia, odynophagia, melena, hematochezia, dysuria, hematuria, rash, seizure activity, orthopnea, PND, pedal edema, claudication. Remaining systems are negative.  Physical Exam:   Blood pressure (!) 148/70, pulse (!) 56, height 6' (1.829 m), weight 198 lb (89.8 kg).  General:  Well developed/well nourished in NAD Skin warm/dry Patient not depressed No peripheral clubbing Back-normal HEENT-normal/normal eyelids Neck supple/normal carotid upstroke bilaterally; no bruits; no JVD; no thyromegaly chest - CTA/ normal expansion CV - RRR/normal S1 and S2;  no murmurs, rubs or gallops;  PMI nondisplaced Abdomen -NT/ND, no HSM, no mass, + bowel sounds, no bruit, umbilical hernia 2+ femoral pulses, no bruits Ext-no edema, chords, 2+ DP Neuro-grossly nonfocal  ECG - Sinus bradycardia with occasional PAC, IVCD, no ST changes. personally reviewed  A/P  1 dyspnea-etiology unclear. He does have a family history of pulmonary fibrosis but this was not evident on his CT scan. He is noted to have coronary calcification on his CT. I will arrange an exercise treadmill for risk stratification. Echocardiogram to assess LV function.   2 hypertension-blood pressure is mildly elevated. We will increase his azor the 5/20 mg daily and follow.   3 hyperlipidemia-recent LDL greater than 100 and he is noted to have coronary calcium on his CT scan. Increase Lipitor to 40 mg daily. Check lipids and liver in 4 weeks.  Aaron Edelman  Stanford Breed, MD

## 2016-06-29 ENCOUNTER — Other Ambulatory Visit: Payer: Self-pay | Admitting: Family Medicine

## 2016-07-05 ENCOUNTER — Encounter: Payer: Self-pay | Admitting: Cardiology

## 2016-07-05 ENCOUNTER — Ambulatory Visit (INDEPENDENT_AMBULATORY_CARE_PROVIDER_SITE_OTHER): Payer: Medicare Other | Admitting: Cardiology

## 2016-07-05 VITALS — BP 148/70 | HR 56 | Ht 72.0 in | Wt 198.0 lb

## 2016-07-05 DIAGNOSIS — E7800 Pure hypercholesterolemia, unspecified: Secondary | ICD-10-CM

## 2016-07-05 DIAGNOSIS — R0600 Dyspnea, unspecified: Secondary | ICD-10-CM | POA: Diagnosis not present

## 2016-07-05 DIAGNOSIS — I10 Essential (primary) hypertension: Secondary | ICD-10-CM | POA: Diagnosis not present

## 2016-07-05 MED ORDER — AMLODIPINE-OLMESARTAN 5-20 MG PO TABS: 1.0000 | ORAL_TABLET | Freq: Every day | ORAL | 3 refills | Status: DC

## 2016-07-05 MED ORDER — ATORVASTATIN CALCIUM 40 MG PO TABS: 40.0000 mg | ORAL_TABLET | Freq: Every day | ORAL | 3 refills | Status: DC

## 2016-07-05 NOTE — Patient Instructions (Addendum)
Medication Instructions:   INCREASE AZOR TO 5-20 MG MG ONCE DAILY  INCREASE ATORVASTATIN TO 40 MG ONCE DAILY= 4 OF THE 10 MG TABLETS ONCE DAILY  Labwork:  Your physician recommends that you return for lab work in: 4 WEEKS=[facility]  Testing/Procedures:  Your physician has requested that you have an echocardiogram. Echocardiography is a painless test that uses sound waves to create images of your heart. It provides your doctor with information about the size and shape of your heart and how well your heart's chambers and valves are working. This procedure takes approximately one hour. There are no restrictions for this procedure.   Your physician has requested that you have an exercise tolerance test. For further information please visit [email or web address]. Please also follow instruction sheet, as given.    Follow-Up:  Your physician wants you to follow-up in: [place] will receive a reminder letter in the mail two months in advance. If you don't receive a letter, please call our office to schedule the follow-up appointment.   If you need a refill on your cardiac medications before your next appointment, please call your pharmacy.   Exercise Stress Electrocardiogram An exercise stress electrocardiogram is a test to check how blood flows to your heart. It is done to find areas of poor blood flow. You will need to walk on a treadmill for this test. The electrocardiogram will record your heartbeat when you are at rest and when you are exercising. What happens before the procedure?  Do not have drinks with caffeine or foods with caffeine for 24 hours before the test, or as told by your doctor. This includes coffee, tea (even decaf tea), sodas, chocolate, and cocoa.  Follow your doctor's instructions about eating and drinking before the test.  Ask your doctor what medicines you should or should not take before the test. Take your medicines with water unless told by your  doctor not to.  If you use an inhaler, bring it with you to the test.  Bring a snack to eat after the test.  Do not  smoke for 4 hours before the test.  Do not put lotions, powders, creams, or oils on your chest before the test.  Wear comfortable shoes and clothing. What happens during the procedure?  You will have patches put on your chest. Small areas of your chest may need to be shaved. Wires will be connected to the patches.  Your heart rate will be watched while you are resting and while you are exercising.  You will walk on the treadmill. The treadmill will slowly get faster to raise your heart rate.  The test will take about 1-2 hours. What happens after the procedure?  Your heart rate and blood pressure will be watched after the test.  You may return to your normal diet, activities, and medicines or as told by your doctor. This information is not intended to replace advice given to you by your health care provider. Make sure you discuss any questions you have with your health care provider. Document Released: 08/03/2007 Document Revised: 10/14/2015 Document Reviewed: 10/22/2012 Elsevier Interactive Patient Education  2017 [employer].

## 2016-07-19 ENCOUNTER — Ambulatory Visit (HOSPITAL_COMMUNITY): Payer: Medicare Other | Attending: Cardiovascular Disease

## 2016-07-19 ENCOUNTER — Other Ambulatory Visit: Payer: Self-pay

## 2016-07-19 DIAGNOSIS — R0600 Dyspnea, unspecified: Secondary | ICD-10-CM

## 2016-07-19 DIAGNOSIS — C61 Malignant neoplasm of prostate: Secondary | ICD-10-CM | POA: Insufficient documentation

## 2016-07-19 DIAGNOSIS — E785 Hyperlipidemia, unspecified: Secondary | ICD-10-CM | POA: Diagnosis not present

## 2016-07-19 DIAGNOSIS — Z8249 Family history of ischemic heart disease and other diseases of the circulatory system: Secondary | ICD-10-CM | POA: Diagnosis not present

## 2016-07-19 DIAGNOSIS — I10 Essential (primary) hypertension: Secondary | ICD-10-CM | POA: Insufficient documentation

## 2016-07-19 DIAGNOSIS — Z87891 Personal history of nicotine dependence: Secondary | ICD-10-CM | POA: Diagnosis not present

## 2016-07-21 ENCOUNTER — Telehealth (HOSPITAL_COMMUNITY): Payer: Self-pay

## 2016-07-21 NOTE — Telephone Encounter (Signed)
Encounter complete. 

## 2016-07-22 ENCOUNTER — Other Ambulatory Visit: Payer: Self-pay | Admitting: Family Medicine

## 2016-07-26 ENCOUNTER — Ambulatory Visit (HOSPITAL_COMMUNITY)
Admission: RE | Admit: 2016-07-26 | Discharge: 2016-07-26 | Disposition: A | Discharge status: Home / Self Care | Payer: Medicare Other | Source: Ambulatory Visit | Attending: Cardiovascular Disease | Admitting: Cardiovascular Disease

## 2016-07-26 DIAGNOSIS — R0600 Dyspnea, unspecified: Secondary | ICD-10-CM

## 2016-07-26 LAB — EXERCISE TOLERANCE TEST
CHL RATE OF PERCEIVED EXERTION: 18
CSEPED: 6 min
CSEPEDS: 15 s
CSEPHR: 103 %
Estimated workload: 7.3 METS
MPHR: 145 {beats}/min
Peak HR: 150 {beats}/min
Rest HR: 65 {beats}/min

## 2016-08-02 DIAGNOSIS — E7800 Pure hypercholesterolemia, unspecified: Secondary | ICD-10-CM | POA: Diagnosis not present

## 2016-08-03 LAB — HEPATIC FUNCTION PANEL
ALBUMIN: 4.3 g/dL (ref 3.5–4.8)
ALK PHOS: 144 IU/L — AB (ref 39–117)
ALT: 25 IU/L (ref 0–44)
AST: 21 IU/L (ref 0–40)
BILIRUBIN TOTAL: 0.4 mg/dL (ref 0.0–1.2)
BILIRUBIN, DIRECT: 0.12 mg/dL (ref 0.00–0.40)
Total Protein: 6.8 g/dL (ref 6.0–8.5)

## 2016-08-03 LAB — LIPID PANEL
CHOLESTEROL TOTAL: 155 mg/dL (ref 100–199)
Chol/HDL Ratio: 3.5 ratio (ref 0.0–5.0)
HDL: 44 mg/dL (ref >39)
LDL Calculated: 94 mg/dL (ref 0–99)
Triglycerides: 86 mg/dL (ref 0–149)
VLDL CHOLESTEROL CAL: 17 mg/dL (ref 5–40)

## 2016-08-04 ENCOUNTER — Encounter: Payer: Self-pay | Admitting: Cardiology

## 2016-08-04 ENCOUNTER — Other Ambulatory Visit: Payer: Self-pay | Admitting: *Deleted

## 2016-08-04 DIAGNOSIS — R748 Abnormal levels of other serum enzymes: Secondary | ICD-10-CM

## 2016-08-04 NOTE — Telephone Encounter (Signed)
Mr.Laube is returning a call about test results . Please call

## 2016-08-04 NOTE — Telephone Encounter (Signed)
This encounter was created in error - please disregard.

## 2016-09-07 DIAGNOSIS — H04123 Dry eye syndrome of bilateral lacrimal glands: Secondary | ICD-10-CM | POA: Diagnosis not present

## 2016-09-07 DIAGNOSIS — H26491 Other secondary cataract, right eye: Secondary | ICD-10-CM | POA: Diagnosis not present

## 2016-09-08 NOTE — Progress Notes (Signed)
HPI:   David [name] is a 76 y.o. male, who is here today to follow on some chronic medical problems.  He was last seen on 05/10/16 and at that time he was c/o exertional dyspnea. Since his last OV he has followed with cardiologists, Dr Stanford Breed.  Symbicort 80-4.5 mcg bid was recommended, he did not notice significant difference but states that he is feeling much better, no longer having dyspnea when walking or with most activities. He states that he gets "a little tired" when weeding grass for over 30 min, no wheezing or chest pain associated. He thinks wt loss has helped, trying to eat healthier.   Chest CT was also ordered to follow on pulmonary nodule. 1. No acute cardiopulmonary disease. 2. 5 mm right lower lobe pulmonary nodule unchanged compared with 06/13/2013. No further evaluation recommended.  HTN:  He went back to Azor 5-20 mg 1 tab daily.  Denies headache, visual changes, chest pain, dyspnea, palpitation, claudication, focal weakness, or edema.  BP readings: 120-128/low 70's.  He also mentions elevated alkaline phosphatase recently when LFT's was done after increasing Lipitor dose. He denies alcohol abuse. According to pt, he already has labs scheduled for 10/2016 to re-check numbers. Hx of prostate cancer.Nocturia, 2-3 times per night,stable. Denies dysuria,increased urinary frequency, gross hematuria,or decreased urine output. He is following with urologists. Hx of lower back pain and OA but pain has been well controlled since stated on Cymbalta.    Still having non productive cough, mainly at night. Hx of GERD, he is on Omeprazole 20 mg  In the morning.   Noted GLU 124 in 05/2016, he is not sure if lab was fasting.  Lab Results  Component Value Date   HGBA1C 5.7 09/09/2016   He is tolerating all his medications well.  He would like to get the new zoster vaccine.  Review of Systems  Constitutional: Negative for activity change, appetite  change, fatigue, fever and unexpected weight change.  HENT: Negative for nosebleeds, sore throat and trouble swallowing.   Eyes: Negative for redness and visual disturbance.  Respiratory: Positive for cough. Negative for shortness of breath and wheezing.   Cardiovascular: Negative for chest pain, palpitations and leg swelling.  Gastrointestinal: Negative for abdominal pain, nausea and vomiting.       No changes in bowel habits.  Genitourinary: Negative for decreased urine volume and hematuria.  Musculoskeletal: Negative for gait problem and myalgias.  Skin: Negative for rash.  Neurological: Negative for dizziness, syncope, weakness and headaches.  Psychiatric/Behavioral: Negative for confusion. The patient is not nervous/anxious.      Current Outpatient Prescriptions on File Prior to Visit  Medication Sig Dispense Refill  . amLODipine-olmesartan (AZOR) 5-20 MG tablet Take 1 tablet by mouth daily. 90 tablet 3  . aspirin EC 81 MG tablet Take 81 mg by mouth daily.    [name] atorvastatin (LIPITOR) 40 MG tablet Take 1 tablet (40 mg total) by mouth daily at 6 PM. 90 tablet 3  . cetirizine (ZYRTEC) 10 MG tablet Take 1 tablet (10 mg total) by mouth daily. 90 tablet 2  . DULoxetine (CYMBALTA) 30 MG capsule Take 1 capsule (30 mg total) by mouth daily. 90 capsule 2  . fluticasone (FLONASE) 50 MCG/ACT nasal spray Place 2 sprays into both nostrils daily. (Patient taking differently: Place 2 sprays into both nostrils daily as needed. ) 16 g 5  . omeprazole (PRILOSEC) 20 MG capsule Take 1 capsule (20 mg total) by mouth daily.  Before breakfast 90 capsule 1  . sodium chloride (OCEAN) 0.65 % nasal spray Place 1 spray into the nose daily as needed for congestion.     No current facility-administered medications on file prior to visit.      Past Medical History:  Diagnosis Date  . Arthritis    gen.  and left hip  . BPH (benign prostatic hypertrophy)   . GERD (gastroesophageal reflux disease)    occasional   . History of kidney stones    2007  . Hyperlipidemia   . Hypertension   . Kidney tumor   . LBP (low back pain) 06/2005   L5 radicular symptoms; MRI of LS spine severe spondylosis at L4-L5 with central cancal stenosis   . Nocturia   . Organic impotence   . Prostate cancer ([place]) 10/2011   (Low grade) [facility] - Dr. Junious Silk  . Pyelonephritis 03/05/2013  . Renal oncocytoma of right kidney 01/23/2013   As 3.2 x 3.5 cm enhancing exophytic mass projecting off the lower pole. This is consistent with a renal cell carcinoma. No retroperitoneal lymphadenopathy.   [name] Spermatocele   . 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 Wears glasses    Allergies  Allergen Reactions  . Ace Inhibitors Other (See Comments)    REACTION: Cough  . Codeine     Severe hiccups  . Percocet [Oxycodone-Acetaminophen]     Severe hiccups    Social History   Social History  . Marital status: Married    Spouse name: N/A  . Number of children: 2  . Years of education: N/A   Social History Main Topics  . Smoking status: Former Smoker    Packs/day: 0.25    Years: 10.00    Types: Cigarettes    Quit date: 02/28/1981  . Smokeless tobacco: Never Used  . Alcohol use 0.0 oz/week     Comment: occasional  . Drug use: No  . Sexual activity: Not Asked   Other Topics Concern  . None   Social History Narrative   Married- 56 yrs   2 daughters   5 grandchildren   Never Smoked   Alcohol use-yes (occasional/social)   Retired- Engineer, water      Physician roster:      Urologist - Dr. [name]   Orthopedics - Dr. [name]   Dermatology - Dr. [name]    Vitals:   09/09/16 1003  BP: 118/78  Pulse: 62  Resp: 12  O2 sat at RA 99% Body mass index is 26.62 kg/m.   Physical Exam  Nursing note and vitals reviewed. Constitutional: He is oriented to person, place, and time. He appears well-developed and well-nourished. No distress.  HENT:  Head: Atraumatic.  Mouth/Throat: Oropharynx is clear and moist and mucous membranes  are normal.  Eyes: Pupils are equal, round, and reactive to light. Conjunctivae and EOM are normal.  Neck: No tracheal deviation present. No thyroid mass and no thyromegaly present.  Cardiovascular: Normal rate and regular rhythm.   No murmur heard. Pulses:      Dorsalis pedis pulses are 2+ on the right side, and 2+ on the left side.  Respiratory: Effort normal and breath sounds normal. No respiratory distress.  GI: Soft. He exhibits no mass. There is no hepatomegaly. There is no tenderness.  Musculoskeletal: He exhibits no edema.  Lymphadenopathy:    He has no cervical adenopathy.  Neurological: He is alert and oriented to person, place, and time. He has normal strength. Gait normal.  Skin: Skin  is warm. No rash noted. No erythema.  Psychiatric: He has a normal mood and affect. Cognition and memory are normal.  Well groomed, good eye contact.     ASSESSMENT AND PLAN:   David [name] was seen today for follow-up.  Diagnoses and all orders for this visit:  Gastroesophageal reflux disease without esophagitis  Cough at night could be related to this problem. He also has Hx of allergies, which can also aggravate or cause cough.  He is not interested in trying to add another Omeprazole dose at night or adding Zantac. GERD precautions to continue.  Essential hypertension  Adequately controlled. No changes in current management. DASH diet recommended. Eye exam last week. F/U in 6 months, before if needed.  Hyperglycemia  A1C 5.7. Continue healthy life style.  -     POCT glycosylated hemoglobin (Hb A1C)  Elevated alkaline phosphatase level  We discussed possible etiologies. Keep lab appt in 10/2016 as planned with cardiologists. Instructed about warning signs.  Exertional dyspnea  Resolved. He does not need to resume Symbicort.  Other orders -     Zoster Vac Recomb Adjuvanted [facility]) injection; Inject 0.5 mLs into the muscle every 3 (three) months.    -Mr.  Markham Denina [name] was advised to return sooner than planned today if new concerns arise.       Endya Austin G. [name], MD  [facility]. [place] office.

## 2016-09-09 ENCOUNTER — Encounter: Payer: Self-pay | Admitting: Family Medicine

## 2016-09-09 ENCOUNTER — Ambulatory Visit (INDEPENDENT_AMBULATORY_CARE_PROVIDER_SITE_OTHER): Payer: Medicare Other | Admitting: Family Medicine

## 2016-09-09 VITALS — BP 118/78 | HR 62 | Resp 12 | Ht 72.0 in | Wt 196.2 lb

## 2016-09-09 DIAGNOSIS — R739 Hyperglycemia, unspecified: Secondary | ICD-10-CM

## 2016-09-09 DIAGNOSIS — I10 Essential (primary) hypertension: Secondary | ICD-10-CM

## 2016-09-09 DIAGNOSIS — K219 Gastro-esophageal reflux disease without esophagitis: Secondary | ICD-10-CM

## 2016-09-09 DIAGNOSIS — R748 Abnormal levels of other serum enzymes: Secondary | ICD-10-CM

## 2016-09-09 DIAGNOSIS — R0609 Other forms of dyspnea: Secondary | ICD-10-CM | POA: Diagnosis not present

## 2016-09-09 LAB — POCT GLYCOSYLATED HEMOGLOBIN (HGB A1C): Hemoglobin A1C: 5.7

## 2016-09-09 MED ORDER — ZOSTER VAC RECOMB ADJUVANTED 50 MCG/0.5ML IM SUSR: 0.5000 mL | INTRAMUSCULAR | 1 refills | Status: AC

## 2016-09-09 NOTE — Patient Instructions (Addendum)
A few things to remember from today's visit:   Essential hypertension  Hyperglycemia - Plan: POCT glycosylated hemoglobin (Hb A1C)  Gastroesophageal reflux disease without esophagitis  Exertional dyspnea  Diabetes test today negative, 5.7.    Keep lab and cardiology appt in 10/2016.     Please be sure medication list is accurate. If a new problem present, please set up appointment sooner than planned today.

## 2016-09-13 DIAGNOSIS — L821 Other seborrheic keratosis: Secondary | ICD-10-CM | POA: Diagnosis not present

## 2016-09-13 DIAGNOSIS — L570 Actinic keratosis: Secondary | ICD-10-CM | POA: Diagnosis not present

## 2016-09-13 DIAGNOSIS — L820 Inflamed seborrheic keratosis: Secondary | ICD-10-CM | POA: Diagnosis not present

## 2016-09-13 DIAGNOSIS — L578 Other skin changes due to chronic exposure to nonionizing radiation: Secondary | ICD-10-CM | POA: Diagnosis not present

## 2016-09-13 DIAGNOSIS — Z85828 Personal history of other malignant neoplasm of skin: Secondary | ICD-10-CM | POA: Diagnosis not present

## 2016-09-22 NOTE — Progress Notes (Signed)
Subjective:   David [name] is a 76 y.o. male who presents for Medicare Annual/Subsequent preventive examination.  Review of Systems:  No ROS.  Medicare Wellness Visit. Additional risk factors are reflected in the social history.  Cardiac Risk Factors include: advanced age (>46men, >32 women);dyslipidemia;family history of premature cardiovascular disease;hypertension;male gender   Sleep patterns: Sleeps 8-9 hours, feels rested. Up to void x 2.  Home Safety/Smoke Alarms: Feels safe in home. Smoke alarms in place.  Living environment; residence and Firearm Safety: Lives with wife in 1 story home.  Seat Belt Safety/Bike Helmet: Wears seat belt.   Counseling:   Eye Exam-Last exam 08/2016, yearly. Dr. Sabra Heck  Dental-Last exam 08/2015, yearly. Dr. Olen Cordial  Male:   CCS-Colonoscopy 07/18/2012, normal. Recall 5 years.      PSA- H/O Prostate Ca, Followed by [facility].       Objective:    Vitals: BP (!) 108/50 (BP Location: Left Arm, Patient Position: Sitting, Cuff Size: Normal)   Pulse (!) 57   Ht 6' (1.829 m)   Wt 190 lb 1.6 oz (86.2 kg)   SpO2 98%   BMI 25.78 kg/m   Body mass index is 25.78 kg/m.  Tobacco History  Smoking Status  . Former Smoker  . Packs/day: 0.25  . Years: 10.00  . Types: Cigarettes  . Quit date: 02/28/1981  Smokeless Tobacco  . Never Used     Counseling given: No   Past Medical History:  Diagnosis Date  . Arthritis    gen.  and left hip  . BPH (benign prostatic hypertrophy)   . GERD (gastroesophageal reflux disease)    occasional  . History of kidney stones    2007  . Hyperlipidemia   . Hypertension   . Kidney tumor   . LBP (low back pain) 06/2005   L5 radicular symptoms; MRI of LS spine severe spondylosis at L4-L5 with central cancal stenosis   . Nocturia   . Organic impotence   . Prostate cancer ([place]) 10/2011   (Low grade) [facility] - Dr. Junious Silk  . Pyelonephritis 03/05/2013  . Renal oncocytoma of right kidney  01/23/2013   As 3.2 x 3.5 cm enhancing exophytic mass projecting off the lower pole. This is consistent with a renal cell carcinoma. No retroperitoneal lymphadenopathy.   [name] Spermatocele   . 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 Wears glasses    Past Surgical History:  Procedure Laterality Date  . COLONOSCOPY  02/15/2006, 2014   Internal hemorrhoids (Dr [name]), last in 2014  . INGUINAL HERNIA REPAIR Right 1980  . LYMPHADENECTOMY Bilateral 02/15/2013   Procedure: LYMPHADENECTOMY WITH INDOCYANINE GREEN DYE INJECTION;  Surgeon: [name], MD;  Location: [facility];  Service: Urology;  Laterality: Bilateral;  . PROSTATE BIOPSY N/A 12/18/2012   Procedure: BIOPSY TRANSRECTAL ULTRASONIC PROSTATE (TUBP);  Surgeon: [name], MD;  Location[facility];  Service: Urology;  Laterality: N/A;  . PROSTATE BIOPSY  12/05/11   gleason 6, 3/12 cores  . REPAIR UMBILICAL AND VENTRAL HERNIA'S W/ MESH  08-21-2009  . ROBOT ASSISTED LAPAROSCOPIC RADICAL PROSTATECTOMY N/A 02/15/2013   Procedure: ROBOTIC ASSISTED LAPAROSCOPIC RADICAL PROSTATECTOMY;  Surgeon: [name], MD;  Location: [facility];  Service: Urology;  Laterality: N/A;  . ROBOTIC ASSITED PARTIAL NEPHRECTOMY Right 02/15/2013   Procedure: ROBOTIC ASSITED PARTIAL NEPHRECTOMY;  Surgeon: [name], MD;  Location: [facility];  Service: Urology;  Laterality: Right;  . SPERMATOCELECTOMY Left 12/18/2012   Procedure: LEFT SPERMATOCELECTOMY;  Surgeon: [name],  MD;  Location: [place];  Service: Urology;  Laterality: Left;  . TONSILLECTOMY AND ADENOIDECTOMY  as child  . TRANSURETHRAL RESECTION OF PROSTATE  AGE 43   Family History  Problem Relation Age of Onset  . Cancer Mother        colon  . Colon cancer Mother 69  . Pulmonary fibrosis Father        father owned a Radio broadcast assistant (father blamed inhalation of chemicals and fumes)  . Pulmonary fibrosis Brother   . Coronary artery disease Other   .  Hyperlipidemia Other    History  Sexual Activity  . Sexual activity: Not on file    Outpatient Encounter Prescriptions as of 09/23/2016  Medication Sig  . amLODipine-olmesartan (AZOR) 5-20 MG tablet Take 1 tablet by mouth daily.  [name] aspirin EC 81 MG tablet Take 81 mg by mouth daily.  [name] atorvastatin (LIPITOR) 40 MG tablet Take 1 tablet (40 mg total) by mouth daily at 6 PM.  . DULoxetine (CYMBALTA) 30 MG capsule Take 1 capsule (30 mg total) by mouth daily.  . fluticasone (FLONASE) 50 MCG/ACT nasal spray Place 2 sprays into both nostrils daily. (Patient taking differently: Place 2 sprays into both nostrils daily as needed. )  . omeprazole (PRILOSEC) 20 MG capsule Take 1 capsule (20 mg total) by mouth daily. Before breakfast  . sodium chloride (OCEAN) 0.65 % nasal spray Place 1 spray into the nose daily as needed for congestion.  . cetirizine (ZYRTEC) 10 MG tablet Take 1 tablet (10 mg total) by mouth daily. (Patient not taking: Reported on 09/23/2016)  . Zoster Vac Recomb Adjuvanted [facility]) injection Inject 0.5 mLs into the muscle every 3 (three) months. (Patient not taking: Reported on 09/23/2016)   No facility-administered encounter medications on file as of 09/23/2016.     Activities of Daily Living In your present state of health, do you have any difficulty performing the following activities: 09/23/2016  Hearing? N  Vision? N  Difficulty concentrating or making decisions? N  Walking or climbing stairs? N  Dressing or bathing? N  Doing errands, shopping? N  Preparing Food and eating ? N  Using the Toilet? N  In the past six months, have you accidently leaked urine? N  Do you have problems with loss of bowel control? N  Managing your Medications? N  Managing your Finances? N  Housekeeping or managing your Housekeeping? N  Some recent data might be hidden    Patient Care Team: [name], [name] G, MD as PCP - General (Family Medicine) Stanford Breed Denice Bors, MD as Consulting Physician  (Cardiology) [facility], [facility] Jarome Matin, MD as Consulting Physician (Dermatology)   Assessment:    Physical assessment deferred to PCP.  Exercise Activities and Dietary recommendations Current Exercise Habits: Home exercise routine (Yard work; housework), Type of exercise: walking, Time (Minutes): 15, Frequency (Times/Week): 6, Weekly Exercise (Minutes/Week): 90, Exercise limited by: None identified   Diet (meal preparation, eat out, water intake, caffeinated beverages, dairy products, fruits and vegetables): Drinks coffee, water and occasional decaf tea.  Breakfast: cereal and fruit/nuts; eggs, bacon,toast, grits Lunch: leftovers, sandwich  Dinner: protein, vegetables.    Goals    . Weight (lb) < 200 lb (90.7 kg)          Maintain weight well below 200 lbs. Watch diet and be as active as possible.       Fall Risk Fall Risk  09/23/2016 06/25/2015 05/21/2014 03/18/2013  Falls in the past year?  No No No No   Depression Screen PHQ 2/9 Scores 09/23/2016 06/25/2015 05/21/2014 03/18/2013  PHQ - 2 Score 0 0 0 0    Cognitive Function       Ad8 score reviewed for issues:  Issues making decisions: no  Less interest in hobbies / activities: no  Repeats questions, stories (family complaining): no  Trouble using ordinary gadgets (microwave, computer, phone): no  Forgets the month or year: no  Mismanaging finances: no  Remembering appts: no  Daily problems with thinking and/or memory: no Ad8 score is=0  Diabetic Foot Exam - Simple   Simple Foot Form Diabetic Foot exam was performed with the following findings:  Yes 09/23/2016  4:20 AM  Visual Inspection No deformities, no ulcerations, no other skin breakdown bilaterally:  Yes Sensation Testing Intact to touch and monofilament testing bilaterally:  Yes Pulse Check Posterior Tibialis and Dorsalis pulse intact bilaterally:  Yes Comments       Immunization History  Administered Date(s) Administered  .  Influenza Split 11/18/2010, 11/23/2011  . Influenza Whole 11/27/2007, 11/26/2008, 11/20/2009  . Influenza, High Dose Seasonal PF 11/24/2014, 11/18/2015  . Influenza,inj,Quad PF,36+ Mos 11/23/2012, 11/15/2013  . Pneumococcal Conjugate-13 11/15/2013  . Pneumococcal Polysaccharide-23 03/06/2008  . Td 03/06/2008   Patient has rx for Shingrix.   Screening Tests Health Maintenance  Topic Date Due  . FOOT EXAM  04/08/1950  . OPHTHALMOLOGY EXAM  04/08/1950  . INFLUENZA VACCINE  09/28/2016  . HEMOGLOBIN A1C  03/12/2017  . COLONOSCOPY  07/18/2017  . TETANUS/TDAP  03/06/2018  . PNA vac Low Risk Adult  Completed      Plan:    Bring a copy of your advance directives to your next office visit.  Continue doing brain stimulating activities (puzzles, reading, adult coloring books, staying active) to keep memory sharp.     I have personally reviewed and noted the following in the patient's chart:   . Medical and social history . Use of alcohol, tobacco or illicit drugs  . Current medications and supplements . Functional ability and status . Nutritional status . Physical activity . Advanced directives . List of other physicians . Hospitalizations, surgeries, and ER visits in previous 12 months . Vitals . Screenings to include cognitive, depression, and falls . Referrals and appointments  In addition, I have reviewed and discussed with patient certain preventive protocols, quality metrics, and best practice recommendations. A written personalized care plan for preventive services as well as general preventive health recommendations were provided to patient.     Gerilyn Nestle, RN  09/23/2016

## 2016-09-23 ENCOUNTER — Ambulatory Visit (INDEPENDENT_AMBULATORY_CARE_PROVIDER_SITE_OTHER): Payer: Medicare Other

## 2016-09-23 VITALS — BP 108/50 | HR 57 | Ht 72.0 in | Wt 190.1 lb

## 2016-09-23 DIAGNOSIS — Z0000 Encounter for general adult medical examination without abnormal findings: Secondary | ICD-10-CM

## 2016-09-23 NOTE — Patient Instructions (Addendum)
David [name] , Thank you for taking time to come for your Medicare Wellness Visit. I appreciate your ongoing commitment to your health goals. Please review the following plan we discussed and let me know if I can assist you in the future.   These are the goals we discussed: Goals    . Weight (lb) < 200 lb (90.7 kg)          Maintain weight well below 200 lbs. Watch diet and be as active as possible.        This is a list of the screening recommended for you and due dates:  Health Maintenance  Topic Date Due  . Eye exam for diabetics  08/28/2016  . Flu Shot  09/28/2016  . Hemoglobin A1C  03/12/2017  . Colon Cancer Screening  07/18/2017  . Complete foot exam   09/23/2017  . Tetanus Vaccine  03/06/2018  . Pneumonia vaccines  Completed   Bring a copy of your advance directives to your next office visit.  Continue doing brain stimulating activities (puzzles, reading, adult coloring books, staying active) to keep memory sharp.    Fall Prevention in the Home Falls can cause injuries. They can happen to people of all ages. There are many things you can do to make your home safe and to help prevent falls. What can I do on the outside of my home?  Regularly fix the edges of walkways and driveways and fix any cracks.  Remove anything that might make you trip as you walk through a door, such as a raised step or threshold.  Trim any bushes or trees on the path to your home.  Use bright outdoor lighting.  Clear any walking paths of anything that might make someone trip, such as rocks or tools.  Regularly check to see if handrails are loose or broken. Make sure that both sides of any steps have handrails.  Any raised decks and porches should have guardrails on the edges.  Have any leaves, snow, or ice cleared regularly.  Use sand or salt on walking paths during winter.  Clean up any spills in your garage right away. This includes oil or grease spills. What can I do in the  bathroom?  Use night lights.  Install grab bars by the toilet and in the tub and shower. Do not use towel bars as grab bars.  Use non-skid mats or decals in the tub or shower.  If you need to sit down in the shower, use a plastic, non-slip stool.  Keep the floor dry. Clean up any water that spills on the floor as soon as it happens.  Remove soap buildup in the tub or shower regularly.  Attach bath mats securely with double-sided non-slip rug tape.  Do not have throw rugs and other things on the floor that can make you trip. What can I do in the bedroom?  Use night lights.  Make sure that you have a light by your bed that is easy to reach.  Do not use any sheets or blankets that are too big for your bed. They should not hang down onto the floor.  Have a firm chair that has side arms. You can use this for support while you get dressed.  Do not have throw rugs and other things on the floor that can make you trip. What can I do in the kitchen?  Clean up any spills right away.  Avoid walking on wet floors.  Keep items that you  use a lot in easy-to-reach places.  If you need to reach something above you, use a strong step stool that has a grab bar.  Keep electrical cords out of the way.  Do not use floor polish or wax that makes floors slippery. If you must use wax, use non-skid floor wax.  Do not have throw rugs and other things on the floor that can make you trip. What can I do with my stairs?  Do not leave any items on the stairs.  Make sure that there are handrails on both sides of the stairs and use them. Fix handrails that are broken or loose. Make sure that handrails are as long as the stairways.  Check any carpeting to make sure that it is firmly attached to the stairs. Fix any carpet that is loose or worn.  Avoid having throw rugs at the top or bottom of the stairs. If you do have throw rugs, attach them to the floor with carpet tape.  Make sure that you have a  light switch at the top of the stairs and the bottom of the stairs. If you do not have them, ask someone to add them for you. What else can I do to help prevent falls?  Wear shoes that: ? Do not have high heels. ? Have rubber bottoms. ? Are comfortable and fit you well. ? Are closed at the toe. Do not wear sandals.  If you use a stepladder: ? Make sure that it is fully opened. Do not climb a closed stepladder. ? Make sure that both sides of the stepladder are locked into place. ? Ask someone to hold it for you, if possible.  Clearly mark and make sure that you can see: ? Any grab bars or handrails. ? First and last steps. ? Where the edge of each step is.  Use tools that help you move around (mobility aids) if they are needed. These include: ? Canes. ? Walkers. ? Scooters. ? Crutches.  Turn on the lights when you go into a dark area. Replace any light bulbs as soon as they burn out.  Set up your furniture so you have a clear path. Avoid moving your furniture around.  If any of your floors are uneven, fix them.  If there are any pets around you, be aware of where they are.  Review your medicines with your doctor. Some medicines can make you feel dizzy. This can increase your chance of falling. Ask your doctor what other things that you can do to help prevent falls. This information is not intended to replace advice given to you by your health care provider. Make sure you discuss any questions you have with your health care provider. Document Released: 12/11/2008 Document Revised: 07/23/2015 Document Reviewed: 03/21/2014 Elsevier Interactive Patient Education  2018 [place] Maintenance, Male A healthy lifestyle and preventive care is important for your health and wellness. Ask your health care provider about what schedule of regular examinations is right for you. What should I know about weight and diet? Eat a Healthy Diet  Eat plenty of vegetables, fruits,  whole grains, low-fat dairy products, and lean protein.  Do not eat a lot of foods high in solid fats, added sugars, or salt.  Maintain a Healthy Weight Regular exercise can help you achieve or maintain a healthy weight. You should:  Do at least 150 minutes of exercise each week. The exercise should increase your heart rate and make you sweat (moderate-intensity  exercise).  Do strength-training exercises at least twice a week.  Watch Your Levels of Cholesterol and Blood Lipids  Have your blood tested for lipids and cholesterol every 5 years starting at 75 years of age. If you are at high risk for heart disease, you should start having your blood tested when you are 75 years old. You may need to have your cholesterol levels checked more often if: ? Your lipid or cholesterol levels are high. ? You are older than 75 years of age. ? You are at high risk for heart disease.  What should I know about cancer screening? Many types of cancers can be detected early and may often be prevented. Lung Cancer  You should be screened every year for lung cancer if: ? You are a current smoker who has smoked for at least 30 years. ? You are a former smoker who has quit within the past 15 years.  Talk to your health care provider about your screening options, when you should start screening, and how often you should be screened.  Colorectal Cancer  Routine colorectal cancer screening usually begins at 75 years of age and should be repeated every 5-10 years until you are 75 years old. You may need to be screened more often if early forms of precancerous polyps or small growths are found. Your health care provider may recommend screening at an earlier age if you have risk factors for colon cancer.  Your health care provider may recommend using home test kits to check for hidden blood in the stool.  A small camera at the end of a tube can be used to examine your colon (sigmoidoscopy or colonoscopy). This  checks for the earliest forms of colorectal cancer.  Prostate and Testicular Cancer  Depending on your age and overall health, your health care provider may do certain tests to screen for prostate and testicular cancer.  Talk to your health care provider about any symptoms or concerns you have about testicular or prostate cancer.  Skin Cancer  Check your skin from head to toe regularly.  Tell your health care provider about any new moles or changes in moles, especially if: ? There is a change in a mole's size, shape, or color. ? You have a mole that is larger than a pencil eraser.  Always use sunscreen. Apply sunscreen liberally and repeat throughout the day.  Protect yourself by wearing long sleeves, pants, a wide-brimmed hat, and sunglasses when outside.  What should I know about heart disease, diabetes, and high blood pressure?  If you are 72-38 years of age, have your blood pressure checked every 3-5 years. If you are 66 years of age or older, have your blood pressure checked every year. You should have your blood pressure measured twice-once when you are at a hospital or clinic, and once when you are not at a hospital or clinic. Record the average of the two measurements. To check your blood pressure when you are not at a hospital or clinic, you can use: ? An automated blood pressure machine at a pharmacy. ? A home blood pressure monitor.  Talk to your health care provider about your target blood pressure.  If you are between 67-45 years old, ask your health care provider if you should take aspirin to prevent heart disease.  Have regular diabetes screenings by checking your fasting blood sugar level. ? If you are at a normal weight and have a low risk for diabetes, have this test once every  three years after the age of 47. ? If you are overweight and have a high risk for diabetes, consider being tested at a younger age or more often.  A one-time screening for abdominal aortic  aneurysm (AAA) by ultrasound is recommended for men aged 62-75 years who are current or former smokers. What should I know about preventing infection? Hepatitis B If you have a higher risk for hepatitis B, you should be screened for this virus. Talk with your health care provider to find out if you are at risk for hepatitis B infection. Hepatitis C Blood testing is recommended for:  Everyone born from [birth year] through 1964.  Anyone with known risk factors for hepatitis C.  Sexually Transmitted Diseases (STDs)  You should be screened each year for STDs including gonorrhea and chlamydia if: ? You are sexually active and are younger than 75 years of age. ? You are older than 75 years of age and your health care provider tells you that you are at risk for this type of infection. ? Your sexual activity has changed since you were last screened and you are at an increased risk for chlamydia or gonorrhea. Ask your health care provider if you are at risk.  Talk with your health care provider about whether you are at high risk of being infected with HIV. Your health care provider may recommend a prescription medicine to help prevent HIV infection.  What else can I do?  Schedule regular health, dental, and eye exams.  Stay current with your vaccines (immunizations).  Do not use any tobacco products, such as cigarettes, chewing tobacco, and e-cigarettes. If you need help quitting, ask your health care provider.  Limit alcohol intake to no more than 2 drinks per day. One drink equals 12 ounces of beer, 5 ounces of wine, or 1 ounces of hard liquor.  Do not use street drugs.  Do not share needles.  Ask your health care provider for help if you need support or information about quitting drugs.  Tell your health care provider if you often feel depressed.  Tell your health care provider if you have ever been abused or do not feel safe at home. This information is not intended to replace advice  given to you by your health care provider. Make sure you discuss any questions you have with your health care provider. Document Released: 08/13/2007 Document Revised: 10/14/2015 Document Reviewed: 11/18/2014 Elsevier Interactive Patient Education  [employer].

## 2016-09-24 NOTE — Progress Notes (Signed)
I have reviewed documentation from this visit and I agree with recommendations given.  Elicia Lui G. Katielynn Horan, MD  [facility]. [place] office.   

## 2016-10-22 ENCOUNTER — Other Ambulatory Visit: Payer: Self-pay | Admitting: Family Medicine

## 2016-10-22 DIAGNOSIS — K219 Gastro-esophageal reflux disease without esophagitis: Secondary | ICD-10-CM

## 2016-10-26 ENCOUNTER — Telehealth: Payer: Self-pay | Admitting: Cardiology

## 2016-10-26 NOTE — Telephone Encounter (Signed)
Returned call to patient, reports he is suppose to have repeat blood work in September and lost the order forms.   Advised I will print requisitions and place in box for lab tech to have when he comes.  Patient states he will come next week to have these drawn.

## 2016-10-26 NOTE — Telephone Encounter (Signed)
New message    Pt is calling asking for a call back about his lab work he is due for in September. Please call.

## 2016-11-02 DIAGNOSIS — R748 Abnormal levels of other serum enzymes: Secondary | ICD-10-CM | POA: Diagnosis not present

## 2016-11-02 LAB — HEPATIC FUNCTION PANEL
ALK PHOS: 135 IU/L — AB (ref 39–117)
ALT: 27 IU/L (ref 0–44)
AST: 24 IU/L (ref 0–40)
Albumin: 4.2 g/dL (ref 3.5–4.8)
Bilirubin Total: 0.5 mg/dL (ref 0.0–1.2)
Bilirubin, Direct: 0.14 mg/dL (ref 0.00–0.40)
Total Protein: 7 g/dL (ref 6.0–8.5)

## 2016-11-02 LAB — GAMMA GT: GGT: 29 IU/L (ref 0–65)

## 2016-11-03 LAB — NUCLEOTIDASE, 5', BLOOD: 5-Nucleotidase: 5 IU/L (ref 0–10)

## 2016-11-04 ENCOUNTER — Telehealth: Payer: Self-pay | Admitting: *Deleted

## 2016-11-04 DIAGNOSIS — R748 Abnormal levels of other serum enzymes: Secondary | ICD-10-CM

## 2016-11-04 NOTE — Telephone Encounter (Signed)
Left message for pt to call.

## 2016-11-04 NOTE — Telephone Encounter (Signed)
New message    Pt is calling back for Debra. Please call.

## 2016-11-04 NOTE — Telephone Encounter (Addendum)
-----   Message from Lelon Perla, MD sent at 11/03/2016  3:49 PM EDT ----- Check liver ultrasound [name]    Left message for pt to call

## 2016-11-04 NOTE — Telephone Encounter (Signed)
Spoke with pt, aware of results. [place] scheduled.

## 2016-11-09 ENCOUNTER — Ambulatory Visit
Admission: RE | Admit: 2016-11-09 | Discharge: 2016-11-09 | Disposition: A | Discharge status: Home / Self Care | Payer: Medicare Other | Source: Ambulatory Visit | Attending: Cardiology | Admitting: Cardiology

## 2016-11-09 DIAGNOSIS — R748 Abnormal levels of other serum enzymes: Secondary | ICD-10-CM

## 2016-11-09 DIAGNOSIS — K7689 Other specified diseases of liver: Secondary | ICD-10-CM | POA: Diagnosis not present

## 2016-11-17 ENCOUNTER — Encounter: Payer: Self-pay | Admitting: Family Medicine

## 2016-11-28 ENCOUNTER — Other Ambulatory Visit: Payer: Self-pay | Admitting: Family Medicine

## 2016-11-28 DIAGNOSIS — J309 Allergic rhinitis, unspecified: Secondary | ICD-10-CM

## 2016-12-12 ENCOUNTER — Ambulatory Visit (INDEPENDENT_AMBULATORY_CARE_PROVIDER_SITE_OTHER): Payer: Medicare Other | Admitting: *Deleted

## 2016-12-12 DIAGNOSIS — Z23 Encounter for immunization: Secondary | ICD-10-CM | POA: Diagnosis not present

## 2017-01-20 ENCOUNTER — Other Ambulatory Visit: Payer: Self-pay | Admitting: Family Medicine

## 2017-01-20 DIAGNOSIS — M159 Polyosteoarthritis, unspecified: Secondary | ICD-10-CM

## 2017-01-23 MED ORDER — DULOXETINE HCL 30 MG PO CPEP: 30.0000 mg | ORAL_CAPSULE | Freq: Every day | ORAL | 0 refills | Status: DC

## 2017-01-23 NOTE — Addendum Note (Signed)
Addended by: Zacarias Pontes on: 01/23/2017 04:37 PM   Modules accepted: Orders

## 2017-03-06 NOTE — Progress Notes (Signed)
HPI:   [name] is a 77 y.o. male, who is here today for 6 months follow up.   He was last seen on 09/09/16.  Since his last OV he has had his Medicare Preventive visit.  HTN: Currently he is on Azor 5-20 mg daily. Home BP's: 128-130's/60-70's  Last eye exam: within a year.  Denies severe/frequent headache, visual changes, chest pain, dyspnea, palpitation, claudication, focal weakness, or edema.  Lab Results  Component Value Date   CREATININE 1.08 06/10/2016   BUN 20 06/10/2016   NA 140 06/10/2016   K 3.9 06/10/2016   CL 109 06/10/2016   CO2 25 06/10/2016    Generalized OA:  Currently he is on Cymbalta 30 mg daily, which has helped with joint pain. Pain is mainly on shoulders,wrists,and knees. Occasionally he also has lower and upper back pain. If he "overdo" it he has some pain, "not bad."   Elevated alk phosphatase.  Lab Results  Component Value Date   ALT 27 11/02/2016   AST 24 11/02/2016   GGT 29 11/02/2016   ALKPHOS 135 (H) 11/02/2016   BILITOT 0.5 11/02/2016   Hx of prostate cancer, next appt with urologist 03/2016. He denies high alcohol consumption or abuse.   Denies abdominal pain, nausea, vomiting, changes in bowel habits, or melena.   He is also concerned about "knots" he found in right breast about 3 months ago, he has no noted growth and there is not tenderness or skin changes.  Another lesion in the right rib cage, nontender, no growth, noted in the past week. FHx for breast cancer neg.  No Hx of trauma. No nipple discharge or chest wall pain.   Review of Systems  Constitutional: Negative for activity change, appetite change, fatigue, fever and unexpected weight change.  HENT: Negative for nosebleeds, sore throat and trouble swallowing.   Eyes: Negative for redness and visual disturbance.  Respiratory: Negative for apnea, cough, shortness of breath and wheezing.   Cardiovascular: Negative for chest pain, palpitations and  leg swelling.  Gastrointestinal: Negative for abdominal pain, blood in stool, nausea and vomiting.       No changes in bowel habits.  Genitourinary: Negative for decreased urine volume, dysuria and hematuria.  Musculoskeletal: Positive for arthralgias. Negative for gait problem.  Skin: Negative for rash and wound.  Neurological: Negative for syncope, weakness and headaches.  Hematological: Negative for adenopathy. Does not bruise/bleed easily.  Psychiatric/Behavioral: Negative for confusion. The patient is nervous/anxious.       Current Outpatient Medications on File Prior to Visit  Medication Sig Dispense Refill  . amLODipine-olmesartan (AZOR) 5-20 MG tablet Take 1 tablet by mouth daily. 90 tablet 3  . aspirin EC 81 MG tablet Take 81 mg by mouth daily.    [name] atorvastatin (LIPITOR) 40 MG tablet Take 1 tablet (40 mg total) by mouth daily at 6 PM. 90 tablet 3  . cetirizine (ZYRTEC) 10 MG tablet Take 1 tablet (10 mg total) by mouth daily. 90 tablet 2  . fluticasone (FLONASE) 50 MCG/ACT nasal spray USE 2 SPRAYS IN EACH NOSTRIL ONCE DAILY 16 g 3  . omeprazole (PRILOSEC) 20 MG capsule TAKE 1 CAPSULE BY MOUTH ONCE DAILY BEFORE BREAKFAST 90 capsule 2  . sodium chloride (OCEAN) 0.65 % nasal spray Place 1 spray into the nose daily as needed for congestion.     No current facility-administered medications on file prior to visit.      Past Medical History:  Diagnosis Date  . Arthritis    gen.  and left hip  . BPH (benign prostatic hypertrophy)   . GERD (gastroesophageal reflux disease)    occasional  . History of kidney stones    2007  . Hyperlipidemia   . Hypertension   . Kidney tumor   . LBP (low back pain) 06/2005   L5 radicular symptoms; MRI of LS spine severe spondylosis at L4-L5 with central cancal stenosis   . Nocturia   . Organic impotence   . Prostate cancer ([place]) 10/2011   (Low grade) [facility] - Dr. Junious Silk  . Pyelonephritis 03/05/2013  . Renal oncocytoma of right  kidney 01/23/2013   As 3.2 x 3.5 cm enhancing exophytic mass projecting off the lower pole. This is consistent with a renal cell carcinoma. No retroperitoneal lymphadenopathy.   [name] Spermatocele   . 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 Wears glasses    Allergies  Allergen Reactions  . Ace Inhibitors Other (See Comments)    REACTION: Cough  . Codeine     Severe hiccups  . Percocet [Oxycodone-Acetaminophen]     Severe hiccups    Social History   Socioeconomic History  . Marital status: Married    Spouse name: None  . Number of children: 2  . Years of education: None  . Highest education level: None  Social Needs  . Financial resource strain: None  . Food insecurity - worry: None  . Food insecurity - inability: None  . Transportation needs - medical: None  . Transportation needs - non-medical: None  Occupational History  . None  Tobacco Use  . Smoking status: Former Smoker    Packs/day: 0.25    Years: 10.00    Pack years: 2.50    Types: Cigarettes    Last attempt to quit: 02/28/1981    Years since quitting: 36.0  . Smokeless tobacco: Never Used  Substance and Sexual Activity  . Alcohol use: Yes    Alcohol/week: 0.0 oz    Comment: occasional  . Drug use: No  . Sexual activity: None  Other Topics Concern  . None  Social History Narrative   Married- 39 yrs   2 daughters   5 grandchildren   Never Smoked   Alcohol use-yes (occasional/social)   Retired- Engineer, water      Physician roster:      Urologist - Dr. [name]   Orthopedics - Dr. [name]   Dermatology - Dr. [name]    Vitals:   03/07/17 0753  BP: 132/70  Pulse: 72  Resp: 12  Temp: 97.9 F (36.6 C)  SpO2: 95%   Body mass index is 27.6 kg/m.   Physical Exam  Nursing note and vitals reviewed. Constitutional: He is oriented to person, place, and time. He appears well-developed. No distress.  HENT:  Head: Normocephalic and atraumatic.  Mouth/Throat: Oropharynx is clear and moist and mucous membranes are normal.   Eyes: Conjunctivae are normal. Pupils are equal, round, and reactive to light.  Cardiovascular: Normal rate and regular rhythm.  No murmur heard. Pulses:      Dorsalis pedis pulses are 2+ on the right side, and 2+ on the left side.    Respiratory: Effort normal and breath sounds normal. No respiratory distress.  GI: Soft. He exhibits no mass. There is no hepatomegaly. There is no tenderness.  Genitourinary:  Genitourinary Comments: Breast: Right upper outer quadrant about 2 cm nodular lesion,mobile,no tender. No nipple discharge. See CV imaging for details.  Musculoskeletal:  He exhibits no edema or tenderness.       Thoracic back: He exhibits no tenderness and no bony tenderness.       Lumbar back: He exhibits no tenderness and no bony tenderness.  Mild limitation left shouder abduction,pain elicited. Crepitus knees, R>L, no significant limitation of ROM,no pain elicited.  Under right breast and in rib cage palpable papular lesion, about 1 cm, mobile,no tender.  Lymphadenopathy:    He has no cervical adenopathy.  Neurological: He is alert and oriented to person, place, and time. He has normal strength. Gait normal.  Skin: Skin is warm. No rash noted. No erythema.  Psychiatric: His mood appears anxious. Cognition and memory are normal.  Well groomed, good eye contact.     ASSESSMENT AND PLAN:   [name] was seen today for 6 months follow-up.   Diagnoses and all orders for this visit:  Lab Results  Component Value Date   ALT 27 03/07/2017   AST 22 03/07/2017   GGT 29 11/02/2016   ALKPHOS 120 (H) 03/07/2017   BILITOT 0.8 03/07/2017   Lab Results  Component Value Date   CREATININE 1.04 03/07/2017   BUN 25 (H) 03/07/2017   NA 142 03/07/2017   K 4.4 03/07/2017   CL 109 03/07/2017   CO2 27 03/07/2017    Generalized osteoarthritis of multiple sites  In general problem seems to be well controlled with current management. No changes in Cymbalta 30  mg. If the pain he can take Acetaminophen 500 mg up to 2 g daily.  -     DULoxetine (CYMBALTA) 30 MG capsule; Take 1 capsule (30 mg total) by mouth daily.  Essential hypertension  Adequately controlled. No changes in current management. DASH diet recommended. Eye exam recommended annually. F/U in 6 months, before if needed.  -     Comprehensive metabolic panel  Breast mass, right  Possible etiologies discussed, could be benign and not related to breast tissue. Diagnostic mammogram was ordered, further recommendation will be given accordingly.  -     MM DIAG BREAST TOMO BILATERAL; Future  Lesion of subcutaneous tissue  Lesion palpated in right rib cage is very small, most likely been benign. ?  Lipoma, sebaceous cyst among some. We will continue monitoring.  Elevated alkaline phosphatase level  Mild. Reporting last PSA at his urologist office of 0. We will continue monitoring. Further recommendations will be given according to lab results.  -     Comprehensive metabolic panel -     Gamma GT     -Mr. Markham Timothey Dahlstrom was advised to return sooner than planned today if new concerns arise.       Alliene Klugh G. [name], MD  [facility]. [place] office.

## 2017-03-07 ENCOUNTER — Encounter: Payer: Self-pay | Admitting: Family Medicine

## 2017-03-07 ENCOUNTER — Ambulatory Visit: Payer: Medicare Other | Admitting: Family Medicine

## 2017-03-07 VITALS — BP 132/70 | HR 72 | Temp 97.9°F | Resp 12 | Ht 72.0 in | Wt 203.5 lb

## 2017-03-07 DIAGNOSIS — L989 Disorder of the skin and subcutaneous tissue, unspecified: Secondary | ICD-10-CM | POA: Diagnosis not present

## 2017-03-07 DIAGNOSIS — I10 Essential (primary) hypertension: Secondary | ICD-10-CM | POA: Diagnosis not present

## 2017-03-07 DIAGNOSIS — N6310 Unspecified lump in the right breast, unspecified quadrant: Secondary | ICD-10-CM | POA: Diagnosis not present

## 2017-03-07 DIAGNOSIS — M159 Polyosteoarthritis, unspecified: Secondary | ICD-10-CM | POA: Diagnosis not present

## 2017-03-07 DIAGNOSIS — R748 Abnormal levels of other serum enzymes: Secondary | ICD-10-CM | POA: Diagnosis not present

## 2017-03-07 LAB — COMPREHENSIVE METABOLIC PANEL
ALBUMIN: 4.1 g/dL (ref 3.5–5.2)
ALT: 27 U/L (ref 0–53)
AST: 22 U/L (ref 0–37)
Alkaline Phosphatase: 120 U/L — ABNORMAL HIGH (ref 39–117)
BUN: 25 mg/dL — ABNORMAL HIGH (ref 6–23)
CALCIUM: 10.1 mg/dL (ref 8.4–10.5)
CHLORIDE: 109 meq/L (ref 96–112)
CO2: 27 mEq/L (ref 19–32)
Creatinine, Ser: 1.04 mg/dL (ref 0.40–1.50)
GFR: 73.82 mL/min (ref >60.00)
Glucose, Bld: 113 mg/dL — ABNORMAL HIGH (ref 70–99)
POTASSIUM: 4.4 meq/L (ref 3.5–5.1)
Sodium: 142 mEq/L (ref 135–145)
Total Bilirubin: 0.8 mg/dL (ref 0.2–1.2)
Total Protein: 6.6 g/dL (ref 6.0–8.3)

## 2017-03-07 LAB — GAMMA GT: GGT: 30 U/L (ref 7–51)

## 2017-03-07 MED ORDER — DULOXETINE HCL 30 MG PO CPEP: 30.0000 mg | ORAL_CAPSULE | Freq: Every day | ORAL | 2 refills | Status: DC

## 2017-03-07 NOTE — Patient Instructions (Addendum)
A few things to remember from today's visit:   Essential hypertension - Plan: Comprehensive metabolic panel  Generalized osteoarthritis of multiple sites - Plan: DULoxetine (CYMBALTA) 30 MG capsule  Breast mass, right - Plan: MS DIGITAL DIAG TOMO BILAT  Lesion of subcutaneous tissue  Elevated alkaline phosphatase level - Plan: Comprehensive metabolic panel, Gamma GT  No changes today.   Please be sure medication list is accurate. If a new problem present, please set up appointment sooner than planned today.

## 2017-03-08 ENCOUNTER — Other Ambulatory Visit: Payer: Self-pay | Admitting: Family Medicine

## 2017-03-08 DIAGNOSIS — N6310 Unspecified lump in the right breast, unspecified quadrant: Secondary | ICD-10-CM

## 2017-03-12 ENCOUNTER — Encounter: Payer: Self-pay | Admitting: Family Medicine

## 2017-03-14 ENCOUNTER — Other Ambulatory Visit: Payer: Self-pay | Admitting: Family Medicine

## 2017-03-14 DIAGNOSIS — J309 Allergic rhinitis, unspecified: Secondary | ICD-10-CM

## 2017-03-17 ENCOUNTER — Telehealth: Payer: Self-pay | Admitting: Family Medicine

## 2017-03-17 NOTE — Telephone Encounter (Signed)
Patient referral was placed on 03/08/2017. Spoke with patient. Patient will call back on Tuesday if he hasn't heard anything from the imaging place.

## 2017-03-17 NOTE — Telephone Encounter (Signed)
Copied from [facility] [phone] [phone]. Topic: General - Other >> Mar 17, 2017  9:52 AM Carolyn Stare wrote:  Pt call to say that Dr [name] was suppose to be referring him to see someone a knot under right arm   [phone]

## 2017-03-27 ENCOUNTER — Ambulatory Visit
Admission: RE | Admit: 2017-03-27 | Discharge: 2017-03-27 | Disposition: A | Discharge status: Home / Self Care | Payer: Medicare Other | Source: Ambulatory Visit | Attending: Family Medicine | Admitting: Family Medicine

## 2017-03-27 DIAGNOSIS — N6311 Unspecified lump in the right breast, upper outer quadrant: Secondary | ICD-10-CM | POA: Diagnosis not present

## 2017-03-27 DIAGNOSIS — N6310 Unspecified lump in the right breast, unspecified quadrant: Secondary | ICD-10-CM

## 2017-03-27 DIAGNOSIS — R928 Other abnormal and inconclusive findings on diagnostic imaging of breast: Secondary | ICD-10-CM | POA: Diagnosis not present

## 2017-04-08 LAB — PSA: PSA: 0.047

## 2017-04-13 DIAGNOSIS — C61 Malignant neoplasm of prostate: Secondary | ICD-10-CM | POA: Diagnosis not present

## 2017-04-18 ENCOUNTER — Other Ambulatory Visit: Payer: Self-pay | Admitting: Family Medicine

## 2017-04-18 DIAGNOSIS — J309 Allergic rhinitis, unspecified: Secondary | ICD-10-CM

## 2017-04-20 DIAGNOSIS — C61 Malignant neoplasm of prostate: Secondary | ICD-10-CM | POA: Diagnosis not present

## 2017-04-20 DIAGNOSIS — N4340 Spermatocele of epididymis, unspecified: Secondary | ICD-10-CM | POA: Diagnosis not present

## 2017-04-20 DIAGNOSIS — N481 Balanitis: Secondary | ICD-10-CM | POA: Diagnosis not present

## 2017-04-20 DIAGNOSIS — D3001 Benign neoplasm of right kidney: Secondary | ICD-10-CM | POA: Diagnosis not present

## 2017-04-26 ENCOUNTER — Encounter: Payer: Self-pay | Admitting: Family Medicine

## 2017-04-27 DIAGNOSIS — M25562 Pain in left knee: Secondary | ICD-10-CM | POA: Diagnosis not present

## 2017-04-27 DIAGNOSIS — M25561 Pain in right knee: Secondary | ICD-10-CM | POA: Diagnosis not present

## 2017-06-29 ENCOUNTER — Other Ambulatory Visit: Payer: Self-pay | Admitting: Cardiology

## 2017-06-29 DIAGNOSIS — E7800 Pure hypercholesterolemia, unspecified: Secondary | ICD-10-CM

## 2017-06-29 NOTE — Telephone Encounter (Signed)
REFILL 

## 2017-07-13 ENCOUNTER — Encounter: Payer: Self-pay | Admitting: Gastroenterology

## 2017-07-19 ENCOUNTER — Other Ambulatory Visit: Payer: Self-pay | Admitting: Family Medicine

## 2017-07-19 DIAGNOSIS — K219 Gastro-esophageal reflux disease without esophagitis: Secondary | ICD-10-CM

## 2017-08-14 ENCOUNTER — Emergency Department (HOSPITAL_COMMUNITY): Payer: Medicare Other

## 2017-08-14 ENCOUNTER — Emergency Department (HOSPITAL_COMMUNITY)
Admission: EM | Admit: 2017-08-14 | Discharge: 2017-08-14 | Disposition: A | Discharge status: Home / Self Care | Payer: Medicare Other | Attending: Emergency Medicine | Admitting: Emergency Medicine

## 2017-08-14 ENCOUNTER — Other Ambulatory Visit: Payer: Self-pay

## 2017-08-14 DIAGNOSIS — Z7982 Long term (current) use of aspirin: Secondary | ICD-10-CM | POA: Diagnosis not present

## 2017-08-14 DIAGNOSIS — I10 Essential (primary) hypertension: Secondary | ICD-10-CM | POA: Diagnosis not present

## 2017-08-14 DIAGNOSIS — Z8546 Personal history of malignant neoplasm of prostate: Secondary | ICD-10-CM | POA: Insufficient documentation

## 2017-08-14 DIAGNOSIS — N202 Calculus of kidney with calculus of ureter: Secondary | ICD-10-CM | POA: Diagnosis not present

## 2017-08-14 DIAGNOSIS — N23 Unspecified renal colic: Secondary | ICD-10-CM | POA: Diagnosis not present

## 2017-08-14 DIAGNOSIS — R109 Unspecified abdominal pain: Secondary | ICD-10-CM | POA: Diagnosis present

## 2017-08-14 DIAGNOSIS — Z87891 Personal history of nicotine dependence: Secondary | ICD-10-CM | POA: Insufficient documentation

## 2017-08-14 DIAGNOSIS — Z79899 Other long term (current) drug therapy: Secondary | ICD-10-CM | POA: Insufficient documentation

## 2017-08-14 LAB — CBC WITH DIFFERENTIAL/PLATELET
BASOS ABS: 0 10*3/uL (ref 0.0–0.1)
BASOS PCT: 0 %
EOS PCT: 1 %
Eosinophils Absolute: 0.1 10*3/uL (ref 0.0–0.7)
HCT: 43.4 % (ref 39.0–52.0)
Hemoglobin: 14.8 g/dL (ref 13.0–17.0)
Lymphocytes Relative: 16 %
Lymphs Abs: 1.6 10*3/uL (ref 0.7–4.0)
MCH: 32.4 pg (ref 26.0–34.0)
MCHC: 34.1 g/dL (ref 30.0–36.0)
MCV: 95 fL (ref 78.0–100.0)
MONO ABS: 0.8 10*3/uL (ref 0.1–1.0)
Monocytes Relative: 8 %
Neutro Abs: 7.4 10*3/uL (ref 1.7–7.7)
Neutrophils Relative %: 75 %
PLATELETS: 181 10*3/uL (ref 150–400)
RBC: 4.57 MIL/uL (ref 4.22–5.81)
RDW: 13.6 % (ref 11.5–15.5)
WBC: 9.8 10*3/uL (ref 4.0–10.5)

## 2017-08-14 LAB — BASIC METABOLIC PANEL
ANION GAP: 10 (ref 5–15)
BUN: 36 mg/dL — ABNORMAL HIGH (ref 6–20)
CO2: 20 mmol/L — ABNORMAL LOW (ref 22–32)
Calcium: 10.8 mg/dL — ABNORMAL HIGH (ref 8.9–10.3)
Chloride: 113 mmol/L — ABNORMAL HIGH (ref 101–111)
Creatinine, Ser: 1.27 mg/dL — ABNORMAL HIGH (ref 0.61–1.24)
GFR calc Af Amer: >60 mL/min (ref >60)
GFR, EST NON AFRICAN AMERICAN: 53 mL/min — AB (ref >60)
GLUCOSE: 134 mg/dL — AB (ref 65–99)
Potassium: 3.9 mmol/L (ref 3.5–5.1)
Sodium: 143 mmol/L (ref 135–145)

## 2017-08-14 LAB — URINALYSIS, ROUTINE W REFLEX MICROSCOPIC
Bacteria, UA: NONE SEEN
Bilirubin Urine: NEGATIVE
GLUCOSE, UA: NEGATIVE mg/dL
KETONES UR: 80 mg/dL — AB
LEUKOCYTES UA: NEGATIVE
Nitrite: NEGATIVE
Protein, ur: NEGATIVE mg/dL
RBC / HPF: >50 RBC/hpf — ABNORMAL HIGH (ref 0–5)
Specific Gravity, Urine: 1.023 (ref 1.005–1.030)
pH: 6 (ref 5.0–8.0)

## 2017-08-14 MED ORDER — ONDANSETRON HCL 4 MG/2ML IJ SOLN
4.0000 mg | Freq: Once | INTRAMUSCULAR | Status: AC
  Administered 2017-08-14: 4 mg via INTRAVENOUS
  Filled 2017-08-14: qty 2

## 2017-08-14 MED ORDER — TAMSULOSIN HCL 0.4 MG PO CAPS: 0.4000 mg | ORAL_CAPSULE | Freq: Every day | ORAL | 0 refills | Status: DC

## 2017-08-14 MED ORDER — ONDANSETRON 4 MG PO TBDP: 4.0000 mg | ORAL_TABLET | Freq: Three times a day (TID) | ORAL | 0 refills | Status: DC | PRN

## 2017-08-14 MED ORDER — HYDROCODONE-ACETAMINOPHEN 5-325 MG PO TABS
1.0000 | ORAL_TABLET | Freq: Once | ORAL | Status: AC
Start: 2017-08-14 — End: 2017-08-14
  Administered 2017-08-14: 1 via ORAL
  Filled 2017-08-14: qty 1

## 2017-08-14 MED ORDER — HYDROMORPHONE HCL 1 MG/ML IJ SOLN
1.0000 mg | Freq: Once | INTRAMUSCULAR | Status: AC
Start: 2017-08-14 — End: 2017-08-14
  Administered 2017-08-14: 1 mg via INTRAVENOUS
  Filled 2017-08-14: qty 1

## 2017-08-14 MED ORDER — HYDROCODONE-ACETAMINOPHEN 5-325 MG PO TABS: 1.0000 | ORAL_TABLET | ORAL | 0 refills | Status: DC | PRN

## 2017-08-14 MED ORDER — SODIUM CHLORIDE 0.9 % IV BOLUS
1000.0000 mL | Freq: Once | INTRAVENOUS | Status: AC
  Administered 2017-08-14: 1000 mL via INTRAVENOUS

## 2017-08-14 NOTE — ED Notes (Signed)
Pt tolerating fluids with no difficulty  

## 2017-08-14 NOTE — ED Triage Notes (Signed)
Left flank pain since 2230 pm last night has hx of kidney stones vomiting and retching in triage.

## 2017-08-14 NOTE — ED Provider Notes (Signed)
[facility] DEPT Provider Note   CSN: [id number] Arrival date & time: 08/14/17  0110     History   Chief Complaint Chief Complaint  Patient presents with  . Flank Pain    HPI David [name] is a 77 y.o. male.  Patient presents with severe left flank pain associated with nausea and vomiting since 10 PM.  Reports the pain is been coming and going in intensity but never going away.  Pain radiates to his left abdomen.  Said multiple episodes of vomiting and now dry heaving.  Denies any pain with urination or blood in the urine.  Denies any fever.  Does have a history of kidney stones but has not had problems for 15 years.  No chest pain or shortness of breath.  The history is provided by the patient.  Flank Pain  Pertinent negatives include no chest pain, no abdominal pain, no headaches and no shortness of breath.    Past Medical History:  Diagnosis Date  . Arthritis    gen.  and left hip  . BPH (benign prostatic hypertrophy)   . GERD (gastroesophageal reflux disease)    occasional  . History of kidney stones    2007  . Hyperlipidemia   . Hypertension   . Kidney tumor   . LBP (low back pain) 06/2005   L5 radicular symptoms; MRI of LS spine severe spondylosis at L4-L5 with central cancal stenosis   . Nocturia   . Organic impotence   . Prostate cancer ([place]) 10/2011   (Low grade) [facility] - Dr. Junious Silk  . Pyelonephritis 03/05/2013  . Renal oncocytoma of right kidney 01/23/2013   As 3.2 x 3.5 cm enhancing exophytic mass projecting off the lower pole. This is consistent with a renal cell carcinoma. No retroperitoneal lymphadenopathy.   [name] Spermatocele   . 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 Wears glasses     Patient Active Problem List   Diagnosis Date Noted  . Allergic rhinitis 11/18/2015  . Generalized osteoarthritis of multiple sites 08/06/2015  . GERD (gastroesophageal reflux disease) 06/25/2015  . Pulmonary nodule 07/05/2013  . Other malaise  and fatigue 06/07/2013  . Blood loss anemia 03/18/2013  . Right retroperitoneal urinoma with ureteral leak 03/05/2013  . Renal oncocytoma of right kidney 01/23/2013  . Nocturia 01/11/2013  . Left hip pain 03/05/2012  . Hyperglycemia 06/08/2011  . Prostate cancer ([place]) 05/04/2010  . HERNIA, UMBILICAL 61/60/7371  . ERECTILE DYSFUNCTION, ORGANIC 03/10/2009  . Hyperlipidemia 03/06/2008  . Essential hypertension 03/06/2008    Past Surgical History:  Procedure Laterality Date  . COLONOSCOPY  02/15/2006, 2014   Internal hemorrhoids (Dr [name]), last in 2014  . INGUINAL HERNIA REPAIR Right 1980  . LYMPHADENECTOMY Bilateral 02/15/2013   Procedure: LYMPHADENECTOMY WITH INDOCYANINE GREEN DYE INJECTION;  Surgeon: [name], MD;  Location: [facility];  Service: Urology;  Laterality: Bilateral;  . PROSTATE BIOPSY N/A 12/18/2012   Procedure: BIOPSY TRANSRECTAL ULTRASONIC PROSTATE (TUBP);  Surgeon: [name], MD;  Location: [facility];  Service: Urology;  Laterality: N/A;  . PROSTATE BIOPSY  12/05/11   gleason 6, 3/12 cores  . REPAIR UMBILICAL AND VENTRAL HERNIA'S W/ MESH  08-21-2009  . ROBOT ASSISTED LAPAROSCOPIC RADICAL PROSTATECTOMY N/A 02/15/2013   Procedure: ROBOTIC ASSISTED LAPAROSCOPIC RADICAL PROSTATECTOMY;  Surgeon: [name], MD;  Location: [facility];  Service: Urology;  Laterality: N/A;  . ROBOTIC ASSITED PARTIAL NEPHRECTOMY Right 02/15/2013   Procedure: ROBOTIC ASSITED PARTIAL NEPHRECTOMY;  Surgeon: [name],  MD;  Location: [facility];  Service: Urology;  Laterality: Right;  . SPERMATOCELECTOMY Left 12/18/2012   Procedure: LEFT SPERMATOCELECTOMY;  Surgeon: [name], MD;  Location: [facility];  Service: Urology;  Laterality: Left;  . TONSILLECTOMY AND ADENOIDECTOMY  as child  . TRANSURETHRAL RESECTION OF PROSTATE  AGE 14        Home Medications    Prior to Admission medications   Medication Sig Start Date End  Date Taking? Authorizing Provider  amLODipine-olmesartan (AZOR) 5-20 MG tablet TAKE 1 TABLET BY MOUTH ONCE DAILY 06/29/17   Lelon Perla, MD  aspirin EC 81 MG tablet Take 81 mg by mouth daily.    [provider]  atorvastatin (LIPITOR) 40 MG tablet TAKE 1 TABLET BY MOUTH ONCE DAILY AT 6:00 PM 06/29/17   Lelon Perla, MD  cetirizine (ZYRTEC) 10 MG tablet Take 1 tablet (10 mg total) by mouth daily. 11/18/15   [name], [name] G, MD  DULoxetine (CYMBALTA) 30 MG capsule Take 1 capsule (30 mg total) by mouth daily. 03/07/17   [name], [name] G, MD  fluticasone [facility]) 50 MCG/ACT nasal spray SHAKE LIQUID AND USE 2 SPRAYS IN [facility] NOSTRIL EVERY DAY 04/18/17   [name], [name] G, MD  omeprazole (PRILOSEC) 20 MG capsule TAKE 1 CAPSULE BY MOUTH ONCE DAILY BEFORE BREAKFAST 07/20/17   [name], [name] G, MD  sodium chloride (OCEAN) 0.65 % nasal spray Place 1 spray into the nose daily as needed for congestion.    [provider]    Family History Family History  Problem Relation Age of Onset  . Cancer Mother        colon  . Colon cancer Mother 98  . Pulmonary fibrosis Father        father owned a Radio broadcast assistant (father blamed inhalation of chemicals and fumes)  . Pulmonary fibrosis Brother   . Coronary artery disease Other   . Hyperlipidemia Other     Social History Social History   Tobacco Use  . Smoking status: Former Smoker    Packs/day: 0.25    Years: 10.00    Pack years: 2.50    Types: Cigarettes    Last attempt to quit: 02/28/1981    Years since quitting: 36.4  . Smokeless tobacco: Never Used  Substance Use Topics  . Alcohol use: Yes    Comment: occasional  . Drug use: No     Allergies   Ace inhibitors; Codeine; and Percocet [oxycodone-acetaminophen]   Review of Systems Review of Systems  Constitutional: Negative for activity change and appetite change.  HENT: Negative for congestion and rhinorrhea.   Eyes: Negative for visual disturbance.  Respiratory: Negative  for cough, chest tightness and shortness of breath.   Cardiovascular: Negative for chest pain.  Gastrointestinal: Positive for nausea. Negative for abdominal pain, diarrhea and vomiting.  Genitourinary: Positive for dysuria, flank pain and urgency. Negative for hematuria and testicular pain.  Musculoskeletal: Positive for back pain.  Skin: Negative for rash.  Neurological: Negative for dizziness, weakness and headaches.   all other systems are negative except as noted in the HPI and PMH.     Physical Exam Updated Vital Signs BP (!) 167/65 (BP Location: Left Arm)   Pulse (!) 52   Temp (!) 97.5 F (36.4 C) (Oral)   Resp (!) 22   Ht 6' (1.829 m)   Wt 88.5 kg (195 lb)   SpO2 99%   BMI 26.45 kg/m   Physical Exam  Constitutional: He  is oriented to person, place, and time. He appears well-developed and well-nourished. He appears distressed.  Uncomfortable and dry heaving  HENT:  Head: Normocephalic and atraumatic.  Mouth/Throat: Oropharynx is clear and moist. No oropharyngeal exudate.  Eyes: Pupils are equal, round, and reactive to light. Conjunctivae and EOM are normal.  Neck: Normal range of motion. Neck supple.  No meningismus.  Cardiovascular: Normal rate, regular rhythm, normal heart sounds and intact distal pulses.  No murmur heard. Pulmonary/Chest: Effort normal and breath sounds normal. No respiratory distress.  Abdominal: Soft. There is tenderness. There is no rebound and no guarding.  Left flank tenderness  Genitourinary:  Genitourinary Comments: Testicles nontender  Musculoskeletal: Normal range of motion. He exhibits no edema or tenderness.  No CVA tenderness  Neurological: He is alert and oriented to person, place, and time. No cranial nerve deficit. He exhibits normal muscle tone. Coordination normal.  No ataxia on finger to nose bilaterally. No pronator drift. 5/5 strength throughout. CN 2-12 intact.Equal grip strength. Sensation intact.   Skin: Skin is warm.    Psychiatric: He has a normal mood and affect. His behavior is normal.  Nursing note and vitals reviewed.    ED Treatments / Results  Labs (all labs ordered are listed, but only abnormal results are displayed) Labs Reviewed  BASIC METABOLIC PANEL - Abnormal; Notable for the following components:      Result Value   Chloride 113 (*)    CO2 20 (*)    Glucose, Bld 134 (*)    BUN 36 (*)    Creatinine, Ser 1.27 (*)    Calcium 10.8 (*)    GFR calc non Af Amer 53 (*)    All other components within normal limits  URINALYSIS, ROUTINE W REFLEX MICROSCOPIC - Abnormal; Notable for the following components:   Hgb urine dipstick MODERATE (*)    Ketones, ur 80 (*)    RBC / HPF >50 (*)    All other components within normal limits  URINE CULTURE  CBC WITH DIFFERENTIAL/PLATELET    EKG None  Radiology Ct Renal Stone Study  Result Date: 08/14/2017 CLINICAL DATA:  Left flank pain since 20/2 30 hours on 08/13/2017. Microhematuria. Previous history of hypertension, kidney stones, right partial nephrectomy due to cancer, pyelonephritis, prostate cancer. EXAM: CT ABDOMEN AND PELVIS WITHOUT CONTRAST TECHNIQUE: Multidetector CT imaging of the abdomen and pelvis was performed following the standard protocol without IV contrast. COMPARISON:  01/28/2014 FINDINGS: Lower chest: Infiltration or atelectasis in both lung bases, greater on the right. Small to moderate esophageal hiatal hernia. Coronary artery calcifications. Hepatobiliary: No focal liver abnormality is seen. No gallstones, gallbladder wall thickening, or biliary dilatation. Pancreas: Unremarkable. No pancreatic ductal dilatation or surrounding inflammatory changes. Spleen: Normal in size without focal abnormality. Adrenals/Urinary Tract: No adrenal gland nodules. 4 mm stone in the left ureteropelvic junction with mild left pyelocaliectasis. Distal left ureter is decompressed. Additional nonobstructing intrarenal stones in both kidneys, largest in the  left upper pole measuring 7 mm diameter. No hydronephrosis or hydroureter on the right. Bladder wall is not thickened. No bladder filling defects. Stomach/Bowel: Stomach is within normal limits. Appendix appears normal. No evidence of bowel wall thickening, distention, or inflammatory changes. Vascular/Lymphatic: Aortic atherosclerosis. No enlarged abdominal or pelvic lymph nodes. Reproductive: Surgical absence of the prostate gland. Other: Postoperative scarring in the anterior abdominal wall. No free air or free fluid in the abdomen. Musculoskeletal: Degenerative changes in the spine. No destructive bone lesions. IMPRESSION: 1. 4 mm stone in  the left ureteropelvic junction with mild proximal obstruction. 2. Multiple additional nonobstructing intrarenal stones bilaterally. 3. Infiltration or atelectasis in both lungs, greater on the right. 4. Small to moderate esophageal hiatal hernia. Electronically Signed   By: Lucienne Capers M.D.   On: 08/14/2017 04:51    Procedures Procedures (including critical care time)  Medications Ordered in ED Medications  sodium chloride 0.9 % bolus 1,000 mL (has no administration in time range)  HYDROmorphone (DILAUDID) injection 1 mg (has no administration in time range)  ondansetron (ZOFRAN) injection 4 mg (has no administration in time range)     Initial Impression / Assessment and Plan / ED Course  I have reviewed the triage vital signs and the nursing notes.  Pertinent labs & imaging results that were available during my care of the patient were reviewed by me and considered in my medical decision making (see chart for details).    Patient with flank pain and nausea vomiting with concern for possible kidney stone.  No fever.  Urinalysis shows blood without infection.  Large ketones.  Culture sent.  CT scan shows proximal left ureteral stone Patient does have slight bump in creatinine to 1.27.  He is aggressively hydrated in the ED.  On reassessment,  pain is controlled and patient is no longer vomiting.  He wishes to go home.  Follow up with urology. Return to the ED with worsening pain, vomiting, fever, or any other concerns.  Final Clinical Impressions(s) / ED Diagnoses   Final diagnoses:  Ureteral colic    ED Discharge Orders    None       Thersea Manfredonia, Annie Main, MD 08/14/17 [phone]

## 2017-08-14 NOTE — ED Notes (Signed)
Bed: WA17 Expected date:  Expected time:  Means of arrival:  Comments: 

## 2017-08-14 NOTE — Discharge Instructions (Addendum)
Take the pain medication and nausea medication as prescribed.  Follow-up with the urologist for an appointment as soon as possible.  Return to the ED if you have worsening pain, fever, vomiting or other concerns.

## 2017-08-15 LAB — URINE CULTURE: Culture: <10000 — AB

## 2017-08-17 DIAGNOSIS — N201 Calculus of ureter: Secondary | ICD-10-CM | POA: Diagnosis not present

## 2017-08-18 ENCOUNTER — Other Ambulatory Visit: Payer: Self-pay | Admitting: Family Medicine

## 2017-08-18 DIAGNOSIS — J309 Allergic rhinitis, unspecified: Secondary | ICD-10-CM

## 2017-08-21 DIAGNOSIS — N201 Calculus of ureter: Secondary | ICD-10-CM | POA: Diagnosis not present

## 2017-09-16 ENCOUNTER — Other Ambulatory Visit: Payer: Self-pay | Admitting: Cardiology

## 2017-09-16 DIAGNOSIS — E7800 Pure hypercholesterolemia, unspecified: Secondary | ICD-10-CM

## 2017-09-18 DIAGNOSIS — L304 Erythema intertrigo: Secondary | ICD-10-CM | POA: Diagnosis not present

## 2017-09-18 DIAGNOSIS — L821 Other seborrheic keratosis: Secondary | ICD-10-CM | POA: Diagnosis not present

## 2017-09-18 DIAGNOSIS — H61001 Unspecified perichondritis of right external ear: Secondary | ICD-10-CM | POA: Diagnosis not present

## 2017-09-18 DIAGNOSIS — L570 Actinic keratosis: Secondary | ICD-10-CM | POA: Diagnosis not present

## 2017-09-18 DIAGNOSIS — Z85828 Personal history of other malignant neoplasm of skin: Secondary | ICD-10-CM | POA: Diagnosis not present

## 2017-09-25 NOTE — Progress Notes (Signed)
Subjective:   David [name] is a 77 y.o. male who presents for Medicare Annual/Subsequent preventive examination.  Reports health as good  BMI 27  5 weeks ago had a kidney stone Dec 2014 had robotic removal of prostate  PSA  Followed by UR  2 dtr 5 grands and 5 great grands   Wife had hip replacement feb of this year  He has seen the PA Bone on bone with the right knee  Hx of injury to 88 to patella  Dr. [name] Not ready for surgery as yet  Diet- Tries to eat healthy, am cereal and fruit Sundays; eggs and bacon and grits Mini bagel Lunch; salads for [employer] tries to [employer] and veg; sweet potatoes Air fryer   Exercise Does all the yard work; trims trees, does al the aunts wall   Health Maintenance Due  Topic Date Due  . HEMOGLOBIN A1C  03/12/2017  . COLONOSCOPY  07/18/2017  . FOOT EXAM  09/23/2017   Discussed his "hyperglycemia" with normal A1c  Will make an apt with Dr. [name] and will repeat labs as appropriate   Eye exam -will see Dr. Susanne Greenhouse son  Foot exam- no issues Diabetic Foot Exam - Simple   Simple Foot Form Diabetic Foot exam was performed with the following findings:  Yes 09/26/2017  3:36 PM  Visual Inspection No deformities, no ulcerations, no other skin breakdown bilaterally:  Yes Sensation Testing Intact to touch and monofilament testing bilaterally:  Yes Pulse Check Posterior Tibialis and Dorsalis pulse intact bilaterally:  Yes Comments Pulses are all 2+  Feet warm and dry  Color  No issues calluses or other       Colonoscopy 08-08-2012 due 08-Aug-2017 Mother died at 51 of colon cancer  Asked Mr. Bruntz to call the GI center to confirm decision on repeats. Repeats may continue due to the hx of colon cancer with his mother.   Mammogram 02/2017  Benign cyst   Has annual check with dermatologist Dr. Jarome Matin  Cardiac Risk Factors include: advanced age (>77mn, >>26women);family history of premature cardiovascular  disease;hypertension;male gender     Objective:    Vitals: BP 136/70   Pulse 62   Ht 6' (1.829 m)   Wt 199 lb (90.3 kg)   SpO2 96%   BMI 26.99 kg/m   Body mass index is 26.99 kg/m.  Advanced Directives 09/26/2017 09/23/2016 07/01/2015 03/03/2013 02/15/2013 02/12/2013 12/18/2012  Does Patient Have a Medical Advance Directive? Yes Yes Yes Patient has advance directive, copy not in chart Patient has advance directive, copy not in chart Patient has advance directive, copy not in chart Patient has advance directive, copy not in chart  Type of Advance Directive - Living will;Healthcare Power of [place] Healthcare Power of [place] Living will;Healthcare Power of Attorney  Does patient want to make changes to medical advance directive? - - No - Patient declined - - - -  Copy of [place] Chart? - No - copy requested No - copy requested Copy requested from family Copy requested from family Copy requested from family -  Pre-existing out of facility DNR order (yellow form or pink MOST form) - - - No No No No    Tobacco Social History   Tobacco Use  Smoking Status Former Smoker  . Packs/day: 0.25  . Years: 10.00  . Pack years: 2.50  .  Types: Cigarettes  . Last attempt to quit: 02/28/1981  . Years since quitting: 36.6  Smokeless Tobacco Never Used     Counseling given: Yes   Clinical Intake:    Past Medical History:  Diagnosis Date  . Arthritis    gen.  and left hip  . BPH (benign prostatic hypertrophy)   . GERD (gastroesophageal reflux disease)    occasional  . History of kidney stones    2007  . Hyperlipidemia   . Hypertension   . Kidney tumor   . LBP (low back pain) 06/2005   L5 radicular symptoms; MRI of LS spine severe spondylosis at L4-L5 with central cancal stenosis   . Nocturia   . Organic impotence   . Prostate cancer ([place]) 10/2011   (Low  grade) [facility] - Dr. Junious Silk  . Pyelonephritis 03/05/2013  . Renal oncocytoma of right kidney 01/23/2013   As 3.2 x 3.5 cm enhancing exophytic mass projecting off the lower pole. This is consistent with a renal cell carcinoma. No retroperitoneal lymphadenopathy.   [name] Spermatocele   . 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 Wears glasses    Past Surgical History:  Procedure Laterality Date  . COLONOSCOPY  02/15/2006, 2014   Internal hemorrhoids (Dr [name]), last in 2014  . INGUINAL HERNIA REPAIR Right 1980  . LYMPHADENECTOMY Bilateral 02/15/2013   Procedure: LYMPHADENECTOMY WITH INDOCYANINE GREEN DYE INJECTION;  Surgeon: [name], MD;  Location: [facility];  Service: Urology;  Laterality: Bilateral;  . PROSTATE BIOPSY N/A 12/18/2012   Procedure: BIOPSY TRANSRECTAL ULTRASONIC PROSTATE (TUBP);  Surgeon: [name], MD;  Location: [facility];  Service: Urology;  Laterality: N/A;  . PROSTATE BIOPSY  12/05/11   gleason 6, 3/12 cores  . REPAIR UMBILICAL AND VENTRAL HERNIA'S W/ MESH  08-21-2009  . ROBOT ASSISTED LAPAROSCOPIC RADICAL PROSTATECTOMY N/A 02/15/2013   Procedure: ROBOTIC ASSISTED LAPAROSCOPIC RADICAL PROSTATECTOMY;  Surgeon: [name], MD;  Location: [facility];  Service: Urology;  Laterality: N/A;  . ROBOTIC ASSITED PARTIAL NEPHRECTOMY Right 02/15/2013   Procedure: ROBOTIC ASSITED PARTIAL NEPHRECTOMY;  Surgeon: [name], MD;  Location: [facility];  Service: Urology;  Laterality: Right;  . SPERMATOCELECTOMY Left 12/18/2012   Procedure: LEFT SPERMATOCELECTOMY;  Surgeon: [name], MD;  Location: [facility];  Service: Urology;  Laterality: Left;  . TONSILLECTOMY AND ADENOIDECTOMY  as child  . TRANSURETHRAL RESECTION OF PROSTATE  AGE 44   Family History  Problem Relation Age of Onset  . Cancer Mother        colon  . Colon cancer Mother 4  . Pulmonary fibrosis Father        father owned a Radio broadcast assistant (father blamed  inhalation of chemicals and fumes)  . Pulmonary fibrosis Brother   . Coronary artery disease Other   . Hyperlipidemia Other    Social History   Socioeconomic History  . Marital status: Married    Spouse name: Not on file  . Number of children: 2  . Years of education: Not on file  . Highest education level: Not on file  Occupational History  . Not on file  Social Needs  . Financial resource strain: Not on file  . Food insecurity:    Worry: Not on file    Inability: Not on file  . Transportation needs:    Medical: Not on file    Non-medical: Not on file  Tobacco Use  . Smoking status: Former Smoker    Packs/day: 0.25  Years: 10.00    Pack years: 2.50    Types: Cigarettes    Last attempt to quit: 02/28/1981    Years since quitting: 36.6  . Smokeless tobacco: Never Used  Substance and Sexual Activity  . Alcohol use: Yes    Comment: occasional  . Drug use: Yes    Types: Flunitrazepam  . Sexual activity: Not on file  Lifestyle  . Physical activity:    Days per week: Not on file    Minutes per session: Not on file  . Stress: Not on file  Relationships  . Social connections:    Talks on phone: Not on file    Gets together: Not on file    Attends religious service: Not on file    Active member of club or organization: Not on file    Attends meetings of clubs or organizations: Not on file    Relationship status: Not on file  Other Topics Concern  . Not on file  Social History Narrative   Married- 45 yrs   2 daughters   5 grandchildren   Never Smoked   Alcohol use-yes (occasional/social)   Retired- Engineer, water      Physician roster:      Urologist - Dr. [name]   Orthopedics - Dr. [name]   Dermatology - Dr. [name]    Outpatient Encounter Medications as of 09/26/2017  Medication Sig  . amLODipine-olmesartan (AZOR) 5-20 MG tablet TAKE 1 TABLET BY MOUTH ONCE DAILY  . aspirin EC 81 MG tablet Take 81 mg by mouth daily.  [name] atorvastatin (LIPITOR) 40 MG  tablet TAKE 1 TABLET BY MOUTH ONCE DAILY AT 6:00 PM  . cetirizine (ZYRTEC) 10 MG tablet Take 1 tablet (10 mg total) by mouth daily.  . DULoxetine (CYMBALTA) 30 MG capsule Take 1 capsule (30 mg total) by mouth daily.  . fluticasone (FLONASE) 50 MCG/ACT nasal spray SHAKE LIQUID AND USE 2 SPRAYS IN EACH NOSTRIL EVERY DAY  . omeprazole (PRILOSEC) 20 MG capsule TAKE 1 CAPSULE BY MOUTH ONCE DAILY BEFORE BREAKFAST  . sodium chloride (OCEAN) 0.65 % nasal spray Place 1 spray into the nose daily as needed for congestion.  [name] HYDROcodone-acetaminophen (NORCO/VICODIN) 5-325 MG tablet Take 1 tablet by mouth every 4 (four) hours as needed. (Patient not taking: Reported on 09/26/2017)  . ondansetron (ZOFRAN ODT) 4 MG disintegrating tablet Take 1 tablet (4 mg total) by mouth every 8 (eight) hours as needed for nausea or vomiting. (Patient not taking: Reported on 09/26/2017)  . tamsulosin (FLOMAX) 0.4 MG CAPS capsule Take 1 capsule (0.4 mg total) by mouth daily. (Patient not taking: Reported on 09/26/2017)   No facility-administered encounter medications on file as of 09/26/2017.     Activities of Daily Living In your present state of health, do you have any difficulty performing the following activities: 09/26/2017  Hearing? N  Vision? N  Difficulty concentrating or making decisions? N  Comment will lose things or misplace things  Walking or climbing stairs? N  Dressing or bathing? N  Doing errands, shopping? N  Preparing Food and eating ? N  Using the Toilet? N  In the past six months, have you accidently leaked urine? Y  Comment due to surgery  Do you have problems with loss of bowel control? N  Managing your Medications? N  Managing your Finances? N  Housekeeping or managing your Housekeeping? N  Some recent data might be hidden    Patient Care Team: [name], [name] G, MD  as PCP - General (Family Medicine) Stanford Breed, Denice Bors, MD as Consulting Physician (Cardiology) [facility], [facility] Jarome Matin, MD as Consulting Physician (Dermatology)   Assessment:   This is a routine wellness examination for David [name].  Exercise Activities and Dietary recommendations Current Exercise Habits: Home exercise routine, Type of exercise: walking;strength training/weights(yard work), Time (Minutes): 45, Frequency (Times/Week): 4, Weekly Exercise (Minutes/Week): 180, Intensity: Moderate  Goals    . Patient Stated     Maintain your weight to <200lbs    . Weight (lb) < 200 lb (90.7 kg)     Maintain weight well below 200 lbs. Watch diet and be as active as possible.        Fall Risk Fall Risk  09/26/2017 09/23/2016 06/25/2015 05/21/2014 03/18/2013  Falls in the past year? No No No No No     Depression Screen PHQ 2/9 Scores 09/26/2017 09/23/2016 06/25/2015 05/21/2014  PHQ - 2 Score 0 0 0 0    Cognitive Function MMSE - Mini Mental State Exam 09/26/2017  Not completed: (No Data)   Ad8 score reviewed for issues:  Issues making decisions:  Less interest in hobbies / activities:  Repeats questions, stories (family complaining):  Trouble using ordinary gadgets (microwave, computer, phone):  Forgets the month or year:   Mismanaging finances:   Remembering appts:  Daily problems with thinking and/or memory: Ad8 score is=0          Immunization History  Administered Date(s) Administered  . Influenza Split 11/18/2010, 11/23/2011  . Influenza Whole 11/27/2007, 11/26/2008, 11/20/2009  . Influenza, High Dose Seasonal PF 11/24/2014, 11/18/2015, 12/12/2016  . Influenza,inj,Quad PF,6+ Mos 11/23/2012, 11/15/2013  . Pneumococcal Conjugate-13 11/15/2013  . Pneumococcal Polysaccharide-23 03/06/2008  . Td 03/06/2008  . Zoster Recombinat (Shingrix) 07/30/2017      Screening Tests Health Maintenance  Topic Date Due  . HEMOGLOBIN A1C  03/12/2017  . COLONOSCOPY  07/18/2017  . FOOT EXAM  09/23/2017  . OPHTHALMOLOGY EXAM  11/27/2017 (Originally 08/28/2016)  . INFLUENZA  VACCINE  09/28/2017  . TETANUS/TDAP  03/06/2018  . PNA vac Low Risk Adult  Completed         Plan:      PCP Notes   Health Maintenance Discussed his "hyperglycemia" with normal A1c  Will make an apt with Dr. [name] and will repeat labs as appropriate   Eye exam -will see Dr. Susanne Greenhouse son  Foot exam- no issues  Colonoscopy 08/20/2012 due 08/20/2017 Mother died at 56 of colon cancer  Asked Mr. Hechler to call the GI center to confirm decision on repeats. Repeats may continue due to the hx of colon cancer with his mother.   Mammogram 02/2017  Benign cyst   Has annual check with dermatologist Dr. Jarome Matin  Educated regarding shingrix   Abnormal Screens  none  Referrals  none  Patient concerns; As noted   Nurse Concerns; He is + for smoking hx;  CT of abd done 2015; with no notes regarding AAA. Would consider this guidelines met but now is 77 yo  Pulmonary nodule 2018 compared to 2015; no  Further CTs recommended   Next PCP apt TBS for 6 month fup 08/07   I have personally reviewed and noted the following in the patient's chart:   . Medical and social history . Use of alcohol, tobacco or illicit drugs  . Current medications and supplements . Functional ability and status . Nutritional status . Physical activity . Advanced directives . List of other physicians .  Hospitalizations, surgeries, and ER visits in previous 12 months . Vitals . Screenings to include cognitive, depression, and falls . Referrals and appointments  In addition, I have reviewed and discussed with patient certain preventive protocols, quality metrics, and best practice recommendations. A written personalized care plan for preventive services as well as general preventive health recommendations were provided to patient.     Wynetta Fines, RN  09/26/2017

## 2017-09-26 ENCOUNTER — Ambulatory Visit (INDEPENDENT_AMBULATORY_CARE_PROVIDER_SITE_OTHER): Payer: Medicare Other

## 2017-09-26 VITALS — BP 136/70 | HR 62 | Ht 72.0 in | Wt 199.0 lb

## 2017-09-26 DIAGNOSIS — Z0000 Encounter for general adult medical examination without abnormal findings: Secondary | ICD-10-CM | POA: Diagnosis not present

## 2017-09-26 NOTE — Patient Instructions (Addendum)
[name] , Thank you for taking time to come for your Medicare Wellness Visit. I appreciate your ongoing commitment to your health goals. Please review the following plan we discussed and let me know if I can assist you in the future.   Please call Dr. [name] office and fup on the due of your colonoscopy.   Will make an apt to see Dr. [name] for your 6 month labs and fup  She will discuss Hyperglycemia vs pre diabetes  When you have your blood work next time, please be sure your are NPO; have water or black coffee only   Shingrix is a vaccine for the prevention of Shingles in Adults 50 and older.  If you are on Medicare, the shingrix is covered under your Part D plan, so you will take both of the vaccines in the series at your pharmacy. Please check with your benefits regarding applicable copays or out of pocket expenses.  The Shingrix is given in 2 vaccines approx 8 weeks apart. You must receive the 2nd dose prior to 6 months from receipt of the first. Please have the pharmacist print out you Immunization  dates for our office records    These are the goals we discussed: Goals    . Weight (lb) < 200 lb (90.7 kg)     Maintain weight well below 200 lbs. Watch diet and be as active as possible.        This is a list of the screening recommended for you and due dates:  Health Maintenance  Topic Date Due  . Eye exam for diabetics  08/28/2016  . Hemoglobin A1C  03/12/2017  . Colon Cancer Screening  07/18/2017  . Complete foot exam   09/23/2017  . Flu Shot  09/28/2017  . Tetanus Vaccine  03/06/2018  . Pneumonia vaccines  Completed      Screening for Type 2 Diabetes A screening test for type 2 diabetes (type 2 diabetes mellitus) is a blood test to measure your blood sugar (glucose) level. This test is done to check for early signs of diabetes, before you develop symptoms. Type 2 diabetes is a long-term (chronic) disease that occurs when the pancreas does not make enough of a  hormone called insulin. This results in high blood glucose levels, which can cause many complications. You may be screened for type 2 diabetes as part of your regular health care, especially if you have a high risk for diabetes. Screening can help identify type 2 diabetes at its early stage (prediabetes). Identifying and treating prediabetes may delay or prevent development of type 2 diabetes. What are the risk factors for type 2 diabetes? The following factors may make you more likely to develop type 2 diabetes:  Having a parent or sibling (first-degree relative) who has diabetes.  Being overweight or obese.  Being of American-Indian, [place], Hispanic, Latino, Asian, or African-American descent.  Not getting enough exercise.  Being older than 76.  Having a history of diabetes during pregnancy (gestational diabetes).  Having low levels of good cholesterol (HDL-C) or high levels of blood fats (triglycerides).  Having high blood glucose in a previous blood test.  Having high blood pressure.  Having certain diseases or conditions, including: ? Acanthosis nigricans. This is a condition that causes dark skin on the neck, armpits, and groin. ? Polycystic ovary syndrome (PCOS). ? Heart disease.  Having delivered a baby who weighed more than 9 lb (4.1 kg).  Who should be screened for type  2 diabetes? Adults  Adults age 32 and older. These adults should be screened at least once every three years.  Adults who are younger than 2, overweight, and have at least one other risk factor. These adults should be screened at least once every three years.  Adults who have normal blood glucose levels and two or more risk factors. These adults may be screened once every year (annually).  Women who have had gestational diabetes in the past. These women should be screened at least once every three years.  Pregnant women who have risk factors. These women should be screened at their first  prenatal visit.  Pregnant women with no risk factors. These women should be screened between weeks 24 and 28 of pregnancy. Children and adolescents  Children and adolescents should be screened for type 2 diabetes if they are overweight and have 2 of the following risk factors: ? A family history of type 2 diabetes. ? Being a member of a high risk race or ethnic group. ? Signs of insulin resistance or conditions associated with insulin resistance. ? A mother who had gestational diabetes while pregnant with him or her.  Screening should be done at least once every three years, starting at age 24. Your health care provider or your child's health care provider may recommend having a screening more or less often. What happens during screening? During screening, your health care provider may ask questions about:  Your health and your risk factors, including your activity level and any medical conditions that you have.  The health of your first-degree relatives.  Past pregnancies, if this applies.  Your health care provider will also do a physical exam, including a blood pressure measurement and blood tests. There are four blood tests that can be used to screen for type 2 diabetes. You may have one or more of the following:  A fasting plasma glucose test (FBG). You will not be allowed to eat for at least eight hours before a blood sample is taken.  A random blood glucose test. This test checks your blood glucose at any time of the day regardless of when you ate.  An oral glucose tolerance test (OGTT). This test measures your blood glucose at two times: ? After you have not eaten (have fasted) overnight. ? Two hours after you drink a glucose-containing beverage. A diagnosis can be made if the level is greater than 200 mg/dL.  An A1c test. This test provides information about blood glucose control over the previous three months.  What do the results mean? Your test results are a measurement  of how much glucose is in your blood. Normal blood glucose levels mean that you do not have diabetes or prediabetes. High blood glucose levels may mean that you have prediabetes or diabetes. Depending on the results, other tests may be needed to confirm the diagnosis. This information is not intended to replace advice given to you by your health care provider. Make sure you discuss any questions you have with your health care provider. Document Released: 12/11/2008 Document Revised: 07/23/2015 Document Reviewed: 12/12/2014 Elsevier Interactive Patient Education  2017 [place] Prevention in the Home Falls can cause injuries. They can happen to people of all ages. There are many things you can do to make your home safe and to help prevent falls. What can I do on the outside of my home?  Regularly fix the edges of walkways and driveways and fix any cracks.  Remove anything that  might make you trip as you walk through a door, such as a raised step or threshold.  Trim any bushes or trees on the path to your home.  Use bright outdoor lighting.  Clear any walking paths of anything that might make someone trip, such as rocks or tools.  Regularly check to see if handrails are loose or broken. Make sure that both sides of any steps have handrails.  Any raised decks and porches should have guardrails on the edges.  Have any leaves, snow, or ice cleared regularly.  Use sand or salt on walking paths during winter.  Clean up any spills in your garage right away. This includes oil or grease spills. What can I do in the bathroom?  Use night lights.  Install grab bars by the toilet and in the tub and shower. Do not use towel bars as grab bars.  Use non-skid mats or decals in the tub or shower.  If you need to sit down in the shower, use a plastic, non-slip stool.  Keep the floor dry. Clean up any water that spills on the floor as soon as it happens.  Remove soap buildup in the  tub or shower regularly.  Attach bath mats securely with double-sided non-slip rug tape.  Do not have throw rugs and other things on the floor that can make you trip. What can I do in the bedroom?  Use night lights.  Make sure that you have a light by your bed that is easy to reach.  Do not use any sheets or blankets that are too big for your bed. They should not hang down onto the floor.  Have a firm chair that has side arms. You can use this for support while you get dressed.  Do not have throw rugs and other things on the floor that can make you trip. What can I do in the kitchen?  Clean up any spills right away.  Avoid walking on wet floors.  Keep items that you use a lot in easy-to-reach places.  If you need to reach something above you, use a strong step stool that has a grab bar.  Keep electrical cords out of the way.  Do not use floor polish or wax that makes floors slippery. If you must use wax, use non-skid floor wax.  Do not have throw rugs and other things on the floor that can make you trip. What can I do with my stairs?  Do not leave any items on the stairs.  Make sure that there are handrails on both sides of the stairs and use them. Fix handrails that are broken or loose. Make sure that handrails are as long as the stairways.  Check any carpeting to make sure that it is firmly attached to the stairs. Fix any carpet that is loose or worn.  Avoid having throw rugs at the top or bottom of the stairs. If you do have throw rugs, attach them to the floor with carpet tape.  Make sure that you have a light switch at the top of the stairs and the bottom of the stairs. If you do not have them, ask someone to add them for you. What else can I do to help prevent falls?  Wear shoes that: ? Do not have high heels. ? Have rubber bottoms. ? Are comfortable and fit you well. ? Are closed at the toe. Do not wear sandals.  If you use a stepladder: ? Make sure that it  is fully opened. Do not climb a closed stepladder. ? Make sure that both sides of the stepladder are locked into place. ? Ask someone to hold it for you, if possible.  Clearly mark and make sure that you can see: ? Any grab bars or handrails. ? First and last steps. ? Where the edge of each step is.  Use tools that help you move around (mobility aids) if they are needed. These include: ? Canes. ? Walkers. ? Scooters. ? Crutches.  Turn on the lights when you go into a dark area. Replace any light bulbs as soon as they burn out.  Set up your furniture so you have a clear path. Avoid moving your furniture around.  If any of your floors are uneven, fix them.  If there are any pets around you, be aware of where they are.  Review your medicines with your doctor. Some medicines can make you feel dizzy. This can increase your chance of falling. Ask your doctor what other things that you can do to help prevent falls. This information is not intended to replace advice given to you by your health care provider. Make sure you discuss any questions you have with your health care provider. Document Released: 12/11/2008 Document Revised: 07/23/2015 Document Reviewed: 03/21/2014 Elsevier Interactive Patient Education  2018 [place] Maintenance, Male A healthy lifestyle and preventive care is important for your health and wellness. Ask your health care provider about what schedule of regular examinations is right for you. What should I know about weight and diet? Eat a Healthy Diet  Eat plenty of vegetables, fruits, whole grains, low-fat dairy products, and lean protein.  Do not eat a lot of foods high in solid fats, added sugars, or salt.  Maintain a Healthy Weight Regular exercise can help you achieve or maintain a healthy weight. You should:  Do at least 150 minutes of exercise each week. The exercise should increase your heart rate and make you sweat (moderate-intensity  exercise).  Do strength-training exercises at least twice a week.  Watch Your Levels of Cholesterol and Blood Lipids  Have your blood tested for lipids and cholesterol every 5 years starting at 76 years of age. If you are at high risk for heart disease, you should start having your blood tested when you are 76 years old. You may need to have your cholesterol levels checked more often if: ? Your lipid or cholesterol levels are high. ? You are older than 76 years of age. ? You are at high risk for heart disease.  What should I know about cancer screening? Many types of cancers can be detected early and may often be prevented. Lung Cancer  You should be screened every year for lung cancer if: ? You are a current smoker who has smoked for at least 30 years. ? You are a former smoker who has quit within the past 15 years.  Talk to your health care provider about your screening options, when you should start screening, and how often you should be screened.  Colorectal Cancer  Routine colorectal cancer screening usually begins at 76 years of age and should be repeated every 5-10 years until you are 76 years old. You may need to be screened more often if early forms of precancerous polyps or small growths are found. Your health care provider may recommend screening at an earlier age if you have risk factors for colon cancer.  Your health care provider may recommend using  home test kits to check for hidden blood in the stool.  A small camera at the end of a tube can be used to examine your colon (sigmoidoscopy or colonoscopy). This checks for the earliest forms of colorectal cancer.  Prostate and Testicular Cancer  Depending on your age and overall health, your health care provider may do certain tests to screen for prostate and testicular cancer.  Talk to your health care provider about any symptoms or concerns you have about testicular or prostate cancer.  Skin Cancer  Check your skin  from head to toe regularly.  Tell your health care provider about any new moles or changes in moles, especially if: ? There is a change in a mole's size, shape, or color. ? You have a mole that is larger than a pencil eraser.  Always use sunscreen. Apply sunscreen liberally and repeat throughout the day.  Protect yourself by wearing long sleeves, pants, a wide-brimmed hat, and sunglasses when outside.  What should I know about heart disease, diabetes, and high blood pressure?  If you are 42-53 years of age, have your blood pressure checked every 3-5 years. If you are 74 years of age or older, have your blood pressure checked every year. You should have your blood pressure measured twice-once when you are at a hospital or clinic, and once when you are not at a hospital or clinic. Record the average of the two measurements. To check your blood pressure when you are not at a hospital or clinic, you can use: ? An automated blood pressure machine at a pharmacy. ? A home blood pressure monitor.  Talk to your health care provider about your target blood pressure.  If you are between 52-74 years old, ask your health care provider if you should take aspirin to prevent heart disease.  Have regular diabetes screenings by checking your fasting blood sugar level. ? If you are at a normal weight and have a low risk for diabetes, have this test once every three years after the age of 35. ? If you are overweight and have a high risk for diabetes, consider being tested at a younger age or more often.  A one-time screening for abdominal aortic aneurysm (AAA) by ultrasound is recommended for men aged 81-75 years who are current or former smokers. What should I know about preventing infection? Hepatitis B If you have a higher risk for hepatitis B, you should be screened for this virus. Talk with your health care provider to find out if you are at risk for hepatitis B infection. Hepatitis C Blood testing is  recommended for:  Everyone born from [birth year] through 1964.  Anyone with known risk factors for hepatitis C.  Sexually Transmitted Diseases (STDs)  You should be screened each year for STDs including gonorrhea and chlamydia if: ? You are sexually active and are younger than 76 years of age. ? You are older than 76 years of age and your health care provider tells you that you are at risk for this type of infection. ? Your sexual activity has changed since you were last screened and you are at an increased risk for chlamydia or gonorrhea. Ask your health care provider if you are at risk.  Talk with your health care provider about whether you are at high risk of being infected with HIV. Your health care provider may recommend a prescription medicine to help prevent HIV infection.  What else can I do?  Schedule regular health, dental, and eye  exams.  Stay current with your vaccines (immunizations).  Do not use any tobacco products, such as cigarettes, chewing tobacco, and e-cigarettes. If you need help quitting, ask your health care provider.  Limit alcohol intake to no more than 2 drinks per day. One drink equals 12 ounces of beer, 5 ounces of wine, or 1 ounces of hard liquor.  Do not use street drugs.  Do not share needles.  Ask your health care provider for help if you need support or information about quitting drugs.  Tell your health care provider if you often feel depressed.  Tell your health care provider if you have ever been abused or do not feel safe at home. This information is not intended to replace advice given to you by your health care provider. Make sure you discuss any questions you have with your health care provider. Document Released: 08/13/2007 Document Revised: 10/14/2015 Document Reviewed: 11/18/2014 Elsevier Interactive Patient Education  [employer].

## 2017-10-03 NOTE — Progress Notes (Signed)
I have reviewed documentation from this visit and I agree with recommendations given.  [name] G. Jordan, MD  [facility]. [place] office.   

## 2017-10-04 ENCOUNTER — Ambulatory Visit: Payer: Medicare Other | Admitting: Family Medicine

## 2017-10-04 ENCOUNTER — Encounter: Payer: Self-pay | Admitting: Family Medicine

## 2017-10-04 ENCOUNTER — Ambulatory Visit (INDEPENDENT_AMBULATORY_CARE_PROVIDER_SITE_OTHER): Payer: Medicare Other

## 2017-10-04 VITALS — BP 132/78 | HR 60 | Temp 98.4°F | Resp 12 | Ht 72.0 in | Wt 199.1 lb

## 2017-10-04 DIAGNOSIS — K5900 Constipation, unspecified: Secondary | ICD-10-CM

## 2017-10-04 DIAGNOSIS — M159 Polyosteoarthritis, unspecified: Secondary | ICD-10-CM

## 2017-10-04 DIAGNOSIS — K219 Gastro-esophageal reflux disease without esophagitis: Secondary | ICD-10-CM | POA: Diagnosis not present

## 2017-10-04 DIAGNOSIS — R739 Hyperglycemia, unspecified: Secondary | ICD-10-CM

## 2017-10-04 DIAGNOSIS — M545 Low back pain: Secondary | ICD-10-CM | POA: Diagnosis not present

## 2017-10-04 DIAGNOSIS — E785 Hyperlipidemia, unspecified: Secondary | ICD-10-CM

## 2017-10-04 DIAGNOSIS — I10 Essential (primary) hypertension: Secondary | ICD-10-CM | POA: Diagnosis not present

## 2017-10-04 LAB — BASIC METABOLIC PANEL
BUN: 21 mg/dL (ref 6–23)
CHLORIDE: 109 meq/L (ref 96–112)
CO2: 28 mEq/L (ref 19–32)
CREATININE: 1.03 mg/dL (ref 0.40–1.50)
Calcium: 10.7 mg/dL — ABNORMAL HIGH (ref 8.4–10.5)
GFR: 74.53 mL/min (ref >60.00)
Glucose, Bld: 109 mg/dL — ABNORMAL HIGH (ref 70–99)
Potassium: 4.6 mEq/L (ref 3.5–5.1)
Sodium: 142 mEq/L (ref 135–145)

## 2017-10-04 LAB — LIPID PANEL
CHOL/HDL RATIO: 3
Cholesterol: 154 mg/dL (ref 0–200)
HDL: 45.5 mg/dL (ref >39.00)
LDL Cholesterol: 95 mg/dL (ref 0–99)
NonHDL: 108.95
TRIGLYCERIDES: 68 mg/dL (ref 0.0–149.0)
VLDL: 13.6 mg/dL (ref 0.0–40.0)

## 2017-10-04 LAB — VITAMIN D 25 HYDROXY (VIT D DEFICIENCY, FRACTURES): VITD: 26.3 ng/mL — AB (ref 30.00–100.00)

## 2017-10-04 LAB — HEMOGLOBIN A1C: Hgb A1c MFr Bld: 6 % (ref 4.6–6.5)

## 2017-10-04 NOTE — Progress Notes (Signed)
    HPI:   David [name] is a 77 y.o. male, who is here today for 6 months follow up.   He was last seen on 03/07/17  Hypertension:  Currently on amlodipine-Olmesartan 5-20 mg daily. Home BP readings: 130's/60-70's.  He is taking medications as instructed, no side effects reported.  He has not noted unusual headache, visual changes, exertional chest pain, dyspnea,  focal weakness, or edema.   Lab Results  Component Value Date   CREATININE 1.27 (H) 08/14/2017   BUN 36 (H) 08/14/2017   NA 143 08/14/2017   K 3.9 08/14/2017   CL 113 (H) 08/14/2017   CO2 20 (L) 08/14/2017    Generalized osteoarthritis  Currently he is on Cymbalta 30 mg daily. Neck,lower back,hips,knees. Following with ortho,right knee OA getting worse,he may need TKR. C/O arthralgias, mainly left hip and knees. Achy, 5/10. Exacerbated by prolonged standing,walking, going up and down stairs.  No edema or erythema.  He is tolerating Cymbalta well,no side effects.    Hyperlipidemia:  Currently on Lipitor 40 mg daily. Following a low fat diet: Yes..  He has not noted side effects with medication.  Lab Results  Component Value Date   CHOL 155 08/02/2016   HDL 44 08/02/2016   LDLCALC 94 08/02/2016   LDLDIRECT 122.1 03/05/2012   TRIG 86 08/02/2016   CHOLHDL 3.5 08/02/2016   GERD:  He is taking omeprazole 20 mg daily. He has been on PPI for 3 -4 years.  No heartburn or acid reflux.   Hypercalcemia: On 08/14/2017 calcium was elevated at 10.8.  In the past his cholesterol has been in the normal high range.  History of prostate and renal cancer, he follows with urologist. Alkaline phosphatase has been elevated for the past year or so.  02/2017 alk Phosphatase 120 (135,144).  Constipation:  Hard stools after ER visit for kidney stone, 08/14/17. He took Hydrocodone for a couple days. . Last bowel movement yesterday. Not sure if he needed to repeat colonoscopy. + FHx for colon  cancer.  Denies abdominal pain, nausea, vomiting, changes in bowel habits, blood in stool or melena.  Lab Results  Component Value Date   HGBA1C 5.7 09/09/2016    Review of Systems  Constitutional: Negative for activity change, appetite change, fatigue and fever.  HENT: Negative for mouth sores, nosebleeds, sore throat and trouble swallowing.   Eyes: Negative for redness and visual disturbance.  Respiratory: Negative for cough, shortness of breath and wheezing.   Cardiovascular: Negative for chest pain, palpitations and leg swelling.  Gastrointestinal: Positive for constipation. Negative for abdominal pain, blood in stool, nausea and vomiting.  Endocrine: Negative for cold intolerance, heat intolerance, polydipsia, polyphagia and polyuria.  Genitourinary: Negative for decreased urine volume, dysuria and hematuria.  Musculoskeletal: Positive for arthralgias. Negative for gait problem.  Neurological: Negative for syncope, weakness and headaches.      Current Outpatient Medications on File Prior to Visit  Medication Sig Dispense Refill  . amLODipine-olmesartan (AZOR) 5-20 MG tablet TAKE 1 TABLET BY MOUTH ONCE DAILY 90 tablet 0  . aspirin EC 81 MG tablet Take 81 mg by mouth daily.    . atorvastatin (LIPITOR) 40 MG tablet TAKE 1 TABLET BY MOUTH ONCE DAILY AT 6:00 PM 90 tablet 0  . cetirizine (ZYRTEC) 10 MG tablet Take 1 tablet (10 mg total) by mouth daily. 90 tablet 2  . DULoxetine (CYMBALTA) 30 MG capsule Take 1 capsule (30 mg total) by mouth daily. 90 capsule 2  .   fluticasone (FLONASE) 50 MCG/ACT nasal spray SHAKE LIQUID AND USE 2 SPRAYS IN EACH NOSTRIL EVERY DAY 16 g 0  . Iodoquinol-HC (HYDROCORTISONE-IODOQUINOL) 1-1 % CREA APP ON THE SKIN BID  2  . omeprazole (PRILOSEC) 20 MG capsule TAKE 1 CAPSULE BY MOUTH ONCE DAILY BEFORE BREAKFAST 90 capsule 0  . sodium chloride (OCEAN) 0.65 % nasal spray Place 1 spray into the nose daily as needed for congestion.     No current  facility-administered medications on file prior to visit.      Past Medical History:  Diagnosis Date  . Arthritis    gen.  and left hip  . BPH (benign prostatic hypertrophy)   . GERD (gastroesophageal reflux disease)    occasional  . History of kidney stones    2007  . Hyperlipidemia   . Hypertension   . Kidney tumor   . LBP (low back pain) 06/2005   L5 radicular symptoms; MRI of LS spine severe spondylosis at L4-L5 with central cancal stenosis   . Nocturia   . Organic impotence   . Prostate cancer ([place]) 10/2011   (Low grade) [facility] - Dr. Junious Silk  . Pyelonephritis 03/05/2013  . Renal oncocytoma of right kidney 01/23/2013   As 3.2 x 3.5 cm enhancing exophytic mass projecting off the lower pole. This is consistent with a renal cell carcinoma. No retroperitoneal lymphadenopathy.   [name] Spermatocele   . 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 Wears glasses    Allergies  Allergen Reactions  . Codeine Itching    Severe hiccups  . Ace Inhibitors Other (See Comments)    REACTION: Cough  . Percocet [Oxycodone-Acetaminophen]     Severe hiccups    Social History   Socioeconomic History  . Marital status: Married    Spouse name: Not on file  . Number of children: 2  . Years of education: Not on file  . Highest education level: Not on file  Occupational History  . Not on file  Social Needs  . Financial resource strain: Not on file  . Food insecurity:    Worry: Not on file    Inability: Not on file  . Transportation needs:    Medical: Not on file    Non-medical: Not on file  Tobacco Use  . Smoking status: Former Smoker    Packs/day: 0.25    Years: 10.00    Pack years: 2.50    Types: Cigarettes    Last attempt to quit: 02/28/1981    Years since quitting: 36.6  . Smokeless tobacco: Never Used  Substance and Sexual Activity  . Alcohol use: Yes    Comment: occasional  . Drug use: Yes    Types: Flunitrazepam  . Sexual activity: Not on file  Lifestyle  . Physical activity:    Days  per week: Not on file    Minutes per session: Not on file  . Stress: Not on file  Relationships  . Social connections:    Talks on phone: Not on file    Gets together: Not on file    Attends religious service: Not on file    Active member of club or organization: Not on file    Attends meetings of clubs or organizations: Not on file    Relationship status: Not on file  Other Topics Concern  . Not on file  Social History Narrative   Married- 27 yrs   2 daughters   5 grandchildren   Never Smoked   Alcohol use-yes (occasional/social)  Retired- Equities trader roster:      Urologist - Dr. [name]   Orthopedics - Dr. [name]   Dermatology - Dr. [name]    Vitals:   10/04/17 0750  BP: 132/78  Pulse: 60  Resp: 12  Temp: 98.4 F (36.9 C)  SpO2: 98%   Body mass index is 27.01 kg/m.   Physical Exam  Nursing note and vitals reviewed. Constitutional: He is oriented to person, place, and time. He appears well-developed and well-nourished. No distress.  HENT:  Head: Normocephalic and atraumatic.  Mouth/Throat: Oropharynx is clear and moist and mucous membranes are normal.  Eyes: Pupils are equal, round, and reactive to light. Conjunctivae are normal.  Cardiovascular: Normal rate and regular rhythm.  No murmur heard. Pulses:      Dorsalis pedis pulses are 2+ on the right side, and 2+ on the left side.  Respiratory: Effort normal and breath sounds normal. No respiratory distress.  GI: Soft. He exhibits no mass. There is no hepatomegaly. There is no tenderness.  Musculoskeletal: He exhibits no edema.       Thoracic back: He exhibits no tenderness and no bony tenderness.       Lumbar back: He exhibits no tenderness and no bony tenderness.  Mild limitation shoulder ROM,pain elicited.  Lymphadenopathy:    He has no cervical adenopathy.  Neurological: He is alert and oriented to person, place, and time. He has normal strength. No cranial nerve deficit. Gait  normal.  Skin: Skin is warm. No rash noted. No erythema.  Psychiatric: He has a normal mood and affect. Cognition and memory are normal.  Well groomed, good eye contact.       ASSESSMENT AND PLAN:   David [name] was seen today for 6 months follow-up.  Orders Placed This Encounter  Procedures  . DG Lumbar Spine Complete  . Lipid panel  . Basic metabolic panel  . Calcium, ionized  . Parathyroid hormone, intact (no Ca)  . VITAMIN D 25 Hydroxy (Vit-D Deficiency, Fractures)  . Hemoglobin A1c   Lab Results  Component Value Date   HGBA1C 6.0 10/04/2017   Lab Results  Component Value Date   CREATININE 1.03 10/04/2017   BUN 21 10/04/2017   NA 142 10/04/2017   K 4.6 10/04/2017   CL 109 10/04/2017   CO2 28 10/04/2017   Lab Results  Component Value Date   CHOL 154 10/04/2017   HDL 45.50 10/04/2017   LDLCALC 95 10/04/2017   LDLDIRECT 122.1 03/05/2012   TRIG 68.0 10/04/2017   CHOLHDL 3 10/04/2017    1. Generalized osteoarthritis of multiple sites  In general pain is better. He does to want to increase Cymbalta dose for now. Some side effects discussed. F/U in 6 months.  2. Hyperlipidemia, unspecified hyperlipidemia type  No changes in current management, will follow labs done today and will give further recommendations accordingly.  - Lipid panel  3. Essential hypertension  Adequately controlled. No changes in current management. DASH-low salt diet to continue. Eye exam recommended annually. F/U in 6 months, before if needed.  - Basic metabolic panel  4. Gastroesophageal reflux disease without esophagitis  Well controlled. Some side effects of PPI's discussed. He agrees with trying to stop it, continue GERD precautions.  5. Hypercalcemia  We discussed possible etiologies. He follows with urologist and reporting normal rectal exam and PSA. Further recommendations will be given according to labs/imaging results. Today lumbar X ray to evaluate  for lytic lesions. Renal CT done recently, 07/2017,no suspicious lesions.  - Basic metabolic panel - Calcium, ionized - Parathyroid hormone, intact (no Ca) - VITAMIN D 25 Hydroxy (Vit-D Deficiency, Fractures) - DG Lumbar Spine Complete; Future  6. Constipation, unspecified constipation type  Adequate hydration and fiber intake. Could be related to recent opioid treatment for renal colic. Colonoscopy was done in 06/2012 and 5 years follow up was recommended.   7. Hyperglycemia  Healthy life style for primary prevention.  - Hemoglobin A1c     G. , MD  [facility]. [place] office.                 

## 2017-10-04 NOTE — Patient Instructions (Addendum)
A few things to remember from today's visit:   Generalized osteoarthritis of multiple sites  Hyperlipidemia, unspecified hyperlipidemia type - Plan: Lipid panel  Essential hypertension - Plan: Basic metabolic panel  Gastroesophageal reflux disease without esophagitis  Hypercalcemia - Plan: Basic metabolic panel, Calcium, ionized, Parathyroid hormone, intact (no Ca), VITAMIN D 25 Hydroxy (Vit-D Deficiency, Fractures), DG Lumbar Spine Complete  Constipation, unspecified constipation type  Hyperglycemia - Plan: Hemoglobin A1c  Try to stop Omeprazole.    Please be sure medication list is accurate. If a new problem present, please set up appointment sooner than planned today.

## 2017-10-05 LAB — CALCIUM, IONIZED: Calcium, Ion: 5.92 mg/dL — ABNORMAL HIGH (ref 4.8–5.6)

## 2017-10-05 LAB — PARATHYROID HORMONE, INTACT (NO CA): PTH: 81 pg/mL — ABNORMAL HIGH (ref 14–64)

## 2017-10-08 ENCOUNTER — Encounter: Payer: Self-pay | Admitting: Family Medicine

## 2017-10-13 ENCOUNTER — Other Ambulatory Visit: Payer: Self-pay | Admitting: Family Medicine

## 2017-10-13 DIAGNOSIS — J309 Allergic rhinitis, unspecified: Secondary | ICD-10-CM

## 2017-10-13 DIAGNOSIS — K219 Gastro-esophageal reflux disease without esophagitis: Secondary | ICD-10-CM

## 2017-10-17 ENCOUNTER — Other Ambulatory Visit: Payer: Self-pay | Admitting: Cardiology

## 2017-10-17 DIAGNOSIS — E7800 Pure hypercholesterolemia, unspecified: Secondary | ICD-10-CM

## 2017-10-17 NOTE — Telephone Encounter (Signed)
°*  STAT* If patient is at the pharmacy, call can be transferred to refill team.   1. Which medications need to be refilled? (please list name of each medication and dose if known) Atorvastatin 40 mg Amlodipine/olmesartan5/20 mg 2. Which pharmacy/location (including street and city if local pharmacy) is medication to be sent to [place] on [address]  3. Do they need a 30 day or 90 day supply? Patient would like to know if he can 90 day

## 2017-10-23 ENCOUNTER — Telehealth: Payer: Self-pay | Admitting: Cardiology

## 2017-10-23 MED ORDER — AMLODIPINE-OLMESARTAN 5-20 MG PO TABS: 1.0000 | ORAL_TABLET | Freq: Every day | ORAL | 0 refills | Status: DC

## 2017-10-23 NOTE — Telephone Encounter (Signed)
New Message:    Pt stated he [facility] has not received a response to fill his medication   *STAT* If patient is at the pharmacy, call can be transferred to refill team.   1. Which medications need to be refilled? (please list name of each medication and dose if known) amLODipine-olmesartan (AZOR) 5-20 MG tablet  2. Which pharmacy/location (including street and city if local pharmacy) is medication to be sent to? TAKE 1 TABLET BY MOUTH ONCE DAILY  3. Do they need a 30 day or 90 day supply? 90 days

## 2017-10-24 DIAGNOSIS — H26492 Other secondary cataract, left eye: Secondary | ICD-10-CM | POA: Diagnosis not present

## 2017-10-24 DIAGNOSIS — Z961 Presence of intraocular lens: Secondary | ICD-10-CM | POA: Diagnosis not present

## 2017-10-26 MED ORDER — ATORVASTATIN CALCIUM 40 MG PO TABS: ORAL_TABLET | ORAL | 0 refills | Status: DC

## 2017-10-26 NOTE — Telephone Encounter (Signed)
RX submitted to pharmacy   

## 2017-10-26 NOTE — Telephone Encounter (Signed)
°*  STAT* If patient is at the pharmacy, call can be transferred to refill team.   1. Which medications need to be refilled? (please list name of each medication and dose if known) atorbastatin  2. Which pharmacy/location (including street and city if local pharmacy) is medication to be sent to?[facility] [phone]  3. Do they need a 30 day or 90 day supply?90 and refills

## 2017-11-28 ENCOUNTER — Other Ambulatory Visit: Payer: Self-pay | Admitting: Family Medicine

## 2017-11-28 DIAGNOSIS — J309 Allergic rhinitis, unspecified: Secondary | ICD-10-CM

## 2017-12-08 DIAGNOSIS — M5136 Other intervertebral disc degeneration, lumbar region: Secondary | ICD-10-CM | POA: Diagnosis not present

## 2017-12-08 DIAGNOSIS — S39012A Strain of muscle, fascia and tendon of lower back, initial encounter: Secondary | ICD-10-CM | POA: Diagnosis not present

## 2017-12-08 DIAGNOSIS — M25551 Pain in right hip: Secondary | ICD-10-CM | POA: Diagnosis not present

## 2017-12-26 NOTE — Progress Notes (Signed)
HPI: FU dyspnea. Abdominal CT December 2015 showed no aneurysm but there was note of coronary calcification. Chest CT April 2018 showed a 5 mm right lower lobe pulmonary nodule unchanged and no acute cardiopulmonary disease.   Echocardiogram May 2018 showed normal LV systolic function, mild diastolic dysfunction, trace aortic insufficiency, mildly dilated a sending aorta.  Exercise treadmill May 2018 negative.  Since last seen the patient has dyspnea with more extreme activities but not with routine activities. It is relieved with rest. It is not associated with chest pain. There is no orthopnea, PND or pedal edema. There is no syncope or palpitations. There is no exertional chest pain.   Current Outpatient Medications  Medication Sig Dispense Refill  . amLODipine-olmesartan (AZOR) 5-20 MG tablet Take 1 tablet by mouth daily. 90 tablet 0  . aspirin EC 81 MG tablet Take 81 mg by mouth daily.    [name] atorvastatin (LIPITOR) 40 MG tablet TAKE 1 TABLET BY MOUTH ONCE DAILY AT 6:00 PM, must keep follow up appointment. 90 tablet 0  . cetirizine (ZYRTEC) 10 MG tablet Take 1 tablet (10 mg total) by mouth daily. 90 tablet 2  . DULoxetine (CYMBALTA) 30 MG capsule Take 1 capsule (30 mg total) by mouth daily. 90 capsule 2  . fluticasone (FLONASE) 50 MCG/ACT nasal spray SHAKE LIQUID AND USE 2 SPRAYS IN EACH NOSTRIL EVERY DAY 48 g 0  . Iodoquinol-HC (HYDROCORTISONE-IODOQUINOL) 1-1 % CREA APP ON THE SKIN BID  2  . omeprazole (PRILOSEC) 20 MG capsule TAKE 1 CAPSULE BY MOUTH ONCE DAILY BEFORE BREAKFAST 90 capsule 0  . sodium chloride (OCEAN) 0.65 % nasal spray Place 1 spray into the nose daily as needed for congestion.    [name] VITAMIN D, CHOLECALCIFEROL, PO Take 1 tablet by mouth daily.     No current facility-administered medications for this visit.      Past Medical History:  Diagnosis Date  . Arthritis    gen.  and left hip  . BPH (benign prostatic hypertrophy)   . GERD (gastroesophageal reflux disease)     occasional  . History of kidney stones    2007  . Hyperlipidemia   . Hypertension   . Kidney tumor   . LBP (low back pain) 06/2005   L5 radicular symptoms; MRI of LS spine severe spondylosis at L4-L5 with central cancal stenosis   . Nocturia   . Organic impotence   . Prostate cancer ([place]) 10/2011   (Low grade) [facility] - Dr. Junious Silk  . Pyelonephritis 03/05/2013  . Renal oncocytoma of right kidney 01/23/2013   As 3.2 x 3.5 cm enhancing exophytic mass projecting off the lower pole. This is consistent with a renal cell carcinoma. No retroperitoneal lymphadenopathy.   [name] Spermatocele   . 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 Wears glasses     Past Surgical History:  Procedure Laterality Date  . COLONOSCOPY  02/15/2006, 2014   Internal hemorrhoids (Dr [name]), last in 2014  . INGUINAL HERNIA REPAIR Right 1980  . LYMPHADENECTOMY Bilateral 02/15/2013   Procedure: LYMPHADENECTOMY WITH INDOCYANINE GREEN DYE INJECTION;  Surgeon: [name], MD;  Location: [facility];  Service: Urology;  Laterality: Bilateral;  . PROSTATE BIOPSY N/A 12/18/2012   Procedure: BIOPSY TRANSRECTAL ULTRASONIC PROSTATE (TUBP);  Surgeon: [name], MD;  Location: [facility];  Service: Urology;  Laterality: N/A;  . PROSTATE BIOPSY  12/05/11   gleason 6, 3/12 cores  . REPAIR UMBILICAL AND VENTRAL HERNIA'S W/ MESH  08-21-2009  .  ROBOT ASSISTED LAPAROSCOPIC RADICAL PROSTATECTOMY N/A 02/15/2013   Procedure: ROBOTIC ASSISTED LAPAROSCOPIC RADICAL PROSTATECTOMY;  Surgeon: [name], MD;  Location: [facility];  Service: Urology;  Laterality: N/A;  . ROBOTIC ASSITED PARTIAL NEPHRECTOMY Right 02/15/2013   Procedure: ROBOTIC ASSITED PARTIAL NEPHRECTOMY;  Surgeon: [name], MD;  Location: [facility];  Service: Urology;  Laterality: Right;  . SPERMATOCELECTOMY Left 12/18/2012   Procedure: LEFT SPERMATOCELECTOMY;  Surgeon: [name], MD;  Location: [facility];  Service:  Urology;  Laterality: Left;  . TONSILLECTOMY AND ADENOIDECTOMY  as child  . TRANSURETHRAL RESECTION OF PROSTATE  AGE 77    Social History   Socioeconomic History  . Marital status: Married    Spouse name: Not on file  . Number of children: 2  . Years of education: Not on file  . Highest education level: Not on file  Occupational History  . Not on file  Social Needs  . Financial resource strain: Not on file  . Food insecurity:    Worry: Not on file    Inability: Not on file  . Transportation needs:    Medical: Not on file    Non-medical: Not on file  Tobacco Use  . Smoking status: Former Smoker    Packs/day: 0.25    Years: 10.00    Pack years: 2.50    Types: Cigarettes    Last attempt to quit: 02/28/1981    Years since quitting: 36.8  . Smokeless tobacco: Never Used  Substance and Sexual Activity  . Alcohol use: Yes    Comment: occasional  . Drug use: Yes    Types: Flunitrazepam  . Sexual activity: Not on file  Lifestyle  . Physical activity:    Days per week: Not on file    Minutes per session: Not on file  . Stress: Not on file  Relationships  . Social connections:    Talks on phone: Not on file    Gets together: Not on file    Attends religious service: Not on file    Active member of club or organization: Not on file    Attends meetings of clubs or organizations: Not on file    Relationship status: Not on file  . Intimate partner violence:    Fear of current or ex partner: Not on file    Emotionally abused: Not on file    Physically abused: Not on file    Forced sexual activity: Not on file  Other Topics Concern  . Not on file  Social History Narrative   Married- 67 yrs   2 daughters   5 grandchildren   Never Smoked   Alcohol use-yes (occasional/social)   Retired- Engineer, water      Physician roster:      Urologist - Dr. [name]   Orthopedics - Dr. [name]   Dermatology - Dr. [name]    Family History  Problem Relation Age of Onset  .  Cancer Mother        colon  . Colon cancer Mother 80  . Pulmonary fibrosis Father        father owned a Radio broadcast assistant (father blamed inhalation of chemicals and fumes)  . Pulmonary fibrosis Brother   . Coronary artery disease Other   . Hyperlipidemia Other     ROS: Knee arthralgias and low back pain but no fevers or chills, productive cough, hemoptysis, dysphasia, odynophagia, melena, hematochezia, dysuria, hematuria, rash, seizure activity, orthopnea, PND, pedal edema, claudication. Remaining systems  are negative.  Physical Exam: Well-developed well-nourished in no acute distress.  Skin is warm and dry.  HEENT is normal.  Neck is supple.  Chest is clear to auscultation with normal expansion.  Cardiovascular exam is regular rate and rhythm.  Abdominal exam nontender or distended. No masses palpated. Extremities show no edema. neuro grossly intact  ECG-sinus rhythm at a rate of 62.  IVCD.  Personally reviewed  A/P  1 coronary artery disease/coronary artery calcification- previous treadmill negative.  LV function normal.  Plan continue medical therapy.  Continue aspirin and statin.  2 hyperlipidemia-continue statin.  Check lipids and liver.  3 hypertension-patient's blood pressure is controlled.  Continue present medications and follow.  [name], MD

## 2018-01-01 ENCOUNTER — Encounter: Payer: Self-pay | Admitting: Gastroenterology

## 2018-01-08 ENCOUNTER — Ambulatory Visit: Payer: Medicare Other | Admitting: Cardiology

## 2018-01-08 ENCOUNTER — Encounter: Payer: Self-pay | Admitting: Cardiology

## 2018-01-08 VITALS — BP 122/66 | HR 62 | Ht 72.0 in | Wt 203.0 lb

## 2018-01-08 DIAGNOSIS — I2510 Atherosclerotic heart disease of native coronary artery without angina pectoris: Secondary | ICD-10-CM

## 2018-01-08 DIAGNOSIS — E785 Hyperlipidemia, unspecified: Secondary | ICD-10-CM

## 2018-01-08 DIAGNOSIS — I10 Essential (primary) hypertension: Secondary | ICD-10-CM | POA: Diagnosis not present

## 2018-01-08 LAB — HEPATIC FUNCTION PANEL
ALT: 26 IU/L (ref 0–44)
AST: 25 IU/L (ref 0–40)
Albumin: 4.4 g/dL (ref 3.5–4.8)
Alkaline Phosphatase: 125 IU/L — ABNORMAL HIGH (ref 39–117)
BILIRUBIN, DIRECT: 0.18 mg/dL (ref 0.00–0.40)
Bilirubin Total: 0.7 mg/dL (ref 0.0–1.2)
Total Protein: 6.7 g/dL (ref 6.0–8.5)

## 2018-01-08 NOTE — Patient Instructions (Signed)
Medication Instructions:  Your physician recommends that you continue on your current medications as directed. Please refer to the Current Medication list given to you today.  If you need a refill on your cardiac medications before your next appointment, please call your pharmacy.   Lab work: Hepatic panel today If you have labs (blood work) drawn today and your tests are completely normal, you will receive your results only by: [name] MyChart Message (if you have MyChart) OR . A paper copy in the mail If you have any lab test that is abnormal or we need to change your treatment, we will call you to review the results.  Testing/Procedures: None ordered  Follow-Up: At [facility], you and your health needs are our priority.  As part of our continuing mission to provide you with exceptional heart care, we have created designated Provider Care Teams.  These Care Teams include your primary Cardiologist (physician) and Advanced Practice Providers (APPs -  Physician Assistants and Nurse Practitioners) who all work together to provide you with the care you need, when you need it. You will need a follow up appointment in 1 years.  Please call our office 2 months in advance to schedule this appointment.  You may see Dr.Crenshaw or one of the following Advanced Practice Providers on your designated Care Team:   Kerin Ransom, PA-C Roby Lofts, [place] . Sande Rives, PA-C  Any Other Special Instructions Will Be Listed Below (If Applicable).

## 2018-01-10 ENCOUNTER — Other Ambulatory Visit: Payer: Self-pay | Admitting: Family Medicine

## 2018-01-10 DIAGNOSIS — K219 Gastro-esophageal reflux disease without esophagitis: Secondary | ICD-10-CM

## 2018-01-10 DIAGNOSIS — M25551 Pain in right hip: Secondary | ICD-10-CM | POA: Diagnosis not present

## 2018-01-11 ENCOUNTER — Other Ambulatory Visit: Payer: Self-pay | Admitting: Family Medicine

## 2018-01-11 DIAGNOSIS — M159 Polyosteoarthritis, unspecified: Secondary | ICD-10-CM

## 2018-01-29 ENCOUNTER — Encounter: Payer: Self-pay | Admitting: Gastroenterology

## 2018-01-29 ENCOUNTER — Ambulatory Visit (AMBULATORY_SURGERY_CENTER): Payer: Self-pay | Admitting: *Deleted

## 2018-01-29 VITALS — Ht 72.0 in | Wt 205.0 lb

## 2018-01-29 DIAGNOSIS — Z800 Family history of malignant neoplasm of digestive organs: Secondary | ICD-10-CM

## 2018-01-29 MED ORDER — NA SULFATE-K SULFATE-MG SULF 17.5-3.13-1.6 GM/177ML PO SOLN: ORAL | 0 refills | Status: DC

## 2018-01-29 NOTE — Progress Notes (Signed)
Patient denies any allergies to eggs or soy. Patient denies any problems with anesthesia/sedation. Patient denies any oxygen use at home. Patient denies taking any diet/weight loss medications or blood thinners. EMMI education assisgned to patient on colonoscopy, this was explained and instructions given to patient. 2 day prep given to pt.

## 2018-01-31 ENCOUNTER — Other Ambulatory Visit: Payer: Self-pay | Admitting: Family Medicine

## 2018-01-31 DIAGNOSIS — K219 Gastro-esophageal reflux disease without esophagitis: Secondary | ICD-10-CM

## 2018-01-31 DIAGNOSIS — M159 Polyosteoarthritis, unspecified: Secondary | ICD-10-CM

## 2018-02-02 ENCOUNTER — Other Ambulatory Visit: Payer: Self-pay | Admitting: *Deleted

## 2018-02-02 DIAGNOSIS — E7800 Pure hypercholesterolemia, unspecified: Secondary | ICD-10-CM

## 2018-02-02 MED ORDER — ATORVASTATIN CALCIUM 40 MG PO TABS: ORAL_TABLET | ORAL | 3 refills | Status: DC

## 2018-02-07 ENCOUNTER — Other Ambulatory Visit: Payer: Self-pay | Admitting: Family Medicine

## 2018-02-07 DIAGNOSIS — M159 Polyosteoarthritis, unspecified: Secondary | ICD-10-CM

## 2018-02-07 DIAGNOSIS — K219 Gastro-esophageal reflux disease without esophagitis: Secondary | ICD-10-CM

## 2018-02-12 ENCOUNTER — Ambulatory Visit (AMBULATORY_SURGERY_CENTER): Payer: Medicare Other | Admitting: Gastroenterology

## 2018-02-12 ENCOUNTER — Encounter: Payer: Self-pay | Admitting: Gastroenterology

## 2018-02-12 VITALS — BP 126/61 | HR 63 | Temp 97.1°F | Resp 10 | Ht 72.0 in | Wt 203.0 lb

## 2018-02-12 DIAGNOSIS — Z800 Family history of malignant neoplasm of digestive organs: Secondary | ICD-10-CM | POA: Diagnosis not present

## 2018-02-12 DIAGNOSIS — D122 Benign neoplasm of ascending colon: Secondary | ICD-10-CM | POA: Diagnosis not present

## 2018-02-12 DIAGNOSIS — D120 Benign neoplasm of cecum: Secondary | ICD-10-CM | POA: Diagnosis not present

## 2018-02-12 DIAGNOSIS — D123 Benign neoplasm of transverse colon: Secondary | ICD-10-CM | POA: Diagnosis not present

## 2018-02-12 DIAGNOSIS — Z1211 Encounter for screening for malignant neoplasm of colon: Secondary | ICD-10-CM | POA: Diagnosis not present

## 2018-02-12 MED ORDER — SODIUM CHLORIDE 0.9 % IV SOLN: 500.0000 mL | Freq: Once | INTRAVENOUS | Status: DC

## 2018-02-12 NOTE — Progress Notes (Signed)
Pt's states no medical or surgical changes since previsit or office visit. 

## 2018-02-12 NOTE — Progress Notes (Signed)
A/ox3, pleased with MAC, report to RN 

## 2018-02-12 NOTE — Op Note (Signed)
[facility] Patient Name: David [name] Procedure Date: 02/12/2018 10:15 AM MRN: [id number] Endoscopist: Remo Lipps P. Havery Moros , MD Age: 77 Referring MD:  Date of Birth: 10-Nov-1940 Gender: Male Account #: [id number] Procedure:                Colonoscopy Indications:              Screening in patient at increased risk: Family                            history of 1st-degree relative with colorectal                            cancer before age 22 years, last exam 5 years ago,                            reportedly prep inadequate Medicines:                Monitored Anesthesia Care Procedure:                Pre-Anesthesia Assessment:                           - Prior to the procedure, a History and Physical                            was performed, and patient medications and                            allergies were reviewed. The patient's tolerance of                            previous anesthesia was also reviewed. The risks                            and benefits of the procedure and the sedation                            options and risks were discussed with the patient.                            All questions were answered, and informed consent                            was obtained. Prior Anticoagulants: The patient has                            taken no previous anticoagulant or antiplatelet                            agents. ASA Grade Assessment: II - A patient with                            mild systemic disease. After reviewing the risks  and benefits, the patient was deemed in                            satisfactory condition to undergo the procedure.                           After obtaining informed consent, the colonoscope                            was passed under direct vision. Throughout the                            procedure, the patient's blood pressure, pulse, and                            oxygen saturations were monitored  continuously. The                            Colonoscope was introduced through the anus and                            advanced to the the cecum, identified by                            appendiceal orifice and ileocecal valve. The                            colonoscopy was performed without difficulty. The                            patient tolerated the procedure well. The quality                            of the bowel preparation was adequate. The                            ileocecal valve, appendiceal orifice, and rectum                            were photographed. Scope In: 10:24:52 AM Scope Out: 11:01:06 AM Scope Withdrawal Time: 0 hours 30 minutes 43 seconds  Total Procedure Duration: 0 hours 36 minutes 14 seconds  Findings:                 The perianal and digital rectal examinations were                            normal.                           Three sessile polyps were found in the cecum. The                            polyps were 3 to 4 mm in size. These polyps were  removed with a cold snare. Resection and retrieval                            were complete.                           Ten sessile polyps were found in the ascending                            colon. The polyps were 3 to 5 mm in size. These                            polyps were removed with a cold snare. Resection                            and retrieval were complete.                           Three sessile polyps were found in the transverse                            colon. The polyps were 3 to 4 mm in size. These                            polyps were removed with a cold snare. Resection                            and retrieval were complete.                           The colon was tortuous. The prep was fair upon                            insertion, several minutes spent lavaging the colon                            to achieve adequate views.                           The  exam was otherwise without abnormality. Complications:            No immediate complications. Estimated blood loss:                            Minimal. Estimated Blood Loss:     Estimated blood loss was minimal. Impression:               - Three 3 to 4 mm polyps in the cecum, removed with                            a cold snare. Resected and retrieved.                           - Ten 3 to 5 mm polyps in the ascending  colon,                            removed with a cold snare. Resected and retrieved.                           - Three 3 to 4 mm polyps in the transverse colon,                            removed with a cold snare. Resected and retrieved.                           - Tortuous colon.                           - The examination was otherwise normal. Recommendation:           - Patient has a contact number available for                            emergencies. The signs and symptoms of potential                            delayed complications were discussed with the                            patient. Return to normal activities tomorrow.                            Written discharge instructions were provided to the                            patient.                           - Resume previous diet.                           - Continue present medications.                           - Await pathology results. Remo Lipps P. Shree Espey, MD 02/12/2018 11:05:46 AM This report has been signed electronically.

## 2018-02-12 NOTE — Patient Instructions (Signed)
Discharge instructions given. Handout on polyps. Resume previous medications. YOU HAD AN ENDOSCOPIC PROCEDURE TODAY AT THE [facility]:   Refer to the procedure report that was given to you for any specific questions about what was found during the examination.  If the procedure report does not answer your questions, please call your gastroenterologist to clarify.  If you requested that your care partner not be given the details of your procedure findings, then the procedure report has been included in a sealed envelope for you to review at your convenience later.  YOU SHOULD EXPECT: Some feelings of bloating in the abdomen. Passage of more gas than usual.  Walking can help get rid of the air that was put into your GI tract during the procedure and reduce the bloating. If you had a lower endoscopy (such as a colonoscopy or flexible sigmoidoscopy) you may notice spotting of blood in your stool or on the toilet paper. If you underwent a bowel prep for your procedure, you may not have a normal bowel movement for a few days.  Please Note:  You might notice some irritation and congestion in your nose or some drainage.  This is from the oxygen used during your procedure.  There is no need for concern and it should clear up in a day or so.  SYMPTOMS TO REPORT IMMEDIATELY:   Following lower endoscopy (colonoscopy or flexible sigmoidoscopy):  Excessive amounts of blood in the stool  Significant tenderness or worsening of abdominal pains  Swelling of the abdomen that is new, acute  Fever of 100F or higher   For urgent or emergent issues, a gastroenterologist can be reached at any hour by calling [phone].   DIET:  We do recommend a small meal at first, but then you may proceed to your regular diet.  Drink plenty of fluids but you should avoid alcoholic beverages for 24 hours.  ACTIVITY:  You should plan to take it easy for the rest of today and you should NOT DRIVE or use heavy  machinery until tomorrow (because of the sedation medicines used during the test).    FOLLOW UP: Our staff will call the number listed on your records the next business day following your procedure to check on you and address any questions or concerns that you may have regarding the information given to you following your procedure. If we do not reach you, we will leave a message.  However, if you are feeling well and you are not experiencing any problems, there is no need to return our call.  We will assume that you have returned to your regular daily activities without incident.  If any biopsies were taken you will be contacted by phone or by letter within the next 1-3 weeks.  Please call us at [phone] if you have not heard about the biopsies in 3 weeks.    SIGNATURES/CONFIDENTIALITY: You and/or your care partner have signed paperwork which will be entered into your electronic medical record.  These signatures attest to the fact that that the information above on your After Visit Summary has been reviewed and is understood.  Full responsibility of the confidentiality of this discharge information lies with you and/or your care-partner. 

## 2018-02-12 NOTE — Progress Notes (Signed)
Called to room to assist during endoscopic procedure.  Patient ID and intended procedure confirmed with present staff. Received instructions for my participation in the procedure from the performing physician.  

## 2018-02-13 ENCOUNTER — Telehealth: Payer: Self-pay | Admitting: *Deleted

## 2018-02-13 ENCOUNTER — Telehealth: Payer: Self-pay

## 2018-02-13 NOTE — Telephone Encounter (Signed)
Second follow up call attempt.  Call would not go through.  Checked phone number.

## 2018-02-13 NOTE — Telephone Encounter (Signed)
Number given, called twice, did not work.  Will try again later in the day for follow up.

## 2018-02-15 ENCOUNTER — Other Ambulatory Visit: Payer: Self-pay

## 2018-02-15 DIAGNOSIS — M159 Polyosteoarthritis, unspecified: Secondary | ICD-10-CM

## 2018-02-15 MED ORDER — DULOXETINE HCL 30 MG PO CPEP: 30.0000 mg | ORAL_CAPSULE | Freq: Every day | ORAL | 2 refills | Status: DC

## 2018-03-25 ENCOUNTER — Other Ambulatory Visit: Payer: Self-pay | Admitting: Cardiology

## 2018-04-06 ENCOUNTER — Encounter: Payer: Self-pay | Admitting: Family Medicine

## 2018-04-06 ENCOUNTER — Ambulatory Visit: Payer: Medicare Other | Admitting: Family Medicine

## 2018-04-06 VITALS — BP 120/64 | HR 66 | Temp 98.0°F | Resp 12 | Ht 72.0 in | Wt 210.4 lb

## 2018-04-06 DIAGNOSIS — M159 Polyosteoarthritis, unspecified: Secondary | ICD-10-CM

## 2018-04-06 DIAGNOSIS — E559 Vitamin D deficiency, unspecified: Secondary | ICD-10-CM | POA: Insufficient documentation

## 2018-04-06 DIAGNOSIS — R7303 Prediabetes: Secondary | ICD-10-CM | POA: Insufficient documentation

## 2018-04-06 DIAGNOSIS — I10 Essential (primary) hypertension: Secondary | ICD-10-CM | POA: Diagnosis not present

## 2018-04-06 DIAGNOSIS — R748 Abnormal levels of other serum enzymes: Secondary | ICD-10-CM | POA: Diagnosis not present

## 2018-04-06 LAB — HEPATIC FUNCTION PANEL
ALK PHOS: 112 U/L (ref 39–117)
ALT: 21 U/L (ref 0–53)
AST: 21 U/L (ref 0–37)
Albumin: 4.1 g/dL (ref 3.5–5.2)
Bilirubin, Direct: 0.1 mg/dL (ref 0.0–0.3)
TOTAL PROTEIN: 6.5 g/dL (ref 6.0–8.3)
Total Bilirubin: 0.8 mg/dL (ref 0.2–1.2)

## 2018-04-06 LAB — BASIC METABOLIC PANEL
BUN: 22 mg/dL (ref 6–23)
CO2: 26 mEq/L (ref 19–32)
Calcium: 9.9 mg/dL (ref 8.4–10.5)
Chloride: 110 mEq/L (ref 96–112)
Creatinine, Ser: 0.95 mg/dL (ref 0.40–1.50)
GFR: 76.88 mL/min (ref >60.00)
Glucose, Bld: 91 mg/dL (ref 70–99)
Potassium: 4.5 mEq/L (ref 3.5–5.1)
Sodium: 144 mEq/L (ref 135–145)

## 2018-04-06 LAB — VITAMIN D 25 HYDROXY (VIT D DEFICIENCY, FRACTURES): VITD: 36.79 ng/mL (ref 30.00–100.00)

## 2018-04-06 LAB — HEMOGLOBIN A1C: HEMOGLOBIN A1C: 5.9 % (ref 4.6–6.5)

## 2018-04-06 LAB — CBC
HCT: 42.5 % (ref 39.0–52.0)
Hemoglobin: 14.3 g/dL (ref 13.0–17.0)
MCHC: 33.8 g/dL (ref 30.0–36.0)
MCV: 93.7 fl (ref 78.0–100.0)
Platelets: 210 10*3/uL (ref 150.0–400.0)
RBC: 4.54 Mil/uL (ref 4.22–5.81)
RDW: 14 % (ref 11.5–15.5)
WBC: 6.2 10*3/uL (ref 4.0–10.5)

## 2018-04-06 NOTE — Progress Notes (Signed)
HPI:   David [name] is a 78 y.o. male, who is here today for 6 months follow up.   He was last seen on 10/04/17.  Since his last OV he has followed with cardiologist.  Hypertension: He is saw Dr. Stanford Breed in 12/2017. Currently he is on Azor 5-20 mg daily. Denies severe/frequent headache, visual changes, chest pain, dyspnea, palpitation, claudication, focal weakness, or worsening edema.   Lab Results  Component Value Date   CREATININE 1.03 10/04/2017   BUN 21 10/04/2017   NA 142 10/04/2017   K 4.6 10/04/2017   CL 109 10/04/2017   CO2 28 10/04/2017   Hypercalcemia and vitamin D deficiency, currently he is on vitamin D,not sure about how many units. He denies abdominal pain, changes in bowel habits, fasciculations, cramps, or MS changes. 25 OH vit D was 26.3 in 09/2017. Last Ca++ 10.7 (10.8) and I PTH elevated at 81.     Prediabetes: Due to the holidays he has not been following a healthful diet, he has gained some weight.  Lab Results  Component Value Date   HGBA1C 6.0 10/04/2017   Denies abdominal pain, nausea,vomiting, polydipsia,polyuria, or polyphagia.  Generalized OA: Currently he is on Cymbalta 30 mg daily, weeks is still helping. Joint pain exacerbated by prolonged walking, standing, overhead repetitive activities.  No side effects from med.  Elevated alkaline phosphatase.  Lab Results  Component Value Date   ALT 26 01/08/2018   AST 25 01/08/2018   GGT 29 11/02/2016   ALKPHOS 125 (H) 01/08/2018   BILITOT 0.7 01/08/2018   He follows with urologist, reporting PSA is normal, last visit a week ago.    Review of Systems  Constitutional: Negative for activity change, appetite change, fatigue and fever.  HENT: Negative for nosebleeds, sore throat and trouble swallowing.   Eyes: Negative for redness and visual disturbance.  Respiratory: Negative for apnea, cough, shortness of breath and wheezing.   Cardiovascular: Negative for chest pain,  palpitations and leg swelling.  Gastrointestinal: Negative for abdominal pain, nausea and vomiting.  Endocrine: Negative for polydipsia, polyphagia and polyuria.  Genitourinary: Negative for decreased urine volume, dysuria and hematuria.  Musculoskeletal: Positive for arthralgias and back pain. Negative for myalgias.  Neurological: Negative for dizziness, syncope, weakness and headaches.     Current Outpatient Medications on File Prior to Visit  Medication Sig Dispense Refill  . amLODipine-olmesartan (AZOR) 5-20 MG tablet TAKE 1 TABLET BY MOUTH DAILY 90 tablet 3  . aspirin EC 81 MG tablet Take 81 mg by mouth daily.    [name] atorvastatin (LIPITOR) 40 MG tablet TAKE 1 TABLET BY MOUTH ONCE DAILY AT 6:00 PM 90 tablet 3  . cetirizine (ZYRTEC) 10 MG tablet Take 1 tablet (10 mg total) by mouth daily. 90 tablet 2  . DULoxetine (CYMBALTA) 30 MG capsule Take 1 capsule (30 mg total) by mouth daily. 90 capsule 2  . fluticasone (FLONASE) 50 MCG/ACT nasal spray SHAKE LIQUID AND USE 2 SPRAYS IN EACH NOSTRIL EVERY DAY 48 g 0  . Iodoquinol-HC (HYDROCORTISONE-IODOQUINOL) 1-1 % CREA APP ON THE SKIN BID  2  . omeprazole (PRILOSEC) 20 MG capsule TAKE 1 CAPSULE BY MOUTH ONCE DAILY BEFORE BREAKFAST 90 capsule 0  . sodium chloride (OCEAN) 0.65 % nasal spray Place 1 spray into the nose daily as needed for congestion.    [name] VITAMIN D, CHOLECALCIFEROL, PO Take 1 tablet by mouth daily.     No current facility-administered medications on file prior to  visit.      Past Medical History:  Diagnosis Date  . Allergy   . Arthritis    gen.  and left hip  . BPH (benign prostatic hypertrophy)   . GERD (gastroesophageal reflux disease)    occasional  . History of kidney stones    2007  . Hyperlipidemia   . Hypertension   . Kidney tumor   . LBP (low back pain) 06/2005   L5 radicular symptoms; MRI of LS spine severe spondylosis at L4-L5 with central cancal stenosis   . Nocturia   . Organic impotence   . Prostate cancer  ([place]) 10/2011   (Low grade) [facility] - Dr. Junious Silk  . Pyelonephritis 03/05/2013  . Renal oncocytoma of right kidney 01/23/2013   As 3.2 x 3.5 cm enhancing exophytic mass projecting off the lower pole. This is consistent with a renal cell carcinoma. No retroperitoneal lymphadenopathy.   [name] Spermatocele   . 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 Wears glasses    Allergies  Allergen Reactions  . Codeine Itching    Severe hiccups  . Ace Inhibitors Other (See Comments)    REACTION: Cough  . Percocet [Oxycodone-Acetaminophen]     Severe hiccups    Social History   Socioeconomic History  . Marital status: Married    Spouse name: Not on file  . Number of children: 2  . Years of education: Not on file  . Highest education level: Not on file  Occupational History  . Not on file  Social Needs  . Financial resource strain: Not on file  . Food insecurity:    Worry: Not on file    Inability: Not on file  . Transportation needs:    Medical: Not on file    Non-medical: Not on file  Tobacco Use  . Smoking status: Former Smoker    Packs/day: 0.25    Years: 10.00    Pack years: 2.50    Types: Cigarettes    Last attempt to quit: 02/28/1981    Years since quitting: 37.1  . Smokeless tobacco: Never Used  Substance and Sexual Activity  . Alcohol use: Yes    Alcohol/week: 1.0 standard drinks    Types: 1 Glasses of wine per week  . Drug use: Not Currently    Types: Flunitrazepam  . Sexual activity: Not on file  Lifestyle  . Physical activity:    Days per week: Not on file    Minutes per session: Not on file  . Stress: Not on file  Relationships  . Social connections:    Talks on phone: Not on file    Gets together: Not on file    Attends religious service: Not on file    Active member of club or organization: Not on file    Attends meetings of clubs or organizations: Not on file    Relationship status: Not on file  Other Topics Concern  . Not on file  Social History Narrative   Married-  10 yrs   2 daughters   5 grandchildren   Never Smoked   Alcohol use-yes (occasional/social)   Retired- Engineer, water      Physician roster:      Urologist - Dr. [name]   Orthopedics - Dr. [name]   Dermatology - Dr. [name]    Vitals:   04/06/18 0828  BP: 120/64  Pulse: 66  Resp: 12  Temp: 98 F (36.7 C)   Body mass index is 28.54 kg/m.   Wt  Readings from Last 3 Encounters:  04/06/18 210 lb 6.4 oz (95.4 kg)  02/12/18 203 lb (92.1 kg)  01/29/18 205 lb (93 kg)    Physical Exam  Nursing note and vitals reviewed. Constitutional: He is oriented to person, place, and time. He appears well-developed. No distress.  HENT:  Head: Normocephalic and atraumatic.  Mouth/Throat: Oropharynx is clear and moist and mucous membranes are normal.  Eyes: Pupils are equal, round, and reactive to light. Conjunctivae are normal.  Cardiovascular: Normal rate and regular rhythm.  No murmur heard. Pulses:      Dorsalis pedis pulses are 2+ on the right side and 2+ on the left side.  Respiratory: Effort normal and breath sounds normal. No respiratory distress.  GI: Soft. He exhibits no mass. There is no hepatomegaly. There is no abdominal tenderness.  Musculoskeletal:        General: Edema (1+ pitting LE edema,bilateral.) present.  Lymphadenopathy:    He has no cervical adenopathy.  Neurological: He is alert and oriented to person, place, and time. He has normal strength. No cranial nerve deficit. Gait normal.  Skin: Skin is warm. No rash noted. No erythema.  Psychiatric: He has a normal mood and affect.  Well groomed, good eye contact.     ASSESSMENT AND PLAN:   David [name] was seen today for 6 months follow-up.  Orders Placed This Encounter  Procedures  . VITAMIN D 25 Hydroxy (Vit-D Deficiency, Fractures)  . Hepatic function panel  . PTH, intact and calcium  . Basic metabolic panel  . CBC  . Hemoglobin A1c   Lab Results  Component Value Date   HGBA1C 5.9  04/06/2018   Lab Results  Component Value Date   ALT 21 04/06/2018   AST 21 04/06/2018   GGT 29 11/02/2016   ALKPHOS 112 04/06/2018   BILITOT 0.8 04/06/2018   Lab Results  Component Value Date   CREATININE 0.95 04/06/2018   BUN 22 04/06/2018   NA 144 04/06/2018   K 4.5 04/06/2018   CL 110 04/06/2018   CO2 26 04/06/2018   Lab Results  Component Value Date   WBC 6.2 04/06/2018   HGB 14.3 04/06/2018   HCT 42.5 04/06/2018   MCV 93.7 04/06/2018   PLT 210.0 04/06/2018    1. Generalized osteoarthritis of multiple sites Well controlled with Cymbalta 30 mg,so no changes. Tylenol 500 mg tid as needed.  2. Essential hypertension Adequately controlled. No changes in current management. DASH/low salt diet recommended. Eye exam recommended annually. F/U in 6 months, before if needed.  3. Vitamin D deficiency, unspecified No changes in current management, will follow labs done today and will give further recommendations accordingly.  4. Hypercalcemia Possible causes discussed. Lumbar X ray 09/2017 negative for lytic bone lesions. He has followed with urologist and reporting normal PSA. Further recommendations will be given according to labs results.  5. Elevated alkaline phosphatase level Mild and asymptomatic. Instructed about warning signs.  6. Prediabetes Primary prevention through a healthier lifestyle recommended.     Return in about 6 months (around 10/05/2018) for CPE.   David Lubeck G. [name], MD  [facility]. [place] office.

## 2018-04-06 NOTE — Patient Instructions (Signed)
A few things to remember from today's visit:   Generalized osteoarthritis of multiple sites  Essential hypertension - Plan: Basic metabolic panel  Vitamin D deficiency, unspecified - Plan: VITAMIN D 25 Hydroxy (Vit-D Deficiency, Fractures)  Hypercalcemia - Plan: PTH, intact and calcium, CBC  Elevated alkaline phosphatase level - Plan: Hepatic function panel   Please be sure medication list is accurate. If a new problem present, please set up appointment sooner than planned today.

## 2018-04-08 ENCOUNTER — Encounter: Payer: Self-pay | Admitting: Family Medicine

## 2018-04-09 LAB — PTH, INTACT AND CALCIUM
CALCIUM: 10.1 mg/dL (ref 8.6–10.3)
PTH: 141 pg/mL — AB (ref 14–64)

## 2018-04-17 DIAGNOSIS — D3001 Benign neoplasm of right kidney: Secondary | ICD-10-CM | POA: Diagnosis not present

## 2018-04-17 DIAGNOSIS — N200 Calculus of kidney: Secondary | ICD-10-CM | POA: Diagnosis not present

## 2018-04-30 ENCOUNTER — Other Ambulatory Visit: Payer: Self-pay | Admitting: Family Medicine

## 2018-04-30 DIAGNOSIS — K219 Gastro-esophageal reflux disease without esophagitis: Secondary | ICD-10-CM

## 2018-05-04 DIAGNOSIS — M6289 Other specified disorders of muscle: Secondary | ICD-10-CM | POA: Diagnosis not present

## 2018-05-04 DIAGNOSIS — M6281 Muscle weakness (generalized): Secondary | ICD-10-CM | POA: Diagnosis not present

## 2018-05-10 ENCOUNTER — Other Ambulatory Visit: Payer: Self-pay | Admitting: Family Medicine

## 2018-05-10 DIAGNOSIS — M6289 Other specified disorders of muscle: Secondary | ICD-10-CM | POA: Diagnosis not present

## 2018-05-10 DIAGNOSIS — M6281 Muscle weakness (generalized): Secondary | ICD-10-CM | POA: Diagnosis not present

## 2018-05-10 DIAGNOSIS — J309 Allergic rhinitis, unspecified: Secondary | ICD-10-CM

## 2018-08-01 DIAGNOSIS — M6281 Muscle weakness (generalized): Secondary | ICD-10-CM | POA: Diagnosis not present

## 2018-08-01 DIAGNOSIS — M62838 Other muscle spasm: Secondary | ICD-10-CM | POA: Diagnosis not present

## 2018-08-08 ENCOUNTER — Other Ambulatory Visit: Payer: Self-pay | Admitting: Family Medicine

## 2018-08-08 DIAGNOSIS — K219 Gastro-esophageal reflux disease without esophagitis: Secondary | ICD-10-CM

## 2018-08-14 DIAGNOSIS — M6281 Muscle weakness (generalized): Secondary | ICD-10-CM | POA: Diagnosis not present

## 2018-08-14 DIAGNOSIS — M62838 Other muscle spasm: Secondary | ICD-10-CM | POA: Diagnosis not present

## 2018-09-04 DIAGNOSIS — M62838 Other muscle spasm: Secondary | ICD-10-CM | POA: Diagnosis not present

## 2018-09-04 DIAGNOSIS — M6281 Muscle weakness (generalized): Secondary | ICD-10-CM | POA: Diagnosis not present

## 2018-09-17 DIAGNOSIS — D692 Other nonthrombocytopenic purpura: Secondary | ICD-10-CM | POA: Diagnosis not present

## 2018-09-17 DIAGNOSIS — L812 Freckles: Secondary | ICD-10-CM | POA: Diagnosis not present

## 2018-09-17 DIAGNOSIS — Z85828 Personal history of other malignant neoplasm of skin: Secondary | ICD-10-CM | POA: Diagnosis not present

## 2018-09-17 DIAGNOSIS — L570 Actinic keratosis: Secondary | ICD-10-CM | POA: Diagnosis not present

## 2018-09-17 DIAGNOSIS — L821 Other seborrheic keratosis: Secondary | ICD-10-CM | POA: Diagnosis not present

## 2018-09-24 DIAGNOSIS — M62838 Other muscle spasm: Secondary | ICD-10-CM | POA: Diagnosis not present

## 2018-09-24 DIAGNOSIS — M6281 Muscle weakness (generalized): Secondary | ICD-10-CM | POA: Diagnosis not present

## 2018-10-02 ENCOUNTER — Ambulatory Visit: Payer: Medicare Other

## 2018-10-03 DIAGNOSIS — H1045 Other chronic allergic conjunctivitis: Secondary | ICD-10-CM | POA: Diagnosis not present

## 2018-10-03 DIAGNOSIS — H524 Presbyopia: Secondary | ICD-10-CM | POA: Diagnosis not present

## 2018-10-05 ENCOUNTER — Encounter: Payer: Self-pay | Admitting: Family Medicine

## 2018-10-05 ENCOUNTER — Ambulatory Visit (INDEPENDENT_AMBULATORY_CARE_PROVIDER_SITE_OTHER): Payer: Medicare Other | Admitting: Family Medicine

## 2018-10-05 ENCOUNTER — Other Ambulatory Visit: Payer: Self-pay

## 2018-10-05 VITALS — BP 122/76 | HR 82 | Temp 97.6°F | Resp 12 | Ht 72.0 in | Wt 208.1 lb

## 2018-10-05 DIAGNOSIS — I10 Essential (primary) hypertension: Secondary | ICD-10-CM

## 2018-10-05 DIAGNOSIS — M19079 Primary osteoarthritis, unspecified ankle and foot: Secondary | ICD-10-CM

## 2018-10-05 DIAGNOSIS — E213 Hyperparathyroidism, unspecified: Secondary | ICD-10-CM

## 2018-10-05 DIAGNOSIS — Z0000 Encounter for general adult medical examination without abnormal findings: Secondary | ICD-10-CM | POA: Diagnosis not present

## 2018-10-05 DIAGNOSIS — M159 Polyosteoarthritis, unspecified: Secondary | ICD-10-CM

## 2018-10-05 LAB — BASIC METABOLIC PANEL
BUN: 23 mg/dL (ref 6–23)
CO2: 25 mEq/L (ref 19–32)
Calcium: 10.3 mg/dL (ref 8.4–10.5)
Chloride: 110 mEq/L (ref 96–112)
Creatinine, Ser: 1.02 mg/dL (ref 0.40–1.50)
GFR: 70.73 mL/min (ref >60.00)
Glucose, Bld: 99 mg/dL (ref 70–99)
Potassium: 4.9 mEq/L (ref 3.5–5.1)
Sodium: 143 mEq/L (ref 135–145)

## 2018-10-05 LAB — VITAMIN D 25 HYDROXY (VIT D DEFICIENCY, FRACTURES): VITD: 48.53 ng/mL (ref 30.00–100.00)

## 2018-10-05 NOTE — Progress Notes (Signed)
HPI:   [name] is a 78 y.o. male, who is here today for chronic disease management. He was last seen on 04/06/18.  No new problems since his last OV.  HTN: Currently he is on Amlodipine-Olmesartan 5-20 mg daily. Denies severe/frequent headache, visual changes, chest pain, dyspnea, palpitation, claudication, focal weakness, or edema.  He checks BP regularly and it has been "good."   Component     Latest Ref Rng & Units 04/06/2018         9:01 AM  Sodium     135 - 145 mEq/L 144  Potassium     3.5 - 5.1 mEq/L 4.5  Chloride     96 - 112 mEq/L 110  CO2     19 - 32 mEq/L 26  Glucose     70 - 99 mg/dL 91  BUN     6 - 23 mg/dL 22  Creatinine     0.40 - 1.50 mg/dL 0.95  Calcium     8.4 - 10.5 mg/dL 9.9  GFR     >60.00 mL/min 76.88    "Something" in right foot,3 months. Right MTP joint of great toe. No Hx of trauma. Pain is exacerbated by prolonged walking. No edema or erythema.   Generalized OA: Currently he is on Cymbalta 30 mg daily. According to pt, his ortho recommended TKR, right first, but he is not interested in doing so for now.  He follows with ortho for knee pain,R>L. According to pt,he was told he needs TKR. Also affected shoulders, wrists, and occasionally upper back. Pain is worse with walking,prolonged standing,and yard work. Alleviated by rest.  Vit D deficiency, he is on Vit D 2000 U daily. Elevated PTH at 141 in 03/2017. He has not noted confusion, numbness,tingling,or muscle cramps.  C/O having "a lot of gas", which is not a new problem. He has not identifies exacerbating or alleviating factors. Hx of GERD. He has not had abdominal pain,changes in bowel habits,N/V.  He also would like to have his AWV, states that he was supposed to have it done last month.   He lives with his wife.. Independent ADL's and IADL's. No falls in the past year and denies depression symptoms.  He exercises daily for up to 30 min,"pelvic  floor" exercises. He follows a healthful diet in general.   Functional Status Survey: Is the patient deaf or have difficulty hearing?: No Does the patient have difficulty seeing, even when wearing glasses/contacts?: No Does the patient have difficulty concentrating, remembering, or making decisions?: No Does the patient have difficulty walking or climbing stairs?: No Does the patient have difficulty dressing or bathing?: No Does the patient have difficulty doing errands alone such as visiting a doctor's office or shopping?: No  Fall Risk  10/05/2018 09/26/2017 09/23/2016 06/25/2015 05/21/2014  Falls in the past year? 0 No No No No  Number falls in past yr: 0 - - - -  Injury with Fall? 0 - - - -  Risk for fall due to : Orthopedic patient - - - -  Follow up Education provided - - - -    He was evaluated in the ER on 08/15/18 because ureteral colic,Hx of nephrolithiasis. Symptoms resolved after passing stone.  Providers he sees regularly: Eye care provider: Dr Marica Otter.Last eye exam 2-3 days ago. Cardiologist,Dr Stanford Breed. GI,Dr Havery Moros. Sherlene Shams  Depression screen [facility] 2/9 10/05/2018  Decreased Interest 0  Down, Depressed, Hopeless 0  PHQ - 2  Score 0    Mini-Cog - 10/05/18 1908    Normal clock drawing test?  yes    How many words correct?  3        Hearing Screening   125Hz  250Hz  500Hz  1000Hz  2000Hz  3000Hz  4000Hz  6000Hz  8000Hz   Right ear:           Left ear:           Vision Screening Comments: Refused,eye exam a few days ago.   Review of Systems  Constitutional: Negative for activity change, appetite change, fatigue, fever and unexpected weight change.  HENT: Negative for nosebleeds, sore throat and trouble swallowing.   Eyes: Negative for redness and visual disturbance.  Respiratory: Negative for cough and wheezing.   Gastrointestinal: Negative for abdominal pain, nausea and vomiting.  Endocrine: Negative for cold intolerance and heat intolerance.   Genitourinary: Negative for decreased urine volume, dysuria and hematuria.  Musculoskeletal: Positive for arthralgias and back pain. Negative for gait problem.  Skin: Negative for color change and rash.  Allergic/Immunologic: Positive for environmental allergies.  Neurological: Negative for syncope, weakness and headaches.  Psychiatric/Behavioral: Negative for confusion.  Rest see pertinent positives and negatives per HPI.   Current Outpatient Medications on File Prior to Visit  Medication Sig Dispense Refill  . amLODipine-olmesartan (AZOR) 5-20 MG tablet TAKE 1 TABLET BY MOUTH DAILY 90 tablet 3  . aspirin EC 81 MG tablet Take 81 mg by mouth daily.    [name] atorvastatin (LIPITOR) 40 MG tablet TAKE 1 TABLET BY MOUTH ONCE DAILY AT 6:00 PM 90 tablet 3  . cetirizine (ZYRTEC) 10 MG tablet Take 1 tablet (10 mg total) by mouth daily. 90 tablet 2  . DULoxetine (CYMBALTA) 30 MG capsule Take 1 capsule (30 mg total) by mouth daily. 90 capsule 2  . fluticasone (FLONASE) 50 MCG/ACT nasal spray SHAKE LIQUID AND USE 2 SPRAYS IN EACH NOSTRIL DAILY 48 g 1  . Iodoquinol-HC (HYDROCORTISONE-IODOQUINOL) 1-1 % CREA APP ON THE SKIN BID  2  . omeprazole (PRILOSEC) 20 MG capsule TAKE 1 CAPSULE BY MOUTH EVERY DAY BEFORE BREAKFAST 90 capsule 0  . sodium chloride (OCEAN) 0.65 % nasal spray Place 1 spray into the nose daily as needed for congestion.    [name] VITAMIN D, CHOLECALCIFEROL, PO Take 1 tablet by mouth daily.     No current facility-administered medications on file prior to visit.      Past Medical History:  Diagnosis Date  . Allergy   . Arthritis    gen.  and left hip  . BPH (benign prostatic hypertrophy)   . GERD (gastroesophageal reflux disease)    occasional  . History of kidney stones    2007  . Hyperlipidemia   . Hypertension   . Kidney tumor   . LBP (low back pain) 06/2005   L5 radicular symptoms; MRI of LS spine severe spondylosis at L4-L5 with central cancal stenosis   . Nocturia   . Organic  impotence   . Prostate cancer ([place]) 10/2011   (Low grade) [facility] - Dr. Junious Silk  . Pyelonephritis 03/05/2013  . Renal oncocytoma of right kidney 01/23/2013   As 3.2 x 3.5 cm enhancing exophytic mass projecting off the lower pole. This is consistent with a renal cell carcinoma. No retroperitoneal lymphadenopathy.   [name] Spermatocele   . 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 Wears glasses    Allergies  Allergen Reactions  . Codeine Itching    Severe hiccups  . Ace Inhibitors Other (See Comments)  REACTION: Cough  . Percocet [Oxycodone-Acetaminophen]     Severe hiccups    Social History   Socioeconomic History  . Marital status: Married    Spouse name: Not on file  . Number of children: 2  . Years of education: Not on file  . Highest education level: Not on file  Occupational History  . Not on file  Social Needs  . Financial resource strain: Not on file  . Food insecurity    Worry: Not on file    Inability: Not on file  . Transportation needs    Medical: Not on file    Non-medical: Not on file  Tobacco Use  . Smoking status: Former Smoker    Packs/day: 0.25    Years: 10.00    Pack years: 2.50    Types: Cigarettes    Quit date: 02/28/1981    Years since quitting: 37.6  . Smokeless tobacco: Never Used  Substance and Sexual Activity  . Alcohol use: Yes    Alcohol/week: 1.0 standard drinks    Types: 1 Glasses of wine per week  . Drug use: Not Currently    Types: Flunitrazepam  . Sexual activity: Not on file  Lifestyle  . Physical activity    Days per week: Not on file    Minutes per session: Not on file  . Stress: Not on file  Relationships  . Social Herbalist on phone: Not on file    Gets together: Not on file    Attends religious service: Not on file    Active member of club or organization: Not on file    Attends meetings of clubs or organizations: Not on file    Relationship status: Not on file  Other Topics Concern  . Not on file  Social History  Narrative   Married- 47 yrs   2 daughters   5 grandchildren   Never Smoked   Alcohol use-yes (occasional/social)   Retired- Engineer, water      Physician roster:      Urologist - Dr. [name]   Orthopedics - Dr. [name]   Dermatology - Dr. [name]    Vitals:   10/05/18 0851  BP: 122/76  Pulse: 82  Resp: 12  Temp: 97.6 F (36.4 C)   Body mass index is 28.23 kg/m.  Wt Readings from Last 3 Encounters:  10/05/18 208 lb 2 oz (94.4 kg)  04/06/18 210 lb 6.4 oz (95.4 kg)  02/12/18 203 lb (92.1 kg)     Physical Exam  Nursing note reviewed. Constitutional: He is oriented to person, place, and time. He appears well-developed and well-nourished. No distress.  HENT:  Head: Normocephalic and atraumatic.  Mouth/Throat: Oropharynx is clear and moist and mucous membranes are normal.  Eyes: Pupils are equal, round, and reactive to light. Conjunctivae are normal.  Cardiovascular: Normal rate and regular rhythm.  No murmur heard. Pulses:      Dorsalis pedis pulses are 2+ on the right side and 2+ on the left side.  Respiratory: Effort normal and breath sounds normal. No respiratory distress.  GI: Soft. He exhibits no mass. There is no hepatomegaly. There is no abdominal tenderness.  Musculoskeletal:        General: No edema.     Comments: Pain upon palpation of first MTP joint right great toe. No edema,erythema,or deformity. No signs of synovitis.   Lymphadenopathy:    He has no cervical adenopathy.  Neurological: He is alert and oriented to  person, place, and time. He has normal strength. No cranial nerve deficit. Gait normal.  Skin: Skin is warm. No rash noted. No erythema.  Psychiatric: He has a normal mood and affect. Cognition and memory are normal.  Well groomed, good eye contact.    ASSESSMENT AND PLAN:  Mr. Jacksyn was seen today for follow-up.  Diagnoses and all orders for this visit: Lab Results  Component Value Date   CREATININE 1.02 10/05/2018   BUN 23  10/05/2018   NA 143 10/05/2018   K 4.9 10/05/2018   CL 110 10/05/2018   CO2 25 10/05/2018    Generalized osteoarthritis of multiple sites Still having joint pain but less since he started Cymbalta,he is not interested in increasing dose. No changes in current management.  Essential hypertension Adequately controlled. No changes in current management. Low salt diet to continue. Monitor BP regularly. Eye exam recommended annually. F/U in 6 months, before if needed.  Hyperparathyroidism ([place]) Ca++ has been elevated intermittently since 2016,mid 10's. Asymptomatic. Further recommendations will be given according to labs results.  -     Basic metabolic panel -     VITAMIN D 25 Hydroxy (Vit-D Deficiency, Fractures) -     Parathyroid hormone, intact (no Ca)  Arthritis of first MTP joint Possible etiologies discussed. ? OA. If pain is persistent recommend following with podiatrist.  Medicare annual wellness visit, subsequent We discussed the importance of staying active, physically and mentally, as well as the benefits of a healthy/balance diet. Low impact exercise that involve stretching and strengthing are ideal. Vaccines up to date. We discussed preventive screening for the next 5-10 years, summery of recommendations given in AVS:  Colonoscopy due in 01/2021. Fall prevention.  Advance directives and end of life discussed, he has POA and living will.   Return in about 6 months (around 04/07/2019) for f/u.   -[name] was advised to return sooner than planned today if new concerns arise.     Korbyn Chopin G. [name], MD  [facility]. [place] office.

## 2018-10-05 NOTE — Patient Instructions (Signed)
  David [name] , Thank you for taking time to come for your Medicare Wellness Visit. I appreciate your ongoing commitment to your health goals. Please review the following plan we discussed and let me know if I can assist you in the future.   These are the goals we discussed: Goals    . Patient Stated     Maintain your weight to <200lbs    . Weight (lb) < 200 lb (90.7 kg)     Maintain weight well below 200 lbs. Watch diet and be as active as possible.        This is a list of the screening recommended for you and due dates:  Health Maintenance  Topic Date Due  . Complete foot exam   09/27/2018  . Flu Shot  09/29/2018  . Eye exam for diabetics  09/29/2018  . Hemoglobin A1C  10/05/2018  . Colon Cancer Screening  02/12/2021  . Pneumonia vaccines  Completed  . Tetanus Vaccine  Discontinued

## 2018-10-07 ENCOUNTER — Encounter: Payer: Self-pay | Admitting: Family Medicine

## 2018-10-08 LAB — PARATHYROID HORMONE, INTACT (NO CA): PTH: 68 pg/mL — ABNORMAL HIGH (ref 14–64)

## 2018-10-29 ENCOUNTER — Other Ambulatory Visit: Payer: Self-pay | Admitting: Family Medicine

## 2018-10-29 DIAGNOSIS — M159 Polyosteoarthritis, unspecified: Secondary | ICD-10-CM

## 2018-10-29 DIAGNOSIS — K219 Gastro-esophageal reflux disease without esophagitis: Secondary | ICD-10-CM

## 2018-11-03 ENCOUNTER — Other Ambulatory Visit: Payer: Self-pay | Admitting: Family Medicine

## 2018-11-03 DIAGNOSIS — J309 Allergic rhinitis, unspecified: Secondary | ICD-10-CM

## 2018-12-26 ENCOUNTER — Telehealth: Payer: Self-pay | Admitting: Emergency Medicine

## 2018-12-26 NOTE — Telephone Encounter (Signed)
Pts spouse Timothee Sisney is your patient. You both talked about her husband in her last appt. Pt is wondering if you would take him on as a patient as well. Please advise thanks.

## 2018-12-26 NOTE — Telephone Encounter (Signed)
Ok Thx 

## 2019-01-10 ENCOUNTER — Other Ambulatory Visit (INDEPENDENT_AMBULATORY_CARE_PROVIDER_SITE_OTHER): Payer: Medicare Other

## 2019-01-10 ENCOUNTER — Ambulatory Visit (INDEPENDENT_AMBULATORY_CARE_PROVIDER_SITE_OTHER)
Admission: RE | Admit: 2019-01-10 | Discharge: 2019-01-10 | Disposition: A | Discharge status: Home / Self Care | Payer: Medicare Other | Source: Ambulatory Visit | Attending: Internal Medicine | Admitting: Internal Medicine

## 2019-01-10 ENCOUNTER — Ambulatory Visit (INDEPENDENT_AMBULATORY_CARE_PROVIDER_SITE_OTHER): Payer: Medicare Other | Admitting: Internal Medicine

## 2019-01-10 ENCOUNTER — Encounter: Payer: Self-pay | Admitting: Internal Medicine

## 2019-01-10 ENCOUNTER — Other Ambulatory Visit: Payer: Self-pay

## 2019-01-10 DIAGNOSIS — I6523 Occlusion and stenosis of bilateral carotid arteries: Secondary | ICD-10-CM | POA: Insufficient documentation

## 2019-01-10 DIAGNOSIS — R059 Cough, unspecified: Secondary | ICD-10-CM

## 2019-01-10 DIAGNOSIS — C61 Malignant neoplasm of prostate: Secondary | ICD-10-CM

## 2019-01-10 DIAGNOSIS — R739 Hyperglycemia, unspecified: Secondary | ICD-10-CM

## 2019-01-10 DIAGNOSIS — R05 Cough: Secondary | ICD-10-CM

## 2019-01-10 DIAGNOSIS — E559 Vitamin D deficiency, unspecified: Secondary | ICD-10-CM | POA: Diagnosis not present

## 2019-01-10 DIAGNOSIS — R0989 Other specified symptoms and signs involving the circulatory and respiratory systems: Secondary | ICD-10-CM | POA: Insufficient documentation

## 2019-01-10 DIAGNOSIS — I10 Essential (primary) hypertension: Secondary | ICD-10-CM | POA: Diagnosis not present

## 2019-01-10 DIAGNOSIS — Z87891 Personal history of nicotine dependence: Secondary | ICD-10-CM | POA: Diagnosis not present

## 2019-01-10 LAB — BASIC METABOLIC PANEL
BUN: 27 mg/dL — ABNORMAL HIGH (ref 6–23)
CO2: 24 mEq/L (ref 19–32)
Calcium: 10.1 mg/dL (ref 8.4–10.5)
Chloride: 110 mEq/L (ref 96–112)
Creatinine, Ser: 1.04 mg/dL (ref 0.40–1.50)
GFR: 69.11 mL/min (ref >60.00)
Glucose, Bld: 97 mg/dL (ref 70–99)
Potassium: 4.2 mEq/L (ref 3.5–5.1)
Sodium: 141 mEq/L (ref 135–145)

## 2019-01-10 LAB — HEPATIC FUNCTION PANEL
ALT: 30 U/L (ref 0–53)
AST: 25 U/L (ref 0–37)
Albumin: 4.2 g/dL (ref 3.5–5.2)
Alkaline Phosphatase: 115 U/L (ref 39–117)
Bilirubin, Direct: 0.1 mg/dL (ref 0.0–0.3)
Total Bilirubin: 0.6 mg/dL (ref 0.2–1.2)
Total Protein: 7.1 g/dL (ref 6.0–8.3)

## 2019-01-10 LAB — HEMOGLOBIN A1C: Hgb A1c MFr Bld: 6 % (ref 4.6–6.5)

## 2019-01-10 MED ORDER — PANTOPRAZOLE SODIUM 40 MG PO TBEC: 40.0000 mg | DELAYED_RELEASE_TABLET | Freq: Two times a day (BID) | ORAL | 11 refills | Status: DC

## 2019-01-10 NOTE — Progress Notes (Signed)
Subjective:  Patient ID: David [name], male    DOB: 11-14-1940  Age: 78 y.o. MRN: [id number]  CC: No chief complaint on file.   HPI David [name] presents to est care C/o knee OA, HTN He is on Cymbalta x 4-5 y for knee pain  Outpatient Medications Prior to Visit  Medication Sig Dispense Refill   amLODipine-olmesartan (AZOR) 5-20 MG tablet TAKE 1 TABLET BY MOUTH DAILY 90 tablet 3   aspirin EC 81 MG tablet Take 81 mg by mouth daily.     atorvastatin (LIPITOR) 40 MG tablet TAKE 1 TABLET BY MOUTH ONCE DAILY AT 6:00 PM 90 tablet 3   cetirizine (ZYRTEC) 10 MG tablet Take 1 tablet (10 mg total) by mouth daily. 90 tablet 2   DULoxetine (CYMBALTA) 30 MG capsule TAKE 1 CAPSULE(30 MG) BY MOUTH DAILY 90 capsule 2   fluticasone (FLONASE) 50 MCG/ACT nasal spray SHAKE LIQUID AND USE 2 SPRAYS IN EACH NOSTRIL DAILY 48 g 1   Iodoquinol-HC (HYDROCORTISONE-IODOQUINOL) 1-1 % CREA APP ON THE SKIN BID  2   omeprazole (PRILOSEC) 20 MG capsule TAKE 1 CAPSULE BY MOUTH EVERY DAY BEFORE BREAKFAST 90 capsule 0   sodium chloride (OCEAN) 0.65 % nasal spray Place 1 spray into the nose daily as needed for congestion.     VITAMIN D, CHOLECALCIFEROL, PO Take 1 tablet by mouth daily.     No facility-administered medications prior to visit.     ROS: Review of Systems  Constitutional: Positive for unexpected weight change. Negative for appetite change and fatigue.  HENT: Negative for congestion, nosebleeds, sneezing, sore throat and trouble swallowing.   Eyes: Negative for itching and visual disturbance.  Respiratory: Negative for cough.   Cardiovascular: Negative for chest pain, palpitations and leg swelling.  Gastrointestinal: Negative for abdominal distention, blood in stool, diarrhea and nausea.  Genitourinary: Negative for frequency and hematuria.  Musculoskeletal: Positive for arthralgias and gait problem. Negative for back pain, joint swelling and neck pain.  Skin: Negative for rash.    Neurological: Negative for dizziness, tremors, speech difficulty and weakness.  Psychiatric/Behavioral: Negative for agitation, dysphoric mood, sleep disturbance and suicidal ideas. The patient is not nervous/anxious.     Objective:  BP 128/72 (BP Location: Left Arm, Patient Position: Sitting, Cuff Size: Large)    Pulse 62    Temp 98.7 F (37.1 C) (Oral)    Ht 6' (1.829 m)    Wt 214 lb (97.1 kg)    SpO2 96%    BMI 29.02 kg/m   BP Readings from Last 3 Encounters:  01/10/19 128/72  10/05/18 122/76  04/06/18 120/64    Wt Readings from Last 3 Encounters:  01/10/19 214 lb (97.1 kg)  10/05/18 208 lb 2 oz (94.4 kg)  04/06/18 210 lb 6.4 oz (95.4 kg)    Physical Exam Constitutional:      General: He is not in acute distress.    Appearance: He is well-developed. He is obese.     Comments: NAD  Eyes:     Conjunctiva/sclera: Conjunctivae normal.     Pupils: Pupils are equal, round, and reactive to light.  Neck:     Musculoskeletal: Normal range of motion.     Thyroid: No thyromegaly.     Vascular: No JVD.  Cardiovascular:     Rate and Rhythm: Normal rate and regular rhythm.     Heart sounds: Normal heart sounds. No murmur. No friction rub. No gallop.   Pulmonary:     Effort:  Pulmonary effort is normal. No respiratory distress.     Breath sounds: Normal breath sounds. No wheezing or rales.  Chest:     Chest wall: No tenderness.  Abdominal:     General: Bowel sounds are normal. There is no distension.     Palpations: Abdomen is soft. There is no mass.     Tenderness: There is no abdominal tenderness. There is no guarding or rebound.  Musculoskeletal: Normal range of motion.        General: No tenderness.  Lymphadenopathy:     Cervical: No cervical adenopathy.  Skin:    General: Skin is warm and dry.     Findings: No rash.  Neurological:     Mental Status: He is alert and oriented to person, place, and time.     Cranial Nerves: No cranial nerve deficit.     Motor: No  abnormal muscle tone.     Coordination: Coordination normal.     Gait: Gait normal.     Deep Tendon Reflexes: Reflexes are normal and symmetric.  Psychiatric:        Behavior: Behavior normal.        Thought Content: Thought content normal.        Judgment: Judgment normal.     Lab Results  Component Value Date   WBC 6.2 04/06/2018   HGB 14.3 04/06/2018   HCT 42.5 04/06/2018   PLT 210.0 04/06/2018   GLUCOSE 99 10/05/2018   CHOL 154 10/04/2017   TRIG 68.0 10/04/2017   HDL 45.50 10/04/2017   LDLDIRECT 122.1 03/05/2012   LDLCALC 95 10/04/2017   ALT 21 04/06/2018   AST 21 04/06/2018   NA 143 10/05/2018   K 4.9 10/05/2018   CL 110 10/05/2018   CREATININE 1.02 10/05/2018   BUN 23 10/05/2018   CO2 25 10/05/2018   TSH 1.24 11/17/2014   PSA 0.047 04/08/2017   INR 1.26 03/03/2013   HGBA1C 5.9 04/06/2018    Ct Renal Stone Study  Result Date: 08/14/2017 CLINICAL DATA:  Left flank pain since 20/2 30 hours on 08/13/2017. Microhematuria. Previous history of hypertension, kidney stones, right partial nephrectomy due to cancer, pyelonephritis, prostate cancer. EXAM: CT ABDOMEN AND PELVIS WITHOUT CONTRAST TECHNIQUE: Multidetector CT imaging of the abdomen and pelvis was performed following the standard protocol without IV contrast. COMPARISON:  01/28/2014 FINDINGS: Lower chest: Infiltration or atelectasis in both lung bases, greater on the right. Small to moderate esophageal hiatal hernia. Coronary artery calcifications. Hepatobiliary: No focal liver abnormality is seen. No gallstones, gallbladder wall thickening, or biliary dilatation. Pancreas: Unremarkable. No pancreatic ductal dilatation or surrounding inflammatory changes. Spleen: Normal in size without focal abnormality. Adrenals/Urinary Tract: No adrenal gland nodules. 4 mm stone in the left ureteropelvic junction with mild left pyelocaliectasis. Distal left ureter is decompressed. Additional nonobstructing intrarenal stones in both  kidneys, largest in the left upper pole measuring 7 mm diameter. No hydronephrosis or hydroureter on the right. Bladder wall is not thickened. No bladder filling defects. Stomach/Bowel: Stomach is within normal limits. Appendix appears normal. No evidence of bowel wall thickening, distention, or inflammatory changes. Vascular/Lymphatic: Aortic atherosclerosis. No enlarged abdominal or pelvic lymph nodes. Reproductive: Surgical absence of the prostate gland. Other: Postoperative scarring in the anterior abdominal wall. No free air or free fluid in the abdomen. Musculoskeletal: Degenerative changes in the spine. No destructive bone lesions. IMPRESSION: 1. 4 mm stone in the left ureteropelvic junction with mild proximal obstruction. 2. Multiple additional nonobstructing intrarenal stones bilaterally. 3. Infiltration  or atelectasis in both lungs, greater on the right. 4. Small to moderate esophageal hiatal hernia. Electronically Signed   By: Lucienne Capers M.D.   On: 08/14/2017 04:51    Assessment & Plan:   There are no diagnoses linked to this encounter.   No orders of the defined types were placed in this encounter.    Follow-up: No follow-ups on file.  Walker Kehr, MD

## 2019-01-10 NOTE — Assessment & Plan Note (Signed)
Treat GERD CXR

## 2019-01-10 NOTE — Assessment & Plan Note (Signed)
B [place]

## 2019-01-10 NOTE — Assessment & Plan Note (Signed)
01/2013 Dr [name]: prostatectomy

## 2019-01-10 NOTE — Patient Instructions (Addendum)
  These suggestions will probably help you to improve your metabolism if you are not overweight and to lose weight if you are overweight: 1.  Reduce your consumption of sugars and starches.  Eliminate high fructose corn syrup from your diet.  Reduce your consumption of processed foods.  For desserts try to have seasonal fruits, berries, nuts, cheeses or dark chocolate with more than 70% cacao. 2.  Do not snack 3.  You do not have to eat breakfast.  If you choose to have breakfast-eat plain greek yogurt, eggs, oatmeal (without sugar) 4.  Drink water, freshly brewed unsweetened tea (green, black or herbal) or coffee.  Do not drink sodas including diet sodas , juices, beverages sweetened with artificial sweeteners. 5.  Reduce your consumption of refined grains. 6.  Avoid protein drinks such as Optifast, Slim fast etc. Eat chicken, fish, meat, dairy and beans for your sources of protein 7.  Natural unprocessed fats like cold pressed virgin olive oil, butter, coconut oil are good for you.  Eat avocados 8.  Increase your consumption of fiber.  Fruits, berries, vegetables, whole grains, flaxseeds, Chia seeds, beans, popcorn, nuts, oatmeal are good sources of fiber 9.  Use vinegar in your diet, i.e. apple cider vinegar, red wine or balsamic vinegar 10.  You can try fasting.  For example you can skip breakfast and lunch every other day (24-hour fast) 11.  Stress reduction, good night sleep, relaxation, meditation, yoga and other physical activity is likely to help you to maintain low weight too. 12.  If you drink alcohol, limit your alcohol intake to no more than 2 drinks a day.     Cabbage soup recipe that will not make you gain weight: Take 1 small head of cabbage, 1 average pack of celery, 4 green peppers, 4 onions, 2 cans diced tomatoes (they are not available without salt), salt and spices to taste.  Chop cabbage, celery, peppers and onions.  And tomatoes and 2-2.5 liters (2.5 quarts) of water so that  it would just cover the vegetables.  Bring to boil.  Add spices and salt.  Turn heat to low/medium and simmer for 20-25 minutes.  Naturally, you can make a smaller batch and change some of the ingredients    If you have medicare related insurance (such as traditional Medicare, [employer], [employer], or similar), Please make an appointment at the scheduling desk with Sharee Pimple, the [employer], for your Wellness visit in this office, which is a benefit with your insurance.

## 2019-01-10 NOTE — Assessment & Plan Note (Signed)
Labs Wt loss 

## 2019-01-10 NOTE — Assessment & Plan Note (Signed)
Vit D 

## 2019-01-15 NOTE — Addendum Note (Signed)
Addended by: Karren Cobble on: 01/15/2019 02:58 PM   Modules accepted: Orders

## 2019-01-28 ENCOUNTER — Other Ambulatory Visit: Payer: Self-pay | Admitting: Family Medicine

## 2019-01-28 DIAGNOSIS — K219 Gastro-esophageal reflux disease without esophagitis: Secondary | ICD-10-CM

## 2019-01-28 NOTE — Progress Notes (Signed)
HPI: FU dyspnea. Abdominal CT December 2015 showed no aneurysm but there was note of coronary calcification. Chest CT April 2018 showed a 5 mm right lower lobe pulmonary nodule unchanged and no acute cardiopulmonary disease.  Echocardiogram May 2018 showed normal LV systolic function, mild diastolic dysfunction, trace aortic insufficiency, mildly dilated ascending aorta.  Exercise treadmill May 2018 negative.    Carotid Dopplers December 2020 showed 40 to 59% right and 1 to 39% left stenosis.  There was note of a right thyroid nodule.  Since last seen   Current Outpatient Medications  Medication Sig Dispense Refill  . amLODipine-olmesartan (AZOR) 5-20 MG tablet TAKE 1 TABLET BY MOUTH DAILY 90 tablet 3  . aspirin EC 81 MG tablet Take 81 mg by mouth daily.    [name] atorvastatin (LIPITOR) 40 MG tablet TAKE 1 TABLET BY MOUTH ONCE DAILY AT 6:00 PM 90 tablet 3  . cetirizine (ZYRTEC) 10 MG tablet Take 1 tablet (10 mg total) by mouth daily. 90 tablet 2  . DULoxetine (CYMBALTA) 30 MG capsule TAKE 1 CAPSULE(30 MG) BY MOUTH DAILY 90 capsule 2  . fluticasone (FLONASE) 50 MCG/ACT nasal spray SHAKE LIQUID AND USE 2 SPRAYS IN EACH NOSTRIL DAILY 48 g 1  . Iodoquinol-HC (HYDROCORTISONE-IODOQUINOL) 1-1 % CREA APP ON THE SKIN BID  2  . pantoprazole (PROTONIX) 40 MG tablet Take 1 tablet (40 mg total) by mouth 2 (two) times daily. 60 tablet 11  . sodium chloride (OCEAN) 0.65 % nasal spray Place 1 spray into the nose daily as needed for congestion.    [name] VITAMIN D, CHOLECALCIFEROL, PO Take 1 tablet by mouth daily.     No current facility-administered medications for this visit.      Past Medical History:  Diagnosis Date  . Allergy   . Arthritis    gen.  and left hip  . BPH (benign prostatic hypertrophy)   . GERD (gastroesophageal reflux disease)    occasional  . History of kidney stones    2007  . Hyperlipidemia   . Hypertension   . Kidney tumor   . LBP (low back pain) 06/2005   L5 radicular  symptoms; MRI of LS spine severe spondylosis at L4-L5 with central cancal stenosis   . Nocturia   . Organic impotence   . Prostate cancer ([place]) 10/2011   (Low grade) [facility] - Dr. Junious Silk  . Pyelonephritis 03/05/2013  . Renal oncocytoma of right kidney 01/23/2013   As 3.2 x 3.5 cm enhancing exophytic mass projecting off the lower pole. This is consistent with a renal cell carcinoma. No retroperitoneal lymphadenopathy.   [name] Spermatocele   . 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 Wears glasses     Past Surgical History:  Procedure Laterality Date  . COLONOSCOPY  02/15/2006, 2014   Internal hemorrhoids (Dr [name]), last in 2014  . INGUINAL HERNIA REPAIR Right 1980  . LYMPHADENECTOMY Bilateral 02/15/2013   Procedure: LYMPHADENECTOMY WITH INDOCYANINE GREEN DYE INJECTION;  Surgeon: [name], MD;  Location: [facility];  Service: Urology;  Laterality: Bilateral;  . PROSTATE BIOPSY N/A 12/18/2012   Procedure: BIOPSY TRANSRECTAL ULTRASONIC PROSTATE (TUBP);  Surgeon: [name], MD;  Location: [facility];  Service: Urology;  Laterality: N/A;  . PROSTATE BIOPSY  12/05/11   gleason 6, 3/12 cores  . REPAIR UMBILICAL AND VENTRAL HERNIA'S W/ MESH  08-21-2009  . ROBOT ASSISTED LAPAROSCOPIC RADICAL PROSTATECTOMY N/A 02/15/2013   Procedure: ROBOTIC ASSISTED LAPAROSCOPIC RADICAL PROSTATECTOMY;  Surgeon: [name],  MD;  Location: [facility];  Service: Urology;  Laterality: N/A;  . ROBOTIC ASSITED PARTIAL NEPHRECTOMY Right 02/15/2013   Procedure: ROBOTIC ASSITED PARTIAL NEPHRECTOMY;  Surgeon: [name], MD;  Location: [facility];  Service: Urology;  Laterality: Right;  . SPERMATOCELECTOMY Left 12/18/2012   Procedure: LEFT SPERMATOCELECTOMY;  Surgeon: [name], MD;  Location: [facility];  Service: Urology;  Laterality: Left;  . TONSILLECTOMY AND ADENOIDECTOMY  as child  . TRANSURETHRAL RESECTION OF PROSTATE  AGE 37    Social History   Socioeconomic  History  . Marital status: Married    Spouse name: Not on file  . Number of children: 2  . Years of education: Not on file  . Highest education level: Not on file  Occupational History  . Not on file  Social Needs  . Financial resource strain: Not on file  . Food insecurity    Worry: Not on file    Inability: Not on file  . Transportation needs    Medical: Not on file    Non-medical: Not on file  Tobacco Use  . Smoking status: Former Smoker    Packs/day: 0.25    Years: 10.00    Pack years: 2.50    Types: Cigarettes    Quit date: 02/28/1981    Years since quitting: 37.9  . Smokeless tobacco: Never Used  Substance and Sexual Activity  . Alcohol use: Yes    Alcohol/week: 1.0 standard drinks    Types: 1 Glasses of wine per week  . Drug use: Not Currently    Types: Flunitrazepam  . Sexual activity: Not on file  Lifestyle  . Physical activity    Days per week: Not on file    Minutes per session: Not on file  . Stress: Not on file  Relationships  . Social Herbalist on phone: Not on file    Gets together: Not on file    Attends religious service: Not on file    Active member of club or organization: Not on file    Attends meetings of clubs or organizations: Not on file    Relationship status: Not on file  . Intimate partner violence    Fear of current or ex partner: Not on file    Emotionally abused: Not on file    Physically abused: Not on file    Forced sexual activity: Not on file  Other Topics Concern  . Not on file  Social History Narrative   Married- 59 yrs   2 daughters   5 grandchildren   Never Smoked   Alcohol use-yes (occasional/social)   Retired- Engineer, water      Physician roster:      Urologist - Dr. [name]   Orthopedics - Dr. [name]   Dermatology - Dr. [name]    Family History  Problem Relation Age of Onset  . Cancer Mother        colon  . Colon cancer Mother 53  . Pulmonary fibrosis Father        father owned a [employer] (father blamed inhalation of chemicals and fumes)  . Pulmonary fibrosis Brother   . Coronary artery disease Other   . Hyperlipidemia Other   . Colon polyps Neg Hx   . Esophageal cancer Neg Hx   . Rectal cancer Neg Hx   . Stomach cancer Neg Hx     ROS: no fevers or chills, productive cough, hemoptysis, dysphasia, odynophagia, melena,  hematochezia, dysuria, hematuria, rash, seizure activity, orthopnea, PND, pedal edema, claudication. Remaining systems are negative.  Physical Exam: Well-developed well-nourished in no acute distress.  Skin is warm and dry.  HEENT is normal.  Neck is supple.  Chest is clear to auscultation with normal expansion.  Cardiovascular exam is regular rate and rhythm.  Abdominal exam nontender or distended. No masses palpated. Extremities show no edema. neuro grossly intact  ECG-sinus rhythm at a rate of 61, right bundle branch block.  Personally reviewed  A/P  1 coronary artery disease/coronary artery calcification-patient is not having chest pain and previous treadmill negative.  Continue medical therapy with aspirin and statin.  2 hypertension-patient's blood pressure is elevated; increase amlodipine to 10 mg daily and follow.  3 hyperlipidemia-continue statin.  Check lipids; recent LFTs normal.  4 carotid artery disease-patient will need follow-up carotid Dopplers December 2021.  5 thyroid nodule-Per primary care.  [name], MD

## 2019-02-04 ENCOUNTER — Other Ambulatory Visit: Payer: Self-pay

## 2019-02-04 ENCOUNTER — Ambulatory Visit: Payer: Medicare Other | Admitting: Cardiology

## 2019-02-04 ENCOUNTER — Encounter: Payer: Self-pay | Admitting: Cardiology

## 2019-02-04 ENCOUNTER — Ambulatory Visit (HOSPITAL_COMMUNITY)
Admission: RE | Admit: 2019-02-04 | Discharge: 2019-02-04 | Disposition: A | Discharge status: Home / Self Care | Payer: Medicare Other | Source: Ambulatory Visit | Attending: Cardiology | Admitting: Cardiology

## 2019-02-04 VITALS — BP 150/78 | HR 60 | Temp 98.4°F | Ht 72.0 in | Wt 211.4 lb

## 2019-02-04 DIAGNOSIS — E785 Hyperlipidemia, unspecified: Secondary | ICD-10-CM

## 2019-02-04 DIAGNOSIS — I2510 Atherosclerotic heart disease of native coronary artery without angina pectoris: Secondary | ICD-10-CM | POA: Diagnosis not present

## 2019-02-04 DIAGNOSIS — I10 Essential (primary) hypertension: Secondary | ICD-10-CM | POA: Diagnosis not present

## 2019-02-04 DIAGNOSIS — R0989 Other specified symptoms and signs involving the circulatory and respiratory systems: Secondary | ICD-10-CM

## 2019-02-04 LAB — LIPID PANEL
Chol/HDL Ratio: 3.6 ratio (ref 0.0–5.0)
Cholesterol, Total: 161 mg/dL (ref 100–199)
HDL: 45 mg/dL (ref >39)
LDL Chol Calc (NIH): 98 mg/dL (ref 0–99)
Triglycerides: 95 mg/dL (ref 0–149)
VLDL Cholesterol Cal: 18 mg/dL (ref 5–40)

## 2019-02-04 MED ORDER — AMLODIPINE-OLMESARTAN 10-20 MG PO TABS: 1.0000 | ORAL_TABLET | Freq: Every day | ORAL | 3 refills | Status: DC

## 2019-02-04 NOTE — Patient Instructions (Signed)
Medication Instructions:  INCREASE AMLODIPINE-OLMESARTAN TO 10-20 MG ONCE DAILY  *If you need a refill on your cardiac medications before your next appointment, please call your pharmacy*  Lab Work: Your physician recommends that you HAVE LAB WORK TODAY  If you have labs (blood work) drawn today and your tests are completely normal, you will receive your results only by: [name] MyChart Message (if you have MyChart) OR . A paper copy in the mail If you have any lab test that is abnormal or we need to change your treatment, we will call you to review the results.  Follow-Up: At [facility], you and your health needs are our priority.  As part of our continuing mission to provide you with exceptional heart care, we have created designated Provider Care Teams.  These Care Teams include your primary Cardiologist (physician) and Advanced Practice Providers (APPs -  Physician Assistants and Nurse Practitioners) who all work together to provide you with the care you need, when you need it.  Your next appointment:   12 month(s)  The format for your next appointment:   Either In Person or Virtual  Provider:   You may see [name] MD or one of the following Advanced Practice Providers on your designated Care Team:    Kerin Ransom, PA-C  [place], [place]  Coletta Memos, [place]

## 2019-02-05 ENCOUNTER — Telehealth: Payer: Self-pay | Admitting: *Deleted

## 2019-02-05 DIAGNOSIS — E7800 Pure hypercholesterolemia, unspecified: Secondary | ICD-10-CM

## 2019-02-05 MED ORDER — ATORVASTATIN CALCIUM 80 MG PO TABS: 80.0000 mg | ORAL_TABLET | Freq: Every day | ORAL | 3 refills | Status: DC

## 2019-02-05 NOTE — Telephone Encounter (Signed)
New script sent to the pharmacy  Lab orders mailed to the pt  

## 2019-02-05 NOTE — Telephone Encounter (Signed)
-----   Message from Lelon Perla, MD sent at 02/05/2019  7:16 AM EST ----- Change lipitor to 80 mg daily; lipids and liver 12 weeks [name]

## 2019-02-10 DIAGNOSIS — Z7189 Other specified counseling: Secondary | ICD-10-CM | POA: Diagnosis not present

## 2019-02-13 ENCOUNTER — Encounter: Payer: Self-pay | Admitting: *Deleted

## 2019-02-13 ENCOUNTER — Other Ambulatory Visit: Payer: Self-pay | Admitting: *Deleted

## 2019-02-13 DIAGNOSIS — E041 Nontoxic single thyroid nodule: Secondary | ICD-10-CM

## 2019-02-27 ENCOUNTER — Ambulatory Visit
Admission: RE | Admit: 2019-02-27 | Discharge: 2019-02-27 | Disposition: A | Discharge status: Home / Self Care | Payer: Medicare Other | Source: Ambulatory Visit | Attending: Cardiology | Admitting: Cardiology

## 2019-02-27 DIAGNOSIS — E041 Nontoxic single thyroid nodule: Secondary | ICD-10-CM

## 2019-04-01 ENCOUNTER — Ambulatory Visit: Payer: Medicare Other | Admitting: Family Medicine

## 2019-04-15 ENCOUNTER — Ambulatory Visit: Payer: Medicare Other | Attending: Internal Medicine

## 2019-04-15 ENCOUNTER — Ambulatory Visit: Payer: Medicare Other | Admitting: Internal Medicine

## 2019-04-15 ENCOUNTER — Encounter: Payer: Self-pay | Admitting: Internal Medicine

## 2019-04-15 ENCOUNTER — Ambulatory Visit (INDEPENDENT_AMBULATORY_CARE_PROVIDER_SITE_OTHER): Payer: Medicare Other

## 2019-04-15 ENCOUNTER — Other Ambulatory Visit: Payer: Self-pay

## 2019-04-15 DIAGNOSIS — Z23 Encounter for immunization: Secondary | ICD-10-CM | POA: Insufficient documentation

## 2019-04-15 DIAGNOSIS — C61 Malignant neoplasm of prostate: Secondary | ICD-10-CM

## 2019-04-15 DIAGNOSIS — E559 Vitamin D deficiency, unspecified: Secondary | ICD-10-CM | POA: Diagnosis not present

## 2019-04-15 DIAGNOSIS — M159 Polyosteoarthritis, unspecified: Secondary | ICD-10-CM

## 2019-04-15 DIAGNOSIS — M542 Cervicalgia: Secondary | ICD-10-CM

## 2019-04-15 DIAGNOSIS — E041 Nontoxic single thyroid nodule: Secondary | ICD-10-CM

## 2019-04-15 DIAGNOSIS — R7303 Prediabetes: Secondary | ICD-10-CM

## 2019-04-15 DIAGNOSIS — I10 Essential (primary) hypertension: Secondary | ICD-10-CM | POA: Diagnosis not present

## 2019-04-15 DIAGNOSIS — E785 Hyperlipidemia, unspecified: Secondary | ICD-10-CM

## 2019-04-15 NOTE — Assessment & Plan Note (Signed)
Vit D 

## 2019-04-15 NOTE — Assessment & Plan Note (Signed)
A1c

## 2019-04-15 NOTE — Assessment & Plan Note (Addendum)
Pain is worse Vit D X ray neck

## 2019-04-15 NOTE — Assessment & Plan Note (Signed)
F/u w/Dr [name]

## 2019-04-15 NOTE — Assessment & Plan Note (Addendum)
BP Readings from Last 3 Encounters:  04/15/19 (!) 142/80  02/04/19 (!) 150/78  01/10/19 128/72   Azor

## 2019-04-15 NOTE — Assessment & Plan Note (Signed)
X ray

## 2019-04-15 NOTE — Assessment & Plan Note (Signed)
Lipitor - seems to have arthralgias

## 2019-04-15 NOTE — Progress Notes (Addendum)
Subjective:  Patient ID: David [name], male    DOB: [birth year]  Age: 79 y.o. MRN: [id number]  CC: No chief complaint on file.   HPI David [name] presents for dyslipidemia, arthritis, depression follow-up.  She is complaining of a worsening problem with neck pain.  Outpatient Medications Prior to Visit  Medication Sig Dispense Refill  . amlodipine-olmesartan (AZOR) 10-20 MG tablet Take 1 tablet by mouth daily. 90 tablet 3  . aspirin EC 81 MG tablet Take 81 mg by mouth daily.    [name] atorvastatin (LIPITOR) 80 MG tablet Take 1 tablet (80 mg total) by mouth daily at 6 PM. 90 tablet 3  . cetirizine (ZYRTEC) 10 MG tablet Take 1 tablet (10 mg total) by mouth daily. 90 tablet 2  . DULoxetine (CYMBALTA) 30 MG capsule TAKE 1 CAPSULE(30 MG) BY MOUTH DAILY 90 capsule 2  . fluticasone (FLONASE) 50 MCG/ACT nasal spray SHAKE LIQUID AND USE 2 SPRAYS IN EACH NOSTRIL DAILY 48 g 1  . Iodoquinol-HC (HYDROCORTISONE-IODOQUINOL) 1-1 % CREA APP ON THE SKIN BID  2  . pantoprazole (PROTONIX) 40 MG tablet Take 1 tablet (40 mg total) by mouth 2 (two) times daily. 60 tablet 11  . sodium chloride (OCEAN) 0.65 % nasal spray Place 1 spray into the nose daily as needed for congestion.    [name] VITAMIN D, CHOLECALCIFEROL, PO Take 1 tablet by mouth daily.     No facility-administered medications prior to visit.    ROS: Review of Systems  Constitutional: Negative for appetite change, fatigue and unexpected weight change.  HENT: Negative for congestion, nosebleeds, sneezing, sore throat and trouble swallowing.   Eyes: Negative for itching and visual disturbance.  Respiratory: Negative for cough.   Cardiovascular: Negative for chest pain, palpitations and leg swelling.  Gastrointestinal: Negative for abdominal distention, blood in stool, diarrhea and nausea.  Genitourinary: Negative for frequency and hematuria.  Musculoskeletal: Positive for arthralgias, gait problem, neck pain and neck stiffness. Negative for back  pain and joint swelling.  Skin: Negative for rash.  Neurological: Negative for dizziness, tremors, speech difficulty and weakness.  Psychiatric/Behavioral: Negative for agitation, dysphoric mood and sleep disturbance. The patient is not nervous/anxious.     Objective:  BP (!) 142/80 (BP Location: Left Arm, Patient Position: Sitting, Cuff Size: Large)   Pulse 66   Temp 98.5 F (36.9 C) (Oral)   Ht 6' (1.829 m)   Wt 211 lb (95.7 kg)   SpO2 95%   BMI 28.62 kg/m   BP Readings from Last 3 Encounters:  04/15/19 (!) 142/80  02/04/19 (!) 150/78  01/10/19 128/72    Wt Readings from Last 3 Encounters:  04/15/19 211 lb (95.7 kg)  02/04/19 211 lb 6.4 oz (95.9 kg)  01/10/19 214 lb (97.1 kg)    Physical Exam Constitutional:      General: He is not in acute distress.    Appearance: He is well-developed.     Comments: NAD  Eyes:     Conjunctiva/sclera: Conjunctivae normal.     Pupils: Pupils are equal, round, and reactive to light.  Neck:     Thyroid: No thyromegaly.     Vascular: No JVD.  Cardiovascular:     Rate and Rhythm: Normal rate and regular rhythm.     Heart sounds: Normal heart sounds. No murmur. No friction rub. No gallop.   Pulmonary:     Effort: Pulmonary effort is normal. No respiratory distress.     Breath sounds: Normal breath sounds. No  wheezing or rales.  Chest:     Chest wall: No tenderness.  Abdominal:     General: Bowel sounds are normal. There is no distension.     Palpations: Abdomen is soft. There is no mass.     Tenderness: There is no abdominal tenderness. There is no guarding or rebound.  Musculoskeletal:        General: Tenderness present. Normal range of motion.     Cervical back: Normal range of motion.  Lymphadenopathy:     Cervical: No cervical adenopathy.  Skin:    General: Skin is warm and dry.     Findings: No rash.  Neurological:     Mental Status: He is alert and oriented to person, place, and time.     Cranial Nerves: No cranial  nerve deficit.     Motor: No abnormal muscle tone.     Coordination: Coordination normal.     Gait: Gait normal.     Deep Tendon Reflexes: Reflexes are normal and symmetric.  Psychiatric:        Behavior: Behavior normal.        Thought Content: Thought content normal.        Judgment: Judgment normal.   neck tender w/good ROM  Lab Results  Component Value Date   WBC 6.2 04/06/2018   HGB 14.3 04/06/2018   HCT 42.5 04/06/2018   PLT 210.0 04/06/2018   GLUCOSE 97 01/10/2019   CHOL 161 02/04/2019   TRIG 95 02/04/2019   HDL 45 02/04/2019   LDLDIRECT 122.1 03/05/2012   LDLCALC 98 02/04/2019   ALT 30 01/10/2019   AST 25 01/10/2019   NA 141 01/10/2019   K 4.2 01/10/2019   CL 110 01/10/2019   CREATININE 1.04 01/10/2019   BUN 27 (H) 01/10/2019   CO2 24 01/10/2019   TSH 1.24 11/17/2014   PSA 0.047 04/08/2017   INR 1.26 03/03/2013   HGBA1C 6.0 01/10/2019    US THYROID  Result Date: 02/27/2019 CLINICAL DATA:  79 year old male with thyroid nodule EXAM: THYROID ULTRASOUND TECHNIQUE: Ultrasound examination of the thyroid gland and adjacent soft tissues was performed. COMPARISON:  None. FINDINGS: Parenchymal Echotexture: Normal Isthmus: 0.6 cm Right lobe: 5.4 cm x 1.7 cm x 1.5 cm Left lobe: 4.6 cm x 1.6 cm x 1.6 cm _________________________________________________________ Estimated total number of nodules >/= 1 cm: 1 Number of spongiform nodules >/=  2 cm not described below (TR1): 0 Number of mixed cystic and solid nodules >/= 1.5 cm not described below (TR2): 0 _________________________________________________________ Nodule # 1: Location: Right; Mid Maximum size: 0.6 cm; Other 2 dimensions: 0.5 cm x 0.4 cm Composition: cannot determine (2) Echogenicity: very hypoechoic (3) Shape: taller-than-wide (3) Margins: ill-defined (0) Echogenic foci: peripheral calcifications (2) ACR TI-RADS total points: 10. ACR TI-RADS risk category: TR5 (>/= 7 points). ACR TI-RADS recommendations: Nodule meets  criteria for surveillance _________________________________________________________ Nodule # 2: Location: Right; Inferior Maximum size: 1.1 cm; Other 2 dimensions: 1.0 cm x 0.9 cm Composition: solid/almost completely solid (2) Echogenicity: hypoechoic (2) Shape: not taller-than-wide (0) Margins: smooth (0) Echogenic foci: none (0) ACR TI-RADS total points: 4. ACR TI-RADS risk category: TR4 (4-6 points). ACR TI-RADS recommendations: Nodule meets criteria for surveillance _________________________________________________________ No adenopathy IMPRESSION: Right mid thyroid nodule (labeled 1, 0.6 cm, TR 5) and the right inferior thyroid nodule (labeled 2, 1.1 cm, TR 4) both meet criteria for surveillance, as designated by the newly established ACR TI-RADS criteria. Surveillance ultrasound study recommended to be performed annually up  to 5 years. Recommendations follow those established by the new ACR TI-RADS criteria (J Am Coll Radiol N8838707). Electronically Signed   By: Corrie Mckusick D.O.   On: 02/27/2019 15:48    Assessment & Plan:   There are no diagnoses linked to this encounter.   No orders of the defined types were placed in this encounter.    Follow-up: No follow-ups on file.  Walker Kehr, MD

## 2019-04-15 NOTE — Assessment & Plan Note (Signed)
2021 Thyroid US IMPRESSION: Right mid thyroid nodule (labeled 1, 0.6 cm, TR 5) and the right inferior thyroid nodule (labeled 2, 1.1 cm, TR 4) both meet criteria for surveillance, as designated by the newly established ACR TI-RADS criteria. Surveillance ultrasound study recommended to be performed annually up to 5 years.  Recommendations follow those established by the new ACR TI-RADS criteria (J Am Coll Radiol A9880051).

## 2019-04-15 NOTE — Patient Instructions (Signed)
Rice sock heating pain

## 2019-04-15 NOTE — Progress Notes (Signed)
   Covid-19 Vaccination Clinic  Name:  David [name]    MRN: [id number] DOB: Mar 09, 1940  04/15/2019  [name] was observed post Covid-19 immunization for 15 minutes without incidence. He was provided with Vaccine Information Sheet and instruction to access the V-Safe system.   [name] was instructed to call 911 with any severe reactions post vaccine: [name] Difficulty breathing  . Swelling of your face and throat  . A fast heartbeat  . A bad rash all over your body  . Dizziness and weakness    Immunizations Administered    Name Date Dose VIS Date Route   Moderna COVID-19 Vaccine 04/15/2019 11:34 AM 0.5 mL 01/29/2019 Intramuscular   Manufacturer: Moderna   Lot: [id number]   [id number]

## 2019-05-04 ENCOUNTER — Other Ambulatory Visit: Payer: Self-pay | Admitting: Family Medicine

## 2019-05-04 DIAGNOSIS — J309 Allergic rhinitis, unspecified: Secondary | ICD-10-CM

## 2019-05-14 ENCOUNTER — Ambulatory Visit: Payer: Medicare Other | Attending: Internal Medicine

## 2019-05-14 DIAGNOSIS — Z23 Encounter for immunization: Secondary | ICD-10-CM

## 2019-05-14 NOTE — Progress Notes (Signed)
   Covid-19 Vaccination Clinic  Name:  David [name]    MRN: [id number] DOB: 05-21-1940  05/14/2019  [name] was observed post Covid-19 immunization for 15 minutes without incident. He was provided with Vaccine Information Sheet and instruction to access the V-Safe system.   [name] was instructed to call 911 with any severe reactions post vaccine: [name] Difficulty breathing  . Swelling of face and throat  . A fast heartbeat  . A bad rash all over body  . Dizziness and weakness   Immunizations Administered    Name Date Dose VIS Date Route   Moderna COVID-19 Vaccine 05/14/2019 11:10 AM 0.5 mL 01/29/2019 Intramuscular   Manufacturer: Moderna   Lot: [id number]   [id number]

## 2019-05-30 ENCOUNTER — Ambulatory Visit (INDEPENDENT_AMBULATORY_CARE_PROVIDER_SITE_OTHER): Payer: Medicare Other

## 2019-05-30 ENCOUNTER — Other Ambulatory Visit: Payer: Self-pay

## 2019-05-30 ENCOUNTER — Encounter: Payer: Self-pay | Admitting: Internal Medicine

## 2019-05-30 ENCOUNTER — Ambulatory Visit: Payer: Medicare Other | Admitting: Internal Medicine

## 2019-05-30 DIAGNOSIS — R600 Localized edema: Secondary | ICD-10-CM

## 2019-05-30 DIAGNOSIS — R609 Edema, unspecified: Secondary | ICD-10-CM | POA: Insufficient documentation

## 2019-05-30 DIAGNOSIS — R0600 Dyspnea, unspecified: Secondary | ICD-10-CM | POA: Diagnosis not present

## 2019-05-30 DIAGNOSIS — E559 Vitamin D deficiency, unspecified: Secondary | ICD-10-CM | POA: Diagnosis not present

## 2019-05-30 DIAGNOSIS — R0609 Other forms of dyspnea: Secondary | ICD-10-CM

## 2019-05-30 LAB — CBC WITH DIFFERENTIAL/PLATELET
Basophils Absolute: 0.1 10*3/uL (ref 0.0–0.1)
Basophils Relative: 0.9 % (ref 0.0–3.0)
Eosinophils Absolute: 0.2 10*3/uL (ref 0.0–0.7)
Eosinophils Relative: 3.2 % (ref 0.0–5.0)
HCT: 40.5 % (ref 39.0–52.0)
Hemoglobin: 13.6 g/dL (ref 13.0–17.0)
Lymphocytes Relative: 23.7 % (ref 12.0–46.0)
Lymphs Abs: 1.8 10*3/uL (ref 0.7–4.0)
MCHC: 33.6 g/dL (ref 30.0–36.0)
MCV: 93.9 fl (ref 78.0–100.0)
Monocytes Absolute: 0.7 10*3/uL (ref 0.1–1.0)
Monocytes Relative: 9.4 % (ref 3.0–12.0)
Neutro Abs: 4.9 10*3/uL (ref 1.4–7.7)
Neutrophils Relative %: 62.8 % (ref 43.0–77.0)
Platelets: 211 10*3/uL (ref 150.0–400.0)
RBC: 4.32 Mil/uL (ref 4.22–5.81)
RDW: 14.4 % (ref 11.5–15.5)
WBC: 7.8 10*3/uL (ref 4.0–10.5)

## 2019-05-30 LAB — BASIC METABOLIC PANEL
BUN: 25 mg/dL — ABNORMAL HIGH (ref 6–23)
CO2: 26 mEq/L (ref 19–32)
Calcium: 10.2 mg/dL (ref 8.4–10.5)
Chloride: 110 mEq/L (ref 96–112)
Creatinine, Ser: 0.99 mg/dL (ref 0.40–1.50)
GFR: 73.08 mL/min (ref >60.00)
Glucose, Bld: 94 mg/dL (ref 70–99)
Potassium: 4.6 mEq/L (ref 3.5–5.1)
Sodium: 141 mEq/L (ref 135–145)

## 2019-05-30 LAB — BRAIN NATRIURETIC PEPTIDE: Pro B Natriuretic peptide (BNP): 24 pg/mL (ref 0.0–100.0)

## 2019-05-30 LAB — TSH: TSH: 1.42 u[IU]/mL (ref 0.35–4.50)

## 2019-05-30 MED ORDER — FUROSEMIDE 20 MG PO TABS: 20.0000 mg | ORAL_TABLET | Freq: Every day | ORAL | 3 refills | Status: DC

## 2019-05-30 NOTE — Assessment & Plan Note (Signed)
Vit D 

## 2019-05-30 NOTE — Assessment & Plan Note (Signed)
4/21 Take Azor 1/2 tab a day Compression socks - knee high Furosemide

## 2019-05-30 NOTE — Patient Instructions (Signed)
Take Azor 1/2 tab a day Compression socks - knee high

## 2019-05-30 NOTE — Progress Notes (Signed)
Subjective:  Patient ID: David [name], male    DOB: 1940/06/23  Age: 79 y.o. MRN: [id number]  CC: No chief complaint on file.   HPI David [name] presents for B LE swelling R>L x1 wk ago C/o SOB/DOE too  Outpatient Medications Prior to Visit  Medication Sig Dispense Refill  . amlodipine-olmesartan (AZOR) 10-20 MG tablet Take 1 tablet by mouth daily. 90 tablet 3  . aspirin EC 81 MG tablet Take 81 mg by mouth daily.    [name] atorvastatin (LIPITOR) 80 MG tablet Take 1 tablet (80 mg total) by mouth daily at 6 PM. 90 tablet 3  . cetirizine (ZYRTEC) 10 MG tablet Take 1 tablet (10 mg total) by mouth daily. 90 tablet 2  . DULoxetine (CYMBALTA) 30 MG capsule TAKE 1 CAPSULE(30 MG) BY MOUTH DAILY 90 capsule 2  . fluticasone (FLONASE) 50 MCG/ACT nasal spray SHAKE LIQUID AND USE 2 SPRAYS IN EACH NOSTRIL DAILY 48 g 1  . Iodoquinol-HC (HYDROCORTISONE-IODOQUINOL) 1-1 % CREA APP ON THE SKIN BID  2  . pantoprazole (PROTONIX) 40 MG tablet Take 1 tablet (40 mg total) by mouth 2 (two) times daily. 60 tablet 11  . sodium chloride (OCEAN) 0.65 % nasal spray Place 1 spray into the nose daily as needed for congestion.    [name] VITAMIN D, CHOLECALCIFEROL, PO Take 1 tablet by mouth daily.     No facility-administered medications prior to visit.    ROS: Review of Systems  Constitutional: Negative for appetite change, fatigue and unexpected weight change.  HENT: Negative for congestion, nosebleeds, sneezing, sore throat and trouble swallowing.   Eyes: Negative for itching and visual disturbance.  Respiratory: Positive for shortness of breath. Negative for cough and wheezing.   Cardiovascular: Positive for leg swelling. Negative for chest pain and palpitations.  Gastrointestinal: Negative for abdominal distention, blood in stool, diarrhea and nausea.  Genitourinary: Negative for frequency and hematuria.  Musculoskeletal: Negative for back pain, gait problem, joint swelling and neck pain.  Skin: Negative for  rash.  Neurological: Negative for dizziness, tremors, speech difficulty and weakness.  Psychiatric/Behavioral: Negative for agitation, dysphoric mood and sleep disturbance. The patient is not nervous/anxious.     Objective:  BP 132/64 (BP Location: Right Arm, Patient Position: Sitting, Cuff Size: Large)   Pulse 67   Temp 98.3 F (36.8 C) (Oral)   Ht 6' (1.829 m)   Wt 210 lb (95.3 kg)   SpO2 95%   BMI 28.48 kg/m   BP Readings from Last 3 Encounters:  05/30/19 132/64  04/15/19 (!) 142/80  02/04/19 (!) 150/78    Wt Readings from Last 3 Encounters:  05/30/19 210 lb (95.3 kg)  04/15/19 211 lb (95.7 kg)  02/04/19 211 lb 6.4 oz (95.9 kg)    Physical Exam Constitutional:      General: He is not in acute distress.    Appearance: He is well-developed.     Comments: NAD  Eyes:     Conjunctiva/sclera: Conjunctivae normal.     Pupils: Pupils are equal, round, and reactive to light.  Neck:     Thyroid: No thyromegaly.     Vascular: No JVD.  Cardiovascular:     Rate and Rhythm: Normal rate and regular rhythm.     Heart sounds: Normal heart sounds. No murmur. No friction rub. No gallop.   Pulmonary:     Effort: Pulmonary effort is normal. No respiratory distress.     Breath sounds: Normal breath sounds. No wheezing or rales.  Chest:     Chest wall: No tenderness.  Abdominal:     General: Bowel sounds are normal. There is no distension.     Palpations: Abdomen is soft. There is no mass.     Tenderness: There is no abdominal tenderness. There is no guarding or rebound.  Musculoskeletal:        General: No tenderness. Normal range of motion.     Cervical back: Normal range of motion.     Right lower leg: Edema present.     Left lower leg: Edema present.  Lymphadenopathy:     Cervical: No cervical adenopathy.  Skin:    General: Skin is warm and dry.     Findings: No rash.  Neurological:     Mental Status: He is alert and oriented to person, place, and time.     Cranial  Nerves: No cranial nerve deficit.     Motor: No abnormal muscle tone.     Coordination: Coordination normal.     Gait: Gait normal.     Deep Tendon Reflexes: Reflexes are normal and symmetric.  Psychiatric:        Behavior: Behavior normal.        Thought Content: Thought content normal.        Judgment: Judgment normal.    RLE 1+ and LLE trace edema   Lab Results  Component Value Date   WBC 6.2 04/06/2018   HGB 14.3 04/06/2018   HCT 42.5 04/06/2018   PLT 210.0 04/06/2018   GLUCOSE 97 01/10/2019   CHOL 161 02/04/2019   TRIG 95 02/04/2019   HDL 45 02/04/2019   LDLDIRECT 122.1 03/05/2012   LDLCALC 98 02/04/2019   ALT 30 01/10/2019   AST 25 01/10/2019   NA 141 01/10/2019   K 4.2 01/10/2019   CL 110 01/10/2019   CREATININE 1.04 01/10/2019   BUN 27 (H) 01/10/2019   CO2 24 01/10/2019   TSH 1.24 11/17/2014   PSA 0.047 04/08/2017   INR 1.26 03/03/2013   HGBA1C 6.0 01/10/2019    US THYROID  Result Date: 02/27/2019 CLINICAL DATA:  79 year old male with thyroid nodule EXAM: THYROID ULTRASOUND TECHNIQUE: Ultrasound examination of the thyroid gland and adjacent soft tissues was performed. COMPARISON:  None. FINDINGS: Parenchymal Echotexture: Normal Isthmus: 0.6 cm Right lobe: 5.4 cm x 1.7 cm x 1.5 cm Left lobe: 4.6 cm x 1.6 cm x 1.6 cm _________________________________________________________ Estimated total number of nodules >/= 1 cm: 1 Number of spongiform nodules >/=  2 cm not described below (TR1): 0 Number of mixed cystic and solid nodules >/= 1.5 cm not described below (TR2): 0 _________________________________________________________ Nodule # 1: Location: Right; Mid Maximum size: 0.6 cm; Other 2 dimensions: 0.5 cm x 0.4 cm Composition: cannot determine (2) Echogenicity: very hypoechoic (3) Shape: taller-than-wide (3) Margins: ill-defined (0) Echogenic foci: peripheral calcifications (2) ACR TI-RADS total points: 10. ACR TI-RADS risk category: TR5 (>/= 7 points). ACR TI-RADS  recommendations: Nodule meets criteria for surveillance _________________________________________________________ Nodule # 2: Location: Right; Inferior Maximum size: 1.1 cm; Other 2 dimensions: 1.0 cm x 0.9 cm Composition: solid/almost completely solid (2) Echogenicity: hypoechoic (2) Shape: not taller-than-wide (0) Margins: smooth (0) Echogenic foci: none (0) ACR TI-RADS total points: 4. ACR TI-RADS risk category: TR4 (4-6 points). ACR TI-RADS recommendations: Nodule meets criteria for surveillance _________________________________________________________ No adenopathy IMPRESSION: Right mid thyroid nodule (labeled 1, 0.6 cm, TR 5) and the right inferior thyroid nodule (labeled 2, 1.1 cm, TR 4) both meet criteria for surveillance,  as designated by the newly established ACR TI-RADS criteria. Surveillance ultrasound study recommended to be performed annually up to 5 years. Recommendations follow those established by the new ACR TI-RADS criteria (J Am Coll Radiol [id number]). Electronically Signed   By: Corrie Mckusick D.O.   On: 02/27/2019 15:48    Assessment & Plan:   There are no diagnoses linked to this encounter.   No orders of the defined types were placed in this encounter.    Follow-up: No follow-ups on file.  Walker Kehr, MD

## 2019-05-30 NOTE — Assessment & Plan Note (Addendum)
BNP, D dimer, labs, CXR Cardiac ECHO F/u w/Dr Stanford Breed Furosemide

## 2019-05-31 LAB — D-DIMER, QUANTITATIVE: D-Dimer, Quant: 0.58 mcg/mL FEU — ABNORMAL HIGH (ref <0.50)

## 2019-06-06 ENCOUNTER — Other Ambulatory Visit: Payer: Self-pay

## 2019-06-06 ENCOUNTER — Encounter: Payer: Self-pay | Admitting: Internal Medicine

## 2019-06-06 ENCOUNTER — Ambulatory Visit (HOSPITAL_COMMUNITY)
Admission: RE | Admit: 2019-06-06 | Discharge: 2019-06-06 | Disposition: A | Discharge status: Home / Self Care | Payer: Medicare Other | Source: Ambulatory Visit | Attending: Cardiovascular Disease | Admitting: Cardiovascular Disease

## 2019-06-06 ENCOUNTER — Ambulatory Visit: Payer: Medicare Other | Admitting: Internal Medicine

## 2019-06-06 VITALS — BP 144/62 | HR 65 | Temp 97.7°F | Ht 72.0 in | Wt 208.0 lb

## 2019-06-06 DIAGNOSIS — M24574 Contracture, right foot: Secondary | ICD-10-CM | POA: Diagnosis not present

## 2019-06-06 DIAGNOSIS — R600 Localized edema: Secondary | ICD-10-CM | POA: Diagnosis not present

## 2019-06-06 DIAGNOSIS — R0602 Shortness of breath: Secondary | ICD-10-CM

## 2019-06-06 DIAGNOSIS — R0600 Dyspnea, unspecified: Secondary | ICD-10-CM

## 2019-06-06 DIAGNOSIS — R0609 Other forms of dyspnea: Secondary | ICD-10-CM

## 2019-06-06 MED ORDER — TORSEMIDE 20 MG PO TABS: 40.0000 mg | ORAL_TABLET | Freq: Every day | ORAL | 5 refills | Status: DC

## 2019-06-06 NOTE — Patient Instructions (Signed)
Stop Furosemide Start Torsemide

## 2019-06-06 NOTE — Assessment & Plan Note (Signed)
No change R/o PE chest angio CT LE venous doppler US D/c furosemide Start Torsemide

## 2019-06-06 NOTE — Assessment & Plan Note (Signed)
Recent. Bruised. Elevate foot

## 2019-06-06 NOTE — Progress Notes (Signed)
Subjective:  Patient ID: David [name], male    DOB: 1940/07/10  Age: 79 y.o. MRN: [id number]  CC: No chief complaint on file.   HPI David [name] presents for edema and SOB - not better He hit his R foot w/a 4x4 Not using compr socks  Outpatient Medications Prior to Visit  Medication Sig Dispense Refill  . amlodipine-olmesartan (AZOR) 10-20 MG tablet Take 1 tablet by mouth daily. 90 tablet 3  . aspirin EC 81 MG tablet Take 81 mg by mouth daily.    [name] atorvastatin (LIPITOR) 80 MG tablet Take 1 tablet (80 mg total) by mouth daily at 6 PM. 90 tablet 3  . cetirizine (ZYRTEC) 10 MG tablet Take 1 tablet (10 mg total) by mouth daily. 90 tablet 2  . DULoxetine (CYMBALTA) 30 MG capsule TAKE 1 CAPSULE(30 MG) BY MOUTH DAILY 90 capsule 2  . fluticasone (FLONASE) 50 MCG/ACT nasal spray SHAKE LIQUID AND USE 2 SPRAYS IN EACH NOSTRIL DAILY 48 g 1  . furosemide (LASIX) 20 MG tablet Take 1-2 tablets (20-40 mg total) by mouth daily. 60 tablet 3  . Iodoquinol-HC (HYDROCORTISONE-IODOQUINOL) 1-1 % CREA APP ON THE SKIN BID  2  . pantoprazole (PROTONIX) 40 MG tablet Take 1 tablet (40 mg total) by mouth 2 (two) times daily. 60 tablet 11  . sodium chloride (OCEAN) 0.65 % nasal spray Place 1 spray into the nose daily as needed for congestion.    [name] VITAMIN D, CHOLECALCIFEROL, PO Take 1 tablet by mouth daily.     No facility-administered medications prior to visit.    ROS: Review of Systems  Constitutional: Positive for fatigue. Negative for appetite change and unexpected weight change.  HENT: Negative for congestion, nosebleeds, sneezing, sore throat and trouble swallowing.   Eyes: Negative for itching and visual disturbance.  Respiratory: Positive for shortness of breath. Negative for cough.   Cardiovascular: Positive for leg swelling. Negative for chest pain and palpitations.  Gastrointestinal: Negative for abdominal distention, blood in stool, diarrhea and nausea.  Genitourinary: Negative for  frequency and hematuria.  Musculoskeletal: Negative for back pain, gait problem, joint swelling and neck pain.  Skin: Negative for rash.  Neurological: Negative for dizziness, tremors, speech difficulty and weakness.  Psychiatric/Behavioral: Negative for agitation, dysphoric mood and sleep disturbance. The patient is not nervous/anxious.     Objective:  BP (!) 144/62 (BP Location: Left Arm, Patient Position: Sitting, Cuff Size: Large)   Pulse 65   Temp 97.7 F (36.5 C) (Oral)   Ht 6' (1.829 m)   Wt 208 lb (94.3 kg)   SpO2 95%   BMI 28.21 kg/m   BP Readings from Last 3 Encounters:  06/06/19 (!) 144/62  05/30/19 132/64  04/15/19 (!) 142/80    Wt Readings from Last 3 Encounters:  06/06/19 208 lb (94.3 kg)  05/30/19 210 lb (95.3 kg)  04/15/19 211 lb (95.7 kg)    Physical Exam Constitutional:      General: He is not in acute distress.    Appearance: He is well-developed.     Comments: NAD  Eyes:     Conjunctiva/sclera: Conjunctivae normal.     Pupils: Pupils are equal, round, and reactive to light.  Neck:     Thyroid: No thyromegaly.     Vascular: No JVD.  Cardiovascular:     Rate and Rhythm: Normal rate and regular rhythm.     Heart sounds: Normal heart sounds. No murmur. No friction rub. No gallop.   Pulmonary:  Effort: Pulmonary effort is normal. No respiratory distress.     Breath sounds: Normal breath sounds. No wheezing or rales.  Chest:     Chest wall: No tenderness.  Abdominal:     General: Bowel sounds are normal. There is no distension.     Palpations: Abdomen is soft. There is no mass.     Tenderness: There is no abdominal tenderness. There is no guarding or rebound.  Musculoskeletal:        General: No tenderness. Normal range of motion.     Cervical back: Normal range of motion.  Lymphadenopathy:     Cervical: No cervical adenopathy.  Skin:    General: Skin is warm and dry.     Findings: No rash.  Neurological:     Mental Status: He is alert  and oriented to person, place, and time.     Cranial Nerves: No cranial nerve deficit.     Motor: No abnormal muscle tone.     Coordination: Coordination normal.     Gait: Gait normal.     Deep Tendon Reflexes: Reflexes are normal and symmetric.  Psychiatric:        Behavior: Behavior normal.        Thought Content: Thought content normal.        Judgment: Judgment normal.   R foot is bruised Edema 1+ B feet  Lab Results  Component Value Date   WBC 7.8 05/30/2019   HGB 13.6 05/30/2019   HCT 40.5 05/30/2019   PLT 211.0 05/30/2019   GLUCOSE 94 05/30/2019   CHOL 161 02/04/2019   TRIG 95 02/04/2019   HDL 45 02/04/2019   LDLDIRECT 122.1 03/05/2012   LDLCALC 98 02/04/2019   ALT 30 01/10/2019   AST 25 01/10/2019   NA 141 05/30/2019   K 4.6 05/30/2019   CL 110 05/30/2019   CREATININE 0.99 05/30/2019   BUN 25 (H) 05/30/2019   CO2 26 05/30/2019   TSH 1.42 05/30/2019   PSA 0.047 04/08/2017   INR 1.26 03/03/2013   HGBA1C 6.0 01/10/2019    US THYROID  Result Date: 02/27/2019 CLINICAL DATA:  79 year old male with thyroid nodule EXAM: THYROID ULTRASOUND TECHNIQUE: Ultrasound examination of the thyroid gland and adjacent soft tissues was performed. COMPARISON:  None. FINDINGS: Parenchymal Echotexture: Normal Isthmus: 0.6 cm Right lobe: 5.4 cm x 1.7 cm x 1.5 cm Left lobe: 4.6 cm x 1.6 cm x 1.6 cm _________________________________________________________ Estimated total number of nodules >/= 1 cm: 1 Number of spongiform nodules >/=  2 cm not described below (TR1): 0 Number of mixed cystic and solid nodules >/= 1.5 cm not described below (TR2): 0 _________________________________________________________ Nodule # 1: Location: Right; Mid Maximum size: 0.6 cm; Other 2 dimensions: 0.5 cm x 0.4 cm Composition: cannot determine (2) Echogenicity: very hypoechoic (3) Shape: taller-than-wide (3) Margins: ill-defined (0) Echogenic foci: peripheral calcifications (2) ACR TI-RADS total points: 10. ACR  TI-RADS risk category: TR5 (>/= 7 points). ACR TI-RADS recommendations: Nodule meets criteria for surveillance _________________________________________________________ Nodule # 2: Location: Right; Inferior Maximum size: 1.1 cm; Other 2 dimensions: 1.0 cm x 0.9 cm Composition: solid/almost completely solid (2) Echogenicity: hypoechoic (2) Shape: not taller-than-wide (0) Margins: smooth (0) Echogenic foci: none (0) ACR TI-RADS total points: 4. ACR TI-RADS risk category: TR4 (4-6 points). ACR TI-RADS recommendations: Nodule meets criteria for surveillance _________________________________________________________ No adenopathy IMPRESSION: Right mid thyroid nodule (labeled 1, 0.6 cm, TR 5) and the right inferior thyroid nodule (labeled 2, 1.1 cm, TR 4) both  meet criteria for surveillance, as designated by the newly established ACR TI-RADS criteria. Surveillance ultrasound study recommended to be performed annually up to 5 years. Recommendations follow those established by the new ACR TI-RADS criteria (J Am Coll Radiol [id number]). Electronically Signed   By: Corrie Mckusick D.O.   On: 02/27/2019 15:48    Assessment & Plan:   There are no diagnoses linked to this encounter.   No orders of the defined types were placed in this encounter.    Follow-up: No follow-ups on file.  Walker Kehr, MD

## 2019-06-06 NOTE — Assessment & Plan Note (Signed)
Start Torsemide Ven LE doppler US ECHO pending

## 2019-06-07 ENCOUNTER — Ambulatory Visit (INDEPENDENT_AMBULATORY_CARE_PROVIDER_SITE_OTHER)
Admission: RE | Admit: 2019-06-07 | Discharge: 2019-06-07 | Disposition: A | Discharge status: Home / Self Care | Payer: Medicare Other | Source: Ambulatory Visit | Attending: Internal Medicine | Admitting: Internal Medicine

## 2019-06-07 DIAGNOSIS — R600 Localized edema: Secondary | ICD-10-CM

## 2019-06-07 DIAGNOSIS — R0602 Shortness of breath: Secondary | ICD-10-CM | POA: Diagnosis not present

## 2019-06-07 MED ORDER — IOHEXOL 350 MG/ML SOLN
80.0000 mL | Freq: Once | INTRAVENOUS | Status: AC | PRN
  Administered 2019-06-07: 80 mL via INTRAVENOUS

## 2019-06-13 ENCOUNTER — Ambulatory Visit (HOSPITAL_COMMUNITY): Payer: Medicare Other | Attending: Internal Medicine

## 2019-06-13 ENCOUNTER — Other Ambulatory Visit: Payer: Self-pay

## 2019-06-13 DIAGNOSIS — R600 Localized edema: Secondary | ICD-10-CM | POA: Insufficient documentation

## 2019-06-13 DIAGNOSIS — R0600 Dyspnea, unspecified: Secondary | ICD-10-CM | POA: Insufficient documentation

## 2019-06-13 DIAGNOSIS — R0609 Other forms of dyspnea: Secondary | ICD-10-CM

## 2019-06-20 ENCOUNTER — Encounter: Payer: Self-pay | Admitting: Internal Medicine

## 2019-06-20 ENCOUNTER — Other Ambulatory Visit: Payer: Self-pay

## 2019-06-20 ENCOUNTER — Ambulatory Visit: Payer: Medicare Other | Admitting: Internal Medicine

## 2019-06-20 VITALS — BP 112/54 | HR 64 | Temp 97.9°F | Ht 72.0 in | Wt 204.0 lb

## 2019-06-20 DIAGNOSIS — R0600 Dyspnea, unspecified: Secondary | ICD-10-CM

## 2019-06-20 DIAGNOSIS — R109 Unspecified abdominal pain: Secondary | ICD-10-CM

## 2019-06-20 DIAGNOSIS — E559 Vitamin D deficiency, unspecified: Secondary | ICD-10-CM

## 2019-06-20 DIAGNOSIS — R600 Localized edema: Secondary | ICD-10-CM

## 2019-06-20 DIAGNOSIS — R202 Paresthesia of skin: Secondary | ICD-10-CM

## 2019-06-20 DIAGNOSIS — R0609 Other forms of dyspnea: Secondary | ICD-10-CM

## 2019-06-20 DIAGNOSIS — R0602 Shortness of breath: Secondary | ICD-10-CM | POA: Diagnosis not present

## 2019-06-20 DIAGNOSIS — C61 Malignant neoplasm of prostate: Secondary | ICD-10-CM

## 2019-06-20 LAB — SEDIMENTATION RATE: Sed Rate: 35 mm/hr — ABNORMAL HIGH (ref 0–20)

## 2019-06-20 LAB — URINALYSIS
Bilirubin Urine: NEGATIVE
Hgb urine dipstick: NEGATIVE
Ketones, ur: NEGATIVE
Leukocytes,Ua: NEGATIVE
Nitrite: NEGATIVE
Specific Gravity, Urine: 1.02 (ref 1.000–1.030)
Total Protein, Urine: NEGATIVE
Urine Glucose: NEGATIVE
Urobilinogen, UA: 0.2 (ref 0.0–1.0)
pH: 5 (ref 5.0–8.0)

## 2019-06-20 LAB — HEPATIC FUNCTION PANEL
ALT: 30 U/L (ref 0–53)
AST: 27 U/L (ref 0–37)
Albumin: 4.5 g/dL (ref 3.5–5.2)
Alkaline Phosphatase: 143 U/L — ABNORMAL HIGH (ref 39–117)
Bilirubin, Direct: 0.1 mg/dL (ref 0.0–0.3)
Total Bilirubin: 0.6 mg/dL (ref 0.2–1.2)
Total Protein: 7.7 g/dL (ref 6.0–8.3)

## 2019-06-20 LAB — VITAMIN D 25 HYDROXY (VIT D DEFICIENCY, FRACTURES): VITD: 48.46 ng/mL (ref 30.00–100.00)

## 2019-06-20 LAB — BASIC METABOLIC PANEL
BUN: 45 mg/dL — ABNORMAL HIGH (ref 6–23)
CO2: 26 mEq/L (ref 19–32)
Calcium: 10.3 mg/dL (ref 8.4–10.5)
Chloride: 104 mEq/L (ref 96–112)
Creatinine, Ser: 1.64 mg/dL — ABNORMAL HIGH (ref 0.40–1.50)
GFR: 40.81 mL/min — ABNORMAL LOW (ref >60.00)
Glucose, Bld: 91 mg/dL (ref 70–99)
Potassium: 4.2 mEq/L (ref 3.5–5.1)
Sodium: 140 mEq/L (ref 135–145)

## 2019-06-20 LAB — CORTISOL: Cortisol, Plasma: 10.1 ug/dL

## 2019-06-20 LAB — VITAMIN B12: Vitamin B-12: 780 pg/mL (ref 211–911)

## 2019-06-20 NOTE — Progress Notes (Signed)
Subjective:  Patient ID: David [name], male    DOB: October 01, 1940  Age: 79 y.o. MRN: [id number]  CC: No chief complaint on file.   HPI David [name] presents for SOB/DOE - same, swelling - better, fatigue - same No CP, HA, abd pain No chills, fever  Outpatient Medications Prior to Visit  Medication Sig Dispense Refill  . amlodipine-olmesartan (AZOR) 10-20 MG tablet Take 1 tablet by mouth daily. (Patient taking differently: Take 0.5 tablets by mouth daily. ) 90 tablet 3  . aspirin EC 81 MG tablet Take 81 mg by mouth daily.    [name] atorvastatin (LIPITOR) 80 MG tablet Take 1 tablet (80 mg total) by mouth daily at 6 PM. 90 tablet 3  . cetirizine (ZYRTEC) 10 MG tablet Take 1 tablet (10 mg total) by mouth daily. 90 tablet 2  . DULoxetine (CYMBALTA) 30 MG capsule TAKE 1 CAPSULE(30 MG) BY MOUTH DAILY 90 capsule 2  . fluticasone (FLONASE) 50 MCG/ACT nasal spray SHAKE LIQUID AND USE 2 SPRAYS IN EACH NOSTRIL DAILY 48 g 1  . Iodoquinol-HC (HYDROCORTISONE-IODOQUINOL) 1-1 % CREA APP ON THE SKIN BID  2  . pantoprazole (PROTONIX) 40 MG tablet Take 1 tablet (40 mg total) by mouth 2 (two) times daily. 60 tablet 11  . sodium chloride (OCEAN) 0.65 % nasal spray Place 1 spray into the nose daily as needed for congestion.    . torsemide (DEMADEX) 20 MG tablet Take 2 tablets (40 mg total) by mouth daily. 60 tablet 5  . VITAMIN D, CHOLECALCIFEROL, PO Take 1 tablet by mouth daily.     No facility-administered medications prior to visit.    ROS: Review of Systems  Constitutional: Positive for fatigue and unexpected weight change. Negative for appetite change.  HENT: Negative for congestion, nosebleeds, sneezing, sore throat and trouble swallowing.   Eyes: Negative for itching and visual disturbance.  Respiratory: Positive for shortness of breath. Negative for cough.   Cardiovascular: Negative for chest pain, palpitations and leg swelling.  Gastrointestinal: Negative for abdominal distention, blood in  stool, diarrhea and nausea.  Genitourinary: Negative for frequency and hematuria.  Musculoskeletal: Negative for back pain, gait problem, joint swelling and neck pain.  Skin: Negative for rash.  Neurological: Positive for weakness. Negative for dizziness, tremors and speech difficulty.  Psychiatric/Behavioral: Negative for agitation, dysphoric mood, sleep disturbance and suicidal ideas. The patient is not nervous/anxious.     Objective:  BP (!) 112/54 (BP Location: Left Arm, Patient Position: Sitting, Cuff Size: Large)   Pulse 64   Temp 97.9 F (36.6 C) (Oral)   Ht 6' (1.829 m)   Wt 204 lb (92.5 kg)   SpO2 97%   BMI 27.67 kg/m   BP Readings from Last 3 Encounters:  06/20/19 (!) 112/54  06/06/19 (!) 144/62  05/30/19 132/64    Wt Readings from Last 3 Encounters:  06/20/19 204 lb (92.5 kg)  06/06/19 208 lb (94.3 kg)  05/30/19 210 lb (95.3 kg)    Physical Exam Constitutional:      General: He is not in acute distress.    Appearance: He is well-developed. He is obese.     Comments: NAD  Eyes:     Conjunctiva/sclera: Conjunctivae normal.     Pupils: Pupils are equal, round, and reactive to light.  Neck:     Thyroid: No thyromegaly.     Vascular: No JVD.  Cardiovascular:     Rate and Rhythm: Normal rate and regular rhythm.  Heart sounds: Normal heart sounds. No murmur. No friction rub. No gallop.   Pulmonary:     Effort: Pulmonary effort is normal. No respiratory distress.     Breath sounds: Normal breath sounds. No wheezing or rales.  Chest:     Chest wall: No tenderness.  Abdominal:     General: Bowel sounds are normal. There is no distension.     Palpations: Abdomen is soft. There is no mass.     Tenderness: There is no abdominal tenderness. There is no guarding or rebound.  Musculoskeletal:        General: No tenderness. Normal range of motion.     Cervical back: Normal range of motion.  Lymphadenopathy:     Cervical: No cervical adenopathy.  Skin:     General: Skin is warm and dry.     Findings: No rash.  Neurological:     Mental Status: He is alert and oriented to person, place, and time.     Cranial Nerves: No cranial nerve deficit.     Motor: No abnormal muscle tone.     Coordination: Coordination normal.     Gait: Gait normal.     Deep Tendon Reflexes: Reflexes are normal and symmetric.  Psychiatric:        Behavior: Behavior normal.        Thought Content: Thought content normal.        Judgment: Judgment normal.    ECHO, labs - ok  Lab Results  Component Value Date   WBC 7.8 05/30/2019   HGB 13.6 05/30/2019   HCT 40.5 05/30/2019   PLT 211.0 05/30/2019   GLUCOSE 94 05/30/2019   CHOL 161 02/04/2019   TRIG 95 02/04/2019   HDL 45 02/04/2019   LDLDIRECT 122.1 03/05/2012   LDLCALC 98 02/04/2019   ALT 30 01/10/2019   AST 25 01/10/2019   NA 141 05/30/2019   K 4.6 05/30/2019   CL 110 05/30/2019   CREATININE 0.99 05/30/2019   BUN 25 (H) 05/30/2019   CO2 26 05/30/2019   TSH 1.42 05/30/2019   PSA 0.047 04/08/2017   INR 1.26 03/03/2013   HGBA1C 6.0 01/10/2019    CT Angio Chest W/Cm &/Or Wo Cm  Result Date: 06/07/2019 CLINICAL DATA:  Shortness of breath with elevated D-dimer levels. Clinical concern for pulmonary embolism. History of prostate cancer and kidney tumor removal. EXAM: CT ANGIOGRAPHY CHEST WITH CONTRAST TECHNIQUE: Multidetector CT imaging of the chest was performed using the standard protocol during bolus administration of intravenous contrast. Multiplanar CT image reconstructions and MIPs were obtained to evaluate the vascular anatomy. CONTRAST:  31mL OMNIPAQUE IOHEXOL 350 MG/ML SOLN COMPARISON:  Chest CT 06/13/2016. FINDINGS: Cardiovascular: The pulmonary arteries are well opacified with contrast to the level of the subsegmental branches. There is no evidence of acute pulmonary embolism. Atherosclerosis of the aorta, great vessels and coronary arteries. No evidence of acute systemic arterial abnormality. Probable  calcifications of the aortic valve. The heart size is normal. There is no pericardial effusion. Mediastinum/Nodes: There are no enlarged mediastinal, hilar or axillary lymph nodes.2.7 cm right paratracheal nodule at the thoracic inlet (image 12/4) is unchanged. This abuts the esophagus and may reflect an esophageal diverticulum. There is a moderate-sized hiatal hernia which has enlarged compared with the previous study. No significant abnormality of the trachea or thyroid gland. Lungs/Pleura: No pleural effusion or pneumothorax. Stable 5 mm right lower lobe nodule on image 45/6, consistent with a benign finding. The lungs are otherwise clear. Upper abdomen:  No acute findings are seen within the visualized upper abdomen. There is a nonobstructing calculus and cyst in the upper pole of the left kidney. Mild reflux of contrast into the IVC. Musculoskeletal/Chest wall: There is no chest wall mass or suspicious osseous finding. Flowing syndesmophytes throughout the thoracic spine, favoring diffuse idiopathic skeletal hyperostosis based on previous imaging of the lumbar spine and sacroiliac joints. Review of the MIP images confirms the above findings. IMPRESSION: 1. No evidence of acute pulmonary embolism or other acute chest process. 2. Enlarging hiatal hernia. 3. Stable incidental findings including a probable proximal esophageal diverticulum, nonobstructing left renal calculus and cyst. 4. Aortic Atherosclerosis (ICD10-I70.0). Electronically Signed   By: Richardean Sale M.D.   On: 06/07/2019 13:50    Assessment & Plan:    Walker Kehr, MD

## 2019-06-20 NOTE — Assessment & Plan Note (Signed)
Last check up in Feb 2021 -- PSA 0

## 2019-06-20 NOTE — Patient Instructions (Signed)
Use Torsemide as needed

## 2019-06-20 NOTE — Addendum Note (Signed)
Addended by: Cresenciano Lick on: 06/20/2019 10:54 AM   Modules accepted: Orders

## 2019-06-20 NOTE — Assessment & Plan Note (Signed)
Not better Cardiol ref - dr Stanford Breed

## 2019-06-20 NOTE — Assessment & Plan Note (Signed)
Better Use Torsemide prn

## 2019-06-26 ENCOUNTER — Encounter: Payer: Self-pay | Admitting: Internal Medicine

## 2019-06-27 ENCOUNTER — Ambulatory Visit
Admission: RE | Admit: 2019-06-27 | Discharge: 2019-06-27 | Disposition: A | Discharge status: Home / Self Care | Payer: Medicare Other | Source: Ambulatory Visit | Attending: Internal Medicine | Admitting: Internal Medicine

## 2019-06-27 NOTE — Addendum Note (Signed)
Addended by: Karren Cobble on: 06/27/2019 08:40 AM   Modules accepted: Orders

## 2019-06-30 NOTE — Progress Notes (Signed)
HPI: FU dyspnea. Abdominal CT December 2015 showed no aneurysm but there was note of coronary calcification. Chest CT April 2018 showed a 5 mm right lower lobe pulmonary nodule unchanged and no acute cardiopulmonary disease.Exercise treadmill May 2018 negative.Carotid Dopplers December 2020 showed 40 to 59% right and 1 to 39% left stenosis. There was note of a right thyroid nodule. Follow-up thyroid ultrasound December 2020 showed right mid thyroid nodule and right inferior thyroid nodule and follow-up recommended in 1 year. Lower extremity venous Dopplers April 2021 showed no DVT.  Echocardiogram April 2021 showed normal LV systolic function, grade 1 diastolic dysfunction, mild left atrial enlargement and mildly dilated aortic root measuring 42 to 43 mm. CTA April 2021 showed no pulmonary embolus; atherosclerosis of great vessels, aorta and coronary arteries noted. Abdominal ultrasound April 2021 unrevealing.  Laboratories April 2021 showed BNP 24, TSH 1.42, BUN 25 and creatinine 0.99, hemoglobin 13.6, normal urinalysis, and normal liver functions with exception of mildly elevated alkaline phosphatase 143.  Since last seen  over the past 6 weeks he has developed progressive fatigue, dyspnea on exertion and bilateral lower extremity edema.  There is no orthopnea, PND, chest pain or syncope.  Current Outpatient Medications  Medication Sig Dispense Refill  . amlodipine-olmesartan (AZOR) 10-20 MG tablet Take 1 tablet by mouth daily. Take half a tablet daily    . aspirin EC 81 MG tablet Take 81 mg by mouth daily.    [name] atorvastatin (LIPITOR) 80 MG tablet Take 1 tablet (80 mg total) by mouth daily at 6 PM. 90 tablet 3  . cetirizine (ZYRTEC) 10 MG tablet Take 1 tablet (10 mg total) by mouth daily. 90 tablet 2  . DULoxetine (CYMBALTA) 30 MG capsule TAKE 1 CAPSULE(30 MG) BY MOUTH DAILY 90 capsule 2  . fluticasone (FLONASE) 50 MCG/ACT nasal spray SHAKE LIQUID AND USE 2 SPRAYS IN EACH NOSTRIL DAILY 48 g 1    . Iodoquinol-HC (HYDROCORTISONE-IODOQUINOL) 1-1 % CREA APP ON THE SKIN BID  2  . pantoprazole (PROTONIX) 40 MG tablet Take 1 tablet (40 mg total) by mouth 2 (two) times daily. 60 tablet 11  . sodium chloride (OCEAN) 0.65 % nasal spray Place 1 spray into the nose daily as needed for congestion.    . torsemide (DEMADEX) 20 MG tablet Take 2 tablets (40 mg total) by mouth daily. 60 tablet 5  . VITAMIN D, CHOLECALCIFEROL, PO Take 1 tablet by mouth daily.     No current facility-administered medications for this visit.     Past Medical History:  Diagnosis Date  . Allergy   . Arthritis    gen.  and left hip  . BPH (benign prostatic hypertrophy)   . GERD (gastroesophageal reflux disease)    occasional  . History of kidney stones    2007  . Hyperlipidemia   . Hypertension   . Kidney tumor   . LBP (low back pain) 06/2005   L5 radicular symptoms; MRI of LS spine severe spondylosis at L4-L5 with central cancal stenosis   . Nocturia   . Organic impotence   . Prostate cancer ([place]) 10/2011   (Low grade) [facility] - Dr. Junious Silk  . Pyelonephritis 03/05/2013  . Renal oncocytoma of right kidney 01/23/2013   As 3.2 x 3.5 cm enhancing exophytic mass projecting off the lower pole. This is consistent with a renal cell carcinoma. No retroperitoneal lymphadenopathy.   [name] Spermatocele   . 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 Wears glasses  Past Surgical History:  Procedure Laterality Date  . COLONOSCOPY  02/15/2006, 2014   Internal hemorrhoids (Dr [name]), last in 2014  . INGUINAL HERNIA REPAIR Right 1980  . LYMPHADENECTOMY Bilateral 02/15/2013   Procedure: LYMPHADENECTOMY WITH INDOCYANINE GREEN DYE INJECTION;  Surgeon: [name], MD;  Location: [facility];  Service: Urology;  Laterality: Bilateral;  . PROSTATE BIOPSY N/A 12/18/2012   Procedure: BIOPSY TRANSRECTAL ULTRASONIC PROSTATE (TUBP);  Surgeon: [name], MD;  Location[facility];  Service: Urology;  Laterality:  N/A;  . PROSTATE BIOPSY  12/05/11   gleason 6, 3/12 cores  . REPAIR UMBILICAL AND VENTRAL HERNIA'S W/ MESH  08-21-2009  . ROBOT ASSISTED LAPAROSCOPIC RADICAL PROSTATECTOMY N/A 02/15/2013   Procedure: ROBOTIC ASSISTED LAPAROSCOPIC RADICAL PROSTATECTOMY;  Surgeon: [name], MD;  Location: [facility];  Service: Urology;  Laterality: N/A;  . ROBOTIC ASSITED PARTIAL NEPHRECTOMY Right 02/15/2013   Procedure: ROBOTIC ASSITED PARTIAL NEPHRECTOMY;  Surgeon: [name], MD;  Location: [facility];  Service: Urology;  Laterality: Right;  . SPERMATOCELECTOMY Left 12/18/2012   Procedure: LEFT SPERMATOCELECTOMY;  Surgeon: [name], MD;  Location: [facility];  Service: Urology;  Laterality: Left;  . TONSILLECTOMY AND ADENOIDECTOMY  as child  . TRANSURETHRAL RESECTION OF PROSTATE  AGE 81    Social History   Socioeconomic History  . Marital status: Married    Spouse name: Not on file  . Number of children: 2  . Years of education: Not on file  . Highest education level: Not on file  Occupational History  . Not on file  Tobacco Use  . Smoking status: Former Smoker    Packs/day: 0.25    Years: 10.00    Pack years: 2.50    Types: Cigarettes    Quit date: 02/28/1981    Years since quitting: 38.3  . Smokeless tobacco: Never Used  Substance and Sexual Activity  . Alcohol use: Yes    Alcohol/week: 1.0 standard drinks    Types: 1 Glasses of wine per week  . Drug use: Not Currently    Types: Flunitrazepam  . Sexual activity: Not on file  Other Topics Concern  . Not on file  Social History Narrative   Married- 50 yrs   2 daughters   5 grandchildren   Never Smoked   Alcohol use-yes (occasional/social)   Retired- Engineer, water      Physician roster:      Urologist - Dr. [name]   Orthopedics - Dr. [name]   Dermatology - Dr. [name]   Social Determinants of Health   Financial Resource Strain:   . Difficulty of Paying Living Expenses:   Food Insecurity:     . Worried About Charity fundraiser in the Last Year:   . Arboriculturist in the Last Year:   Transportation Needs:   . Film/video editor (Medical):   [name] Lack of Transportation (Non-Medical):   Physical Activity:   . Days of Exercise per Week:   . Minutes of Exercise per Session:   Stress:   . Feeling of Stress :   Social Connections:   . Frequency of Communication with Friends and Family:   . Frequency of Social Gatherings with Friends and Family:   . Attends Religious Services:   . Active Member of Clubs or Organizations:   . Attends Archivist Meetings:   [name] Marital Status:   Intimate Partner Violence:   . Fear of Current or Ex-Partner:   .  Emotionally Abused:   [name] Physically Abused:   . Sexually Abused:     Family History  Problem Relation Age of Onset  . Cancer Mother        colon  . Colon cancer Mother 32  . Pulmonary fibrosis Father        father owned a Radio broadcast assistant (father blamed inhalation of chemicals and fumes)  . Pulmonary fibrosis Brother   . Coronary artery disease Other   . Hyperlipidemia Other   . Colon polyps Neg Hx   . Esophageal cancer Neg Hx   . Rectal cancer Neg Hx   . Stomach cancer Neg Hx     ROS: Fatigue but no fevers or chills, productive cough, hemoptysis, dysphasia, odynophagia, melena, hematochezia, dysuria, hematuria, rash, seizure activity, orthopnea, PND, claudication. Remaining systems are negative.  Physical Exam: Well-developed well-nourished in no acute distress.  Skin is warm and dry.  HEENT is normal.  Neck is supple.  Chest is clear to auscultation with normal expansion.  Cardiovascular exam is regular rate and rhythm.  Abdominal exam nontender or distended. No masses palpated. Extremities show trace edema. neuro grossly intact  ECG-normal sinus rhythm at a rate of 60, IVCD.  Personally reviewed  A/P  1 dyspnea/lower extremity edema-etiology unclear.  Echocardiogram shows normal LV function, BNP normal,  lower extremity venous Dopplers normal, no pulmonary embolus on CTA.  Albumin is normal.  His urinalysis does not show proteinuria and therefore nephrotic syndrome seems unlikely.  He is now on Demadex and his renal function is worse based on follow-up blood work April 22.  We will recheck potassium, renal function, BNP and liver functions.  May need to consider further evaluation for amyloid though I would have expected his BNP to be elevated.  May also need to consider right and left cardiac catheterization to evaluate pulmonary pressures to exclude significant coronary disease.  2 coronary artery disease/coronary artery calcification-plan to continue aspirin and statin.  3 hypertension-patient's blood pressure is controlled.  I considered whether amlodipine would be contributing to his lower extremity edema.  However his dose was recently reduced to what he was on for the past 15 years and he did not have edema at that time.  It would also not explain his dyspnea.  We will continue with Azor for now.  4 hyperlipidemia-continue statin.  5 carotid artery disease-patient needs follow-up carotid Dopplers December 2021.  6 thyroid nodule-follow-up primary care.  [name], MD

## 2019-07-01 ENCOUNTER — Encounter: Payer: Self-pay | Admitting: Cardiology

## 2019-07-01 ENCOUNTER — Other Ambulatory Visit: Payer: Self-pay

## 2019-07-01 ENCOUNTER — Ambulatory Visit (INDEPENDENT_AMBULATORY_CARE_PROVIDER_SITE_OTHER): Payer: Medicare Other | Admitting: Cardiology

## 2019-07-01 VITALS — BP 122/68 | HR 60 | Temp 98.1°F | Ht 72.0 in | Wt 209.8 lb

## 2019-07-01 DIAGNOSIS — R0602 Shortness of breath: Secondary | ICD-10-CM

## 2019-07-01 DIAGNOSIS — I10 Essential (primary) hypertension: Secondary | ICD-10-CM

## 2019-07-01 DIAGNOSIS — E7800 Pure hypercholesterolemia, unspecified: Secondary | ICD-10-CM | POA: Diagnosis not present

## 2019-07-01 DIAGNOSIS — I2510 Atherosclerotic heart disease of native coronary artery without angina pectoris: Secondary | ICD-10-CM

## 2019-07-01 DIAGNOSIS — R600 Localized edema: Secondary | ICD-10-CM | POA: Diagnosis not present

## 2019-07-01 NOTE — Patient Instructions (Signed)
Medication Instructions:  NO CHANGE *If you need a refill on your cardiac medications before your next appointment, please call your pharmacy*   Lab Work: Your physician recommends that you HAVE LAB WORK TODAY  If you have labs (blood work) drawn today and your tests are completely normal, you will receive your results only by: [name] MyChart Message (if you have MyChart) OR . A paper copy in the mail If you have any lab test that is abnormal or we need to change your treatment, we will call you to review the results.   Follow-Up: At [facility], you and your health needs are our priority.  As part of our continuing mission to provide you with exceptional heart care, we have created designated Provider Care Teams.  These Care Teams include your primary Cardiologist (physician) and Advanced Practice Providers (APPs -  Physician Assistants and Nurse Practitioners) who all work together to provide you with the care you need, when you need it.  We recommend signing up for the patient portal called "MyChart".  Sign up information is provided on this After Visit Summary.  MyChart is used to connect with patients for Virtual Visits (Telemedicine).  Patients are able to view lab/test results, encounter notes, upcoming appointments, etc.  Non-urgent messages can be sent to your provider as well.   To learn more about what you can do with MyChart, go to [email or web address].    Your next appointment:   2-4  week(s)  The format for your next appointment:   In Person  Provider:   [name], MD

## 2019-07-02 ENCOUNTER — Telehealth: Payer: Self-pay | Admitting: *Deleted

## 2019-07-02 ENCOUNTER — Other Ambulatory Visit: Payer: Self-pay | Admitting: *Deleted

## 2019-07-02 DIAGNOSIS — R0602 Shortness of breath: Secondary | ICD-10-CM

## 2019-07-02 DIAGNOSIS — R748 Abnormal levels of other serum enzymes: Secondary | ICD-10-CM

## 2019-07-02 LAB — BASIC METABOLIC PANEL
BUN/Creatinine Ratio: 22 (ref 10–24)
BUN: 20 mg/dL (ref 8–27)
CO2: 20 mmol/L (ref 20–29)
Calcium: 10 mg/dL (ref 8.6–10.2)
Chloride: 112 mmol/L — ABNORMAL HIGH (ref 96–106)
Creatinine, Ser: 0.93 mg/dL (ref 0.76–1.27)
GFR calc Af Amer: 91 mL/min/{1.73_m2} (ref >59)
GFR calc non Af Amer: 78 mL/min/{1.73_m2} (ref >59)
Glucose: 135 mg/dL — ABNORMAL HIGH (ref 65–99)
Potassium: 4.3 mmol/L (ref 3.5–5.2)
Sodium: 144 mmol/L (ref 134–144)

## 2019-07-02 LAB — HEPATIC FUNCTION PANEL
ALT: 30 IU/L (ref 0–44)
AST: 25 IU/L (ref 0–40)
Albumin: 4 g/dL (ref 3.7–4.7)
Alkaline Phosphatase: 158 IU/L — ABNORMAL HIGH (ref 39–117)
Bilirubin Total: 0.3 mg/dL (ref 0.0–1.2)
Bilirubin, Direct: 0.1 mg/dL (ref 0.00–0.40)
Total Protein: 6.3 g/dL (ref 6.0–8.5)

## 2019-07-02 LAB — PRO B NATRIURETIC PEPTIDE: NT-Pro BNP: 96 pg/mL (ref 0–486)

## 2019-07-02 NOTE — Telephone Encounter (Addendum)
-----   Message from Lelon Perla, MD sent at 07/02/2019  7:20 AM EDT ----- No change in meds; renal function improved compared to previous; schedule PFTs with DLCO; schedule ggt and 5' nucleotidase. [name]  Spoke with pt, Aware of dr Jacalyn Lefevre recommendations. Lab orders mailed to the pt and order placed for PFT with DLCO

## 2019-07-04 ENCOUNTER — Ambulatory Visit: Payer: Medicare Other | Admitting: Cardiology

## 2019-07-09 ENCOUNTER — Telehealth: Payer: Self-pay | Admitting: *Deleted

## 2019-07-09 NOTE — Telephone Encounter (Signed)
PFT scheduled for 07/25/19 @ 2 pm at [facility] [place]. The patient will need to arrive 15 min early. He is scheduled for covid testing 07-23-19 @ 10:15 am. There is no smoking, using of inhalers or caffeine 4 hours prior to PFT's.

## 2019-07-09 NOTE — Telephone Encounter (Signed)
-----   Message from Lelon Perla, MD sent at 07/02/2019  7:20 AM EDT ----- No change in meds; renal function improved compared to previous; schedule PFTs with DLCO; schedule ggt and 5' nucleotidase. [name]

## 2019-07-10 LAB — GAMMA GT: GGT: 29 IU/L (ref 0–65)

## 2019-07-10 LAB — NUCLEOTIDASE, 5', BLOOD: 5-Nucleotidase: 3 IU/L (ref 0–10)

## 2019-07-10 NOTE — Telephone Encounter (Signed)
Patient walked into the office, he was unable to get through on the phone. Instructions and directions discussed in detail.

## 2019-07-15 ENCOUNTER — Other Ambulatory Visit: Payer: Self-pay | Admitting: Internal Medicine

## 2019-07-15 DIAGNOSIS — M159 Polyosteoarthritis, unspecified: Secondary | ICD-10-CM

## 2019-07-15 NOTE — Progress Notes (Signed)
HPI: FU dyspnea. Abdominal CT December 2015 showed no aneurysm but there was note of coronary calcification. Chest CT April 2018 showed a 5 mm right lower lobe pulmonary nodule unchanged and no acute cardiopulmonary disease.Exercise treadmill May 2018 negative.Carotid Dopplers December 2020 showed 40 to 59% right and 1 to 39% left stenosis. There was note of a right thyroid nodule. Follow-up thyroid ultrasound December 2020 showed right mid thyroid nodule and right inferior thyroid nodule and follow-up recommended in 1 year.Lower extremity venous Dopplers April 2021 showed no DVT.  Echocardiogram April 2021 showed normal LV systolic function, grade 1 diastolic dysfunction, mild left atrial enlargement and mildly dilated aortic root measuring 42 to 43 mm. CTA April 2021 showed no pulmonary embolus; atherosclerosis of great vessels, aorta and coronary arteries noted. Abdominal ultrasound April 2021 unrevealing.  Laboratories April 2021 showed BNP 24, TSH 1.42, BUN 25 and creatinine 0.99, hemoglobin 13.6, normal urinalysis, and normal liver functions with exception of mildly elevated alkaline phosphatase 143.  Since last seenhe continues to have dyspnea on exertion.  No orthopnea, PND, chest pain or syncope.  Minimal pedal edema.  He is taking his diuretic approximately 2 out of 3 days.  Current Outpatient Medications  Medication Sig Dispense Refill  . amlodipine-olmesartan (AZOR) 10-20 MG tablet Take 1 tablet by mouth daily. Take half a tablet daily    . aspirin EC 81 MG tablet Take 81 mg by mouth daily.    [name] atorvastatin (LIPITOR) 80 MG tablet Take 1 tablet (80 mg total) by mouth daily at 6 PM. 90 tablet 3  . cetirizine (ZYRTEC) 10 MG tablet Take 1 tablet (10 mg total) by mouth daily. 90 tablet 2  . DULoxetine (CYMBALTA) 30 MG capsule TAKE 1 CAPSULE(30 MG) BY MOUTH DAILY 90 capsule 2  . fluticasone (FLONASE) 50 MCG/ACT nasal spray SHAKE LIQUID AND USE 2 SPRAYS IN EACH NOSTRIL DAILY 48 g 1  .  Iodoquinol-HC (HYDROCORTISONE-IODOQUINOL) 1-1 % CREA APP ON THE SKIN BID  2  . pantoprazole (PROTONIX) 40 MG tablet Take 1 tablet (40 mg total) by mouth 2 (two) times daily. 60 tablet 11  . sodium chloride (OCEAN) 0.65 % nasal spray Place 1 spray into the nose daily as needed for congestion.    . torsemide (DEMADEX) 20 MG tablet Take 2 tablets (40 mg total) by mouth daily. 60 tablet 5  . VITAMIN D, CHOLECALCIFEROL, PO Take 1 tablet by mouth daily.     No current facility-administered medications for this visit.     Past Medical History:  Diagnosis Date  . Allergy   . Arthritis    gen.  and left hip  . BPH (benign prostatic hypertrophy)   . GERD (gastroesophageal reflux disease)    occasional  . History of kidney stones    2007  . Hyperlipidemia   . Hypertension   . Kidney tumor   . LBP (low back pain) 06/2005   L5 radicular symptoms; MRI of LS spine severe spondylosis at L4-L5 with central cancal stenosis   . Nocturia   . Organic impotence   . Prostate cancer ([place]) 10/2011   (Low grade) [facility] - Dr. Junious Silk  . Pyelonephritis 03/05/2013  . Renal oncocytoma of right kidney 01/23/2013   As 3.2 x 3.5 cm enhancing exophytic mass projecting off the lower pole. This is consistent with a renal cell carcinoma. No retroperitoneal lymphadenopathy.   [name] Spermatocele   . 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 Wears glasses  Past Surgical History:  Procedure Laterality Date  . COLONOSCOPY  02/15/2006, 2014   Internal hemorrhoids (Dr [name]), last in 2014  . INGUINAL HERNIA REPAIR Right 1980  . LYMPHADENECTOMY Bilateral 02/15/2013   Procedure: LYMPHADENECTOMY WITH INDOCYANINE GREEN DYE INJECTION;  Surgeon: [name], MD;  Location: [facility];  Service: Urology;  Laterality: Bilateral;  . PROSTATE BIOPSY N/A 12/18/2012   Procedure: BIOPSY TRANSRECTAL ULTRASONIC PROSTATE (TUBP);  Surgeon: [name], MD;  Location: [facility];  Service: Urology;  Laterality: N/A;    . PROSTATE BIOPSY  12/05/11   gleason 6, 3/12 cores  . REPAIR UMBILICAL AND VENTRAL HERNIA'S W/ MESH  08-21-2009  . ROBOT ASSISTED LAPAROSCOPIC RADICAL PROSTATECTOMY N/A 02/15/2013   Procedure: ROBOTIC ASSISTED LAPAROSCOPIC RADICAL PROSTATECTOMY;  Surgeon: [name], MD;  Location: [facility];  Service: Urology;  Laterality: N/A;  . ROBOTIC ASSITED PARTIAL NEPHRECTOMY Right 02/15/2013   Procedure: ROBOTIC ASSITED PARTIAL NEPHRECTOMY;  Surgeon: [name], MD;  Location: [facility];  Service: Urology;  Laterality: Right;  . SPERMATOCELECTOMY Left 12/18/2012   Procedure: LEFT SPERMATOCELECTOMY;  Surgeon: [name], MD;  Location: [facility];  Service: Urology;  Laterality: Left;  . TONSILLECTOMY AND ADENOIDECTOMY  as child  . TRANSURETHRAL RESECTION OF PROSTATE  AGE 79    Social History   Socioeconomic History  . Marital status: Married    Spouse name: Not on file  . Number of children: 2  . Years of education: Not on file  . Highest education level: Not on file  Occupational History  . Not on file  Tobacco Use  . Smoking status: Former Smoker    Packs/day: 0.25    Years: 10.00    Pack years: 2.50    Types: Cigarettes    Quit date: 02/28/1981    Years since quitting: 38.4  . Smokeless tobacco: Never Used  Substance and Sexual Activity  . Alcohol use: Yes    Alcohol/week: 1.0 standard drinks    Types: 1 Glasses of wine per week  . Drug use: Not Currently    Types: Flunitrazepam  . Sexual activity: Not on file  Other Topics Concern  . Not on file  Social History Narrative   Married- 58 yrs   2 daughters   5 grandchildren   Never Smoked   Alcohol use-yes (occasional/social)   Retired- Engineer, water      Physician roster:      Urologist - Dr. [name]   Orthopedics - Dr. [name]   Dermatology - Dr. [name]   Social Determinants of Health   Financial Resource Strain:   . Difficulty of Paying Living Expenses:   Food Insecurity:   .  Worried About Charity fundraiser in the Last Year:   . Arboriculturist in the Last Year:   Transportation Needs:   . Film/video editor (Medical):   [name] Lack of Transportation (Non-Medical):   Physical Activity:   . Days of Exercise per Week:   . Minutes of Exercise per Session:   Stress:   . Feeling of Stress :   Social Connections:   . Frequency of Communication with Friends and Family:   . Frequency of Social Gatherings with Friends and Family:   . Attends Religious Services:   . Active Member of Clubs or Organizations:   . Attends Archivist Meetings:   [name] Marital Status:   Intimate Partner Violence:   . Fear of Current or Ex-Partner:   .  Emotionally Abused:   [name] Physically Abused:   . Sexually Abused:     Family History  Problem Relation Age of Onset  . Cancer Mother        colon  . Colon cancer Mother 45  . Pulmonary fibrosis Father        father owned a Radio broadcast assistant (father blamed inhalation of chemicals and fumes)  . Pulmonary fibrosis Brother   . Coronary artery disease Other   . Hyperlipidemia Other   . Colon polyps Neg Hx   . Esophageal cancer Neg Hx   . Rectal cancer Neg Hx   . Stomach cancer Neg Hx     ROS: Fatigue but no fevers or chills, productive cough, hemoptysis, dysphasia, odynophagia, melena, hematochezia, dysuria, hematuria, rash, seizure activity, orthopnea, PND, pedal edema, claudication. Remaining systems are negative.  Physical Exam: Well-developed well-nourished in no acute distress.  Skin is warm and dry.  HEENT is normal.  Neck is supple.  Chest is clear to auscultation with normal expansion.  Cardiovascular exam is regular rate and rhythm.  Abdominal exam nontender or distended. No masses palpated. Extremities show no edema. neuro grossly intact  A/P  1 dyspnea/lower extremity edema-as outlined previously echocardiogram shows normal LV function, BNP is normal, lower extremity venous Dopplers showed no DVT and there is  no pulmonary embolus identified on CTA.  His albumin is also normal and there is no proteinuria to suggest nephrotic syndrome.  For his lower extremity edema we will discontinue Azor to eliminate amlodipine as a potential cause.  I have also asked him to decrease his torsemide to 20 mg every 3 to 4 days to see if his lower extremity edema changes after discontinuing amlodipine.  He is scheduled for pulmonary function tests on Thursday of this week.  He does have a family history of pulmonary fibrosis though this was not evident on recent CT or on physical examination.  If PFTs are significantly abnormal I will ask pulmonary to see him.  If not we will likely proceed with right and left cardiac catheterization to fully exclude coronary disease and measure right heart pressures.  Note we did ambulate and monitor his saturations today.  He became significantly dyspneic but saturations were 94%.  2 coronary artery disease/coronary calcification-continue aspirin and statin.  3 hypertension-blood pressure controlled.  Plan as outlined above.  4 hyperlipidemia-continue statin.  5 carotid artery disease-plan follow-up carotid Dopplers December 2021.  6 thyroid nodule-Per primary care.  [name], MD

## 2019-07-22 ENCOUNTER — Encounter: Payer: Self-pay | Admitting: Cardiology

## 2019-07-22 ENCOUNTER — Ambulatory Visit: Payer: Medicare Other | Admitting: Cardiology

## 2019-07-22 ENCOUNTER — Other Ambulatory Visit: Payer: Self-pay

## 2019-07-22 VITALS — BP 134/68 | HR 61 | Ht 72.0 in | Wt 205.6 lb

## 2019-07-22 DIAGNOSIS — E7800 Pure hypercholesterolemia, unspecified: Secondary | ICD-10-CM

## 2019-07-22 DIAGNOSIS — I10 Essential (primary) hypertension: Secondary | ICD-10-CM

## 2019-07-22 DIAGNOSIS — R0602 Shortness of breath: Secondary | ICD-10-CM

## 2019-07-22 DIAGNOSIS — I2510 Atherosclerotic heart disease of native coronary artery without angina pectoris: Secondary | ICD-10-CM

## 2019-07-22 MED ORDER — OLMESARTAN MEDOXOMIL 20 MG PO TABS: 20.0000 mg | ORAL_TABLET | Freq: Every day | ORAL | 3 refills | Status: DC

## 2019-07-22 NOTE — Patient Instructions (Signed)
Medication Instructions:   STOP AZOR  START OLMESARTAN 20 MG ONCE DAILY  *If you need a refill on your cardiac medications before your next appointment, please call your pharmacy*   Lab Work: If you have labs (blood work) drawn today and your tests are completely normal, you will receive your results only by: [name] MyChart Message (if you have MyChart) OR . A paper copy in the mail If you have any lab test that is abnormal or we need to change your treatment, we will call you to review the results.    Follow-Up: At [facility], you and your health needs are our priority.  As part of our continuing mission to provide you with exceptional heart care, we have created designated Provider Care Teams.  These Care Teams include your primary Cardiologist (physician) and Advanced Practice Providers (APPs -  Physician Assistants and Nurse Practitioners) who all work together to provide you with the care you need, when you need it.  We recommend signing up for the patient portal called "MyChart".  Sign up information is provided on this After Visit Summary.  MyChart is used to connect with patients for Virtual Visits (Telemedicine).  Patients are able to view lab/test results, encounter notes, upcoming appointments, etc.  Non-urgent messages can be sent to your provider as well.   To learn more about what you can do with MyChart, go to [email or web address].    Your next appointment:   4-6 week(s)  The format for your next appointment:   In Person  Provider:   [name], MD

## 2019-07-23 ENCOUNTER — Other Ambulatory Visit (HOSPITAL_COMMUNITY)
Admission: RE | Admit: 2019-07-23 | Discharge: 2019-07-23 | Disposition: A | Discharge status: Home / Self Care | Payer: Medicare Other | Source: Ambulatory Visit | Attending: Cardiology | Admitting: Cardiology

## 2019-07-23 DIAGNOSIS — Z20822 Contact with and (suspected) exposure to covid-19: Secondary | ICD-10-CM | POA: Diagnosis not present

## 2019-07-23 DIAGNOSIS — Z01812 Encounter for preprocedural laboratory examination: Secondary | ICD-10-CM | POA: Diagnosis present

## 2019-07-23 LAB — SARS CORONAVIRUS 2 (TAT 6-24 HRS): SARS Coronavirus 2: NEGATIVE

## 2019-07-25 ENCOUNTER — Ambulatory Visit (HOSPITAL_COMMUNITY)
Admission: RE | Admit: 2019-07-25 | Discharge: 2019-07-25 | Disposition: A | Discharge status: Home / Self Care | Payer: Medicare Other | Source: Ambulatory Visit | Attending: Cardiology | Admitting: Cardiology

## 2019-07-25 ENCOUNTER — Other Ambulatory Visit: Payer: Self-pay

## 2019-07-25 DIAGNOSIS — R0602 Shortness of breath: Secondary | ICD-10-CM | POA: Insufficient documentation

## 2019-07-25 LAB — PULMONARY FUNCTION TEST
DL/VA % pred: 82 %
DL/VA: 3.21 ml/min/mmHg/L
DLCO unc % pred: 73 %
DLCO unc: 19.24 ml/min/mmHg
FEF 25-75 Pre: 1.97 L/sec
FEF2575-%Pred-Pre: 87 %
FEV1-%Pred-Pre: 89 %
FEV1-Pre: 2.87 L
FEV1FVC-%Pred-Pre: 98 %
FEV6-%Pred-Pre: 93 %
FEV6-Pre: 3.9 L
FEV6FVC-%Pred-Pre: 102 %
FVC-%Pred-Pre: 91 %
FVC-Pre: 4.06 L
Pre FEV1/FVC ratio: 71 %
Pre FEV6/FVC Ratio: 96 %

## 2019-07-28 NOTE — Progress Notes (Signed)
Cardiology Office Note:    Date:  07/31/2019   ID:  David [name], DOB [birth year], MRN [id number]  PCP:  Cassandria Anger, MD  Cardiologist:  [name], MD  Electrophysiologist:  None   Referring MD: Cassandria Anger, MD   Chief Complaint: dyspnea   History of Present Illness:    David [name] is a 79 y.o. male with a history of coronary calcifications on CT in 01/2014, bilateral carotid artery stenosis (right > left) on dopplers in 01/2019, hypertension, hyperlipidemia, pulmonary nodule, thyroid nodule, GERD, BPH, and arthritis who is followed by Dr. Stanford Breed and presents today for follow-up of dyspnea and to arrange cardiac catheterization.   Patient was first referred to Dr. [name] in 06/2016 for further evaluation of dyspnea on exertion. He denied any chest pain or any other CHF symptoms at the time. Dr. Stanford Breed ordered ETT and Echo for further evaluation. He was noted to have mildly impaired exercise intolerance on ETT bu no evidence of ischemia. Echo showed LVEF of 55-60% with grade 1 diastolic dysfunction and mild dilated ascending aorta. He was seen by Dr. Stanford Breed in 06/2019 and noted continued dyspnea on exertion, progressive fatigue, and bilateral lower extremity edema over the prior 6 weeks. PCP had already done a thorough work-up. Chest CTA in 05/2019 was negative for PE and Echo showed LVEF of 60-65% with normal wall motion, grade 1 diastolic function, and mildly dilated aortic root and ascending aorta measuring 55mm and 91mm, respectively. BNP was normal. Lower extremity dopplers were also checked and were negative for DVT. PCP started patient on Torsemide. Additional lab work  looking at renal and liver function as well as repeat BNP were ordered and were unremarkable. He saw Dr. Stanford Breed again on 07/22/2019 at which time PFTs were ordered for further evaluation of dyspnea. Azor was also discontinued in order to eliminate Amlodipine as a potential cause of his lower  extremity edema. PFTs showed on minimal obstructive disease not felt to explain dyspnea. Dr. Stanford Breed recommended patient come in to office to set up right/left cardiac catheterization for definitive evaluations. Patient presents for this today.  Patient here today with wife. He continues to report dyspnea with relatively minimal activity. Lower extremity edema has improved significantly off the Amlodipine. No chest pain, shortness of breath, orthopnea, PND. He notes occasional lightheadedness if he stands up too quickly but no palpitations, falls, or syncope. Discussed right/left heart catheterization with patient and he is willing to proceed.  Past Medical History:  Diagnosis Date  . Allergy   . Arthritis    gen.  and left hip  . BPH (benign prostatic hypertrophy)   . GERD (gastroesophageal reflux disease)    occasional  . History of kidney stones    2007  . Hyperlipidemia   . Hypertension   . Kidney tumor   . LBP (low back pain) 06/2005   L5 radicular symptoms; MRI of LS spine severe spondylosis at L4-L5 with central cancal stenosis   . Nocturia   . Organic impotence   . Prostate cancer ([place]) 10/2011   (Low grade) [facility] - Dr. Junious Silk  . Pyelonephritis 03/05/2013  . Renal oncocytoma of right kidney 01/23/2013   As 3.2 x 3.5 cm enhancing exophytic mass projecting off the lower pole. This is consistent with a renal cell carcinoma. No retroperitoneal lymphadenopathy.   [name] Spermatocele   . 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 Wears glasses     Past Surgical History:  Procedure Laterality Date  . COLONOSCOPY  02/15/2006, 2014   Internal hemorrhoids (Dr [name]), last in 2014  . INGUINAL HERNIA REPAIR Right 1980  . LYMPHADENECTOMY Bilateral 02/15/2013   Procedure: LYMPHADENECTOMY WITH INDOCYANINE GREEN DYE INJECTION;  Surgeon: [name], MD;  Location: [facility];  Service: Urology;  Laterality: Bilateral;  . PROSTATE BIOPSY N/A 12/18/2012   Procedure: BIOPSY TRANSRECTAL ULTRASONIC  PROSTATE (TUBP);  Surgeon: [name], MD;  Location: [facility];  Service: Urology;  Laterality: N/A;  . PROSTATE BIOPSY  12/05/11   gleason 6, 3/12 cores  . REPAIR UMBILICAL AND VENTRAL HERNIA'S W/ MESH  08-21-2009  . ROBOT ASSISTED LAPAROSCOPIC RADICAL PROSTATECTOMY N/A 02/15/2013   Procedure: ROBOTIC ASSISTED LAPAROSCOPIC RADICAL PROSTATECTOMY;  Surgeon: [name], MD;  Location: [facility];  Service: Urology;  Laterality: N/A;  . ROBOTIC ASSITED PARTIAL NEPHRECTOMY Right 02/15/2013   Procedure: ROBOTIC ASSITED PARTIAL NEPHRECTOMY;  Surgeon: [name], MD;  Location: [facility];  Service: Urology;  Laterality: Right;  . SPERMATOCELECTOMY Left 12/18/2012   Procedure: LEFT SPERMATOCELECTOMY;  Surgeon: [name], MD;  Location: [facility];  Service: Urology;  Laterality: Left;  . TONSILLECTOMY AND ADENOIDECTOMY  as child  . TRANSURETHRAL RESECTION OF PROSTATE  AGE 48    Current Medications: Current Meds  Medication Sig  . aspirin EC 81 MG tablet Take 81 mg by mouth daily.  [name] atorvastatin (LIPITOR) 80 MG tablet Take 1 tablet (80 mg total) by mouth daily at 6 PM.  . cetirizine (ZYRTEC) 10 MG tablet Take 1 tablet (10 mg total) by mouth daily.  . DULoxetine (CYMBALTA) 30 MG capsule TAKE 1 CAPSULE(30 MG) BY MOUTH DAILY  . fluticasone (FLONASE) 50 MCG/ACT nasal spray SHAKE LIQUID AND USE 2 SPRAYS IN EACH NOSTRIL DAILY  . Iodoquinol-HC (HYDROCORTISONE-IODOQUINOL) 1-1 % CREA APP ON THE SKIN BID  . olmesartan (BENICAR) 20 MG tablet Take 1 tablet (20 mg total) by mouth daily.  . pantoprazole (PROTONIX) 40 MG tablet Take 1 tablet (40 mg total) by mouth 2 (two) times daily.  . sodium chloride (OCEAN) 0.65 % nasal spray Place 1 spray into the nose daily as needed for congestion.  . torsemide (DEMADEX) 20 MG tablet Take 2 tablets (40 mg total) by mouth daily.  [name] VITAMIN D, CHOLECALCIFEROL, PO Take 1 tablet by mouth daily.     Allergies:    Codeine, Ace inhibitors, Amlodipine, and Percocet [oxycodone-acetaminophen]   Social History   Socioeconomic History  . Marital status: Married    Spouse name: Not on file  . Number of children: 2  . Years of education: Not on file  . Highest education level: Not on file  Occupational History  . Not on file  Tobacco Use  . Smoking status: Former Smoker    Packs/day: 0.25    Years: 10.00    Pack years: 2.50    Types: Cigarettes    Quit date: 02/28/1981    Years since quitting: 38.4  . Smokeless tobacco: Never Used  Substance and Sexual Activity  . Alcohol use: Yes    Alcohol/week: 1.0 standard drinks    Types: 1 Glasses of wine per week  . Drug use: Not Currently    Types: Flunitrazepam  . Sexual activity: Not on file  Other Topics Concern  . Not on file  Social History Narrative   Married- 47 yrs   2 daughters   5 grandchildren   Never Smoked   Alcohol use-yes (occasional/social)   Retired- Engineer, water  Physician roster:      Urologist - Dr. [name]   Orthopedics - Dr. [name]   Dermatology - Dr. [name]   Social Determinants of Health   Financial Resource Strain:   . Difficulty of Paying Living Expenses:   Food Insecurity:   . Worried About Charity fundraiser in the Last Year:   . Arboriculturist in the Last Year:   Transportation Needs:   . Film/video editor (Medical):   [name] Lack of Transportation (Non-Medical):   Physical Activity:   . Days of Exercise per Week:   . Minutes of Exercise per Session:   Stress:   . Feeling of Stress :   Social Connections:   . Frequency of Communication with Friends and Family:   . Frequency of Social Gatherings with Friends and Family:   . Attends Religious Services:   . Active Member of Clubs or Organizations:   . Attends Archivist Meetings:   [name] Marital Status:      Family History: The patient's family history includes Cancer in his mother; Colon cancer (age of onset: 33) in his mother;  Coronary artery disease in an other family member; Hyperlipidemia in an other family member; Pulmonary fibrosis in his brother and father. There is no history of Colon polyps, Esophageal cancer, Rectal cancer, or Stomach cancer.  ROS:   Please see the history of present illness.    All other systems reviewed and are negative.  EKGs/Labs/Other Studies Reviewed:    The following studies were reviewed today:  Carotid Ultrasounds 02/04/2019: Summary:  - Right Carotid: Velocities in the right ICA are consistent with a 40-59% stenosis.  - Left Carotid: Velocities in the left ICA are consistent with a 1-39% stenosis.  - Vertebrals: Bilateral vertebral arteries demonstrate antegrade flow.  - Subclavians: Normal flow hemodynamics were seen in bilateral subclavian arteries.   - Incidental findings: well-defined avascular hypoechoic nodule in the inferior pole of the right thyroid, measuring .94 x .99 x .94 cm.  _______________  Lower Extremity Dopplers 06/06/2019: Summary:  Right - No evidence of deep vein thrombosis in the lower extremity. No indirect  evidence of obstruction proximal to the inguinal ligament.  - No cystic structure found in the popliteal fossa.   Left - No evidence of deep vein thrombosis in the lower extremity. No indirect  evidence of obstruction proximal to the inguinal ligament.  - No cystic structure found in the popliteal fossa. _______________  Echocardiogram 06/13/2019: Impressions: 1. Left ventricular ejection fraction, by estimation, is 60 to 65%. The  left ventricle has normal function. The left ventricle has no regional  wall motion abnormalities. Left ventricular diastolic parameters are  consistent with Grade I diastolic  dysfunction (impaired relaxation).  2. Right ventricular systolic function is normal. The right ventricular  size is normal.  3. Left atrial size was mildly dilated.  4. The mitral valve is normal in structure. No evidence of mitral  valve  regurgitation. No evidence of mitral stenosis.  5. The aortic valve is grossly normal. Aortic valve regurgitation is  trivial. No aortic stenosis is present.  6. Aortic dilatation noted. There is mild dilatation of the aortic root  and of the ascending aorta measuring 42 and 43 mm.  EKG:  EKG ordered today. EKG personally reviewed and demonstrates normal sinus rhythm, rate 61 bpm, with RBBB.  Recent Labs: 05/30/2019: Hemoglobin 13.6; Platelets 211.0; TSH 1.42 07/01/2019: ALT 30; BUN 20; Creatinine, Ser 0.93; NT-Pro BNP 96; Potassium  4.3; Sodium 144  Recent Lipid Panel    Component Value Date/Time   CHOL 161 02/04/2019 1010   TRIG 95 02/04/2019 1010   HDL 45 02/04/2019 1010   CHOLHDL 3.6 02/04/2019 1010   CHOLHDL 3 10/04/2017 0829   VLDL 13.6 10/04/2017 0829   LDLCALC 98 02/04/2019 1010   LDLDIRECT 122.1 03/05/2012 1619    Physical Exam:    Vital Signs: BP (!) 142/58   Pulse 61   Ht 6' (1.829 m)   Wt 208 lb 9.6 oz (94.6 kg)   SpO2 98%   BMI 28.29 kg/m     Wt Readings from Last 3 Encounters:  07/31/19 208 lb 9.6 oz (94.6 kg)  07/31/19 210 lb (95.3 kg)  07/22/19 205 lb 9.6 oz (93.3 kg)     General: 79 y.o. male in no acute distress. HEENT: Normocephalic and atraumatic. Sclera clear. EOMs intact. Neck: Supple. No JVD. Heart: RRR. Distinct S1 and S2. No murmurs, gallops, or rubs. Radial pulses 2+ and equal bilaterally. Lungs: No increased work of breathing. Clear to ausculation bilaterally. No wheezes, rhonchi, or rales.  Abdomen: Soft, non-distended, and non-tender to palpation.  Extremities: Trace pitting edema of bilateral lower extremities.   Skin: Warm and dry. Neuro: No focal deficits. Psych: Normal affect. Responds appropriately.   Assessment:    1. Dyspnea on exertion   2. Lower extremity edema   3. Coronary artery calcification   4. Bilateral carotid artery stenosis   5. Essential hypertension   6. Hyperlipidemia, unspecified hyperlipidemia type     7. Thyroid nodule     Plan:    Dyspnea on Exertion Work-up so far has been unremarkable. Echo showed normal LV function with only grade 1 diastolic dysfunction. BNP normal. Chest CTA negative for PE. PFTs showed minimal obstructive disease but this was not felt to explain dyspnea. Dr. Stanford Breed recommends right/left cardiac catheterization for definitive evaluation. Will arrange for this as an outpatient. The patient understands that risks include but are not limited to stroke (1 in 1000), death (1 in 4), kidney failure [usually temporary] (1 in 500), bleeding (1 in 200), allergic reaction [possibly serious] (1 in 200), and agrees to proceed. Scheduled for Tuesday 08/06/2019 at 7:30am with Dr. [name]. Recommend holding Torsemide and Olmesartan on morning of cath. Will check pre-procedural labs today (CBC and BMET).  Lower Extremity Edema Lower extremities dopplers negative for DVT. Azor was stopped at last visit to eliminate Amlodipine and edema improved. Continue Torsemide 20mg  as needed for edema.   Coronary Calcifications Noted on CT scans going back to 2015. Continue aspirin and statin. Planning left/right cardiac catheterization as stated above.  Carotid Artery Disease Carotid dopplers in 01/2019 showed 40-59% stenosis of right ICA and 1-30% stenosis of left ICA. Continue aspirin and statin. Will need repeat dopplers in 01/2020.   Hypertension BP mildly elevated at 142/58. Continue Olmesartan 20mg  daily for now. BP goal <130/80. Patient to monitor BP at home and will let us know if consistently above goal. Will likely increase Olmesartan to 40mg  daily at that time.  Hyperlipidemia Continue Lipitor 80mg  daily.   Thyroid Nodule Incidentally found on carotid ultrasounds. Follow-up per primary team.  Disposition: Patient already has follow-up with Dr. Stanford Breed scheduled for 09/23/2019. If patient has PCI performed, will to be see sooner.   Medication Adjustments/Labs and Tests  Ordered: Current medicines are reviewed at length with the patient today.  Concerns regarding medicines are outlined above.  Orders Placed This Encounter  Procedures  .  Basic metabolic panel  . CBC  . EKG 12-Lead   No orders of the defined types were placed in this encounter.   Patient Instructions     [facility] [place] [address] Dept: [phone] Loc: [place]  07/31/2019  You are scheduled for a Cardiac Catheterization on Tuesday, June 8 with Dr. Peter [name].  1. Please arrive at the [facility] (Main Entrance A) at [facility]: [address] [place], [place] [address] at 5:30 AM (This time is two hours before your procedure to ensure your preparation). Free valet parking service is available.   Special note: Every effort is made to have your procedure done on time. Please understand that emergencies sometimes delay scheduled procedures.  2. Diet: Do not eat solid foods after midnight. You may have clear liquids until 5am upon the day of the procedure.  3. Labs: You will need to have blood drawn today: BMET, CBC  4. Medication instructions in preparation for your procedure:  Stop taking, Torsemide (Demadex) Tuesday, June 8,---DO NOT TAKE this or the Olmesartan on the morning of your procedure.  On the morning of your procedure, take your Aspirin and any morning medicines NOT listed above.  You may use sips of water.  5. Plan for one night stay--bring personal belongings. 6. Bring a current list of your medications and current insurance cards. 7. You MUST have a responsible person to drive you home. 8. Someone MUST be with you the first 24 hours after you arrive home or your discharge will be delayed. 9. Please wear clothes that are easy to get on and off and wear slip-on shoes.  Thank you for allowing [place] to care for you!   -- [facility]   COVID TEST ON Friday 08/02/19 AT 1:15 PM AT [place].    Signed, Darreld Mclean, PA-C  07/31/2019 4:07 PM    [facility]

## 2019-07-28 NOTE — H&P (View-Only) (Signed)
Cardiology Office Note:    Date:  07/31/2019   ID:  David [name], DOB [birth year], MRN [id number]  PCP:  David Anger, MD  Cardiologist:  David Ruths, MD  Electrophysiologist:  None   Referring MD: David Anger, MD   Chief Complaint: dyspnea   History of Present Illness:    David [name] is a 79 y.o. male with a history of coronary calcifications on CT in 01/2014, bilateral carotid artery stenosis (right > left) on dopplers in 01/2019, hypertension, hyperlipidemia, pulmonary nodule, thyroid nodule, GERD, BPH, and arthritis who is followed by Dr. Stanford [name] and presents today for follow-up of dyspnea and to arrange cardiac catheterization.   Patient was first referred to David [name] in 06/2016 for further evaluation of dyspnea on exertion. He denied any chest pain or any other CHF symptoms at the time. Dr. Stanford [name] ordered ETT and Echo for further evaluation. He was noted to have mildly impaired exercise intolerance on ETT bu no evidence of ischemia. Echo showed LVEF of 55-60% with grade 1 diastolic dysfunction and mild dilated ascending aorta. He was seen by Dr. Stanford [name] in 06/2019 and noted continued dyspnea on exertion, progressive fatigue, and bilateral lower extremity edema over the prior 6 weeks. PCP had already done a thorough work-up. Chest CTA in 05/2019 was negative for PE and Echo showed LVEF of 60-65% with normal wall motion, grade 1 diastolic function, and mildly dilated aortic root and ascending aorta measuring 36mm and 44mm, respectively. BNP was normal. Lower extremity dopplers were also checked and were negative for DVT. PCP started patient on Torsemide. Additional lab work  looking at renal and liver function as well as repeat BNP were ordered and were unremarkable. He saw Dr. Stanford [name] again on 07/22/2019 at which time PFTs were ordered for further evaluation of dyspnea. Azor was also discontinued in order to eliminate Amlodipine as a potential cause of his lower  extremity edema. PFTs showed on minimal obstructive disease not felt to explain dyspnea. Dr. Stanford [name] recommended patient come in to office to set up right/left cardiac catheterization for definitive evaluations. Patient presents for this today.  Patient here today with wife. He continues to report dyspnea with relatively minimal activity. Lower extremity edema has improved significantly off the Amlodipine. No chest pain, shortness of breath, orthopnea, PND. He notes occasional lightheadedness if he stands up too quickly but no palpitations, falls, or syncope. Discussed right/left heart catheterization with patient and he is willing to proceed.  Past Medical History:  Diagnosis Date  . Allergy   . Arthritis    gen.  and left hip  . BPH (benign prostatic hypertrophy)   . GERD (gastroesophageal reflux disease)    occasional  . History of kidney stones    2007  . Hyperlipidemia   . Hypertension   . Kidney tumor   . LBP (low back pain) 06/2005   L5 radicular symptoms; MRI of LS spine severe spondylosis at L4-L5 with central cancal stenosis   . Nocturia   . Organic impotence   . Prostate cancer ([place]) 10/2011   (Low grade) [facility] - David [name]  . Pyelonephritis 03/05/2013  . Renal oncocytoma of right kidney 01/23/2013   As 3.2 x 3.5 cm enhancing exophytic mass projecting off the lower pole. This is consistent with a renal cell carcinoma. No retroperitoneal lymphadenopathy.   [name] Spermatocele   . 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 Wears glasses     Past Surgical History:  Procedure Laterality Date  . COLONOSCOPY  02/15/2006, 2014   Internal hemorrhoids (Dr David [name]), last in 2014  . INGUINAL HERNIA REPAIR Right 1980  . LYMPHADENECTOMY Bilateral 02/15/2013   Procedure: LYMPHADENECTOMY WITH INDOCYANINE GREEN DYE INJECTION;  Surgeon: David Frock, MD;  Location: [facility];  Service: Urology;  Laterality: Bilateral;  . PROSTATE BIOPSY N/A 12/18/2012   Procedure: BIOPSY TRANSRECTAL ULTRASONIC  PROSTATE (TUBP);  Surgeon: David Bonine, MD;  Location[facility]  Service: Urology;  Laterality: N/A;  . PROSTATE BIOPSY  12/05/11   gleason 6, 3/12 cores  . REPAIR UMBILICAL AND VENTRAL HERNIA'S W/ MESH  08-21-2009  . ROBOT ASSISTED LAPAROSCOPIC RADICAL PROSTATECTOMY N/A 02/15/2013   Procedure: ROBOTIC ASSISTED LAPAROSCOPIC RADICAL PROSTATECTOMY;  Surgeon: David Frock, MD;  Location: [facility];  Service: Urology;  Laterality: N/A;  . ROBOTIC ASSITED PARTIAL NEPHRECTOMY Right 02/15/2013   Procedure: ROBOTIC ASSITED PARTIAL NEPHRECTOMY;  Surgeon: David Frock, MD;  Location: [facility];  Service: Urology;  Laterality: Right;  . SPERMATOCELECTOMY Left 12/18/2012   Procedure: LEFT SPERMATOCELECTOMY;  Surgeon: David Bonine, MD;  Location: [facility];  Service: Urology;  Laterality: Left;  . TONSILLECTOMY AND ADENOIDECTOMY  as child  . TRANSURETHRAL RESECTION OF PROSTATE  AGE 52    Current Medications: Current Meds  Medication Sig  . aspirin EC 81 MG tablet Take 81 mg by mouth daily.  [name] atorvastatin (LIPITOR) 80 MG tablet Take 1 tablet (80 mg total) by mouth daily at 6 PM.  . cetirizine (ZYRTEC) 10 MG tablet Take 1 tablet (10 mg total) by mouth daily.  . DULoxetine (CYMBALTA) 30 MG capsule TAKE 1 CAPSULE(30 MG) BY MOUTH DAILY  . fluticasone (FLONASE) 50 MCG/ACT nasal spray SHAKE LIQUID AND USE 2 SPRAYS IN EACH NOSTRIL DAILY  . Iodoquinol-HC (HYDROCORTISONE-IODOQUINOL) 1-1 % CREA APP ON THE SKIN BID  . olmesartan (BENICAR) 20 MG tablet Take 1 tablet (20 mg total) by mouth daily.  . pantoprazole (PROTONIX) 40 MG tablet Take 1 tablet (40 mg total) by mouth 2 (two) times daily.  . sodium chloride (OCEAN) 0.65 % nasal spray Place 1 spray into the nose daily as needed for congestion.  . torsemide (DEMADEX) 20 MG tablet Take 2 tablets (40 mg total) by mouth daily.  [name] VITAMIN D, CHOLECALCIFEROL, PO Take 1 tablet by mouth daily.     Allergies:    Codeine, Ace inhibitors, Amlodipine, and Percocet [oxycodone-acetaminophen]   Social History   Socioeconomic History  . Marital status: Married    Spouse name: Not on file  . Number of children: 2  . Years of education: Not on file  . Highest education level: Not on file  Occupational History  . Not on file  Tobacco Use  . Smoking status: Former Smoker    Packs/day: 0.25    Years: 10.00    Pack years: 2.50    Types: Cigarettes    Quit date: 02/28/1981    Years since quitting: 38.4  . Smokeless tobacco: Never Used  Substance and Sexual Activity  . Alcohol use: Yes    Alcohol/week: 1.0 standard drinks    Types: 1 Glasses of wine per week  . Drug use: Not Currently    Types: Flunitrazepam  . Sexual activity: Not on file  Other Topics Concern  . Not on file  Social History Narrative   Married- 69 yrs   2 daughters   5 grandchildren   Never Smoked   Alcohol use-yes (occasional/social)   Retired- Engineer, water  Physician roster:      Urologist - Dr. [name]   Orthopedics - Dr. [name]   Dermatology - Dr. [name]   Social Determinants of Health   Financial Resource Strain:   . Difficulty of Paying Living Expenses:   Food Insecurity:   . Worried About Charity fundraiser in the Last Year:   . Arboriculturist in the Last Year:   Transportation Needs:   . Film/video editor (Medical):   [name] Lack of Transportation (Non-Medical):   Physical Activity:   . Days of Exercise per Week:   . Minutes of Exercise per Session:   Stress:   . Feeling of Stress :   Social Connections:   . Frequency of Communication with Friends and Family:   . Frequency of Social Gatherings with Friends and Family:   . Attends Religious Services:   . Active Member of Clubs or Organizations:   . Attends Archivist Meetings:   [name] Marital Status:      Family History: The patient's family history includes Cancer in his mother; Colon cancer (age of onset: 90) in his mother;  Coronary artery disease in an other family member; Hyperlipidemia in an other family member; Pulmonary fibrosis in his brother and father. There is no history of Colon polyps, Esophageal cancer, Rectal cancer, or Stomach cancer.  ROS:   Please see the history of present illness.    All other systems reviewed and are negative.  EKGs/Labs/Other Studies Reviewed:    The following studies were reviewed today:  Carotid Ultrasounds 02/04/2019: Summary:  - Right Carotid: Velocities in the right ICA are consistent with a 40-59% stenosis.  - Left Carotid: Velocities in the left ICA are consistent with a 1-39% stenosis.  - Vertebrals: Bilateral vertebral arteries demonstrate antegrade flow.  - Subclavians: Normal flow hemodynamics were seen in bilateral subclavian arteries.   - Incidental findings: well-defined avascular hypoechoic nodule in the inferior pole of the right thyroid, measuring .94 x .99 x .94 cm.  _______________  Lower Extremity Dopplers 06/06/2019: Summary:  Right - No evidence of deep vein thrombosis in the lower extremity. No indirect  evidence of obstruction proximal to the inguinal ligament.  - No cystic structure found in the popliteal fossa.   Left - No evidence of deep vein thrombosis in the lower extremity. No indirect  evidence of obstruction proximal to the inguinal ligament.  - No cystic structure found in the popliteal fossa. _______________  Echocardiogram 06/13/2019: Impressions: 1. Left ventricular ejection fraction, by estimation, is 60 to 65%. The  left ventricle has normal function. The left ventricle has no regional  wall motion abnormalities. Left ventricular diastolic parameters are  consistent with Grade I diastolic  dysfunction (impaired relaxation).  2. Right ventricular systolic function is normal. The right ventricular  size is normal.  3. Left atrial size was mildly dilated.  4. The mitral valve is normal in structure. No evidence of mitral  valve  regurgitation. No evidence of mitral stenosis.  5. The aortic valve is grossly normal. Aortic valve regurgitation is  trivial. No aortic stenosis is present.  6. Aortic dilatation noted. There is mild dilatation of the aortic root  and of the ascending aorta measuring 42 and 43 mm.  EKG:  EKG ordered today. EKG personally reviewed and demonstrates normal sinus rhythm, rate 61 bpm, with RBBB.  Recent Labs: 05/30/2019: Hemoglobin 13.6; Platelets 211.0; TSH 1.42 07/01/2019: ALT 30; BUN 20; Creatinine, Ser 0.93; NT-Pro BNP 96; Potassium  4.3; Sodium 144  Recent Lipid Panel    Component Value Date/Time   CHOL 161 02/04/2019 1010   TRIG 95 02/04/2019 1010   HDL 45 02/04/2019 1010   CHOLHDL 3.6 02/04/2019 1010   CHOLHDL 3 10/04/2017 0829   VLDL 13.6 10/04/2017 0829   LDLCALC 98 02/04/2019 1010   LDLDIRECT 122.1 03/05/2012 1619    Physical Exam:    Vital Signs: BP (!) 142/58   Pulse 61   Ht 6' (1.829 m)   Wt 208 lb 9.6 oz (94.6 kg)   SpO2 98%   BMI 28.29 kg/m     Wt Readings from Last 3 Encounters:  07/31/19 208 lb 9.6 oz (94.6 kg)  07/31/19 210 lb (95.3 kg)  07/22/19 205 lb 9.6 oz (93.3 kg)     General: 79 y.o. male in no acute distress. HEENT: Normocephalic and atraumatic. Sclera clear. EOMs intact. Neck: Supple. No JVD. Heart: RRR. Distinct S1 and S2. No murmurs, gallops, or rubs. Radial pulses 2+ and equal bilaterally. Lungs: No increased work of breathing. Clear to ausculation bilaterally. No wheezes, rhonchi, or rales.  Abdomen: Soft, non-distended, and non-tender to palpation.  Extremities: Trace pitting edema of bilateral lower extremities.   Skin: Warm and dry. Neuro: No focal deficits. Psych: Normal affect. Responds appropriately.   Assessment:    1. Dyspnea on exertion   2. Lower extremity edema   3. Coronary artery calcification   4. Bilateral carotid artery stenosis   5. Essential hypertension   6. Hyperlipidemia, unspecified hyperlipidemia type     7. Thyroid nodule     Plan:    Dyspnea on Exertion Work-up so far has been unremarkable. Echo showed normal LV function with only grade 1 diastolic dysfunction. BNP normal. Chest CTA negative for PE. PFTs showed minimal obstructive disease but this was not felt to explain dyspnea. Dr. Stanford [name] recommends right/left cardiac catheterization for definitive evaluation. Will arrange for this as an outpatient. The patient understands that risks include but are not limited to stroke (1 in 1000), death (1 in 77), kidney failure [usually temporary] (1 in 500), bleeding (1 in 200), allergic reaction [possibly serious] (1 in 200), and agrees to proceed. Scheduled for Tuesday 08/06/2019 at 7:30am with David [name]. Recommend holding Torsemide and Olmesartan on morning of cath. Will check pre-procedural labs today (CBC and BMET).  Lower Extremity Edema Lower extremities dopplers negative for DVT. Azor was stopped at last visit to eliminate Amlodipine and edema improved. Continue Torsemide 20mg  as needed for edema.   Coronary Calcifications Noted on CT scans going back to 2015. Continue aspirin and statin. Planning left/right cardiac catheterization as stated above.  Carotid Artery Disease Carotid dopplers in 01/2019 showed 40-59% stenosis of right ICA and 1-30% stenosis of left ICA. Continue aspirin and statin. Will need repeat dopplers in 01/2020.   Hypertension BP mildly elevated at 142/58. Continue Olmesartan 20mg  daily for now. BP goal <130/80. Patient to monitor BP at home and will let us know if consistently above goal. Will likely increase Olmesartan to 40mg  daily at that time.  Hyperlipidemia Continue Lipitor 80mg  daily.   Thyroid Nodule Incidentally found on carotid ultrasounds. Follow-up per primary team.  Disposition: Patient already has follow-up with Dr. Stanford [name] scheduled for 09/23/2019. If patient has PCI performed, will to be see sooner.   Medication Adjustments/Labs and Tests  Ordered: Current medicines are reviewed at length with the patient today.  Concerns regarding medicines are outlined above.  Orders Placed This Encounter  Procedures  .  Basic metabolic panel  . CBC  . EKG 12-Lead   No orders of the defined types were placed in this encounter.   Patient Instructions     [place] [place] [address] Dept: [phone] Loc: [place]  07/31/2019  You are scheduled for a Cardiac Catheterization on Tuesday, June 8 with Dr. Peter [name].  1. Please arrive at the [facility] (Main Entrance A) at [facility]: [address] [place], [place] [address] at 5:30 AM (This time is two hours before your procedure to ensure your preparation). Free valet parking service is available.   Special note: Every effort is made to have your procedure done on time. Please understand that emergencies sometimes delay scheduled procedures.  2. Diet: Do not eat solid foods after midnight. You may have clear liquids until 5am upon the day of the procedure.  3. Labs: You will need to have blood drawn today: BMET, CBC  4. Medication instructions in preparation for your procedure:  Stop taking, Torsemide (Demadex) Tuesday, June 8,---DO NOT TAKE this or the Olmesartan on the morning of your procedure.  On the morning of your procedure, take your Aspirin and any morning medicines NOT listed above.  You may use sips of water.  5. Plan for one night stay--bring personal belongings. 6. Bring a current list of your medications and current insurance cards. 7. You MUST have a responsible person to drive you home. 8. Someone MUST be with you the first 24 hours after you arrive home or your discharge will be delayed. 9. Please wear clothes that are easy to get on and off and wear slip-on shoes.  Thank you for allowing [place] to care for you!   -- [facility]   COVID TEST ON Friday 08/02/19 AT 1:15 PM AT [place].    Signed, Darreld Mclean, PA-C  07/31/2019 4:07 PM    [facility]

## 2019-07-31 ENCOUNTER — Ambulatory Visit: Payer: Medicare Other | Admitting: Student

## 2019-07-31 ENCOUNTER — Encounter: Payer: Self-pay | Admitting: Internal Medicine

## 2019-07-31 ENCOUNTER — Ambulatory Visit: Payer: Medicare Other | Admitting: Internal Medicine

## 2019-07-31 ENCOUNTER — Other Ambulatory Visit: Payer: Self-pay

## 2019-07-31 ENCOUNTER — Encounter: Payer: Self-pay | Admitting: Student

## 2019-07-31 VITALS — BP 142/58 | HR 61 | Ht 72.0 in | Wt 208.6 lb

## 2019-07-31 DIAGNOSIS — R0600 Dyspnea, unspecified: Secondary | ICD-10-CM

## 2019-07-31 DIAGNOSIS — R0609 Other forms of dyspnea: Secondary | ICD-10-CM

## 2019-07-31 DIAGNOSIS — R600 Localized edema: Secondary | ICD-10-CM

## 2019-07-31 DIAGNOSIS — R5383 Other fatigue: Secondary | ICD-10-CM

## 2019-07-31 DIAGNOSIS — I10 Essential (primary) hypertension: Secondary | ICD-10-CM

## 2019-07-31 DIAGNOSIS — I6523 Occlusion and stenosis of bilateral carotid arteries: Secondary | ICD-10-CM | POA: Diagnosis not present

## 2019-07-31 DIAGNOSIS — E041 Nontoxic single thyroid nodule: Secondary | ICD-10-CM

## 2019-07-31 DIAGNOSIS — I2584 Coronary atherosclerosis due to calcified coronary lesion: Secondary | ICD-10-CM

## 2019-07-31 DIAGNOSIS — E785 Hyperlipidemia, unspecified: Secondary | ICD-10-CM

## 2019-07-31 DIAGNOSIS — I2510 Atherosclerotic heart disease of native coronary artery without angina pectoris: Secondary | ICD-10-CM

## 2019-07-31 NOTE — Assessment & Plan Note (Signed)
Not better DOE - not better Heart cath is planned

## 2019-07-31 NOTE — Progress Notes (Signed)
Subjective:  Patient ID: David [name], male    DOB: [birth year]  Age: 79 y.o. MRN: [id number]  CC: No chief complaint on file.   HPI David [name] presents for DOE, fatigue - not better... Just had PFTs  Outpatient Medications Prior to Visit  Medication Sig Dispense Refill  . aspirin EC 81 MG tablet Take 81 mg by mouth daily.    [name] atorvastatin (LIPITOR) 80 MG tablet Take 1 tablet (80 mg total) by mouth daily at 6 PM. 90 tablet 3  . cetirizine (ZYRTEC) 10 MG tablet Take 1 tablet (10 mg total) by mouth daily. 90 tablet 2  . DULoxetine (CYMBALTA) 30 MG capsule TAKE 1 CAPSULE(30 MG) BY MOUTH DAILY 90 capsule 2  . fluticasone (FLONASE) 50 MCG/ACT nasal spray SHAKE LIQUID AND USE 2 SPRAYS IN EACH NOSTRIL DAILY 48 g 1  . Iodoquinol-HC (HYDROCORTISONE-IODOQUINOL) 1-1 % CREA APP ON THE SKIN BID  2  . olmesartan (BENICAR) 20 MG tablet Take 1 tablet (20 mg total) by mouth daily. 90 tablet 3  . pantoprazole (PROTONIX) 40 MG tablet Take 1 tablet (40 mg total) by mouth 2 (two) times daily. 60 tablet 11  . sodium chloride (OCEAN) 0.65 % nasal spray Place 1 spray into the nose daily as needed for congestion.    . torsemide (DEMADEX) 20 MG tablet Take 2 tablets (40 mg total) by mouth daily. 60 tablet 5  . VITAMIN D, CHOLECALCIFEROL, PO Take 1 tablet by mouth daily.     No facility-administered medications prior to visit.    ROS: Review of Systems  Constitutional: Positive for fatigue. Negative for appetite change and unexpected weight change.  HENT: Negative for congestion, nosebleeds, sneezing, sore throat and trouble swallowing.   Eyes: Negative for itching and visual disturbance.  Respiratory: Positive for shortness of breath. Negative for cough.   Cardiovascular: Negative for chest pain, palpitations and leg swelling.  Gastrointestinal: Negative for abdominal distention, blood in stool, diarrhea and nausea.  Genitourinary: Negative for frequency and hematuria.  Musculoskeletal:  Negative for back pain, gait problem, joint swelling and neck pain.  Skin: Negative for rash.  Neurological: Negative for dizziness, tremors, speech difficulty and weakness.  Psychiatric/Behavioral: Negative for agitation, dysphoric mood, sleep disturbance and suicidal ideas. The patient is not nervous/anxious.     Objective:  BP (!) 144/80 (BP Location: Left Arm, Patient Position: Sitting, Cuff Size: Large)   Pulse 61   Temp 97.7 F (36.5 C) (Oral)   Ht 6' (1.829 m)   Wt 210 lb (95.3 kg)   SpO2 96%   BMI 28.48 kg/m   BP Readings from Last 3 Encounters:  07/31/19 (!) 144/80  07/22/19 134/68  07/01/19 122/68    Wt Readings from Last 3 Encounters:  07/31/19 210 lb (95.3 kg)  07/22/19 205 lb 9.6 oz (93.3 kg)  07/01/19 209 lb 12.8 oz (95.2 kg)    Physical Exam Constitutional:      General: He is not in acute distress.    Appearance: He is well-developed. He is obese.     Comments: NAD  Eyes:     Conjunctiva/sclera: Conjunctivae normal.     Pupils: Pupils are equal, round, and reactive to light.  Neck:     Thyroid: No thyromegaly.     Vascular: No JVD.  Cardiovascular:     Rate and Rhythm: Normal rate and regular rhythm.     Heart sounds: Normal heart sounds. No murmur. No friction rub. No gallop.   Pulmonary:  Effort: Pulmonary effort is normal. No respiratory distress.     Breath sounds: Normal breath sounds. No wheezing or rales.  Chest:     Chest wall: No tenderness.  Abdominal:     General: Bowel sounds are normal. There is no distension.     Palpations: Abdomen is soft. There is no mass.     Tenderness: There is no abdominal tenderness. There is no guarding or rebound.  Musculoskeletal:        General: No tenderness. Normal range of motion.     Cervical back: Normal range of motion.  Lymphadenopathy:     Cervical: No cervical adenopathy.  Skin:    General: Skin is warm and dry.     Findings: No rash.  Neurological:     Mental Status: He is alert and  oriented to person, place, and time.     Cranial Nerves: No cranial nerve deficit.     Motor: No abnormal muscle tone.     Coordination: Coordination normal.     Gait: Gait normal.     Deep Tendon Reflexes: Reflexes are normal and symmetric.  Psychiatric:        Behavior: Behavior normal.        Thought Content: Thought content normal.        Judgment: Judgment normal.    Dyspneic w/moving around LE w/o edema   Lab Results  Component Value Date   WBC 7.8 05/30/2019   HGB 13.6 05/30/2019   HCT 40.5 05/30/2019   PLT 211.0 05/30/2019   GLUCOSE 135 (H) 07/01/2019   CHOL 161 02/04/2019   TRIG 95 02/04/2019   HDL 45 02/04/2019   LDLDIRECT 122.1 03/05/2012   LDLCALC 98 02/04/2019   ALT 30 07/01/2019   AST 25 07/01/2019   NA 144 07/01/2019   K 4.3 07/01/2019   CL 112 (H) 07/01/2019   CREATININE 0.93 07/01/2019   BUN 20 07/01/2019   CO2 20 07/01/2019   TSH 1.42 05/30/2019   PSA 0.047 04/08/2017   INR 1.26 03/03/2013   HGBA1C 6.0 01/10/2019    No results found.  Assessment & Plan:    Walker Kehr, MD

## 2019-07-31 NOTE — Patient Instructions (Addendum)
    [place] [address] Dept: [phone] Loc: [place]  07/31/2019  You are scheduled for a Cardiac Catheterization on Tuesday, June 8 with Dr. Peter [name].  1. Please arrive at the [facility] (Main Entrance A) at [facility]: [address] [place], [place] [address] at 5:30 AM (This time is two hours before your procedure to ensure your preparation). Free valet parking service is available.   Special note: Every effort is made to have your procedure done on time. Please understand that emergencies sometimes delay scheduled procedures.  2. Diet: Do not eat solid foods after midnight. You may have clear liquids until 5am upon the day of the procedure.  3. Labs: You will need to have blood drawn today: BMET, CBC  4. Medication instructions in preparation for your procedure:  Stop taking, Torsemide (Demadex) Tuesday, June 8,---DO NOT TAKE this or the Olmesartan on the morning of your procedure.  On the morning of your procedure, take your Aspirin and any morning medicines NOT listed above.  You may use sips of water.  5. Plan for one night stay--bring personal belongings. 6. Bring a current list of your medications and current insurance cards. 7. You MUST have a responsible person to drive you home. 8. Someone MUST be with you the first 24 hours after you arrive home or your discharge will be delayed. 9. Please wear clothes that are easy to get on and off and wear slip-on shoes.  Thank you for allowing [place] to care for you!   -- [place] [facility]   COVID TEST ON Friday 08/02/19 AT 1:15 PM AT [place].

## 2019-07-31 NOTE — Assessment & Plan Note (Signed)
Resolved on Torsemide

## 2019-08-01 LAB — CBC
Hematocrit: 41.6 % (ref 37.5–51.0)
Hemoglobin: 13.6 g/dL (ref 13.0–17.7)
MCH: 30.5 pg (ref 26.6–33.0)
MCHC: 32.7 g/dL (ref 31.5–35.7)
MCV: 93 fL (ref 79–97)
Platelets: 220 10*3/uL (ref 150–450)
RBC: 4.46 x10E6/uL (ref 4.14–5.80)
RDW: 12.9 % (ref 11.6–15.4)
WBC: 6.8 10*3/uL (ref 3.4–10.8)

## 2019-08-01 LAB — BASIC METABOLIC PANEL
BUN/Creatinine Ratio: 19 (ref 10–24)
BUN: 21 mg/dL (ref 8–27)
CO2: 23 mmol/L (ref 20–29)
Calcium: 10.4 mg/dL — ABNORMAL HIGH (ref 8.6–10.2)
Chloride: 110 mmol/L — ABNORMAL HIGH (ref 96–106)
Creatinine, Ser: 1.08 mg/dL (ref 0.76–1.27)
GFR calc Af Amer: 76 mL/min/{1.73_m2} (ref >59)
GFR calc non Af Amer: 65 mL/min/{1.73_m2} (ref >59)
Glucose: 133 mg/dL — ABNORMAL HIGH (ref 65–99)
Potassium: 4.8 mmol/L (ref 3.5–5.2)
Sodium: 145 mmol/L — ABNORMAL HIGH (ref 134–144)

## 2019-08-02 ENCOUNTER — Other Ambulatory Visit (HOSPITAL_COMMUNITY)
Admission: RE | Admit: 2019-08-02 | Discharge: 2019-08-02 | Disposition: A | Discharge status: Home / Self Care | Payer: Medicare Other | Source: Ambulatory Visit | Attending: Cardiology | Admitting: Cardiology

## 2019-08-02 DIAGNOSIS — Z01812 Encounter for preprocedural laboratory examination: Secondary | ICD-10-CM | POA: Diagnosis present

## 2019-08-02 DIAGNOSIS — Z20822 Contact with and (suspected) exposure to covid-19: Secondary | ICD-10-CM | POA: Insufficient documentation

## 2019-08-03 LAB — SARS CORONAVIRUS 2 (TAT 6-24 HRS): SARS Coronavirus 2: NEGATIVE

## 2019-08-05 ENCOUNTER — Telehealth: Payer: Self-pay | Admitting: *Deleted

## 2019-08-05 NOTE — Telephone Encounter (Signed)
Pt contacted pre-catheterization scheduled at [facility] for: Tuesday August 06, 2019 7:30 AM Verified arrival time and place: [facility]) at: 5:30 AM   No solid food after midnight prior to cath, clear liquids until 5 AM day of procedure.  Hold: Torsemide-AM of procedure   Except hold medications AM meds can be  taken pre-cath with sip of water including: ASA 81 mg   Confirmed patient has responsible adult to drive home post procedure and observe 24 hours after arriving home: yes  You are allowed ONE visitor in the waiting room during your procedure. Both you and your visitor must wear masks.      COVID-19 Pre-Screening Questions:   In the past 7 to 10 days have you had a cough,  shortness of breath, headache, congestion, fever (100 or greater) body aches, chills, sore throat, or sudden loss of taste or sense of smell? no  Have you been around anyone with known Covid 19 in the past 7 to 10 days? no  Have you been around anyone who is awaiting Covid 19 test results in the past 7 to 10 days? no  Have you been around anyone who has mentioned symptoms of Covid 19 within the past 7 to 10 days? no   Reviewed procedure/mask/visitor instructions, COVID-19 screening questions with patient.

## 2019-08-06 ENCOUNTER — Ambulatory Visit (HOSPITAL_COMMUNITY)
Admission: RE | Admit: 2019-08-06 | Discharge: 2019-08-06 | Disposition: A | Discharge status: Home / Self Care | Payer: Medicare Other | Attending: Cardiology | Admitting: Cardiology

## 2019-08-06 ENCOUNTER — Encounter (HOSPITAL_COMMUNITY)
Admission: RE | Disposition: A | Discharge status: Home / Self Care | Payer: Medicare Other | Source: Home / Self Care | Attending: Cardiology

## 2019-08-06 ENCOUNTER — Other Ambulatory Visit: Payer: Self-pay

## 2019-08-06 DIAGNOSIS — N400 Enlarged prostate without lower urinary tract symptoms: Secondary | ICD-10-CM | POA: Insufficient documentation

## 2019-08-06 DIAGNOSIS — I2584 Coronary atherosclerosis due to calcified coronary lesion: Secondary | ICD-10-CM | POA: Diagnosis not present

## 2019-08-06 DIAGNOSIS — Z885 Allergy status to narcotic agent status: Secondary | ICD-10-CM | POA: Diagnosis not present

## 2019-08-06 DIAGNOSIS — R0600 Dyspnea, unspecified: Secondary | ICD-10-CM | POA: Diagnosis not present

## 2019-08-06 DIAGNOSIS — Z79899 Other long term (current) drug therapy: Secondary | ICD-10-CM | POA: Diagnosis not present

## 2019-08-06 DIAGNOSIS — R0609 Other forms of dyspnea: Secondary | ICD-10-CM | POA: Diagnosis present

## 2019-08-06 DIAGNOSIS — Z7982 Long term (current) use of aspirin: Secondary | ICD-10-CM | POA: Insufficient documentation

## 2019-08-06 DIAGNOSIS — K219 Gastro-esophageal reflux disease without esophagitis: Secondary | ICD-10-CM | POA: Insufficient documentation

## 2019-08-06 DIAGNOSIS — Z888 Allergy status to other drugs, medicaments and biological substances status: Secondary | ICD-10-CM | POA: Insufficient documentation

## 2019-08-06 DIAGNOSIS — R600 Localized edema: Secondary | ICD-10-CM | POA: Insufficient documentation

## 2019-08-06 DIAGNOSIS — Z8249 Family history of ischemic heart disease and other diseases of the circulatory system: Secondary | ICD-10-CM | POA: Diagnosis not present

## 2019-08-06 DIAGNOSIS — I6523 Occlusion and stenosis of bilateral carotid arteries: Secondary | ICD-10-CM | POA: Diagnosis not present

## 2019-08-06 DIAGNOSIS — R609 Edema, unspecified: Secondary | ICD-10-CM | POA: Diagnosis present

## 2019-08-06 DIAGNOSIS — I2510 Atherosclerotic heart disease of native coronary artery without angina pectoris: Secondary | ICD-10-CM | POA: Insufficient documentation

## 2019-08-06 DIAGNOSIS — M1990 Unspecified osteoarthritis, unspecified site: Secondary | ICD-10-CM | POA: Insufficient documentation

## 2019-08-06 DIAGNOSIS — E041 Nontoxic single thyroid nodule: Secondary | ICD-10-CM | POA: Diagnosis not present

## 2019-08-06 DIAGNOSIS — E785 Hyperlipidemia, unspecified: Secondary | ICD-10-CM | POA: Diagnosis present

## 2019-08-06 DIAGNOSIS — I10 Essential (primary) hypertension: Secondary | ICD-10-CM | POA: Diagnosis not present

## 2019-08-06 DIAGNOSIS — Z905 Acquired absence of kidney: Secondary | ICD-10-CM | POA: Diagnosis not present

## 2019-08-06 DIAGNOSIS — Z87891 Personal history of nicotine dependence: Secondary | ICD-10-CM | POA: Diagnosis not present

## 2019-08-06 HISTORY — PX: RIGHT/LEFT HEART CATH AND CORONARY ANGIOGRAPHY: CATH118266

## 2019-08-06 LAB — POCT I-STAT 7, (LYTES, BLD GAS, ICA,H+H)
Acid-base deficit: 2 mmol/L (ref 0.0–2.0)
Bicarbonate: 23.3 mmol/L (ref 20.0–28.0)
Calcium, Ion: 1.48 mmol/L — ABNORMAL HIGH (ref 1.15–1.40)
HCT: 38 % — ABNORMAL LOW (ref 39.0–52.0)
Hemoglobin: 12.9 g/dL — ABNORMAL LOW (ref 13.0–17.0)
O2 Saturation: 98 %
Potassium: 4.2 mmol/L (ref 3.5–5.1)
Sodium: 146 mmol/L — ABNORMAL HIGH (ref 135–145)
TCO2: 25 mmol/L (ref 22–32)
pCO2 arterial: 41.7 mmHg (ref 32.0–48.0)
pH, Arterial: 7.354 (ref 7.350–7.450)
pO2, Arterial: 102 mmHg (ref 83.0–108.0)

## 2019-08-06 LAB — POCT I-STAT EG7
Acid-base deficit: 2 mmol/L (ref 0.0–2.0)
Bicarbonate: 23.9 mmol/L (ref 20.0–28.0)
Calcium, Ion: 1.44 mmol/L — ABNORMAL HIGH (ref 1.15–1.40)
HCT: 37 % — ABNORMAL LOW (ref 39.0–52.0)
Hemoglobin: 12.6 g/dL — ABNORMAL LOW (ref 13.0–17.0)
O2 Saturation: 76 %
Potassium: 4.1 mmol/L (ref 3.5–5.1)
Sodium: 146 mmol/L — ABNORMAL HIGH (ref 135–145)
TCO2: 25 mmol/L (ref 22–32)
pCO2, Ven: 44.4 mmHg (ref 44.0–60.0)
pH, Ven: 7.339 (ref 7.250–7.430)
pO2, Ven: 44 mmHg (ref 32.0–45.0)

## 2019-08-06 LAB — BASIC METABOLIC PANEL
Anion gap: 7 (ref 5–15)
BUN: 23 mg/dL (ref 8–23)
CO2: 24 mmol/L (ref 22–32)
Calcium: 10.3 mg/dL (ref 8.9–10.3)
Chloride: 112 mmol/L — ABNORMAL HIGH (ref 98–111)
Creatinine, Ser: 1.17 mg/dL (ref 0.61–1.24)
GFR calc Af Amer: >60 mL/min (ref >60)
GFR calc non Af Amer: 59 mL/min — ABNORMAL LOW (ref >60)
Glucose, Bld: 111 mg/dL — ABNORMAL HIGH (ref 70–99)
Potassium: 4.3 mmol/L (ref 3.5–5.1)
Sodium: 143 mmol/L (ref 135–145)

## 2019-08-06 SURGERY — RIGHT/LEFT HEART CATH AND CORONARY ANGIOGRAPHY
Anesthesia: LOCAL

## 2019-08-06 MED ORDER — ASPIRIN 81 MG PO CHEW: 81.0000 mg | CHEWABLE_TABLET | ORAL | Status: DC

## 2019-08-06 MED ORDER — SODIUM CHLORIDE 0.9 % WEIGHT BASED INFUSION
3.0000 mL/kg/h | INTRAVENOUS | Status: AC
  Administered 2019-08-06: 3 mL/kg/h via INTRAVENOUS

## 2019-08-06 MED ORDER — VERAPAMIL HCL 2.5 MG/ML IV SOLN
INTRAVENOUS | Status: AC
  Filled 2019-08-06: qty 2

## 2019-08-06 MED ORDER — LIDOCAINE HCL (PF) 1 % IJ SOLN
INTRAMUSCULAR | Status: DC | PRN
  Administered 2019-08-06 (×2): 2 mL via INTRADERMAL

## 2019-08-06 MED ORDER — FENTANYL CITRATE (PF) 100 MCG/2ML IJ SOLN
INTRAMUSCULAR | Status: DC | PRN
  Administered 2019-08-06: 25 ug via INTRAVENOUS

## 2019-08-06 MED ORDER — HEPARIN (PORCINE) IN NACL 1000-0.9 UT/500ML-% IV SOLN
INTRAVENOUS | Status: AC
  Filled 2019-08-06: qty 1000

## 2019-08-06 MED ORDER — SODIUM CHLORIDE 0.9 % WEIGHT BASED INFUSION: 1.0000 mL/kg/h | INTRAVENOUS | Status: DC

## 2019-08-06 MED ORDER — HEPARIN SODIUM (PORCINE) 1000 UNIT/ML IJ SOLN
INTRAMUSCULAR | Status: AC
  Filled 2019-08-06: qty 1

## 2019-08-06 MED ORDER — FENTANYL CITRATE (PF) 100 MCG/2ML IJ SOLN
INTRAMUSCULAR | Status: AC
  Filled 2019-08-06: qty 2

## 2019-08-06 MED ORDER — MIDAZOLAM HCL 2 MG/2ML IJ SOLN
INTRAMUSCULAR | Status: DC | PRN
  Administered 2019-08-06: 1 mg via INTRAVENOUS

## 2019-08-06 MED ORDER — MIDAZOLAM HCL 2 MG/2ML IJ SOLN
INTRAMUSCULAR | Status: AC
  Filled 2019-08-06: qty 2

## 2019-08-06 MED ORDER — SODIUM CHLORIDE 0.9% FLUSH: 3.0000 mL | INTRAVENOUS | Status: DC | PRN

## 2019-08-06 MED ORDER — VERAPAMIL HCL 2.5 MG/ML IV SOLN
INTRAVENOUS | Status: DC | PRN
  Administered 2019-08-06: 10 mL via INTRA_ARTERIAL

## 2019-08-06 MED ORDER — LIDOCAINE HCL (PF) 1 % IJ SOLN
INTRAMUSCULAR | Status: AC
  Filled 2019-08-06: qty 30

## 2019-08-06 MED ORDER — SODIUM CHLORIDE 0.9 % IV SOLN: 250.0000 mL | INTRAVENOUS | Status: DC | PRN

## 2019-08-06 MED ORDER — HEPARIN SODIUM (PORCINE) 1000 UNIT/ML IJ SOLN
INTRAMUSCULAR | Status: DC | PRN
  Administered 2019-08-06: 5000 [IU] via INTRAVENOUS

## 2019-08-06 MED ORDER — SODIUM CHLORIDE 0.9% FLUSH: 3.0000 mL | Freq: Two times a day (BID) | INTRAVENOUS | Status: DC

## 2019-08-06 MED ORDER — IOHEXOL 350 MG/ML SOLN
INTRAVENOUS | Status: DC | PRN
  Administered 2019-08-06: 70 mL via INTRA_ARTERIAL

## 2019-08-06 MED ORDER — HEPARIN (PORCINE) IN NACL 1000-0.9 UT/500ML-% IV SOLN
INTRAVENOUS | Status: DC | PRN
  Administered 2019-08-06 (×2): 500 mL

## 2019-08-06 SURGICAL SUPPLY — 15 items
CATH 5FR JL3.5 JR4 ANG PIG MP (CATHETERS) ×1 IMPLANT
CATH BALLN WEDGE 5F 110CM (CATHETERS) ×1 IMPLANT
CATH INFINITI 5FR JL4 (CATHETERS) ×1 IMPLANT
CATH LAUNCHER 5F RADR (CATHETERS) IMPLANT
CATHETER LAUNCHER 5F RADR (CATHETERS) ×2
DEVICE RAD COMP TR BAND LRG (VASCULAR PRODUCTS) ×1 IMPLANT
GLIDESHEATH SLEND SS 6F .021 (SHEATH) ×1 IMPLANT
GUIDEWIRE .025 260CM (WIRE) ×1 IMPLANT
GUIDEWIRE INQWIRE 1.5J.035X260 (WIRE) IMPLANT
INQWIRE 1.5J .035X260CM (WIRE) ×2
KIT HEART LEFT (KITS) ×2 IMPLANT
PACK CARDIAC CATHETERIZATION (CUSTOM PROCEDURE TRAY) ×2 IMPLANT
SHEATH GLIDE SLENDER 4/5FR (SHEATH) ×1 IMPLANT
TRANSDUCER W/STOPCOCK (MISCELLANEOUS) ×2 IMPLANT
TUBING CIL FLEX 10 FLL-RA (TUBING) ×2 IMPLANT

## 2019-08-06 NOTE — Interval H&P Note (Signed)
History and Physical Interval Note:  08/06/2019 7:09 AM  David [name]  has presented today for surgery, with the diagnosis of dyspnea on exertion.  The various methods of treatment have been discussed with the patient and family. After consideration of risks, benefits and other options for treatment, the patient has consented to  Procedure(s): RIGHT/LEFT HEART CATH AND CORONARY ANGIOGRAPHY (N/A) as a surgical intervention.  The patient's history has been reviewed, patient examined, no change in status, stable for surgery.  I have reviewed the patient's chart and labs.  Questions were answered to the patient's satisfaction.    Cath Lab Visit (complete for each Cath Lab visit)  Clinical Evaluation Leading to the Procedure:   ACS: No.  Non-ACS:    Anginal Classification: CCS III  Anti-ischemic medical therapy: Minimal Therapy (1 class of medications)  Non-Invasive Test Results: No non-invasive testing performed  Prior CABG: No previous CABG       David [name] [facility] 08/06/2019 7:09 AM

## 2019-08-06 NOTE — Discharge Instructions (Signed)
Radial Site Care  This sheet gives you information about how to care for yourself after your procedure. Your health care provider may also give you more specific instructions. If you have problems or questions, contact your health care provider. What can I expect after the procedure? After the procedure, it is common to have:  Bruising and tenderness at the catheter insertion area. Follow these instructions at home: Medicines  Take over-the-counter and prescription medicines only as told by your health care provider. Insertion site care  Follow instructions from your health care provider about how to take care of your insertion site. Make sure you: ? Wash your hands with soap and water before you change your bandage (dressing). If soap and water are not available, use hand sanitizer. ? Change your dressing as told by your health care provider. ? Leave stitches (sutures), skin glue, or adhesive strips in place. These skin closures may need to stay in place for 2 weeks or longer. If adhesive strip edges start to loosen and curl up, you may trim the loose edges. Do not remove adhesive strips completely unless your health care provider tells you to do that.  Check your insertion site every day for signs of infection. Check for: ? Redness, swelling, or pain. ? Fluid or blood. ? Pus or a bad smell. ? Warmth.  Do not take baths, swim, or use a hot tub until your health care provider approves.  You may shower 24-48 hours after the procedure, or as directed by your health care provider. ? Remove the dressing and gently wash the site with plain soap and water. ? Pat the area dry with a clean towel. ? Do not rub the site. That could cause bleeding.  Do not apply powder or lotion to the site. Activity   For 24 hours after the procedure, or as directed by your health care provider: ? Do not flex or bend the affected arm. ? Do not push or pull heavy objects with the affected arm. ? Do not  drive yourself home from the hospital or clinic. You may drive 24 hours after the procedure unless your health care provider tells you not to. ? Do not operate machinery or power tools.  Do not lift anything that is heavier than 10 lb (4.5 kg), or the limit that you are told, until your health care provider says that it is safe.  Ask your health care provider when it is okay to: ? Return to work or school. ? Resume usual physical activities or sports. ? Resume sexual activity. General instructions  If the catheter site starts to bleed, raise your arm and put firm pressure on the site. If the bleeding does not stop, get help right away. This is a medical emergency.  If you went home on the same day as your procedure, a responsible adult should be with you for the first 24 hours after you arrive home.  Keep all follow-up visits as told by your health care provider. This is important. Contact a health care provider if:  You have a fever.  You have redness, swelling, or yellow drainage around your insertion site. Get help right away if:  You have unusual pain at the radial site.  The catheter insertion area swells very fast.  The insertion area is bleeding, and the bleeding does not stop when you hold steady pressure on the area.  Your arm or hand becomes pale, cool, tingly, or numb. These symptoms may represent a serious problem   that is an emergency. Do not wait to see if the symptoms will go away. Get medical help right away. Call your local emergency services (911 in the [place] Do not drive yourself to the hospital. Summary  After the procedure, it is common to have bruising and tenderness at the site.  Follow instructions from your health care provider about how to take care of your radial site wound. Check the wound every day for signs of infection.  Do not lift anything that is heavier than 10 lb (4.5 kg), or the limit that you are told, until your health care provider says  that it is safe. This information is not intended to replace advice given to you by your health care provider. Make sure you discuss any questions you have with your health care provider. Document Revised: 03/22/2017 Document Reviewed: 03/22/2017 Elsevier Patient Education  2020 [employer]  

## 2019-08-15 NOTE — Telephone Encounter (Signed)
Results of cardiac catheterization suggest that patient's shortness of breath is not coming from his heart. He also had a chest CTA in 05/2019 that ruled out PE. So I think the best course of action is to have him see Pulmonology if that is OK with him.   As far as his swelling goes, it is OK for him to continued to use Torsemide as needed for this. Recommend he elevate his legs and try compression stockings as well. It is also important that he limits his salt intake to 000mg  per day.  Also recommend he keep a log of his BP and heart rate for 2 weeks and then send [place] a MyChart message with these readings so we can make adjustments to his medications if needed.   Thank you!

## 2019-08-21 ENCOUNTER — Ambulatory Visit: Payer: Medicare Other | Admitting: Internal Medicine

## 2019-08-21 ENCOUNTER — Encounter: Payer: Self-pay | Admitting: Internal Medicine

## 2019-08-21 ENCOUNTER — Other Ambulatory Visit: Payer: Self-pay

## 2019-08-21 VITALS — BP 148/72 | HR 66 | Temp 98.6°F | Ht 72.0 in | Wt 212.0 lb

## 2019-08-21 DIAGNOSIS — R0600 Dyspnea, unspecified: Secondary | ICD-10-CM

## 2019-08-21 DIAGNOSIS — R0609 Other forms of dyspnea: Secondary | ICD-10-CM

## 2019-08-21 NOTE — Progress Notes (Signed)
Subjective:  Patient ID: David [name], male    DOB: September 02, 1940  Age: 79 y.o. MRN: [id number]  CC: No chief complaint on file.   HPI David [name] presents for DOE  Outpatient Medications Prior to Visit  Medication Sig Dispense Refill  . aspirin EC 81 MG tablet Take 81 mg by mouth every evening.     [name] atorvastatin (LIPITOR) 80 MG tablet Take 1 tablet (80 mg total) by mouth daily at 6 PM. 90 tablet 3  . cetirizine (ZYRTEC) 10 MG tablet Take 1 tablet (10 mg total) by mouth daily. (Patient taking differently: Take 10 mg by mouth daily as needed for allergies. ) 90 tablet 2  . DULoxetine (CYMBALTA) 30 MG capsule TAKE 1 CAPSULE(30 MG) BY MOUTH DAILY (Patient taking differently: Take 30 mg by mouth daily. ) 90 capsule 2  . fluticasone (FLONASE) 50 MCG/ACT nasal spray SHAKE LIQUID AND USE 2 SPRAYS IN EACH NOSTRIL DAILY (Patient taking differently: Place 2 sprays into both nostrils daily as needed for allergies. ) 48 g 1  . Iodoquinol-HC (HYDROCORTISONE-IODOQUINOL) 1-1 % CREA Apply 1 application topically daily as needed (Rash on perineal).   2  . olmesartan (BENICAR) 20 MG tablet Take 1 tablet (20 mg total) by mouth daily. (Patient taking differently: Take 20 mg by mouth at bedtime. ) 90 tablet 3  . pantoprazole (PROTONIX) 40 MG tablet Take 1 tablet (40 mg total) by mouth 2 (two) times daily. 60 tablet 11  . sodium chloride (OCEAN) 0.65 % nasal spray Place 1 spray into the nose daily as needed for congestion.    . torsemide (DEMADEX) 20 MG tablet Take 2 tablets (40 mg total) by mouth daily. (Patient taking differently: Take 20 mg by mouth daily as needed (Swelling). ) 60 tablet 5  . Vitamin D, Cholecalciferol, 50 MCG (2000 UT) CAPS Take 2,000 Units by mouth daily.      No facility-administered medications prior to visit.    ROS: Review of Systems  Constitutional: Negative for appetite change, fatigue and unexpected weight change.  HENT: Negative for congestion, nosebleeds, sneezing,  sore throat and trouble swallowing.   Eyes: Negative for itching and visual disturbance.  Respiratory: Positive for shortness of breath. Negative for cough.   Cardiovascular: Negative for chest pain, palpitations and leg swelling.  Gastrointestinal: Negative for abdominal distention, blood in stool, diarrhea and nausea.  Genitourinary: Negative for frequency and hematuria.  Musculoskeletal: Negative for back pain, gait problem, joint swelling and neck pain.  Skin: Negative for rash.  Neurological: Negative for dizziness, tremors, speech difficulty and weakness.  Psychiatric/Behavioral: Negative for agitation, dysphoric mood and sleep disturbance. The patient is not nervous/anxious.     Objective:  BP (!) 148/72 (BP Location: Right Arm, Patient Position: Sitting, Cuff Size: Normal)   Pulse 66   Temp 98.6 F (37 C) (Oral)   Ht 6' (1.829 m)   Wt 212 lb (96.2 kg)   SpO2 95%   BMI 28.75 kg/m   BP Readings from Last 3 Encounters:  08/21/19 (!) 148/72  08/06/19 121/66  07/31/19 (!) 142/58    Wt Readings from Last 3 Encounters:  08/21/19 212 lb (96.2 kg)  08/06/19 207 lb (93.9 kg)  07/31/19 208 lb 9.6 oz (94.6 kg)    Physical Exam Constitutional:      General: He is not in acute distress.    Appearance: He is well-developed.     Comments: NAD  Eyes:     Conjunctiva/sclera: Conjunctivae normal.  Pupils: Pupils are equal, round, and reactive to light.  Neck:     Thyroid: No thyromegaly.     Vascular: No JVD.  Cardiovascular:     Rate and Rhythm: Normal rate and regular rhythm.     Heart sounds: Normal heart sounds. No murmur heard.  No friction rub. No gallop.   Pulmonary:     Effort: Pulmonary effort is normal. No respiratory distress.     Breath sounds: Normal breath sounds. No wheezing or rales.  Chest:     Chest wall: No tenderness.  Abdominal:     General: Bowel sounds are normal. There is no distension.     Palpations: Abdomen is soft. There is no mass.      Tenderness: There is no abdominal tenderness. There is no guarding or rebound.  Musculoskeletal:        General: No tenderness. Normal range of motion.     Cervical back: Normal range of motion.  Lymphadenopathy:     Cervical: No cervical adenopathy.  Skin:    General: Skin is warm and dry.     Findings: No rash.  Neurological:     Mental Status: He is alert and oriented to person, place, and time.     Cranial Nerves: No cranial nerve deficit.     Motor: No abnormal muscle tone.     Coordination: Coordination normal.     Gait: Gait normal.     Deep Tendon Reflexes: Reflexes are normal and symmetric.  Psychiatric:        Behavior: Behavior normal.        Thought Content: Thought content normal.        Judgment: Judgment normal.    Kyphosis   Lab Results  Component Value Date   WBC 6.8 07/31/2019   HGB 12.6 (L) 08/06/2019   HCT 37.0 (L) 08/06/2019   PLT 220 07/31/2019   GLUCOSE 111 (H) 08/06/2019   CHOL 161 02/04/2019   TRIG 95 02/04/2019   HDL 45 02/04/2019   LDLDIRECT 122.1 03/05/2012   LDLCALC 98 02/04/2019   ALT 30 07/01/2019   AST 25 07/01/2019   NA 146 (H) 08/06/2019   K 4.1 08/06/2019   CL 112 (H) 08/06/2019   CREATININE 1.17 08/06/2019   BUN 23 08/06/2019   CO2 24 08/06/2019   TSH 1.42 05/30/2019   PSA 0.047 04/08/2017   INR 1.26 03/03/2013   HGBA1C 6.0 01/10/2019    No results found.  Assessment & Plan:    Walker Kehr, MD

## 2019-08-24 ENCOUNTER — Encounter: Payer: Self-pay | Admitting: Internal Medicine

## 2019-08-24 NOTE — Assessment & Plan Note (Signed)
Not better All cardiac tests were negative - reviewed  Ref to Dr Melvyn Novas

## 2019-09-12 ENCOUNTER — Other Ambulatory Visit: Payer: Self-pay

## 2019-09-12 ENCOUNTER — Encounter: Payer: Self-pay | Admitting: Internal Medicine

## 2019-09-12 ENCOUNTER — Ambulatory Visit: Payer: Medicare Other | Admitting: Internal Medicine

## 2019-09-12 DIAGNOSIS — R0609 Other forms of dyspnea: Secondary | ICD-10-CM

## 2019-09-12 DIAGNOSIS — R0600 Dyspnea, unspecified: Secondary | ICD-10-CM | POA: Diagnosis not present

## 2019-09-12 NOTE — Patient Instructions (Signed)
GERD (REFLUX)  is an extremely common cause of respiratory symptoms just like yours , many times with no obvious heartburn at all.    It can be treated with medication, but also with lifestyle changes including elevation of the head of your bed (ideally with 6-8inch blocks under the headboard of your bed),  Smoking cessation, avoidance of late meals, excessive alcohol, and avoid fatty foods, chocolate, peppermint, colas, red wine, and acidic juices such as orange juice.  NO MINT OR MENTHOL PRODUCTS SO NO COUGH DROPS  USE SUGARLESS CANDY INSTEAD (Jolley ranchers or Stover's or Life Savers) or even ice chips will also do - the key is to swallow to prevent all throat clearing. NO OIL BASED VITAMINS - use powdered substitutes.  Avoid fish oil when coughing.   To get the most out of exercise, you need to be continuously aware that you are short of breath, but never out of breath, for 30 minutes daily. As you improve, it will actually be easier for you to do the same amount of exercise  in  30 minutes so always push to the level where you are short of breath.   If not gradually improving over the next 4 weeks the next step is CPST - call me to schedule

## 2019-09-12 NOTE — Progress Notes (Signed)
[name], male    DOB: 09/18/1940      MRN: [id number]   Brief patient profile:  36 yowm quit smoking 1983 with good ex tolerance until first of 2021 and gradually worse to point where has trouble finishing a grocery store so w/u by cards/ Plotnikov neg so referred to pulmonary clinic 09/12/2019 by Dr   Alain Marion     History of Present Illness  09/12/2019  Pulmonary/ 1st office eval/Janeene Sand  Chief Complaint  Patient presents with  . Consult    shortness of breath with exertion, non productive cough  Dyspnea: couple of aisles  HT  Cough: dry cough x sev years / worse p eating Sleep: able to lie  SABA use: none  Already on ppi bid   No obvious day to day or daytime variability or assoc excess/ purulent sputum or mucus plugs or hemoptysis or cp or chest tightness, subjective wheeze or overt sinus or hb symptoms.   Sleeping  without nocturnal  or early am exacerbation  of respiratory  c/o's or need for noct saba. Also denies any obvious fluctuation of symptoms with weather or environmental changes or other aggravating or alleviating factors except as outlined above   No unusual exposure hx or h/o childhood pna/ asthma or knowledge of premature birth.  Current Allergies, Complete Past Medical History, Past Surgical History, Family History, and Social History were reviewed in [employer] record.  ROS  The following are not active complaints unless bolded Hoarseness, sore throat, dysphagia, dental problems, itching, sneezing,  nasal congestion or discharge of excess mucus or purulent secretions, ear ache,   fever, chills, sweats, unintended wt loss or wt gain, classically pleuritic or exertional cp,  orthopnea pnd or arm/hand swelling  or leg swelling ankles, presyncope, palpitations, abdominal pain, anorexia, nausea, vomiting, diarrhea  or change in bowel habits or change in bladder habits, change in stools or change in urine, dysuria, hematuria,  rash,  arthralgias, visual complaints, headache, numbness, weakness or ataxia or problems with walking or coordination,  change in mood or  memory.           Past Medical History:  Diagnosis Date  . Allergy   . Arthritis    gen.  and left hip  . BPH (benign prostatic hypertrophy)   . GERD (gastroesophageal reflux disease)    occasional  . History of kidney stones    2007  . Hyperlipidemia   . Hypertension   . Kidney tumor   . LBP (low back pain) 06/2005   L5 radicular symptoms; MRI of LS spine severe spondylosis at L4-L5 with central cancal stenosis   . Nocturia   . Organic impotence   . Prostate cancer ([place]) 10/2011   (Low grade) [facility] - Dr. Junious Silk  . Pyelonephritis 03/05/2013  . Renal oncocytoma of right kidney 01/23/2013   As 3.2 x 3.5 cm enhancing exophytic mass projecting off the lower pole. This is consistent with a renal cell carcinoma. No retroperitoneal lymphadenopathy.   [name] Spermatocele   . 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 Wears glasses     Outpatient Medications Prior to Visit  Medication Sig Dispense Refill  . aspirin EC 81 MG tablet Take 81 mg by mouth every evening.     [name] atorvastatin (LIPITOR) 80 MG tablet Take 1 tablet (80 mg total) by mouth daily at 6 PM. 90 tablet 3  . cetirizine (ZYRTEC) 10 MG tablet Take 1 tablet (10 mg total) by mouth daily. (Patient taking  differently: Take 10 mg by mouth daily as needed for allergies. ) 90 tablet 2  . DULoxetine (CYMBALTA) 30 MG capsule TAKE 1 CAPSULE(30 MG) BY MOUTH DAILY (Patient taking differently: Take 30 mg by mouth daily. ) 90 capsule 2  . fluticasone (FLONASE) 50 MCG/ACT nasal spray SHAKE LIQUID AND USE 2 SPRAYS IN EACH NOSTRIL DAILY (Patient taking differently: Place 2 sprays into both nostrils daily as needed for allergies. ) 48 g 1  . Iodoquinol-HC (HYDROCORTISONE-IODOQUINOL) 1-1 % CREA Apply 1 application topically daily as needed (Rash on perineal).   2  . olmesartan (BENICAR) 20 MG tablet Take 1 tablet (20 mg total) by  mouth daily. (Patient taking differently: Take 20 mg by mouth at bedtime. ) 90 tablet 3  . pantoprazole (PROTONIX) 40 MG tablet Take 1 tablet (40 mg total) by mouth 2 (two) times daily. 60 tablet 11  . sodium chloride (OCEAN) 0.65 % nasal spray Place 1 spray into the nose daily as needed for congestion.    . torsemide (DEMADEX) 20 MG tablet Take 2 tablets (40 mg total) by mouth daily. (Patient taking differently: Take 20 mg by mouth daily as needed (Swelling). ) 60 tablet 5  . Vitamin D, Cholecalciferol, 50 MCG (2000 UT) CAPS Take 2,000 Units by mouth daily.      No facility-administered medications prior to visit.     Objective:     BP 124/60 (BP Location: Left Arm, Cuff Size: Normal)   Pulse 74   Temp 98.4 F (36.9 C) (Oral)   Ht 6' (1.829 m)   Wt 204 lb (92.5 kg)   SpO2 99% Comment: room air  BMI 27.67 kg/m   SpO2: 99 % (room air)   amb wm with raspy voice    HEENT : pt wearing mask not removed for exam due to covid -19 concerns.    NECK :  without JVD/Nodes/TM/ nl carotid upstrokes bilaterally   LUNGS: no acc muscle use,  Nl contour chest which is clear to A and P bilaterally without cough on insp or exp maneuvers   CV:  RRR  no s3 or murmur or increase in P2, and no edema   ABD:  soft and nontender with nl inspiratory excursion in the supine position. No bruits or organomegaly appreciated, bowel sounds nl  MS:  Nl gait/ ext warm without deformities, calf tenderness, cyanosis or clubbing No obvious joint restrictions   SKIN: warm and dry without lesions    NEURO:  alert, approp, nl sensorium with  no motor or cerebellar deficits apparent.       I personally reviewed images and agree with radiology impression as follows:   Chest CTa 06/07/19  1. No evidence of acute pulmonary embolism or other acute chest process. 2. Enlarging hiatal hernia. 3. Stable incidental findings including a probable proximal esophageal diverticulum, nonobstructing left renal  calculus and Cyst.   Labs ordered/ reviewed:      Chemistry      Component Value Date/Time   NA 146 (H) 08/06/2019 0755   NA 145 (H) 07/31/2019 1613   K 4.1 08/06/2019 0755   CL 112 (H) 08/06/2019 0611   CO2 24 08/06/2019 0611   BUN 23 08/06/2019 0611   BUN 21 07/31/2019 1613   CREATININE 1.17 08/06/2019 0611      Component Value Date/Time   CALCIUM 10.3 08/06/2019 0611   ALKPHOS 158 (H) 07/01/2019 1439   AST 25 07/01/2019 1439   ALT 30 07/01/2019 1439   BILITOT  0.3 07/01/2019 1439        Lab Results  Component Value Date   WBC 6.8 07/31/2019   HGB 12.6 (L) 08/06/2019   HCT 37.0 (L) 08/06/2019   MCV 93 07/31/2019   PLT 220 07/31/2019     Lab Results  Component Value Date   DDIMER 0.58 (H) 05/30/2019      Lab Results  Component Value Date   TSH 1.42 05/30/2019     Lab Results  Component Value Date   PROBNP 96 07/01/2019       Lab Results  Component Value Date   ESRSEDRATE 35 (H) 06/20/2019          Assessment   DOE (dyspnea on exertion) Onset early 2021  Cardiac ECHO - CTa 06/07/19 1. No evidence of acute pulmonary embolism or other acute chest process. 2. Enlarging hiatal hernia. - PFT's 07/25/2019  FEV1 2.87 (89 % ) ratio 0.71    p 0 prior to study with DCO  19.24 (73%) corrects to3.21 (82%)  for alv volume and FV curve effort dep portion of exp f/v with non-physiologic truncation  (like a bite taken out of it s plateau)  08/06/19 LHC/ RHC nl  -  09/12/2019   Walked RA  4 laps @ approx 272ft each @ fast pace  stopped due to end of study, light headed >  sob and sats still 100%    Symptoms are markedly disproportionate to objective findings and not clear to what extent this is actually a pulmonary  problem but pt does appear to have difficult to sort out respiratory symptoms of unknown origin for which  DDX  = almost all start with A and  include Adherence, Ace Inhibitors, Acid Reflux, Active Sinus Disease, Alpha 1 Antitripsin deficiency, Anxiety  masquerading as Airways dz,  ABPA,  Allergy(esp in young), Aspiration (esp in elderly), Adverse effects of meds,  Active smoking or Vaping, A bunch of PE's/clot burden (a few small clots can't cause this syndrome unless there is already severe underlying pulm or vascular dz with poor reserve),  Anemia or thyroid disorder, plus two Bs  = Bronchiectasis and Beta blocker use..and one C= CHF     Adherence is always the initial "prime suspect" and is a multilayered concern that requires a "trust but verify" approach in every patient - starting with knowing how to use medications, especially inhalers, correctly, keeping up with refills and understanding the fundamental difference between maintenance and prns vs those medications only taken for a very short course and then stopped and not refilled.  - appears to be taking meds as rec   ? Acid (or non-acid) GERD > always difficult to exclude as up to 75% of pts in some series report no assoc GI/ Heartburn symptoms and he has assoc hoarseness and abn exp flow volume curve > rec max (24h)  acid suppression and diet restrictions/ reviewed and instructions given in writing. - if hoarseness not improved strongly rec ENT eval   ? Anxiety/depression/ deconditioning  >  usually at the bottom of this list of usual suspects but should be   higher on this pt's based on H and P and note already on psychotropics and may interfere with adherence and also interpretation of response or lack thereof to symptom management which can be quite subjective.   ? Allergy/ asthma > nothing to suggest   ? Advserse effects of meds > note on very high doses of statins but denies muscle aches with ex  ?  A bunch of PE"s > D dimer nl - while a normal  or high normal value (seen commonly in the elderly or chronically ill)  may miss small peripheral pe, the clot burden with sob is moderately high and the d dimer  has a very high neg pred value if used in this setting/ also neg CTa and nl R ht  pressures noted  ? chf > excluded by RHC/LHC   >>> rec max ger rx/ reconditioning then CPST p 4 weeks if not improving    .       Each maintenance medication was reviewed in detail including emphasizing most importantly the difference between maintenance and prns and under what circumstances the prns are to be triggered using an action plan format where appropriate.  Total time for H and P, chart review, counseling,  directly observing portions of ambulatory 02 saturation study/  and generating customized AVS unique to this office visit / charting = 45 min           Christinia Gully, MD 09/12/2019

## 2019-09-13 ENCOUNTER — Encounter: Payer: Self-pay | Admitting: Internal Medicine

## 2019-09-13 NOTE — Assessment & Plan Note (Addendum)
Onset early 2021  Cardiac ECHO - CTa 06/07/19 1. No evidence of acute pulmonary embolism or other acute chest process. 2. Enlarging hiatal hernia. - PFT's 07/25/2019  FEV1 2.87 (89 % ) ratio 0.71    p 0 prior to study with DCO  19.24 (73%) corrects to3.21 (82%)  for alv volume and FV curve effort dep portion of exp f/v with non-physiologic truncation  (like a bite taken out of it s plateau)  08/06/19 LHC/ RHC nl  -  09/12/2019   Walked RA  4 laps @ approx 246ft each @ fast pace  stopped due to end of study, light headed >  sob and sats still 100%    Symptoms are markedly disproportionate to objective findings and not clear to what extent this is actually a pulmonary  problem but pt does appear to have difficult to sort out respiratory symptoms of unknown origin for which  DDX  = almost all start with A and  include Adherence, Ace Inhibitors, Acid Reflux, Active Sinus Disease, Alpha 1 Antitripsin deficiency, Anxiety masquerading as Airways dz,  ABPA,  Allergy(esp in young), Aspiration (esp in elderly), Adverse effects of meds,  Active smoking or Vaping, A bunch of PE's/clot burden (a few small clots can't cause this syndrome unless there is already severe underlying pulm or vascular dz with poor reserve),  Anemia or thyroid disorder, plus two Bs  = Bronchiectasis and Beta blocker use..and one C= CHF     Adherence is always the initial "prime suspect" and is a multilayered concern that requires a "trust but verify" approach in every patient - starting with knowing how to use medications, especially inhalers, correctly, keeping up with refills and understanding the fundamental difference between maintenance and prns vs those medications only taken for a very short course and then stopped and not refilled.  - appears to be taking meds as rec   ? Acid (or non-acid) GERD > always difficult to exclude as up to 75% of pts in some series report no assoc GI/ Heartburn symptoms and he has assoc hoarseness and abn exp  flow volume curve > rec max (24h)  acid suppression and diet restrictions/ reviewed and instructions given in writing. - if hoarseness not improved strongly rec ENT eval   ? Anxiety/depression/ deconditioning  >  usually at the bottom of this list of usual suspects but should be   higher on this pt's based on H and P and note already on psychotropics and may interfere with adherence and also interpretation of response or lack thereof to symptom management which can be quite subjective.   ? Allergy/ asthma > nothing to suggest   ? Advserse effects of meds > note on very high doses of statins but denies muscle aches with ex  ? A bunch of PE"s > D dimer nl - while a normal  or high normal value (seen commonly in the elderly or chronically ill)  may miss small peripheral pe, the clot burden with sob is moderately high and the d dimer  has a very high neg pred value if used in this setting/ also neg CTa and nl R ht pressures noted  ? chf > excluded by RHC/LHC   >>> rec max ger rx/ reconditioning then CPST p 4 weeks if not improving    .       Each maintenance medication was reviewed in detail including emphasizing most importantly the difference between maintenance and prns and under what circumstances the prns are  to be triggered using an action plan format where appropriate.  Total time for H and P, chart review, counseling,  directly observing portions of ambulatory 02 saturation study/  and generating customized AVS unique to this office visit / charting = 45 min

## 2019-09-16 NOTE — Progress Notes (Signed)
HPI: FU dyspnea. Abdominal CT December 2015 showed no aneurysm but there was note of coronary calcification.Carotid Dopplers December 2020 showed 40 to 59% right and 1 to 39% left stenosis. There was note of a right thyroid nodule.Follow-up thyroid ultrasound December 2020 showed right mid thyroid nodule and right inferior thyroid nodule and follow-up recommended in 1 year.Lower extremity venous Dopplers April 2021 showed no DVT. Echocardiogram April 2021 showed normal LV systolic function, grade 1 diastolic dysfunction, mild left atrial enlargement and mildly dilated aortic root measuring 42 to 43 mm. CTA April 2021 showed no pulmonary embolus; atherosclerosis of great vessels, aorta and coronary arteries noted. Abdominal ultrasound April 2021 unrevealing. Laboratories April 2021 showed BNP 24, TSH 1.42, BUN 25 and creatinine 0.99, hemoglobin 13.6, normal urinalysis, and normal liver functions with exception of mildly elevated alkaline phosphatase 143. PFTs 5/21 showed mild obstruction. Cardiac cath 6/21 showed mild nonobstructive CAD, normal right heart pressures, low LV filling pressure and no shunt. Seen by pulmonary and etiology of dyspnea unclear. Since last seenhe continues to have some dyspnea on exertion though slightly improved.  No orthopnea, PND, pedal edema, chest pain or syncope.  Current Outpatient Medications  Medication Sig Dispense Refill  . aspirin EC 81 MG tablet Take 81 mg by mouth every evening.     [name] atorvastatin (LIPITOR) 80 MG tablet Take 1 tablet (80 mg total) by mouth daily at 6 PM. 90 tablet 3  . cetirizine (ZYRTEC) 10 MG tablet Take 1 tablet (10 mg total) by mouth daily. 90 tablet 2  . DULoxetine (CYMBALTA) 30 MG capsule TAKE 1 CAPSULE(30 MG) BY MOUTH DAILY 90 capsule 2  . fluticasone (FLONASE) 50 MCG/ACT nasal spray SHAKE LIQUID AND USE 2 SPRAYS IN EACH NOSTRIL DAILY 48 g 1  . Iodoquinol-HC (HYDROCORTISONE-IODOQUINOL) 1-1 % CREA Apply 1 application topically  daily as needed (Rash on perineal).   2  . olmesartan (BENICAR) 20 MG tablet Take 1 tablet (20 mg total) by mouth daily. (Patient taking differently: Take 20 mg by mouth at bedtime. ) 90 tablet 3  . pantoprazole (PROTONIX) 40 MG tablet Take 1 tablet (40 mg total) by mouth 2 (two) times daily. 60 tablet 11  . sodium chloride (OCEAN) 0.65 % nasal spray Place 1 spray into the nose daily as needed for congestion.    . torsemide (DEMADEX) 20 MG tablet Take 2 tablets (40 mg total) by mouth daily. (Patient taking differently: Take 20 mg by mouth daily as needed (Swelling). ) 60 tablet 5  . Vitamin D, Cholecalciferol, 50 MCG (2000 UT) CAPS Take 2,000 Units by mouth daily.      No current facility-administered medications for this visit.     Past Medical History:  Diagnosis Date  . Allergy   . Arthritis    gen.  and left hip  . BPH (benign prostatic hypertrophy)   . GERD (gastroesophageal reflux disease)    occasional  . History of kidney stones    2007  . Hyperlipidemia   . Hypertension   . Kidney tumor   . LBP (low back pain) 06/2005   L5 radicular symptoms; MRI of LS spine severe spondylosis at L4-L5 with central cancal stenosis   . Nocturia   . Organic impotence   . Prostate cancer ([place]) 10/2011   (Low grade) [facility] - Dr. Junious Silk  . Pyelonephritis 03/05/2013  . Renal oncocytoma of right kidney 01/23/2013   As 3.2 x 3.5 cm enhancing exophytic mass projecting off the  lower pole. This is consistent with a renal cell carcinoma. No retroperitoneal lymphadenopathy.   [name] Spermatocele   . 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 Wears glasses     Past Surgical History:  Procedure Laterality Date  . COLONOSCOPY  02/15/2006, 2014   Internal hemorrhoids (Dr [name]), last in 2014  . INGUINAL HERNIA REPAIR Right 1980  . LYMPHADENECTOMY Bilateral 02/15/2013   Procedure: LYMPHADENECTOMY WITH INDOCYANINE GREEN DYE INJECTION;  Surgeon: [name], MD;  Location: [facility];  Service: Urology;  Laterality:  Bilateral;  . PROSTATE BIOPSY N/A 12/18/2012   Procedure: BIOPSY TRANSRECTAL ULTRASONIC PROSTATE (TUBP);  Surgeon: [name], MD;  Location: [facility];  Service: Urology;  Laterality: N/A;  . PROSTATE BIOPSY  12/05/11   gleason 6, 3/12 cores  . REPAIR UMBILICAL AND VENTRAL HERNIA'S W/ MESH  08-21-2009  . RIGHT/LEFT HEART CATH AND CORONARY ANGIOGRAPHY N/A 08/06/2019   Procedure: RIGHT/LEFT HEART CATH AND CORONARY ANGIOGRAPHY;  Surgeon: [name], Peter M, MD;  Location: [facility];  Service: Cardiovascular;  Laterality: N/A;  . ROBOT ASSISTED LAPAROSCOPIC RADICAL PROSTATECTOMY N/A 02/15/2013   Procedure: ROBOTIC ASSISTED LAPAROSCOPIC RADICAL PROSTATECTOMY;  Surgeon: [name], MD;  Location: [facility];  Service: Urology;  Laterality: N/A;  . ROBOTIC ASSITED PARTIAL NEPHRECTOMY Right 02/15/2013   Procedure: ROBOTIC ASSITED PARTIAL NEPHRECTOMY;  Surgeon: [name], MD;  Location: [facility];  Service: Urology;  Laterality: Right;  . SPERMATOCELECTOMY Left 12/18/2012   Procedure: LEFT SPERMATOCELECTOMY;  Surgeon: [name], MD;  Location: [facility];  Service: Urology;  Laterality: Left;  . TONSILLECTOMY AND ADENOIDECTOMY  as child  . TRANSURETHRAL RESECTION OF PROSTATE  AGE 44    Social History   Socioeconomic History  . Marital status: Married    Spouse name: Not on file  . Number of children: 2  . Years of education: Not on file  . Highest education level: Not on file  Occupational History  . Not on file  Tobacco Use  . Smoking status: Former Smoker    Packs/day: 0.25    Years: 10.00    Pack years: 2.50    Types: Cigarettes    Quit date: 02/28/1981    Years since quitting: 38.5  . Smokeless tobacco: Never Used  Vaping Use  . Vaping Use: Never used  Substance and Sexual Activity  . Alcohol use: Yes    Alcohol/week: 1.0 standard drink    Types: 1 Glasses of wine per week  . Drug use: Not Currently    Types:  Flunitrazepam  . Sexual activity: Not on file  Other Topics Concern  . Not on file  Social History Narrative   Married- 51 yrs   2 daughters   5 grandchildren   Never Smoked   Alcohol use-yes (occasional/social)   Retired- Engineer, water      Physician roster:      Urologist - Dr. [name]   Orthopedics - Dr. [name]   Dermatology - Dr. [name]   Social Determinants of Health   Financial Resource Strain:   . Difficulty of Paying Living Expenses:   Food Insecurity:   . Worried About Charity fundraiser in the Last Year:   . Arboriculturist in the Last Year:   Transportation Needs:   . Film/video editor (Medical):   [name] Lack of Transportation (Non-Medical):   Physical Activity:   . Days of Exercise per Week:   . Minutes of Exercise per Session:  Stress:   . Feeling of Stress :   Social Connections:   . Frequency of Communication with Friends and Family:   . Frequency of Social Gatherings with Friends and Family:   . Attends Religious Services:   . Active Member of Clubs or Organizations:   . Attends Archivist Meetings:   [name] Marital Status:   Intimate Partner Violence:   . Fear of Current or Ex-Partner:   . Emotionally Abused:   [name] Physically Abused:   . Sexually Abused:     Family History  Problem Relation Age of Onset  . Cancer Mother        colon  . Colon cancer Mother 76  . Pulmonary fibrosis Father        father owned a Radio broadcast assistant (father blamed inhalation of chemicals and fumes)  . Pulmonary fibrosis Brother   . Coronary artery disease Other   . Hyperlipidemia Other   . Colon polyps Neg Hx   . Esophageal cancer Neg Hx   . Rectal cancer Neg Hx   . Stomach cancer Neg Hx     ROS: no fevers or chills, productive cough, hemoptysis, dysphasia, odynophagia, melena, hematochezia, dysuria, hematuria, rash, seizure activity, orthopnea, PND, pedal edema, claudication. Remaining systems are negative.  Physical Exam: Well-developed  well-nourished in no acute distress.  Skin is warm and dry.  HEENT is normal.  Neck is supple.  Chest is clear to auscultation with normal expansion.  Cardiovascular exam is regular rate and rhythm.  Abdominal exam nontender or distended. No masses palpated. Extremities show no edema. neuro grossly intact  A/P  1 dyspnea-etiology remains unclear; no cardiac etiology identified (see results of chest CTA, echo and right and left cath); fu pulmonary. Note PFTs with mild obstruction and apparently did not desat with ambulation.  There is some thought that reflux may be contributing and pulmonary is managing.  2 Coronary calcification-no CP; continue asa and statin.  3 hypertension-BP controlled; will remain off amlodipine as this could have contributed to lower ext edema.  4 hyperlipidemia-continue statin.  5 carotid artery disease-fu dopplers 12/21.  6 thyroid nodule-fu primary care.  [name], MD

## 2019-09-23 ENCOUNTER — Other Ambulatory Visit: Payer: Self-pay

## 2019-09-23 ENCOUNTER — Encounter: Payer: Self-pay | Admitting: Cardiology

## 2019-09-23 ENCOUNTER — Ambulatory Visit: Payer: Medicare Other | Admitting: Cardiology

## 2019-09-23 VITALS — BP 118/60 | HR 69 | Ht 72.0 in | Wt 204.2 lb

## 2019-09-23 DIAGNOSIS — R600 Localized edema: Secondary | ICD-10-CM | POA: Diagnosis not present

## 2019-09-23 DIAGNOSIS — I2584 Coronary atherosclerosis due to calcified coronary lesion: Secondary | ICD-10-CM

## 2019-09-23 DIAGNOSIS — I2510 Atherosclerotic heart disease of native coronary artery without angina pectoris: Secondary | ICD-10-CM

## 2019-09-23 DIAGNOSIS — R0609 Other forms of dyspnea: Secondary | ICD-10-CM

## 2019-09-23 DIAGNOSIS — I10 Essential (primary) hypertension: Secondary | ICD-10-CM | POA: Diagnosis not present

## 2019-09-23 DIAGNOSIS — R0600 Dyspnea, unspecified: Secondary | ICD-10-CM

## 2019-09-23 DIAGNOSIS — E785 Hyperlipidemia, unspecified: Secondary | ICD-10-CM

## 2019-09-23 NOTE — Patient Instructions (Signed)

## 2019-10-14 ENCOUNTER — Ambulatory Visit: Payer: Medicare Other | Admitting: Internal Medicine

## 2019-10-29 ENCOUNTER — Other Ambulatory Visit: Payer: Self-pay | Admitting: Family Medicine

## 2019-10-29 DIAGNOSIS — J309 Allergic rhinitis, unspecified: Secondary | ICD-10-CM

## 2019-11-06 ENCOUNTER — Other Ambulatory Visit: Payer: Self-pay | Admitting: Cardiology

## 2019-11-06 DIAGNOSIS — E7800 Pure hypercholesterolemia, unspecified: Secondary | ICD-10-CM

## 2019-12-02 ENCOUNTER — Other Ambulatory Visit: Payer: Self-pay | Admitting: Internal Medicine

## 2020-01-14 ENCOUNTER — Other Ambulatory Visit: Payer: Self-pay | Admitting: Internal Medicine

## 2020-01-28 ENCOUNTER — Encounter: Payer: Self-pay | Admitting: *Deleted

## 2020-02-03 ENCOUNTER — Other Ambulatory Visit: Payer: Self-pay | Admitting: Family Medicine

## 2020-02-03 DIAGNOSIS — J309 Allergic rhinitis, unspecified: Secondary | ICD-10-CM

## 2020-02-04 ENCOUNTER — Telehealth: Payer: Self-pay | Admitting: Internal Medicine

## 2020-02-04 DIAGNOSIS — E041 Nontoxic single thyroid nodule: Secondary | ICD-10-CM

## 2020-02-04 NOTE — Telephone Encounter (Signed)
Patient was told by his cardiologist that he needs to have a ultrasound of his thyroid done this month. The nurse advised him to call our office to get that scheduled for him.   Are you able to put in a referral for the patient?

## 2020-02-05 NOTE — Telephone Encounter (Signed)
Okay.  Orders placed.  Thanks

## 2020-02-13 ENCOUNTER — Other Ambulatory Visit: Payer: Self-pay | Admitting: Cardiology

## 2020-02-13 DIAGNOSIS — E7800 Pure hypercholesterolemia, unspecified: Secondary | ICD-10-CM

## 2020-02-19 IMAGING — DX DG LUMBAR SPINE COMPLETE 4+V
5 series · 5 of 5 positions shown · non-contrast
Comparison: Abdomen and pelvis CT dated 08/14/2017.

CLINICAL DATA: Chronic low back pain.  No known injury.

EXAM:
LUMBAR SPINE - COMPLETE 4+ VIEW

[lumbar spine ap]
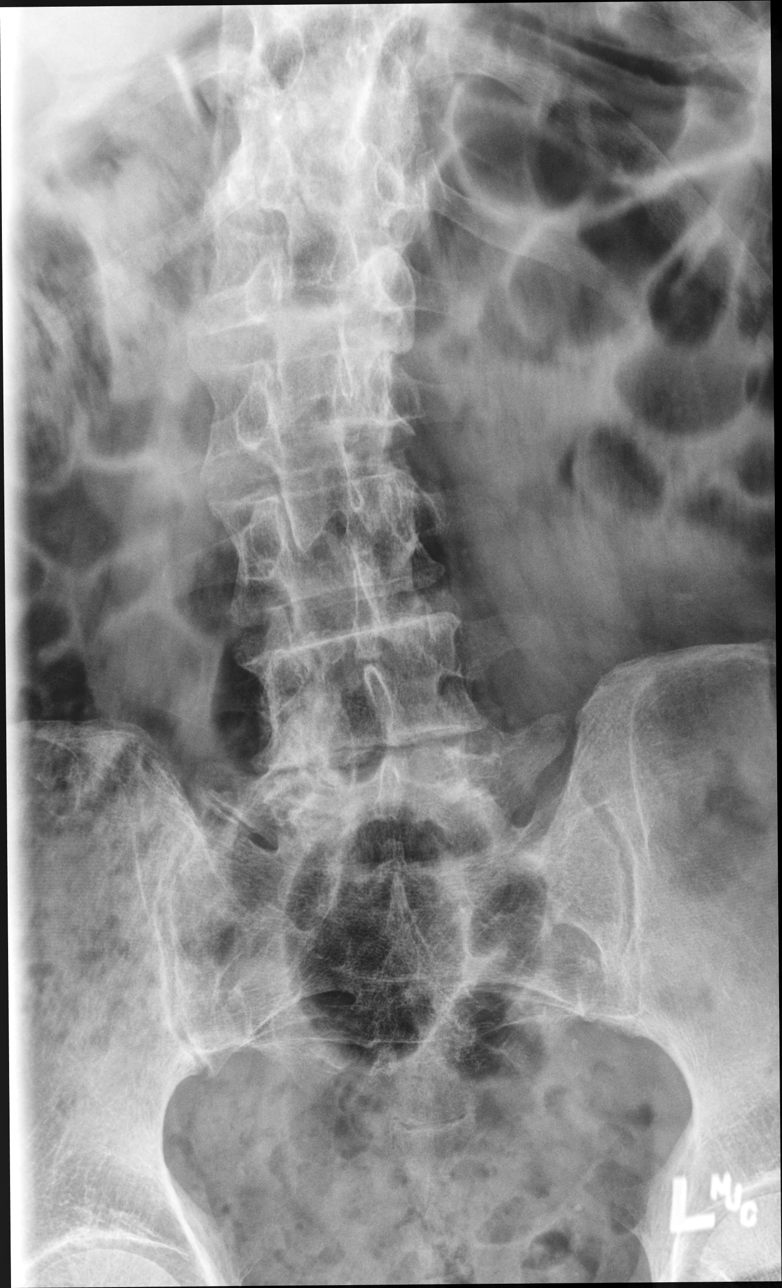

[lumbar spine oblique (1 of 2)]
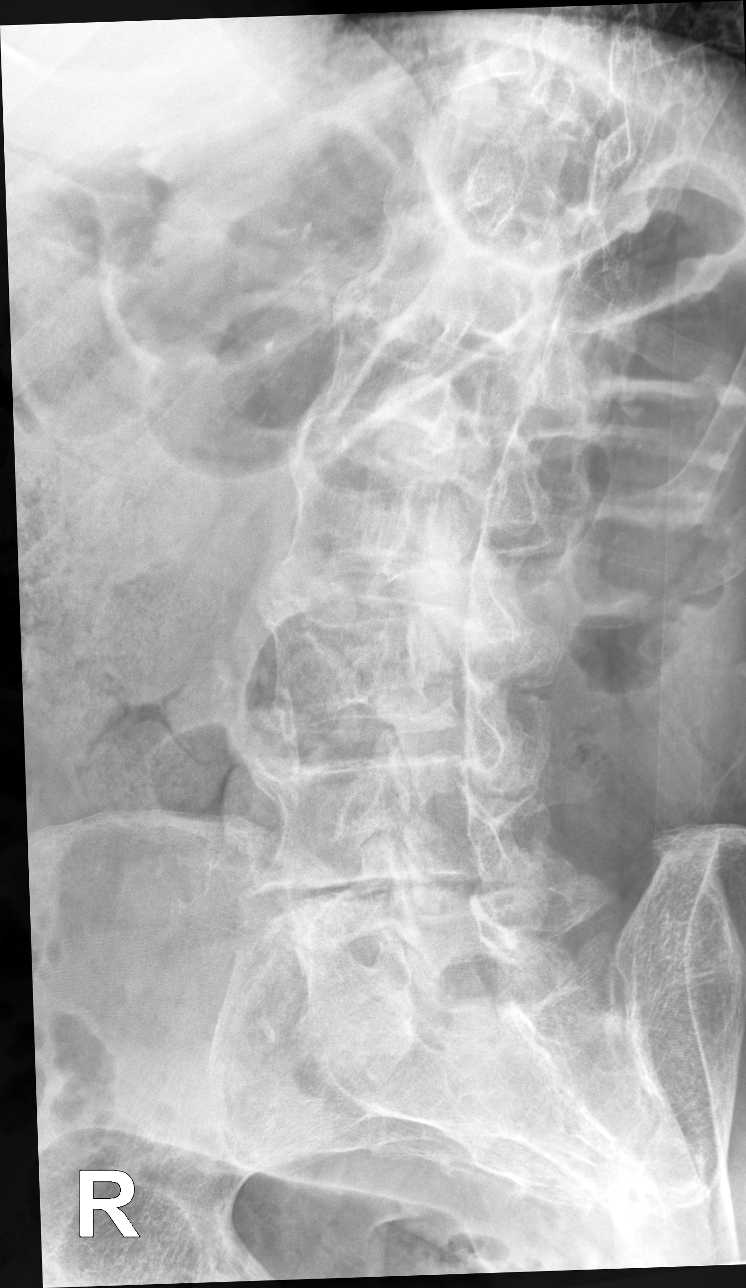

[lumbar spine oblique (2 of 2)]
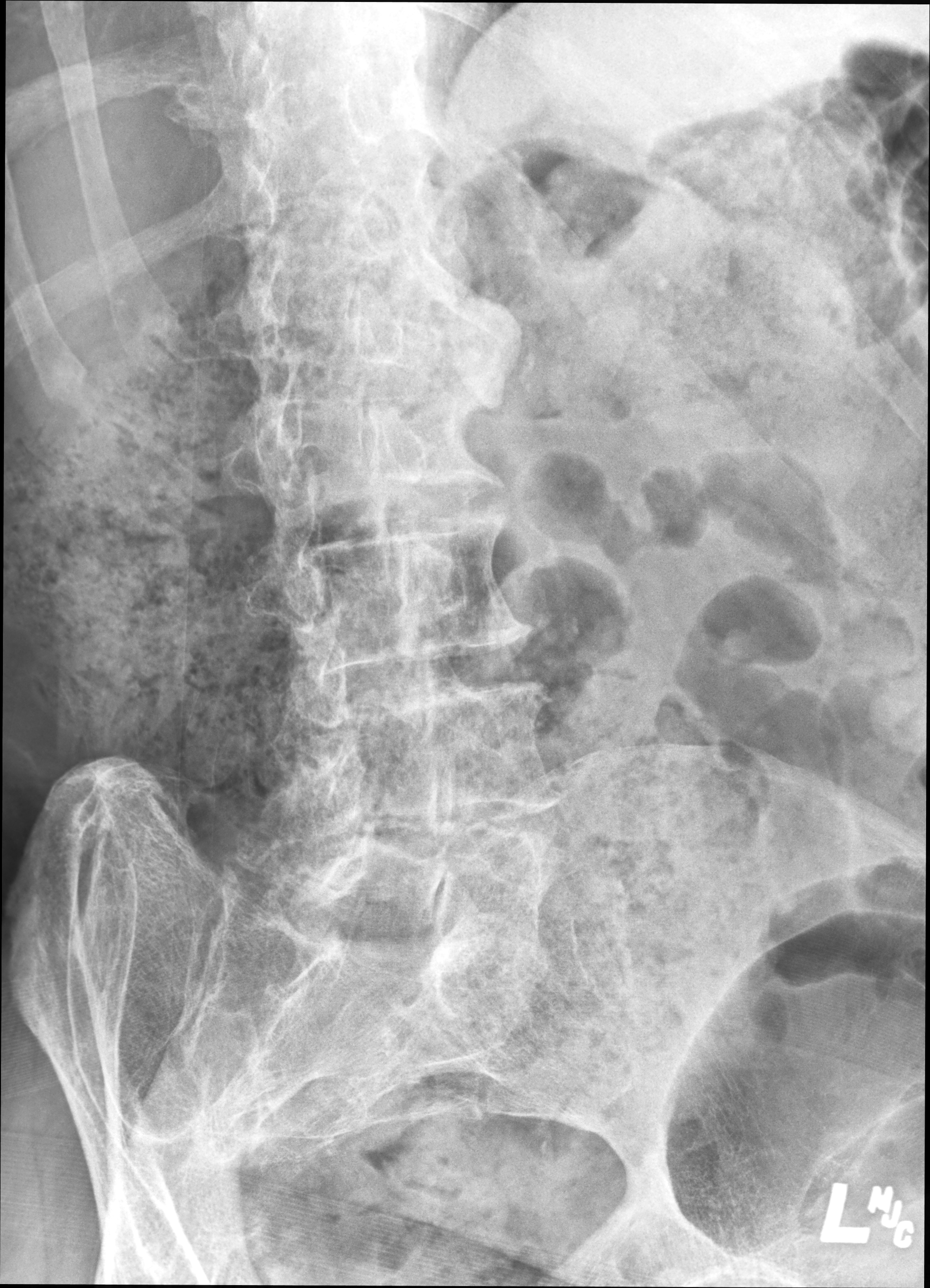

[lumbar spine lat (1 of 2)]
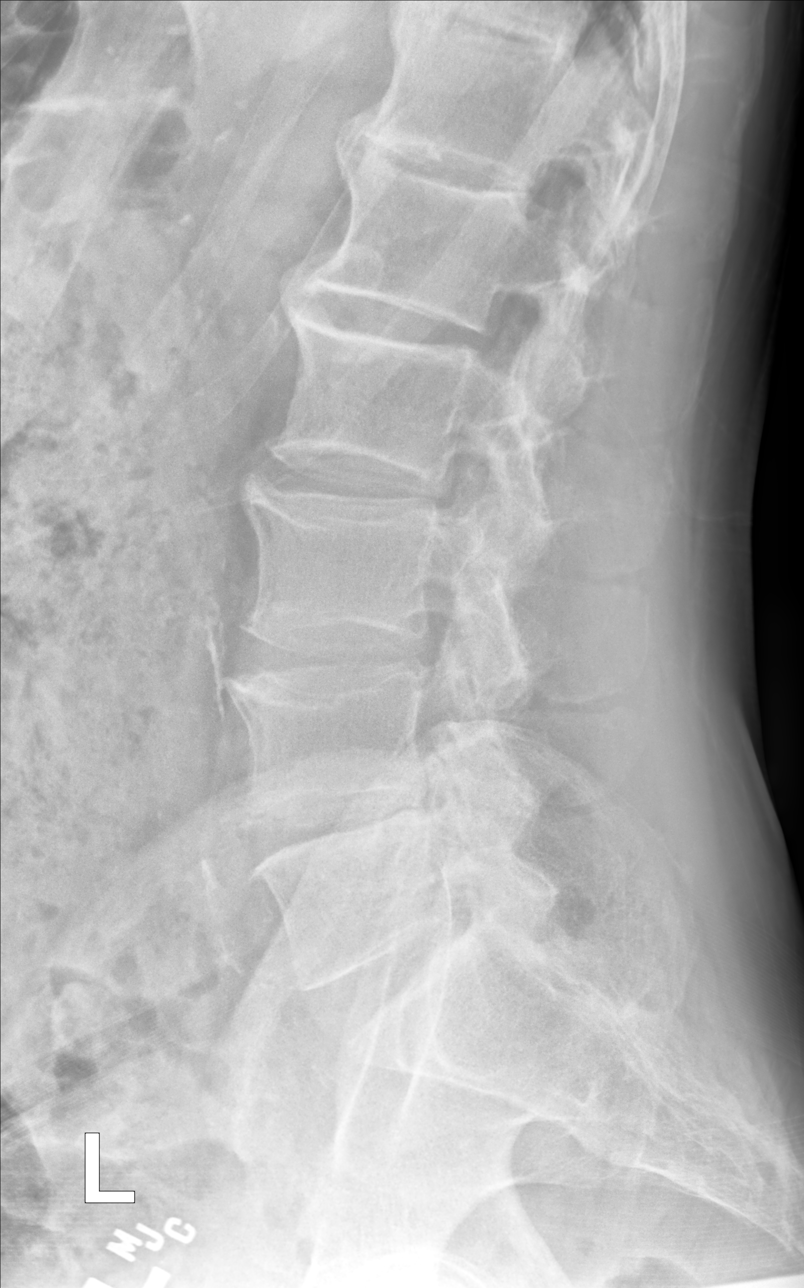

[lumbar spine lat (2 of 2)]
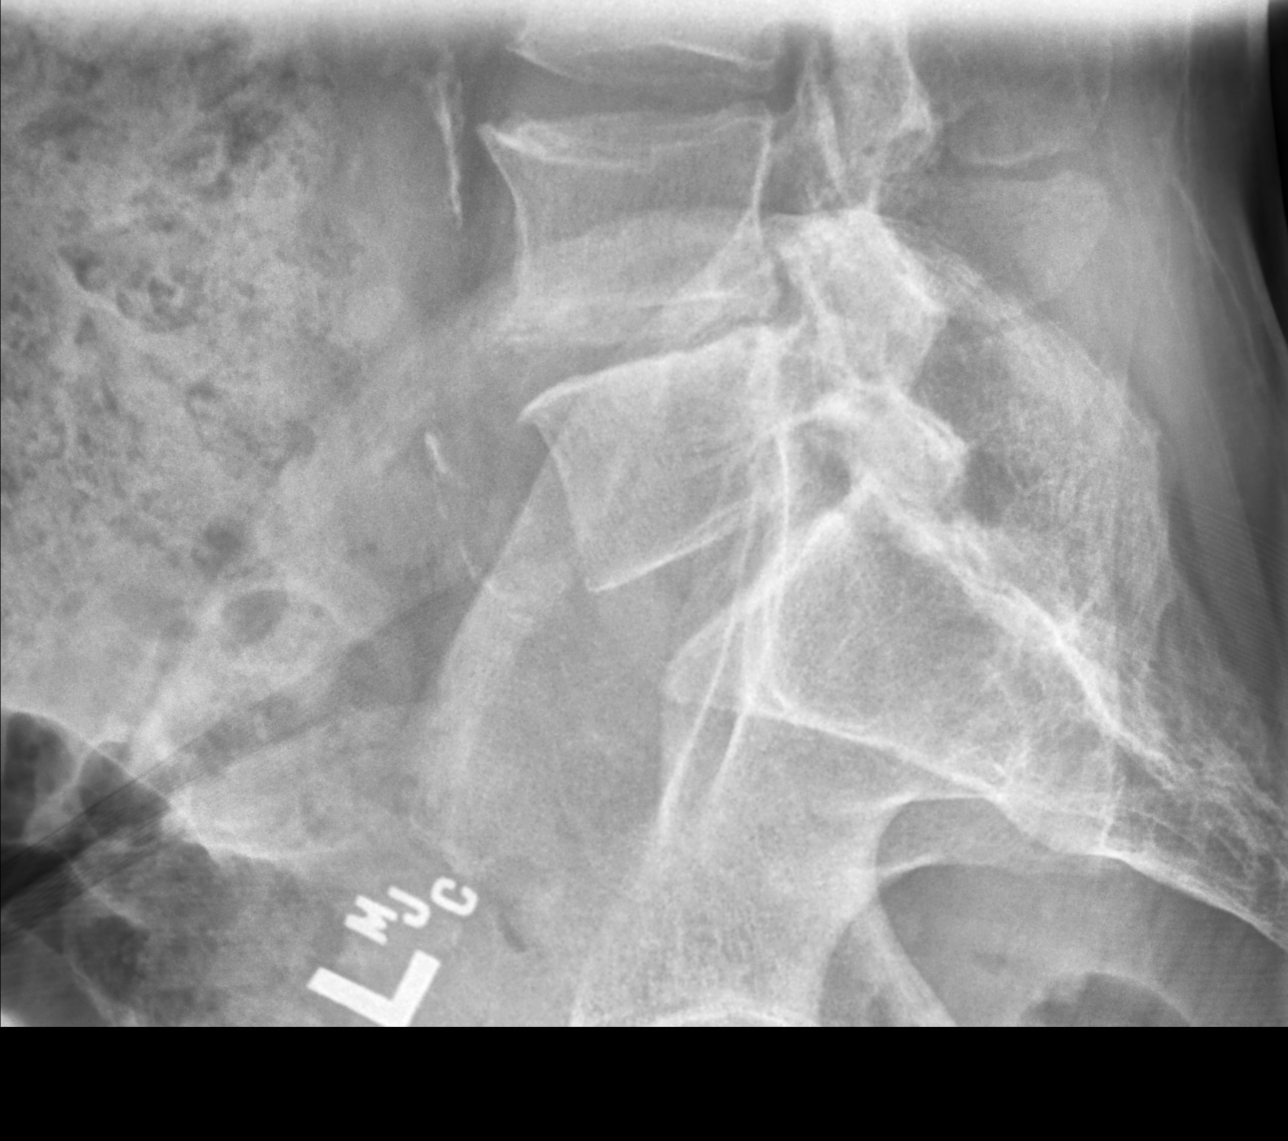

[5 of 5 positions shown; findings below may reference images not displayed]

FINDINGS: Five non-rib-bearing lumbar vertebrae. Mild to moderate dextroconvex
thoracolumbar rotary scoliosis. Varying degrees of anterior and
lateral spur formation in the lumbar and lower thoracic spine. Facet
degenerative changes in the mid and lower lumbar spine. No
fractures, pars defects or subluxations. Atheromatous arterial
calcifications. Large amount of stool in the colon.
IMPRESSION: 1. Thoracolumbar scoliosis and multilevel degenerative changes.
2. Large amount of stool in the colon.

## 2020-02-27 ENCOUNTER — Ambulatory Visit
Admission: RE | Admit: 2020-02-27 | Discharge: 2020-02-27 | Disposition: A | Discharge status: Home / Self Care | Payer: Medicare Other | Source: Ambulatory Visit | Attending: Internal Medicine | Admitting: Internal Medicine

## 2020-02-27 DIAGNOSIS — E041 Nontoxic single thyroid nodule: Secondary | ICD-10-CM

## 2020-04-10 ENCOUNTER — Other Ambulatory Visit: Payer: Self-pay | Admitting: Internal Medicine

## 2020-04-10 DIAGNOSIS — M159 Polyosteoarthritis, unspecified: Secondary | ICD-10-CM

## 2020-04-14 ENCOUNTER — Other Ambulatory Visit: Payer: Self-pay | Admitting: Cardiology

## 2020-04-14 ENCOUNTER — Other Ambulatory Visit: Payer: Self-pay | Admitting: Internal Medicine

## 2020-04-14 DIAGNOSIS — M159 Polyosteoarthritis, unspecified: Secondary | ICD-10-CM

## 2020-05-06 ENCOUNTER — Telehealth: Payer: Self-pay | Admitting: *Deleted

## 2020-05-06 NOTE — Telephone Encounter (Signed)
    [facility] Pre-operative Risk Assessment    HEARTCARE STAFF: - Please ensure there is not already an duplicate clearance open for this procedure. - Under Visit Info/Reason for Call, type in Other and utilize the format Clearance MM/DD/YY or Clearance TBD. Do not use dashes or single digits. - If request is for dental extraction, please clarify the # of teeth to be extracted.  Request for surgical clearance:  1. What type of surgery is being performed? Left total knee arthroplasty   2. When is this surgery scheduled? 05/26/20   3. What type of clearance is required (medical clearance vs. Pharmacy clearance to hold med vs. Both)? medical  4. Are there any medications that need to be held prior to surgery and how long?none   5. Practice name and name of physician performing surgery? [facility]   6. What is the office phone number? [phone]   7.   What is the office fax number? [phone]  8.   Anesthesia type (None, local, MAC, general) ? spinal   Fredia Beets 05/06/2020, 1:44 PM  _________________________________________________________________   (provider comments below)

## 2020-05-07 NOTE — Telephone Encounter (Signed)
Called patient and explained reason for call. Patient is now scheduled to see Almyra Deforest, PA-C on 05/12/20 at 9:15 AM

## 2020-05-07 NOTE — Progress Notes (Signed)
Sent message, via epic in basket, requesting orders in epic from surgeon.  

## 2020-05-07 NOTE — Telephone Encounter (Signed)
Primary Cardiologist:Brian Stanford Breed, MD  Chart reviewed as part of pre-operative protocol coverage. Because of David [name]'s past medical history and time since last visit, he/she will require a follow-up visit in order to better assess preoperative cardiovascular risk.  Pre-op covering staff: - Please schedule appointment and call patient to inform them. - Please contact requesting surgeon's office via preferred method (i.e, phone, fax) to inform them of need for appointment prior to surgery.  If applicable, this message will also be routed to pharmacy pool and/or primary cardiologist for input on holding anticoagulant/antiplatelet agent as requested below so that this information is available at time of patient's appointment.   Deberah Pelton, NP  05/07/2020, 8:56 AM

## 2020-05-12 ENCOUNTER — Other Ambulatory Visit: Payer: Self-pay

## 2020-05-12 ENCOUNTER — Encounter: Payer: Self-pay | Admitting: Physician Assistant

## 2020-05-12 ENCOUNTER — Ambulatory Visit: Payer: Medicare Other | Admitting: Physician Assistant

## 2020-05-12 VITALS — BP 128/76 | HR 72 | Ht 72.0 in | Wt 198.2 lb

## 2020-05-12 DIAGNOSIS — I712 Thoracic aortic aneurysm, without rupture, unspecified: Secondary | ICD-10-CM

## 2020-05-12 DIAGNOSIS — Z01810 Encounter for preprocedural cardiovascular examination: Secondary | ICD-10-CM | POA: Diagnosis not present

## 2020-05-12 DIAGNOSIS — R0609 Other forms of dyspnea: Secondary | ICD-10-CM

## 2020-05-12 DIAGNOSIS — I779 Disorder of arteries and arterioles, unspecified: Secondary | ICD-10-CM | POA: Diagnosis not present

## 2020-05-12 DIAGNOSIS — Z79899 Other long term (current) drug therapy: Secondary | ICD-10-CM | POA: Diagnosis not present

## 2020-05-12 DIAGNOSIS — E785 Hyperlipidemia, unspecified: Secondary | ICD-10-CM

## 2020-05-12 DIAGNOSIS — I10 Essential (primary) hypertension: Secondary | ICD-10-CM | POA: Diagnosis not present

## 2020-05-12 LAB — HEPATIC FUNCTION PANEL
ALT: 21 IU/L (ref 0–44)
AST: 18 IU/L (ref 0–40)
Albumin: 4.2 g/dL (ref 3.7–4.7)
Alkaline Phosphatase: 127 IU/L — ABNORMAL HIGH (ref 44–121)
Bilirubin Total: 0.5 mg/dL (ref 0.0–1.2)
Bilirubin, Direct: 0.16 mg/dL (ref 0.00–0.40)
Total Protein: 6.9 g/dL (ref 6.0–8.5)

## 2020-05-12 LAB — LIPID PANEL
Chol/HDL Ratio: 3.2 ratio (ref 0.0–5.0)
Cholesterol, Total: 151 mg/dL (ref 100–199)
HDL: 47 mg/dL (ref >39)
LDL Chol Calc (NIH): 89 mg/dL (ref 0–99)
Triglycerides: 77 mg/dL (ref 0–149)
VLDL Cholesterol Cal: 15 mg/dL (ref 5–40)

## 2020-05-12 NOTE — Progress Notes (Signed)
Cardiology Office Note:    Date:  05/13/2020   ID:  David [name], DOB 30-Jul-1940, MRN [id number]  PCP:  Cassandria Anger, MD   [place]  Cardiologist:  [name], MD  Advanced Practice Provider:  No care team member to display Electrophysiologist:  None   Referring MD: Cassandria Anger, MD   Chief Complaint  Patient presents with  . Follow-up    Pre-Op clearance   . Pre-op Exam    Pending total knee surgery by Dr. [name]    History of Present Illness:    David [name] is a 80 y.o. male with a hx of hypertension, hyperlipidemia, and carotid artery disease.  Previous abdominal CT obtained in December 2015 showed no aneurysm but noted coronary artery calcification.  Carotid Doppler obtained in December 2020 showed 40 to 59% right and 1 to 39% left stenosis, there was also a right thyroid nodule.  Thyroid ultrasound obtained in December 2020 showed right mid thyroid nodule, right inferior thyroid nodule, follow-up in 1 year was recommended.  Lower extremity venous Doppler in April 2021 showed no DVT.  Echocardiogram obtained in April 2021 showed normal EF, grade 1 DD, mild LAE, mildly dilated aortic root measuring 42 to 43 mm.  CTA in April 2021 showed no PE, atherosclerosis of the great vessels, aorta and the coronary arteries noted.  Abdominal ultrasound in April 2021 was normal.  PFT in May 2021 showed mild obstruction.  Cardiac catheterization in June 2021 showed mild nonobstructive CAD, normal right pressure, low left LV filling pressure and no shunt.  He has been evaluated by pulmonology service for dyspnea, etiology of dyspnea is unclear.  He was last seen by Dr. Stanford Breed in July 2021 at which time he was doing well, his dyspnea was felt unlikely to be related to cardiac issue.  Patient presents today for follow-up.  He denies any recent chest pain or shortness of breath.  He is planning to undergo left total knee arthroplasty on 05/26/2020 by  Dr. Honor Loh.  I reviewed his previous cardiac catheterization results with him, given lack of any anginal symptoms, he is cleared without further work-up.  He may hold his aspirin for 7 days prior to the surgery and restart as soon as possible after the surgery.  EKG does show some PVCs and left bundle branch block.  He is overdue for fasting lipid panel/liver function test and carotid Doppler, both nonurgent and can be done after his surgery.  He says he likely will require surgery on the right knee in the future as well.  We likely will give him a phone call prior to the surgery, as long as he does not have any recurrence of chest pain or worsening dyspnea, he is cleared for the other surgery as well.   Past Medical History:  Diagnosis Date  . Allergy   . Arthritis    gen.  and left hip  . BPH (benign prostatic hypertrophy)   . GERD (gastroesophageal reflux disease)    occasional  . History of kidney stones    2007  . Hyperlipidemia   . Hypertension   . Kidney tumor   . LBP (low back pain) 06/2005   L5 radicular symptoms; MRI of LS spine severe spondylosis at L4-L5 with central cancal stenosis   . Nocturia   . Organic impotence   . Prostate cancer ([place]) 10/2011   (Low grade) [facility] - Dr. Junious Silk  . Pyelonephritis 03/05/2013  .  Renal oncocytoma of right kidney 01/23/2013   As 3.2 x 3.5 cm enhancing exophytic mass projecting off the lower pole. This is consistent with a renal cell carcinoma. No retroperitoneal lymphadenopathy.   [name] Spermatocele   . 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 Wears glasses     Past Surgical History:  Procedure Laterality Date  . COLONOSCOPY  02/15/2006, 2014   Internal hemorrhoids (Dr [name]), last in 2014  . INGUINAL HERNIA REPAIR Right 1980  . LYMPHADENECTOMY Bilateral 02/15/2013   Procedure: LYMPHADENECTOMY WITH INDOCYANINE GREEN DYE INJECTION;  Surgeon: [name], MD;  Location: [facility];  Service: Urology;  Laterality: Bilateral;  . PROSTATE BIOPSY N/A  12/18/2012   Procedure: BIOPSY TRANSRECTAL ULTRASONIC PROSTATE (TUBP);  Surgeon: [name], MD;  Location: [facility];  Service: Urology;  Laterality: N/A;  . PROSTATE BIOPSY  12/05/11   gleason 6, 3/12 cores  . REPAIR UMBILICAL AND VENTRAL HERNIA'S W/ MESH  08-21-2009  . RIGHT/LEFT HEART CATH AND CORONARY ANGIOGRAPHY N/A 08/06/2019   Procedure: RIGHT/LEFT HEART CATH AND CORONARY ANGIOGRAPHY;  Surgeon: [name], Peter M, MD;  Location: [facility];  Service: Cardiovascular;  Laterality: N/A;  . ROBOT ASSISTED LAPAROSCOPIC RADICAL PROSTATECTOMY N/A 02/15/2013   Procedure: ROBOTIC ASSISTED LAPAROSCOPIC RADICAL PROSTATECTOMY;  Surgeon: [name], MD;  Location: [facility];  Service: Urology;  Laterality: N/A;  . ROBOTIC ASSITED PARTIAL NEPHRECTOMY Right 02/15/2013   Procedure: ROBOTIC ASSITED PARTIAL NEPHRECTOMY;  Surgeon: [name], MD;  Location: [facility];  Service: Urology;  Laterality: Right;  . SPERMATOCELECTOMY Left 12/18/2012   Procedure: LEFT SPERMATOCELECTOMY;  Surgeon: [name], MD;  Location: [facility];  Service: Urology;  Laterality: Left;  . TONSILLECTOMY AND ADENOIDECTOMY  as child  . TRANSURETHRAL RESECTION OF PROSTATE  AGE 39    Current Medications: Current Meds  Medication Sig  . aspirin EC 81 MG tablet Take 81 mg by mouth every evening.   [name] atorvastatin (LIPITOR) 80 MG tablet TAKE 1 TABLET(80 MG) BY MOUTH DAILY AT 6 PM (Patient taking differently: Take 80 mg by mouth daily.)  . cetirizine (ZYRTEC) 10 MG tablet Take 1 tablet (10 mg total) by mouth daily. (Patient taking differently: Take 10 mg by mouth daily as needed for allergies.)  . DULoxetine (CYMBALTA) 30 MG capsule TAKE ONE CAPSULE BY MOUTH DAILY (Patient taking differently: Take 30 mg by mouth daily.)  . fluticasone (FLONASE) 50 MCG/ACT nasal spray SHAKE LIQUID AND USE 2 SPRAYS IN EACH NOSTRIL DAILY (Patient taking differently: Place 2 sprays into both  nostrils daily as needed for allergies.)  . Iodoquinol-HC (HYDROCORTISONE-IODOQUINOL) 1-1 % CREA Apply 1 application topically daily as needed (Rash on perineal).   [name] olmesartan (BENICAR) 20 MG tablet TAKE 1 TABLET(20 MG) BY MOUTH DAILY (Patient taking differently: Take 20 mg by mouth daily.)  . sodium chloride (OCEAN) 0.65 % nasal spray Place 1 spray into the nose daily as needed for congestion.  . Vitamin D, Cholecalciferol, 50 MCG (2000 UT) CAPS Take 2,000 Units by mouth daily.   . [DISCONTINUED] pantoprazole (PROTONIX) 40 MG tablet TAKE 1 TABLET(40 MG) BY MOUTH TWICE DAILY (Patient taking differently: Take 40 mg by mouth daily.)     Allergies:   Codeine, Ace inhibitors, Amlodipine, and Percocet [oxycodone-acetaminophen]   Social History   Socioeconomic History  . Marital status: Married    Spouse name: Not on file  . Number of children: 2  . Years of education: Not on file  . Highest education level: Not  on file  Occupational History  . Not on file  Tobacco Use  . Smoking status: Former Smoker    Packs/day: 0.25    Years: 10.00    Pack years: 2.50    Types: Cigarettes    Quit date: 02/28/1981    Years since quitting: 39.2  . Smokeless tobacco: Never Used  Vaping Use  . Vaping Use: Never used  Substance and Sexual Activity  . Alcohol use: Yes    Alcohol/week: 1.0 standard drink    Types: 1 Glasses of wine per week  . Drug use: Not Currently    Types: Flunitrazepam  . Sexual activity: Not on file  Other Topics Concern  . Not on file  Social History Narrative   Married- 7 yrs   2 daughters   5 grandchildren   Never Smoked   Alcohol use-yes (occasional/social)   Retired- Engineer, water      Physician roster:      Urologist - Dr. [name]   Orthopedics - Dr. [name]   Dermatology - Dr. [name]   Social Determinants of Health   Financial Resource Strain: Not on file  Food Insecurity: Not on file  Transportation Needs: Not on file  Physical Activity: Not on file   Stress: Not on file  Social Connections: Not on file     Family History: The patient's family history includes Cancer in his mother; Colon cancer (age of onset: 70) in his mother; Coronary artery disease in an other family member; Hyperlipidemia in an other family member; Pulmonary fibrosis in his brother and father. There is no history of Colon polyps, Esophageal cancer, Rectal cancer, or Stomach cancer.  ROS:   Please see the history of present illness.     All other systems reviewed and are negative.  EKGs/Labs/Other Studies Reviewed:    The following studies were reviewed today:  Cath 7/0/6237  LV end diastolic pressure is normal.   1. Mild nonobstructive CAD with coronary calcification 2. Normal right heart pressures 3. Low LV filling pressures 4. Normal cardiac output. No shunt.  Plan: consider other reasons for his dyspnea.     EKG:  EKG is ordered today.  The ekg ordered today demonstrates normal sinus rhythm with frequent PACs.  Recent Labs: 05/30/2019: TSH 1.42 07/01/2019: NT-Pro BNP 96 07/31/2019: Platelets 220 08/06/2019: BUN 23; Creatinine, Ser 1.17; Hemoglobin 12.6; Potassium 4.1; Sodium 146 05/12/2020: ALT 21  Recent Lipid Panel    Component Value Date/Time   CHOL 151 05/12/2020 1029   TRIG 77 05/12/2020 1029   HDL 47 05/12/2020 1029   CHOLHDL 3.2 05/12/2020 1029   CHOLHDL 3 10/04/2017 0829   VLDL 13.6 10/04/2017 0829   LDLCALC 89 05/12/2020 1029   LDLDIRECT 122.1 03/05/2012 1619     Risk Assessment/Calculations:       Physical Exam:    VS:  BP 128/76   Pulse 72   Ht 6' (1.829 m)   Wt 198 lb 3.2 oz (89.9 kg)   SpO2 96%   BMI 26.88 kg/m     Wt Readings from Last 3 Encounters:  05/12/20 198 lb 3.2 oz (89.9 kg)  09/23/19 (!) 204 lb 3.2 oz (92.6 kg)  09/12/19 204 lb (92.5 kg)     GEN:  Well nourished, well developed in no acute distress HEENT: Normal NECK: No JVD; No carotid bruits LYMPHATICS: No lymphadenopathy CARDIAC: RRR, no murmurs,  rubs, gallops RESPIRATORY:  Clear to auscultation without rales, wheezing or rhonchi  ABDOMEN: Soft, non-tender,  non-distended MUSCULOSKELETAL:  No edema; No deformity  SKIN: Warm and dry NEUROLOGIC:  Alert and oriented x 3 PSYCHIATRIC:  Normal affect   ASSESSMENT:    1. Preop cardiovascular exam   2. Bilateral carotid artery disease, unspecified type ([place])   3. Medication management   4. Essential hypertension   5. Hyperlipidemia LDL goal <70   6. Thoracic aortic aneurysm without rupture ([place])   7. Chronic dyspnea    PLAN:    In order of problems listed above:  1. Preoperative evaluation: Upcoming knee surgery by Dr. [name].  Previous cardiac catheterization in June 2021 showed minimal plaque at best.  He denies any recent chest discomfort or shortness of breath.  He may hold aspirin for 5 to 7 days prior to the surgery and restart as soon as possible after the surgery.  He is cleared to proceed with knee surgery as a low risk patient.  Patient mentions he likely will require the same surgery on the opposite knee in the future.  If the surgery on the contralateral knee happens within the next 66-month, he is cleared as well.  If the surgery ended up having beyond the next 61-month, our preop APP can potentially call the patient and clear him over the phone.  2. Bilateral carotid artery disease: He will require a nonurgent carotid Doppler in the next few months  3. Hypertension: Continue on current therapy  4. Hyperlipidemia: On Lipitor  5. Chronic dyspnea: Unlikely to be cardiac in nature.        Medication Adjustments/Labs and Tests Ordered: Current medicines are reviewed at length with the patient today.  Concerns regarding medicines are outlined above.  Orders Placed This Encounter  Procedures  . Lipid Profile  . Hepatic function panel  . EKG 12-Lead  . VAS US CAROTID   No orders of the defined types were placed in this encounter.   Patient Instructions  Medication  Instructions:  The current medical regimen is effective;  continue present plan and medications as directed. Please refer to the Current Medication list given to you today. *If you need a refill on your cardiac medications before your next appointment, please call your pharmacy*  Lab Work: FASTING LIPID PANEL AND LIVER FUNCTION If you have labs (blood work) drawn today and your tests are completely normal, you will receive your results only by:  [place] (if you have MyChart) OR A paper copy in the mail.  If you have any lab test that is abnormal or we need to change your treatment, we will call you to review the results. You may go to any [facility] that is convenient for you however, we do have a lab in our office that is able to assist you. You DO NOT need an appointment for our lab. The lab is open 8:00am and closes at 4:00pm. Lunch 12:45 - 1:45pm.  Testing/Procedures: Your physician has requested that you have a carotid duplex. This test is an ultrasound of the carotid arteries in your neck. It looks at blood flow through these arteries that supply the brain with blood. Allow one hour for this exam. There are no restrictions or special instructions.  Follow-Up: Your next appointment:  6 month(s) In Person with You may see [name], MD, [facility], PA-C or one of the following Advanced Practice Providers on your designated Care Team:  Sande Rives, PA-C  Coletta Memos, FNP  Please call our office 2 months in advance to schedule this appointment   At  [facility], you and your health needs are our priority.  As part of our continuing mission to provide you with exceptional heart care, we have created designated Provider Care Teams.  These Care Teams include your primary Cardiologist (physician) and Advanced Practice Providers (APPs -  Physician Assistants and Nurse Practitioners) who all work together to provide you with the care you need, when you need it.     Hilbert Corrigan,  [place]  05/13/2020 10:58 PM    [facility]

## 2020-05-12 NOTE — Patient Instructions (Signed)
Medication Instructions:  The current medical regimen is effective;  continue present plan and medications as directed. Please refer to the Current Medication list given to you today. *If you need a refill on your cardiac medications before your next appointment, please call your pharmacy*  Lab Work: FASTING LIPID PANEL AND LIVER FUNCTION If you have labs (blood work) drawn today and your tests are completely normal, you will receive your results only by:  [place] (if you have MyChart) OR A paper copy in the mail.  If you have any lab test that is abnormal or we need to change your treatment, we will call you to review the results. You may go to any [facility] that is convenient for you however, we do have a lab in our office that is able to assist you. You DO NOT need an appointment for our lab. The lab is open 8:00am and closes at 4:00pm. Lunch 12:45 - 1:45pm.  Testing/Procedures: Your physician has requested that you have a carotid duplex. This test is an ultrasound of the carotid arteries in your neck. It looks at blood flow through these arteries that supply the brain with blood. Allow one hour for this exam. There are no restrictions or special instructions.  Follow-Up: Your next appointment:  6 month(s) In Person with You may see [name], MD, [facility], PA-C or one of the following Advanced Practice Providers on your designated Care Team:  Sande Rives, PA-C  Coletta Memos, FNP  Please call our office 2 months in advance to schedule this appointment   At [facility], you and your health needs are our priority.  As part of our continuing mission to provide you with exceptional heart care, we have created designated Provider Care Teams.  These Care Teams include your primary Cardiologist (physician) and Advanced Practice Providers (APPs -  Physician Assistants and Nurse Practitioners) who all work together to provide you with the care you need, when you need it.

## 2020-05-13 ENCOUNTER — Other Ambulatory Visit: Payer: Self-pay | Admitting: Internal Medicine

## 2020-05-13 ENCOUNTER — Encounter: Payer: Self-pay | Admitting: Physician Assistant

## 2020-05-15 ENCOUNTER — Telehealth: Payer: Self-pay | Admitting: *Deleted

## 2020-05-15 NOTE — Telephone Encounter (Signed)
Patient states he saw his cardiologist on the 15th and got an ekg and blood work and is wondering if he still needs to come see Dr. Alain Marion if he just had that done.

## 2020-05-15 NOTE — Telephone Encounter (Signed)
MD received medial clearance forms MD states pt need preop appt. Called pt there was no answer LMOM RTC need appt for medical clearance.Marland KitchenJohny Chess

## 2020-05-15 NOTE — Progress Notes (Signed)
Cholesterol well controlled, liver function ok.

## 2020-05-17 NOTE — H&P (Signed)
TOTAL KNEE ADMISSION H&P  Patient is being admitted for left total knee arthroplasty.  Subjective:  Chief Complaint:left knee pain.  HPI: David [name], 80 y.o. male, has a history of pain and functional disability in the left knee due to arthritis and has failed non-surgical conservative treatments for greater than 12 weeks to includeNSAID's and/or analgesics, corticosteriod injections and activity modification.  Onset of symptoms was gradual, starting 1+ years ago with gradually worsening course since that time. The patient noted no past surgery on the left knee(s).  Patient currently rates pain in the left knee(s) at 9 out of 10 with activity. Patient has worsening of pain with activity and weight bearing, pain that interferes with activities of daily living, pain with passive range of motion, crepitus and joint swelling.  Patient has evidence of periarticular osteophytes and joint space narrowing by imaging studies.  There is no active infection.  Risks, benefits and expectations were discussed with the patient.  Risks including but not limited to the risk of anesthesia, blood clots, nerve damage, blood vessel damage, failure of the prosthesis, infection and up to and including death.  Patient understand the risks, benefits and expectations and wishes to proceed with surgery.   D/C Plans:       Home  Post-op Meds:       No Rx given   Tranexamic Acid:      To be given - IV   Decadron:      Is to be given  FYI:      ASA  Dilaudid (codeine allergy)   DME:    Rx sent for - RW & 3-N-1  PT:   OPPT    Pharmacy: [facility]    Patient Active Problem List   Diagnosis Date Noted  . Foot contracture, right 06/06/2019  . DOE (dyspnea on exertion) 05/30/2019  . Edema 05/30/2019  . Thyroid nodule 04/15/2019  . Neck pain 04/15/2019  . Carotid bruit 01/10/2019  . Vitamin D deficiency, unspecified 04/06/2018  . Prediabetes 04/06/2018  . Elevated alkaline phosphatase  level 04/06/2018  . Allergic rhinitis 11/18/2015  . Generalized osteoarthritis of multiple sites 08/06/2015  . GERD (gastroesophageal reflux disease) 06/25/2015  . Pulmonary nodule 07/05/2013  . Cough 06/07/2013  . Fatigue 06/07/2013  . Blood loss anemia 03/18/2013  . Right retroperitoneal urinoma with ureteral leak 03/05/2013  . Renal oncocytoma of right kidney 01/23/2013  . Nocturia 01/11/2013  . Left hip pain 03/05/2012  . Hyperglycemia 06/08/2011  . Prostate cancer ([place]) 05/04/2010  . HERNIA, UMBILICAL 67/61/9509  . ERECTILE DYSFUNCTION, ORGANIC 03/10/2009  . Dyslipidemia 03/06/2008  . Essential hypertension 03/06/2008   Past Medical History:  Diagnosis Date  . Allergy   . Arthritis    gen.  and left hip  . BPH (benign prostatic hypertrophy)   . GERD (gastroesophageal reflux disease)    occasional  . History of kidney stones    2007  . Hyperlipidemia   . Hypertension   . Kidney tumor   . LBP (low back pain) 06/2005   L5 radicular symptoms; MRI of LS spine severe spondylosis at L4-L5 with central cancal stenosis   . Nocturia   . Organic impotence   . Prostate cancer ([place]) 10/2011   (Low grade) [facility] - Dr. Junious Silk  . Pyelonephritis 03/05/2013  . Renal oncocytoma of right kidney 01/23/2013   As 3.2 x 3.5 cm enhancing exophytic mass projecting off the lower pole. This is consistent with a renal  cell carcinoma. No retroperitoneal lymphadenopathy.   [name] Spermatocele   . 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 Wears glasses     Past Surgical History:  Procedure Laterality Date  . COLONOSCOPY  02/15/2006, 2014   Internal hemorrhoids (Dr [name]), last in 2014  . INGUINAL HERNIA REPAIR Right 1980  . LYMPHADENECTOMY Bilateral 02/15/2013   Procedure: LYMPHADENECTOMY WITH INDOCYANINE GREEN DYE INJECTION;  Surgeon: [name], MD;  Location: [facility];  Service: Urology;  Laterality: Bilateral;  . PROSTATE BIOPSY N/A 12/18/2012   Procedure: BIOPSY TRANSRECTAL ULTRASONIC PROSTATE  (TUBP);  Surgeon: [name], MD;  Location: [facility];  Service: Urology;  Laterality: N/A;  . PROSTATE BIOPSY  12/05/11   gleason 6, 3/12 cores  . REPAIR UMBILICAL AND VENTRAL HERNIA'S W/ MESH  08-21-2009  . RIGHT/LEFT HEART CATH AND CORONARY ANGIOGRAPHY N/A 08/06/2019   Procedure: RIGHT/LEFT HEART CATH AND CORONARY ANGIOGRAPHY;  Surgeon: [name], Peter M, MD;  Location: [facility];  Service: Cardiovascular;  Laterality: N/A;  . ROBOT ASSISTED LAPAROSCOPIC RADICAL PROSTATECTOMY N/A 02/15/2013   Procedure: ROBOTIC ASSISTED LAPAROSCOPIC RADICAL PROSTATECTOMY;  Surgeon: [name], MD;  Location: [facility];  Service: Urology;  Laterality: N/A;  . ROBOTIC ASSITED PARTIAL NEPHRECTOMY Right 02/15/2013   Procedure: ROBOTIC ASSITED PARTIAL NEPHRECTOMY;  Surgeon: [name], MD;  Location: [facility];  Service: Urology;  Laterality: Right;  . SPERMATOCELECTOMY Left 12/18/2012   Procedure: LEFT SPERMATOCELECTOMY;  Surgeon: [name], MD;  Location: [facility];  Service: Urology;  Laterality: Left;  . TONSILLECTOMY AND ADENOIDECTOMY  as child  . TRANSURETHRAL RESECTION OF PROSTATE  AGE 81    No current facility-administered medications for this encounter.   Current Outpatient Medications  Medication Sig Dispense Refill Last Dose  . aspirin EC 81 MG tablet Take 81 mg by mouth every evening.      [name] atorvastatin (LIPITOR) 80 MG tablet TAKE 1 TABLET(80 MG) BY MOUTH DAILY AT 6 PM (Patient taking differently: Take 80 mg by mouth daily.) 90 tablet 3   . cetirizine (ZYRTEC) 10 MG tablet Take 1 tablet (10 mg total) by mouth daily. (Patient taking differently: Take 10 mg by mouth daily as needed for allergies.) 90 tablet 2   . DULoxetine (CYMBALTA) 30 MG capsule TAKE ONE CAPSULE BY MOUTH DAILY (Patient taking differently: Take 30 mg by mouth daily.) 30 capsule 5   . fluticasone (FLONASE) 50 MCG/ACT nasal spray SHAKE LIQUID AND USE 2 SPRAYS IN EACH  NOSTRIL DAILY (Patient taking differently: Place 2 sprays into both nostrils daily as needed for allergies.) 48 g 1   . Iodoquinol-HC (HYDROCORTISONE-IODOQUINOL) 1-1 % CREA Apply 1 application topically daily as needed (Rash on perineal).   2   . olmesartan (BENICAR) 20 MG tablet TAKE 1 TABLET(20 MG) BY MOUTH DAILY (Patient taking differently: Take 20 mg by mouth daily.) 90 tablet 3   . sodium chloride (OCEAN) 0.65 % nasal spray Place 1 spray into the nose daily as needed for congestion.     . Vitamin D, Cholecalciferol, 50 MCG (2000 UT) CAPS Take 2,000 Units by mouth daily.      . pantoprazole (PROTONIX) 40 MG tablet Take 1 tablet (40 mg total) by mouth 2 (two) times daily. Annual appt due in June must see provider for future refills 60 tablet 3    Allergies  Allergen Reactions  . Codeine Itching    Severe hiccups  . Ace Inhibitors Other (See Comments)    REACTION: Cough  .  Amlodipine     LE swelling  . Percocet [Oxycodone-Acetaminophen]     Severe hiccups    Social History   Tobacco Use  . Smoking status: Former Smoker    Packs/day: 0.25    Years: 10.00    Pack years: 2.50    Types: Cigarettes    Quit date: 02/28/1981    Years since quitting: 39.2  . Smokeless tobacco: Never Used  Substance Use Topics  . Alcohol use: Yes    Alcohol/week: 1.0 standard drink    Types: 1 Glasses of wine per week    Family History  Problem Relation Age of Onset  . Cancer Mother        colon  . Colon cancer Mother 50  . Pulmonary fibrosis Father        father owned a Radio broadcast assistant (father blamed inhalation of chemicals and fumes)  . Pulmonary fibrosis Brother   . Coronary artery disease Other   . Hyperlipidemia Other   . Colon polyps Neg Hx   . Esophageal cancer Neg Hx   . Rectal cancer Neg Hx   . Stomach cancer Neg Hx      Review of Systems  Constitutional: Negative.   HENT: Negative.   Eyes: Negative.   Respiratory: Negative.   Cardiovascular: Negative.   Gastrointestinal:  Positive for heartburn.  Genitourinary: Negative.   Musculoskeletal: Positive for joint pain.  Skin: Negative.   Neurological: Negative.   Endo/Heme/Allergies: Positive for environmental allergies.  Psychiatric/Behavioral: Negative.      Objective:  Physical Exam Constitutional:      Appearance: He is well-developed.  HENT:     Head: Normocephalic.  Eyes:     Pupils: Pupils are equal, round, and reactive to light.  Neck:     Thyroid: No thyromegaly.     Vascular: No JVD.     Trachea: No tracheal deviation.  Cardiovascular:     Rate and Rhythm: Normal rate and regular rhythm.  Pulmonary:     Effort: Pulmonary effort is normal. No respiratory distress.     Breath sounds: Normal breath sounds. No wheezing.  Abdominal:     Palpations: Abdomen is soft.     Tenderness: There is no abdominal tenderness. There is no guarding.  Musculoskeletal:     Cervical back: Neck supple.     Left knee: Swelling and bony tenderness present. No erythema or ecchymosis. Decreased range of motion. Tenderness present.  Lymphadenopathy:     Cervical: No cervical adenopathy.  Skin:    General: Skin is warm and dry.  Neurological:     Mental Status: He is alert and oriented to person, place, and time.       Labs:  Estimated body mass index is 26.88 kg/m as calculated from the following:   Height as of 05/12/20: 6' (1.829 m).   Weight as of 05/12/20: 89.9 kg.   Imaging Review Plain radiographs demonstrate severe degenerative joint disease of the left knee(s).  The bone quality appears to be good for age and reported activity level.      Assessment/Plan:  End stage arthritis, left knee   The patient history, physical examination, clinical judgment of the provider and imaging studies are consistent with end stage degenerative joint disease of the left knee(s) and total knee arthroplasty is deemed medically necessary. The treatment options including medical management, injection therapy  arthroscopy and arthroplasty were discussed at length. The risks and benefits of total knee arthroplasty were presented and reviewed. The risks due  to aseptic loosening, infection, stiffness, patella tracking problems, thromboembolic complications and other imponderables were discussed. The patient acknowledged the explanation, agreed to proceed with the plan and consent was signed. Patient is being admitted for treatment for surgery, pain control, PT, OT, prophylactic antibiotics, VTE prophylaxis, progressive ambulation and ADL's and discharge planning. The patient is planning to be discharged home.     Patient's anticipated LOS is less than 2 midnights, meeting these requirements: - Lives within 1 hour of care - Has a competent adult at home to recover with post-op recover - NO history of  - Chronic pain requiring opiods  - Diabetes  - Heart failure  - Heart attack  - Stroke  - DVT/VTE  - Cardiac arrhythmia  - Respiratory Failure/COPD  - Renal failure  - Anemia  - Advanced Liver disease

## 2020-05-18 ENCOUNTER — Ambulatory Visit (HOSPITAL_COMMUNITY)
Admission: RE | Admit: 2020-05-18 | Discharge: 2020-05-18 | Disposition: A | Discharge status: Home / Self Care | Payer: Medicare Other | Source: Ambulatory Visit | Attending: Internal Medicine | Admitting: Internal Medicine

## 2020-05-18 ENCOUNTER — Other Ambulatory Visit (HOSPITAL_COMMUNITY): Payer: Self-pay | Admitting: Physician Assistant

## 2020-05-18 ENCOUNTER — Other Ambulatory Visit: Payer: Self-pay

## 2020-05-18 DIAGNOSIS — I6521 Occlusion and stenosis of right carotid artery: Secondary | ICD-10-CM | POA: Diagnosis not present

## 2020-05-18 DIAGNOSIS — I779 Disorder of arteries and arterioles, unspecified: Secondary | ICD-10-CM | POA: Diagnosis present

## 2020-05-19 ENCOUNTER — Ambulatory Visit: Payer: Medicare Other | Admitting: Internal Medicine

## 2020-05-19 ENCOUNTER — Ambulatory Visit (INDEPENDENT_AMBULATORY_CARE_PROVIDER_SITE_OTHER): Payer: Medicare Other

## 2020-05-19 ENCOUNTER — Encounter: Payer: Self-pay | Admitting: Internal Medicine

## 2020-05-19 VITALS — BP 138/82 | HR 67 | Temp 97.9°F | Ht 72.0 in | Wt 197.4 lb

## 2020-05-19 DIAGNOSIS — K219 Gastro-esophageal reflux disease without esophagitis: Secondary | ICD-10-CM

## 2020-05-19 DIAGNOSIS — M4810 Ankylosing hyperostosis [Forestier], site unspecified: Secondary | ICD-10-CM

## 2020-05-19 DIAGNOSIS — R0600 Dyspnea, unspecified: Secondary | ICD-10-CM

## 2020-05-19 DIAGNOSIS — Z01818 Encounter for other preprocedural examination: Secondary | ICD-10-CM | POA: Diagnosis not present

## 2020-05-19 DIAGNOSIS — R0609 Other forms of dyspnea: Secondary | ICD-10-CM

## 2020-05-19 DIAGNOSIS — E559 Vitamin D deficiency, unspecified: Secondary | ICD-10-CM | POA: Diagnosis not present

## 2020-05-19 DIAGNOSIS — I6523 Occlusion and stenosis of bilateral carotid arteries: Secondary | ICD-10-CM

## 2020-05-19 DIAGNOSIS — R7303 Prediabetes: Secondary | ICD-10-CM | POA: Diagnosis not present

## 2020-05-19 DIAGNOSIS — I10 Essential (primary) hypertension: Secondary | ICD-10-CM

## 2020-05-19 DIAGNOSIS — C61 Malignant neoplasm of prostate: Secondary | ICD-10-CM | POA: Diagnosis not present

## 2020-05-19 DIAGNOSIS — E785 Hyperlipidemia, unspecified: Secondary | ICD-10-CM

## 2020-05-19 LAB — COMPREHENSIVE METABOLIC PANEL
ALT: 22 U/L (ref 0–53)
AST: 23 U/L (ref 0–37)
Albumin: 4.3 g/dL (ref 3.5–5.2)
Alkaline Phosphatase: 116 U/L (ref 39–117)
BUN: 18 mg/dL (ref 6–23)
CO2: 26 mEq/L (ref 19–32)
Calcium: 10.5 mg/dL (ref 8.4–10.5)
Chloride: 108 mEq/L (ref 96–112)
Creatinine, Ser: 0.99 mg/dL (ref 0.40–1.50)
GFR: 72.65 mL/min (ref >60.00)
Glucose, Bld: 94 mg/dL (ref 70–99)
Potassium: 4 mEq/L (ref 3.5–5.1)
Sodium: 142 mEq/L (ref 135–145)
Total Bilirubin: 0.8 mg/dL (ref 0.2–1.2)
Total Protein: 7.3 g/dL (ref 6.0–8.3)

## 2020-05-19 LAB — CBC WITH DIFFERENTIAL/PLATELET
Basophils Absolute: 0.1 10*3/uL (ref 0.0–0.1)
Basophils Relative: 0.7 % (ref 0.0–3.0)
Eosinophils Absolute: 0.2 10*3/uL (ref 0.0–0.7)
Eosinophils Relative: 3.2 % (ref 0.0–5.0)
HCT: 43.1 % (ref 39.0–52.0)
Hemoglobin: 14.5 g/dL (ref 13.0–17.0)
Lymphocytes Relative: 26.6 % (ref 12.0–46.0)
Lymphs Abs: 1.8 10*3/uL (ref 0.7–4.0)
MCHC: 33.7 g/dL (ref 30.0–36.0)
MCV: 92.4 fl (ref 78.0–100.0)
Monocytes Absolute: 0.6 10*3/uL (ref 0.1–1.0)
Monocytes Relative: 8.6 % (ref 3.0–12.0)
Neutro Abs: 4.1 10*3/uL (ref 1.4–7.7)
Neutrophils Relative %: 60.9 % (ref 43.0–77.0)
Platelets: 197 10*3/uL (ref 150.0–400.0)
RBC: 4.66 Mil/uL (ref 4.22–5.81)
RDW: 14.7 % (ref 11.5–15.5)
WBC: 6.7 10*3/uL (ref 4.0–10.5)

## 2020-05-19 LAB — URINALYSIS
Bilirubin Urine: NEGATIVE
Ketones, ur: NEGATIVE
Leukocytes,Ua: NEGATIVE
Nitrite: NEGATIVE
Specific Gravity, Urine: 1.025 (ref 1.000–1.030)
Total Protein, Urine: NEGATIVE
Urine Glucose: NEGATIVE
Urobilinogen, UA: 0.2 (ref 0.0–1.0)
pH: 5.5 (ref 5.0–8.0)

## 2020-05-19 LAB — HEMOGLOBIN A1C: Hgb A1c MFr Bld: 6.1 % (ref 4.6–6.5)

## 2020-05-19 LAB — PROTIME-INR
INR: 1.1 ratio — ABNORMAL HIGH (ref 0.8–1.0)
Prothrombin Time: 12.5 s (ref 9.6–13.1)

## 2020-05-19 NOTE — Assessment & Plan Note (Addendum)
No change, chronic and multifactorial.

## 2020-05-19 NOTE — Patient Instructions (Signed)
You can try [employer]'s Mane Mushroom capsules for memory, focus, neuropathy ( [email or web address])    

## 2020-05-19 NOTE — Assessment & Plan Note (Signed)
On Lipitor 

## 2020-05-19 NOTE — Progress Notes (Signed)
Subjective:  Patient ID: David [name], male    DOB: 05/30/1940  Age: 80 y.o. MRN: [id number]  CC: Medical Clearance ((L) Total Knee arthoplasty)   HPI David [name] presents for pre-op consult Req by dr [name] Reason L TKR; spinal block Hx: The patient has GERD, hypertension, hyperlipidemia, arthritis, DOE -all stable    Past Medical History:  Diagnosis Date  . Allergy   . Arthritis    gen.  and left hip  . BPH (benign prostatic hypertrophy)   . GERD (gastroesophageal reflux disease)    occasional  . History of kidney stones    2007  . Hyperlipidemia   . Hypertension   . Kidney tumor   . LBP (low back pain) 06/2005   L5 radicular symptoms; MRI of LS spine severe spondylosis at L4-L5 with central cancal stenosis   . Nocturia   . Organic impotence   . Prostate cancer ([place]) 10/2011   (Low grade) [facility] - Dr. Junious Silk  . Pyelonephritis 03/05/2013  . Renal oncocytoma of right kidney 01/23/2013   As 3.2 x 3.5 cm enhancing exophytic mass projecting off the lower pole. This is consistent with a renal cell carcinoma. No retroperitoneal lymphadenopathy.   [name] Spermatocele   . 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 Wears glasses    Past Surgical History:  Procedure Laterality Date  . COLONOSCOPY  02/15/2006, 2014   Internal hemorrhoids (Dr [name]), last in 2014  . INGUINAL HERNIA REPAIR Right 1980  . LYMPHADENECTOMY Bilateral 02/15/2013   Procedure: LYMPHADENECTOMY WITH INDOCYANINE GREEN DYE INJECTION;  Surgeon: [name], MD;  Location: [facility];  Service: Urology;  Laterality: Bilateral;  . PROSTATE BIOPSY N/A 12/18/2012   Procedure: BIOPSY TRANSRECTAL ULTRASONIC PROSTATE (TUBP);  Surgeon: [name], MD;  Location: [facility];  Service: Urology;  Laterality: N/A;  . PROSTATE BIOPSY  12/05/11   gleason 6, 3/12 cores  . REPAIR UMBILICAL AND VENTRAL HERNIA'S W/ MESH  08-21-2009  . RIGHT/LEFT HEART CATH AND CORONARY ANGIOGRAPHY N/A 08/06/2019    Procedure: RIGHT/LEFT HEART CATH AND CORONARY ANGIOGRAPHY;  Surgeon: [name], Peter M, MD;  Location: [place] [facility];  Service: Cardiovascular;  Laterality: N/A;  . ROBOT ASSISTED LAPAROSCOPIC RADICAL PROSTATECTOMY N/A 02/15/2013   Procedure: ROBOTIC ASSISTED LAPAROSCOPIC RADICAL PROSTATECTOMY;  Surgeon: [name], MD;  Location: [facility];  Service: Urology;  Laterality: N/A;  . ROBOTIC ASSITED PARTIAL NEPHRECTOMY Right 02/15/2013   Procedure: ROBOTIC ASSITED PARTIAL NEPHRECTOMY;  Surgeon: [name], MD;  Location: [facility];  Service: Urology;  Laterality: Right;  . SPERMATOCELECTOMY Left 12/18/2012   Procedure: LEFT SPERMATOCELECTOMY;  Surgeon: [name], MD;  Location[facility]  Service: Urology;  Laterality: Left;  . TONSILLECTOMY AND ADENOIDECTOMY  as child  . TRANSURETHRAL RESECTION OF PROSTATE  AGE 42    reports that he quit smoking about 39 years ago. His smoking use included cigarettes. He has a 2.50 pack-year smoking history. He has never used smokeless tobacco. He reports current alcohol use of about 1.0 standard drink of alcohol per week. He reports previous drug use. Drug: Flunitrazepam. family history includes Cancer in his mother; Colon cancer (age of onset: 11) in his mother; Coronary artery disease in an other family member; Hyperlipidemia in an other family member; Pulmonary fibrosis in his brother and father. Allergies  Allergen Reactions  . Codeine Itching    Severe hiccups  . Ace Inhibitors Other (See Comments)    REACTION: Cough  . Amlodipine  LE swelling  . Percocet [Oxycodone-Acetaminophen]     Severe hiccups    Outpatient Medications Prior to Visit  Medication Sig Dispense Refill  . aspirin EC 81 MG tablet Take 81 mg by mouth every evening.     [name] atorvastatin (LIPITOR) 80 MG tablet TAKE 1 TABLET(80 MG) BY MOUTH DAILY AT 6 PM (Patient taking differently: Take 80 mg by mouth daily.) 90 tablet 3  . cetirizine (ZYRTEC) 10 MG  tablet Take 1 tablet (10 mg total) by mouth daily. (Patient taking differently: Take 10 mg by mouth daily as needed for allergies.) 90 tablet 2  . DULoxetine (CYMBALTA) 30 MG capsule TAKE ONE CAPSULE BY MOUTH DAILY (Patient taking differently: Take 30 mg by mouth daily.) 30 capsule 5  . fluticasone (FLONASE) 50 MCG/ACT nasal spray SHAKE LIQUID AND USE 2 SPRAYS IN EACH NOSTRIL DAILY (Patient taking differently: Place 2 sprays into both nostrils daily as needed for allergies.) 48 g 1  . Iodoquinol-HC (HYDROCORTISONE-IODOQUINOL) 1-1 % CREA Apply 1 application topically daily as needed (Rash on perineal).   2  . olmesartan (BENICAR) 20 MG tablet TAKE 1 TABLET(20 MG) BY MOUTH DAILY (Patient taking differently: Take 20 mg by mouth daily.) 90 tablet 3  . pantoprazole (PROTONIX) 40 MG tablet Take 1 tablet (40 mg total) by mouth 2 (two) times daily. Annual appt due in June must see provider for future refills 60 tablet 3  . sodium chloride (OCEAN) 0.65 % nasal spray Place 1 spray into the nose daily as needed for congestion.    . Vitamin D, Cholecalciferol, 50 MCG (2000 UT) CAPS Take 2,000 Units by mouth daily.      No facility-administered medications prior to visit.    ROS: Review of Systems  Constitutional: Negative for appetite change, fatigue and unexpected weight change.  HENT: Negative for congestion, nosebleeds, sneezing, sore throat and trouble swallowing.   Eyes: Negative for itching and visual disturbance.  Respiratory: Positive for shortness of breath. Negative for cough and chest tightness.   Cardiovascular: Negative for chest pain, palpitations and leg swelling.  Gastrointestinal: Negative for abdominal distention, blood in stool, diarrhea and nausea.  Genitourinary: Negative for frequency and hematuria.  Musculoskeletal: Positive for arthralgias and gait problem. Negative for back pain, joint swelling and neck pain.  Skin: Negative for rash.  Neurological: Negative for dizziness,  tremors, speech difficulty and weakness.  Psychiatric/Behavioral: Positive for decreased concentration. Negative for agitation, dysphoric mood, sleep disturbance and suicidal ideas. The patient is not nervous/anxious.     Objective:  BP 138/82 (BP Location: Left Arm)   Pulse 67   Temp 97.9 F (36.6 C) (Oral)   Ht 6' (1.829 m)   Wt 197 lb 6.4 oz (89.5 kg)   SpO2 98%   BMI 26.77 kg/m   BP Readings from Last 3 Encounters:  05/19/20 138/82  05/12/20 128/76  09/23/19 (!) 118/60    Wt Readings from Last 3 Encounters:  05/19/20 197 lb 6.4 oz (89.5 kg)  05/12/20 198 lb 3.2 oz (89.9 kg)  09/23/19 (!) 204 lb 3.2 oz (92.6 kg)    Physical Exam Constitutional:      General: He is not in acute distress.    Appearance: He is well-developed.     Comments: NAD  Eyes:     Conjunctiva/sclera: Conjunctivae normal.     Pupils: Pupils are equal, round, and reactive to light.  Neck:     Thyroid: No thyromegaly.     Vascular: No JVD.  Cardiovascular:  Rate and Rhythm: Normal rate and regular rhythm.     Heart sounds: Normal heart sounds. No murmur heard. No friction rub. No gallop.   Pulmonary:     Effort: Pulmonary effort is normal. No respiratory distress.     Breath sounds: Normal breath sounds. No wheezing or rales.  Chest:     Chest wall: No tenderness.  Abdominal:     General: Bowel sounds are normal. There is no distension.     Palpations: Abdomen is soft. There is no mass.     Tenderness: There is no abdominal tenderness. There is no guarding or rebound.  Musculoskeletal:        General: No tenderness. Normal range of motion.     Cervical back: Normal range of motion.  Lymphadenopathy:     Cervical: No cervical adenopathy.  Skin:    General: Skin is warm and dry.     Findings: No rash.  Neurological:     Mental Status: He is alert and oriented to person, place, and time.     Cranial Nerves: No cranial nerve deficit.     Motor: No abnormal muscle tone.      Coordination: Coordination normal.     Gait: Gait normal.     Deep Tendon Reflexes: Reflexes are normal and symmetric.  Psychiatric:        Behavior: Behavior normal.        Thought Content: Thought content normal.        Judgment: Judgment normal.   Left knee is painful with range of motion Neck is stiff, kyphotic thoracic spine.  He is not able to lay down on the exam table and needs to be propped up  Lab Results  Component Value Date   WBC 6.7 05/19/2020   HGB 14.5 05/19/2020   HCT 43.1 05/19/2020   PLT 197.0 05/19/2020   GLUCOSE 94 05/19/2020   CHOL 151 05/12/2020   TRIG 77 05/12/2020   HDL 47 05/12/2020   LDLDIRECT 122.1 03/05/2012   LDLCALC 89 05/12/2020   ALT 22 05/19/2020   AST 23 05/19/2020   NA 142 05/19/2020   K 4.0 05/19/2020   CL 108 05/19/2020   CREATININE 0.99 05/19/2020   BUN 18 05/19/2020   CO2 26 05/19/2020   TSH 1.42 05/30/2019   PSA 0.047 04/08/2017   INR 1.1 (H) 05/19/2020   HGBA1C 6.1 05/19/2020   3.22.22 EXAM: CHEST - 2 VIEW  COMPARISON:  Radiograph 05/30/2019 CT 06/07/2019  FINDINGS: The heart is normal in size. Aortic tortuosity. There is a moderate retrocardiac hiatal hernia, unchanged from prior exam. Minimal scarring in the lingula. No acute airspace disease. No pleural fluid or pneumothorax. Flowing anterior syndesmophytes throughout the thoracic spine most consistent with DISH. No acute osseous abnormalities are seen.  IMPRESSION: 1. No acute findings. 2. Moderate hiatal hernia.   Electronically Signed   By: Keith Rake M.D.   On: 05/19/2020 18:31 VAS US CAROTID  Result Date: 05/18/2020 Carotid Arterial Duplex Study Indications:       Carotid artery disease and Patient denies any cerebrovascular                    symptoms today. Risk Factors:      Hypertension, hyperlipidemia, past history of smoking. Comparison Study:  Prior carotid duplex exam on 02/04/2019 showed highest                    velocities in right  proximal ICA 163/61  cm/s and left mid ICA                    58/14 cm/s. Performing Technologist: Salvadore Dom RVT, RDCS (AE), RDMS  Examination Guidelines: A complete evaluation includes B-mode imaging, spectral Doppler, color Doppler, and power Doppler as needed of all accessible portions of each vessel. Bilateral testing is considered an integral part of a complete examination. Limited examinations for reoccurring indications may be performed as noted.  Right Carotid Findings: +----------+--------+--------+--------+--------------------+-------------------+           PSV cm/sEDV cm/sStenosisPlaque Description  Comments            +----------+--------+--------+--------+--------------------+-------------------+ CCA Prox  125     17                                                      +----------+--------+--------+--------+--------------------+-------------------+ CCA Distal79      22                                                      +----------+--------+--------+--------+--------------------+-------------------+ ICA Prox  212     67      60-79%  irregular,          low end range                                         heterogenous and                                                          calcific                                +----------+--------+--------+--------+--------------------+-------------------+ ICA Mid   69      24                                  post stenotic                                                             turbulence          +----------+--------+--------+--------+--------------------+-------------------+ ICA Distal64      32                                                      +----------+--------+--------+--------+--------------------+-------------------+ ECA       118     14                                                      +----------+--------+--------+--------+--------------------+-------------------+  +----------+--------+-------+----------------+-------------------+  PSV cm/sEDV cmsDescribe        Arm Pressure (mmHG) +----------+--------+-------+----------------+-------------------+ Subclavian216            Multiphasic, [id number]                 +----------+--------+-------+----------------+-------------------+ +---------+--------+--+--------+-+---------+ VertebralPSV cm/s41EDV cm/s7Antegrade +---------+--------+--+--------+-+---------+  Left Carotid Findings: +----------+--------+--------+--------+------------------+--------+           PSV cm/sEDV cm/sStenosisPlaque DescriptionComments +----------+--------+--------+--------+------------------+--------+ CCA Prox  103     13                                         +----------+--------+--------+--------+------------------+--------+ CCA Distal56      14                                         +----------+--------+--------+--------+------------------+--------+ ICA Prox  44      13                                         +----------+--------+--------+--------+------------------+--------+ ICA Mid   61      18                                         +----------+--------+--------+--------+------------------+--------+ ICA Distal50      17                                         +----------+--------+--------+--------+------------------+--------+ ECA       132     10              focal and calcific         +----------+--------+--------+--------+------------------+--------+ +----------+--------+--------+----------------+-------------------+           PSV cm/sEDV cm/sDescribe        Arm Pressure (mmHG) +----------+--------+--------+----------------+-------------------+ [id number]             Multiphasic, [id number]                 +----------+--------+--------+----------------+-------------------+ +---------+--------+--+--------+--+---------+ VertebralPSV cm/s45EDV cm/s15Antegrade  +---------+--------+--+--------+--+---------+   Summary: Right Carotid: Velocities in the right ICA are now consistent with a low end                range 60-79% stenosis. The ICA velocities have increased since                prior exam. Left Carotid: The extracranial vessels were near-normal with only minimal wall               thickening or plaque. Vertebrals:  Bilateral vertebral arteries demonstrate antegrade flow. Subclavians: Normal flow hemodynamics were seen in bilateral subclavian              arteries. *See table(s) above for measurements and observations. Suggest follow up study in 12 months.    Preliminary     Assessment & Plan:

## 2020-05-19 NOTE — Assessment & Plan Note (Signed)
Check A1c. 

## 2020-05-19 NOTE — Assessment & Plan Note (Signed)
Doing well 

## 2020-05-19 NOTE — Telephone Encounter (Signed)
Pt had called back made appt for today 05/19/20.David KitchenJohny Chess

## 2020-05-19 NOTE — Assessment & Plan Note (Addendum)
2022 Right Carotid: Velocities in the right ICA are now consistent with a low end range 60-79% stenosis. The ICA velocities have increased since prior exam.  On Lipitor, ASA No sx's

## 2020-05-19 NOTE — Assessment & Plan Note (Signed)
On Vit D 

## 2020-05-20 ENCOUNTER — Telehealth: Payer: Self-pay | Admitting: Internal Medicine

## 2020-05-20 DIAGNOSIS — Z01818 Encounter for other preprocedural examination: Secondary | ICD-10-CM | POA: Insufficient documentation

## 2020-05-20 DIAGNOSIS — M4810 Ankylosing hyperostosis [Forestier], site unspecified: Secondary | ICD-10-CM | POA: Insufficient documentation

## 2020-05-20 NOTE — Assessment & Plan Note (Addendum)
There is a moderate size hiatal hernia on his chest x-ray.  Continue with pantoprazole

## 2020-05-20 NOTE — Patient Instructions (Addendum)
DUE TO COVID-19 ONLY ONE VISITOR IS ALLOWED TO COME WITH YOU AND STAY IN THE WAITING ROOM ONLY DURING PRE OP AND PROCEDURE DAY OF SURGERY. THE 1 VISITOR  MAY VISIT WITH YOU AFTER SURGERY IN YOUR PRIVATE ROOM DURING VISITING HOURS ONLY!  YOU NEED TO HAVE A COVID 19 TEST ON: 05/22/20 @ 10:30 AM , THIS TEST MUST BE DONE BEFORE SURGERY,  COVID TESTING SITE [place] [id number], IT IS ON THE RIGHT GOING OUT [address] APPROXIMATELY  2 MINUTES PAST [employer] ON THE RIGHT. ONCE YOUR COVID TEST IS COMPLETED,  PLEASE BEGIN THE QUARANTINE INSTRUCTIONS AS OUTLINED IN YOUR HANDOUT.                [name]    Your procedure is scheduled on: 05/26/20   Report to [facility] Main  Entrance   Report to admitting at: 11:50 AM     Call this number if you have problems the morning of surgery [phone]    Remember:  NO SOLID FOOD AFTER MIDNIGHT THE NIGHT PRIOR TO SURGERY. NOTHING BY MOUTH EXCEPT CLEAR LIQUIDS UNTIL: 11:20 AM . PLEASE FINISH ENSURE DRINK PER SURGEON ORDER  WHICH NEEDS TO BE COMPLETED AT: 11:20 AM .  CLEAR LIQUID DIET  Foods Allowed                                                                     Foods Excluded  Coffee and tea, regular and decaf                             liquids that you cannot  Plain Jell-O any favor except red or purple                                           see through such as: Fruit ices (not with fruit pulp)                                     milk, soups, orange juice  Iced Popsicles                                    All solid food Carbonated beverages, regular and diet                                    Cranberry, grape and apple juices Sports drinks like Gatorade Lightly seasoned clear broth or consume(fat free) Sugar, honey syrup  Sample Menu Breakfast                                Lunch  Supper Cranberry juice                    Beef broth                             Chicken broth Jell-O                                     Grape juice                           Apple juice Coffee or tea                        Jell-O                                      Popsicle                                                Coffee or tea                        Coffee or tea  _____________________________________________________________________   BRUSH YOUR TEETH MORNING OF SURGERY AND RINSE YOUR MOUTH OUT, NO CHEWING GUM CANDY OR MINTS.    Take these medicines the morning of surgery with A SIP OF WATER: duloxetine,pantoprazole.Use flonase as usual.Cetirizine as needed.                               You may not have any metal on your body including hair pins and              piercings  Do not wear jewelry, lotions, powders or perfumes, deodorant             Men may shave face and neck.   Do not bring valuables to the hospital. [place].  Contacts, dentures or bridgework may not be worn into surgery.  Leave suitcase in the car. After surgery it may be brought to your room.     Patients discharged the day of surgery will not be allowed to drive home. IF YOU ARE HAVING SURGERY AND GOING HOME THE SAME DAY, YOU MUST HAVE AN ADULT TO DRIVE YOU HOME AND BE WITH YOU FOR 24 HOURS. YOU MAY GO HOME BY TAXI OR UBER OR ORTHERWISE, BUT AN ADULT MUST ACCOMPANY YOU HOME AND STAY WITH YOU FOR 24 HOURS.  Name and phone number of your driver:  Special Instructions: N/A              Please read over the following fact sheets you were given: _____________________________________________________________________        [facility] - Preparing for Surgery Before surgery, you can play an important role.  Because skin is not sterile, your skin needs to be as free of germs as possible.  You can reduce the number of germs on your skin by washing  with CHG (chlorahexidine gluconate) soap before surgery.  CHG is an antiseptic cleaner which  kills germs and bonds with the skin to continue killing germs even after washing. Please DO NOT use if you have an allergy to CHG or antibacterial soaps.  If your skin becomes reddened/irritated stop using the CHG and inform your nurse when you arrive at Short Stay. Do not shave (including legs and underarms) for at least 48 hours prior to the first CHG shower.  You may shave your face/neck. Please follow these instructions carefully:  1.  Shower with CHG Soap the night before surgery and the  morning of Surgery.  2.  If you choose to wash your hair, wash your hair first as usual with your  normal  shampoo.  3.  After you shampoo, rinse your hair and body thoroughly to remove the  shampoo.                           4.  Use CHG as you would any other liquid soap.  You can apply chg directly  to the skin and wash                       Gently with a scrungie or clean washcloth.  5.  Apply the CHG Soap to your body ONLY FROM THE NECK DOWN.   Do not use on face/ open                           Wound or open sores. Avoid contact with eyes, ears mouth and genitals (private parts).                       Wash face,  Genitals (private parts) with your normal soap.             6.  Wash thoroughly, paying special attention to the area where your surgery  will be performed.  7.  Thoroughly rinse your body with warm water from the neck down.  8.  DO NOT shower/wash with your normal soap after using and rinsing off  the CHG Soap.                9.  Pat yourself dry with a clean towel.            10.  Wear clean pajamas.            11.  Place clean sheets on your bed the night of your first shower and do not  sleep with pets. Day of Surgery : Do not apply any lotions/deodorants the morning of surgery.  Please wear clean clothes to the hospital/surgery center.  FAILURE TO FOLLOW THESE INSTRUCTIONS MAY RESULT IN THE CANCELLATION OF YOUR SURGERY PATIENT SIGNATURE_________________________________  NURSE  SIGNATURE__________________________________  ________________________________________________________________________   Adam Phenix  An incentive spirometer is a tool that can help keep your lungs clear and active. This tool measures how well you are filling your lungs with each breath. Taking long deep breaths may help reverse or decrease the chance of developing breathing (pulmonary) problems (especially infection) following:  A long period of time when you are unable to move or be active. BEFORE THE PROCEDURE   If the spirometer includes an indicator to show your best effort, your nurse or respiratory therapist will set it to a desired goal.  If possible, sit up straight  or lean slightly forward. Try not to slouch.  Hold the incentive spirometer in an upright position. INSTRUCTIONS FOR USE  1. Sit on the edge of your bed if possible, or sit up as far as you can in bed or on a chair. 2. Hold the incentive spirometer in an upright position. 3. Breathe out normally. 4. Place the mouthpiece in your mouth and seal your lips tightly around it. 5. Breathe in slowly and as deeply as possible, raising the piston or the ball toward the top of the column. 6. Hold your breath for 3-5 seconds or for as long as possible. Allow the piston or ball to fall to the bottom of the column. 7. Remove the mouthpiece from your mouth and breathe out normally. 8. Rest for a few seconds and repeat Steps 1 through 7 at least 10 times every 1-2 hours when you are awake. Take your time and take a few normal breaths between deep breaths. 9. The spirometer may include an indicator to show your best effort. Use the indicator as a goal to work toward during each repetition. 10. After each set of 10 deep breaths, practice coughing to be sure your lungs are clear. If you have an incision (the cut made at the time of surgery), support your incision when coughing by placing a pillow or rolled up towels firmly  against it. Once you are able to get out of bed, walk around indoors and cough well. You may stop using the incentive spirometer when instructed by your caregiver.  RISKS AND COMPLICATIONS  Take your time so you do not get dizzy or light-headed.  If you are in pain, you may need to take or ask for pain medication before doing incentive spirometry. It is harder to take a deep breath if you are having pain. AFTER USE  Rest and breathe slowly and easily.  It can be helpful to keep track of a log of your progress. Your caregiver can provide you with a simple table to help with this. If you are using the spirometer at home, follow these instructions: [place] IF:   You are having difficultly using the spirometer.  You have trouble using the spirometer as often as instructed.  Your pain medication is not giving enough relief while using the spirometer.  You develop fever of 100.5 F (38.1 C) or higher. SEEK IMMEDIATE MEDICAL CARE IF:   You cough up bloody sputum that had not been present before.  You develop fever of 102 F (38.9 C) or greater.  You develop worsening pain at or near the incision site. MAKE SURE YOU:   Understand these instructions.  Will watch your condition.  Will get help right away if you are not doing well or get worse. Document Released: 06/27/2006 Document Revised: 05/09/2011 Document Reviewed: 08/28/2006 [facility] Patient Information 2014 [place], [place].   ________________________________________________________________________

## 2020-05-20 NOTE — Telephone Encounter (Signed)
Please refax the surgery clearance form. Only received half of page  Fax to: [phone]  Attn: Venida Jarvis

## 2020-05-20 NOTE — Telephone Encounter (Signed)
Refaxed clearance to [facility].Marland KitchenJohny [name]

## 2020-05-20 NOTE — Assessment & Plan Note (Signed)
Continue with [place]

## 2020-05-20 NOTE — Assessment & Plan Note (Signed)
Thoracic spine.  Please consider to position him carefully and prop up on the table for surgery

## 2020-05-20 NOTE — Assessment & Plan Note (Signed)
The patient is medically clear for his knee surgery.  He has a thoracic spine DISH and a moderate size hiatal hernia.  Please consider to position him carefully and prop up on the table for surgery due to his stiff neck and stiff kyphotic thoracic spine.  Thank you,

## 2020-05-21 ENCOUNTER — Other Ambulatory Visit: Payer: Self-pay

## 2020-05-21 ENCOUNTER — Encounter (HOSPITAL_COMMUNITY): Payer: Self-pay

## 2020-05-21 ENCOUNTER — Encounter (HOSPITAL_COMMUNITY)
Admission: RE | Admit: 2020-05-21 | Discharge: 2020-05-21 | Disposition: A | Discharge status: Home / Self Care | Payer: Medicare Other | Source: Ambulatory Visit | Attending: Orthopedic Surgery | Admitting: Orthopedic Surgery

## 2020-05-21 DIAGNOSIS — Z01812 Encounter for preprocedural laboratory examination: Secondary | ICD-10-CM | POA: Diagnosis present

## 2020-05-21 HISTORY — DX: Atherosclerotic heart disease of native coronary artery without angina pectoris: I25.10

## 2020-05-21 LAB — SURGICAL PCR SCREEN
MRSA, PCR: NEGATIVE
Staphylococcus aureus: NEGATIVE

## 2020-05-21 NOTE — Progress Notes (Signed)
COVID Vaccine Completed: Yes Date COVID Vaccine completed: 03/2020 [place] COVID vaccine manufacturer:    Moderna    PCP - Dr. Aleda Grana Plotnikov. LOV: 05/19/20. : Clearance.: EPIC Cardiologist - Dr. [name]: LOV: 05/12/20  Chest x-ray - 05/19/20 EKG - 05/12/20 Stress Test -  ECHO - 06/13/19 Cardiac Cath - 08/06/19 Pacemaker/ICD device last checked: A1-C: 6.1: 05/19/20 Sleep Study -  CPAP -   Fasting Blood Sugar -  Checks Blood Sugar _____ times a day  Blood Thinner Instructions: Aspirin Instructions: To hold a week before. Last Dose: 05/19/20  Anesthesia review: Hx: HTN,CAD  Patient denies shortness of breath, fever, cough and chest pain at PAT appointment   Patient verbalized understanding of instructions that were given to them at the PAT appointment. Patient was also instructed that they will need to review over the PAT instructions again at home before surgery.

## 2020-05-22 ENCOUNTER — Other Ambulatory Visit (HOSPITAL_COMMUNITY)
Admission: RE | Admit: 2020-05-22 | Discharge: 2020-05-22 | Disposition: A | Discharge status: Home / Self Care | Payer: Medicare Other | Source: Ambulatory Visit | Attending: Orthopedic Surgery | Admitting: Orthopedic Surgery

## 2020-05-22 DIAGNOSIS — Z20822 Contact with and (suspected) exposure to covid-19: Secondary | ICD-10-CM | POA: Insufficient documentation

## 2020-05-22 DIAGNOSIS — Z01812 Encounter for preprocedural laboratory examination: Secondary | ICD-10-CM | POA: Diagnosis present

## 2020-05-22 LAB — SARS CORONAVIRUS 2 (TAT 6-24 HRS): SARS Coronavirus 2: NEGATIVE

## 2020-05-22 NOTE — Anesthesia Preprocedure Evaluation (Addendum)
Anesthesia Evaluation  Patient identified by MRN, date of birth, ID band Patient awake    Reviewed: Allergy & Precautions, H&P , NPO status , Patient's Chart, lab work & pertinent test results, reviewed documented beta blocker date and time   Airway Mallampati: II  TM Distance: >3 FB Neck ROM: full    Dental no notable dental hx.    Pulmonary neg pulmonary ROS, former smoker,    Pulmonary exam normal breath sounds clear to auscultation       Cardiovascular Exercise Tolerance: Good hypertension, + CAD and + DOE   Rhythm:regular Rate:Normal  EKG: 05/12/2020 Rate 72 bpm  Sinus rhythm with premature atrial complexes Nonspecific intraventricular block   Cardiac Cath 10/31/2353 LV end diastolic pressure is normal.  Mild nonobstructive CAD with coronary calcification Normal right heart pressures  Low LV filling pressures 4Normal cardiac output. No shunt.  Echo 06/13/2019  Left ventricular ejection fraction, by estimation, is 60 to 65%. The  left ventricle has normal function. The left ventricle has no regional  wall motion abnormalities. Left ventricular diastolic parameters are  consistent with Grade I diastolic  dysfunction (impaired relaxation).    Stress Test 07/26/2016 Blood pressure demonstrated a hypertensive response to exercise. Heart rate demonstrated an exaggerated response to exercise Overall, the patient's exercise capacity was mildly impaired. Duke Treadmill Score: low risk  Negative stress test without evidence of ischemia at given workload. Mildly impaired exercise tolerance. Hypertensive response to exercise.   Neuro/Psych negative neurological ROS  negative psych ROS   GI/Hepatic Neg liver ROS, GERD  ,  Endo/Other  negative endocrine ROS  Renal/GU Renal disease  negative genitourinary   Musculoskeletal  (+) Arthritis , Osteoarthritis,    Abdominal   Peds  Hematology  (+) Blood dyscrasia,  anemia ,   Anesthesia Other Findings   Reproductive/Obstetrics negative OB ROS                          Anesthesia Physical Anesthesia Plan  ASA: III  Anesthesia Plan: General   Post-op Pain Management:  Regional for Post-op pain   Induction:   PONV Risk Score and Plan: 2  Airway Management Planned: Nasal Cannula and Natural Airway  Additional Equipment: None  Intra-op Plan:   Post-operative Plan:   Informed Consent: I have reviewed the patients History and Physical, chart, labs and discussed the procedure including the risks, benefits and alternatives for the proposed anesthesia with the patient or authorized representative who has indicated his/her understanding and acceptance.     Dental Advisory Given  Plan Discussed with: CRNA and Anesthesiologist  Anesthesia Plan Comments: (See PAT note 05/21/2020, Konrad Felix, PA-C 80 y.o. former smoker with h/o HTN, GERD, CAD, prostate cancer, left knee OA scheduled for above procedure 05/26/20 with Dr. Paralee Cancel.     Pt seen by cardiology 05/12/2020 for preoperative evaluation.  Per OV note, "Preoperative evaluation: Upcoming knee surgery by Dr. [name]. Previous cardiac catheterization in June 2021 showed minimal plaque at best. He denies any recent chest discomfort or shortness of breath. He may hold aspirin for 5 to 7 days prior to the surgery and restart as soon as possible after the surgery. He is cleared to proceed with knee surgery as a low risk patient. Patient mentions he likely will require the same surgery on the opposite knee in the future. If the surgery on the contralateral knee happens within the next 54-month, he is cleared as well. If the surgery ended  up having beyond the next 45-month, our preop APP can potentially call the patient and clear him over the phone."  Pt seen by PCP 05/20/2020. Per OV note, "The patient is medically clear for his knee surgery. He has a thoracic spine DISH  and a moderate size hiatal hernia. Please consider to position him carefully and prop up on the table for surgery due to his stiff neck and stiff kyphotic thoracic spine."  DG Lumbar spine Five non-rib-bearing lumbar vertebrae. Mild to moderate dextroconvex thoracolumbar rotary scoliosis. Varying degrees of anterior and lateral spur formation in the lumbar and lower thoracic spine. Facet degenerative changes in the mid and lower lumbar spine. No fractures, pars defects or subluxations. Atheromatous arterial calcifications. Large amount of stool in the colon.)      Anesthesia Quick Evaluation

## 2020-05-22 NOTE — Progress Notes (Signed)
Anesthesia Chart Review   Case: [id number] Date/Time: 05/26/20 1405   Procedure: TOTAL KNEE ARTHROPLASTY (Left Knee) - 70 mins   Anesthesia type: Spinal   Pre-op diagnosis: Left knee osteoarthritis   [facility] ROOM 10 / [facility]   Surgeons: Paralee Cancel, MD      DISCUSSION:80 y.o. former smoker with h/o HTN, GERD, CAD, prostate cancer, left knee OA scheduled for above procedure 05/26/20 with Dr. Paralee Cancel.     Pt seen by cardiology 05/12/2020 for preoperative evaluation.  Per OV note, "Preoperative evaluation: Upcoming knee surgery by Dr. [name].  Previous cardiac catheterization in June 2021 showed minimal plaque at best.  He denies any recent chest discomfort or shortness of breath.  He may hold aspirin for 5 to 7 days prior to the surgery and restart as soon as possible after the surgery.  He is cleared to proceed with knee surgery as a low risk patient.  Patient mentions he likely will require the same surgery on the opposite knee in the future.  If the surgery on the contralateral knee happens within the next 33-month, he is cleared as well.  If the surgery ended up having beyond the next 53-month, our preop APP can potentially call the patient and clear him over the phone."  Pt seen by PCP 05/20/2020. Per OV note, "The patient is medically clear for his knee surgery.  He has a thoracic spine DISH and a moderate size hiatal hernia.  Please consider to position him carefully and prop up on the table for surgery due to his stiff neck and stiff kyphotic thoracic spine."  Anticipate pt can proceed with planned procedure barring acute status change.   VS: BP 134/72   Pulse (!) 57   Temp 37.1 C (Oral)   Ht 6' (1.829 m)   Wt 88.9 kg   SpO2 97%   BMI 26.58 kg/m   PROVIDERS: Plotnikov, [name], MD is PCP   [name], MD is Cardiologist   LABS: Labs reviewed: Acceptable for surgery. (all labs ordered are listed, but only abnormal results are displayed)  Labs Reviewed  SURGICAL  PCR SCREEN  TYPE AND SCREEN     IMAGES:   EKG: 05/12/2020 Rate 72 bpm  Sinus rhythm with premature atrial complexes Nonspecific intraventricular block   CV: Cardiac Cath 9/0/2409   LV end diastolic pressure is normal.   1. Mild nonobstructive CAD with coronary calcification 2. Normal right heart pressures 3. Low LV filling pressures 4. Normal cardiac output. No shunt.  Plan: consider other reasons for his dyspnea.    Echo 06/13/2019 IMPRESSIONS    1. Left ventricular ejection fraction, by estimation, is 60 to 65%. The  left ventricle has normal function. The left ventricle has no regional  wall motion abnormalities. Left ventricular diastolic parameters are  consistent with Grade I diastolic  dysfunction (impaired relaxation).  2. Right ventricular systolic function is normal. The right ventricular  size is normal.  3. Left atrial size was mildly dilated.  4. The mitral valve is normal in structure. No evidence of mitral valve  regurgitation. No evidence of mitral stenosis.  5. The aortic valve is grossly normal. Aortic valve regurgitation is  trivial. No aortic stenosis is present.  6. Aortic dilatation noted. There is mild dilatation of the aortic root  and of the ascending aorta measuring 42 and 43 mm.   Stress Test 07/26/2016   Blood pressure demonstrated a hypertensive response to exercise.  No T wave inversion was  noted during stress.  There was no ST segment deviation noted during stress.  The test was stopped because the patient complained of shortness of breath  Blood pressure demonstrated a hypertensive response to exercise. Heart rate demonstrated an exaggerated response to exercise  Overall, the patient's exercise capacity was mildly impaired.  Duke Treadmill Score: low risk   Negative stress test without evidence of ischemia at given workload. Mildly impaired exercise tolerance. Hypertensive response to exercise.  Past Medical  History:  Diagnosis Date  . Allergy   . Arthritis    gen.  and left hip  . BPH (benign prostatic hypertrophy)   . Coronary artery disease   . GERD (gastroesophageal reflux disease)    occasional  . History of kidney stones    2007  . Hyperlipidemia   . Hypertension   . Kidney tumor   . LBP (low back pain) 06/2005   L5 radicular symptoms; MRI of LS spine severe spondylosis at L4-L5 with central cancal stenosis   . Nocturia   . Organic impotence   . Prostate cancer ([place]) 10/2011   (Low grade) [facility] - Dr. Junious Silk  . Pyelonephritis 03/05/2013  . Renal oncocytoma of right kidney 01/23/2013   As 3.2 x 3.5 cm enhancing exophytic mass projecting off the lower pole. This is consistent with a renal cell carcinoma. No retroperitoneal lymphadenopathy.   [name] Spermatocele   . 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 Wears glasses     Past Surgical History:  Procedure Laterality Date  . COLONOSCOPY  02/15/2006, 2014   Internal hemorrhoids (Dr [name]), last in 2014  . INGUINAL HERNIA REPAIR Right 1980  . LYMPHADENECTOMY Bilateral 02/15/2013   Procedure: LYMPHADENECTOMY WITH INDOCYANINE GREEN DYE INJECTION;  Surgeon: [name], MD;  Location: [facility];  Service: Urology;  Laterality: Bilateral;  . PROSTATE BIOPSY N/A 12/18/2012   Procedure: BIOPSY TRANSRECTAL ULTRASONIC PROSTATE (TUBP);  Surgeon: [name], MD;  Location: [facility];  Service: Urology;  Laterality: N/A;  . PROSTATE BIOPSY  12/05/11   gleason 6, 3/12 cores  . REPAIR UMBILICAL AND VENTRAL HERNIA'S W/ MESH  08-21-2009  . RIGHT/LEFT HEART CATH AND CORONARY ANGIOGRAPHY N/A 08/06/2019   Procedure: RIGHT/LEFT HEART CATH AND CORONARY ANGIOGRAPHY;  Surgeon: [name], Peter M, MD;  Location: [facility];  Service: Cardiovascular;  Laterality: N/A;  . ROBOT ASSISTED LAPAROSCOPIC RADICAL PROSTATECTOMY N/A 02/15/2013   Procedure: ROBOTIC ASSISTED LAPAROSCOPIC RADICAL PROSTATECTOMY;  Surgeon: [name], MD;   Location: [facility];  Service: Urology;  Laterality: N/A;  . ROBOTIC ASSITED PARTIAL NEPHRECTOMY Right 02/15/2013   Procedure: ROBOTIC ASSITED PARTIAL NEPHRECTOMY;  Surgeon: [name], MD;  Location: [facility];  Service: Urology;  Laterality: Right;  . SPERMATOCELECTOMY Left 12/18/2012   Procedure: LEFT SPERMATOCELECTOMY;  Surgeon: [name], MD;  Location: [facility];  Service: Urology;  Laterality: Left;  . TONSILLECTOMY AND ADENOIDECTOMY  as child  . TRANSURETHRAL RESECTION OF PROSTATE  AGE 13    MEDICATIONS: . aspirin EC 81 MG tablet  . atorvastatin (LIPITOR) 80 MG tablet  . cetirizine (ZYRTEC) 10 MG tablet  . DULoxetine (CYMBALTA) 30 MG capsule  . fluticasone (FLONASE) 50 MCG/ACT nasal spray  . Iodoquinol-HC (HYDROCORTISONE-IODOQUINOL) 1-1 % CREA  . olmesartan (BENICAR) 20 MG tablet  . pantoprazole (PROTONIX) 40 MG tablet  . sodium chloride (OCEAN) 0.65 % nasal spray  . Vitamin D, Cholecalciferol, 50 MCG (2000 UT) CAPS   No current facility-administered medications for this encounter.   Janett Billow  Mayme Genta [facility] [phone]

## 2020-05-25 ENCOUNTER — Encounter: Payer: Self-pay | Admitting: *Deleted

## 2020-05-25 ENCOUNTER — Other Ambulatory Visit: Payer: Self-pay | Admitting: Family Medicine

## 2020-05-25 ENCOUNTER — Encounter (HOSPITAL_COMMUNITY): Payer: Self-pay | Admitting: Orthopedic Surgery

## 2020-05-25 DIAGNOSIS — M159 Polyosteoarthritis, unspecified: Secondary | ICD-10-CM

## 2020-05-26 ENCOUNTER — Ambulatory Visit (HOSPITAL_COMMUNITY): Payer: Medicare Other | Admitting: Anesthesiology

## 2020-05-26 ENCOUNTER — Encounter (HOSPITAL_COMMUNITY): Payer: Self-pay | Admitting: Orthopedic Surgery

## 2020-05-26 ENCOUNTER — Observation Stay (HOSPITAL_COMMUNITY)
Admission: RE | Admit: 2020-05-26 | Discharge: 2020-05-27 | Disposition: A | Discharge status: Home / Self Care | Payer: Medicare Other | Attending: Orthopedic Surgery | Admitting: Orthopedic Surgery

## 2020-05-26 ENCOUNTER — Other Ambulatory Visit: Payer: Self-pay

## 2020-05-26 ENCOUNTER — Encounter (HOSPITAL_COMMUNITY)
Admission: RE | Disposition: A | Discharge status: Home / Self Care | Payer: Self-pay | Source: Home / Self Care | Attending: Orthopedic Surgery

## 2020-05-26 ENCOUNTER — Other Ambulatory Visit: Payer: Self-pay | Admitting: *Deleted

## 2020-05-26 ENCOUNTER — Ambulatory Visit (HOSPITAL_COMMUNITY): Payer: Medicare Other | Admitting: Physician Assistant

## 2020-05-26 DIAGNOSIS — Z87891 Personal history of nicotine dependence: Secondary | ICD-10-CM | POA: Diagnosis not present

## 2020-05-26 DIAGNOSIS — M1712 Unilateral primary osteoarthritis, left knee: Principal | ICD-10-CM | POA: Insufficient documentation

## 2020-05-26 DIAGNOSIS — M159 Polyosteoarthritis, unspecified: Secondary | ICD-10-CM

## 2020-05-26 DIAGNOSIS — Z96652 Presence of left artificial knee joint: Secondary | ICD-10-CM

## 2020-05-26 DIAGNOSIS — Z79899 Other long term (current) drug therapy: Secondary | ICD-10-CM | POA: Insufficient documentation

## 2020-05-26 DIAGNOSIS — Z8546 Personal history of malignant neoplasm of prostate: Secondary | ICD-10-CM | POA: Insufficient documentation

## 2020-05-26 DIAGNOSIS — Z7982 Long term (current) use of aspirin: Secondary | ICD-10-CM | POA: Insufficient documentation

## 2020-05-26 DIAGNOSIS — I10 Essential (primary) hypertension: Secondary | ICD-10-CM | POA: Insufficient documentation

## 2020-05-26 DIAGNOSIS — I2510 Atherosclerotic heart disease of native coronary artery without angina pectoris: Secondary | ICD-10-CM | POA: Insufficient documentation

## 2020-05-26 DIAGNOSIS — M25562 Pain in left knee: Secondary | ICD-10-CM | POA: Diagnosis present

## 2020-05-26 HISTORY — PX: TOTAL KNEE ARTHROPLASTY: SHX125

## 2020-05-26 LAB — TYPE AND SCREEN
ABO/RH(D): O POS
Antibody Screen: NEGATIVE

## 2020-05-26 SURGERY — ARTHROPLASTY, KNEE, TOTAL
Anesthesia: General | Site: Knee | Laterality: Left

## 2020-05-26 MED ORDER — OXYCODONE HCL 5 MG PO TABS: 5.0000 mg | ORAL_TABLET | Freq: Once | ORAL | Status: DC | PRN

## 2020-05-26 MED ORDER — DEXAMETHASONE SODIUM PHOSPHATE 10 MG/ML IJ SOLN: 10.0000 mg | Freq: Once | INTRAMUSCULAR | Status: DC

## 2020-05-26 MED ORDER — ACETAMINOPHEN 160 MG/5ML PO SOLN
325.0000 mg | ORAL | Status: DC | PRN
Start: 2020-05-26 — End: 2020-05-26

## 2020-05-26 MED ORDER — ACETAMINOPHEN 325 MG PO TABS: 325.0000 mg | ORAL_TABLET | ORAL | Status: DC | PRN

## 2020-05-26 MED ORDER — BUPIVACAINE-EPINEPHRINE (PF) 0.25% -1:200000 IJ SOLN
INTRAMUSCULAR | Status: AC
  Filled 2020-05-26: qty 30

## 2020-05-26 MED ORDER — TRANEXAMIC ACID-NACL 1000-0.7 MG/100ML-% IV SOLN
1000.0000 mg | Freq: Once | INTRAVENOUS | Status: AC
  Administered 2020-05-26: 1000 mg via INTRAVENOUS
  Filled 2020-05-26: qty 100

## 2020-05-26 MED ORDER — MENTHOL 3 MG MT LOZG
1.0000 | LOZENGE | OROMUCOSAL | Status: DC | PRN
Start: 2020-05-26 — End: 2020-05-27

## 2020-05-26 MED ORDER — DIPHENHYDRAMINE HCL 12.5 MG/5ML PO ELIX: 12.5000 mg | ORAL_SOLUTION | ORAL | Status: DC | PRN

## 2020-05-26 MED ORDER — MEPIVACAINE HCL (PF) 2 % IJ SOLN
INTRAMUSCULAR | Status: AC
  Filled 2020-05-26: qty 20

## 2020-05-26 MED ORDER — VANCOMYCIN HCL 1000 MG IV SOLR
INTRAVENOUS | Status: AC
  Filled 2020-05-26: qty 1000

## 2020-05-26 MED ORDER — PHENYLEPHRINE 40 MCG/ML (10ML) SYRINGE FOR IV PUSH (FOR BLOOD PRESSURE SUPPORT)
PREFILLED_SYRINGE | INTRAVENOUS | Status: AC
  Filled 2020-05-26: qty 10

## 2020-05-26 MED ORDER — PHENOL 1.4 % MT LIQD: 1.0000 | OROMUCOSAL | Status: DC | PRN

## 2020-05-26 MED ORDER — CHLORHEXIDINE GLUCONATE 0.12 % MT SOLN
15.0000 mL | Freq: Once | OROMUCOSAL | Status: AC
  Administered 2020-05-26: 15 mL via OROMUCOSAL

## 2020-05-26 MED ORDER — LIDOCAINE 2% (20 MG/ML) 5 ML SYRINGE
INTRAMUSCULAR | Status: DC | PRN
  Administered 2020-05-26: 50 mg via INTRAVENOUS

## 2020-05-26 MED ORDER — FENTANYL CITRATE (PF) 100 MCG/2ML IJ SOLN
50.0000 ug | INTRAMUSCULAR | Status: DC
  Administered 2020-05-26: 100 ug via INTRAVENOUS
  Filled 2020-05-26: qty 2

## 2020-05-26 MED ORDER — KETOROLAC TROMETHAMINE 30 MG/ML IJ SOLN
INTRAMUSCULAR | Status: AC
  Filled 2020-05-26: qty 1

## 2020-05-26 MED ORDER — KETOROLAC TROMETHAMINE 30 MG/ML IJ SOLN
INTRAMUSCULAR | Status: DC | PRN
  Administered 2020-05-26: 30 mg

## 2020-05-26 MED ORDER — SODIUM CHLORIDE 0.9 % IV SOLN: INTRAVENOUS | Status: DC

## 2020-05-26 MED ORDER — POVIDONE-IODINE 10 % EX SWAB
2.0000 "application " | Freq: Once | CUTANEOUS | Status: AC
  Administered 2020-05-26: 2 via TOPICAL

## 2020-05-26 MED ORDER — ONDANSETRON HCL 4 MG/2ML IJ SOLN: 4.0000 mg | Freq: Once | INTRAMUSCULAR | Status: DC | PRN

## 2020-05-26 MED ORDER — DEXAMETHASONE SODIUM PHOSPHATE 10 MG/ML IJ SOLN
INTRAMUSCULAR | Status: DC | PRN
  Administered 2020-05-26: 10 mg

## 2020-05-26 MED ORDER — TRANEXAMIC ACID-NACL 1000-0.7 MG/100ML-% IV SOLN
1000.0000 mg | INTRAVENOUS | Status: AC
  Administered 2020-05-26: 1000 mg via INTRAVENOUS
  Filled 2020-05-26: qty 100

## 2020-05-26 MED ORDER — METOCLOPRAMIDE HCL 5 MG/ML IJ SOLN: 5.0000 mg | Freq: Three times a day (TID) | INTRAMUSCULAR | Status: DC | PRN

## 2020-05-26 MED ORDER — 0.9 % SODIUM CHLORIDE (POUR BTL) OPTIME
TOPICAL | Status: DC | PRN
  Administered 2020-05-26: 1000 mL

## 2020-05-26 MED ORDER — HYDROMORPHONE HCL 1 MG/ML IJ SOLN
0.5000 mg | INTRAMUSCULAR | Status: DC | PRN
Start: 2020-05-26 — End: 2020-05-27

## 2020-05-26 MED ORDER — MEPERIDINE HCL 50 MG/ML IJ SOLN: 6.2500 mg | INTRAMUSCULAR | Status: DC | PRN

## 2020-05-26 MED ORDER — ACETAMINOPHEN 325 MG PO TABS: 325.0000 mg | ORAL_TABLET | Freq: Four times a day (QID) | ORAL | Status: DC | PRN

## 2020-05-26 MED ORDER — PROPOFOL 500 MG/50ML IV EMUL
INTRAVENOUS | Status: DC | PRN
  Administered 2020-05-26: 65 ug/kg/min via INTRAVENOUS

## 2020-05-26 MED ORDER — SALINE SPRAY 0.65 % NA SOLN
1.0000 | NASAL | Status: DC | PRN
  Filled 2020-05-26: qty 44

## 2020-05-26 MED ORDER — ATORVASTATIN CALCIUM 40 MG PO TABS
80.0000 mg | ORAL_TABLET | Freq: Every day | ORAL | Status: DC
  Administered 2020-05-27: 80 mg via ORAL
  Filled 2020-05-26: qty 2

## 2020-05-26 MED ORDER — PROPOFOL 10 MG/ML IV BOLUS
INTRAVENOUS | Status: AC
  Filled 2020-05-26: qty 20

## 2020-05-26 MED ORDER — HYDROMORPHONE HCL 2 MG PO TABS
2.0000 mg | ORAL_TABLET | ORAL | Status: DC | PRN
  Administered 2020-05-26 – 2020-05-27 (×3): 2 mg via ORAL
  Administered 2020-05-27: 4 mg via ORAL
  Filled 2020-05-26 (×3): qty 1
  Filled 2020-05-26: qty 2

## 2020-05-26 MED ORDER — CEFAZOLIN SODIUM-DEXTROSE 2-4 GM/100ML-% IV SOLN
2.0000 g | INTRAVENOUS | Status: AC
  Administered 2020-05-26: 2 g via INTRAVENOUS
  Filled 2020-05-26: qty 100

## 2020-05-26 MED ORDER — ASPIRIN 81 MG PO CHEW
81.0000 mg | CHEWABLE_TABLET | Freq: Two times a day (BID) | ORAL | Status: DC
  Administered 2020-05-26 – 2020-05-27 (×2): 81 mg via ORAL
  Filled 2020-05-26 (×2): qty 1

## 2020-05-26 MED ORDER — SODIUM CHLORIDE 0.9 % IR SOLN
Status: DC | PRN
  Administered 2020-05-26: 1000 mL

## 2020-05-26 MED ORDER — STERILE WATER FOR IRRIGATION IR SOLN
Status: DC | PRN
  Administered 2020-05-26: 2000 mL

## 2020-05-26 MED ORDER — DEXAMETHASONE SODIUM PHOSPHATE 10 MG/ML IJ SOLN
10.0000 mg | Freq: Once | INTRAMUSCULAR | Status: AC
  Administered 2020-05-27: 10 mg via INTRAVENOUS
  Filled 2020-05-26: qty 1

## 2020-05-26 MED ORDER — DOCUSATE SODIUM 100 MG PO CAPS
100.0000 mg | ORAL_CAPSULE | Freq: Two times a day (BID) | ORAL | Status: DC
  Administered 2020-05-26 – 2020-05-27 (×2): 100 mg via ORAL
  Filled 2020-05-26 (×2): qty 1

## 2020-05-26 MED ORDER — OXYCODONE HCL 5 MG/5ML PO SOLN: 5.0000 mg | Freq: Once | ORAL | Status: DC | PRN

## 2020-05-26 MED ORDER — SODIUM CHLORIDE (PF) 0.9 % IJ SOLN
INTRAMUSCULAR | Status: AC
  Filled 2020-05-26: qty 30

## 2020-05-26 MED ORDER — PANTOPRAZOLE SODIUM 40 MG PO TBEC
40.0000 mg | DELAYED_RELEASE_TABLET | Freq: Two times a day (BID) | ORAL | Status: DC
  Administered 2020-05-27: 40 mg via ORAL
  Filled 2020-05-26: qty 1

## 2020-05-26 MED ORDER — PROPOFOL 10 MG/ML IV BOLUS
INTRAVENOUS | Status: DC | PRN
  Administered 2020-05-26 (×2): 10 mg via INTRAVENOUS
  Administered 2020-05-26: 20 mg via INTRAVENOUS

## 2020-05-26 MED ORDER — ONDANSETRON HCL 4 MG PO TABS: 4.0000 mg | ORAL_TABLET | Freq: Four times a day (QID) | ORAL | Status: DC | PRN

## 2020-05-26 MED ORDER — SODIUM CHLORIDE (PF) 0.9 % IJ SOLN
INTRAMUSCULAR | Status: DC | PRN
  Administered 2020-05-26: 30 mL

## 2020-05-26 MED ORDER — LORATADINE 10 MG PO TABS
10.0000 mg | ORAL_TABLET | Freq: Every day | ORAL | Status: DC
  Administered 2020-05-27: 10 mg via ORAL
  Filled 2020-05-26: qty 1

## 2020-05-26 MED ORDER — FERROUS SULFATE 325 (65 FE) MG PO TABS
325.0000 mg | ORAL_TABLET | Freq: Three times a day (TID) | ORAL | Status: DC
  Administered 2020-05-27: 325 mg via ORAL
  Filled 2020-05-26: qty 1

## 2020-05-26 MED ORDER — FENTANYL CITRATE (PF) 100 MCG/2ML IJ SOLN
25.0000 ug | INTRAMUSCULAR | Status: DC | PRN
Start: 2020-05-26 — End: 2020-05-26

## 2020-05-26 MED ORDER — IRBESARTAN 150 MG PO TABS
150.0000 mg | ORAL_TABLET | Freq: Every day | ORAL | Status: DC
  Administered 2020-05-27: 150 mg via ORAL
  Filled 2020-05-26: qty 1

## 2020-05-26 MED ORDER — FLUTICASONE PROPIONATE 50 MCG/ACT NA SUSP
2.0000 | Freq: Every day | NASAL | Status: DC | PRN
  Filled 2020-05-26: qty 16

## 2020-05-26 MED ORDER — DEXMEDETOMIDINE (PRECEDEX) IN NS 20 MCG/5ML (4 MCG/ML) IV SYRINGE
PREFILLED_SYRINGE | INTRAVENOUS | Status: AC
  Filled 2020-05-26: qty 5

## 2020-05-26 MED ORDER — DULOXETINE HCL 30 MG PO CPEP
30.0000 mg | ORAL_CAPSULE | Freq: Every day | ORAL | Status: DC
  Administered 2020-05-27: 30 mg via ORAL
  Filled 2020-05-26: qty 1

## 2020-05-26 MED ORDER — TOBRAMYCIN SULFATE 1.2 G IJ SOLR
INTRAMUSCULAR | Status: AC
  Filled 2020-05-26: qty 1.2

## 2020-05-26 MED ORDER — BISACODYL 10 MG RE SUPP: 10.0000 mg | Freq: Every day | RECTAL | Status: DC | PRN

## 2020-05-26 MED ORDER — ONDANSETRON HCL 4 MG/2ML IJ SOLN
INTRAMUSCULAR | Status: DC | PRN
  Administered 2020-05-26: 4 mg via INTRAVENOUS

## 2020-05-26 MED ORDER — LIDOCAINE 2% (20 MG/ML) 5 ML SYRINGE
INTRAMUSCULAR | Status: AC
  Filled 2020-05-26: qty 5

## 2020-05-26 MED ORDER — BUPIVACAINE-EPINEPHRINE (PF) 0.25% -1:200000 IJ SOLN
INTRAMUSCULAR | Status: DC | PRN
  Administered 2020-05-26: 30 mL

## 2020-05-26 MED ORDER — POLYETHYLENE GLYCOL 3350 17 G PO PACK: 17.0000 g | PACK | Freq: Every day | ORAL | Status: DC | PRN

## 2020-05-26 MED ORDER — METOCLOPRAMIDE HCL 5 MG PO TABS: 5.0000 mg | ORAL_TABLET | Freq: Three times a day (TID) | ORAL | Status: DC | PRN

## 2020-05-26 MED ORDER — BUPIVACAINE IN DEXTROSE 0.75-8.25 % IT SOLN
INTRATHECAL | Status: DC | PRN
  Administered 2020-05-26: 1.8 mL via INTRATHECAL

## 2020-05-26 MED ORDER — ONDANSETRON HCL 4 MG/2ML IJ SOLN: 4.0000 mg | Freq: Four times a day (QID) | INTRAMUSCULAR | Status: DC | PRN

## 2020-05-26 MED ORDER — ROPIVACAINE HCL 7.5 MG/ML IJ SOLN
INTRAMUSCULAR | Status: DC | PRN
  Administered 2020-05-26: 25 mL via PERINEURAL

## 2020-05-26 MED ORDER — ORAL CARE MOUTH RINSE: 15.0000 mL | Freq: Once | OROMUCOSAL | Status: AC

## 2020-05-26 MED ORDER — CELECOXIB 200 MG PO CAPS
200.0000 mg | ORAL_CAPSULE | Freq: Two times a day (BID) | ORAL | Status: DC
  Administered 2020-05-26 – 2020-05-27 (×2): 200 mg via ORAL
  Filled 2020-05-26 (×2): qty 1

## 2020-05-26 MED ORDER — HYDROCORTISONE-IODOQUINOL 1-1 % EX CREA: 1.0000 "application " | TOPICAL_CREAM | Freq: Every day | CUTANEOUS | Status: DC | PRN

## 2020-05-26 MED ORDER — DEXAMETHASONE SODIUM PHOSPHATE 10 MG/ML IJ SOLN
INTRAMUSCULAR | Status: DC | PRN
  Administered 2020-05-26: 4 mg via INTRAVENOUS

## 2020-05-26 MED ORDER — METHOCARBAMOL 500 MG PO TABS
500.0000 mg | ORAL_TABLET | Freq: Four times a day (QID) | ORAL | Status: DC | PRN
  Administered 2020-05-26 – 2020-05-27 (×2): 500 mg via ORAL
  Filled 2020-05-26 (×2): qty 1

## 2020-05-26 MED ORDER — CEFAZOLIN SODIUM-DEXTROSE 2-4 GM/100ML-% IV SOLN
2.0000 g | Freq: Four times a day (QID) | INTRAVENOUS | Status: AC
  Administered 2020-05-26 – 2020-05-27 (×2): 2 g via INTRAVENOUS
  Filled 2020-05-26 (×2): qty 100

## 2020-05-26 MED ORDER — METHOCARBAMOL 500 MG IVPB - SIMPLE MED
500.0000 mg | Freq: Four times a day (QID) | INTRAVENOUS | Status: DC | PRN
  Filled 2020-05-26: qty 50

## 2020-05-26 MED ORDER — PHENYLEPHRINE HCL-NACL 10-0.9 MG/250ML-% IV SOLN
INTRAVENOUS | Status: DC | PRN
  Administered 2020-05-26: 15 ug/min via INTRAVENOUS

## 2020-05-26 MED ORDER — DULOXETINE HCL 30 MG PO CPEP: 30.0000 mg | ORAL_CAPSULE | Freq: Every day | ORAL | 1 refills | Status: DC

## 2020-05-26 MED ORDER — LACTATED RINGERS IV SOLN: INTRAVENOUS | Status: DC

## 2020-05-26 SURGICAL SUPPLY — 57 items
ADH SKN CLS APL DERMABOND .7 (GAUZE/BANDAGES/DRESSINGS) ×1
ATTUNE MED ANAT PAT 38 KNEE (Knees) ×1 IMPLANT
ATTUNE PS FEM LT SZ 6 CEM KNEE (Femur) ×1 IMPLANT
ATTUNE PSRP INSR SZ6 6 KNEE (Insert) ×1 IMPLANT
BAG SPEC THK2 15X12 ZIP CLS (MISCELLANEOUS)
BAG ZIPLOCK 12X15 (MISCELLANEOUS) IMPLANT
BASE TIBIAL ROT PLAT SZ 7 KNEE (Knees) IMPLANT
BLADE SAW SGTL 11.0X1.19X90.0M (BLADE) IMPLANT
BLADE SAW SGTL 13.0X1.19X90.0M (BLADE) ×2 IMPLANT
BLADE SURG SZ10 CARB STEEL (BLADE) ×4 IMPLANT
BNDG ELASTIC 6X5.8 VLCR STR LF (GAUZE/BANDAGES/DRESSINGS) ×2 IMPLANT
BOWL SMART MIX CTS (DISPOSABLE) ×2 IMPLANT
BSPLAT TIB 7 CMNT ROT PLAT STR (Knees) ×1 IMPLANT
CEMENT HV SMART SET (Cement) ×2 IMPLANT
COVER WAND RF STERILE (DRAPES) IMPLANT
CUFF TOURN SGL QUICK 34 (TOURNIQUET CUFF) ×2
CUFF TRNQT CYL 34X4.125X (TOURNIQUET CUFF) ×1 IMPLANT
DECANTER SPIKE VIAL GLASS SM (MISCELLANEOUS) ×4 IMPLANT
DERMABOND ADVANCED (GAUZE/BANDAGES/DRESSINGS) ×1
DERMABOND ADVANCED .7 DNX12 (GAUZE/BANDAGES/DRESSINGS) ×1 IMPLANT
DRAPE U-SHAPE 47X51 STRL (DRAPES) ×2 IMPLANT
DRESSING AQUACEL AG SP 3.5X10 (GAUZE/BANDAGES/DRESSINGS) ×1 IMPLANT
DRSG AQUACEL AG ADV 3.5X10 (GAUZE/BANDAGES/DRESSINGS) ×1 IMPLANT
DRSG AQUACEL AG SP 3.5X10 (GAUZE/BANDAGES/DRESSINGS) ×2
DURAPREP 26ML APPLICATOR (WOUND CARE) ×4 IMPLANT
ELECT REM PT RETURN 15FT ADLT (MISCELLANEOUS) ×2 IMPLANT
GLOVE ORTHO TXT STRL SZ7.5 (GLOVE) ×2 IMPLANT
GLOVE SURG ENC MOIS LTX SZ6 (GLOVE) IMPLANT
GLOVE SURG LTX SZ8 (GLOVE) ×2 IMPLANT
GLOVE SURG UNDER POLY LF SZ6.5 (GLOVE) IMPLANT
GLOVE SURG UNDER POLY LF SZ7.5 (GLOVE) ×2 IMPLANT
GOWN STRL REUS W/TWL LRG LVL3 (GOWN DISPOSABLE) ×2 IMPLANT
HANDPIECE INTERPULSE COAX TIP (DISPOSABLE) ×2
HOLDER FOLEY CATH W/STRAP (MISCELLANEOUS) ×1 IMPLANT
KIT TURNOVER KIT A (KITS) ×2 IMPLANT
MANIFOLD NEPTUNE II (INSTRUMENTS) ×2 IMPLANT
NDL SAFETY ECLIPSE 18X1.5 (NEEDLE) IMPLANT
NEEDLE HYPO 18GX1.5 SHARP (NEEDLE)
NS IRRIG 1000ML POUR BTL (IV SOLUTION) ×2 IMPLANT
PACK TOTAL KNEE CUSTOM (KITS) ×2 IMPLANT
PENCIL SMOKE EVACUATOR (MISCELLANEOUS) IMPLANT
PIN DRILL FIX HALF THREAD (BIT) ×1 IMPLANT
PIN FIX SIGMA LCS THRD HI (PIN) ×1 IMPLANT
PROTECTOR NERVE ULNAR (MISCELLANEOUS) ×2 IMPLANT
SET HNDPC FAN SPRY TIP SCT (DISPOSABLE) ×1 IMPLANT
SET PAD KNEE POSITIONER (MISCELLANEOUS) ×2 IMPLANT
SUT MNCRL AB 4-0 PS2 18 (SUTURE) ×2 IMPLANT
SUT STRATAFIX PDS+ 0 24IN (SUTURE) ×2 IMPLANT
SUT VIC AB 1 CT1 36 (SUTURE) ×2 IMPLANT
SUT VIC AB 2-0 CT1 27 (SUTURE) ×6
SUT VIC AB 2-0 CT1 TAPERPNT 27 (SUTURE) ×3 IMPLANT
SYR 3ML LL SCALE MARK (SYRINGE) ×2 IMPLANT
TIBIAL BASE ROT PLAT SZ 7 KNEE (Knees) ×2 IMPLANT
TRAY FOLEY MTR SLVR 16FR STAT (SET/KITS/TRAYS/PACK) ×2 IMPLANT
TUBE SUCTION HIGH CAP CLEAR NV (SUCTIONS) ×2 IMPLANT
WATER STERILE IRR 1000ML POUR (IV SOLUTION) ×4 IMPLANT
WRAP KNEE MAXI GEL POST OP (GAUZE/BANDAGES/DRESSINGS) ×2 IMPLANT

## 2020-05-26 NOTE — Transfer of Care (Signed)
Immediate Anesthesia Transfer of Care Note  Patient: David [name]  Procedure(s) Performed: TOTAL KNEE ARTHROPLASTY (Left Knee)  Patient Location: PACU  Anesthesia Type:Spinal  Level of Consciousness: awake, alert  and oriented  Airway & Oxygen Therapy: Patient Spontanous Breathing  Post-op Assessment: Report given to RN and Post -op Vital signs reviewed and stable  Post vital signs: Reviewed and stable  Last Vitals:  Vitals Value Taken Time  BP 105/58 05/26/20 1605  Temp    Pulse 67 05/26/20 1607  Resp 17 05/26/20 1607  SpO2 98 % 05/26/20 1607  Vitals shown include unvalidated device data.  Last Pain:  Vitals:   05/26/20 1241  TempSrc:   PainSc: 3       Patients Stated Pain Goal: 2 (85/92/92 4462)  Complications: No complications documented.

## 2020-05-26 NOTE — Interval H&P Note (Signed)
History and Physical Interval Note:  05/26/2020 12:47 PM  David [name]  has presented today for surgery, with the diagnosis of Left knee osteoarthritis.  The various methods of treatment have been discussed with the patient and family. After consideration of risks, benefits and other options for treatment, the patient has consented to  Procedure(s) with comments: TOTAL KNEE ARTHROPLASTY (Left) - 70 mins as a surgical intervention.  The patient's history has been reviewed, patient examined, no change in status, stable for surgery.  I have reviewed the patient's chart and labs.  Questions were answered to the patient's satisfaction.     Mauri Pole

## 2020-05-26 NOTE — Progress Notes (Signed)
Assisted Dr. Oddono with left, ultrasound guided, adductor canal block. Side rails up, monitors on throughout procedure. See vital signs in flow sheet. Tolerated Procedure well.  

## 2020-05-26 NOTE — Anesthesia Procedure Notes (Signed)
Anesthesia Regional Block: Adductor canal block   Pre-Anesthetic Checklist: ,, timeout performed, Correct Patient, Correct Site, Correct Laterality, Correct Procedure, Correct Position, site marked, Risks and benefits discussed,  Surgical consent,  Pre-op evaluation,  At surgeon's request and post-op pain management  Laterality: Left  Prep: chloraprep       Needles:  Injection technique: Single-shot  Needle Type: Echogenic Stimulator Needle     Needle Length: 5cm  Needle Gauge: 22     Additional Needles:   Procedures:, nerve stimulator,,, ultrasound used (permanent image in chart),,,,  Narrative:  Start time: 05/26/2020 1:55 PM End time: 05/26/2020 2:00 PM Injection made incrementally with aspirations every 5 mL.  Performed by: Personally  Anesthesiologist: Janeece Riggers, MD  Additional Notes: Functioning IV was confirmed and monitors were applied.  A 78mm 22ga Arrow echogenic stimulator needle was used. Sterile prep and drape,hand hygiene and sterile gloves were used. Ultrasound guidance: relevant anatomy identified, needle position confirmed, local anesthetic spread visualized around nerve(s)., vascular puncture avoided.  Image printed for medical record. Negative aspiration and negative test dose prior to incremental administration of local anesthetic. The patient tolerated the procedure well.

## 2020-05-26 NOTE — Anesthesia Procedure Notes (Signed)
Spinal  Patient location during procedure: OR Start time: 05/26/2020 1:55 PM End time: 05/26/2020 2:22 PM Reason for block: surgical anesthesia Staffing Anesthesiologist: Janeece Riggers, MD Preanesthetic Checklist Completed: patient identified, IV checked, site marked, risks and benefits discussed, surgical consent, monitors and equipment checked, pre-op evaluation and timeout performed Spinal Block Patient position: sitting Prep: DuraPrep Patient monitoring: heart rate, cardiac monitor, continuous pulse ox and blood pressure Approach: midline Location: L3-4 Injection technique: single-shot Needle Needle type: Sprotte  Needle gauge: 24 G Needle length: 9 cm Assessment Sensory level: T4 Events: CSF return

## 2020-05-26 NOTE — Discharge Instructions (Signed)

## 2020-05-26 NOTE — Op Note (Addendum)
NAME:  David [name]                      MEDICAL RECORD NO.:  [id number]                             FACILITY:  [facility]      PHYSICIAN:  Pietro Cassis. [name], M.D.  DATE OF BIRTH:  1941/02/25      DATE OF PROCEDURE:  05/26/2020                                     OPERATIVE REPORT         PREOPERATIVE DIAGNOSIS:  Left knee osteoarthritis.      POSTOPERATIVE DIAGNOSIS:  Left knee osteoarthritis.      FINDINGS:  The patient was noted to have complete loss of cartilage and   bone-on-bone arthritis with associated osteophytes in the medial and patellofemoral compartments of   the knee with a significant synovitis and associated effusion.  The patient had failed months of conservative treatment including medications, injection therapy, activity modification.     PROCEDURE:  Left total knee replacement.      COMPONENTS USED:  DePuy Attune rotating platform posterior stabilized knee   system, a size 6 femur, 7 tibia, size 6 mm PS AOX insert, and 38 anatomic patellar   button.      SURGEON:  Pietro Cassis. [name], M.D.      ASSISTANT:  Griffith Citron, PA-C.      ANESTHESIA:  Regional and Spinal.      SPECIMENS:  None.      COMPLICATION:  None.      DRAINS:  None.  EBL: <100 cc      TOURNIQUET TIME:   Total Tourniquet Time Documented: Thigh (Left) - 32 minutes Total: Thigh (Left) - 32 minutes  .      The patient was stable to the recovery room.      INDICATION FOR PROCEDURE:  David [name] is a 80 y.o. male patient of   mine.  The patient had been seen, evaluated, and treated for months conservatively in the   office with medication, activity modification, and injections.  The patient had   radiographic changes of bone-on-bone arthritis with endplate sclerosis and osteophytes noted.  Based on the radiographic changes and failed conservative measures, the patient   decided to proceed with definitive treatment, total knee replacement.  Risks of infection, DVT, component failure,  need for revision surgery, neurovascular injury were reviewed in the office setting.  The postop course was reviewed stressing the efforts to maximize post-operative satisfaction and function.  Consent was obtained for benefit of pain   relief.      PROCEDURE IN DETAIL:  The patient was brought to the operative theater.   Once adequate anesthesia, preoperative antibiotics, 2 gm of Ancef,1 gm of Tranexamic Acid, and 10 mg of Decadron administered, the patient was positioned supine with a left thigh tourniquet placed.  The  left lower extremity was prepped and draped in sterile fashion.  A time-   out was performed identifying the patient, planned procedure, and the appropriate extremity.      The left lower extremity was placed in the [facility] leg holder.  The leg was   exsanguinated, tourniquet elevated to 250 mmHg.  A midline incision was  made followed by median parapatellar arthrotomy.  Following initial   exposure, attention was first directed to the patella.  Precut   measurement was noted to be 24 mm.  I resected down to 14 mm and used a   38 anatomic patellar button to restore patellar height as well as cover the cut surface.      The lug holes were drilled and a metal shim was placed to protect the   patella from retractors and saw blade during the procedure.      At this point, attention was now directed to the femur.  The femoral   canal was opened with a drill, irrigated to try to prevent fat emboli.  An   intramedullary rod was passed at 5 degrees valgus, 9 mm of bone was   resected off the distal femur.  Following this resection, the tibia was   subluxated anteriorly.  Using the extramedullary guide, 2 mm of bone was resected off   the proximal medial tibia.  We confirmed the gap would be   stable medially and laterally with a size 5 spacer block as well as confirmed that the tibial cut was perpendicular in the coronal plane, checking with an alignment rod.      Once this was  done, I sized the femur to be a size 6 in the anterior-   posterior dimension, chose a standard component based on medial and   lateral dimension.  The size 6 rotation block was then pinned in   position anterior referenced using the C-clamp to set rotation.  The   anterior, posterior, and  chamfer cuts were made without difficulty nor   notching making certain that I was along the anterior cortex to help   with flexion gap stability.      The final box cut was made off the lateral aspect of distal femur.      At this point, the tibia was sized to be a size 7.  The size 7 tray was   then pinned in position through the medial third of the tubercle,   drilled, and keel punched.  Trial reduction was now carried with a 6 femur,  7 tibia, a size 6 mm PS insert, and the 38 anatomic patella botton.  The knee was brought to full extension with good flexion stability with the patella   tracking through the trochlea without application of pressure.  Given   all these findings the trial components removed.  Final components were   opened and cement was mixed.  The knee was irrigated with normal saline solution and pulse lavage.  The synovial lining was   then injected with 30 cc of 0.25% Marcaine with epinephrine, 1 cc of Toradol and 30 cc of NS for a total of 61 cc.     Final implants were then cemented onto cleaned and dried cut surfaces of bone with the knee brought to extension with a size 6 mm PS trial insert.      Once the cement had fully cured, excess cement was removed   throughout the knee.  I confirmed that I was satisfied with the range of   motion and stability, and the final size 6 mm PS AOX insert was chosen.  It was   placed into the knee.      The tourniquet had been let down at 32 minutes.  No significant   hemostasis was required.  The extensor mechanism was then reapproximated using #1 Vicryl  and #1 Stratafix sutures with the knee   in flexion.  The   remaining wound was closed  with 2-0 Vicryl and running 4-0 Monocryl.   The knee was cleaned, dried, dressed sterilely using Dermabond and   Aquacel dressing.  The patient was then   brought to recovery room in stable condition, tolerating the procedure   well.   Please note that Physician Assistant, Griffith Citron, PA-C was present for the entirety of the case, and was utilized for pre-operative positioning, peri-operative retractor management, general facilitation of the procedure and for primary wound closure at the end of the case.              Pietro Cassis [name], M.D.    05/26/2020 3:43 PM

## 2020-05-27 ENCOUNTER — Encounter (HOSPITAL_COMMUNITY): Payer: Self-pay | Admitting: Orthopedic Surgery

## 2020-05-27 DIAGNOSIS — M1712 Unilateral primary osteoarthritis, left knee: Secondary | ICD-10-CM | POA: Diagnosis not present

## 2020-05-27 LAB — CBC
HCT: 37 % — ABNORMAL LOW (ref 39.0–52.0)
Hemoglobin: 12.1 g/dL — ABNORMAL LOW (ref 13.0–17.0)
MCH: 30.8 pg (ref 26.0–34.0)
MCHC: 32.7 g/dL (ref 30.0–36.0)
MCV: 94.1 fL (ref 80.0–100.0)
Platelets: 184 10*3/uL (ref 150–400)
RBC: 3.93 MIL/uL — ABNORMAL LOW (ref 4.22–5.81)
RDW: 13.8 % (ref 11.5–15.5)
WBC: 12.5 10*3/uL — ABNORMAL HIGH (ref 4.0–10.5)
nRBC: 0 % (ref 0.0–0.2)

## 2020-05-27 LAB — BASIC METABOLIC PANEL
Anion gap: 5 (ref 5–15)
BUN: 25 mg/dL — ABNORMAL HIGH (ref 8–23)
CO2: 22 mmol/L (ref 22–32)
Calcium: 9.8 mg/dL (ref 8.9–10.3)
Chloride: 109 mmol/L (ref 98–111)
Creatinine, Ser: 1.07 mg/dL (ref 0.61–1.24)
GFR, Estimated: >60 mL/min (ref >60)
Glucose, Bld: 219 mg/dL — ABNORMAL HIGH (ref 70–99)
Potassium: 4.4 mmol/L (ref 3.5–5.1)
Sodium: 136 mmol/L (ref 135–145)

## 2020-05-27 MED ORDER — POLYETHYLENE GLYCOL 3350 17 G PO PACK: 17.0000 g | PACK | Freq: Every day | ORAL | 0 refills | Status: DC | PRN

## 2020-05-27 MED ORDER — DOCUSATE SODIUM 100 MG PO CAPS: 100.0000 mg | ORAL_CAPSULE | Freq: Two times a day (BID) | ORAL | 0 refills | Status: DC

## 2020-05-27 MED ORDER — HYDROMORPHONE HCL 2 MG PO TABS: 2.0000 mg | ORAL_TABLET | Freq: Four times a day (QID) | ORAL | 0 refills | Status: DC | PRN

## 2020-05-27 MED ORDER — TIZANIDINE HCL 4 MG PO TABS: 4.0000 mg | ORAL_TABLET | Freq: Three times a day (TID) | ORAL | 0 refills | Status: AC

## 2020-05-27 MED ORDER — CELECOXIB 200 MG PO CAPS: 200.0000 mg | ORAL_CAPSULE | Freq: Two times a day (BID) | ORAL | 0 refills | Status: DC

## 2020-05-27 MED ORDER — ASPIRIN 81 MG PO CHEW: 81.0000 mg | CHEWABLE_TABLET | Freq: Two times a day (BID) | ORAL | 0 refills | Status: AC

## 2020-05-27 NOTE — Progress Notes (Signed)
Subjective: 1 Day Post-Op Procedure(s) (LRB): TOTAL KNEE ARTHROPLASTY (Left) Patient reports pain as mild.   Patient seen in rounds with Dr. [name]. Patient is well, and has had no acute complaints or problems. No acute events overnight. Foley catheter removed. Patient states he did not sleep well, but is having minimal pain. We will start therapy today.   Objective: Vital signs in last 24 hours: Temp:  [97.5 F (36.4 C)-98.8 F (37.1 C)] 98.7 F (37.1 C) (03/30 0557) Pulse Rate:  [60-83] 68 (03/30 0557) Resp:  [14-19] 16 (03/30 0557) BP: (105-152)/(58-82) 128/81 (03/30 0557) SpO2:  [95 %-100 %] 96 % (03/30 0557) Weight:  [88.9 kg] 88.9 kg (03/29 1241)  Intake/Output from previous day:  Intake/Output Summary (Last 24 hours) at 05/27/2020 0806 Last data filed at 05/27/2020 0700 Gross per 24 hour  Intake 1971.25 ml  Output 1040 ml  Net 931.25 ml     Intake/Output this shift: No intake/output data recorded.  Labs: Recent Labs    05/27/20 0332  HGB 12.1*   Recent Labs    05/27/20 0332  WBC 12.5*  RBC 3.93*  HCT 37.0*  PLT 184   Recent Labs    05/27/20 0332  NA 136  K 4.4  CL 109  CO2 22  BUN 25*  CREATININE 1.07  GLUCOSE 219*  CALCIUM 9.8   No results for input(s): LABPT, INR in the last 72 hours.  Exam: General - Patient is Alert and Oriented Extremity - Neurologically intact Sensation intact distally Intact pulses distally Dorsiflexion/Plantar flexion intact Dressing - dressing C/D/I Motor Function - intact, moving foot and toes well on exam.   Past Medical History:  Diagnosis Date  . Allergy   . Arthritis    gen.  and left hip  . BPH (benign prostatic hypertrophy)   . Coronary artery disease   . GERD (gastroesophageal reflux disease)    occasional  . History of kidney stones    2007  . Hyperlipidemia   . Hypertension   . Kidney tumor   . LBP (low back pain) 06/2005   L5 radicular symptoms; MRI of LS spine severe spondylosis at L4-L5  with central cancal stenosis   . Nocturia   . Organic impotence   . Prostate cancer ([place]) 10/2011   (Low grade) [facility] - Dr. Junious Silk  . Pyelonephritis 03/05/2013  . Renal oncocytoma of right kidney 01/23/2013   As 3.2 x 3.5 cm enhancing exophytic mass projecting off the lower pole. This is consistent with a renal cell carcinoma. No retroperitoneal lymphadenopathy.   [name] Spermatocele   . 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 Wears glasses     Assessment/Plan: 1 Day Post-Op Procedure(s) (LRB): TOTAL KNEE ARTHROPLASTY (Left) Active Problems:   S/P total knee arthroplasty, left  Estimated body mass index is 26.58 kg/m as calculated from the following:   Height as of this encounter: 6' (1.829 m).   Weight as of this encounter: 88.9 kg. Advance diet Up with therapy D/C IV fluids   Patient's anticipated LOS is less than 2 midnights, meeting these requirements: - Younger than 30 - Lives within 1 hour of care - Has a competent adult at home to recover with post-op recover - NO history of  - Chronic pain requiring opiods  - Diabetes  - Coronary Artery Disease  - Heart failure  - Heart attack  - Stroke  - DVT/VTE  - Cardiac arrhythmia  - Respiratory Failure/COPD  - Renal failure  - Anemia  -  Advanced Liver disease  DVT Prophylaxis - Aspirin Weight bearing as tolerated.  Plan is to go Home after hospital stay. Plan for discharge today following 1-2 sessions of PT as long as they are meeting their goals. Patient is scheduled for OPPT. Follow up in the office in 2 weeks.   Griffith Citron, PA-C Orthopedic Surgery [phone] 05/27/2020, 8:06 AM

## 2020-05-27 NOTE — Evaluation (Signed)
Physical Therapy Evaluation Patient Details Name: David [name] MRN: [id number] DOB: [birth year] Today's Date: 05/27/2020   History of Present Illness  Pt is a 80 year old male s/p left TKA  Clinical Impression  Pt is s/p TKA resulting in the deficits listed below (see PT Problem List).  Pt will benefit from skilled PT to increase their independence and safety with mobility to allow discharge to the venue listed below.  Pt eager to mobilize and assisted with ambulating in hallway.  Pt also performed exercises.  Pt to d/c home later today after stair training next session.      Follow Up Recommendations Follow surgeon's recommendation for DC plan and follow-up therapies;Outpatient PT    Equipment Recommendations  Rolling walker with 5" wheels;3in1 (PT)    Recommendations for Other Services       Precautions / Restrictions Precautions Precautions: Fall;Knee Restrictions LLE Weight Bearing: Weight bearing as tolerated      Mobility  Bed Mobility Overal bed mobility: Needs Assistance Bed Mobility: Supine to Sit     Supine to sit: Supervision;HOB elevated          Transfers Overall transfer level: Needs assistance Equipment used: Rolling walker (2 wheeled) Transfers: Sit to/from Stand Sit to Stand: Min guard         General transfer comment: verbal cues for UE and LE positioning  Ambulation/Gait Ambulation/Gait assistance: Min guard Gait Distance (Feet): 120 Feet Assistive device: Rolling walker (2 wheeled) Gait Pattern/deviations: Step-to pattern;Decreased stance time - left;Antalgic Gait velocity: decr   General Gait Details: verbal cues for sequence, RW positioning, step length  Stairs            Wheelchair Mobility    Modified Rankin (Stroke Patients Only)       Balance                                             Pertinent Vitals/Pain Pain Assessment: Faces Faces Pain Scale: Hurts a little bit Pain Location: left  knee Pain Descriptors / Indicators: Sore Pain Intervention(s): Repositioned;Monitored during session;Ice applied    Home Living Family/patient expects to be discharged to:: Private residence Living Arrangements: Spouse/significant other   Type of Home: House Home Access: Stairs to enter Entrance Stairs-Rails: Can reach Chartered loss adjuster of Steps: 2 Home Layout: One level Home Equipment: None      Prior Function Level of Independence: Independent               Hand Dominance        Extremity/Trunk Assessment        Lower Extremity Assessment Lower Extremity Assessment: LLE deficits/detail LLE Deficits / Details: left knee AAROM approx -5-95* sitting, able to perform SLR and ankle pumps       Communication   Communication: No difficulties  Cognition Arousal/Alertness: Awake/alert Behavior During Therapy: WFL for tasks assessed/performed Overall Cognitive Status: Within Functional Limits for tasks assessed                                        General Comments      Exercises Total Joint Exercises Ankle Circles/Pumps: AROM;10 reps;Both Quad Sets: AROM;Both;10 reps Short Arc Quad: AROM;Seated;Left;10 reps Heel Slides: AROM;Left;10 reps;Seated Hip ABduction/ADduction: AROM;Left;10 reps Straight Leg Raises: AROM;Left;10 reps  Assessment/Plan    PT Assessment Patient needs continued PT services  PT Problem List Decreased strength;Decreased mobility;Decreased activity tolerance;Decreased balance;Decreased knowledge of use of DME;Decreased knowledge of precautions;Pain;Decreased range of motion       PT Treatment Interventions Stair training;Gait training;DME instruction;Therapeutic exercise;Balance training;Functional mobility training;Therapeutic activities;Patient/family education    PT Goals (Current goals can be found in the Care Plan section)  Acute Rehab PT Goals PT Goal Formulation: With patient Time For Goal  Achievement: 06/03/20 Potential to Achieve Goals: Good    Frequency 7X/week   Barriers to discharge        Co-evaluation               AM-PAC PT "6 Clicks" Mobility  Outcome Measure Help needed turning from your back to your side while in a flat bed without using bedrails?: A Little Help needed moving from lying on your back to sitting on the side of a flat bed without using bedrails?: A Little Help needed moving to and from a bed to a chair (including a wheelchair)?: A Little Help needed standing up from a chair using your arms (e.g., wheelchair or bedside chair)?: A Little Help needed to walk in hospital room?: A Little Help needed climbing 3-5 steps with a railing? : A Little 6 Click Score: 18    End of Session Equipment Utilized During Treatment: Gait belt Activity Tolerance: Patient tolerated treatment well Patient left: with call bell/phone within reach;in chair;with chair alarm set Nurse Communication: Mobility status PT Visit Diagnosis: Other abnormalities of gait and mobility (R26.89)    Time: 6720-9470 PT Time Calculation (min) (ACUTE ONLY): 20 min   Charges:   PT Evaluation $PT Eval Low Complexity: 1 Low         Kati PT, DPT Acute Rehabilitation Services Pager: [phone] Office: [phone]  York Ram E 05/27/2020, 11:49 AM

## 2020-05-27 NOTE — Progress Notes (Signed)
Physical Therapy Treatment Patient Details Name: David [name] MRN: [id number] DOB: [birth year] Today's Date: 05/27/2020    History of Present Illness Pt is a 80 year old male s/p left TKA    PT Comments    Pt ambulated in hallway and practiced safe stair technique with spouse observing.  Pt provided with HEP handout and had no further questions.  Pt feels ready for d/c home today.   Follow Up Recommendations  Follow surgeon's recommendation for DC plan and follow-up therapies;Outpatient PT     Equipment Recommendations  Rolling walker with 5" wheels;3in1 (PT)    Recommendations for Other Services       Precautions / Restrictions Precautions Precautions: Fall;Knee Restrictions LLE Weight Bearing: Weight bearing as tolerated    Mobility  Bed Mobility Overal bed mobility: Modified Independent Bed Mobility: Supine to Sit     Supine to sit: Supervision;HOB elevated          Transfers Overall transfer level: Needs assistance Equipment used: Rolling walker (2 wheeled) Transfers: Sit to/from Stand Sit to Stand: Supervision         General transfer comment: verbal cues for UE and LE positioning  Ambulation/Gait Ambulation/Gait assistance: Min guard;Supervision Gait Distance (Feet): 120 Feet Assistive device: Rolling walker (2 wheeled) Gait Pattern/deviations: Decreased stance time - left;Antalgic;Step-through pattern Gait velocity: decr   General Gait Details: verbal cues for sequence, RW positioning, step length   Stairs Stairs: Yes Stairs assistance: Min guard Stair Management: Step to pattern;Forwards;Two rails Number of Stairs: 3 General stair comments: verbal cues for safety and technique; spouse present and observed; pt performed twice   Wheelchair Mobility    Modified Rankin (Stroke Patients Only)       Balance                                            Cognition Arousal/Alertness: Awake/alert Behavior During  Therapy: WFL for tasks assessed/performed Overall Cognitive Status: Within Functional Limits for tasks assessed                                        Exercises     General Comments        Pertinent Vitals/Pain Pain Assessment: Faces Pain Score: 3  Faces Pain Scale: Hurts a little bit Pain Location: left knee Pain Descriptors / Indicators: Sore;Aching Pain Intervention(s): Repositioned;Monitored during session    Home Living Family/patient expects to be discharged to:: Private residence Living Arrangements: Spouse/significant other   Type of Home: House Home Access: Stairs to enter Entrance Stairs-Rails: Can reach both;Left;Right Home Layout: One level Home Equipment: None      Prior Function Level of Independence: Independent          PT Goals (current goals can now be found in the care plan section) Acute Rehab PT Goals PT Goal Formulation: With patient Time For Goal Achievement: 06/03/20 Potential to Achieve Goals: Good Progress towards PT goals: Progressing toward goals    Frequency    7X/week      PT Plan Current plan remains appropriate    Co-evaluation              AM-PAC PT "6 Clicks" Mobility   Outcome Measure  Help needed turning from your back to your side while in a flat  bed without using bedrails?: None Help needed moving from lying on your back to sitting on the side of a flat bed without using bedrails?: None Help needed moving to and from a bed to a chair (including a wheelchair)?: A Little Help needed standing up from a chair using your arms (e.g., wheelchair or bedside chair)?: A Little Help needed to walk in hospital room?: A Little Help needed climbing 3-5 steps with a railing? : A Little 6 Click Score: 20    End of Session Equipment Utilized During Treatment: Gait belt Activity Tolerance: Patient tolerated treatment well Patient left: with call bell/phone within reach;in chair;with chair alarm set;with  family/visitor present Nurse Communication: Mobility status PT Visit Diagnosis: Other abnormalities of gait and mobility (R26.89)     Time: 4255-2589 PT Time Calculation (min) (ACUTE ONLY): 22 min  Charges:  $Gait Training: 8-22 mins                    Arlyce Dice, DPT Acute Rehabilitation Services Pager: [phone] Office: [phone]  Kallum Jorgensen,KATHrine E 05/27/2020, 3:00 PM

## 2020-05-27 NOTE — TOC Transition Note (Signed)
Transition of Care [facility]) - CM/SW Discharge Note  Patient Details  Name: David [name] MRN: [id number] Date of Birth: 1940-11-14  [facility] CM/SW Contact:  Sherie Don, LCSW Phone Number: 05/27/2020, 10:09 AM  Clinical Narrative: Patient expected to discharge today. CSW met with patient to review discharge plan. Patient to discharge home with OPPT through [facility] and will need a 3N1 and RW. [employer] has DME orders and will deliver equipment to patient's room. No additional needs at this time. TOC signing off.  Final next level of care: OP Rehab Barriers to Discharge: No Barriers Identified  Patient Goals and CMS Choice Patient states their goals for this hospitalization and ongoing recovery are:: Discharge home with OPPT through [facility] Medicare.gov Compare Post Acute Care list provided to:: Patient Choice offered to / list presented to : Patient  Discharge Plan and Services        DME Arranged: 3-N-1,Walker rolling DME Agency: [employer] Representative spoke with at DME Agency: Pre-arranged before surgery  Readmission Risk Interventions No flowsheet data found.

## 2020-05-27 NOTE — Anesthesia Postprocedure Evaluation (Signed)
Anesthesia Post Note  Patient: David [name]  Procedure(s) Performed: TOTAL KNEE ARTHROPLASTY (Left Knee)     Patient location during evaluation: PACU Anesthesia Type: General Level of consciousness: awake and alert Pain management: pain level controlled Vital Signs Assessment: post-procedure vital signs reviewed and stable Respiratory status: spontaneous breathing, nonlabored ventilation, respiratory function stable and patient connected to nasal cannula oxygen Cardiovascular status: blood pressure returned to baseline and stable Postop Assessment: no apparent nausea or vomiting Anesthetic complications: no   No complications documented.  Last Vitals:  Vitals:   05/27/20 0557 05/27/20 1014  BP: 128/81 (!) 151/86  Pulse: 68 75  Resp: 16 16  Temp: 37.1 C 36.8 C  SpO2: 96% 94%    Last Pain:  Vitals:   05/27/20 1105  TempSrc:   PainSc: (P) 2                  Aimar Borghi

## 2020-05-31 NOTE — Discharge Summary (Signed)
Physician Discharge Summary   Patient ID: LEVII HAIRFIELD MRN: [id number] DOB/AGE: 06/18/1940 80 y.o.  Admit date: 05/26/2020 Discharge date: 05/27/2020  Primary Diagnosis: Left knee osteoarthritis   Admission Diagnoses:  Past Medical History:  Diagnosis Date  . Allergy   . Arthritis    gen.  and left hip  . BPH (benign prostatic hypertrophy)   . Coronary artery disease   . GERD (gastroesophageal reflux disease)    occasional  . History of kidney stones    2007  . Hyperlipidemia   . Hypertension   . Kidney tumor   . LBP (low back pain) 06/2005   L5 radicular symptoms; MRI of LS spine severe spondylosis at L4-L5 with central cancal stenosis   . Nocturia   . Organic impotence   . Prostate cancer ([place]) 10/2011   (Low grade) [facility] - Dr. Junious Silk  . Pyelonephritis 03/05/2013  . Renal oncocytoma of right kidney 01/23/2013   As 3.2 x 3.5 cm enhancing exophytic mass projecting off the lower pole. This is consistent with a renal cell carcinoma. No retroperitoneal lymphadenopathy.   [name] Spermatocele   . 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 Wears glasses    Discharge Diagnoses:   Active Problems:   S/P total knee arthroplasty, left  Estimated body mass index is 26.58 kg/m as calculated from the following:   Height as of this encounter: 6' (1.829 m).   Weight as of this encounter: 88.9 kg.  Procedure:  Procedure(s) (LRB): TOTAL KNEE ARTHROPLASTY (Left)   Consults: None  HPI: David [name] is a 80 y.o. male patient of   mine.  The patient had been seen, evaluated, and treated for months conservatively in the   office with medication, activity modification, and injections.  The patient had   radiographic changes of bone-on-bone arthritis with endplate sclerosis and osteophytes noted.  Based on the radiographic changes and failed conservative measures, the patient   decided to proceed with definitive treatment, total knee replacement.  Risks of infection, DVT, component failure, need  for revision surgery, neurovascular injury were reviewed in the office setting.  The postop course was reviewed stressing the efforts to maximize post-operative satisfaction and function.  Consent was obtained for benefit of pain   relief.   Laboratory Data: Admission on 05/26/2020, Discharged on 05/27/2020  Component Date Value Ref Range Status  . WBC 05/27/2020 12.5* 4.0 - 10.5 K/uL Final  . RBC 05/27/2020 3.93* 4.22 - 5.81 MIL/uL Final  . Hemoglobin 05/27/2020 12.1* 13.0 - 17.0 g/dL Final  . HCT 05/27/2020 37.0* 39.0 - 52.0 % Final  . MCV 05/27/2020 94.1  80.0 - 100.0 fL Final  . MCH 05/27/2020 30.8  26.0 - 34.0 pg Final  . MCHC 05/27/2020 32.7  30.0 - 36.0 g/dL Final  . RDW 05/27/2020 13.8  11.5 - 15.5 % Final  . Platelets 05/27/2020 184  150 - 400 K/uL Final  . nRBC 05/27/2020 0.0  0.0 - 0.2 % Final   Performed at [facility], [place] [address] [place], [place] [address]  . Sodium 05/27/2020 136  135 - 145 mmol/L Final  . Potassium 05/27/2020 4.4  3.5 - 5.1 mmol/L Final  . Chloride 05/27/2020 109  98 - 111 mmol/L Final  . CO2 05/27/2020 22  22 - 32 mmol/L Final  . Glucose, Bld 05/27/2020 219* 70 - 99 mg/dL Final   Glucose reference range applies only to samples taken after fasting for at least 8 hours.  . BUN 05/27/2020  25* 8 - 23 mg/dL Final  . Creatinine, Ser 05/27/2020 1.07  0.61 - 1.24 mg/dL Final  . Calcium 05/27/2020 9.8  8.9 - 10.3 mg/dL Final  . GFR, Estimated 05/27/2020 >60  >60 mL/min Final   Comment: (NOTE) Calculated using the CKD-EPI Creatinine Equation (2021)   . Anion gap 05/27/2020 5  5 - 15 Final   Performed at [facility], [place] [address] [place], [place] [address]  Hospital Outpatient Visit on 05/22/2020  Component Date Value Ref Range Status  . SARS Coronavirus 2 05/22/2020 NEGATIVE  NEGATIVE Final   Comment: (NOTE) SARS-CoV-2 target nucleic acids are NOT DETECTED.  The SARS-CoV-2 RNA is generally detectable in upper  and lower respiratory specimens during the acute phase of infection. Negative results do not preclude SARS-CoV-2 infection, do not rule out co-infections with other pathogens, and should not be used as the sole basis for treatment or other patient management decisions. Negative results must be combined with clinical observations, patient history, and epidemiological information. The expected result is Negative.  Fact Sheet for Patients: SugarRoll.be  Fact Sheet for Healthcare Providers: https://www.woods-mathews.com/  This test is not yet approved or cleared by the [place] FDA and  has been authorized for detection and/or diagnosis of SARS-CoV-2 by FDA under an Emergency Use Authorization (EUA). This EUA will remain  in effect (meaning this test can be used) for the duration of the COVID-19 declaration under Se                          ction 564(b)(1) of the Act, 21 U.S.C. section 360bbb-3(b)(1), unless the authorization is terminated or revoked sooner.  Performed at [facility], [place] [address], [place], [place] [address]   Hospital Outpatient Visit on 05/21/2020  Component Date Value Ref Range Status  . MRSA, PCR 05/21/2020 NEGATIVE  NEGATIVE Final  . Staphylococcus aureus 05/21/2020 NEGATIVE  NEGATIVE Final   Comment: (NOTE) The Xpert SA Assay (FDA approved for NASAL specimens in patients 42 years of age and older), is one component of a comprehensive surveillance program. It is not intended to diagnose infection nor to guide or monitor treatment. Performed at [facility], [place] [address] [place], [place] [address]   . ABO/RH(D) 05/21/2020 O POS   Final  . Antibody Screen 05/21/2020 NEG   Final  . Sample Expiration 05/21/2020 05/29/2020,2359   Final  . Extend sample reason 05/21/2020    Final                   Value:NO TRANSFUSIONS OR PREGNANCY IN THE PAST 3 MONTHS Performed at [facility], [place] [address] [place], [place] [address]   Office Visit on 05/19/2020  Component Date Value Ref Range Status  . WBC 05/19/2020 6.7  4.0 - 10.5 K/uL Final  . RBC 05/19/2020 4.66  4.22 - 5.81 Mil/uL Final  . Hemoglobin 05/19/2020 14.5  13.0 - 17.0 g/dL Final  . HCT 05/19/2020 43.1  39.0 - 52.0 % Final  . MCV 05/19/2020 92.4  78.0 - 100.0 fl Final  . MCHC 05/19/2020 33.7  30.0 - 36.0 g/dL Final  . RDW 05/19/2020 14.7  11.5 - 15.5 % Final  . Platelets 05/19/2020 197.0  150.0 - 400.0 K/uL Final  . Neutrophils Relative % 05/19/2020 60.9  43.0 - 77.0 % Final  . Lymphocytes Relative 05/19/2020 26.6  12.0 - 46.0 % Final  . Monocytes Relative  05/19/2020 8.6  3.0 - 12.0 % Final  . Eosinophils Relative 05/19/2020 3.2  0.0 - 5.0 % Final  . Basophils Relative 05/19/2020 0.7  0.0 - 3.0 % Final  . Neutro Abs 05/19/2020 4.1  1.4 - 7.7 K/uL Final  . Lymphs Abs 05/19/2020 1.8  0.7 - 4.0 K/uL Final  . Monocytes Absolute 05/19/2020 0.6  0.1 - 1.0 K/uL Final  . Eosinophils Absolute 05/19/2020 0.2  0.0 - 0.7 K/uL Final  . Basophils Absolute 05/19/2020 0.1  0.0 - 0.1 K/uL Final  . Color, Urine 05/19/2020 YELLOW  Yellow;Lt. Yellow;Straw;Dark Yellow;Amber;Green;Red;Brown Final  . APPearance 05/19/2020 CLEAR  Clear;Turbid;Slightly Cloudy;Cloudy Final  . Specific Gravity, Urine 05/19/2020 1.025  1.000 - 1.030 Final  . pH 05/19/2020 5.5  5.0 - 8.0 Final  . Total Protein, Urine 05/19/2020 NEGATIVE  Negative Final  . Urine Glucose 05/19/2020 NEGATIVE  Negative Final  . Ketones, ur 05/19/2020 NEGATIVE  Negative Final  . Bilirubin Urine 05/19/2020 NEGATIVE  Negative Final  . Hgb urine dipstick 05/19/2020 TRACE-INTACT* Negative Final  . Urobilinogen, UA 05/19/2020 0.2  0.0 - 1.0 Final  . Leukocytes,Ua 05/19/2020 NEGATIVE  Negative Final  . Nitrite 05/19/2020 NEGATIVE  Negative Final  . Sodium 05/19/2020 142  135 - 145 mEq/L Final  . Potassium 05/19/2020 4.0  3.5 - 5.1 mEq/L Final  . Chloride  05/19/2020 108  96 - 112 mEq/L Final  . CO2 05/19/2020 26  19 - 32 mEq/L Final  . Glucose, Bld 05/19/2020 94  70 - 99 mg/dL Final  . BUN 05/19/2020 18  6 - 23 mg/dL Final  . Creatinine, Ser 05/19/2020 0.99  0.40 - 1.50 mg/dL Final  . Total Bilirubin 05/19/2020 0.8  0.2 - 1.2 mg/dL Final  . Alkaline Phosphatase 05/19/2020 116  39 - 117 U/L Final  . AST 05/19/2020 23  0 - 37 U/L Final  . ALT 05/19/2020 22  0 - 53 U/L Final  . Total Protein 05/19/2020 7.3  6.0 - 8.3 g/dL Final  . Albumin 05/19/2020 4.3  3.5 - 5.2 g/dL Final  . GFR 05/19/2020 72.65  >60.00 mL/min Final   Calculated using the CKD-EPI Creatinine Equation (2021)  . Calcium 05/19/2020 10.5  8.4 - 10.5 mg/dL Final  . Hgb A1c MFr Bld 05/19/2020 6.1  4.6 - 6.5 % Final   Glycemic Control Guidelines for People with Diabetes:Non Diabetic:  <6%Goal of Therapy: <7%Additional Action Suggested:  >8%   . INR 05/19/2020 1.1* 0.8 - 1.0 ratio Final  . Prothrombin Time 05/19/2020 12.5  9.6 - 13.1 sec Final  Office Visit on 05/12/2020  Component Date Value Ref Range Status  . Cholesterol, Total 05/12/2020 151  100 - 199 mg/dL Final  . Triglycerides 05/12/2020 77  0 - 149 mg/dL Final  . HDL 05/12/2020 47  >39 mg/dL Final  . VLDL Cholesterol Cal 05/12/2020 15  5 - 40 mg/dL Final  . LDL Chol Calc (NIH) 05/12/2020 89  0 - 99 mg/dL Final  . Chol/HDL Ratio 05/12/2020 3.2  0.0 - 5.0 ratio Final   Comment:                                   T. Chol/HDL Ratio  Men  Women                               1/2 Avg.Risk  3.4    3.3                                   Avg.Risk  5.0    4.4                                2X Avg.Risk  9.6    7.1                                3X Avg.Risk 23.4   11.0   . Total Protein 05/12/2020 6.9  6.0 - 8.5 g/dL Final  . Albumin 05/12/2020 4.2  3.7 - 4.7 g/dL Final  . Bilirubin Total 05/12/2020 0.5  0.0 - 1.2 mg/dL Final  . Bilirubin, Direct 05/12/2020 0.16  0.00 - 0.40 mg/dL Final   . Alkaline Phosphatase 05/12/2020 127* 44 - 121 IU/L Final  . AST 05/12/2020 18  0 - 40 IU/L Final  . ALT 05/12/2020 21  0 - 44 IU/L Final     X-Rays:DG Chest 2 View  Result Date: 05/19/2020 CLINICAL DATA:  Preop. Dyspnea on exertion. Upcoming total knee replacement. EXAM: CHEST - 2 VIEW COMPARISON:  Radiograph 05/30/2019 CT 06/07/2019 FINDINGS: The heart is normal in size. Aortic tortuosity. There is a moderate retrocardiac hiatal hernia, unchanged from prior exam. Minimal scarring in the lingula. No acute airspace disease. No pleural fluid or pneumothorax. Flowing anterior syndesmophytes throughout the thoracic spine most consistent with DISH. No acute osseous abnormalities are seen. IMPRESSION: 1. No acute findings. 2. Moderate hiatal hernia. Electronically Signed   By: Keith Rake M.D.   On: 05/19/2020 18:31   VAS US CAROTID  Result Date: 05/19/2020 Carotid Arterial Duplex Study Indications:       Carotid artery disease and Patient denies any cerebrovascular                    symptoms today. Risk Factors:      Hypertension, hyperlipidemia, past history of smoking. Comparison Study:  Prior carotid duplex exam on 02/04/2019 showed highest                    velocities in right proximal ICA 163/61 cm/s and left mid ICA                    58/14 cm/s. Performing Technologist: Salvadore Dom RVT, RDCS (AE), RDMS  Examination Guidelines: A complete evaluation includes B-mode imaging, spectral Doppler, color Doppler, and power Doppler as needed of all accessible portions of each vessel. Bilateral testing is considered an integral part of a complete examination. Limited examinations for reoccurring indications may be performed as noted.  Right Carotid Findings: +----------+--------+--------+--------+--------------------+-------------------+           PSV cm/sEDV cm/sStenosisPlaque Description  Comments            +----------+--------+--------+--------+--------------------+-------------------+ CCA  Prox  125     17                                                      +----------+--------+--------+--------+--------------------+-------------------+  CCA Distal79      22                                                      +----------+--------+--------+--------+--------------------+-------------------+ ICA Prox  212     67      60-79%  irregular,          low end range                                         heterogenous and                                                          calcific                                +----------+--------+--------+--------+--------------------+-------------------+ ICA Mid   69      24                                  post stenotic                                                             turbulence          +----------+--------+--------+--------+--------------------+-------------------+ ICA Distal64      32                                                      +----------+--------+--------+--------+--------------------+-------------------+ ECA       118     14                                                      +----------+--------+--------+--------+--------------------+-------------------+ +----------+--------+-------+----------------+-------------------+           PSV cm/sEDV cmsDescribe        Arm Pressure (mmHG) +----------+--------+-------+----------------+-------------------+ [id number]            Multiphasic, [id number]                 +----------+--------+-------+----------------+-------------------+ +---------+--------+--+--------+-+---------+ VertebralPSV cm/s41EDV cm/s7Antegrade +---------+--------+--+--------+-+---------+  Left Carotid Findings: +----------+--------+--------+--------+------------------+--------+           PSV cm/sEDV cm/sStenosisPlaque DescriptionComments +----------+--------+--------+--------+------------------+--------+ CCA Prox  103     13                                          +----------+--------+--------+--------+------------------+--------+  CCA Distal56      14                                         +----------+--------+--------+--------+------------------+--------+ ICA Prox  44      13                                         +----------+--------+--------+--------+------------------+--------+ ICA Mid   61      18                                         +----------+--------+--------+--------+------------------+--------+ ICA Distal50      17                                         +----------+--------+--------+--------+------------------+--------+ ECA       132     10              focal and calcific         +----------+--------+--------+--------+------------------+--------+ +----------+--------+--------+----------------+-------------------+           PSV cm/sEDV cm/sDescribe        Arm Pressure (mmHG) +----------+--------+--------+----------------+-------------------+ [id number]             Multiphasic, [id number]                 +----------+--------+--------+----------------+-------------------+ +---------+--------+--+--------+--+---------+ VertebralPSV cm/s45EDV cm/s15Antegrade +---------+--------+--+--------+--+---------+   Summary: Right Carotid: Velocities in the right ICA are now consistent with a low end                range 60-79% stenosis. The ICA velocities have increased since                prior exam. Left Carotid: The extracranial vessels were near-normal with only minimal wall               thickening or plaque. Vertebrals:  Bilateral vertebral arteries demonstrate antegrade flow. Subclavians: Normal flow hemodynamics were seen in bilateral subclavian              arteries. *See table(s) above for measurements and observations. Suggest follow up study in 12 months. Electronically signed by Kathlyn Sacramento MD on 05/19/2020 at 5:09:50 PM.    Final     EKG: Orders placed or performed in visit on  05/12/20  . EKG 12-Lead     Hospital Course: David [name] is a 80 y.o. who was admitted to [facility]. They were brought to the operating room on 05/26/2020 and underwent Procedure(s): TOTAL KNEE ARTHROPLASTY.  Patient tolerated the procedure well and was later transferred to the recovery room and then to the orthopaedic floor for postoperative care. They were given PO and IV analgesics for pain control following their surgery. They were given 24 hours of postoperative antibiotics of  Anti-infectives (From admission, onward)   Start     Dose/Rate Route Frequency Ordered Stop   05/26/20 2000  ceFAZolin (ANCEF) IVPB 2g/100 mL premix        2 g 200 mL/hr over 30 Minutes Intravenous Every 6 hours 05/26/20 1730 05/27/20 0251   05/26/20 1245  ceFAZolin (ANCEF) IVPB 2g/100 mL premix        2 g 200 mL/hr over 30 Minutes Intravenous On call to O.R. 05/26/20 1230 05/26/20 1413     and started on DVT prophylaxis in the form of Aspirin.   PT and OT were ordered for total joint protocol. Discharge planning consulted to help with postop disposition and equipment needs.  Patient had a good night on the evening of surgery. They started to get up OOB with therapy on POD #1. Pt was seen during rounds and was ready to go home pending progress with therapy.He worked with therapy on POD #1 and was meeting his goals. Pt was discharged to home later that day in stable condition.  Diet: Regular diet Activity: WBAT Follow-up: in 2 weeks Disposition: Home Discharged Condition: good   Discharge Instructions    Call MD / Call 911   Complete by: As directed    If you experience chest pain or shortness of breath, CALL 911 and be transported to the hospital emergency room.  If you develope a fever above 101 F, pus (white drainage) or increased drainage or redness at the wound, or calf pain, call your surgeon's office.   Change dressing   Complete by: As directed    Maintain surgical dressing until  follow up in the clinic. If the edges start to pull up, may reinforce with tape. If the dressing is no longer working, may remove and cover with gauze and tape, but must keep the area dry and clean.  Call with any questions or concerns.   Constipation Prevention   Complete by: As directed    Drink plenty of fluids.  Prune juice may be helpful.  You may use a stool softener, such as Colace (over the counter) 100 mg twice a day.  Use MiraLax (over the counter) for constipation as needed.   Diet - low sodium heart healthy   Complete by: As directed    Discharge instructions   Complete by: As directed    Maintain surgical dressing until follow up in the clinic. If the edges start to pull up, may reinforce with tape. If the dressing is no longer working, may remove and cover with gauze and tape, but must keep the area dry and clean.  Follow up in 2 weeks at [facility] Call with any questions or concerns.   Increase activity slowly as tolerated   Complete by: As directed    Weight bearing as tolerated with assist device (walker, cane, etc) as directed, use it as long as suggested by your surgeon or therapist, typically at least 4-6 weeks.   TED hose   Complete by: As directed    Use stockings (TED hose) for 2 weeks on both leg(s).  You may remove them at night for sleeping.     Allergies as of 05/27/2020      Reactions   Codeine Itching   Severe hiccups   Ace Inhibitors Other (See Comments)   REACTION: Cough   Amlodipine    LE swelling   Percocet [oxycodone-acetaminophen]    Severe hiccups      Medication List    STOP taking these medications   aspirin EC 81 MG tablet Replaced by: aspirin 81 MG chewable tablet     TAKE these medications   aspirin 81 MG chewable tablet Chew 1 tablet (81 mg total) by mouth 2 (two) times daily for 28 days. Then resume normal dose of aspirin 81 mg daily. Replaces: aspirin  EC 81 MG tablet   atorvastatin 80 MG tablet Commonly known as: LIPITOR TAKE 1  TABLET(80 MG) BY MOUTH DAILY AT 6 PM What changed: See the new instructions.   celecoxib 200 MG capsule Commonly known as: CELEBREX Take 1 capsule (200 mg total) by mouth 2 (two) times daily.   cetirizine 10 MG tablet Commonly known as: ZYRTEC Take 1 tablet (10 mg total) by mouth daily. What changed:   when to take this  reasons to take this   docusate sodium 100 MG capsule Commonly known as: COLACE Take 1 capsule (100 mg total) by mouth 2 (two) times daily.   DULoxetine 30 MG capsule Commonly known as: CYMBALTA Take 1 capsule (30 mg total) by mouth daily.   fluticasone 50 MCG/ACT nasal spray Commonly known as: FLONASE SHAKE LIQUID AND USE 2 SPRAYS IN EACH NOSTRIL DAILY What changed: See the new instructions.   Hydrocortisone-Iodoquinol 1-1 % Crea Apply 1 application topically daily as needed (Rash on perineal).   HYDROmorphone 2 MG tablet Commonly known as: DILAUDID Take 1-2 tablets (2-4 mg total) by mouth every 6 (six) hours as needed for moderate pain or severe pain.   olmesartan 20 MG tablet Commonly known as: BENICAR TAKE 1 TABLET(20 MG) BY MOUTH DAILY What changed: See the new instructions.   pantoprazole 40 MG tablet Commonly known as: PROTONIX Take 1 tablet (40 mg total) by mouth 2 (two) times daily. Annual appt due in June must see provider for future refills   polyethylene glycol 17 g packet Commonly known as: MIRALAX / GLYCOLAX Take 17 g by mouth daily as needed for mild constipation.   sodium chloride 0.65 % nasal spray Commonly known as: OCEAN Place 1 spray into the nose daily as needed for congestion.   tiZANidine 4 MG tablet Commonly known as: Zanaflex Take 1 tablet (4 mg total) by mouth 3 (three) times daily for 10 days.   vitamin D3 50 MCG (2000 UT) Caps Take 2,000 Units by mouth daily.            Discharge Care Instructions  (From admission, onward)         Start     Ordered   05/27/20 0000  Change dressing       Comments:  Maintain surgical dressing until follow up in the clinic. If the edges start to pull up, may reinforce with tape. If the dressing is no longer working, may remove and cover with gauze and tape, but must keep the area dry and clean.  Call with any questions or concerns.   05/27/20 5035          Follow-up Information    Schedule an appointment as soon as possible for a visit with Paralee Cancel, MD.   Specialty: Orthopedic Surgery Contact information: [address] [place] [address] [place] [address] [phone]               Signed: Griffith Citron, PA-C Orthopedic Surgery 05/31/2020, 11:14 AM

## 2020-07-09 ENCOUNTER — Encounter: Payer: Self-pay | Admitting: Internal Medicine

## 2020-07-09 ENCOUNTER — Other Ambulatory Visit: Payer: Self-pay

## 2020-07-09 ENCOUNTER — Telehealth (INDEPENDENT_AMBULATORY_CARE_PROVIDER_SITE_OTHER): Payer: Medicare Other | Admitting: Internal Medicine

## 2020-07-09 DIAGNOSIS — U071 COVID-19: Secondary | ICD-10-CM | POA: Diagnosis not present

## 2020-07-09 DIAGNOSIS — I10 Essential (primary) hypertension: Secondary | ICD-10-CM

## 2020-07-09 DIAGNOSIS — R42 Dizziness and giddiness: Secondary | ICD-10-CM | POA: Insufficient documentation

## 2020-07-09 MED ORDER — PAXLOVID 20 X 150 MG & 10 X 100MG PO TBPK: 3.0000 | ORAL_TABLET | Freq: Two times a day (BID) | ORAL | 0 refills | Status: DC

## 2020-07-09 NOTE — Assessment & Plan Note (Addendum)
Will start on Paxlovid Rx.  Instructions for supportive care/symptom relief were emailed to the patient.  Go to ER if worse

## 2020-07-09 NOTE — Progress Notes (Signed)
Virtual Visit via Video Note  I connected with [name] on 07/09/20 at  8:10 AM EDT by a video enabled telemedicine application and verified that I am speaking with the correct person using two identifiers.   I discussed the limitations of evaluation and management by telemedicine and the availability of in person appointments. The patient expressed understanding and agreed to proceed.  I was located at our [facility] office. The patient was at home. Mrs Baumgarten is present in the visit.   History of Present Illness:  C/o COVID  There has runny nose, cough, fever x 2 d. T was 100.3. No chest pain, shortness of breath, abdominal pain, diarrhea, constipation. C/o ST. No skin rashes.  He felt lightheaded   Observations/Objective: The patient appears to be in no acute distress, hoarse  Assessment and Plan:  See my Assessment and Plan. Follow Up Instructions:    I discussed the assessment and treatment plan with the patient. The patient was provided an opportunity to ask questions and all were answered. The patient agreed with the plan and demonstrated an understanding of the instructions.   The patient was advised to call back or seek an in-person evaluation if the symptoms worsen or if the condition fails to improve as anticipated.  I provided face-to-face time during this encounter. We were at different locations.   Walker Kehr, MD

## 2020-07-09 NOTE — Assessment & Plan Note (Addendum)
Hold BP med (olmesartan) x 2-3 d due to lightheadedness

## 2020-07-09 NOTE — Assessment & Plan Note (Signed)
hold BP med (olmesartan) x 2-3 d due to lightheadedness Hydrate better

## 2020-08-13 DIAGNOSIS — I10 Essential (primary) hypertension: Secondary | ICD-10-CM

## 2020-08-13 MED ORDER — OLMESARTAN MEDOXOMIL 40 MG PO TABS: 40.0000 mg | ORAL_TABLET | Freq: Every day | ORAL | 3 refills | Status: AC

## 2020-08-21 LAB — BASIC METABOLIC PANEL
BUN/Creatinine Ratio: 18 (ref 10–24)
BUN: 22 mg/dL (ref 8–27)
CO2: 22 mmol/L (ref 20–29)
Calcium: 10.6 mg/dL — ABNORMAL HIGH (ref 8.6–10.2)
Chloride: 108 mmol/L — ABNORMAL HIGH (ref 96–106)
Creatinine, Ser: 1.2 mg/dL (ref 0.76–1.27)
Glucose: 102 mg/dL — ABNORMAL HIGH (ref 65–99)
Potassium: 4.8 mmol/L (ref 3.5–5.2)
Sodium: 143 mmol/L (ref 134–144)
eGFR: 62 mL/min/{1.73_m2} (ref >59)

## 2020-08-22 ENCOUNTER — Other Ambulatory Visit: Payer: Self-pay | Admitting: Internal Medicine

## 2020-08-22 DIAGNOSIS — M159 Polyosteoarthritis, unspecified: Secondary | ICD-10-CM

## 2020-10-01 ENCOUNTER — Encounter: Payer: Self-pay | Admitting: Internal Medicine

## 2020-10-01 ENCOUNTER — Other Ambulatory Visit: Payer: Self-pay

## 2020-10-01 ENCOUNTER — Ambulatory Visit: Payer: Medicare Other | Admitting: Internal Medicine

## 2020-10-01 VITALS — BP 118/60 | HR 72 | Temp 98.6°F | Ht 72.0 in | Wt 203.6 lb

## 2020-10-01 DIAGNOSIS — I10 Essential (primary) hypertension: Secondary | ICD-10-CM

## 2020-10-01 DIAGNOSIS — R0600 Dyspnea, unspecified: Secondary | ICD-10-CM

## 2020-10-01 DIAGNOSIS — R0609 Other forms of dyspnea: Secondary | ICD-10-CM

## 2020-10-01 DIAGNOSIS — U071 COVID-19: Secondary | ICD-10-CM

## 2020-10-01 DIAGNOSIS — K5900 Constipation, unspecified: Secondary | ICD-10-CM

## 2020-10-01 DIAGNOSIS — E785 Hyperlipidemia, unspecified: Secondary | ICD-10-CM

## 2020-10-01 MED ORDER — CEFDINIR 300 MG PO CAPS: 300.0000 mg | ORAL_CAPSULE | Freq: Two times a day (BID) | ORAL | 0 refills | Status: DC

## 2020-10-01 MED ORDER — METHYLPREDNISOLONE 4 MG PO TBPK: ORAL_TABLET | ORAL | 0 refills | Status: DC

## 2020-10-01 MED ORDER — SENNA-DOCUSATE SODIUM 8.6-50 MG PO TABS: 1.0000 | ORAL_TABLET | Freq: Two times a day (BID) | ORAL | 2 refills | Status: DC

## 2020-10-01 MED ORDER — ALIGN 4 MG PO CAPS: 1.0000 | ORAL_CAPSULE | Freq: Every day | ORAL | 1 refills | Status: DC

## 2020-10-01 NOTE — Progress Notes (Signed)
Subjective:  Patient ID: David [name], male    DOB: 1940/09/15  Age: 80 y.o. MRN: [id number]  CC: Cough (Pt states since dx w/ covid he has a dry cough ) and Constipation   HPI David [name] presents for a bad constipation after COVID in 5/22 - hard stools and scratchy throat, dry cough after eating, ST  Outpatient Medications Prior to Visit  Medication Sig Dispense Refill   atorvastatin (LIPITOR) 80 MG tablet TAKE 1 TABLET(80 MG) BY MOUTH DAILY AT 6 PM (Patient taking differently: Take 80 mg by mouth daily.) 90 tablet 3   cetirizine (ZYRTEC) 10 MG tablet Take 1 tablet (10 mg total) by mouth daily. (Patient taking differently: Take 10 mg by mouth daily as needed for allergies.) 90 tablet 2   DULoxetine (CYMBALTA) 30 MG capsule TAKE 1 CAPSULE(30 MG) BY MOUTH DAILY 90 capsule 1   fluticasone (FLONASE) 50 MCG/ACT nasal spray SHAKE LIQUID AND USE 2 SPRAYS IN EACH NOSTRIL DAILY (Patient taking differently: Place 2 sprays into both nostrils daily as needed for allergies.) 48 g 1   olmesartan (BENICAR) 40 MG tablet Take 1 tablet (40 mg total) by mouth daily. 90 tablet 3   pantoprazole (PROTONIX) 40 MG tablet Take 1 tablet (40 mg total) by mouth 2 (two) times daily. Annual appt due in June must see provider for future refills 60 tablet 3   sodium chloride (OCEAN) 0.65 % nasal spray Place 1 spray into the nose daily as needed for congestion.     Vitamin D, Cholecalciferol, 50 MCG (2000 UT) CAPS Take 2,000 Units by mouth daily.      celecoxib (CELEBREX) 200 MG capsule Take 1 capsule (200 mg total) by mouth 2 (two) times daily. (Patient not taking: Reported on 10/01/2020) 60 capsule 0   docusate sodium (COLACE) 100 MG capsule Take 1 capsule (100 mg total) by mouth 2 (two) times daily. (Patient not taking: Reported on 10/01/2020) 10 capsule 0   HYDROmorphone (DILAUDID) 2 MG tablet Take 1-2 tablets (2-4 mg total) by mouth every 6 (six) hours as needed for moderate pain or severe pain. (Patient not  taking: Reported on 10/01/2020) 56 tablet 0   Iodoquinol-HC (HYDROCORTISONE-IODOQUINOL) 1-1 % CREA Apply 1 application topically daily as needed (Rash on perineal).  (Patient not taking: Reported on 10/01/2020)  2   Nirmatrelvir & Ritonavir (PAXLOVID) 20 x 150 MG & 10 x '100MG'$  TBPK Take 3 tablets by mouth in the morning and at bedtime. As directed on a package (Patient not taking: Reported on 10/01/2020) 30 tablet 0   polyethylene glycol (MIRALAX / GLYCOLAX) 17 g packet Take 17 g by mouth daily as needed for mild constipation. (Patient not taking: Reported on 10/01/2020) 14 each 0   No facility-administered medications prior to visit.    ROS: Review of Systems  Constitutional:  Negative for appetite change, fatigue and unexpected weight change.  HENT:  Negative for congestion, nosebleeds, sneezing, sore throat and trouble swallowing.   Eyes:  Negative for itching and visual disturbance.  Respiratory:  Positive for cough.   Cardiovascular:  Negative for chest pain, palpitations and leg swelling.  Gastrointestinal:  Positive for constipation. Negative for abdominal distention, blood in stool, diarrhea and nausea.  Genitourinary:  Negative for frequency and hematuria.  Musculoskeletal:  Negative for back pain, gait problem, joint swelling and neck pain.  Skin:  Negative for rash.  Neurological:  Negative for dizziness, tremors, speech difficulty and weakness.  Psychiatric/Behavioral:  Negative for agitation, dysphoric  mood and sleep disturbance. The patient is not nervous/anxious.    Objective:  BP 118/60 (BP Location: Left Arm)   Pulse 72   Temp 98.6 F (37 C) (Oral)   Ht 6' (1.829 m)   Wt 203 lb 9.6 oz (92.4 kg)   SpO2 97%   BMI 27.61 kg/m   BP Readings from Last 3 Encounters:  10/01/20 118/60  05/27/20 (!) 151/86  05/21/20 134/72    Wt Readings from Last 3 Encounters:  10/01/20 203 lb 9.6 oz (92.4 kg)  05/26/20 196 lb (88.9 kg)  05/21/20 196 lb (88.9 kg)    Physical  Exam Constitutional:      General: He is not in acute distress.    Appearance: He is well-developed.     Comments: NAD  Eyes:     Conjunctiva/sclera: Conjunctivae normal.     Pupils: Pupils are equal, round, and reactive to light.  Neck:     Thyroid: No thyromegaly.     Vascular: No JVD.  Cardiovascular:     Rate and Rhythm: Normal rate and regular rhythm.     Heart sounds: Normal heart sounds. No murmur heard.   No friction rub. No gallop.  Pulmonary:     Effort: Pulmonary effort is normal. No respiratory distress.     Breath sounds: Normal breath sounds. No wheezing or rales.  Chest:     Chest wall: No tenderness.  Abdominal:     General: Bowel sounds are normal. There is no distension.     Palpations: Abdomen is soft. There is no mass.     Tenderness: There is no abdominal tenderness. There is no guarding or rebound.  Musculoskeletal:        General: No tenderness. Normal range of motion.     Cervical back: Normal range of motion.  Lymphadenopathy:     Cervical: No cervical adenopathy.  Skin:    General: Skin is warm and dry.     Findings: No rash.  Neurological:     Mental Status: He is alert and oriented to person, place, and time.     Cranial Nerves: No cranial nerve deficit.     Motor: No abnormal muscle tone.     Coordination: Coordination normal.     Gait: Gait normal.     Deep Tendon Reflexes: Reflexes are normal and symmetric.  Psychiatric:        Behavior: Behavior normal.        Thought Content: Thought content normal.        Judgment: Judgment normal.    Lab Results  Component Value Date   WBC 12.5 (H) 05/27/2020   HGB 12.1 (L) 05/27/2020   HCT 37.0 (L) 05/27/2020   PLT 184 05/27/2020   GLUCOSE 102 (H) 08/20/2020   CHOL 151 05/12/2020   TRIG 77 05/12/2020   HDL 47 05/12/2020   LDLDIRECT 122.1 03/05/2012   LDLCALC 89 05/12/2020   ALT 22 05/19/2020   AST 23 05/19/2020   NA 143 08/20/2020   K 4.8 08/20/2020   CL 108 (H) 08/20/2020   CREATININE  1.20 08/20/2020   BUN 22 08/20/2020   CO2 22 08/20/2020   TSH 1.42 05/30/2019   PSA 0.047 04/08/2017   INR 1.1 (H) 05/19/2020   HGBA1C 6.1 05/19/2020    No results found.  Assessment & Plan:    Walker Kehr, MD

## 2020-10-01 NOTE — Patient Instructions (Signed)

## 2020-10-06 DIAGNOSIS — K5900 Constipation, unspecified: Secondary | ICD-10-CM | POA: Insufficient documentation

## 2020-10-06 NOTE — Assessment & Plan Note (Addendum)
Resolving residual symptoms.  He took Paxlovid.  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  Medrol Dosepak.  He has cough.  He will continue to use over-the-counter Robitussin

## 2020-10-06 NOTE — Assessment & Plan Note (Signed)
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  Medrol Dosepak.

## 2020-10-06 NOTE — Assessment & Plan Note (Signed)
Continue current therapy with olmesartan

## 2020-10-06 NOTE — Assessment & Plan Note (Signed)
worse after COVID.  Will use Senokot-S.  Probiotic

## 2020-10-22 ENCOUNTER — Other Ambulatory Visit: Payer: Self-pay

## 2020-10-22 ENCOUNTER — Ambulatory Visit (INDEPENDENT_AMBULATORY_CARE_PROVIDER_SITE_OTHER)
Admission: RE | Admit: 2020-10-22 | Discharge: 2020-10-22 | Disposition: A | Discharge status: Home / Self Care | Payer: Self-pay | Source: Ambulatory Visit | Attending: Internal Medicine | Admitting: Internal Medicine

## 2020-10-22 DIAGNOSIS — E785 Hyperlipidemia, unspecified: Secondary | ICD-10-CM

## 2020-10-24 ENCOUNTER — Other Ambulatory Visit: Payer: Self-pay | Admitting: Internal Medicine

## 2020-10-24 DIAGNOSIS — R0609 Other forms of dyspnea: Secondary | ICD-10-CM

## 2020-10-24 DIAGNOSIS — I700 Atherosclerosis of aorta: Secondary | ICD-10-CM

## 2020-10-24 DIAGNOSIS — E785 Hyperlipidemia, unspecified: Secondary | ICD-10-CM

## 2020-10-24 DIAGNOSIS — R0600 Dyspnea, unspecified: Secondary | ICD-10-CM

## 2020-10-24 MED ORDER — ASPIRIN EC 81 MG PO TBEC: 81.0000 mg | DELAYED_RELEASE_TABLET | Freq: Every day | ORAL | 3 refills | Status: AC

## 2020-11-03 ENCOUNTER — Ambulatory Visit: Payer: Medicare Other

## 2020-11-04 ENCOUNTER — Ambulatory Visit (INDEPENDENT_AMBULATORY_CARE_PROVIDER_SITE_OTHER): Payer: Medicare Other | Admitting: *Deleted

## 2020-11-04 DIAGNOSIS — Z0000 Encounter for general adult medical examination without abnormal findings: Secondary | ICD-10-CM | POA: Diagnosis not present

## 2020-11-04 NOTE — Patient Instructions (Signed)
Health Maintenance, Male Adopting a healthy lifestyle and getting preventive care are important in promoting health and wellness. Ask your health care provider about: The right schedule for you to have regular tests and exams. Things you can do on your own to prevent diseases and keep yourself healthy. What should I know about diet, weight, and exercise? Eat a healthy diet  Eat a diet that includes plenty of vegetables, fruits, low-fat dairy products, and lean protein. Do not eat a lot of foods that are high in solid fats, added sugars, or sodium. Maintain a healthy weight Body mass index (BMI) is a measurement that can be used to identify possible weight problems. It estimates body fat based on height and weight. Your health care provider can help determine your BMI and help you achieve or maintain a healthy weight. Get regular exercise Get regular exercise. This is one of the most important things you can do for your health. Most adults should: Exercise for at least 150 minutes each week. The exercise should increase your heart rate and make you sweat (moderate-intensity exercise). Do strengthening exercises at least twice a week. This is in addition to the moderate-intensity exercise. Spend less time sitting. Even light physical activity can be beneficial. Watch cholesterol and blood lipids Have your blood tested for lipids and cholesterol at 79 years of age, then have this test every 5 years. You may need to have your cholesterol levels checked more often if: Your lipid or cholesterol levels are high. You are older than 79 years of age. You are at high risk for heart disease. What should I know about cancer screening? Many types of cancers can be detected early and may often be prevented. Depending on your health history and family history, you may need to have cancer screening at various ages. This may include screening for: Colorectal cancer. Prostate cancer. Skin cancer. Lung  cancer. What should I know about heart disease, diabetes, and high blood pressure? Blood pressure and heart disease High blood pressure causes heart disease and increases the risk of stroke. This is more likely to develop in people who have high blood pressure readings, are of African descent, or are overweight. Talk with your health care provider about your target blood pressure readings. Have your blood pressure checked: Every 3-5 years if you are 18-39 years of age. Every year if you are 40 years old or older. If you are between the ages of 65 and 75 and are a current or former smoker, ask your health care provider if you should have a one-time screening for abdominal aortic aneurysm (AAA). Diabetes Have regular diabetes screenings. This checks your fasting blood sugar level. Have the screening done: Once every three years after age 45 if you are at a normal weight and have a low risk for diabetes. More often and at a younger age if you are overweight or have a high risk for diabetes. What should I know about preventing infection? Hepatitis B If you have a higher risk for hepatitis B, you should be screened for this virus. Talk with your health care provider to find out if you are at risk for hepatitis B infection. Hepatitis C Blood testing is recommended for: Everyone born from 1945 through 1965. Anyone with known risk factors for hepatitis C. Sexually transmitted infections (STIs) You should be screened each year for STIs, including gonorrhea and chlamydia, if: You are sexually active and are younger than 79 years of age. You are older than 79 years   of age and your health care provider tells you that you are at risk for this type of infection. Your sexual activity has changed since you were last screened, and you are at increased risk for chlamydia or gonorrhea. Ask your health care provider if you are at risk. Ask your health care provider about whether you are at high risk for HIV.  Your health care provider may recommend a prescription medicine to help prevent HIV infection. If you choose to take medicine to prevent HIV, you should first get tested for HIV. You should then be tested every 3 months for as long as you are taking the medicine. Follow these instructions at home: Lifestyle Do not use any products that contain nicotine or tobacco, such as cigarettes, e-cigarettes, and chewing tobacco. If you need help quitting, ask your health care provider. Do not use street drugs. Do not share needles. Ask your health care provider for help if you need support or information about quitting drugs. Alcohol use Do not drink alcohol if your health care provider tells you not to drink. If you drink alcohol: Limit how much you have to 0-2 drinks a day. Be aware of how much alcohol is in your drink. In the [place] one drink equals one 12 oz bottle of beer (355 mL), one 5 oz glass of wine (148 mL), or one 1 oz glass of hard liquor (44 mL). General instructions Schedule regular health, dental, and eye exams. Stay current with your vaccines. Tell your health care provider if: You often feel depressed. You have ever been abused or do not feel safe at home. Summary Adopting a healthy lifestyle and getting preventive care are important in promoting health and wellness. Follow your health care provider's instructions about healthy diet, exercising, and getting tested or screened for diseases. Follow your health care provider's instructions on monitoring your cholesterol and blood pressure. This information is not intended to replace advice given to you by your health care provider. Make sure you discuss any questions you have with your health care provider. Document Revised: 04/24/2020 Document Reviewed: 02/07/2018 [employer] Patient Education  2022 [employer]  

## 2020-11-04 NOTE — Progress Notes (Addendum)
Subjective:   David [name] is a 80 y.o. male who presents for Medicare Annual/Subsequent preventive examination.  I connected with  David [name] on 11/04/20 by audio enabled telemedicine application and verified that I am speaking with the correct person using two identifiers.   I discussed the limitations of evaluation and management by telemedicine. The patient expressed understanding and agreed to proceed.   Location of Patient: Home Location of Provider: Home Persons participating in visit: David [name] (patient) & Jari Favre, CMA  Review of Systems    Defer to PCP Cardiac Risk Factors include: none     Objective:    There were no vitals filed for this visit. There is no height or weight on file to calculate BMI.  Advanced Directives 11/04/2020 05/26/2020 05/21/2020 08/06/2019 09/26/2017 09/23/2016 07/01/2015  Does Patient Have a Medical Advance Directive? Yes Yes Yes Yes Yes Yes Yes  Type of Paramedic of [place];Living will;Out of facility DNR (pink MOST or yellow form) [place];Living will [place];Living will [place];Living will - Living will;Healthcare Power of [place];Living will  Does patient want to make changes to medical advance directive? No - Patient declined No - Patient declined - No - Patient declined - - No - Patient declined  Copy of [place] in Chart? Yes - validated most recent copy scanned in chart (See row information) No - copy requested - No - copy requested - No - copy requested No - copy requested  Pre-existing out of facility DNR order (yellow form or pink MOST form) - - - - - - -    Current Medications (verified) Outpatient Encounter Medications as of 11/04/2020  Medication Sig   aspirin EC 81 MG tablet Take 1 tablet (81 mg total) by mouth daily.   atorvastatin (LIPITOR) 80 MG tablet TAKE 1 TABLET(80 MG) BY MOUTH DAILY AT 6  PM (Patient taking differently: Take 80 mg by mouth daily.)   cefdinir (OMNICEF) 300 MG capsule Take 1 capsule (300 mg total) by mouth 2 (two) times daily.   cetirizine (ZYRTEC) 10 MG tablet Take 1 tablet (10 mg total) by mouth daily. (Patient taking differently: Take 10 mg by mouth daily as needed for allergies.)   DULoxetine (CYMBALTA) 30 MG capsule TAKE 1 CAPSULE(30 MG) BY MOUTH DAILY   fluticasone (FLONASE) 50 MCG/ACT nasal spray SHAKE LIQUID AND USE 2 SPRAYS IN EACH NOSTRIL DAILY (Patient taking differently: Place 2 sprays into both nostrils daily as needed for allergies.)   methylPREDNISolone (MEDROL DOSEPAK) 4 MG TBPK tablet As directed   olmesartan (BENICAR) 40 MG tablet Take 1 tablet (40 mg total) by mouth daily.   pantoprazole (PROTONIX) 40 MG tablet Take 1 tablet (40 mg total) by mouth 2 (two) times daily. Annual appt due in June must see provider for future refills   Probiotic Product (ALIGN) 4 MG CAPS Take 1 capsule (4 mg total) by mouth daily.   sennosides-docusate sodium (SENOKOT-S) 8.6-50 MG tablet Take 1 tablet by mouth 2 (two) times daily.   sodium chloride (OCEAN) 0.65 % nasal spray Place 1 spray into the nose daily as needed for congestion.   Vitamin D, Cholecalciferol, 50 MCG (2000 UT) CAPS Take 2,000 Units by mouth daily.    No facility-administered encounter medications on file as of 11/04/2020.    Allergies (verified) Codeine, Ace inhibitors, Amlodipine, and Percocet [oxycodone-acetaminophen]   History: Past Medical History:  Diagnosis Date  Allergy    Arthritis    gen.  and left hip   BPH (benign prostatic hypertrophy)    Coronary artery disease    GERD (gastroesophageal reflux disease)    occasional   History of kidney stones    2007   Hyperlipidemia    Hypertension    Kidney tumor    LBP (low back pain) 06/2005   L5 radicular symptoms; MRI of LS spine severe spondylosis at L4-L5 with central cancal stenosis    Nocturia    Organic impotence    Prostate  cancer ([place]) 10/2011   (Low grade) [facility] - Dr. Junious Silk   Pyelonephritis 03/05/2013   Renal oncocytoma of right kidney 01/23/2013   As 3.2 x 3.5 cm enhancing exophytic mass projecting off the lower pole. This is consistent with a renal cell carcinoma. No retroperitoneal lymphadenopathy.    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   Wears glasses    Past Surgical History:  Procedure Laterality Date   COLONOSCOPY  02/15/2006, 2014   Internal hemorrhoids (Dr [name]), last in 2014   [place] Bilateral 02/15/2013   Procedure: LYMPHADENECTOMY WITH INDOCYANINE GREEN DYE INJECTION;  Surgeon: [name], MD;  Location: [facility];  Service: Urology;  Laterality: Bilateral;   PROSTATE BIOPSY N/A 12/18/2012   Procedure: BIOPSY TRANSRECTAL ULTRASONIC PROSTATE (TUBP);  Surgeon: [name], MD;  Location: [facility];  Service: Urology;  Laterality: N/A;   PROSTATE BIOPSY  12/05/11   gleason 6, 3/12 cores   REPAIR UMBILICAL AND VENTRAL HERNIA'S W/ MESH  08-21-2009   RIGHT/LEFT HEART CATH AND CORONARY ANGIOGRAPHY N/A 08/06/2019   Procedure: RIGHT/LEFT HEART CATH AND CORONARY ANGIOGRAPHY;  Surgeon: [name], Peter M, MD;  Location: [facility];  Service: Cardiovascular;  Laterality: N/A;   ROBOT ASSISTED LAPAROSCOPIC RADICAL PROSTATECTOMY N/A 02/15/2013   Procedure: ROBOTIC ASSISTED LAPAROSCOPIC RADICAL PROSTATECTOMY;  Surgeon: [name], MD;  Location: [facility];  Service: Urology;  Laterality: N/A;   ROBOTIC ASSITED PARTIAL NEPHRECTOMY Right 02/15/2013   Procedure: ROBOTIC ASSITED PARTIAL NEPHRECTOMY;  Surgeon: [name], MD;  Location: [facility];  Service: Urology;  Laterality: Right;   SPERMATOCELECTOMY Left 12/18/2012   Procedure: LEFT SPERMATOCELECTOMY;  Surgeon: [name], MD;  Location: [facility];  Service: Urology;  Laterality: Left;   TONSILLECTOMY AND ADENOIDECTOMY  as child   TOTAL  KNEE ARTHROPLASTY Left 05/26/2020   Procedure: TOTAL KNEE ARTHROPLASTY;  Surgeon: Paralee Cancel, MD;  Location: [facility];  Service: Orthopedics;  Laterality: Left;  70 mins   TRANSURETHRAL RESECTION OF PROSTATE  AGE 63   Family History  Problem Relation Age of Onset   Cancer Mother        colon   Colon cancer Mother 63   Pulmonary fibrosis Father        father owned a Radio broadcast assistant (father blamed inhalation of chemicals and fumes)   Pulmonary fibrosis Brother    Coronary artery disease Other    Hyperlipidemia Other    Colon polyps Neg Hx    Esophageal cancer Neg Hx    Rectal cancer Neg Hx    Stomach cancer Neg Hx    Social History   Socioeconomic History   Marital status: Married    Spouse name: Not on file   Number of children: 2   Years of education: Not on file   Highest education level: Not on file  Occupational History   Not on  file  Tobacco Use   Smoking status: Former    Packs/day: 0.25    Years: 10.00    Pack years: 2.50    Types: Cigarettes    Quit date: 02/28/1981    Years since quitting: 39.7   Smokeless tobacco: Never  Vaping Use   Vaping Use: Never used  Substance and Sexual Activity   Alcohol use: Yes    Alcohol/week: 1.0 standard drink    Types: 1 Glasses of wine per week    Comment: occas.   Drug use: Not Currently    Types: Flunitrazepam   Sexual activity: Not on file  Other Topics Concern   Not on file  Social History Narrative   Married- 60 yrs   2 daughters   5 grandchildren   Never Smoked   Alcohol use-yes (occasional/social)   Retired- Engineer, water      Physician roster:      Urologist - Dr. [name]   Orthopedics - Dr. [name]   Dermatology - Dr. [name]   Social Determinants of Health   Financial Resource Strain: Low Risk    Difficulty of Paying Living Expenses: Not hard at all  Food Insecurity: No Food Insecurity   Worried About Charity fundraiser in the Last Year: Never true   [place] in the Last Year: Never true   Transportation Needs: No Transportation Needs   Lack of Transportation (Medical): No   Lack of Transportation (Non-Medical): No  Physical Activity: Insufficiently Active   Days of Exercise per Week: 3 days   Minutes of Exercise per Session: 30 min  Stress: No Stress Concern Present   Feeling of Stress : Not at all  Social Connections: Moderately Integrated   Frequency of Communication with Friends and Family: More than three times a week   Frequency of Social Gatherings with Friends and Family: Once a week   Attends Religious Services: More than 4 times per year   Active Member of [employer] or Organizations: No   Attends Music therapist: Never   Marital Status: Married    Tobacco Counseling Counseling given: Not Answered   Clinical Intake:  Pre-visit preparation completed: No  Pain : 0-10 Pain Type: Neuropathic pain Pain Location: Knee Pain Orientation: Left Pain Onset: More than a month ago Pain Frequency: Intermittent Pain Relieving Factors: Since Knee surgery  Pain Relieving Factors: Since Knee surgery  Diabetes: No  How often do you need to have someone help you when you read instructions, pamphlets, or other written materials from your doctor or pharmacy?: 1 - Never  Diabetic? No  Interpreter Needed?: No      Activities of Daily Living In your present state of health, do you have any difficulty performing the following activities: 11/04/2020 05/26/2020  Hearing? N N  Vision? N N  Difficulty concentrating or making decisions? N N  Walking or climbing stairs? N N  Dressing or bathing? N N  Doing errands, shopping? N N  Preparing Food and eating ? N -  Using the Toilet? N -  In the past six months, have you accidently leaked urine? Y -  Comment H/O Prostate Cancer -  Do you have problems with loss of bowel control? N -  Managing your Medications? N -  Managing your Finances? N -  Housekeeping or managing your Housekeeping? N -  Some recent data  might be hidden    Patient Care Team: Plotnikov, [name], MD as PCP - General (  Internal Medicine) Stanford Breed Denice Bors, MD as PCP - Cardiology (Cardiology) Stanford Breed Denice Bors, MD as Consulting Physician (Cardiology) [facility], [facility] Jarome Matin, MD as Consulting Physician (Dermatology) [name], MD as Consulting Physician (Urology)  Indicate any recent Medical Services you may have received from other than [facility] providers in the past year (date may be approximate).     Assessment:   This is a routine wellness examination for [place].  Hearing/Vision screen No results found.  Dietary issues and exercise activities discussed: Current Exercise Habits: Home exercise routine, Type of exercise: walking, Time (Minutes): 30, Frequency (Times/Week): 3, Weekly Exercise (Minutes/Week): 90, Intensity: Mild, Exercise limited by: None identified   Goals Addressed   None    Depression Screen PHQ 2/9 Scores 11/04/2020 05/19/2020 05/19/2020 10/05/2018 09/26/2017 09/23/2016 06/25/2015  PHQ - 2 Score 0 0 0 0 0 0 0  PHQ- 9 Score - 0 - - - - -    Fall Risk Fall Risk  11/04/2020 05/19/2020 10/05/2018 09/26/2017 09/23/2016  Falls in the past year? 1 1 0 No No  Number falls in past yr: 0 1 0 - -  Injury with Fall? 1 1 0 - -  Risk for fall due to : - Impaired balance/gait;Orthopedic patient Orthopedic patient - -  Follow up - - Education provided - -    FALL RISK PREVENTION PERTAINING TO THE HOME:  Any stairs in or around the home? Yes  If so, are there any without handrails? Yes  Home free of loose throw rugs in walkways, pet beds, electrical cords, etc? No  Adequate lighting in your home to reduce risk of falls? Yes   ASSISTIVE DEVICES UTILIZED TO PREVENT FALLS:  Life alert? No  Use of a cane, walker or w/c? No  Grab bars in the bathroom? Yes  Shower chair or bench in shower? Yes  Elevated toilet seat or a handicapped toilet? Yes   TIMED UP AND GO:  Was the test performed? No .     Cognitive Function: MMSE - Mini Mental State Exam 09/26/2017  Not completed: (No Data)     6CIT Screen 11/04/2020  What Year? 0 points  What month? 0 points  What time? 0 points  Count back from 20 0 points  Months in reverse 0 points  Repeat phrase 0 points  Total Score 0    Immunizations Immunization History  Administered Date(s) Administered   Fluad Quad(high Dose 65+) 10/30/2018   Influenza Split 11/18/2010, 11/23/2011   Influenza Whole 11/27/2007, 11/26/2008, 11/20/2009   Influenza, High Dose Seasonal PF 11/24/2014, 11/18/2015, 12/12/2016   Influenza,inj,Quad PF,6+ Mos 11/23/2012, 11/15/2013   Influenza-Unspecified 12/05/2017   Moderna SARS-COV2 Booster Vaccination 08/28/2019   Moderna Sars-Covid-2 Vaccination 04/15/2019, 05/14/2019   Pneumococcal Conjugate-13 11/15/2013   Pneumococcal Polysaccharide-23 03/06/2008   Td 03/06/2008   Zoster Recombinat (Shingrix) 08/06/2017, 12/29/2017    TDAP status: Due, Education has been provided regarding the importance of this vaccine. Advised may receive this vaccine at local pharmacy or Health Dept. Aware to provide a copy of the vaccination record if obtained from local pharmacy or Health Dept. Verbalized acceptance and understanding.  Flu Vaccine status: Declined, Education has been provided regarding the importance of this vaccine but patient still declined. Advised may receive this vaccine at local pharmacy or Health Dept. Aware to provide a copy of the vaccination record if obtained from local pharmacy or Health Dept. Verbalized acceptance and understanding.  Pneumococcal vaccine status: Up to date  Covid-19 vaccine status: Information  provided on how to obtain vaccines.   Qualifies for Shingles Vaccine? Yes   Zostavax completed Yes   Shingrix Completed?: Yes  Screening Tests Health Maintenance  Topic Date Due   COVID-19 Vaccine (4 - Booster for Moderna series) 11/20/2020 (Originally 11/28/2019)   OPHTHALMOLOGY EXAM   01/04/2021 (Originally 10/02/2019)   INFLUENZA VACCINE  05/28/2021 (Originally 09/28/2020)   Hepatitis C Screening  11/04/2021 (Originally 04/08/1958)   COLONOSCOPY (Pts 45-46yr Insurance coverage will need to be confirmed)  02/12/2021   PNA vac Low Risk Adult  Completed   Zoster Vaccines- Shingrix  Completed   HPV VACCINES  Aged Out   FOOT EXAM  Discontinued   HEMOGLOBIN A1C  Discontinued   TETANUS/TDAP  Discontinued    Health Maintenance  There are no preventive care reminders to display for this patient.   Colorectal cancer screening: Type of screening: Colonoscopy. Completed 02/12/2018. Repeat every 3 years  Lung Cancer Screening: (Low Dose CT Chest recommended if Age 79-80years, 30 pack-year currently smoking OR have quit w/in 15years.) does not qualify.    Additional Screening:  Hepatitis C Screening: does qualify; Not Completed  Vision Screening: Recommended annual ophthalmology exams for early detection of glaucoma and other disorders of the eye. Is the patient up to date with their annual eye exam?  No  Who is the provider or what is the name of the office in which the patient attends annual eye exams[place] is not established with a provider, would they like to be referred to a provider to establish care? No .   Dental Screening: Recommended annual dental exams for proper oral hygiene  Community Resource Referral / Chronic Care Management: CRR required this visit?  No   CCM required this visit?  No      Plan:     I have personally reviewed and noted the following in the patient's chart:   Medical and social history Use of alcohol, tobacco or illicit drugs  Current medications and supplements including opioid prescriptions. Patient is not currently taking opioid prescriptions. Functional ability and status Nutritional status Physical activity Advanced directives List of other physicians Hospitalizations, surgeries, and ER visits in previous 12  months Vitals Screenings to include cognitive, depression, and falls Referrals and appointments  In addition, I have reviewed and discussed with patient certain preventive protocols, quality metrics, and best practice recommendations. A written personalized care plan for preventive services as well as general preventive health recommendations were provided to patient.     LCannon Kettle CWeed  11/04/2020   Nurse Notes: 26 minutes non face to face  Medical screening examination/treatment/procedure(s) were performed by non-physician practitioner and as supervising physician I was immediately available for consultation/collaboration.  I agree with above. ALew Dawes MD

## 2020-11-10 ENCOUNTER — Other Ambulatory Visit: Payer: Self-pay | Admitting: Internal Medicine

## 2020-11-10 ENCOUNTER — Ambulatory Visit: Payer: Medicare Other

## 2020-11-10 NOTE — Progress Notes (Signed)
HPI: FU dyspnea. Abdominal CT December 2015 showed no aneurysm but there was note of coronary calcification. Echocardiogram April 2021 showed normal LV systolic function, grade 1 diastolic dysfunction, mild left atrial enlargement and mildly dilated aortic root measuring 42 to 43 mm. CTA April 2021 showed no pulmonary embolus; atherosclerosis of great vessels, aorta and coronary arteries noted. Abdominal ultrasound April 2021 unrevealing.  PFTs 5/21 showed mild obstruction. Cardiac cath 6/21 showed mild nonobstructive CAD, normal right heart pressures, low LV filling pressure and no shunt. Seen by pulmonary and etiology of dyspnea unclear.  Carotid Dopplers March 2022 showed 60 to 79% right and near normal left.  Calcium score August 2022 1341, atherosclerosis of the aorta and aortic valve calcification noted.  Since last seen patient continues with dyspnea on exertion but denies orthopnea or PND.  Minimal pedal edema.  No chest pain, palpitations or syncope.  Current Outpatient Medications  Medication Sig Dispense Refill   aspirin EC 81 MG tablet Take 1 tablet (81 mg total) by mouth daily. 100 tablet 3   atorvastatin (LIPITOR) 80 MG tablet TAKE 1 TABLET(80 MG) BY MOUTH DAILY AT 6 PM (Patient taking differently: Take 80 mg by mouth daily.) 90 tablet 3   benzonatate (TESSALON) 200 MG capsule Take 1 capsule (200 mg total) by mouth 3 (three) times daily as needed for cough. 60 capsule 1   cetirizine (ZYRTEC) 10 MG tablet Take 1 tablet (10 mg total) by mouth daily. (Patient taking differently: Take 10 mg by mouth daily as needed for allergies.) 90 tablet 2   DULoxetine (CYMBALTA) 30 MG capsule TAKE 1 CAPSULE(30 MG) BY MOUTH DAILY 90 capsule 1   fluticasone (FLONASE) 50 MCG/ACT nasal spray SHAKE LIQUID AND USE 2 SPRAYS IN EACH NOSTRIL DAILY (Patient taking differently: Place 2 sprays into both nostrils daily as needed for allergies.) 48 g 1   olmesartan (BENICAR) 40 MG tablet Take 1 tablet (40 mg  total) by mouth daily. 90 tablet 3   pantoprazole (PROTONIX) 40 MG tablet TAKE 1 TABLET BY MOUTH TWICE DAILY 60 tablet 11   polyethylene glycol powder (GLYCOLAX/MIRALAX) 17 GM/SCOOP powder Take 17 g by mouth 2 (two) times daily as needed for moderate constipation or mild constipation. 500 g 1   Probiotic Product (ALIGN) 4 MG CAPS Take 1 capsule (4 mg total) by mouth daily. 30 capsule 1   sennosides-docusate sodium (SENOKOT-S) 8.6-50 MG tablet Take 1 tablet by mouth 2 (two) times daily. 60 tablet 2   sodium chloride (OCEAN) 0.65 % nasal spray Place 1 spray into the nose daily as needed for congestion.     Vitamin D, Cholecalciferol, 50 MCG (2000 UT) CAPS Take 2,000 Units by mouth daily.      No current facility-administered medications for this visit.     Past Medical History:  Diagnosis Date   Allergy    Arthritis    gen.  and left hip   BPH (benign prostatic hypertrophy)    Coronary artery disease    GERD (gastroesophageal reflux disease)    occasional   History of kidney stones    2007   Hyperlipidemia    Hypertension    Kidney tumor    LBP (low back pain) 06/2005   L5 radicular symptoms; MRI of LS spine severe spondylosis at L4-L5 with central cancal stenosis    Nocturia    Organic impotence    Prostate cancer ([place]) 10/2011   (Low grade) [facility] - Dr. Junious Silk  Pyelonephritis 03/05/2013   Renal oncocytoma of right kidney 01/23/2013   As 3.2 x 3.5 cm enhancing exophytic mass projecting off the lower pole. This is consistent with a renal cell carcinoma. No retroperitoneal lymphadenopathy.    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   Wears glasses     Past Surgical History:  Procedure Laterality Date   COLONOSCOPY  02/15/2006, 2014   Internal hemorrhoids (Dr [name]), last in 2014   [place] Bilateral 02/15/2013   Procedure: LYMPHADENECTOMY WITH INDOCYANINE GREEN DYE INJECTION;  Surgeon: [name], MD;  Location: [facility];   Service: Urology;  Laterality: Bilateral;   PROSTATE BIOPSY N/A 12/18/2012   Procedure: BIOPSY TRANSRECTAL ULTRASONIC PROSTATE (TUBP);  Surgeon: [name], MD;  Location: [facility];  Service: Urology;  Laterality: N/A;   PROSTATE BIOPSY  12/05/11   gleason 6, 3/12 cores   REPAIR UMBILICAL AND VENTRAL HERNIA'S W/ MESH  08-21-2009   RIGHT/LEFT HEART CATH AND CORONARY ANGIOGRAPHY N/A 08/06/2019   Procedure: RIGHT/LEFT HEART CATH AND CORONARY ANGIOGRAPHY;  Surgeon: [name], Peter M, MD;  Location: [facility];  Service: Cardiovascular;  Laterality: N/A;   ROBOT ASSISTED LAPAROSCOPIC RADICAL PROSTATECTOMY N/A 02/15/2013   Procedure: ROBOTIC ASSISTED LAPAROSCOPIC RADICAL PROSTATECTOMY;  Surgeon: [name], MD;  Location: [facility];  Service: Urology;  Laterality: N/A;   ROBOTIC ASSITED PARTIAL NEPHRECTOMY Right 02/15/2013   Procedure: ROBOTIC ASSITED PARTIAL NEPHRECTOMY;  Surgeon: [name], MD;  Location: [facility];  Service: Urology;  Laterality: Right;   SPERMATOCELECTOMY Left 12/18/2012   Procedure: LEFT SPERMATOCELECTOMY;  Surgeon: [name], MD;  Location: [facility];  Service: Urology;  Laterality: Left;   TONSILLECTOMY AND ADENOIDECTOMY  as child   TOTAL KNEE ARTHROPLASTY Left 05/26/2020   Procedure: TOTAL KNEE ARTHROPLASTY;  Surgeon: Paralee Cancel, MD;  Location: [facility];  Service: Orthopedics;  Laterality: Left;  70 mins   TRANSURETHRAL RESECTION OF PROSTATE  AGE 80    Social History   Socioeconomic History   Marital status: Married    Spouse name: Not on file   Number of children: 2   Years of education: Not on file   Highest education level: Not on file  Occupational History   Not on file  Tobacco Use   Smoking status: Former    Packs/day: 0.25    Years: 10.00    Pack years: 2.50    Types: Cigarettes    Quit date: 02/28/1981    Years since quitting: 39.7   Smokeless tobacco: Never  Vaping Use   Vaping Use:  Never used  Substance and Sexual Activity   Alcohol use: Yes    Alcohol/week: 1.0 standard drink    Types: 1 Glasses of wine per week    Comment: occas.   Drug use: Not Currently    Types: Flunitrazepam   Sexual activity: Not on file  Other Topics Concern   Not on file  Social History Narrative   Married- 28 yrs   2 daughters   5 grandchildren   Never Smoked   Alcohol use-yes (occasional/social)   Retired- Engineer, water      Physician roster:      Urologist - Dr. [name]   Orthopedics - Dr. [name]   Dermatology - Dr. [name]   Social Determinants of Health   Financial Resource Strain: Low Risk    Difficulty of Paying Living Expenses: Not hard at all  Food Insecurity: No  Food Insecurity   Worried About Charity fundraiser in the Last Year: Never true   Ran Out of Food in the Last Year: Never true  Transportation Needs: No Transportation Needs   Lack of Transportation (Medical): No   Lack of Transportation (Non-Medical): No  Physical Activity: Insufficiently Active   Days of Exercise per Week: 3 days   Minutes of Exercise per Session: 30 min  Stress: No Stress Concern Present   Feeling of Stress : Not at all  Social Connections: Moderately Integrated   Frequency of Communication with Friends and Family: More than three times a week   Frequency of Social Gatherings with Friends and Family: Once a week   Attends Religious Services: More than 4 times per year   Active Member of [employer] or Organizations: No   Attends Music therapist: Never   Marital Status: Married  Human resources officer Violence: Not At Risk   Fear of Current or Ex-Partner: No   Emotionally Abused: No   Physically Abused: No   Sexually Abused: No    Family History  Problem Relation Age of Onset   Cancer Mother        colon   Colon cancer Mother 65   Pulmonary fibrosis Father        father owned a Medical sales representative business (father blamed inhalation of chemicals and fumes)   Pulmonary fibrosis  Brother    Coronary artery disease Other    Hyperlipidemia Other    Colon polyps Neg Hx    Esophageal cancer Neg Hx    Rectal cancer Neg Hx    Stomach cancer Neg Hx     ROS: no fevers or chills, productive cough, hemoptysis, dysphasia, odynophagia, melena, hematochezia, dysuria, hematuria, rash, seizure activity, orthopnea, PND, pedal edema, claudication. Remaining systems are negative.  Physical Exam: Well-developed well-nourished in no acute distress.  Skin is warm and dry.  HEENT is normal.  Neck is supple.  Chest is clear to auscultation with normal expansion.  Cardiovascular exam is irregular Abdominal exam nontender or distended. No masses palpated. Extremities show trace edema. neuro grossly intact  ECG-normal sinus rhythm at a rate of 76, occasional PAC, IVCD.  Personally reviewed  A/P  1 dyspnea-as outlined above etiology has been unclear.  No clear cardiac etiology identified.  He will continue to follow-up with pulmonary.  2 coronary artery disease-based on elevated coronary calcification.  No previous catheterization did not reveal significant obstructive disease.  Plan to continue medical therapy with aspirin and statin.  3 carotid artery disease-plan follow-up Dopplers March 2023.  4 hypertension-blood pressure mildly elevated but controlled at home.  Continue present medications and follow.  5 hyperlipidemia-continue statin.  Check lipids and liver.  Goal LDL less than 70  [name], MD

## 2020-11-13 ENCOUNTER — Other Ambulatory Visit: Payer: Self-pay

## 2020-11-16 ENCOUNTER — Encounter: Payer: Self-pay | Admitting: Internal Medicine

## 2020-11-16 ENCOUNTER — Other Ambulatory Visit: Payer: Self-pay

## 2020-11-16 ENCOUNTER — Ambulatory Visit: Payer: Medicare Other | Admitting: Internal Medicine

## 2020-11-16 DIAGNOSIS — K5900 Constipation, unspecified: Secondary | ICD-10-CM | POA: Diagnosis not present

## 2020-11-16 DIAGNOSIS — I10 Essential (primary) hypertension: Secondary | ICD-10-CM

## 2020-11-16 DIAGNOSIS — R059 Cough, unspecified: Secondary | ICD-10-CM

## 2020-11-16 DIAGNOSIS — R0600 Dyspnea, unspecified: Secondary | ICD-10-CM

## 2020-11-16 DIAGNOSIS — I700 Atherosclerosis of aorta: Secondary | ICD-10-CM

## 2020-11-16 DIAGNOSIS — R0609 Other forms of dyspnea: Secondary | ICD-10-CM

## 2020-11-16 DIAGNOSIS — R739 Hyperglycemia, unspecified: Secondary | ICD-10-CM

## 2020-11-16 MED ORDER — POLYETHYLENE GLYCOL 3350 17 GM/SCOOP PO POWD: 17.0000 g | Freq: Two times a day (BID) | ORAL | 1 refills | Status: DC | PRN

## 2020-11-16 MED ORDER — BENZONATATE 200 MG PO CAPS: 200.0000 mg | ORAL_CAPSULE | Freq: Three times a day (TID) | ORAL | 1 refills | Status: DC | PRN

## 2020-11-16 NOTE — Assessment & Plan Note (Signed)
Olmesartan

## 2020-11-16 NOTE — Progress Notes (Signed)
Subjective:  Patient ID: David [name], male    DOB: [birth year]  Age: 80 y.o. MRN: [id number]  CC: Follow-up (6 week f/u)   HPI David [name] presents for post-COVID cough, constipation. C/o cough - not better on Senokot-S. F/u CAD  Outpatient Medications Prior to Visit  Medication Sig Dispense Refill   aspirin EC 81 MG tablet Take 1 tablet (81 mg total) by mouth daily. 100 tablet 3   atorvastatin (LIPITOR) 80 MG tablet TAKE 1 TABLET(80 MG) BY MOUTH DAILY AT 6 PM (Patient taking differently: Take 80 mg by mouth daily.) 90 tablet 3   cetirizine (ZYRTEC) 10 MG tablet Take 1 tablet (10 mg total) by mouth daily. (Patient taking differently: Take 10 mg by mouth daily as needed for allergies.) 90 tablet 2   DULoxetine (CYMBALTA) 30 MG capsule TAKE 1 CAPSULE(30 MG) BY MOUTH DAILY 90 capsule 1   fluticasone (FLONASE) 50 MCG/ACT nasal spray SHAKE LIQUID AND USE 2 SPRAYS IN EACH NOSTRIL DAILY (Patient taking differently: Place 2 sprays into both nostrils daily as needed for allergies.) 48 g 1   olmesartan (BENICAR) 40 MG tablet Take 1 tablet (40 mg total) by mouth daily. 90 tablet 3   pantoprazole (PROTONIX) 40 MG tablet TAKE 1 TABLET BY MOUTH TWICE DAILY 60 tablet 11   Probiotic Product (ALIGN) 4 MG CAPS Take 1 capsule (4 mg total) by mouth daily. 30 capsule 1   sennosides-docusate sodium (SENOKOT-S) 8.6-50 MG tablet Take 1 tablet by mouth 2 (two) times daily. 60 tablet 2   sodium chloride (OCEAN) 0.65 % nasal spray Place 1 spray into the nose daily as needed for congestion.     Vitamin D, Cholecalciferol, 50 MCG (2000 UT) CAPS Take 2,000 Units by mouth daily.      cefdinir (OMNICEF) 300 MG capsule Take 1 capsule (300 mg total) by mouth 2 (two) times daily. (Patient not taking: Reported on 11/16/2020) 20 capsule 0   methylPREDNISolone (MEDROL DOSEPAK) 4 MG TBPK tablet As directed (Patient not taking: Reported on 11/16/2020) 21 tablet 0   No facility-administered medications prior to visit.     ROS: Review of Systems  Constitutional:  Negative for appetite change, fatigue and unexpected weight change.  HENT:  Negative for congestion, nosebleeds, sneezing, sore throat and trouble swallowing.   Eyes:  Negative for itching and visual disturbance.  Respiratory:  Positive for cough.   Cardiovascular:  Negative for chest pain, palpitations and leg swelling.  Gastrointestinal:  Positive for constipation. Negative for abdominal distention, blood in stool, diarrhea and nausea.  Genitourinary:  Negative for frequency and hematuria.  Musculoskeletal:  Negative for back pain, gait problem, joint swelling and neck pain.  Skin:  Negative for rash.  Neurological:  Negative for dizziness, tremors, speech difficulty and weakness.  Psychiatric/Behavioral:  Negative for agitation, dysphoric mood and sleep disturbance. The patient is not nervous/anxious.    Objective:  BP 118/62 (BP Location: Left Arm)   Pulse (!) 58   Temp 98.7 F (37.1 C) (Oral)   Ht 6' (1.829 m)   Wt 207 lb 12.8 oz (94.3 kg)   SpO2 96%   BMI 28.18 kg/m   BP Readings from Last 3 Encounters:  11/16/20 118/62  10/01/20 118/60  05/27/20 (!) 151/86    Wt Readings from Last 3 Encounters:  11/16/20 207 lb 12.8 oz (94.3 kg)  10/01/20 203 lb 9.6 oz (92.4 kg)  05/26/20 196 lb (88.9 kg)    Physical Exam Constitutional:  General: He is not in acute distress.    Appearance: He is well-developed.     Comments: NAD  Eyes:     Conjunctiva/sclera: Conjunctivae normal.     Pupils: Pupils are equal, round, and reactive to light.  Neck:     Thyroid: No thyromegaly.     Vascular: No JVD.  Cardiovascular:     Rate and Rhythm: Normal rate and regular rhythm.     Heart sounds: Normal heart sounds. No murmur heard.   No friction rub. No gallop.  Pulmonary:     Effort: Pulmonary effort is normal. No respiratory distress.     Breath sounds: Normal breath sounds. No wheezing or rales.  Chest:     Chest wall: No  tenderness.  Abdominal:     General: Bowel sounds are normal. There is no distension.     Palpations: Abdomen is soft. There is no mass.     Tenderness: There is no abdominal tenderness. There is no guarding or rebound.  Musculoskeletal:        General: No tenderness. Normal range of motion.     Cervical back: Normal range of motion.  Lymphadenopathy:     Cervical: No cervical adenopathy.  Skin:    General: Skin is warm and dry.     Findings: No rash.  Neurological:     Mental Status: He is alert and oriented to person, place, and time.     Cranial Nerves: No cranial nerve deficit.     Motor: No abnormal muscle tone.     Coordination: Coordination normal.     Gait: Gait normal.     Deep Tendon Reflexes: Reflexes are normal and symmetric.  Psychiatric:        Behavior: Behavior normal.        Thought Content: Thought content normal.        Judgment: Judgment normal.    Lab Results  Component Value Date   WBC 12.5 (H) 05/27/2020   HGB 12.1 (L) 05/27/2020   HCT 37.0 (L) 05/27/2020   PLT 184 05/27/2020   GLUCOSE 102 (H) 08/20/2020   CHOL 151 05/12/2020   TRIG 77 05/12/2020   HDL 47 05/12/2020   LDLDIRECT 122.1 03/05/2012   LDLCALC 89 05/12/2020   ALT 22 05/19/2020   AST 23 05/19/2020   NA 143 08/20/2020   K 4.8 08/20/2020   CL 108 (H) 08/20/2020   CREATININE 1.20 08/20/2020   BUN 22 08/20/2020   CO2 22 08/20/2020   TSH 1.42 05/30/2019   PSA 0.047 04/08/2017   INR 1.1 (H) 05/19/2020   HGBA1C 6.1 05/19/2020    CT CARDIAC SCORING (SELF PAY ONLY)  Addendum Date: 10/23/2020   ADDENDUM REPORT: 10/23/2020 11:07 CLINICAL DATA:  48M for cardiovascular disease risk stratification EXAM: Coronary Calcium Score TECHNIQUE: A gated, non-contrast computed tomography scan of the heart was performed using 76m slice thickness. Axial images were analyzed on a dedicated workstation. Calcium scoring of the coronary arteries was performed using the Agatston method. FINDINGS: Coronary  arteries: Normal origins. Coronary Calcium Score: Left main: 0 Left anterior descending artery: 987 Left circumflex artery: 339 Right coronary artery: 15.4 Total: 1341 Percentile: 78th Pericardium: Normal. Ascending Aorta: Mildly dilated.  3.9 cm.  Aortic atherosclerosis. Calcification of the aortic valve. [facility]card[facility]: [facility] IMPRESSION: Coronary calcium score of 1341. This was 78th percentile for age-, race-, and sex-matched controls. Atherosclerosis of the aorta. Aortic valve calcification. RECOMMENDATIONS: Coronary artery calcium (CAC) score is a  strong predictor of incident coronary heart disease (CHD) and provides predictive information beyond traditional risk factors. CAC scoring is reasonable to use in the decision to withhold, postpone, or initiate statin therapy in intermediate-risk or selected borderline-risk asymptomatic adults (age 42-75 years and LDL-C >=70 to <190 mg/dL) who do not have diabetes or established atherosclerotic cardiovascular disease (ASCVD).* In intermediate-risk (10-year ASCVD risk >=7.5% to <20%) adults or selected borderline-risk (10-year ASCVD risk >=5% to <7.5%) adults in whom a CAC score is measured for the purpose of making a treatment decision the following recommendations have been made: If CAC=0, it is reasonable to withhold statin therapy and reassess in 5 to 10 years, as long as higher risk conditions are absent (diabetes mellitus, family history of premature CHD in first degree relatives (males <55 years; females <65 years), cigarette smoking, or LDL >=190 mg/dL). If CAC is 1 to 99, it is reasonable to initiate statin therapy for patients >=74 years of age. If CAC is >=100 or >=75th percentile, it is reasonable to initiate statin therapy at any age. Cardiology referral should be considered for patients with CAC scores >=400 or >=75th percentile. *2018 AHA/ACC/AACVPR/AAPA/ABC/ACPM/ADA/AGS/APhA/ASPC/NLA/PCNA Guideline on the Management  of Blood Cholesterol: A Report of the American College of Cardiology/American Heart Association Task Force on Clinical Practice Guidelines. J Am Coll Cardiol. 2019;73(24):3168-3209. Skeet Latch, MD Electronically Signed   By: Skeet Latch M.D.   On: 10/23/2020 11:07   Result Date: 10/23/2020 EXAM: OVER-READ INTERPRETATION  CT CHEST The following report is an over-read performed by radiologist Dr. Abigail Miyamoto of [facility], [place] on 10/22/2020. This over-read does not include interpretation of cardiac or coronary anatomy or pathology. The calcium score interpretation by the cardiologist is attached. COMPARISON:  05/19/2020 chest radiograph.  CTA chest 06/07/2019 FINDINGS: Vascular: Aortic atherosclerosis.  Tortuous thoracic aorta. Mediastinum/Nodes: No imaged thoracic adenopathy. Moderate to large hiatal hernia, similar. Lungs/Pleura: No pleural fluid. 5 mm right lower lobe pulmonary nodule on 17/4 is similar to on the prior and considered benign. Upper Abdomen: Normal imaged portions of the liver, spleen, adrenal glands, right kidney. Colonic stool burden suggests constipation. Musculoskeletal: Osteopenia. Accentuation of expected thoracic kyphosis. Diffuse idiopathic skeletal hyperostosis. IMPRESSION: 1.  No acute findings in the imaged extracardiac chest. 2.  Aortic Atherosclerosis (ICD10-I70.0). 3. Hiatal hernia. 4.  Possible constipation. Electronically Signed: By: Abigail Miyamoto M.D. On: 10/22/2020 15:01    Assessment & Plan:   Problem List Items Addressed This Visit   None     Follow-up: No follow-ups on file.  Walker Kehr, MD

## 2020-11-16 NOTE — Assessment & Plan Note (Addendum)
Not better Continue Senakot S. Start Miralax 17 g once or twice a day Colonoscopy - pending (Dr Havery Moros)

## 2020-11-16 NOTE — Assessment & Plan Note (Signed)
Check A1c. 

## 2020-11-16 NOTE — Assessment & Plan Note (Signed)
Discussed coronary calcium CT score is 1341. It is consistent with an advanced degree of coronary atherosclerosis. Interestingly,  coronary disease on the heart catheterization from 2021 is described as mild with calcifications, however.  Lipitor and baby aspirin. [facility]mark will see Dr Stanford Breed tomorrow

## 2020-11-16 NOTE — Assessment & Plan Note (Signed)
Cont w/Lipitor 

## 2020-11-16 NOTE — Assessment & Plan Note (Signed)
Post-COVID - refractory Given Tessalon Perles prn

## 2020-11-17 ENCOUNTER — Ambulatory Visit: Payer: Medicare Other | Admitting: Cardiology

## 2020-11-17 ENCOUNTER — Encounter: Payer: Self-pay | Admitting: Cardiology

## 2020-11-17 VITALS — BP 142/60 | HR 50 | Ht 72.0 in | Wt 208.4 lb

## 2020-11-17 DIAGNOSIS — I2510 Atherosclerotic heart disease of native coronary artery without angina pectoris: Secondary | ICD-10-CM

## 2020-11-17 DIAGNOSIS — I10 Essential (primary) hypertension: Secondary | ICD-10-CM

## 2020-11-17 DIAGNOSIS — E785 Hyperlipidemia, unspecified: Secondary | ICD-10-CM | POA: Diagnosis not present

## 2020-11-17 NOTE — Patient Instructions (Signed)

## 2020-11-18 LAB — LIPID PANEL
Chol/HDL Ratio: 3.6 ratio (ref 0.0–5.0)
Cholesterol, Total: 172 mg/dL (ref 100–199)
HDL: 48 mg/dL (ref >39)
LDL Chol Calc (NIH): 106 mg/dL — ABNORMAL HIGH (ref 0–99)
Triglycerides: 99 mg/dL (ref 0–149)
VLDL Cholesterol Cal: 18 mg/dL (ref 5–40)

## 2020-11-18 LAB — HEPATIC FUNCTION PANEL
ALT: 17 IU/L (ref 0–44)
AST: 23 IU/L (ref 0–40)
Albumin: 4.2 g/dL (ref 3.7–4.7)
Alkaline Phosphatase: 135 IU/L — ABNORMAL HIGH (ref 44–121)
Bilirubin Total: 0.5 mg/dL (ref 0.0–1.2)
Bilirubin, Direct: 0.15 mg/dL (ref 0.00–0.40)
Total Protein: 6.3 g/dL (ref 6.0–8.5)

## 2020-11-30 ENCOUNTER — Telehealth: Payer: Self-pay | Admitting: *Deleted

## 2020-11-30 DIAGNOSIS — E785 Hyperlipidemia, unspecified: Secondary | ICD-10-CM

## 2020-11-30 MED ORDER — EZETIMIBE 10 MG PO TABS: 10.0000 mg | ORAL_TABLET | Freq: Every day | ORAL | 3 refills | Status: DC

## 2020-11-30 NOTE — Telephone Encounter (Signed)
pt aware of results  New script sent to the pharmacy  Lab orders mailed to the pt  

## 2020-11-30 NOTE — Telephone Encounter (Signed)
-----   Message from Lelon Perla, MD sent at 11/19/2020  7:19 AM EDT ----- Add zetia 10 mg daily; lipids and liver 12 weeks [name]

## 2020-12-03 ENCOUNTER — Other Ambulatory Visit: Payer: Self-pay | Admitting: Cardiology

## 2020-12-03 DIAGNOSIS — E7800 Pure hypercholesterolemia, unspecified: Secondary | ICD-10-CM

## 2021-01-14 ENCOUNTER — Other Ambulatory Visit: Payer: Self-pay

## 2021-01-14 ENCOUNTER — Encounter: Payer: Self-pay | Admitting: Internal Medicine

## 2021-01-14 ENCOUNTER — Ambulatory Visit: Payer: Medicare Other | Admitting: Internal Medicine

## 2021-01-14 VITALS — BP 128/64 | HR 65 | Temp 98.7°F | Ht 72.0 in | Wt 205.0 lb

## 2021-01-14 DIAGNOSIS — R739 Hyperglycemia, unspecified: Secondary | ICD-10-CM

## 2021-01-14 DIAGNOSIS — J329 Chronic sinusitis, unspecified: Secondary | ICD-10-CM | POA: Diagnosis not present

## 2021-01-14 DIAGNOSIS — J309 Allergic rhinitis, unspecified: Secondary | ICD-10-CM

## 2021-01-14 DIAGNOSIS — I10 Essential (primary) hypertension: Secondary | ICD-10-CM

## 2021-01-14 MED ORDER — FLUTICASONE PROPIONATE 50 MCG/ACT NA SUSP: 2.0000 | Freq: Every day | NASAL | 11 refills | Status: DC | PRN

## 2021-01-14 MED ORDER — DOXYCYCLINE HYCLATE 100 MG PO TABS: 100.0000 mg | ORAL_TABLET | Freq: Two times a day (BID) | ORAL | 0 refills | Status: DC

## 2021-01-14 NOTE — Patient Instructions (Signed)
Please take all new medication as prescribed - the antibiotic  Please continue all other medications as before, and refills have been done if requested - the flonase  Please have the pharmacy call with any other refills you may need.  Please continue your efforts at being more active, low cholesterol diet, and weight control.  Please keep your appointments with your specialists as you may have planned

## 2021-01-14 NOTE — Progress Notes (Signed)
Patient ID: David [name], male   DOB: Sep 05, 1940, 80 y.o.   MRN: [id number]        Chief Complaint: follow up sinus symptoms       HPI:  David [name] is a 80 y.o. male  Here with 2-3 wks acute onset fever, facial pain, pressure, headache, general weakness and malaise, and greenish d/c, with mild ST and cough, but pt denies chest pain, wheezing, increased sob or doe, orthopnea, PND, increased LE swelling, palpitations, dizziness or syncope.  Also - Does have several wks ongoing nasal allergy symptoms with clearish congestion, itch and sneezing, without fever, pain, ST, cough, swelling or wheezing.  Admits to non compliance with allergy meds.  No other complaints   Pt denies polydipsia, polyuria,       Wt Readings from Last 3 Encounters:  01/14/21 205 lb (93 kg)  11/17/20 208 lb 6.4 oz (94.5 kg)  11/16/20 207 lb 12.8 oz (94.3 kg)   BP Readings from Last 3 Encounters:  01/14/21 128/64  11/17/20 (!) 142/60  11/16/20 118/62         Past Medical History:  Diagnosis Date   Allergy    Arthritis    gen.  and left hip   BPH (benign prostatic hypertrophy)    Coronary artery disease    GERD (gastroesophageal reflux disease)    occasional   History of kidney stones    2007   Hyperlipidemia    Hypertension    Kidney tumor    LBP (low back pain) 06/2005   L5 radicular symptoms; MRI of LS spine severe spondylosis at L4-L5 with central cancal stenosis    Nocturia    Organic impotence    Prostate cancer ([place]) 10/2011   (Low grade) [facility] - Dr. Junious Silk   Pyelonephritis 03/05/2013   Renal oncocytoma of right kidney 01/23/2013   As 3.2 x 3.5 cm enhancing exophytic mass projecting off the lower pole. This is consistent with a renal cell carcinoma. No retroperitoneal lymphadenopathy.    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   Wears glasses    Past Surgical History:  Procedure Laterality Date   COLONOSCOPY  02/15/2006, 2014   Internal hemorrhoids (Dr [name]), last in 2014    [place] Bilateral 02/15/2013   Procedure: LYMPHADENECTOMY WITH INDOCYANINE GREEN DYE INJECTION;  Surgeon: [name], MD;  Location: [facility];  Service: Urology;  Laterality: Bilateral;   PROSTATE BIOPSY N/A 12/18/2012   Procedure: BIOPSY TRANSRECTAL ULTRASONIC PROSTATE (TUBP);  Surgeon: [name], MD;  [facility];  Service: Urology;  Laterality: N/A;   PROSTATE BIOPSY  12/05/11   gleason 6, 3/12 cores   REPAIR UMBILICAL AND VENTRAL HERNIA'S W/ MESH  08-21-2009   RIGHT/LEFT HEART CATH AND CORONARY ANGIOGRAPHY N/A 08/06/2019   Procedure: RIGHT/LEFT HEART CATH AND CORONARY ANGIOGRAPHY;  Surgeon: [name], Peter M, MD;  Location: [facility];  Service: Cardiovascular;  Laterality: N/A;   ROBOT ASSISTED LAPAROSCOPIC RADICAL PROSTATECTOMY N/A 02/15/2013   Procedure: ROBOTIC ASSISTED LAPAROSCOPIC RADICAL PROSTATECTOMY;  Surgeon: [name], MD;  Location: [facility];  Service: Urology;  Laterality: N/A;   ROBOTIC ASSITED PARTIAL NEPHRECTOMY Right 02/15/2013   Procedure: ROBOTIC ASSITED PARTIAL NEPHRECTOMY;  Surgeon: [name], MD;  Location: [facility];  Service: Urology;  Laterality: Right;   SPERMATOCELECTOMY Left 12/18/2012   Procedure: LEFT SPERMATOCELECTOMY;  Surgeon: [name], MD;  Location[facility];  Service: Urology;  Laterality: Left;   TONSILLECTOMY AND ADENOIDECTOMY  as child   TOTAL KNEE ARTHROPLASTY Left 05/26/2020   Procedure: TOTAL KNEE ARTHROPLASTY;  Surgeon: Paralee Cancel, MD;  Location: [facility];  Service: Orthopedics;  Laterality: Left;  70 mins   TRANSURETHRAL RESECTION OF PROSTATE  AGE 89    reports that he quit smoking about 39 years ago. His smoking use included cigarettes. He has a 2.50 pack-year smoking history. He has never used smokeless tobacco. He reports current alcohol use of about 1.0 standard drink per week. He reports that he does not currently use  drugs after having used the following drugs: Flunitrazepam. family history includes Cancer in his mother; Colon cancer (age of onset: 46) in his mother; Coronary artery disease in an other family member; Hyperlipidemia in an other family member; Pulmonary fibrosis in his brother and father. Allergies  Allergen Reactions   Codeine Itching    Severe hiccups   Ace Inhibitors Other (See Comments)    REACTION: Cough   Amlodipine     LE swelling   Percocet [Oxycodone-Acetaminophen]     Severe hiccups   Current Outpatient Medications on File Prior to Visit  Medication Sig Dispense Refill   aspirin EC 81 MG tablet Take 1 tablet (81 mg total) by mouth daily. 100 tablet 3   atorvastatin (LIPITOR) 80 MG tablet TAKE 1 TABLET(80 MG) BY MOUTH DAILY AT 6 PM 90 tablet 3   benzonatate (TESSALON) 200 MG capsule Take 1 capsule (200 mg total) by mouth 3 (three) times daily as needed for cough. 60 capsule 1   cetirizine (ZYRTEC) 10 MG tablet Take 1 tablet (10 mg total) by mouth daily. (Patient taking differently: Take 10 mg by mouth daily as needed for allergies.) 90 tablet 2   DULoxetine (CYMBALTA) 30 MG capsule TAKE 1 CAPSULE(30 MG) BY MOUTH DAILY 90 capsule 1   ezetimibe (ZETIA) 10 MG tablet Take 1 tablet (10 mg total) by mouth daily. 90 tablet 3   olmesartan (BENICAR) 40 MG tablet Take 1 tablet (40 mg total) by mouth daily. 90 tablet 3   pantoprazole (PROTONIX) 40 MG tablet TAKE 1 TABLET BY MOUTH TWICE DAILY 60 tablet 11   sodium chloride (OCEAN) 0.65 % nasal spray Place 1 spray into the nose daily as needed for congestion.     Vitamin D, Cholecalciferol, 50 MCG (2000 UT) CAPS Take 2,000 Units by mouth daily.      No current facility-administered medications on file prior to visit.        ROS:  All others reviewed and negative.  Objective        PE:  BP 128/64 (BP Location: Left Arm, Patient Position: Sitting, Cuff Size: Large)   Pulse 65   Temp 98.7 F (37.1 C) (Oral)   Ht 6' (1.829 m)   Wt 205  lb (93 kg)   SpO2 100%   BMI 27.80 kg/m                 Constitutional: Pt appears in NAD               HENT: Head: NCAT.                Right Ear: External ear normal.                 Left Ear: External ear normal.                Eyes: . Pupils are equal, round, and reactive  to light. Conjunctivae and EOM are normal, Bilat tm's with mild erythema.  Max sinus areas mild tender.  Pharynx with mild erythema, no exudate               Nose: without d/c or deformity               Neck: Neck supple. Gross normal ROM               Cardiovascular: Normal rate and regular rhythm.                 Pulmonary/Chest: Effort normal and breath sounds without rales or wheezing.                               Neurological: Pt is alert. At baseline orientation, motor grossly intact               Skin: Skin is warm. No rashes, no other new lesions, LE edema - none               Psychiatric: Pt behavior is normal without agitation   Micro: none  Cardiac tracings I have personally interpreted today:  none  Pertinent Radiological findings (summarize): none   Lab Results  Component Value Date   WBC 12.5 (H) 05/27/2020   HGB 12.1 (L) 05/27/2020   HCT 37.0 (L) 05/27/2020   PLT 184 05/27/2020   GLUCOSE 102 (H) 08/20/2020   CHOL 172 11/18/2020   TRIG 99 11/18/2020   HDL 48 11/18/2020   LDLDIRECT 122.1 03/05/2012   LDLCALC 106 (H) 11/18/2020   ALT 17 11/18/2020   AST 23 11/18/2020   NA 143 08/20/2020   K 4.8 08/20/2020   CL 108 (H) 08/20/2020   CREATININE 1.20 08/20/2020   BUN 22 08/20/2020   CO2 22 08/20/2020   TSH 1.42 05/30/2019   PSA 0.047 04/08/2017   INR 1.1 (H) 05/19/2020   HGBA1C 6.1 05/19/2020   Assessment/Plan:  David [name] is a 80 y.o. White or Caucasian [1] male with  has a past medical history of Allergy, Arthritis, BPH (benign prostatic hypertrophy), Coronary artery disease, GERD (gastroesophageal reflux disease), History of kidney stones, Hyperlipidemia, Hypertension, Kidney  tumor, LBP (low back pain) (06/2005), Nocturia, Organic impotence, Prostate cancer ([place]) (10/2011), Pyelonephritis (03/05/2013), Renal oncocytoma of right kidney (01/23/2013), Spermatocele, Urinoma (03/05/2013), and Wears glasses.  Allergic rhinitis Mild to mod uncontroleld, for restart flonase and continue zyrtec prn,  to f/u any worsening symptoms or concerns  Essential hypertension BP Readings from Last 3 Encounters:  01/14/21 128/64  11/17/20 (!) 142/60  11/16/20 118/62   Stable, pt to continue medical treatment benicar   Hyperglycemia Lab Results  Component Value Date   HGBA1C 6.1 05/19/2020   Stable, pt to continue current medical treatment  - diet   Sinusitis Mild to mod, for antibx course,  to f/u any worsening symptoms or concerns  Followup: Return if symptoms worsen or fail to improve.  Cathlean Cower, MD 01/16/2021 9:40 PM [facility]

## 2021-01-16 ENCOUNTER — Encounter: Payer: Self-pay | Admitting: Internal Medicine

## 2021-01-16 DIAGNOSIS — J329 Chronic sinusitis, unspecified: Secondary | ICD-10-CM | POA: Insufficient documentation

## 2021-01-16 NOTE — Assessment & Plan Note (Signed)
Mild to mod uncontroleld, for restart flonase and continue zyrtec prn,  to f/u any worsening symptoms or concerns

## 2021-01-16 NOTE — Assessment & Plan Note (Signed)
Mild to mod, for antibx course,  to f/u any worsening symptoms or concerns 

## 2021-01-16 NOTE — Assessment & Plan Note (Signed)
Lab Results  Component Value Date   HGBA1C 6.1 05/19/2020   Stable, pt to continue current medical treatment  - diet

## 2021-01-16 NOTE — Assessment & Plan Note (Signed)
BP Readings from Last 3 Encounters:  01/14/21 128/64  11/17/20 (!) 142/60  11/16/20 118/62   Stable, pt to continue medical treatment benicar

## 2021-02-23 ENCOUNTER — Other Ambulatory Visit: Payer: Self-pay

## 2021-02-23 ENCOUNTER — Ambulatory Visit
Admission: RE | Admit: 2021-02-23 | Discharge: 2021-02-23 | Disposition: A | Discharge status: Home / Self Care | Payer: Medicare Other | Source: Ambulatory Visit | Attending: Emergency Medicine | Admitting: Emergency Medicine

## 2021-02-23 VITALS — BP 157/88 | HR 79 | Temp 99.0°F | Resp 18

## 2021-02-23 DIAGNOSIS — R0982 Postnasal drip: Secondary | ICD-10-CM

## 2021-02-23 DIAGNOSIS — R051 Acute cough: Secondary | ICD-10-CM

## 2021-02-23 DIAGNOSIS — T7840XA Allergy, unspecified, initial encounter: Secondary | ICD-10-CM

## 2021-02-23 DIAGNOSIS — R062 Wheezing: Secondary | ICD-10-CM

## 2021-02-23 DIAGNOSIS — J309 Allergic rhinitis, unspecified: Secondary | ICD-10-CM

## 2021-02-23 MED ORDER — FLUTICASONE PROPIONATE 50 MCG/ACT NA SUSP: 2.0000 | Freq: Every day | NASAL | 2 refills | Status: AC

## 2021-02-23 MED ORDER — AEROCHAMBER PLUS FLO-VU LARGE MISC: 1.0000 | Freq: Once | 0 refills | Status: AC

## 2021-02-23 MED ORDER — IPRATROPIUM BROMIDE 0.06 % NA SOLN: 2.0000 | Freq: Four times a day (QID) | NASAL | 0 refills | Status: DC

## 2021-02-23 MED ORDER — CETIRIZINE HCL 10 MG PO TABS: 10.0000 mg | ORAL_TABLET | Freq: Every day | ORAL | 2 refills | Status: AC

## 2021-02-23 MED ORDER — ALBUTEROL SULFATE HFA 108 (90 BASE) MCG/ACT IN AERS: 2.0000 | INHALATION_SPRAY | Freq: Four times a day (QID) | RESPIRATORY_TRACT | 1 refills | Status: AC | PRN

## 2021-02-23 MED ORDER — METHYLPREDNISOLONE 4 MG PO TBPK: ORAL_TABLET | ORAL | 0 refills | Status: DC

## 2021-02-23 MED ORDER — PROMETHAZINE-DM 6.25-15 MG/5ML PO SYRP: 5.0000 mL | ORAL_SOLUTION | Freq: Four times a day (QID) | ORAL | 0 refills | Status: DC | PRN

## 2021-02-23 NOTE — Discharge Instructions (Addendum)
You are suffering from acute allergic rhinosinusitis and allergic lower respiratory tract disorder.  You also had a significant amount of wheezing in your lungs on physical exam today.  Please see the list below for recommended medications, dosages and frequencies to provide relief of your current symptoms:      Fluticasone (Flonase): This is a steroid nasal spray that you use once daily, 1 spray in each nare.  After 3 to 5 days of use, you will have significant improvement of the inflammation and mucus production that is being caused by exposure to allergens.  This medication can be purchased over-the-counter however I provided you with a prescription.      Promethazine DM: Promethazine is both the nasal decongestant and an antinausea medication that makes most patients feel fairly sleepy.  The DM is dextromethorphan, a cough suppressant found many over-the-counter cough medications.  Please take 5 mL before bedtime to help you sleep better, minimize your cough.  I have provided you with a prescription for this medication.      Ipratropium (Atrovent): This is an excellent nasal decongestant spray that does not cause rebound congestion, can be used up to 4 times daily as needed, instill 2 sprays into each nare with each use.  I have provided you with a prescription for this medication.   Please use this in addition to Flonase.  Cetirizine (Zyrtec): This is a second generation and histamine a very effective at minimizing allergy symptoms such as runny nose, cough, postnasal drip, ear fullness, tinnitus.    Albuterol HFA: This is a bronchodilator, it relaxes the smooth muscles that constrict your airway in your lungs when you are feeling sick or having inflammation secondary to allergies.  Please inhale 2 puffs twice daily every day using the spacer provided.  You can also inhale 2 more puffs as often as needed throughout the day for aggravating cough, chest tightness, feeling short of breath, wheezing.       Methylprednisolone (Medrol Dosepak): This is a steroid that will significantly calm your upper and lower airways, resolve your wheezing.  Please take one row of tablets daily with your breakfast meal starting tomorrow morning until the prescription is complete.      Please follow-up within the next 5 - 7 days either with your primary care provider or urgent care if your symptoms do not resolve.  If you do not have a primary care provider, we will assist you in finding one.

## 2021-02-23 NOTE — ED Triage Notes (Signed)
Pt c/o congestion that began about a month ago that was never fully resolved. Patient states feeling all the congestion in his head.

## 2021-02-23 NOTE — ED Provider Notes (Signed)
[facility]    CSN: [id number] Arrival date & time: 02/23/21  0930    HISTORY  No chief complaint on file.  HPI David [name] is a 80 y.o. male. Pt c/o congestion that began about a month ago that was never fully resolved. Patient states feeling all the congestion in his head at this point.  Patient states he has had video visits with his primary care office and has [facility], states they both gave him 10-day courses of antibiotics which were of no benefit whatsoever.  States the cough is worse at night.  Patient states that he has noticed that all of the drainage coming out of his nose has been completely clear.  Patient denies fever, aches, chills, nausea, vomiting, diarrhea.  Patient denies history significant for allergies or asthma, frequent respiratory tract infections.  The history is provided by the patient.  Past Medical History:  Diagnosis Date   Allergy    Arthritis    gen.  and left hip   BPH (benign prostatic hypertrophy)    Coronary artery disease    GERD (gastroesophageal reflux disease)    occasional   History of kidney stones    2007   Hyperlipidemia    Hypertension    Kidney tumor    LBP (low back pain) 06/2005   L5 radicular symptoms; MRI of LS spine severe spondylosis at L4-L5 with central cancal stenosis    Nocturia    Organic impotence    Prostate cancer ([place]) 10/2011   (Low grade) [facility] - Dr. Junious Silk   Pyelonephritis 03/05/2013   Renal oncocytoma of right kidney 01/23/2013   As 3.2 x 3.5 cm enhancing exophytic mass projecting off the lower pole. This is consistent with a renal cell carcinoma. No retroperitoneal lymphadenopathy.    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   Wears glasses    Patient Active Problem List   Diagnosis Date Noted   Sinusitis 01/16/2021   Atherosclerosis of aorta ([place]) 10/24/2020   Constipation 10/06/2020   COVID-19 07/09/2020   Lightheadedness 07/09/2020   S/P total knee arthroplasty, left  05/26/2020   DISH (diffuse idiopathic skeletal hyperostosis) 05/20/2020   Preop exam for internal medicine 05/20/2020   Foot contracture, right 06/06/2019   DOE (dyspnea on exertion) 05/30/2019   Edema 05/30/2019   Thyroid nodule 04/15/2019   Neck pain 04/15/2019   Carotid stenosis, asymptomatic, bilateral 01/10/2019   Vitamin D deficiency, unspecified 04/06/2018   Prediabetes 04/06/2018   Elevated alkaline phosphatase level 04/06/2018   Pain in right knee 04/27/2017   Allergic rhinitis 11/18/2015   Generalized osteoarthritis of multiple sites 08/06/2015   GERD (gastroesophageal reflux disease) 06/25/2015   Pulmonary nodule 07/05/2013   Cough 06/07/2013   Fatigue 06/07/2013   Blood loss anemia 03/18/2013   Right retroperitoneal urinoma with ureteral leak 03/05/2013   Renal oncocytoma of right kidney 01/23/2013   Nocturia 01/11/2013   Hip pain 03/05/2012   Hyperglycemia 06/08/2011   Prostate cancer ([place]) 21/30/8657   HERNIA, UMBILICAL 84/69/6295   ERECTILE DYSFUNCTION, ORGANIC 03/10/2009   Dyslipidemia 03/06/2008   Essential hypertension 03/06/2008   Past Surgical History:  Procedure Laterality Date   COLONOSCOPY  02/15/2006, 2014   Internal hemorrhoids (Dr [name]), last in 2014   [place] Bilateral 02/15/2013   Procedure: LYMPHADENECTOMY WITH INDOCYANINE GREEN DYE INJECTION;  Surgeon: [name], MD;  Location: [facility];  Service: Urology;  Laterality: Bilateral;   PROSTATE BIOPSY  N/A 12/18/2012   Procedure: BIOPSY TRANSRECTAL ULTRASONIC PROSTATE (TUBP);  Surgeon: [name], MD;  Location: [facility];  Service: Urology;  Laterality: N/A;   PROSTATE BIOPSY  12/05/11   gleason 6, 3/12 cores   REPAIR UMBILICAL AND VENTRAL HERNIA'S W/ MESH  08-21-2009   RIGHT/LEFT HEART CATH AND CORONARY ANGIOGRAPHY N/A 08/06/2019   Procedure: RIGHT/LEFT HEART CATH AND CORONARY ANGIOGRAPHY;  Surgeon: [name], Peter M,  MD;  Location: [facility];  Service: Cardiovascular;  Laterality: N/A;   ROBOT ASSISTED LAPAROSCOPIC RADICAL PROSTATECTOMY N/A 02/15/2013   Procedure: ROBOTIC ASSISTED LAPAROSCOPIC RADICAL PROSTATECTOMY;  Surgeon: [name], MD;  Location: [facility];  Service: Urology;  Laterality: N/A;   ROBOTIC ASSITED PARTIAL NEPHRECTOMY Right 02/15/2013   Procedure: ROBOTIC ASSITED PARTIAL NEPHRECTOMY;  Surgeon: [name], MD;  Location: [facility];  Service: Urology;  Laterality: Right;   SPERMATOCELECTOMY Left 12/18/2012   Procedure: LEFT SPERMATOCELECTOMY;  Surgeon: [name], MD;  Location: [facility];  Service: Urology;  Laterality: Left;   TONSILLECTOMY AND ADENOIDECTOMY  as child   TOTAL KNEE ARTHROPLASTY Left 05/26/2020   Procedure: TOTAL KNEE ARTHROPLASTY;  Surgeon: Paralee Cancel, MD;  Location: [facility];  Service: Orthopedics;  Laterality: Left;  70 mins   TRANSURETHRAL RESECTION OF PROSTATE  AGE 49    Home Medications    Prior to Admission medications   Medication Sig Start Date End Date Taking? Authorizing Provider  albuterol (VENTOLIN HFA) 108 (90 Base) MCG/ACT inhaler Inhale 2 puffs into the lungs every 6 (six) hours as needed for wheezing or shortness of breath (Cough). 02/23/21  Yes Lynden Oxford Scales, PA-C  fluticasone (FLONASE) 50 MCG/ACT nasal spray Place 2 sprays into both nostrils daily. 02/23/21  Yes Lynden Oxford Scales, PA-C  ipratropium (ATROVENT) 0.06 % nasal spray Place 2 sprays into both nostrils 4 (four) times daily. As needed for nasal congestion, runny nose 02/23/21  Yes Lynden Oxford Scales, PA-C  methylPREDNISolone (MEDROL DOSEPAK) 4 MG TBPK tablet Take 24 mg on day 1, 20 mg on day 2, 16 mg on day 3, 12 mg on day 4, 8 mg on day 5, 4 mg on day 6. 02/23/21  Yes Lynden Oxford Scales, PA-C  promethazine-dextromethorphan (PROMETHAZINE-DM) 6.25-15 MG/5ML syrup Take 5 mLs by mouth 4 (four) times daily as needed for cough. 02/23/21  Yes  Lynden Oxford Scales, PA-C  Spacer/Aero-Holding Chambers (AEROCHAMBER PLUS FLO-VU LARGE) MISC 1 each by Other route once for 1 dose. 02/23/21 02/23/21 Yes Lynden Oxford Scales, PA-C  aspirin EC 81 MG tablet Take 1 tablet (81 mg total) by mouth daily. 10/24/20 10/24/21  Plotnikov, [name], MD  atorvastatin (LIPITOR) 80 MG tablet TAKE 1 TABLET(80 MG) BY MOUTH DAILY AT 6 PM 12/03/20   Lelon Perla, MD  cetirizine (ZYRTEC) 10 MG tablet Take 1 tablet (10 mg total) by mouth daily. 02/23/21   Lynden Oxford Scales, PA-C  DULoxetine (CYMBALTA) 30 MG capsule TAKE 1 CAPSULE(30 MG) BY MOUTH DAILY 08/24/20   Plotnikov, [name], MD  ezetimibe (ZETIA) 10 MG tablet Take 1 tablet (10 mg total) by mouth daily. 11/30/20 02/28/21  Lelon Perla, MD  olmesartan (BENICAR) 40 MG tablet Take 1 tablet (40 mg total) by mouth daily. 08/13/20   Lelon Perla, MD  pantoprazole (PROTONIX) 40 MG tablet TAKE 1 TABLET BY MOUTH TWICE DAILY 11/10/20   Plotnikov, [name], MD  sodium chloride (OCEAN) 0.65 % nasal spray Place 1 spray into the nose daily  as needed for congestion.    [provider]  Vitamin D, Cholecalciferol, 50 MCG (2000 UT) CAPS Take 2,000 Units by mouth daily.     [provider]   Family History Family History  Problem Relation Age of Onset   Cancer Mother        colon   Colon cancer Mother 26   Pulmonary fibrosis Father        father owned a Medical sales representative business (father blamed inhalation of chemicals and fumes)   Pulmonary fibrosis Brother    Coronary artery disease Other    Hyperlipidemia Other    Colon polyps Neg Hx    Esophageal cancer Neg Hx    Rectal cancer Neg Hx    Stomach cancer Neg Hx    Social History Social History   Tobacco Use   Smoking status: Former    Packs/day: 0.25    Years: 10.00    Pack years: 2.50    Types: Cigarettes    Quit date: 02/28/1981    Years since quitting: 40.0   Smokeless tobacco: Never  Vaping Use   Vaping Use: Never used   Substance Use Topics   Alcohol use: Yes    Alcohol/week: 1.0 standard drink    Types: 1 Glasses of wine per week    Comment: occas.   Drug use: Not Currently    Types: Flunitrazepam   Allergies   Codeine, Ace inhibitors, Amlodipine, and Percocet [oxycodone-acetaminophen]  Review of Systems Review of Systems Pertinent findings noted in history of present illness.   Physical Exam Triage Vital Signs ED Triage Vitals  Enc Vitals Group     BP 12/25/20 0827 (!) 147/82     Pulse Rate 12/25/20 0827 72     Resp 12/25/20 0827 18     Temp 12/25/20 0827 98.3 F (36.8 C)     Temp Source 12/25/20 0827 Oral     SpO2 12/25/20 0827 98 %     Weight --      Height --      Head Circumference --      Peak Flow --      Pain Score 12/25/20 0826 5     Pain Loc --      Pain Edu? --      Excl. in [place]? --   No data found.  Updated Vital Signs BP (!) 157/88 (BP Location: Right Arm)    Pulse 79    Temp 99 F (37.2 C) (Oral)    Resp 18    SpO2 96%   Physical Exam Vitals and nursing note reviewed.  Constitutional:      General: He is not in acute distress.    Appearance: Normal appearance. He is not ill-appearing.  HENT:     Head: Normocephalic and atraumatic.     Salivary Glands: Right salivary gland is not diffusely enlarged or tender. Left salivary gland is not diffusely enlarged or tender.     Right Ear: Ear canal and external ear normal. No drainage. A middle ear effusion is present. There is no impacted cerumen. Tympanic membrane is bulging. Tympanic membrane is not injected or erythematous.     Left Ear: Ear canal and external ear normal. No drainage. A middle ear effusion is present. There is no impacted cerumen. Tympanic membrane is bulging. Tympanic membrane is not injected or erythematous.     Ears:     Comments: Bilateral EACs normal, both TMs bulging with clear fluid    Nose: Rhinorrhea  present. No nasal deformity, septal deviation, signs of injury, nasal tenderness, mucosal edema  or congestion. Rhinorrhea is clear.     Right Nostril: Occlusion present. No foreign body, epistaxis or septal hematoma.     Left Nostril: Occlusion present. No foreign body, epistaxis or septal hematoma.     Right Turbinates: Enlarged, swollen and pale.     Left Turbinates: Enlarged, swollen and pale.     Right Sinus: No maxillary sinus tenderness or frontal sinus tenderness.     Left Sinus: No maxillary sinus tenderness or frontal sinus tenderness.     Mouth/Throat:     Lips: Pink. No lesions.     Mouth: Mucous membranes are moist. No oral lesions.     Pharynx: Oropharynx is clear. Uvula midline. No posterior oropharyngeal erythema or uvula swelling.     Tonsils: No tonsillar exudate. 0 on the right. 0 on the left.     Comments: Postnasal drip Eyes:     General: Lids are normal.        Right eye: No discharge.        Left eye: No discharge.     Extraocular Movements: Extraocular movements intact.     Conjunctiva/sclera: Conjunctivae normal.     Right eye: Right conjunctiva is not injected.     Left eye: Left conjunctiva is not injected.  Neck:     Trachea: Trachea and phonation normal.  Cardiovascular:     Rate and Rhythm: Normal rate and regular rhythm.     Pulses: Normal pulses.     Heart sounds: Normal heart sounds. No murmur heard.   No friction rub. No gallop.  Pulmonary:     Effort: Pulmonary effort is normal. No accessory muscle usage, prolonged expiration or respiratory distress.     Breath sounds: No stridor, decreased air movement or transmitted upper airway sounds. Examination of the right-upper field reveals wheezing. Examination of the left-upper field reveals wheezing. Examination of the right-middle field reveals wheezing. Examination of the left-middle field reveals wheezing. Wheezing present. No decreased breath sounds, rhonchi or rales.  Chest:     Chest wall: No tenderness.  Musculoskeletal:        General: Normal range of motion.     Cervical back: Normal  range of motion and neck supple. Normal range of motion.  Lymphadenopathy:     Cervical: No cervical adenopathy.  Skin:    General: Skin is warm and dry.     Findings: No erythema or rash.  Neurological:     General: No focal deficit present.     Mental Status: He is alert and oriented to person, place, and time.  Psychiatric:        Mood and Affect: Mood normal.        Behavior: Behavior normal.    Visual Acuity Right Eye Distance:   Left Eye Distance:   Bilateral Distance:    Right Eye Near:   Left Eye Near:    Bilateral Near:     UC Couse / Diagnostics / Procedures:    EKG  Radiology No results found.  Procedures Procedures (including critical care time)  UC Diagnoses / Final Clinical Impressions(s)   I have reviewed the triage vital signs and the nursing notes.  Pertinent labs & imaging results that were available during my care of the patient were reviewed by me and considered in my medical decision making (see chart for details).   Final diagnoses:  Allergic rhinitis, unspecified seasonality, unspecified trigger  Allergic  rhinitis with postnasal drip  Acute cough  Wheezing  Allergic disorder of respiratory system, initial encounter   Patient is suffering from a significant allergic rhinosinusitis which we will treat with Zyrtec, Atrovent and Flonase.  Patient is also suffering from acute inflammation of his lower airways which we will treat with albuterol and methylprednisolone.  Patient advised to follow-up in 3 days to check progress.  Return precautions advised.  ED Prescriptions     Medication Sig Dispense Auth. Provider   cetirizine (ZYRTEC) 10 MG tablet Take 1 tablet (10 mg total) by mouth daily. 90 tablet Lynden Oxford Scales, PA-C   ipratropium (ATROVENT) 0.06 % nasal spray Place 2 sprays into both nostrils 4 (four) times daily. As needed for nasal congestion, runny nose 15 mL Lynden Oxford Scales, PA-C   methylPREDNISolone (MEDROL DOSEPAK) 4 MG  TBPK tablet Take 24 mg on day 1, 20 mg on day 2, 16 mg on day 3, 12 mg on day 4, 8 mg on day 5, 4 mg on day 6. 21 tablet Lynden Oxford Scales, PA-C   fluticasone (FLONASE) 50 MCG/ACT nasal spray Place 2 sprays into both nostrils daily. 18 mL Lynden Oxford Scales, PA-C   albuterol (VENTOLIN HFA) 108 (90 Base) MCG/ACT inhaler Inhale 2 puffs into the lungs every 6 (six) hours as needed for wheezing or shortness of breath (Cough). 18 g Lynden Oxford Scales, PA-C   Spacer/Aero-Holding Chambers (AEROCHAMBER PLUS FLO-VU LARGE) MISC 1 each by Other route once for 1 dose. 1 each Lynden Oxford Scales, PA-C   promethazine-dextromethorphan (PROMETHAZINE-DM) 6.25-15 MG/5ML syrup Take 5 mLs by mouth 4 (four) times daily as needed for cough. 180 mL Lynden Oxford Scales, PA-C      PDMP not reviewed this encounter.  Pending results:  Labs Reviewed - No data to display  Medications Ordered in UC: Medications - No data to display  Disposition Upon Discharge:  Condition: stable for discharge home Home: take medications as prescribed; routine discharge instructions as discussed; follow up as advised.  Patient presented with an acute illness with associated systemic symptoms and significant discomfort requiring urgent management. In my opinion, this is a condition that a prudent lay person (someone who possesses an average knowledge of health and medicine) may potentially expect to result in complications if not addressed urgently such as respiratory distress, impairment of bodily function or dysfunction of bodily organs.   Routine symptom specific, illness specific and/or disease specific instructions were discussed with the patient and/or caregiver at length.   As such, the patient has been evaluated and assessed, work-up was performed and treatment was provided in alignment with urgent care protocols and evidence based medicine.  Patient/parent/caregiver has been advised that the patient may require  follow up for further testing and treatment if the symptoms continue in spite of treatment, as clinically indicated and appropriate.  If the patient was tested for COVID-19, Influenza and/or RSV, then the patient/parent/guardian was advised to isolate at home pending the results of his/her diagnostic coronavirus test and potentially longer if theyre positive. I have also advised pt that if his/her COVID-19 test returns positive, it's recommended to self-isolate for at least 10 days after symptoms first appeared AND until fever-free for 24 hours without fever reducer AND other symptoms have improved or resolved. Discussed self-isolation recommendations as well as instructions for household member/close contacts as per the [facility] and [place] [facility], and also gave patient the [place] packet with this information.  Patient/parent/caregiver has been advised to return to the [facility]  or PCP in 3-5 days if no better; to PCP or the Emergency Department if new signs and symptoms develop, or if the current signs or symptoms continue to change or worsen for further workup, evaluation and treatment as clinically indicated and appropriate  The patient will follow up with their current PCP if and as advised. If the patient does not currently have a PCP we will assist them in obtaining one.   The patient may need specialty follow up if the symptoms continue, in spite of conservative treatment and management, for further workup, evaluation, consultation and treatment as clinically indicated and appropriate.  Patient/parent/caregiver verbalized understanding and agreement of plan as discussed.  All questions were addressed during visit.  Please see discharge instructions below for further details of plan.  Discharge Instructions:   Discharge Instructions      You are suffering from acute allergic rhinosinusitis and allergic lower respiratory tract disorder.  You also had a significant amount of wheezing in your lungs on physical  exam today.  Please see the list below for recommended medications, dosages and frequencies to provide relief of your current symptoms:      Fluticasone (Flonase): This is a steroid nasal spray that you use once daily, 1 spray in each nare.  After 3 to 5 days of use, you will have significant improvement of the inflammation and mucus production that is being caused by exposure to allergens.  This medication can be purchased over-the-counter however I provided you with a prescription.      Promethazine DM: Promethazine is both the nasal decongestant and an antinausea medication that makes most patients feel fairly sleepy.  The DM is dextromethorphan, a cough suppressant found many over-the-counter cough medications.  Please take 5 mL before bedtime to help you sleep better, minimize your cough.  I have provided you with a prescription for this medication.      Ipratropium (Atrovent): This is an excellent nasal decongestant spray that does not cause rebound congestion, can be used up to 4 times daily as needed, instill 2 sprays into each nare with each use.  I have provided you with a prescription for this medication.   Please use this in addition to Flonase.  Cetirizine (Zyrtec): This is a second generation and histamine a very effective at minimizing allergy symptoms such as runny nose, cough, postnasal drip, ear fullness, tinnitus.    Albuterol HFA: This is a bronchodilator, it relaxes the smooth muscles that constrict your airway in your lungs when you are feeling sick or having inflammation secondary to allergies.  Please inhale 2 puffs twice daily every day using the spacer provided.  You can also inhale 2 more puffs as often as needed throughout the day for aggravating cough, chest tightness, feeling short of breath, wheezing.      Methylprednisolone (Medrol Dosepak): This is a steroid that will significantly calm your upper and lower airways, resolve your wheezing.  Please take one row of  tablets daily with your breakfast meal starting tomorrow morning until the prescription is complete.      Please follow-up within the next 5 - 7 days either with your primary care provider or urgent care if your symptoms do not resolve.  If you do not have a primary care provider, we will assist you in finding one.      This office note has been dictated using Museum/gallery curator.  Unfortunately, and despite my best efforts, this method of dictation can sometimes lead to occasional  typographical or grammatical errors.  I apologize in advance if this occurs.          Lynden Oxford Scales, PA-C 02/23/21 1400

## 2021-02-25 ENCOUNTER — Ambulatory Visit: Payer: Medicare Other | Admitting: Internal Medicine

## 2021-02-25 ENCOUNTER — Encounter: Payer: Self-pay | Admitting: Internal Medicine

## 2021-02-25 ENCOUNTER — Ambulatory Visit (INDEPENDENT_AMBULATORY_CARE_PROVIDER_SITE_OTHER): Payer: Medicare Other

## 2021-02-25 ENCOUNTER — Other Ambulatory Visit: Payer: Self-pay

## 2021-02-25 DIAGNOSIS — R052 Subacute cough: Secondary | ICD-10-CM

## 2021-02-25 DIAGNOSIS — R0609 Other forms of dyspnea: Secondary | ICD-10-CM

## 2021-02-25 DIAGNOSIS — I10 Essential (primary) hypertension: Secondary | ICD-10-CM

## 2021-02-25 MED ORDER — HYDROCOD POLST-CPM POLST ER 10-8 MG/5ML PO SUER: ORAL | 0 refills | Status: DC

## 2021-02-25 MED ORDER — BENZONATATE 200 MG PO CAPS: 200.0000 mg | ORAL_CAPSULE | Freq: Three times a day (TID) | ORAL | 1 refills | Status: DC | PRN

## 2021-02-25 NOTE — Progress Notes (Signed)
Subjective:  Patient ID: David [name], male    DOB: 1941/02/07  Age: 80 y.o. MRN: [id number]  CC: No chief complaint on file.   HPI David [name] presents for sinusitis/URI type symptoms for >30 days. He had multiple visits to the office, UC, ER. C/o cough - persistent, gets 10 min coughing spells triggered by movement, coughing, talking, spastic. Treated w/steroids, abx, steroids. No SOB, wheezing.Marland KitchenMarland KitchenOn steroids now Family h/o PF.... Chest CT angio 2021 Follow-up on hypertension  Outpatient Medications Prior to Visit  Medication Sig Dispense Refill   albuterol (VENTOLIN HFA) 108 (90 Base) MCG/ACT inhaler Inhale 2 puffs into the lungs every 6 (six) hours as needed for wheezing or shortness of breath (Cough). 18 g 1   aspirin EC 81 MG tablet Take 1 tablet (81 mg total) by mouth daily. 100 tablet 3   atorvastatin (LIPITOR) 80 MG tablet TAKE 1 TABLET(80 MG) BY MOUTH DAILY AT 6 PM 90 tablet 3   cetirizine (ZYRTEC) 10 MG tablet Take 1 tablet (10 mg total) by mouth daily. 90 tablet 2   ezetimibe (ZETIA) 10 MG tablet Take 1 tablet (10 mg total) by mouth daily. 90 tablet 3   fluticasone (FLONASE) 50 MCG/ACT nasal spray Place 2 sprays into both nostrils daily. 18 mL 2   ipratropium (ATROVENT) 0.06 % nasal spray Place 2 sprays into both nostrils 4 (four) times daily. As needed for nasal congestion, runny nose 15 mL 0   Multiple Vitamin (MULTIVITAMIN) capsule Take 1 capsule by mouth daily.     olmesartan (BENICAR) 40 MG tablet Take 1 tablet (40 mg total) by mouth daily. 90 tablet 3   pantoprazole (PROTONIX) 40 MG tablet TAKE 1 TABLET BY MOUTH TWICE DAILY 60 tablet 11   sodium chloride (OCEAN) 0.65 % nasal spray Place 1 spray into the nose daily as needed for congestion.     Vitamin D, Cholecalciferol, 50 MCG (2000 UT) CAPS Take 2,000 Units by mouth daily.      DULoxetine (CYMBALTA) 30 MG capsule TAKE 1 CAPSULE(30 MG) BY MOUTH DAILY 90 capsule 1   methylPREDNISolone (MEDROL DOSEPAK) 4 MG TBPK  tablet Take 24 mg on day 1, 20 mg on day 2, 16 mg on day 3, 12 mg on day 4, 8 mg on day 5, 4 mg on day 6. 21 tablet 0   promethazine-dextromethorphan (PROMETHAZINE-DM) 6.25-15 MG/5ML syrup Take 5 mLs by mouth 4 (four) times daily as needed for cough. 180 mL 0   aspirin 81 MG EC tablet Take 1 tablet by mouth daily.     No facility-administered medications prior to visit.    ROS: Review of Systems  Constitutional:  Positive for fatigue. Negative for appetite change and unexpected weight change.  HENT:  Negative for congestion, nosebleeds, sneezing, sore throat and trouble swallowing.   Eyes:  Negative for itching and visual disturbance.  Respiratory:  Positive for cough. Negative for shortness of breath and wheezing.   Cardiovascular:  Negative for chest pain, palpitations and leg swelling.  Gastrointestinal:  Negative for abdominal distention, blood in stool, diarrhea and nausea.  Genitourinary:  Negative for frequency and hematuria.  Musculoskeletal:  Negative for back pain, gait problem, joint swelling and neck pain.  Skin:  Negative for rash.  Neurological:  Negative for dizziness, tremors, speech difficulty and weakness.  Psychiatric/Behavioral:  Negative for agitation, dysphoric mood and sleep disturbance. The patient is not nervous/anxious.    Objective:  BP 130/80 (BP Location: Left Arm, Patient Position: Sitting, Cuff  Size: Large)    Pulse 86    Temp 98.7 F (37.1 C) (Oral)    Ht 6' (1.829 m)    Wt 210 lb (95.3 kg)    SpO2 97%    BMI 28.48 kg/m   BP Readings from Last 3 Encounters:  02/26/21 119/61  02/25/21 130/80  02/23/21 (!) 157/88    Wt Readings from Last 3 Encounters:  02/25/21 210 lb (95.3 kg)  01/14/21 205 lb (93 kg)  11/17/20 208 lb 6.4 oz (94.5 kg)    Physical Exam Constitutional:      General: He is not in acute distress.    Appearance: He is well-developed.     Comments: NAD  Eyes:     Conjunctiva/sclera: Conjunctivae normal.     Pupils: Pupils are  equal, round, and reactive to light.  Neck:     Thyroid: No thyromegaly.     Vascular: No JVD.  Cardiovascular:     Rate and Rhythm: Normal rate and regular rhythm.     Heart sounds: Normal heart sounds. No murmur heard.   No friction rub. No gallop.  Pulmonary:     Effort: Pulmonary effort is normal. No respiratory distress.     Breath sounds: Normal breath sounds. No wheezing or rales.  Chest:     Chest wall: No tenderness.  Abdominal:     General: Bowel sounds are normal. There is no distension.     Palpations: Abdomen is soft. There is no mass.     Tenderness: There is no abdominal tenderness. There is no guarding or rebound.  Musculoskeletal:        General: No tenderness. Normal range of motion.     Cervical back: Normal range of motion.  Lymphadenopathy:     Cervical: No cervical adenopathy.  Skin:    General: Skin is warm and dry.     Findings: No rash.  Neurological:     Mental Status: He is alert and oriented to person, place, and time.     Cranial Nerves: No cranial nerve deficit.     Motor: No abnormal muscle tone.     Coordination: Coordination normal.     Gait: Gait normal.     Deep Tendon Reflexes: Reflexes are normal and symmetric.  Psychiatric:        Behavior: Behavior normal.        Thought Content: Thought content normal.        Judgment: Judgment normal.  Mild crackles at bases Coughing a lot  Lab Results  Component Value Date   WBC 12.5 (H) 02/26/2021   HGB 12.9 (L) 02/26/2021   HCT 41.2 02/26/2021   PLT 194 02/26/2021   GLUCOSE 96 02/26/2021   CHOL 172 11/18/2020   TRIG 99 11/18/2020   HDL 48 11/18/2020   LDLDIRECT 122.1 03/05/2012   LDLCALC 106 (H) 11/18/2020   ALT 38 02/26/2021   AST 42 (H) 02/26/2021   NA 137 02/26/2021   K 4.2 02/26/2021   CL 106 02/26/2021   CREATININE 1.29 (H) 02/26/2021   BUN 28 (H) 02/26/2021   CO2 22 02/26/2021   TSH 1.42 05/30/2019   PSA 0.047 04/08/2017   INR 1.1 02/26/2021   HGBA1C 6.1 05/19/2020     No results found.  Assessment & Plan:   Problem List Items Addressed This Visit     Cough    Refractory post-URI Treat GERD Start Tussionex bid w/slow taper. Tessalon prn Tessalon tid prn Obtain CXR  Relevant Orders   DG Chest 2 View (Completed)   DOE (dyspnea on exertion)    Chronic Not worse Get a CXR      Essential hypertension    Controlled.  Continue current therapy with olmesartan         Meds ordered this encounter  Medications   DISCONTD: chlorpheniramine-HYDROcodone (TUSSIONEX PENNKINETIC ER) 10-8 MG/5ML SUER    Sig: Take 5 ml po bid x 1 week, then 2.5 ml bid x 1 week, then taper off and d/c    Dispense:  230 mL    Refill:  0   benzonatate (TESSALON) 200 MG capsule    Sig: Take 1 capsule (200 mg total) by mouth 3 (three) times daily as needed for cough.    Dispense:  60 capsule    Refill:  1   chlorpheniramine-HYDROcodone (TUSSIONEX PENNKINETIC ER) 10-8 MG/5ML SUER    Sig: Take 5 ml po bid x 1 week, then 2.5 ml bid x 1 week, then taper off and d/c    Dispense:  230 mL    Refill:  0      Follow-up: Return in about 2 weeks (around 03/11/2021) for a follow-up visit.  Walker Kehr, MD

## 2021-02-25 NOTE — Assessment & Plan Note (Signed)
Chronic Not worse Get a CXR

## 2021-02-25 NOTE — Assessment & Plan Note (Addendum)
Refractory post-URI Treat GERD Start Tussionex bid w/slow taper. Tessalon prn Tessalon tid prn Obtain CXR

## 2021-02-26 ENCOUNTER — Other Ambulatory Visit: Payer: Self-pay

## 2021-02-26 ENCOUNTER — Encounter (HOSPITAL_BASED_OUTPATIENT_CLINIC_OR_DEPARTMENT_OTHER): Payer: Self-pay | Admitting: Obstetrics and Gynecology

## 2021-02-26 ENCOUNTER — Emergency Department (HOSPITAL_BASED_OUTPATIENT_CLINIC_OR_DEPARTMENT_OTHER)
Admission: EM | Admit: 2021-02-26 | Discharge: 2021-02-26 | Disposition: A | Discharge status: Home / Self Care | Payer: Medicare Other | Attending: Emergency Medicine | Admitting: Emergency Medicine

## 2021-02-26 ENCOUNTER — Other Ambulatory Visit (HOSPITAL_BASED_OUTPATIENT_CLINIC_OR_DEPARTMENT_OTHER): Payer: Self-pay

## 2021-02-26 ENCOUNTER — Emergency Department (HOSPITAL_BASED_OUTPATIENT_CLINIC_OR_DEPARTMENT_OTHER): Payer: Medicare Other | Admitting: Radiology

## 2021-02-26 DIAGNOSIS — R0682 Tachypnea, not elsewhere classified: Secondary | ICD-10-CM | POA: Diagnosis not present

## 2021-02-26 DIAGNOSIS — R509 Fever, unspecified: Secondary | ICD-10-CM | POA: Diagnosis present

## 2021-02-26 DIAGNOSIS — Z87891 Personal history of nicotine dependence: Secondary | ICD-10-CM | POA: Diagnosis not present

## 2021-02-26 DIAGNOSIS — Z20822 Contact with and (suspected) exposure to covid-19: Secondary | ICD-10-CM | POA: Diagnosis not present

## 2021-02-26 DIAGNOSIS — J101 Influenza due to other identified influenza virus with other respiratory manifestations: Secondary | ICD-10-CM

## 2021-02-26 DIAGNOSIS — Z8616 Personal history of COVID-19: Secondary | ICD-10-CM | POA: Diagnosis not present

## 2021-02-26 DIAGNOSIS — Z79899 Other long term (current) drug therapy: Secondary | ICD-10-CM | POA: Insufficient documentation

## 2021-02-26 DIAGNOSIS — Z7982 Long term (current) use of aspirin: Secondary | ICD-10-CM | POA: Insufficient documentation

## 2021-02-26 DIAGNOSIS — Z96652 Presence of left artificial knee joint: Secondary | ICD-10-CM | POA: Insufficient documentation

## 2021-02-26 DIAGNOSIS — I10 Essential (primary) hypertension: Secondary | ICD-10-CM | POA: Insufficient documentation

## 2021-02-26 DIAGNOSIS — I2510 Atherosclerotic heart disease of native coronary artery without angina pectoris: Secondary | ICD-10-CM | POA: Diagnosis not present

## 2021-02-26 DIAGNOSIS — Z8546 Personal history of malignant neoplasm of prostate: Secondary | ICD-10-CM | POA: Diagnosis not present

## 2021-02-26 LAB — CBC WITH DIFFERENTIAL/PLATELET
Abs Immature Granulocytes: 0.09 10*3/uL — ABNORMAL HIGH (ref 0.00–0.07)
Basophils Absolute: 0 10*3/uL (ref 0.0–0.1)
Basophils Relative: 0 %
Eosinophils Absolute: 0 10*3/uL (ref 0.0–0.5)
Eosinophils Relative: 0 %
HCT: 41.2 % (ref 39.0–52.0)
Hemoglobin: 12.9 g/dL — ABNORMAL LOW (ref 13.0–17.0)
Immature Granulocytes: 1 %
Lymphocytes Relative: 16 %
Lymphs Abs: 2 10*3/uL (ref 0.7–4.0)
MCH: 28.7 pg (ref 26.0–34.0)
MCHC: 31.3 g/dL (ref 30.0–36.0)
MCV: 91.8 fL (ref 80.0–100.0)
Monocytes Absolute: 1.6 10*3/uL — ABNORMAL HIGH (ref 0.1–1.0)
Monocytes Relative: 13 %
Neutro Abs: 8.8 10*3/uL — ABNORMAL HIGH (ref 1.7–7.7)
Neutrophils Relative %: 70 %
Platelets: 194 10*3/uL (ref 150–400)
RBC: 4.49 MIL/uL (ref 4.22–5.81)
RDW: 15.3 % (ref 11.5–15.5)
WBC: 12.5 10*3/uL — ABNORMAL HIGH (ref 4.0–10.5)
nRBC: 0 % (ref 0.0–0.2)

## 2021-02-26 LAB — COMPREHENSIVE METABOLIC PANEL
ALT: 38 U/L (ref 0–44)
AST: 42 U/L — ABNORMAL HIGH (ref 15–41)
Albumin: 4 g/dL (ref 3.5–5.0)
Alkaline Phosphatase: 102 U/L (ref 38–126)
Anion gap: 9 (ref 5–15)
BUN: 28 mg/dL — ABNORMAL HIGH (ref 8–23)
CO2: 22 mmol/L (ref 22–32)
Calcium: 10.5 mg/dL — ABNORMAL HIGH (ref 8.9–10.3)
Chloride: 106 mmol/L (ref 98–111)
Creatinine, Ser: 1.29 mg/dL — ABNORMAL HIGH (ref 0.61–1.24)
GFR, Estimated: 56 mL/min — ABNORMAL LOW (ref >60)
Glucose, Bld: 96 mg/dL (ref 70–99)
Potassium: 4.2 mmol/L (ref 3.5–5.1)
Sodium: 137 mmol/L (ref 135–145)
Total Bilirubin: 0.7 mg/dL (ref 0.3–1.2)
Total Protein: 7.3 g/dL (ref 6.5–8.1)

## 2021-02-26 LAB — RESP PANEL BY RT-PCR (FLU A&B, COVID) ARPGX2
Influenza A by PCR: POSITIVE — AB
Influenza B by PCR: NEGATIVE
SARS Coronavirus 2 by RT PCR: NEGATIVE

## 2021-02-26 LAB — PROTIME-INR
INR: 1.1 (ref 0.8–1.2)
Prothrombin Time: 14.1 seconds (ref 11.4–15.2)

## 2021-02-26 LAB — LACTIC ACID, PLASMA: Lactic Acid, Venous: 1.4 mmol/L (ref 0.5–1.9)

## 2021-02-26 MED ORDER — ACETAMINOPHEN 325 MG PO TABS
650.0000 mg | ORAL_TABLET | Freq: Once | ORAL | Status: AC | PRN
  Administered 2021-02-26: 14:00:00 650 mg via ORAL
  Filled 2021-02-26: qty 2

## 2021-02-26 MED ORDER — OSELTAMIVIR PHOSPHATE 75 MG PO CAPS
75.0000 mg | ORAL_CAPSULE | Freq: Two times a day (BID) | ORAL | 0 refills | Status: DC
  Filled 2021-02-26: qty 10, 5d supply, fill #0

## 2021-02-26 NOTE — ED Provider Notes (Signed)
[facility] EMERGENC[facility] DEPT Provider Note   CSN: [id number] Arrival date & time: 02/26/21  1213     History No chief complaint on file.   David [name] is a 80 y.o. male.  Patient followed by pulmonary medicine and for chronic respiratory problem.  Without specific diagnosis.  Patient started with fevers yesterday.  Right ear with a temp of 103.  Oxygen sats on room air are good anywhere from 90 to 96%.  Respiratory rate in the low 20s.  Patient able to talk in complete sentences.  Patient already using albuterol.  Patient already using codeine cough medicine.  Patient also with generalized weakness at home.  No nausea vomiting or diarrhea.  Patient states he is taking fluids and food fairly well.  Tongue is moist      Past Medical History:  Diagnosis Date   Allergy    Arthritis    gen.  and left hip   BPH (benign prostatic hypertrophy)    Coronary artery disease    GERD (gastroesophageal reflux disease)    occasional   History of kidney stones    2007   Hyperlipidemia    Hypertension    Kidney tumor    LBP (low back pain) 06/2005   L5 radicular symptoms; MRI of LS spine severe spondylosis at L4-L5 with central cancal stenosis    Nocturia    Organic impotence    Prostate cancer ([place]) 10/2011   (Low grade) [facility] - Dr. Junious Silk   Pyelonephritis 03/05/2013   Renal oncocytoma of right kidney 01/23/2013   As 3.2 x 3.5 cm enhancing exophytic mass projecting off the lower pole. This is consistent with a renal cell carcinoma. No retroperitoneal lymphadenopathy.    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   Wears glasses     Patient Active Problem List   Diagnosis Date Noted   Sinusitis 01/16/2021   Atherosclerosis of aorta ([place]) 10/24/2020   Constipation 10/06/2020   COVID-19 07/09/2020   Lightheadedness 07/09/2020   S/P total knee arthroplasty, left 05/26/2020   DISH (diffuse idiopathic skeletal hyperostosis) 05/20/2020   Preop exam for internal  medicine 05/20/2020   Foot contracture, right 06/06/2019   DOE (dyspnea on exertion) 05/30/2019   Edema 05/30/2019   Thyroid nodule 04/15/2019   Neck pain 04/15/2019   Carotid stenosis, asymptomatic, bilateral 01/10/2019   Vitamin D deficiency, unspecified 04/06/2018   Prediabetes 04/06/2018   Elevated alkaline phosphatase level 04/06/2018   Pain in right knee 04/27/2017   Allergic rhinitis 11/18/2015   Generalized osteoarthritis of multiple sites 08/06/2015   GERD (gastroesophageal reflux disease) 06/25/2015   Pulmonary nodule 07/05/2013   Cough 06/07/2013   Fatigue 06/07/2013   Blood loss anemia 03/18/2013   Right retroperitoneal urinoma with ureteral leak 03/05/2013   Renal oncocytoma of right kidney 01/23/2013   Nocturia 01/11/2013   Hip pain 03/05/2012   Hyperglycemia 06/08/2011   Prostate cancer ([place]) 02/54/2706   HERNIA, UMBILICAL 23/76/2831   ERECTILE DYSFUNCTION, ORGANIC 03/10/2009   Dyslipidemia 03/06/2008   Essential hypertension 03/06/2008    Past Surgical History:  Procedure Laterality Date   COLONOSCOPY  02/15/2006, 2014   Internal hemorrhoids (Dr [name]), last in 2014   [place] Bilateral 02/15/2013   Procedure: LYMPHADENECTOMY WITH INDOCYANINE GREEN DYE INJECTION;  Surgeon: [name], MD;  Location: [facility];  Service: Urology;  Laterality: Bilateral;   PROSTATE BIOPSY N/A 12/18/2012   Procedure: BIOPSY TRANSRECTAL ULTRASONIC PROSTATE (TUBP);  Surgeon: Rodman Key  Marella Bile, MD;  Location: [facility];  Service: Urology;  Laterality: N/A;   PROSTATE BIOPSY  12/05/11   gleason 6, 3/12 cores   REPAIR UMBILICAL AND VENTRAL HERNIA'S W/ MESH  08-21-2009   RIGHT/LEFT HEART CATH AND CORONARY ANGIOGRAPHY N/A 08/06/2019   Procedure: RIGHT/LEFT HEART CATH AND CORONARY ANGIOGRAPHY;  Surgeon: [name], Peter M, MD;  Location: [facility];  Service: Cardiovascular;  Laterality: N/A;   ROBOT ASSISTED  LAPAROSCOPIC RADICAL PROSTATECTOMY N/A 02/15/2013   Procedure: ROBOTIC ASSISTED LAPAROSCOPIC RADICAL PROSTATECTOMY;  Surgeon: [name], MD;  Location: [facility];  Service: Urology;  Laterality: N/A;   ROBOTIC ASSITED PARTIAL NEPHRECTOMY Right 02/15/2013   Procedure: ROBOTIC ASSITED PARTIAL NEPHRECTOMY;  Surgeon: [name], MD;  Location: [facility];  Service: Urology;  Laterality: Right;   SPERMATOCELECTOMY Left 12/18/2012   Procedure: LEFT SPERMATOCELECTOMY;  Surgeon: [name], MD;  Location: [facility];  Service: Urology;  Laterality: Left;   TONSILLECTOMY AND ADENOIDECTOMY  as child   TOTAL KNEE ARTHROPLASTY Left 05/26/2020   Procedure: TOTAL KNEE ARTHROPLASTY;  Surgeon: Paralee Cancel, MD;  Location: [facility];  Service: Orthopedics;  Laterality: Left;  70 mins   TRANSURETHRAL RESECTION OF PROSTATE  AGE 89       Family History  Problem Relation Age of Onset   Cancer Mother        colon   Colon cancer Mother 16   Pulmonary fibrosis Father        father owned a Medical sales representative business (father blamed inhalation of chemicals and fumes)   Pulmonary fibrosis Brother    Coronary artery disease Other    Hyperlipidemia Other    Colon polyps Neg Hx    Esophageal cancer Neg Hx    Rectal cancer Neg Hx    Stomach cancer Neg Hx     Social History   Tobacco Use   Smoking status: Former    Packs/day: 0.25    Years: 10.00    Pack years: 2.50    Types: Cigarettes    Quit date: 02/28/1981    Years since quitting: 40.0   Smokeless tobacco: Never  Vaping Use   Vaping Use: Never used  Substance Use Topics   Alcohol use: Yes    Alcohol/week: 1.0 standard drink    Types: 1 Glasses of wine per week    Comment: occas.   Drug use: Not Currently    Types: Flunitrazepam    Home Medications Prior to Admission medications   Medication Sig Start Date End Date Taking? Authorizing Provider  oseltamivir (TAMIFLU) 75 MG capsule Take 1 capsule (75 mg total) by mouth every  12 (twelve) hours. 02/26/21  Yes Fredia Sorrow, MD  albuterol (VENTOLIN HFA) 108 (90 Base) MCG/ACT inhaler Inhale 2 puffs into the lungs every 6 (six) hours as needed for wheezing or shortness of breath (Cough). 02/23/21   Lynden Oxford Scales, PA-C  aspirin EC 81 MG tablet Take 1 tablet (81 mg total) by mouth daily. 10/24/20 10/24/21  Plotnikov, [name], MD  atorvastatin (LIPITOR) 80 MG tablet TAKE 1 TABLET(80 MG) BY MOUTH DAILY AT 6 PM 12/03/20   Crenshaw, Denice Bors, MD  benzonatate (TESSALON) 200 MG capsule Take 1 capsule (200 mg total) by mouth 3 (three) times daily as needed for cough. 02/25/21   Plotnikov, [name], MD  cetirizine (ZYRTEC) 10 MG tablet Take 1 tablet (10 mg total) by mouth daily. 02/23/21   Lynden Oxford Scales, PA-C  chlorpheniramine-HYDROcodone [facility]  ER) 10-8 MG/5ML SUER Take 5 ml po bid x 1 week, then 2.5 ml bid x 1 week, then taper off and d/c 02/25/21   Plotnikov, [name], MD  ezetimibe (ZETIA) 10 MG tablet Take 1 tablet (10 mg total) by mouth daily. 11/30/20 02/28/21  Lelon Perla, MD  fluticasone (FLONASE) 50 MCG/ACT nasal spray Place 2 sprays into both nostrils daily. 02/23/21   Lynden Oxford Scales, PA-C  ipratropium (ATROVENT) 0.06 % nasal spray Place 2 sprays into both nostrils 4 (four) times daily. As needed for nasal congestion, runny nose 02/23/21   Lynden Oxford Scales, PA-C  Multiple Vitamin (MULTIVITAMIN) capsule Take 1 capsule by mouth daily.    [provider]  olmesartan (BENICAR) 40 MG tablet Take 1 tablet (40 mg total) by mouth daily. 08/13/20   Lelon Perla, MD  pantoprazole (PROTONIX) 40 MG tablet TAKE 1 TABLET BY MOUTH TWICE DAILY 11/10/20   Plotnikov, [name], MD  sodium chloride (OCEAN) 0.65 % nasal spray Place 1 spray into the nose daily as needed for congestion.    [provider]  Vitamin D, Cholecalciferol, 50 MCG (2000 UT) CAPS Take 2,000 Units by mouth daily.     [provider]     Allergies    Codeine, Ace inhibitors, Amlodipine, and Percocet [oxycodone-acetaminophen]  Review of Systems   Review of Systems  Constitutional:  Positive for fatigue and fever. Negative for chills.  HENT:  Negative for ear pain and sore throat.   Eyes:  Negative for pain and visual disturbance.  Respiratory:  Positive for cough and shortness of breath.   Cardiovascular:  Negative for chest pain and palpitations.  Gastrointestinal:  Negative for abdominal pain and vomiting.  Genitourinary:  Negative for dysuria and hematuria.  Musculoskeletal:  Negative for arthralgias and back pain.  Skin:  Negative for color change and rash.  Neurological:  Negative for seizures and syncope.  All other systems reviewed and are negative.  Physical Exam Updated Vital Signs BP 120/68    Pulse 93    Temp (!) 101.3 F (38.5 C) (Oral)    Resp (!) 26    SpO2 93%   Physical Exam Vitals and nursing note reviewed.  Constitutional:      General: He is not in acute distress.    Appearance: Normal appearance. He is well-developed.  HENT:     Head: Normocephalic and atraumatic.  Eyes:     Extraocular Movements: Extraocular movements intact.     Conjunctiva/sclera: Conjunctivae normal.     Pupils: Pupils are equal, round, and reactive to light.  Cardiovascular:     Rate and Rhythm: Normal rate and regular rhythm.     Heart sounds: No murmur heard. Pulmonary:     Effort: Pulmonary effort is normal. No respiratory distress.     Breath sounds: Normal breath sounds. No wheezing, rhonchi or rales.  Abdominal:     Palpations: Abdomen is soft.     Tenderness: There is no abdominal tenderness.  Musculoskeletal:        General: No swelling.     Cervical back: Normal range of motion and neck supple.  Skin:    General: Skin is warm and dry.     Capillary Refill: Capillary refill takes less than 2 seconds.     Findings: No rash.  Neurological:     General: No focal deficit present.     Mental Status:  He is alert and oriented to person, place, and time.  Cranial Nerves: No cranial nerve deficit.     Sensory: No sensory deficit.     Motor: No weakness.  Psychiatric:        Mood and Affect: Mood normal.    ED Results / Procedures / Treatments   Labs (all labs ordered are listed, but only abnormal results are displayed) Labs Reviewed  RESP PANEL BY RT-PCR (FLU A&B, COVID) ARPGX2 - Abnormal; Notable for the following components:      Result Value   Influenza A by PCR POSITIVE (*)    All other components within normal limits  COMPREHENSIVE METABOLIC PANEL - Abnormal; Notable for the following components:   BUN 28 (*)    Creatinine, Ser 1.29 (*)    Calcium 10.5 (*)    AST 42 (*)    GFR, Estimated 56 (*)    All other components within normal limits  CBC WITH DIFFERENTIAL/PLATELET - Abnormal; Notable for the following components:   WBC 12.5 (*)    Hemoglobin 12.9 (*)    Neutro Abs 8.8 (*)    Monocytes Absolute 1.6 (*)    Abs Immature Granulocytes 0.09 (*)    All other components within normal limits  CULTURE, BLOOD (ROUTINE X 2)  CULTURE, BLOOD (ROUTINE X 2)  LACTIC ACID, PLASMA  PROTIME-INR  LACTIC ACID, PLASMA  URINALYSIS, ROUTINE W REFLEX MICROSCOPIC    EKG EKG Interpretation  Date/Time:  Friday February 26 2021 13:10:28 EST Ventricular Rate:  91 PR Interval:  162 QRS Duration: 141 QT Interval:  369 QTC Calculation: 454 R Axis:   129 Text Interpretation: Sinus rhythm Ventricular bigeminy Nonspecific intraventricular conduction delay No significant change since last tracing Confirmed by Fredia Sorrow [phone]) on 02/26/2021 3:13:03 PM  Radiology DG Chest 2 View  Result Date: 02/26/2021 CLINICAL DATA:  Suspected sepsis. Congestion started 1 month ago. Cough. EXAM: CHEST - 2 VIEW COMPARISON:  02/25/2021 FINDINGS: Low lung volumes. Heart size is accentuated by technique and low lung volumes. Minimal bibasilar atelectasis. No consolidations. No evidence for  pulmonary edema. IMPRESSION: Low lung volumes.  No evidence for acute  abnormality. Electronically Signed   By: Nolon Nations M.D.   On: 02/26/2021 13:52   DG Chest 2 View  Result Date: 02/26/2021 CLINICAL DATA:  Cough. EXAM: CHEST - 2 VIEW COMPARISON:  May 19, 2020. FINDINGS: The heart size and mediastinal contours are within normal limits. Both lungs are clear. Stable hiatal hernia. The visualized skeletal structures are unremarkable. IMPRESSION: No active cardiopulmonary disease.  Stable hiatal hernia. Electronically Signed   By: Marijo Conception M.D.   On: 02/26/2021 11:48    Procedures Procedures   Medications Ordered in ED Medications  acetaminophen (TYLENOL) tablet 650 mg (650 mg Oral Given 02/26/21 1401)    ED Course  I have reviewed the triage vital signs and the nursing notes.  Pertinent labs & imaging results that were available during my care of the patient were reviewed by me and considered in my medical decision making (see chart for details).    MDM Rules/Calculators/A&P                           Flu test positive.  Patient probably started with influenza A on top of his chronic respiratory problem is followed by pulmonary medicine.  COVID testing is negative.  Lecture lites without significant abnormalities.  GFR down a little bit at 56.  Potassium normal at 4.2.  Lactic acid normal.  White  blood cell count up some at 12,000 hemoglobin good at 12.9.  Chest x-ray is negative for any acute abnormality.  No evidence of pneumonia.  We will treat patient with Tamiflu since symptoms just started yesterday.  Otherwise symptomatic treatment.  Patient's family will be able to stay close and check on him.  He will need to return for any new or worse symptoms.  We will follow-up with his pulmonary doctors next week.   Final Clinical Impression(s) / ED Diagnoses Final diagnoses:  Influenza A    Rx / DC Orders ED Discharge Orders          Ordered    oseltamivir (TAMIFLU)  75 MG capsule  Every 12 hours        02/26/21 1550             Fredia Sorrow, MD 03/03/21 [phone]

## 2021-02-26 NOTE — Discharge Instructions (Addendum)
Take the Tamiflu as directed.  Continue to take Tylenol every 6 hours.  Continue to use your albuterol inhaler.  Continue to use your codeine cough medicine.  Return for any new or worse symptoms.  Follow back up with pulmonary medicine for next week for recheck.

## 2021-02-27 NOTE — Assessment & Plan Note (Signed)
Controlled.  Continue current therapy with olmesartan

## 2021-03-03 LAB — CULTURE, BLOOD (ROUTINE X 2)
Culture: NO GROWTH
Special Requests: ADEQUATE

## 2021-03-05 ENCOUNTER — Encounter (HOSPITAL_COMMUNITY)
Admission: EM | Disposition: A | Discharge status: Rehabilitation Facility | Payer: Self-pay | Source: Home / Self Care | Attending: Neurology

## 2021-03-05 ENCOUNTER — Inpatient Hospital Stay (HOSPITAL_COMMUNITY): Payer: Medicare Other

## 2021-03-05 ENCOUNTER — Inpatient Hospital Stay (HOSPITAL_COMMUNITY)
Admission: EM | Admit: 2021-03-05 | Discharge: 2021-03-09 | DRG: 034 | Disposition: A | Discharge status: Rehabilitation Facility | Payer: Medicare Other | Attending: Neurology | Admitting: Neurology

## 2021-03-05 ENCOUNTER — Emergency Department (HOSPITAL_COMMUNITY): Payer: Medicare Other | Admitting: Anesthesiology

## 2021-03-05 ENCOUNTER — Emergency Department (HOSPITAL_COMMUNITY): Payer: Medicare Other

## 2021-03-05 DIAGNOSIS — N1831 Chronic kidney disease, stage 3a: Secondary | ICD-10-CM | POA: Diagnosis present

## 2021-03-05 DIAGNOSIS — B59 Pneumocystosis: Secondary | ICD-10-CM | POA: Diagnosis not present

## 2021-03-05 DIAGNOSIS — R1313 Dysphagia, pharyngeal phase: Secondary | ICD-10-CM | POA: Diagnosis present

## 2021-03-05 DIAGNOSIS — I493 Ventricular premature depolarization: Secondary | ICD-10-CM | POA: Diagnosis not present

## 2021-03-05 DIAGNOSIS — R627 Adult failure to thrive: Secondary | ICD-10-CM | POA: Diagnosis not present

## 2021-03-05 DIAGNOSIS — N400 Enlarged prostate without lower urinary tract symptoms: Secondary | ICD-10-CM | POA: Diagnosis present

## 2021-03-05 DIAGNOSIS — J111 Influenza due to unidentified influenza virus with other respiratory manifestations: Secondary | ICD-10-CM | POA: Diagnosis not present

## 2021-03-05 DIAGNOSIS — R579 Shock, unspecified: Secondary | ICD-10-CM | POA: Diagnosis not present

## 2021-03-05 DIAGNOSIS — Z6828 Body mass index (BMI) 28.0-28.9, adult: Secondary | ICD-10-CM

## 2021-03-05 DIAGNOSIS — Z85528 Personal history of other malignant neoplasm of kidney: Secondary | ICD-10-CM | POA: Diagnosis not present

## 2021-03-05 DIAGNOSIS — G8194 Hemiplegia, unspecified affecting left nondominant side: Secondary | ICD-10-CM | POA: Diagnosis not present

## 2021-03-05 DIAGNOSIS — Z20822 Contact with and (suspected) exposure to covid-19: Secondary | ICD-10-CM | POA: Diagnosis not present

## 2021-03-05 DIAGNOSIS — Z961 Presence of intraocular lens: Secondary | ICD-10-CM | POA: Diagnosis present

## 2021-03-05 DIAGNOSIS — J189 Pneumonia, unspecified organism: Secondary | ICD-10-CM | POA: Diagnosis not present

## 2021-03-05 DIAGNOSIS — G8190 Hemiplegia, unspecified affecting unspecified side: Secondary | ICD-10-CM | POA: Diagnosis not present

## 2021-03-05 DIAGNOSIS — R414 Neurologic neglect syndrome: Secondary | ICD-10-CM | POA: Diagnosis present

## 2021-03-05 DIAGNOSIS — E43 Unspecified severe protein-calorie malnutrition: Secondary | ICD-10-CM | POA: Diagnosis not present

## 2021-03-05 DIAGNOSIS — R29708 NIHSS score 8: Secondary | ICD-10-CM | POA: Diagnosis not present

## 2021-03-05 DIAGNOSIS — R471 Dysarthria and anarthria: Secondary | ICD-10-CM | POA: Diagnosis not present

## 2021-03-05 DIAGNOSIS — Z79899 Other long term (current) drug therapy: Secondary | ICD-10-CM | POA: Diagnosis not present

## 2021-03-05 DIAGNOSIS — Z743 Need for continuous supervision: Secondary | ICD-10-CM | POA: Diagnosis not present

## 2021-03-05 DIAGNOSIS — I63511 Cerebral infarction due to unspecified occlusion or stenosis of right middle cerebral artery: Principal | ICD-10-CM | POA: Diagnosis present

## 2021-03-05 DIAGNOSIS — J449 Chronic obstructive pulmonary disease, unspecified: Secondary | ICD-10-CM | POA: Diagnosis not present

## 2021-03-05 DIAGNOSIS — E041 Nontoxic single thyroid nodule: Secondary | ICD-10-CM | POA: Diagnosis present

## 2021-03-05 DIAGNOSIS — R29810 Facial weakness: Secondary | ICD-10-CM | POA: Diagnosis not present

## 2021-03-05 DIAGNOSIS — Z87442 Personal history of urinary calculi: Secondary | ICD-10-CM | POA: Diagnosis not present

## 2021-03-05 DIAGNOSIS — B9689 Other specified bacterial agents as the cause of diseases classified elsewhere: Secondary | ICD-10-CM | POA: Diagnosis not present

## 2021-03-05 DIAGNOSIS — R531 Weakness: Secondary | ICD-10-CM | POA: Diagnosis not present

## 2021-03-05 DIAGNOSIS — I129 Hypertensive chronic kidney disease with stage 1 through stage 4 chronic kidney disease, or unspecified chronic kidney disease: Secondary | ICD-10-CM | POA: Diagnosis present

## 2021-03-05 DIAGNOSIS — Z885 Allergy status to narcotic agent status: Secondary | ICD-10-CM

## 2021-03-05 DIAGNOSIS — J159 Unspecified bacterial pneumonia: Secondary | ICD-10-CM | POA: Diagnosis not present

## 2021-03-05 DIAGNOSIS — I6601 Occlusion and stenosis of right middle cerebral artery: Secondary | ICD-10-CM | POA: Diagnosis not present

## 2021-03-05 DIAGNOSIS — I6523 Occlusion and stenosis of bilateral carotid arteries: Secondary | ICD-10-CM | POA: Diagnosis not present

## 2021-03-05 DIAGNOSIS — I6389 Other cerebral infarction: Secondary | ICD-10-CM | POA: Diagnosis not present

## 2021-03-05 DIAGNOSIS — I63231 Cerebral infarction due to unspecified occlusion or stenosis of right carotid arteries: Secondary | ICD-10-CM | POA: Diagnosis not present

## 2021-03-05 DIAGNOSIS — I6521 Occlusion and stenosis of right carotid artery: Secondary | ICD-10-CM | POA: Diagnosis not present

## 2021-03-05 DIAGNOSIS — I69398 Other sequelae of cerebral infarction: Secondary | ICD-10-CM | POA: Diagnosis not present

## 2021-03-05 DIAGNOSIS — I63031 Cerebral infarction due to thrombosis of right carotid artery: Secondary | ICD-10-CM | POA: Diagnosis not present

## 2021-03-05 DIAGNOSIS — N390 Urinary tract infection, site not specified: Secondary | ICD-10-CM | POA: Diagnosis not present

## 2021-03-05 DIAGNOSIS — E663 Overweight: Secondary | ICD-10-CM | POA: Diagnosis present

## 2021-03-05 DIAGNOSIS — E876 Hypokalemia: Secondary | ICD-10-CM | POA: Diagnosis not present

## 2021-03-05 DIAGNOSIS — R069 Unspecified abnormalities of breathing: Secondary | ICD-10-CM | POA: Diagnosis not present

## 2021-03-05 DIAGNOSIS — Z66 Do not resuscitate: Secondary | ICD-10-CM | POA: Diagnosis not present

## 2021-03-05 DIAGNOSIS — I6612 Occlusion and stenosis of left anterior cerebral artery: Secondary | ICD-10-CM | POA: Diagnosis not present

## 2021-03-05 DIAGNOSIS — Z006 Encounter for examination for normal comparison and control in clinical research program: Secondary | ICD-10-CM

## 2021-03-05 DIAGNOSIS — R1312 Dysphagia, oropharyngeal phase: Secondary | ICD-10-CM | POA: Diagnosis not present

## 2021-03-05 DIAGNOSIS — Z7189 Other specified counseling: Secondary | ICD-10-CM | POA: Diagnosis not present

## 2021-03-05 DIAGNOSIS — G4489 Other headache syndrome: Secondary | ICD-10-CM | POA: Diagnosis not present

## 2021-03-05 DIAGNOSIS — I6503 Occlusion and stenosis of bilateral vertebral arteries: Secondary | ICD-10-CM | POA: Diagnosis not present

## 2021-03-05 DIAGNOSIS — I10 Essential (primary) hypertension: Secondary | ICD-10-CM | POA: Diagnosis not present

## 2021-03-05 DIAGNOSIS — R300 Dysuria: Secondary | ICD-10-CM | POA: Diagnosis not present

## 2021-03-05 DIAGNOSIS — Z7982 Long term (current) use of aspirin: Secondary | ICD-10-CM

## 2021-03-05 DIAGNOSIS — Z905 Acquired absence of kidney: Secondary | ICD-10-CM

## 2021-03-05 DIAGNOSIS — I63239 Cerebral infarction due to unspecified occlusion or stenosis of unspecified carotid arteries: Secondary | ICD-10-CM

## 2021-03-05 DIAGNOSIS — M1990 Unspecified osteoarthritis, unspecified site: Secondary | ICD-10-CM | POA: Diagnosis not present

## 2021-03-05 DIAGNOSIS — Z8673 Personal history of transient ischemic attack (TIA), and cerebral infarction without residual deficits: Secondary | ICD-10-CM

## 2021-03-05 DIAGNOSIS — I69391 Dysphagia following cerebral infarction: Secondary | ICD-10-CM | POA: Diagnosis not present

## 2021-03-05 DIAGNOSIS — J0100 Acute maxillary sinusitis, unspecified: Secondary | ICD-10-CM | POA: Diagnosis not present

## 2021-03-05 DIAGNOSIS — I2510 Atherosclerotic heart disease of native coronary artery without angina pectoris: Secondary | ICD-10-CM | POA: Diagnosis not present

## 2021-03-05 DIAGNOSIS — Z7902 Long term (current) use of antithrombotics/antiplatelets: Secondary | ICD-10-CM

## 2021-03-05 DIAGNOSIS — R0902 Hypoxemia: Secondary | ICD-10-CM | POA: Diagnosis not present

## 2021-03-05 DIAGNOSIS — E785 Hyperlipidemia, unspecified: Secondary | ICD-10-CM | POA: Diagnosis not present

## 2021-03-05 DIAGNOSIS — E7800 Pure hypercholesterolemia, unspecified: Secondary | ICD-10-CM | POA: Diagnosis not present

## 2021-03-05 DIAGNOSIS — J690 Pneumonitis due to inhalation of food and vomit: Secondary | ICD-10-CM | POA: Diagnosis present

## 2021-03-05 DIAGNOSIS — Z888 Allergy status to other drugs, medicaments and biological substances status: Secondary | ICD-10-CM

## 2021-03-05 DIAGNOSIS — Z8249 Family history of ischemic heart disease and other diseases of the circulatory system: Secondary | ICD-10-CM

## 2021-03-05 DIAGNOSIS — Z515 Encounter for palliative care: Secondary | ICD-10-CM | POA: Diagnosis not present

## 2021-03-05 DIAGNOSIS — I69322 Dysarthria following cerebral infarction: Secondary | ICD-10-CM | POA: Diagnosis not present

## 2021-03-05 DIAGNOSIS — K219 Gastro-esophageal reflux disease without esophagitis: Secondary | ICD-10-CM | POA: Diagnosis not present

## 2021-03-05 DIAGNOSIS — Z87891 Personal history of nicotine dependence: Secondary | ICD-10-CM

## 2021-03-05 DIAGNOSIS — I69354 Hemiplegia and hemiparesis following cerebral infarction affecting left non-dominant side: Secondary | ICD-10-CM | POA: Diagnosis not present

## 2021-03-05 DIAGNOSIS — I639 Cerebral infarction, unspecified: Secondary | ICD-10-CM | POA: Diagnosis present

## 2021-03-05 DIAGNOSIS — D649 Anemia, unspecified: Secondary | ICD-10-CM | POA: Diagnosis not present

## 2021-03-05 DIAGNOSIS — Z8546 Personal history of malignant neoplasm of prostate: Secondary | ICD-10-CM

## 2021-03-05 DIAGNOSIS — R29898 Other symptoms and signs involving the musculoskeletal system: Secondary | ICD-10-CM | POA: Diagnosis not present

## 2021-03-05 DIAGNOSIS — J329 Chronic sinusitis, unspecified: Secondary | ICD-10-CM | POA: Diagnosis not present

## 2021-03-05 DIAGNOSIS — R918 Other nonspecific abnormal finding of lung field: Secondary | ICD-10-CM | POA: Diagnosis not present

## 2021-03-05 DIAGNOSIS — Z96652 Presence of left artificial knee joint: Secondary | ICD-10-CM | POA: Diagnosis present

## 2021-03-05 DIAGNOSIS — Z9079 Acquired absence of other genital organ(s): Secondary | ICD-10-CM

## 2021-03-05 HISTORY — PX: RADIOLOGY WITH ANESTHESIA: SHX6223

## 2021-03-05 HISTORY — PX: IR ANGIO INTRA EXTRACRAN SEL INTERNAL CAROTID UNI R MOD SED: IMG5362

## 2021-03-05 HISTORY — PX: IR INTRAVSC STENT CERV CAROTID W/EMB-PROT MOD SED INCL ANGIO: IMG2303

## 2021-03-05 HISTORY — PX: IR US GUIDE VASC ACCESS RIGHT: IMG2390

## 2021-03-05 LAB — CBC
HCT: 35.7 % — ABNORMAL LOW (ref 39.0–52.0)
Hemoglobin: 11.8 g/dL — ABNORMAL LOW (ref 13.0–17.0)
MCH: 29.7 pg (ref 26.0–34.0)
MCHC: 33.1 g/dL (ref 30.0–36.0)
MCV: 89.9 fL (ref 80.0–100.0)
Platelets: 208 10*3/uL (ref 150–400)
RBC: 3.97 MIL/uL — ABNORMAL LOW (ref 4.22–5.81)
RDW: 14.7 % (ref 11.5–15.5)
WBC: 12.5 10*3/uL — ABNORMAL HIGH (ref 4.0–10.5)
nRBC: 0 % (ref 0.0–0.2)

## 2021-03-05 LAB — DIFFERENTIAL
Abs Immature Granulocytes: 0.07 10*3/uL (ref 0.00–0.07)
Basophils Absolute: 0 10*3/uL (ref 0.0–0.1)
Basophils Relative: 0 %
Eosinophils Absolute: 0 10*3/uL (ref 0.0–0.5)
Eosinophils Relative: 0 %
Immature Granulocytes: 1 %
Lymphocytes Relative: 14 %
Lymphs Abs: 1.7 10*3/uL (ref 0.7–4.0)
Monocytes Absolute: 1.7 10*3/uL — ABNORMAL HIGH (ref 0.1–1.0)
Monocytes Relative: 13 %
Neutro Abs: 9 10*3/uL — ABNORMAL HIGH (ref 1.7–7.7)
Neutrophils Relative %: 72 %

## 2021-03-05 LAB — I-STAT CHEM 8, ED
BUN: 26 mg/dL — ABNORMAL HIGH (ref 8–23)
Calcium, Ion: 1.34 mmol/L (ref 1.15–1.40)
Chloride: 112 mmol/L — ABNORMAL HIGH (ref 98–111)
Creatinine, Ser: 1.2 mg/dL (ref 0.61–1.24)
Glucose, Bld: 85 mg/dL (ref 70–99)
HCT: 35 % — ABNORMAL LOW (ref 39.0–52.0)
Hemoglobin: 11.9 g/dL — ABNORMAL LOW (ref 13.0–17.0)
Potassium: 3.6 mmol/L (ref 3.5–5.1)
Sodium: 139 mmol/L (ref 135–145)
TCO2: 19 mmol/L — ABNORMAL LOW (ref 22–32)

## 2021-03-05 LAB — COMPREHENSIVE METABOLIC PANEL
ALT: 52 U/L — ABNORMAL HIGH (ref 0–44)
AST: 52 U/L — ABNORMAL HIGH (ref 15–41)
Albumin: 2.7 g/dL — ABNORMAL LOW (ref 3.5–5.0)
Alkaline Phosphatase: 111 U/L (ref 38–126)
Anion gap: 8 (ref 5–15)
BUN: 28 mg/dL — ABNORMAL HIGH (ref 8–23)
CO2: 18 mmol/L — ABNORMAL LOW (ref 22–32)
Calcium: 10.1 mg/dL (ref 8.9–10.3)
Chloride: 110 mmol/L (ref 98–111)
Creatinine, Ser: 1.26 mg/dL — ABNORMAL HIGH (ref 0.61–1.24)
GFR, Estimated: 58 mL/min — ABNORMAL LOW (ref >60)
Glucose, Bld: 86 mg/dL (ref 70–99)
Potassium: 3.5 mmol/L (ref 3.5–5.1)
Sodium: 136 mmol/L (ref 135–145)
Total Bilirubin: 1.1 mg/dL (ref 0.3–1.2)
Total Protein: 6.5 g/dL (ref 6.5–8.1)

## 2021-03-05 LAB — CBG MONITORING, ED: Glucose-Capillary: 88 mg/dL (ref 70–99)

## 2021-03-05 LAB — APTT: aPTT: 30 seconds (ref 24–36)

## 2021-03-05 LAB — MRSA NEXT GEN BY PCR, NASAL: MRSA by PCR Next Gen: NOT DETECTED

## 2021-03-05 LAB — PROTIME-INR
INR: 1.2 (ref 0.8–1.2)
Prothrombin Time: 15.5 seconds — ABNORMAL HIGH (ref 11.4–15.2)

## 2021-03-05 SURGERY — IR WITH ANESTHESIA
Anesthesia: Monitor Anesthesia Care

## 2021-03-05 MED ORDER — SODIUM CHLORIDE 0.9 % IV SOLN
INTRAVENOUS | Status: AC | PRN
  Administered 2021-03-05: 2 ug/kg/min via INTRAVENOUS

## 2021-03-05 MED ORDER — ENOXAPARIN SODIUM 40 MG/0.4ML IJ SOSY
40.0000 mg | PREFILLED_SYRINGE | INTRAMUSCULAR | Status: DC
  Administered 2021-03-06 – 2021-03-09 (×4): 40 mg via SUBCUTANEOUS
  Filled 2021-03-05 (×4): qty 0.4

## 2021-03-05 MED ORDER — PANTOPRAZOLE SODIUM 40 MG PO TBEC
40.0000 mg | DELAYED_RELEASE_TABLET | Freq: Two times a day (BID) | ORAL | Status: DC
  Administered 2021-03-05 – 2021-03-09 (×8): 40 mg via ORAL
  Filled 2021-03-05 (×8): qty 1

## 2021-03-05 MED ORDER — TICAGRELOR 90 MG PO TABS: 180.0000 mg | ORAL_TABLET | Freq: Once | ORAL | Status: AC

## 2021-03-05 MED ORDER — DM-GUAIFENESIN ER 30-600 MG PO TB12
1.0000 | ORAL_TABLET | Freq: Two times a day (BID) | ORAL | Status: DC
  Administered 2021-03-05 – 2021-03-07 (×4): 1 via ORAL
  Filled 2021-03-05 (×5): qty 1

## 2021-03-05 MED ORDER — TICAGRELOR 90 MG PO TABS
180.0000 mg | ORAL_TABLET | Freq: Once | ORAL | Status: AC
  Administered 2021-03-05: 180 mg via ORAL
  Filled 2021-03-05: qty 2

## 2021-03-05 MED ORDER — LACTATED RINGERS IV SOLN: INTRAVENOUS | Status: DC | PRN

## 2021-03-05 MED ORDER — LIDOCAINE HCL 1 % IJ SOLN
INTRAMUSCULAR | Status: AC
  Filled 2021-03-05: qty 20

## 2021-03-05 MED ORDER — ACETAMINOPHEN 325 MG PO TABS
650.0000 mg | ORAL_TABLET | Freq: Four times a day (QID) | ORAL | Status: DC | PRN
  Administered 2021-03-05 – 2021-03-09 (×5): 650 mg via ORAL
  Filled 2021-03-05 (×5): qty 2

## 2021-03-05 MED ORDER — FENTANYL CITRATE (PF) 250 MCG/5ML IJ SOLN
INTRAMUSCULAR | Status: AC
  Filled 2021-03-05: qty 5

## 2021-03-05 MED ORDER — TICAGRELOR 90 MG PO TABS
90.0000 mg | ORAL_TABLET | Freq: Two times a day (BID) | ORAL | Status: DC
  Administered 2021-03-06 – 2021-03-09 (×7): 90 mg via ORAL
  Filled 2021-03-05 (×7): qty 1

## 2021-03-05 MED ORDER — CLEVIDIPINE BUTYRATE 0.5 MG/ML IV EMUL: 0.0000 mg/h | INTRAVENOUS | Status: DC

## 2021-03-05 MED ORDER — FENTANYL CITRATE (PF) 250 MCG/5ML IJ SOLN
INTRAMUSCULAR | Status: DC | PRN
  Administered 2021-03-05: 25 ug via INTRAVENOUS
  Administered 2021-03-05: 50 ug via INTRAVENOUS

## 2021-03-05 MED ORDER — ASPIRIN EC 81 MG PO TBEC
81.0000 mg | DELAYED_RELEASE_TABLET | Freq: Every day | ORAL | Status: DC
  Administered 2021-03-05 – 2021-03-09 (×5): 81 mg via ORAL
  Filled 2021-03-05 (×5): qty 1

## 2021-03-05 MED ORDER — STROKE: EARLY STAGES OF RECOVERY BOOK
Freq: Once | Status: AC
  Filled 2021-03-05: qty 1

## 2021-03-05 MED ORDER — IOHEXOL 350 MG/ML SOLN
50.0000 mL | Freq: Once | INTRAVENOUS | Status: AC | PRN
  Administered 2021-03-05: 50 mL via INTRAVENOUS

## 2021-03-05 MED ORDER — VERAPAMIL HCL 2.5 MG/ML IV SOLN
INTRAVENOUS | Status: AC
  Filled 2021-03-05: qty 4

## 2021-03-05 MED ORDER — DULOXETINE HCL 30 MG PO CPEP
30.0000 mg | ORAL_CAPSULE | Freq: Every day | ORAL | Status: DC
  Administered 2021-03-05 – 2021-03-09 (×5): 30 mg via ORAL
  Filled 2021-03-05 (×5): qty 1

## 2021-03-05 MED ORDER — CANGRELOR BOLUS VIA INFUSION
INTRAVENOUS | Status: AC | PRN
Start: 2021-03-05 — End: 2021-03-05
  Administered 2021-03-05: 2859 ug via INTRAVENOUS

## 2021-03-05 MED ORDER — SODIUM CHLORIDE 0.9 % IV SOLN: INTRAVENOUS | Status: DC

## 2021-03-05 MED ORDER — CHLORHEXIDINE GLUCONATE CLOTH 2 % EX PADS
6.0000 | MEDICATED_PAD | Freq: Every day | CUTANEOUS | Status: DC
  Administered 2021-03-06 – 2021-03-08 (×3): 6 via TOPICAL

## 2021-03-05 MED ORDER — SODIUM CHLORIDE 0.9% FLUSH: 3.0000 mL | Freq: Once | INTRAVENOUS | Status: DC

## 2021-03-05 MED ORDER — MIDAZOLAM HCL 2 MG/2ML IJ SOLN
INTRAMUSCULAR | Status: AC
  Filled 2021-03-05: qty 2

## 2021-03-05 MED ORDER — PROPOFOL 500 MG/50ML IV EMUL
INTRAVENOUS | Status: DC | PRN
  Administered 2021-03-05: 50 ug/kg/min via INTRAVENOUS

## 2021-03-05 MED ORDER — EZETIMIBE 10 MG PO TABS
10.0000 mg | ORAL_TABLET | Freq: Every day | ORAL | Status: DC
  Administered 2021-03-05 – 2021-03-09 (×5): 10 mg via ORAL
  Filled 2021-03-05 (×5): qty 1

## 2021-03-05 MED ORDER — SODIUM CHLORIDE 0.9 % IV SOLN: INTRAVENOUS | Status: DC | PRN

## 2021-03-05 MED ORDER — SENNOSIDES-DOCUSATE SODIUM 8.6-50 MG PO TABS: 1.0000 | ORAL_TABLET | Freq: Every evening | ORAL | Status: DC | PRN

## 2021-03-05 MED ORDER — CANGRELOR TETRASODIUM 50 MG IV SOLR
INTRAVENOUS | Status: AC
  Filled 2021-03-05: qty 50

## 2021-03-05 MED ORDER — ATORVASTATIN CALCIUM 80 MG PO TABS
80.0000 mg | ORAL_TABLET | Freq: Every day | ORAL | Status: DC
  Administered 2021-03-05 – 2021-03-09 (×5): 80 mg via ORAL
  Filled 2021-03-05 (×5): qty 1

## 2021-03-05 MED ORDER — IOHEXOL 300 MG/ML  SOLN: 100.0000 mL | Freq: Once | INTRAMUSCULAR | Status: DC | PRN

## 2021-03-05 NOTE — Transfer of Care (Signed)
Immediate Anesthesia Transfer of Care Note  Patient: David [name]  Procedure(s) Performed: IR WITH ANESTHESIA  Patient Location: PACU  Anesthesia Type:MAC  Level of Consciousness: awake and alert   Airway & Oxygen Therapy: Patient Spontanous Breathing  Post-op Assessment: Report given to RN and Post -op Vital signs reviewed and stable  Post vital signs: Reviewed and stable  Last Vitals:  Vitals Value Taken Time  BP 126/70 03/05/21 1636  Temp    Pulse 76 03/05/21 1638  Resp 16 03/05/21 1638  SpO2 99 % 03/05/21 1638  Vitals shown include unvalidated device data.  Last Pain:  Vitals:   03/05/21 1545  TempSrc:   PainSc: 0-No pain         Complications: No notable events documented.

## 2021-03-05 NOTE — Code Documentation (Signed)
Stroke Response Nurse Documentation Code Documentation  David [name] is a 81 y.o. male arriving to [facility] ED via [facility] on 03/05/21 with past medical hx of HTN, HLD, prostate cancer, GERD. On aspirin 81 mg daily. Code stroke was activated by [facility]. BP 140/80, CBG 98.   Patient from home where he was LKW at 1000 and now complaining of left sided weakness, facial droop, dysarthria. Pt was normal this am until 1000 when at breakfast he noticed he couldn't pick up his napkin. He didn't say anything to his wife but at 65 he called her into the bathroom because he was having trouble pulling up his briefs. He told his wife about the left sided weakness and she called EMS.    Stroke team at the bedside on patient arrival. Labs drawn and patient cleared for CT by EDP. Patient to CT with team. NIHSS 8, see documentation for details and code stroke times. Patient with left hemianopia, right facial droop, left arm weakness, left leg weakness, dysarthria , and Visual  neglect on exam. The following imaging was completed:  CT/CTA head and neck. Patient is not a candidate for IV Thrombolytic due to outside window with LKW questionable. According to MD, pt's right ICA was almost fully occluded. She spoke with IR MD along with family members and it was consented with the patient to go to IR for a thrombectomy and potential stent placement. Code IR activated. Consent signed, pt prepped for procedure, a second IV placed. Pt taken to IR suite after it had been cleaned. Family was taken into the waiting area by the atrium and will be updated.   Care/Plan: IR then admission to 4NICU.   Bedside handoff with IR RN.    Shaneta Cervenka, Rande Brunt  Stroke Response RN

## 2021-03-05 NOTE — ED Triage Notes (Signed)
Patient was picking up napkin and noticed left side weakness, LNW 1000, recent + flu dx.

## 2021-03-05 NOTE — Anesthesia Preprocedure Evaluation (Signed)
Anesthesia Evaluation  Patient identified by MRN, date of birth, ID band Patient awake    Reviewed: Allergy & Precautions, H&P , NPO status , Patient's Chart, lab work & pertinent test resultsPreop documentation limited or incomplete due to emergent nature of procedure.  Airway Mallampati: II   Neck ROM: full    Dental   Pulmonary former smoker,    breath sounds clear to auscultation       Cardiovascular hypertension,  Rhythm:regular Rate:Normal     Neuro/Psych CODE STROKE CVA    GI/Hepatic GERD  ,  Endo/Other    Renal/GU stones     Musculoskeletal  (+) Arthritis ,   Abdominal   Peds  Hematology   Anesthesia Other Findings   Reproductive/Obstetrics                             Anesthesia Physical Anesthesia Plan  ASA: 3 and emergent  Anesthesia Plan: MAC   Post-op Pain Management:    Induction: Intravenous  PONV Risk Score and Plan: 1 and Propofol infusion and Treatment may vary due to age or medical condition  Airway Management Planned: Simple Face Mask  Additional Equipment:   Intra-op Plan:   Post-operative Plan:   Informed Consent: I have reviewed the patients History and Physical, chart, labs and discussed the procedure including the risks, benefits and alternatives for the proposed anesthesia with the patient or authorized representative who has indicated his/her understanding and acceptance.       Plan Discussed with: CRNA, Anesthesiologist and Surgeon  Anesthesia Plan Comments:         Anesthesia Quick Evaluation

## 2021-03-05 NOTE — Anesthesia Procedure Notes (Signed)
Procedure Name: MAC Date/Time: 03/05/2021 3:44 PM Performed by: Vonna Drafts, CRNA Pre-anesthesia Checklist: Patient identified, Emergency Drugs available, Suction available, Patient being monitored and Timeout performed Patient Re-evaluated:Patient Re-evaluated prior to induction Oxygen Delivery Method: Nasal cannula

## 2021-03-05 NOTE — H&P (Addendum)
NEUROLOGY H&P   Date of service: March 05, 2021 Patient Name: David [name] MRN:  [id number] DOB:  1940-03-17 Chief complaint: stroke code for L sided weakness _ _ _   _ __   _ __ _ _  __ __   _ __   __ _  History of Present Illness   This is a 81 year old man with a history of coronary artery disease, hyperlipidemia, hypertension, remote history of prostate cancer, renal oncocytoma of right kidney in 2014 who presented to the ED as a pretty activated stroke alert for left-sided weakness.  Patient is highly functional without baseline cognitive deficits and ambulates independently without assistance.  He states that this morning sometime around 10 AM he noticed that his left hand was not working properly when he went to grip a napkin while making himself cereal.  He was not definitely sure about his last known well.  Upon arrival to the hospital it was 2:13 PM and TNKase was not administered due to patient being unable to definitely confirm that his sx did not start within the previous 4.5 hours. He did not seek medical attention; but when his wife returned home and noticed his deficits this afternoon she called 911. NIHSS on arrival 8 for L facial droop, LUE and LLE weakness, dysarthria, and L visual neglect. CT head NAICP, ASPECTS 10. CTA H&N:  CTA neck:   1. Prominent irregular hypodensity within the proximal right ICA. A portion of this hypodensity likely reflects soft plaque. However, the irregular portion is highly suspicious for superimposed thrombus (or unstable plaque). Resultant severe stenosis of the proximal right ICA estimated to be 80%. 2. The left common carotid, left internal carotid and bilateral vertebral arteries are patent within the neck without significant stenosis. Mild atherosclerotic plaque within these vessels, as described. 3. 3 cm exophytic nodule extending inferiorly from the right thyroid lobe. This nodule was not described on the prior thyroid ultrasound of  02/27/2020. A repeat thyroid ultrasound is recommended to further characterize this nodule. 4.  Aortic Atherosclerosis (ICD10-I70.0).   CTA head:   1. No intracranial large vessel occlusion is identified. 2. Intracranial atherosclerotic disease with multifocal stenoses, most notably as follows. 3. Moderate/severe stenosis within a superior division mid M2 right MCA vessel. 4. Moderate stenosis within the A4 left anterior cerebral artery. 5. Moderate stenosis of the paraclinoid right ICA.  I personally reviewed all CNS imaging and discussed it by phone with neuroradiology and neuro-IR. 2/2 concern for superimposed thrombus or unstable plaque in the proximal R ICA with resultant severe stenosis Dr. Ladean Raya and I agreed that emergent stenting was indicated. I discussed the benefits and risks (latter including embolization and stroke during the procedure, and also risk of ICH) with patient, wife, and 2 adult daughters who all unanimously gave informed consent to proceed.   ROS   Per HPI: all other systems reviewed and are negative  Past History   I have reviewed the following:  Past Medical History:  Diagnosis Date   Allergy    Arthritis    gen.  and left hip   BPH (benign prostatic hypertrophy)    Coronary artery disease    GERD (gastroesophageal reflux disease)    occasional   History of kidney stones    2007   Hyperlipidemia    Hypertension    Kidney tumor    LBP (low back pain) 06/2005   L5 radicular symptoms; MRI of LS spine severe spondylosis at L4-L5 with central  cancal stenosis    Nocturia    Organic impotence    Prostate cancer ([place]) 10/2011   (Low grade) [facility] - Dr. Junious Silk   Pyelonephritis 03/05/2013   Renal oncocytoma of right kidney 01/23/2013   As 3.2 x 3.5 cm enhancing exophytic mass projecting off the lower pole. This is consistent with a renal cell carcinoma. No retroperitoneal lymphadenopathy.    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   Wears glasses    Past Surgical History:  Procedure Laterality Date   COLONOSCOPY  02/15/2006, 2014   Internal hemorrhoids (Dr [name]), last in 2014   [place] Bilateral 02/15/2013   Procedure: LYMPHADENECTOMY WITH INDOCYANINE GREEN DYE INJECTION;  Surgeon: [name], MD;  Location: [facility];  Service: Urology;  Laterality: Bilateral;   PROSTATE BIOPSY N/A 12/18/2012   Procedure: BIOPSY TRANSRECTAL ULTRASONIC PROSTATE (TUBP);  Surgeon: [name], MD;  Location[facility];  Service: Urology;  Laterality: N/A;   PROSTATE BIOPSY  12/05/11   gleason 6, 3/12 cores   REPAIR UMBILICAL AND VENTRAL HERNIA'S W/ MESH  08-21-2009   RIGHT/LEFT HEART CATH AND CORONARY ANGIOGRAPHY N/A 08/06/2019   Procedure: RIGHT/LEFT HEART CATH AND CORONARY ANGIOGRAPHY;  Surgeon: [name], Peter M, MD;  Location: [place] [facility];  Service: Cardiovascular;  Laterality: N/A;   ROBOT ASSISTED LAPAROSCOPIC RADICAL PROSTATECTOMY N/A 02/15/2013   Procedure: ROBOTIC ASSISTED LAPAROSCOPIC RADICAL PROSTATECTOMY;  Surgeon: [name], MD;  Location: [facility];  Service: Urology;  Laterality: N/A;   ROBOTIC ASSITED PARTIAL NEPHRECTOMY Right 02/15/2013   Procedure: ROBOTIC ASSITED PARTIAL NEPHRECTOMY;  Surgeon: [name], MD;  Location: [facility];  Service: Urology;  Laterality: Right;   SPERMATOCELECTOMY Left 12/18/2012   Procedure: LEFT SPERMATOCELECTOMY;  Surgeon: [name], MD;  Location: [facility];  Service: Urology;  Laterality: Left;   TONSILLECTOMY AND ADENOIDECTOMY  as child   TOTAL KNEE ARTHROPLASTY Left 05/26/2020   Procedure: TOTAL KNEE ARTHROPLASTY;  Surgeon: Paralee Cancel, MD;  Location: [facility];  Service: Orthopedics;  Laterality: Left;  70 mins   TRANSURETHRAL RESECTION OF PROSTATE  AGE 7   Family History  Problem Relation Age of Onset   Cancer Mother        colon    Colon cancer Mother 41   Pulmonary fibrosis Father        father owned a Radio broadcast assistant (father blamed inhalation of chemicals and fumes)   Pulmonary fibrosis Brother    Coronary artery disease Other    Hyperlipidemia Other    Colon polyps Neg Hx    Esophageal cancer Neg Hx    Rectal cancer Neg Hx    Stomach cancer Neg Hx    Social History   Socioeconomic History   Marital status: Married    Spouse name: Not on file   Number of children: 2   Years of education: Not on file   Highest education level: Not on file  Occupational History   Not on file  Tobacco Use   Smoking status: Former    Packs/day: 0.25    Years: 10.00    Pack years: 2.50    Types: Cigarettes    Quit date: 02/28/1981    Years since quitting: 40.0   Smokeless tobacco: Never  Vaping Use   Vaping Use: Never used  Substance and Sexual Activity   Alcohol use: Yes    Alcohol/week: 1.0 standard drink    Types: 1 Glasses of  wine per week    Comment: occas.   Drug use: Not Currently    Types: Flunitrazepam   Sexual activity: Not on file  Other Topics Concern   Not on file  Social History Narrative   Married- 2 yrs   2 daughters   5 grandchildren   Never Smoked   Alcohol use-yes (occasional/social)   Retired- Engineer, water      Physician roster:      Urologist - Dr. [name]   Orthopedics - Dr. [name]   Dermatology - Dr. [name]   Social Determinants of Health   Financial Resource Strain: Low Risk    Difficulty of Paying Living Expenses: Not hard at all  Food Insecurity: No Food Insecurity   Worried About Charity fundraiser in the Last Year: Never true   [place] in the Last Year: Never true  Transportation Needs: No Transportation Needs   Lack of Transportation (Medical): No   Lack of Transportation (Non-Medical): No  Physical Activity: Insufficiently Active   Days of Exercise per Week: 3 days   Minutes of Exercise per Session: 30 min  Stress: No Stress  Concern Present   Feeling of Stress : Not at all  Social Connections: Moderately Integrated   Frequency of Communication with Friends and Family: More than three times a week   Frequency of Social Gatherings with Friends and Family: Once a week   Attends Religious Services: More than 4 times per year   Active Member of [employer] or Organizations: No   Attends Music therapist: Never   Marital Status: Married   Allergies  Allergen Reactions   Codeine Itching    Severe hiccups   Ace Inhibitors Other (See Comments)    REACTION: Cough   Amlodipine     LE swelling   Hydrocodone Hives and Itching    Hiccoughs   Percocet [Oxycodone-Acetaminophen]     Severe hiccups    Medications   Medications Prior to Admission  Medication Sig Dispense Refill Last Dose   acetaminophen (TYLENOL) 500 MG tablet Take 500-1,000 mg by mouth every 6 (six) hours as needed for moderate pain.   03/05/2021   albuterol (VENTOLIN HFA) 108 (90 Base) MCG/ACT inhaler Inhale 2 puffs into the lungs every 6 (six) hours as needed for wheezing or shortness of breath (Cough). 18 g 1 03/04/2021   aspirin EC 81 MG tablet Take 1 tablet (81 mg total) by mouth daily. 100 tablet 3 03/04/2021   atorvastatin (LIPITOR) 80 MG tablet TAKE 1 TABLET(80 MG) BY MOUTH DAILY AT 6 PM 90 tablet 3 03/04/2021   cetirizine (ZYRTEC) 10 MG tablet Take 1 tablet (10 mg total) by mouth daily. 90 tablet 2 03/04/2021   DULoxetine (CYMBALTA) 30 MG capsule Take 30 mg by mouth daily.   03/04/2021   ezetimibe (ZETIA) 10 MG tablet Take 10 mg by mouth daily.   03/04/2021   fluticasone (FLONASE) 50 MCG/ACT nasal spray Place 2 sprays into both nostrils daily. 18 mL 2 Past Week   olmesartan (BENICAR) 40 MG tablet Take 1 tablet (40 mg total) by mouth daily. 90 tablet 3 03/04/2021   pantoprazole (PROTONIX) 40 MG tablet TAKE 1 TABLET BY MOUTH TWICE DAILY 60 tablet 11 03/05/2021   Vitamin D, Cholecalciferol, 50 MCG (2000 UT) CAPS Take 2,000 Units by  mouth daily.    03/04/2021   benzonatate (TESSALON) 200 MG capsule Take 1 capsule (200 mg total) by mouth 3 (three) times daily as  needed for cough. (Patient not taking: Reported on 03/05/2021) 60 capsule 1 Not Taking   chlorpheniramine-HYDROcodone (TUSSIONEX PENNKINETIC ER) 10-8 MG/5ML SUER Take 5 ml po bid x 1 week, then 2.5 ml bid x 1 week, then taper off and d/c (Patient not taking: Reported on 03/05/2021) 230 mL 0 Not Taking   ezetimibe (ZETIA) 10 MG tablet Take 1 tablet (10 mg total) by mouth daily. 90 tablet 3    ipratropium (ATROVENT) 0.06 % nasal spray Place 2 sprays into both nostrils 4 (four) times daily. As needed for nasal congestion, runny nose (Patient not taking: Reported on 03/05/2021) 15 mL 0 Completed Course   oseltamivir (TAMIFLU) 75 MG capsule Take 1 capsule (75 mg total) by mouth every 12 (twelve) hours. (Patient not taking: Reported on 03/05/2021) 10 capsule 0 Completed Course      Current Facility-Administered Medications:     stroke: mapping our early stages of recovery book, , Does not apply, Once, Derek Jack, MD   0.9 %  sodium chloride infusion, , Intravenous, Continuous, Derek Jack, MD, New Bag at 03/05/21 1533   aspirin EC tablet 81 mg, 81 mg, Oral, Daily, de Sindy Messing, [place], MD   atorvastatin (LIPITOR) tablet 80 mg, 80 mg, Oral, Daily, Derek Jack, MD   cangrelor [facility]) 50 MG SOLR, , , ,    [START ON 03/06/2021] Chlorhexidine Gluconate Cloth 2 % PADS 6 each, 6 each, Topical, Q0600, Derek Jack, MD   clevidipine (CLEVIPREX) infusion 0.5 mg/mL, 0-21 mg/hr, Intravenous, Continuous, de Sindy Messing, [place], MD   DULoxetine (CYMBALTA) DR capsule 30 mg, 30 mg, Oral, Daily, Derek Jack, MD   [START ON 03/06/2021] enoxaparin (LOVENOX) injection 40 mg, 40 mg, Subcutaneous, Q24H, Derek Jack, MD   ezetimibe (ZETIA) tablet 10 mg, 10 mg, Oral, Daily, Derek Jack, MD   iohexol (OMNIPAQUE) 300 MG/ML solution 100 mL, 100  mL, Intra-arterial, Once PRN, de Sindy Messing, Erven Colla, MD   lidocaine (XYLOCAINE) 1 % (with pres) injection, , , ,    pantoprazole (PROTONIX) EC tablet 40 mg, 40 mg, Oral, BID, Derek Jack, MD   senna-docusate (Senokot-S) tablet 1 tablet, 1 tablet, Oral, QHS PRN, Derek Jack, MD   sodium chloride flush (NS) 0.9 % injection 3 mL, 3 mL, Intravenous, Once, Gareth Morgan, MD   ticagrelor (BRILINTA) tablet 180 mg, 180 mg, Oral, Once **OR** ticagrelor (BRILINTA) tablet 90 mg, 90 mg, Per Tube, Once, de Sindy Messing, Erven Colla, MD  Vitals   Vitals:   03/05/21 1726 03/05/21 1730 03/05/21 1741 03/05/21 1757  BP: (!) 121/56  (!) 115/58 127/62  Pulse: 73 70 71 68  Resp: 16 18 18  (!) 22  Temp:    97.7 F (36.5 C)  TempSrc:      SpO2: 97% 98% 100% 90%  Weight:         Body mass index is 28.49 kg/m.  Physical Exam   Physical Exam Gen: alert, oriented x4, follows simple commands HEENT: Atraumatic, normocephalic;mucous membranes moist; oropharynx clear, tongue without atrophy or fasciculations. Neck: Supple, trachea midline. Resp: CTAB, no w/r/r CV: RRR, no m/g/r; nml S1 and S2. 2+ symmetric peripheral pulses. Abd: soft/NT/ND; nabs x 4 quad Extrem: Nml bulk; no cyanosis, clubbing, or edema.  Neuro: *MS: alert, oriented x4, follows simple commands *Speech: mild dysarthria, no aphasia *CN:    I: Deferred   II,III: PERRLA, L visual neglect, optic discs unable to be visualized 2/2 pupillary constriction   III,IV,VI: EOMI  w/o nystagmus, no ptosis   V: Sensation intact from V1 to V3 to LT   VII: Eyelid closure was full.  L UMN facial droop   VIII: Hearing intact to voice   IX,X: Voice normal, palate elevates symmetrically    XI: SCM/trap 5/5 bilat   XII: Tongue protrudes midline, no atrophy or fasciculations  *Motor:   Normal bulk.  No tremor, rigidity or bradykinesia. RUE and RLE full strength throughout. LUE drift but not to bed. LLE some movement against  gravity.  *Sensory: Intact to light touch, pinprick, temperature vibration throughout. Symmetric. Propioception intact bilat.  No double-simultaneous extinction.  *Coordination:  FNF and HTS intact on R *Reflexes:  2+ and symmetric throughout without clonus; toes down-going bilat *Gait: deferred  NIHSS  1a Level of Conscious.: 0 1b LOC Questions: 0 1c LOC Commands: 0 2 Best Gaze: 0 3 Visual: 1 4 Facial Palsy: 2 5a Motor Arm - left: 1 5b Motor Arm - Right: 0 6a Motor Leg - Left: 2 6b Motor Leg - Right: 0 7 Limb Ataxia: 0 8 Sensory: 0 9 Best Language: 0 10 Dysarthria: 1 11 Extinct. and Inatten.: 1  TOTAL: 8   Premorbid mRS = 1   Labs   CBC:  Recent Labs  Lab 03/05/21 1419 03/05/21 1420  WBC  --  12.5*  NEUTROABS  --  9.0*  HGB 11.9* 11.8*  HCT 35.0* 35.7*  MCV  --  89.9  PLT  --  628    Basic Metabolic Panel:  Lab Results  Component Value Date   NA 136 03/05/2021   K 3.5 03/05/2021   CO2 18 (L) 03/05/2021   GLUCOSE 86 03/05/2021   BUN 28 (H) 03/05/2021   CREATININE 1.26 (H) 03/05/2021   CALCIUM 10.1 03/05/2021   GFRNONAA 58 (L) 03/05/2021   GFRAA >60 08/06/2019   Lipid Panel:  Lab Results  Component Value Date   LDLCALC 106 (H) 11/18/2020   HgbA1c:  Lab Results  Component Value Date   HGBA1C 6.1 05/19/2020   Urine Drug Screen: No results found for: LABOPIA, COCAINSCRNUR, LABBENZ, AMPHETMU, THCU, LABBARB  Alcohol Level No results found for: ETH   Impression   This is a 81 year old man with a history of coronary artery disease, hyperlipidemia, hypertension, remote history of prostate cancer, renal oncocytoma of right kidney in 2014 who presented to the ED as a pretty activated stroke alert for left-sided weakness c/f R MCA acute infarct. Patient was unable to confirm sx onset within 4.5 hrs therefore TNK was not administered. 2/2 concern for superimposed thrombus or unstable plaque in the proximal R ICA with resultant severe stenosis Dr. Ladean Raya and I agreed that emergent stenting was indicated. I discussed the benefits and risks (latter including embolization and stroke during the procedure, and also risk of ICH) with patient, wife, and 2 adult daughters who all unanimously gave informed consent to proceed.  Recommendations   # R MCA infarct s/p emergent R ICA stenting for critical stenosis R carotid bulb # Hyperlipidemia # Hypertension - Bed rest x 6 hours;  may raise HOB 30 degree after 1.5 hour - BP goals per IR - Load brilinta 180 mg as soon as cleared to swallow - Pt currently on cangrelor - Do not stop cangrelor before brilinta is loaded - ASA 81mg  daily - MRI brain wo contrast - TTE - Continue home atorvastatin 80mg  and home zetia 50mg  daily - q4 hr neuro checks - STAT head CT for  any change in neuro exam - Tele - PT/OT/SLP - Stroke education - Amb referral to neurology upon discharge   NS 50cc/hr renal protection after dye load  DVT prophylaxis w/ lovenox  Stroke team will assume care tomorrow.  This patient is critically ill and at significant risk of neurological worsening, death and care requires constant monitoring of vital signs, hemodynamics,respiratory and cardiac monitoring, neurological assessment, discussion with family, other specialists and medical decision making of high complexity. I spent 110 minutes of neurocritical care time  in the care of  this patient. This was time spent independent of any time provided by nurse practitioner or PA.  Su Monks, MD [facility] [phone]  If 7pm- 7am, please page neurology on call as listed in [place].

## 2021-03-05 NOTE — ED Provider Notes (Signed)
[facility] EMERGENCY DEPARTMENT Provider Note   CSN: [id number] Arrival date & time: 03/05/21  1411     History  No chief complaint on file.   David [name] is a 81 y.o. male presenting for evaluation of L sided weakness.   Level V caveat due to acuity of condition.   Patient states around 10 he felt like his left arm and leg became weak. Wife states around 1230 patient called her into the bathroom, unable to pull his pants up due to numbness and weakness of his left hand.  Normally patient is fully independent, does not use a walker or cane.  Patient has had a cold for the past week, diagnosed with flu a week ago.  Takes aspirin, no other blood thinners.   Recent h/o sob, cough, and flu like sxs. Finished tamiflu, wife reports no improvement.     HPI     Home Medications Prior to Admission medications   Medication Sig Start Date End Date Taking? Authorizing Provider  albuterol (VENTOLIN HFA) 108 (90 Base) MCG/ACT inhaler Inhale 2 puffs into the lungs every 6 (six) hours as needed for wheezing or shortness of breath (Cough). 02/23/21   Lynden Oxford Scales, PA-C  aspirin EC 81 MG tablet Take 1 tablet (81 mg total) by mouth daily. 10/24/20 10/24/21  Plotnikov, [name], MD  atorvastatin (LIPITOR) 80 MG tablet TAKE 1 TABLET(80 MG) BY MOUTH DAILY AT 6 PM 12/03/20   Crenshaw, Denice Bors, MD  benzonatate (TESSALON) 200 MG capsule Take 1 capsule (200 mg total) by mouth 3 (three) times daily as needed for cough. 02/25/21   Plotnikov, [name], MD  cetirizine (ZYRTEC) 10 MG tablet Take 1 tablet (10 mg total) by mouth daily. 02/23/21   Lynden Oxford Scales, PA-C  chlorpheniramine-HYDROcodone (TUSSIONEX PENNKINETIC ER) 10-8 MG/5ML SUER Take 5 ml po bid x 1 week, then 2.5 ml bid x 1 week, then taper off and d/c 02/25/21   Plotnikov, [name], MD  ezetimibe (ZETIA) 10 MG tablet Take 1 tablet (10 mg total) by mouth daily. 11/30/20 02/28/21  Lelon Perla, MD   fluticasone (FLONASE) 50 MCG/ACT nasal spray Place 2 sprays into both nostrils daily. 02/23/21   Lynden Oxford Scales, PA-C  ipratropium (ATROVENT) 0.06 % nasal spray Place 2 sprays into both nostrils 4 (four) times daily. As needed for nasal congestion, runny nose 02/23/21   Lynden Oxford Scales, PA-C  Multiple Vitamin (MULTIVITAMIN) capsule Take 1 capsule by mouth daily.    [provider]  olmesartan (BENICAR) 40 MG tablet Take 1 tablet (40 mg total) by mouth daily. 08/13/20   Lelon Perla, MD  oseltamivir (TAMIFLU) 75 MG capsule Take 1 capsule (75 mg total) by mouth every 12 (twelve) hours. 02/26/21   Fredia Sorrow, MD  pantoprazole (PROTONIX) 40 MG tablet TAKE 1 TABLET BY MOUTH TWICE DAILY 11/10/20   Plotnikov, [name], MD  sodium chloride (OCEAN) 0.65 % nasal spray Place 1 spray into the nose daily as needed for congestion.    [provider]  Vitamin D, Cholecalciferol, 50 MCG (2000 UT) CAPS Take 2,000 Units by mouth daily.     [provider]      Allergies    Codeine, Ace inhibitors, Amlodipine, and Percocet [oxycodone-acetaminophen]    Review of Systems   Review of Systems  Eyes:  Positive for visual disturbance.  Neurological:  Positive for weakness and numbness.  All other systems reviewed and are negative.  Physical Exam  Updated Vital Signs BP (!) 124/57 (BP Location: Right Arm)    Pulse 83    Temp 97.9 F (36.6 C) (Oral)    Resp 19    SpO2 97%  Physical Exam Vitals and nursing note reviewed.  Constitutional:      General: He is not in acute distress.    Appearance: Normal appearance.     Comments: Resting in the bed in NAD  HENT:     Head: Normocephalic and atraumatic.  Eyes:     General: Visual field deficit present.     Pupils: Pupils are equal, round, and reactive to light.  Cardiovascular:     Rate and Rhythm: Normal rate and regular rhythm.     Pulses: Normal pulses.  Pulmonary:     Effort: Pulmonary effort is normal.  No respiratory distress.     Breath sounds: Normal breath sounds. No wheezing.     Comments: Speaking in full sentences.  Clear lung sounds in all fields. Abdominal:     General: There is no distension.     Palpations: Abdomen is soft. There is no mass.     Tenderness: There is no abdominal tenderness. There is no guarding or rebound.  Musculoskeletal:        General: Normal range of motion.     Cervical back: Normal range of motion and neck supple.  Skin:    General: Skin is warm and dry.     Capillary Refill: Capillary refill takes less than 2 seconds.  Neurological:     Mental Status: He is alert and oriented to person, place, and time.     GCS: GCS eye subscore is 4. GCS verbal subscore is 5. GCS motor subscore is 6.     Sensory: Sensation is intact.     Motor: Weakness and pronator drift present.     Comments: Slight pronator drift on the L side. L sided visual field deficit. Weakness of the L upper and L lower extremities.   Psychiatric:        Mood and Affect: Mood and affect normal.        Speech: Speech normal.        Behavior: Behavior normal.    ED Results / Procedures / Treatments   Labs (all labs ordered are listed, but only abnormal results are displayed) Labs Reviewed  PROTIME-INR - Abnormal; Notable for the following components:      Result Value   Prothrombin Time 15.5 (*)    All other components within normal limits  CBC - Abnormal; Notable for the following components:   WBC 12.5 (*)    RBC 3.97 (*)    Hemoglobin 11.8 (*)    HCT 35.7 (*)    All other components within normal limits  DIFFERENTIAL - Abnormal; Notable for the following components:   Neutro Abs 9.0 (*)    Monocytes Absolute 1.7 (*)    All other components within normal limits  COMPREHENSIVE METABOLIC PANEL - Abnormal; Notable for the following components:   CO2 18 (*)    BUN 28 (*)    Creatinine, Ser 1.26 (*)    Albumin 2.7 (*)    AST 52 (*)    ALT 52 (*)    GFR, Estimated 58 (*)     All other components within normal limits  I-STAT CHEM 8, ED - Abnormal; Notable for the following components:   Chloride 112 (*)    BUN 26 (*)    TCO2 19 (*)  Hemoglobin 11.9 (*)    HCT 35.0 (*)    All other components within normal limits  APTT  CBG MONITORING, ED    EKG None  Radiology CT ANGIO HEAD NECK W WO CM  Result Date: 03/05/2021 CLINICAL DATA:  Provided history: Neuro deficit, acute, stroke suspected. Left-sided weakness. EXAM: CT ANGIOGRAPHY HEAD AND NECK TECHNIQUE: Multidetector CT imaging of the head and neck was performed using the standard protocol during bolus administration of intravenous contrast. Multiplanar CT image reconstructions and MIPs were obtained to evaluate the vascular anatomy. Carotid stenosis measurements (when applicable) are obtained utilizing NASCET criteria, using the distal internal carotid diameter as the denominator. CONTRAST:  34mL OMNIPAQUE IOHEXOL 350 MG/ML SOLN COMPARISON:  Noncontrast head CT performed earlier today 03/05/2021. Thyroid ultrasound 02/27/2020. FINDINGS: CTA NECK FINDINGS Aortic arch: Standard aortic branching. Atherosclerotic plaque within the visualized aortic arch and proximal major branch vessels of the neck. Atherosclerotic plaque within the visualized aortic arch and proximal major branch vessels of the neck. No hemodynamically significant innominate or proximal subclavian artery stenosis. Right carotid system: CCA and ICA patent within the neck. Prominent irregular hypodensity within the proximal right ICA. A portion of this likely reflects soft plaque. However, the irregular portion is highly suspicious for superimposed thrombus/unstable plaque (for instance as seen on series 9, image 61). Resultant severe stenosis of the proximal ICA estimated to be 80% (series 7, image 327). Minimal calcified plaque is also present about the carotid bifurcation. Left carotid system: CCA and ICA patent within neck without stenosis. Minimal  atherosclerotic plaque about the carotid bifurcation and within the proximal ICA. Vertebral arteries: Vertebral arteries patent within the neck. Nonstenotic plaque at the origin of both vessels. The left vertebral artery is dominant. Skeleton: Partially imaged thoracic dextrocurvature. Cervical and upper thoracic spondylosis. Multilevel bridging ventral osteophytes within the imaged upper thoracic spine. No acute bony abnormality or aggressive osseous lesion. Other neck: No cervical lymphadenopathy. 3 cm exophytic nodule extending inferiorly from the right thyroid lobe in the right paratracheal region (for instance as seen on series 7, image 288). 8 mm nodule within the inferior right thyroid lobe. Upper chest: No consolidation within the imaged lung apices. Review of the MIP images confirms the above findings CTA HEAD FINDINGS Anterior circulation: The intracranial internal carotid arteries are patent. Atherosclerotic plaque within both vessels. Moderate stenosis of the paraclinoid right ICA. Mild stenosis of the paraclinoid left ICA. The M1 middle cerebral arteries are patent. No M2 proximal branch occlusion is identified. Atherosclerotic irregularity of the M2 and more distal MCA vessels, bilaterally. Most notably, there is an apparent moderate/severe stenosis within superior division mid M2 right MCA vessel (series 12, image 17). The anterior cerebral arteries are patent. Hypoplastic left A1 segment. Atherosclerotic irregularity of both anterior cerebral arteries. Most notably, there is a moderate stenosis within the A4 left ACA. No intracranial aneurysm is identified. Posterior circulation: The intracranial vertebral arteries are patent. The basilar artery is patent. The posterior cerebral arteries are patent. Posterior communicating arteries are diminutive or absent bilaterally. Venous sinuses: Within the limitations of contrast timing, no convincing thrombus. Anatomic variants: As described. Review of the MIP  images confirms the above findings CTA neck impression #1 and CTA head impressions #1 and #3 called by telephone at the time of interpretation on 03/05/2021 at 2:42 pm to provider Dr. Quinn Axe, who verbally acknowledged these results. IMPRESSION: CTA neck: 1. Prominent irregular hypodensity within the proximal right ICA. A portion of this hypodensity likely reflects soft plaque. However,  the irregular portion is highly suspicious for superimposed thrombus (or unstable plaque). Resultant severe stenosis of the proximal right ICA estimated to be 80%. 2. The left common carotid, left internal carotid and bilateral vertebral arteries are patent within the neck without significant stenosis. Mild atherosclerotic plaque within these vessels, as described. 3. 3 cm exophytic nodule extending inferiorly from the right thyroid lobe. This nodule was not described on the prior thyroid ultrasound of 02/27/2020. A repeat thyroid ultrasound is recommended to further characterize this nodule. 4.  Aortic Atherosclerosis (ICD10-I70.0). CTA head: 1. No intracranial large vessel occlusion is identified. 2. Intracranial atherosclerotic disease with multifocal stenoses, most notably as follows. 3. Moderate/severe stenosis within a superior division mid M2 right MCA vessel. 4. Moderate stenosis within the A4 left anterior cerebral artery. 5. Moderate stenosis of the paraclinoid right ICA. Electronically Signed   By: Kellie Simmering D.O.   On: 03/05/2021 15:10   CT HEAD CODE STROKE WO CONTRAST  Result Date: 03/05/2021 CLINICAL DATA:  Code stroke.  Left-sided weakness EXAM: CT HEAD WITHOUT CONTRAST TECHNIQUE: Contiguous axial images were obtained from the base of the skull through the vertex without intravenous contrast. COMPARISON:  None. FINDINGS: Brain: There is no evidence of acute intracranial hemorrhage, extra-axial fluid collection, or acute infarct. Parenchymal volume is normal. The ventricles are normal in size. There is no mass lesion.  There is no midline shift. Vascular: No dense vessel is seen. Skull: Normal. Negative for fracture or focal lesion. Sinuses/Orbits: There is layering fluid in the maxillary and sphenoid sinuses. Bilateral lens implants are in place. The globes and orbits are otherwise unremarkable. Other: None. ASPECTS [facility] Stroke Program Early CT Score) - Ganglionic level infarction (caudate, lentiform nuclei, internal capsule, insula, M1-M3 cortex): Seven - Supraganglionic infarction (M4-M6 cortex): 3 Total score (0-10 with 10 being normal): 10 IMPRESSION: 1. No acute intracranial pathology. 2. ASPECTS is 10 3. Layering fluid in the maxillary and sphenoid sinuses which can be seen with acute sinusitis in the correct clinical setting. These results were paged via AMION at the time of interpretation on 03/05/2021 at 2:29 pm to provider Dr Livia Snellen. Electronically Signed   By: Valetta Mole M.D.   On: 03/05/2021 14:32    Procedures .Critical Care Performed by: Franchot Heidelberg, PA-C Authorized by: Franchot Heidelberg, PA-C   Critical care provider statement:    Critical care time (minutes):  40   Critical care time was exclusive of:  Separately billable procedures and treating other patients and teaching time   Critical care was necessary to treat or prevent imminent or life-threatening deterioration of the following conditions:  CNS failure or compromise   Critical care was time spent personally by me on the following activities:  Blood draw for specimens, development of treatment plan with patient or surrogate, evaluation of patient's response to treatment, examination of patient, obtaining history from patient or surrogate, ordering and performing treatments and interventions, ordering and review of laboratory studies, ordering and review of radiographic studies, pulse oximetry, re-evaluation of patient's condition and review of old charts   I assumed direction of critical care for this patient from another provider in  my specialty: no     Care discussed with: admitting provider      Medications Ordered in ED Medications  sodium chloride flush (NS) 0.9 % injection 3 mL (has no administration in time range)  verapamil (ISOPTIN) 2.5 MG/ML injection (has no administration in time range)  cangrelor (KENGREAL) 50 MG SOLR (has no administration in time  range)  iohexol (OMNIPAQUE) 350 MG/ML injection 50 mL (50 mLs Intravenous Contrast Given 03/05/21 1435)    ED Course/ Medical Decision Making/ A&P                           Medical Decision Making   This patient presents to the ED for concern of L arm and leg weakness. This involves an extensive number of treatment options, and is a complaint that carries with it a high risk of complications and morbidity.  The differential diagnosis includes stoke, tia, dissection.    Co morbidities:  Htn, carotid stenosis   Additional history: Reviewed recent ER visit. Additional history obtained from pt's wife.   Lab Tests:  I ordered, and personally interpreted labs.  The pertinent results include: Mild leukocytosis of 12.5, likely reactive.  Electrolytes overall reassuring.  Minimally elevated LFTs, likely due to recent flu.   Imaging Studies:  I ordered imaging studies including CT head which is negative.  CTA head and neck shows M2 lesion and ICA plaque/stenosis.  Cardiac Monitoring:  The patient was maintained on a cardiac monitor.  I personally viewed and interpreted the cardiac monitored which showed an underlying rhythm of: NSR   Consults:  I requested consultation with the neurologist. Discussed with Dr. Quinn Axe from neurology. Pt to go for IR procedure and will be admitted by neuro to neuro ICU.    Dispostion: Admit to neuro team under Dr. Quinn Axe.     Final Clinical Impression(s) / ED Diagnoses Final diagnoses:  Cerebrovascular accident (CVA), unspecified mechanism [facility])    Rx / [place] Orders ED Discharge Orders     None          Franchot Heidelberg, PA-C 03/05/21 1541    Gareth Morgan, MD 03/05/21 2229

## 2021-03-05 NOTE — ED Notes (Signed)
Patient transported to IR 

## 2021-03-05 NOTE — Sedation Documentation (Signed)
Right carotid stent deployed at 1607. Right groin site closed with Perclose at 1621.  Right groin site level 0 and dressed with gauze/tegaderm, clean/dry/intact.   Pulses palpable.   Handoff given to Arnette Norris, RN in PACU.  Waiting on clean bed on 4N ICU.

## 2021-03-05 NOTE — Procedures (Signed)
INTERVENTIONAL NEURORADIOLOGY BRIEF POSTPROCEDURE NOTE  DIAGNOSTIC CEREBRAL ANGIOGRAM RIGHT CAROTID STENTING WITH CEREBRAL PROTECTION DEVICE  Attending: Dr. [name]  Assistant: None.  Diagnosis: Critical stenosis, right carotid bulb  Access site: RCFA, 52F  Access closure: Perclose Prostyle  Anesthesia: MAC  Medication used: Refer to anesthesia documentation.  Complications: None.  Estimated blood loss: 50 mL  Specimen: None.  Findings: Atherosclerotic changes of the right carotid bifurcation with associated thrombus and approximately 80-90% stenosis at the right carotid bulb. Stenting performed with cerebral protection device, a 6-8 x 40 mm XACT carotid stent was utilized. No evidence of thromboembolic complication.  The patient tolerated the procedure well without incident or complication and is in stable condition.   PLAN:  - Bed rest x 6 hours;  may raise HOB 30 degree after 1.5 hour - SBP 120-140 mmHg - Load brilinta 180 mg as soon as cleared to swallow - ASA 81 mg incase pt has not had his ASA today - Do not stop cangrelor before brilinta is loaded.

## 2021-03-06 ENCOUNTER — Inpatient Hospital Stay (HOSPITAL_COMMUNITY): Payer: Medicare Other

## 2021-03-06 DIAGNOSIS — I6389 Other cerebral infarction: Secondary | ICD-10-CM

## 2021-03-06 DIAGNOSIS — I63231 Cerebral infarction due to unspecified occlusion or stenosis of right carotid arteries: Secondary | ICD-10-CM

## 2021-03-06 DIAGNOSIS — I493 Ventricular premature depolarization: Secondary | ICD-10-CM

## 2021-03-06 DIAGNOSIS — I6521 Occlusion and stenosis of right carotid artery: Secondary | ICD-10-CM

## 2021-03-06 DIAGNOSIS — E7800 Pure hypercholesterolemia, unspecified: Secondary | ICD-10-CM

## 2021-03-06 LAB — CBC
HCT: 32.2 % — ABNORMAL LOW (ref 39.0–52.0)
Hemoglobin: 10.7 g/dL — ABNORMAL LOW (ref 13.0–17.0)
MCH: 29.6 pg (ref 26.0–34.0)
MCHC: 33.2 g/dL (ref 30.0–36.0)
MCV: 89.2 fL (ref 80.0–100.0)
Platelets: 191 10*3/uL (ref 150–400)
RBC: 3.61 MIL/uL — ABNORMAL LOW (ref 4.22–5.81)
RDW: 14.7 % (ref 11.5–15.5)
WBC: 12.9 10*3/uL — ABNORMAL HIGH (ref 4.0–10.5)
nRBC: 0 % (ref 0.0–0.2)

## 2021-03-06 LAB — BASIC METABOLIC PANEL
Anion gap: 6 (ref 5–15)
BUN: 21 mg/dL (ref 8–23)
CO2: 18 mmol/L — ABNORMAL LOW (ref 22–32)
Calcium: 9.8 mg/dL (ref 8.9–10.3)
Chloride: 112 mmol/L — ABNORMAL HIGH (ref 98–111)
Creatinine, Ser: 0.99 mg/dL (ref 0.61–1.24)
GFR, Estimated: >60 mL/min (ref >60)
Glucose, Bld: 98 mg/dL (ref 70–99)
Potassium: 3.7 mmol/L (ref 3.5–5.1)
Sodium: 136 mmol/L (ref 135–145)

## 2021-03-06 LAB — HEPATIC FUNCTION PANEL
ALT: 44 U/L (ref 0–44)
AST: 44 U/L — ABNORMAL HIGH (ref 15–41)
Albumin: 2.4 g/dL — ABNORMAL LOW (ref 3.5–5.0)
Alkaline Phosphatase: 98 U/L (ref 38–126)
Bilirubin, Direct: 0.3 mg/dL — ABNORMAL HIGH (ref 0.0–0.2)
Indirect Bilirubin: 0.8 mg/dL (ref 0.3–0.9)
Total Bilirubin: 1.1 mg/dL (ref 0.3–1.2)
Total Protein: 5.9 g/dL — ABNORMAL LOW (ref 6.5–8.1)

## 2021-03-06 LAB — LIPID PANEL
Cholesterol: 77 mg/dL (ref 0–200)
HDL: 15 mg/dL — ABNORMAL LOW (ref >40)
LDL Cholesterol: 46 mg/dL (ref 0–99)
Total CHOL/HDL Ratio: 5.1 RATIO
Triglycerides: 78 mg/dL (ref <150)
VLDL: 16 mg/dL (ref 0–40)

## 2021-03-06 LAB — ECHOCARDIOGRAM COMPLETE
AR max vel: 3.14 cm2
AV Area VTI: 3.49 cm2
AV Area mean vel: 3.59 cm2
AV Mean grad: 7 mmHg
AV Peak grad: 14.1 mmHg
Ao pk vel: 1.88 m/s
Area-P 1/2: 2.87 cm2
S' Lateral: 2.9 cm
Weight: 3361.57 oz

## 2021-03-06 LAB — RESP PANEL BY RT-PCR (FLU A&B, COVID) ARPGX2
Influenza A by PCR: NEGATIVE
Influenza B by PCR: NEGATIVE
SARS Coronavirus 2 by RT PCR: NEGATIVE

## 2021-03-06 LAB — HEMOGLOBIN A1C
Hgb A1c MFr Bld: 6.3 % — ABNORMAL HIGH (ref 4.8–5.6)
Mean Plasma Glucose: 134.11 mg/dL

## 2021-03-06 MED ORDER — BENZONATATE 100 MG PO CAPS
100.0000 mg | ORAL_CAPSULE | Freq: Three times a day (TID) | ORAL | Status: DC | PRN
  Administered 2021-03-06 – 2021-03-07 (×4): 100 mg via ORAL
  Filled 2021-03-06 (×5): qty 1

## 2021-03-06 MED ORDER — PERFLUTREN LIPID MICROSPHERE
1.0000 mL | INTRAVENOUS | Status: AC | PRN
  Administered 2021-03-06: 3 mL via INTRAVENOUS
  Filled 2021-03-06: qty 10

## 2021-03-06 MED ORDER — MAGIC MOUTHWASH
5.0000 mL | Freq: Three times a day (TID) | ORAL | Status: DC | PRN
  Administered 2021-03-06 (×2): 5 mL via ORAL
  Filled 2021-03-06 (×3): qty 5

## 2021-03-06 MED ORDER — ORAL CARE MOUTH RINSE
15.0000 mL | Freq: Two times a day (BID) | OROMUCOSAL | Status: DC
  Administered 2021-03-06 – 2021-03-09 (×6): 15 mL via OROMUCOSAL

## 2021-03-06 MED ORDER — LABETALOL HCL 5 MG/ML IV SOLN: 5.0000 mg | INTRAVENOUS | Status: DC | PRN

## 2021-03-06 NOTE — Evaluation (Signed)
Occupational Therapy Evaluation Patient Details Name: David [name] MRN: [id number] DOB: 1940/09/11 Today's Date: 03/06/2021   History of Present Illness 81 y/o male presented to ED on 03/05/21 for L sided weakness. Recent flue dx. CTA showed R ICA occlusion. S/p cerebral angiogram and R carotid stenting on 1/6. MRI showed multifocal acute/early subacute cortical ischemia in anterior R MCA and small focus of acute/early subacute ischemia at L caudate head. PMH: HTN, CAD, prostate cancer   Clinical Impression   PTA pt lives independently at home with his wife. Pt presents with significant functional decline due to deficits listed below. Required Min A +2 HHA for limited safe mobility and mod to Max A for ADL tasks. Activity limited by generalized weakness and "not feeling well".  Pt with increased WOB with activity. VSS throughout on RA.  Nsg aware. Given decline in functional level, recommend intensive rehab at CIR to facilitate safe DC home. Acute OT to follow.      Recommendations for follow up therapy are one component of a multi-disciplinary discharge planning process, led by the attending physician.  Recommendations may be updated based on patient status, additional functional criteria and insurance authorization.   Follow Up Recommendations  Acute inpatient rehab (3hours/day)    Assistance Recommended at Discharge Intermittent Supervision/Assistance  Patient can return home with the following A little help with walking and/or transfers;A lot of help with bathing/dressing/bathroom;Assistance with cooking/housework;Direct supervision/assist for medications management;Direct supervision/assist for financial management;Assist for transportation;Help with stairs or ramp for entrance    Functional Status Assessment  Patient has had a recent decline in their functional status and demonstrates the ability to make significant improvements in function in a reasonable and predictable amount of time.   Equipment Recommendations  BSC/3in1    Recommendations for Other Services Rehab consult     Precautions / Restrictions Precautions Precautions: Fall Precaution Comments: covid being ruled out      Mobility Bed Mobility Overal bed mobility: Needs Assistance Bed Mobility: Supine to Sit     Supine to sit: Min assist          Transfers Overall transfer level: Needs assistance Equipment used: 2 person hand held assist Transfers: Sit to/from Stand;Bed to chair/wheelchair/BSC Sit to Stand: Min assist;+2 physical assistance     Step pivot transfers: Min assist;+2 physical assistance; limited by decreased activity tolerance this date            Balance Overall balance assessment: Needs assistance   Sitting balance-Leahy Scale: Fair       Standing balance-Leahy Scale: Poor                             ADL either performed or assessed with clinical judgement   ADL Overall ADL's : Needs assistance/impaired Eating/Feeding: Supervision/ safety;Set up   Grooming: Minimal assistance   Upper Body Bathing: Minimal assistance   Lower Body Bathing: Moderate assistance;Sit to/from stand   Upper Body Dressing : Moderate assistance   Lower Body Dressing: Maximal assistance   Toilet Transfer: Minimal assistance;+2 for physical assistance   Toileting- Clothing Manipulation and Hygiene: Total assistance Toileting - Clothing Manipulation Details (indicate cue type and reason): incontinenet of urine; staes he had prostrate surgery adn hasn't had a "wet night since surgery"; unsure if urinary incontinence is new     Functional mobility during ADLs: Minimal assistance;+2 for physical assistance General ADL Comments: Very slow movement patterns     Vision Baseline Vision/History: 1  Wears glasses Patient Visual Report: No change from baseline Vision Assessment?: Vision impaired- to be further tested in functional context;Yes Eye Alignment: Within Functional  Limits Ocular Range of Motion: Within Functional Limits Alignment/Gaze Preference: Within Defined Limits Tracking/Visual Pursuits: Decreased smoothness of horizontal tracking;Decreased smoothness of vertical tracking Saccades: Additional eye shifts occurred during testing;Additional head turns occurred during testing Convergence: Within functional limits Visual Fields: Other (comment) (identifying targets in all fields) Additional Comments: poor visual attneiotn; unableto read at this time - needs glasses? Pt unable to clarify     Perception Perception Comments: Will further assess when more alert   Praxis Praxis Praxis-Other Comments: slow movements; will continue to assess    Pertinent Vitals/Pain Pain Assessment: No/denies pain     Hand Dominance Right   Extremity/Trunk Assessment Upper Extremity Assessment Upper Extremity Assessment: LUE deficits/detail LUE Deficits / Details: Greater  proximal strength although weak shoulder; able to complete hand to move and fle3x shoulder @ 90; isolated movements; gross grasp/release; unable to make full fist; unable to oppose thumb to digits with any strength; absent in-hand manipulation skills; attempts to use functionally, repetitively, even when he is not being successful with use of L hand LUE Coordination: decreased fine motor;decreased gross motor   Lower Extremity Assessment Lower Extremity Assessment: Defer to PT evaluation   Cervical / Trunk Assessment Cervical / Trunk Assessment: Normal   Communication Communication Communication:  (? mild dysarthria)   Cognition Arousal/Alertness: Lethargic Behavior During Therapy: Flat affect Overall Cognitive Status: Impaired/Different from baseline Area of Impairment: Attention;Safety/judgement;Awareness;Problem solving                   Current Attention Level: Selective     Safety/Judgement: Decreased awareness of safety;Decreased awareness of deficits Awareness:  Emergent Problem Solving: Slow processing General Comments: Will further assess     General Comments  Increased SOB with productive cough    Exercises     Shoulder Instructions      Home Living Family/patient expects to be discharged to:: Private residence Living Arrangements: Spouse/significant other Available Help at Discharge: Available 24 hours/day (wife - S) Type of Home: House Home Access: Stairs to enter [employer] of Steps: 2 Entrance Stairs-Rails: Can reach both;Left;Right Home Layout: One level     Bathroom Shower/Tub: Walk-in shower;Tub/shower unit   Bathroom Toilet: Handicapped height Bathroom Accessibility: Yes How Accessible: Accessible via walker;Accessible via wheelchair Home Equipment: Rolling Walker (2 wheels);Grab bars - toilet;Grab bars - tub/shower          Prior Functioning/Environment Prior Level of Function : Independent/Modified Independent                        OT Problem List: Decreased strength;Decreased range of motion;Decreased activity tolerance;Impaired balance (sitting and/or standing);Impaired vision/perception;Decreased coordination;Decreased cognition;Decreased safety awareness;Decreased knowledge of use of DME or AE;Cardiopulmonary status limiting activity;Impaired UE functional use      OT Treatment/Interventions: Self-care/ADL training;Therapeutic exercise;Neuromuscular education;Energy conservation;DME and/or AE instruction;Therapeutic activities;Cognitive remediation/compensation;Visual/perceptual remediation/compensation;Patient/family education;Balance training    OT Goals(Current goals can be found in the care plan section) Acute Rehab OT Goals Patient Stated Goal: to get better OT Goal Formulation: With patient Time For Goal Achievement: 03/20/21 Potential to Achieve Goals: Good  OT Frequency: Min 2X/week    Co-evaluation PT/OT/SLP Co-Evaluation/Treatment: Yes Reason for Co-Treatment: For  patient/therapist safety;To address functional/ADL transfers   OT goals addressed during session: ADL's and self-care      AM-PAC OT "6 Clicks" Daily Activity  Outcome Measure Help from another person eating meals?: A Little Help from another person taking care of personal grooming?: A Little Help from another person toileting, which includes using toliet, bedpan, or urinal?: Total Help from another person bathing (including washing, rinsing, drying)?: A Lot Help from another person to put on and taking off regular upper body clothing?: A Lot Help from another person to put on and taking off regular lower body clothing?: A Lot 6 Click Score: 13   End of Session Nurse Communication: Mobility status  Activity Tolerance: Patient limited by fatigue Patient left: in chair;with call bell/phone within reach;with chair alarm set  OT Visit Diagnosis: Other abnormalities of gait and mobility (R26.89);Muscle weakness (generalized) (M62.81);Other symptoms and signs involving cognitive function;Other symptoms and signs involving the nervous system (R29.898)                Time: 1410-1441 OT Time Calculation (min): 31 min Charges:  OT General Charges $OT Visit: 1 Visit OT Evaluation $OT Eval Moderate Complexity: [place], OT/L   Acute OT Clinical Specialist Acute Rehabilitation Services Pager [phone] Office [phone]   [facility] 03/06/2021, 3:00 PM

## 2021-03-06 NOTE — Evaluation (Signed)
Physical Therapy Evaluation Patient Details Name: David [name] MRN: [id number] DOB: 04-13-1940 Today's Date: 03/06/2021  History of Present Illness  81 y/o male presented to ED on 03/05/21 for L sided weakness. Recent flue dx. CTA showed R ICA occlusion. S/p cerebral angiogram and R carotid stenting on 1/6. MRI showed multifocal acute/early subacute cortical ischemia in anterior R MCA and small focus of acute/early subacute ischemia at L caudate head. PMH: HTN, CAD, prostate cancer  Clinical Impression  Patient admitted with above diagnosis. Patient presents with L sided weakness, impaired balance, impaired coordination, decreased activity tolerance, and impaired cognition. Patient lethargic this date with noted SOB although VSS on RA. Patient required minA+2 for bed to chair transfer this date with HHA. Patient with productive cough throughout session. Patient will benefit from skilled PT services during acute stay to address listed deficits. Recommend CIR at discharge to maximize functional independence as patient was independent PTA.        Recommendations for follow up therapy are one component of a multi-disciplinary discharge planning process, led by the attending physician.  Recommendations may be updated based on patient status, additional functional criteria and insurance authorization.  Follow Up Recommendations Acute inpatient rehab (3hours/day)    Assistance Recommended at Discharge Frequent or constant Supervision/Assistance  Patient can return home with the following  A lot of help with walking and/or transfers;A lot of help with bathing/dressing/bathroom;Assistance with cooking/housework;Assistance with feeding;Direct supervision/assist for medications management;Direct supervision/assist for financial management;Assist for transportation;Help with stairs or ramp for entrance    Equipment Recommendations Other (comment) (TBD)  Recommendations for Other Services  Rehab consult     Functional Status Assessment Patient has had a recent decline in their functional status and demonstrates the ability to make significant improvements in function in a reasonable and predictable amount of time.     Precautions / Restrictions Precautions Precautions: Fall Precaution Comments: covid being ruled out Restrictions Weight Bearing Restrictions: No      Mobility  Bed Mobility Overal bed mobility: Needs Assistance Bed Mobility: Supine to Sit     Supine to sit: Min assist          Transfers Overall transfer level: Needs assistance Equipment used: 2 person hand held assist Transfers: Sit to/from Stand;Bed to chair/wheelchair/BSC Sit to Stand: Min assist;+2 physical assistance   Step pivot transfers: Min assist;+2 physical assistance            Ambulation/Gait                  Stairs            Wheelchair Mobility    Modified Rankin (Stroke Patients Only)       Balance Overall balance assessment: Needs assistance Sitting-balance support: No upper extremity supported;Feet supported Sitting balance-Leahy Scale: Fair     Standing balance support: Bilateral upper extremity supported Standing balance-Leahy Scale: Poor                               Pertinent Vitals/Pain Pain Assessment: No/denies pain    Home Living Family/patient expects to be discharged to:: Private residence Living Arrangements: Spouse/significant other Available Help at Discharge: Available 24 hours/day (wife - S) Type of Home: House Home Access: Stairs to enter Entrance Stairs-Rails: Can reach both;Left;Right Entrance Stairs-Number of Steps: 2   Home Layout: One level Home Equipment: Rolling [place] (2 wheels);Grab bars - toilet;Grab bars - tub/shower      Prior  Function Prior Level of Function : Independent/Modified Independent                     Hand Dominance   Dominant Hand: Right    Extremity/Trunk Assessment   Upper  Extremity Assessment Upper Extremity Assessment: Defer to OT evaluation LUE Deficits / Details: Greater  proximal strength although weak shoulder; able to complete hand to move and fle3x shoulder @ 90; isolated movements; gross grasp/release; unable to make full fist; unable to oppose thumb to digits with any strength; absent in-hand manipulation skills; attempts to use functionally, repetitively, even when he is not being successful with use of L hand LUE Coordination: decreased fine motor;decreased gross motor    Lower Extremity Assessment Lower Extremity Assessment: LLE deficits/detail LLE Deficits / Details: grossly 4-/5 LLE Coordination: decreased fine motor;decreased gross motor    Cervical / Trunk Assessment Cervical / Trunk Assessment: Normal  Communication   Communication:  (? mild dysarthria)  Cognition Arousal/Alertness: Lethargic Behavior During Therapy: Flat affect Overall Cognitive Status: Impaired/Different from baseline Area of Impairment: Attention;Safety/judgement;Awareness;Problem solving                   Current Attention Level: Selective     Safety/Judgement: Decreased awareness of safety;Decreased awareness of deficits Awareness: Emergent Problem Solving: Slow processing General Comments: Will further assess        General Comments General comments (skin integrity, edema, etc.): Increased SOB with productive cough    Exercises     Assessment/Plan    PT Assessment Patient needs continued PT services  PT Problem List Decreased strength;Decreased activity tolerance;Decreased balance;Decreased mobility;Decreased coordination;Decreased cognition;Decreased knowledge of use of DME;Decreased safety awareness;Decreased knowledge of precautions       PT Treatment Interventions DME instruction;Gait training;Stair training;Functional mobility training;Therapeutic exercise;Balance training;Therapeutic activities;Neuromuscular re-education;Patient/family  education    PT Goals (Current goals can be found in the Care Plan section)  Acute Rehab PT Goals Patient Stated Goal: did not state PT Goal Formulation: With patient Time For Goal Achievement: 03/20/21 Potential to Achieve Goals: Fair    Frequency Min 4X/week     Co-evaluation PT/OT/SLP Co-Evaluation/Treatment: Yes Reason for Co-Treatment: For patient/therapist safety;To address functional/ADL transfers PT goals addressed during session: Mobility/safety with mobility;Balance OT goals addressed during session: ADL's and self-care       AM-PAC PT "6 Clicks" Mobility  Outcome Measure Help needed turning from your back to your side while in a flat bed without using bedrails?: A Little Help needed moving from lying on your back to sitting on the side of a flat bed without using bedrails?: A Little Help needed moving to and from a bed to a chair (including a wheelchair)?: Total Help needed standing up from a chair using your arms (e.g., wheelchair or bedside chair)?: Total Help needed to walk in hospital room?: Total Help needed climbing 3-5 steps with a railing? : Total 6 Click Score: 10    End of Session Equipment Utilized During Treatment: Gait belt Activity Tolerance: Patient limited by lethargy Patient left: in chair;with call bell/phone within reach;with chair alarm set Nurse Communication: Mobility status PT Visit Diagnosis: Unsteadiness on feet (R26.81);Muscle weakness (generalized) (M62.81);Difficulty in walking, not elsewhere classified (R26.2);Other symptoms and signs involving the nervous system (R29.898)    Time: 1410-1436 PT Time Calculation (min) (ACUTE ONLY): 26 min   Charges:   PT Evaluation $PT Eval Moderate Complexity: 1 Mod          Kelyn Ponciano A. Gilford Rile, PT, DPT Acute Rehabilitation  Services Pager [phone] Office [phone]   Linna Hoff 03/06/2021, 4:58 PM

## 2021-03-06 NOTE — Anesthesia Postprocedure Evaluation (Addendum)
Anesthesia Post Note  Patient: David [name]  Procedure(s) Performed: IR WITH ANESTHESIA     Patient location during evaluation: PACU Anesthesia Type: MAC Level of consciousness: awake and alert Pain management: pain level controlled Vital Signs Assessment: post-procedure vital signs reviewed and stable Respiratory status: spontaneous breathing, nonlabored ventilation, respiratory function stable and patient connected to nasal cannula oxygen Cardiovascular status: stable and blood pressure returned to baseline Postop Assessment: no apparent nausea or vomiting Anesthetic complications: no   No notable events documented.  Last Vitals:  Vitals:   03/06/21 0500 03/06/21 0600  BP: 136/66 128/63  Pulse: 74 73  Resp: (!) 23 (!) 22  Temp:    SpO2: 94% 93%    Last Pain:  Vitals:   03/06/21 0400  TempSrc: Oral  PainSc: 0-No pain                 [name] S

## 2021-03-06 NOTE — Progress Notes (Signed)
STROKE TEAM PROGRESS NOTE   INTERVAL HISTORY His wife and daughter are at the bedside, another daughter is on the facetime.  Pt reclining in bed, lethargic but awake, following commands and answered all questions appropriately. Still has left facial droop and left hand/arm weakness. BP stable at 150s. MRI showed right frontal cortical small infarcts but also left CR small infarct. Tele showed frequent PVCs, no afib.   OBJECTIVE Vitals:   03/06/21 0200 03/06/21 0300 03/06/21 0400 03/06/21 0500  BP: 134/70 128/78 124/61 136/66  Pulse: 75 74 73 74  Resp: 20 (!) 23 (!) 25 (!) 23  Temp:   (!) 97.4 F (36.3 C)   TempSrc:   Oral   SpO2: 92% 94% 94% 94%  Weight:        CBC:  Recent Labs  Lab 03/05/21 1419 03/05/21 1420  WBC  --  12.5*  NEUTROABS  --  9.0*  HGB 11.9* 11.8*  HCT 35.0* 35.7*  MCV  --  89.9  PLT  --  102    Basic Metabolic Panel:  Recent Labs  Lab 03/05/21 1419 03/05/21 1420  NA 139 136  K 3.6 3.5  CL 112* 110  CO2  --  18*  GLUCOSE 85 86  BUN 26* 28*  CREATININE 1.20 1.26*  CALCIUM  --  10.1    Lipid Panel:     Component Value Date/Time   CHOL 172 11/18/2020 0843   TRIG 99 11/18/2020 0843   HDL 48 11/18/2020 0843   CHOLHDL 3.6 11/18/2020 0843   CHOLHDL 3 10/04/2017 0829   VLDL 13.6 10/04/2017 0829   LDLCALC 106 (H) 11/18/2020 0843   HgbA1c:  Lab Results  Component Value Date   HGBA1C 6.3 (H) 03/06/2021   Urine Drug Screen: No results found for: LABOPIA, COCAINSCRNUR, LABBENZ, AMPHETMU, THCU, LABBARB  Alcohol Level No results found for: ETH  IMAGING  CT ANGIO HEAD NECK W WO CM  Result Date: 03/05/2021 CLINICAL DATA:  Provided history: Neuro deficit, acute, stroke suspected. Left-sided weakness. EXAM: CT ANGIOGRAPHY HEAD AND NECK TECHNIQUE: Multidetector CT imaging of the head and neck was performed using the standard protocol during bolus administration of intravenous contrast. Multiplanar CT image reconstructions and MIPs were obtained to  evaluate the vascular anatomy. Carotid stenosis measurements (when applicable) are obtained utilizing NASCET criteria, using the distal internal carotid diameter as the denominator. CONTRAST:  48mL OMNIPAQUE IOHEXOL 350 MG/ML SOLN COMPARISON:  Noncontrast head CT performed earlier today 03/05/2021. Thyroid ultrasound 02/27/2020. FINDINGS: CTA NECK FINDINGS Aortic arch: Standard aortic branching. Atherosclerotic plaque within the visualized aortic arch and proximal major branch vessels of the neck. Atherosclerotic plaque within the visualized aortic arch and proximal major branch vessels of the neck. No hemodynamically significant innominate or proximal subclavian artery stenosis. Right carotid system: CCA and ICA patent within the neck. Prominent irregular hypodensity within the proximal right ICA. A portion of this likely reflects soft plaque. However, the irregular portion is highly suspicious for superimposed thrombus/unstable plaque (for instance as seen on series 9, image 61). Resultant severe stenosis of the proximal ICA estimated to be 80% (series 7, image 327). Minimal calcified plaque is also present about the carotid bifurcation. Left carotid system: CCA and ICA patent within neck without stenosis. Minimal atherosclerotic plaque about the carotid bifurcation and within the proximal ICA. Vertebral arteries: Vertebral arteries patent within the neck. Nonstenotic plaque at the origin of both vessels. The left vertebral artery is dominant. Skeleton: Partially imaged thoracic dextrocurvature. Cervical and upper  thoracic spondylosis. Multilevel bridging ventral osteophytes within the imaged upper thoracic spine. No acute bony abnormality or aggressive osseous lesion. Other neck: No cervical lymphadenopathy. 3 cm exophytic nodule extending inferiorly from the right thyroid lobe in the right paratracheal region (for instance as seen on series 7, image 288). 8 mm nodule within the inferior right thyroid lobe. Upper  chest: No consolidation within the imaged lung apices. Review of the MIP images confirms the above findings CTA HEAD FINDINGS Anterior circulation: The intracranial internal carotid arteries are patent. Atherosclerotic plaque within both vessels. Moderate stenosis of the paraclinoid right ICA. Mild stenosis of the paraclinoid left ICA. The M1 middle cerebral arteries are patent. No M2 proximal branch occlusion is identified. Atherosclerotic irregularity of the M2 and more distal MCA vessels, bilaterally. Most notably, there is an apparent moderate/severe stenosis within superior division mid M2 right MCA vessel (series 12, image 17). The anterior cerebral arteries are patent. Hypoplastic left A1 segment. Atherosclerotic irregularity of both anterior cerebral arteries. Most notably, there is a moderate stenosis within the A4 left ACA. No intracranial aneurysm is identified. Posterior circulation: The intracranial vertebral arteries are patent. The basilar artery is patent. The posterior cerebral arteries are patent. Posterior communicating arteries are diminutive or absent bilaterally. Venous sinuses: Within the limitations of contrast timing, no convincing thrombus. Anatomic variants: As described. Review of the MIP images confirms the above findings CTA neck impression #1 and CTA head impressions #1 and #3 called by telephone at the time of interpretation on 03/05/2021 at 2:42 pm to provider Dr. Quinn Axe, who verbally acknowledged these results. IMPRESSION: CTA neck: 1. Prominent irregular hypodensity within the proximal right ICA. A portion of this hypodensity likely reflects soft plaque. However, the irregular portion is highly suspicious for superimposed thrombus (or unstable plaque). Resultant severe stenosis of the proximal right ICA estimated to be 80%. 2. The left common carotid, left internal carotid and bilateral vertebral arteries are patent within the neck without significant stenosis. Mild atherosclerotic  plaque within these vessels, as described. 3. 3 cm exophytic nodule extending inferiorly from the right thyroid lobe. This nodule was not described on the prior thyroid ultrasound of 02/27/2020. A repeat thyroid ultrasound is recommended to further characterize this nodule. 4.  Aortic Atherosclerosis (ICD10-I70.0). CTA head: 1. No intracranial large vessel occlusion is identified. 2. Intracranial atherosclerotic disease with multifocal stenoses, most notably as follows. 3. Moderate/severe stenosis within a superior division mid M2 right MCA vessel. 4. Moderate stenosis within the A4 left anterior cerebral artery. 5. Moderate stenosis of the paraclinoid right ICA. Electronically Signed   By: Kellie Simmering D.O.   On: 03/05/2021 15:10   MR BRAIN WO CONTRAST  Result Date: 03/05/2021 CLINICAL DATA:  Left-sided weakness EXAM: MRI HEAD WITHOUT CONTRAST TECHNIQUE: Multiplanar, multiecho pulse sequences of the brain and surrounding structures were obtained without intravenous contrast. COMPARISON:  None. FINDINGS: Brain: Multifocal acute cortical ischemia in the anterior right MCA territory. Small focus of acute/subacute ischemia at the left caudate head. No acute or chronic hemorrhage. There is multifocal hyperintense T2-weighted signal within the white matter. Parenchymal volume and CSF spaces are normal. The midline structures are normal. Vascular: Major flow voids are preserved. Skull and upper cervical spine: Normal calvarium and skull base. Visualized upper cervical spine and soft tissues are normal. Sinuses/Orbits:No paranasal sinus fluid levels or advanced mucosal thickening. No mastoid or middle ear effusion. Normal orbits. IMPRESSION: 1. Multifocal acute/early subacute cortical ischemia in the anterior right MCA territory. No hemorrhage or mass effect.  2. Small focus of acute/early subacute ischemia at the left caudate head. Electronically Signed   By: Ulyses Jarred M.D.   On: 03/05/2021 21:47   CT HEAD CODE  STROKE WO CONTRAST  Result Date: 03/05/2021 CLINICAL DATA:  Code stroke.  Left-sided weakness EXAM: CT HEAD WITHOUT CONTRAST TECHNIQUE: Contiguous axial images were obtained from the base of the skull through the vertex without intravenous contrast. COMPARISON:  None. FINDINGS: Brain: There is no evidence of acute intracranial hemorrhage, extra-axial fluid collection, or acute infarct. Parenchymal volume is normal. The ventricles are normal in size. There is no mass lesion. There is no midline shift. Vascular: No dense vessel is seen. Skull: Normal. Negative for fracture or focal lesion. Sinuses/Orbits: There is layering fluid in the maxillary and sphenoid sinuses. Bilateral lens implants are in place. The globes and orbits are otherwise unremarkable. Other: None. ASPECTS [facility] Stroke Program Early CT Score) - Ganglionic level infarction (caudate, lentiform nuclei, internal capsule, insula, M1-M3 cortex): Seven - Supraganglionic infarction (M4-M6 cortex): 3 Total score (0-10 with 10 being normal): 10 IMPRESSION: 1. No acute intracranial pathology. 2. ASPECTS is 10 3. Layering fluid in the maxillary and sphenoid sinuses which can be seen with acute sinusitis in the correct clinical setting. These results were paged via [employer] at the time of interpretation on 03/05/2021 at 2:29 pm to provider Dr Livia Snellen. Electronically Signed   By: Valetta Mole M.D.   On: 03/05/2021 14:32    ECG - SR rate 83 BPM. (Computer read as atrial fibrillation) (See cardiology reading for complete details)  PHYSICAL EXAM  Temp:  [97.4 F (36.3 C)-98.6 F (37 C)] 97.6 F (36.4 C) (01/07 0800) Pulse Rate:  [59-89] 77 (01/07 0800) Resp:  [15-36] 24 (01/07 0800) BP: (115-146)/(44-108) 140/68 (01/07 0800) SpO2:  [90 %-100 %] 94 % (01/07 0800) Weight:  [95.3 kg] 95.3 kg (01/06 1600)  General - Well nourished, well developed, in no apparent distress, lethargic.  Ophthalmologic - fundi not visualized due to  noncooperation.  Cardiovascular - Regular rhythm and rate, except with frequent PVCs on tele.  Neuro - awake, alert but lethargic, eyes open, orientated to age, place, time and people. No aphasia, fluent language but paucity of speech, following all simple commands. Able to name and repeat. No gaze palsy, tracking bilaterally, visual field full, PERRL. Right mild facial droop. Tongue midline. RUE 4/5 proximal and distal, LUE pronator drift proximally and distal finger grip and finger movement 2/5. Bilaterally LEs 3/5 proximal, and 5/5 distally. Sensation symmetrical bilaterally, b/l FTN intact although slow on the left proportional to the weakness, gait not tested.     ASSESSMENT/PLAN Mr. DEWITT JUDICE is a 81 y.o. male with history of coronary artery disease, hyperlipidemia, hypertension, remote history of prostate cancer, renal oncocytoma of right kidney in 2014 presenting with left sided weakness, dysarthria and visual neglect.  He did not receive TNKase due to unknown time of onset but found to have right ICA critical stenosis s/p Interventional Radiology with right CAS.  Stroke: Multifocal acute/early subacute cortical ischemia in the anterior right MCA territory, likely large vessel source from critical Rt ICA stenosis. Left caudate small infarct could be procedure related vs. Occult afib CT Head - No acute intracranial pathology. ASPECTS is 10.  CTA Neck - Prominent irregular hypodensity within the proximal right ICA. A portion of this hypodensity likely reflects soft plaque. However, the irregular portion is highly suspicious for superimposed thrombus (or unstable plaque). Resultant severe stenosis of the proximal right  ICA estimated to be 80%.  CTA Head - Moderate/severe stenosis within a superior division mid M2 right MCA vessel. Moderate stenosis within the A4 left anterior cerebral artery. Moderate stenosis of the paraclinoid right ICA. MRI head - Multifocal acute/early subacute  cortical ischemia in the anterior right MCA territory. No hemorrhage or mass effect. Small focus of acute/early subacute ischemia at the left caudate head. 2D Echo - pending Recommend 30 day cardiac event monitoring vs. Loop recorder to rule out afib [employer] Virus 2 - negative 02/26/21 LDL - 106 HgbA1c - 6.3 VTE prophylaxis - Lovenox 40 mg daily  aspirin 81 mg daily prior to admission, now on aspirin 81 mg daily and Brilinta (ticagrelor) 90 mg bid post stent Patient will be counseled to be compliant with his antithrombotic medications Ongoing aggressive stroke risk factor management Therapy recommendations:  pending Disposition:  Pending  R carotid stenosis CTA Neck - Prominent irregular hypodensity within the proximal right ICA. A portion of this hypodensity likely reflects soft plaque. However, the irregular portion is highly suspicious for superimposed thrombus (or unstable plaque). Resultant severe stenosis of the proximal right ICA estimated to be 80%.  Likely the cause of current stroke S/p emergent RICA stenting On ASA and brilinta  Flu Sinusitis  Flu A positive 02/26/21 CT head - Layering fluid in the maxillary and sphenoid sinuses which can be seen with acute sinusitis in the correct clinical setting. Wife stated that pt had significant coughing lately On mucinex and improving Will repeat respiratory panel  Frequent PVCs Frequent PVCs on tele MRI did show left CR small infarcts, not sure if related to procedure or cardioembolic Recommend 30 day cardiac event monitoring vs. Loop recorder to rule out afib Follows with Dr. Boris Lown as outpt  Hypertension Home BP meds: Benicar 40 mg daily Stable SBP 120-140 mmHg post IR 24h, now will relax BP today Long-term BP goal normotensive  Hyperlipidemia Home Lipid lowering medication: Lipitor 80 mg daily / Zetia 10 mg daily LDL 106, goal < 70 Current lipid lowering medication: Lipitor 80 mg daily / Zetia 10 mg  daily Continue statin at discharge  Other Stroke Risk Factors Advanced age Former cigarette smoker - quit ETOH use, advised to drink no more than 1 alcoholic beverage per day. Overweight, Body mass index is 28.49 kg/m., recommend weight loss, diet and exercise as appropriate  Coronary artery disease  Other Active Problems, Findings, Recommendations and/or Plan Code status - Full code CKD - stage 3a - creatinine - 1.20->1.26->0.99 Mild Leukocytosis - WBC's - 12.5 (afebrile) ->12.9 CTA Neck - 3 cm exophytic nodule extending inferiorly from the right thyroid lobe. This nodule was not described on the prior thyroid ultrasound of 02/27/2020. A repeat thyroid ultrasound is recommended to further characterize this nodule.  Aortic Atherosclerosis (ICD10-I70.0)   Hospital day # 1  This patient is critically ill due to acute stroke with right ICA high grade stenosis, s/p stenting, recent flu with significant coughing, frequent PVCs and at significant risk of neurological worsening, death form recurrent stroke, respiratory infection with failure, heart failure, hemorrhagic conversion. This patient's care requires constant monitoring of vital signs, hemodynamics, respiratory and cardiac monitoring, review of multiple databases, neurological assessment, discussion with family, other specialists and medical decision making of high complexity. I spent 45 minutes of neurocritical care time in the care of this patient. I had long discussion with wife and daughter at bedside, updated pt current condition, treatment plan and potential prognosis, and answered all the questions. They  expressed understanding and appreciation.   Rosalin Hawking, MD PhD Stroke Neurology 03/06/2021 11:22 AM      To contact Stroke Continuity provider, please refer to [email or web address] After hours, contact General Neurology

## 2021-03-06 NOTE — Progress Notes (Signed)
°  Transition of Care [facility]) Screening Note   Patient Details  Name: David [name] Date of Birth: 11-23-1940   Transition of Care [facility]) CM/SW Contact:    Alfredia Ferguson, LCSW Phone Number: 03/06/2021, 8:59 AM    Transition of Care Department [facility]) has reviewed patient and no TOC needs have been identified at this time. We will continue to monitor patient advancement through interdisciplinary progression rounds. If new patient transition needs arise, please place a TOC consult.

## 2021-03-06 NOTE — Progress Notes (Signed)
Referring Physician(s): Stroke team  Supervising Physician: [name]  Patient Status:  [name] - In-pt  Chief Complaint:  Code stroke, s/p right carotid stent placement with Dr. Karenann Cai on 03/05/21.   Subjective:  Patient laying in bed, family members at bedside.  Wife states that the patient is not doing well, he has persistent cough and just weak in general.  Pt seen by neurology today, persistent left facial droop and left hand/arm weakness noted.  Patient in frequent PVC, family members said his cardiologist came by today and said patient is scheduled for echocardiogram.    Allergies: Codeine, Ace inhibitors, Amlodipine, Hydrocodone, and Percocet [oxycodone-acetaminophen]  Medications: Prior to Admission medications   Medication Sig Start Date End Date Taking? Authorizing Provider  acetaminophen (TYLENOL) 500 MG tablet Take 500-1,000 mg by mouth every 6 (six) hours as needed for moderate pain.   Yes [provider]  albuterol (VENTOLIN HFA) 108 (90 Base) MCG/ACT inhaler Inhale 2 puffs into the lungs every 6 (six) hours as needed for wheezing or shortness of breath (Cough). 02/23/21  Yes Lynden Oxford Scales, PA-C  aspirin EC 81 MG tablet Take 1 tablet (81 mg total) by mouth daily. 10/24/20 10/24/21 Yes Plotnikov, [name], MD  atorvastatin (LIPITOR) 80 MG tablet TAKE 1 TABLET(80 MG) BY MOUTH DAILY AT 6 PM 12/03/20  Yes Crenshaw, Denice Bors, MD  cetirizine (ZYRTEC) 10 MG tablet Take 1 tablet (10 mg total) by mouth daily. 02/23/21  Yes Lynden Oxford Scales, PA-C  DULoxetine (CYMBALTA) 30 MG capsule Take 30 mg by mouth daily.   Yes [provider]  ezetimibe (ZETIA) 10 MG tablet Take 10 mg by mouth daily.   Yes [provider]  fluticasone (FLONASE) 50 MCG/ACT nasal spray Place 2 sprays into both nostrils daily. 02/23/21  Yes Lynden Oxford Scales, PA-C  olmesartan (BENICAR) 40 MG tablet Take 1 tablet (40 mg total) by mouth  daily. 08/13/20  Yes Lelon Perla, MD  pantoprazole (PROTONIX) 40 MG tablet TAKE 1 TABLET BY MOUTH TWICE DAILY 11/10/20  Yes Plotnikov, [name], MD  Vitamin D, Cholecalciferol, 50 MCG (2000 UT) CAPS Take 2,000 Units by mouth daily.    Yes [provider]  benzonatate (TESSALON) 200 MG capsule Take 1 capsule (200 mg total) by mouth 3 (three) times daily as needed for cough. Patient not taking: Reported on 03/05/2021 02/25/21   Plotnikov, [name], MD  chlorpheniramine-HYDROcodone (TUSSIONEX PENNKINETIC ER) 10-8 MG/5ML SUER Take 5 ml po bid x 1 week, then 2.5 ml bid x 1 week, then taper off and d/c Patient not taking: Reported on 03/05/2021 02/25/21   Plotnikov, [name], MD  ezetimibe (ZETIA) 10 MG tablet Take 1 tablet (10 mg total) by mouth daily. 11/30/20 02/28/21  Lelon Perla, MD  ipratropium (ATROVENT) 0.06 % nasal spray Place 2 sprays into both nostrils 4 (four) times daily. As needed for nasal congestion, runny nose Patient not taking: Reported on 03/05/2021 02/23/21   Lynden Oxford Scales, PA-C  oseltamivir (TAMIFLU) 75 MG capsule Take 1 capsule (75 mg total) by mouth every 12 (twelve) hours. Patient not taking: Reported on 03/05/2021 02/26/21   Fredia Sorrow, MD     Vital Signs: BP (!) 143/66 (BP Location: Right Arm)    Pulse 79    Temp 97.8 F (36.6 C) (Oral)    Resp 20    Wt 210 lb 1.6 oz (95.3 kg)    SpO2 94%    BMI 28.49  kg/m   Physical Exam Vitals reviewed.  Constitutional:      General: He is not in acute distress.    Appearance: He is not ill-appearing.     Comments: Appears to be lethargic   HENT:     Head: Normocephalic and atraumatic.  Cardiovascular:     Comments: DP 2+ bilaterally  Pulmonary:     Effort: Pulmonary effort is normal.  Skin:    General: Skin is warm and dry.     Coloration: Skin is not jaundiced or pale.     Comments: Positive dressing on R CFA puncture site. Site is unremarkable with no erythema, edema, tenderness, bleeding or  drainage. Minimal amount of old, dry blood noted on the dressing. Dressing otherwise clean, dry, and intact.    Neurological:     Mental Status: He is alert.    Imaging: CT ANGIO HEAD NECK W WO CM  Result Date: 03/05/2021 CLINICAL DATA:  Provided history: Neuro deficit, acute, stroke suspected. Left-sided weakness. EXAM: CT ANGIOGRAPHY HEAD AND NECK TECHNIQUE: Multidetector CT imaging of the head and neck was performed using the standard protocol during bolus administration of intravenous contrast. Multiplanar CT image reconstructions and MIPs were obtained to evaluate the vascular anatomy. Carotid stenosis measurements (when applicable) are obtained utilizing NASCET criteria, using the distal internal carotid diameter as the denominator. CONTRAST:  45mL OMNIPAQUE IOHEXOL 350 MG/ML SOLN COMPARISON:  Noncontrast head CT performed earlier today 03/05/2021. Thyroid ultrasound 02/27/2020. FINDINGS: CTA NECK FINDINGS Aortic arch: Standard aortic branching. Atherosclerotic plaque within the visualized aortic arch and proximal major branch vessels of the neck. Atherosclerotic plaque within the visualized aortic arch and proximal major branch vessels of the neck. No hemodynamically significant innominate or proximal subclavian artery stenosis. Right carotid system: CCA and ICA patent within the neck. Prominent irregular hypodensity within the proximal right ICA. A portion of this likely reflects soft plaque. However, the irregular portion is highly suspicious for superimposed thrombus/unstable plaque (for instance as seen on series 9, image 61). Resultant severe stenosis of the proximal ICA estimated to be 80% (series 7, image 327). Minimal calcified plaque is also present about the carotid bifurcation. Left carotid system: CCA and ICA patent within neck without stenosis. Minimal atherosclerotic plaque about the carotid bifurcation and within the proximal ICA. Vertebral arteries: Vertebral arteries patent within the  neck. Nonstenotic plaque at the origin of both vessels. The left vertebral artery is dominant. Skeleton: Partially imaged thoracic dextrocurvature. Cervical and upper thoracic spondylosis. Multilevel bridging ventral osteophytes within the imaged upper thoracic spine. No acute bony abnormality or aggressive osseous lesion. Other neck: No cervical lymphadenopathy. 3 cm exophytic nodule extending inferiorly from the right thyroid lobe in the right paratracheal region (for instance as seen on series 7, image 288). 8 mm nodule within the inferior right thyroid lobe. Upper chest: No consolidation within the imaged lung apices. Review of the MIP images confirms the above findings CTA HEAD FINDINGS Anterior circulation: The intracranial internal carotid arteries are patent. Atherosclerotic plaque within both vessels. Moderate stenosis of the paraclinoid right ICA. Mild stenosis of the paraclinoid left ICA. The M1 middle cerebral arteries are patent. No M2 proximal branch occlusion is identified. Atherosclerotic irregularity of the M2 and more distal MCA vessels, bilaterally. Most notably, there is an apparent moderate/severe stenosis within superior division mid M2 right MCA vessel (series 12, image 17). The anterior cerebral arteries are patent. Hypoplastic left A1 segment. Atherosclerotic irregularity of both anterior cerebral arteries. Most notably, there is a moderate  stenosis within the A4 left ACA. No intracranial aneurysm is identified. Posterior circulation: The intracranial vertebral arteries are patent. The basilar artery is patent. The posterior cerebral arteries are patent. Posterior communicating arteries are diminutive or absent bilaterally. Venous sinuses: Within the limitations of contrast timing, no convincing thrombus. Anatomic variants: As described. Review of the MIP images confirms the above findings CTA neck impression #1 and CTA head impressions #1 and #3 called by telephone at the time of  interpretation on 03/05/2021 at 2:42 pm to provider Dr. Quinn Axe, who verbally acknowledged these results. IMPRESSION: CTA neck: 1. Prominent irregular hypodensity within the proximal right ICA. A portion of this hypodensity likely reflects soft plaque. However, the irregular portion is highly suspicious for superimposed thrombus (or unstable plaque). Resultant severe stenosis of the proximal right ICA estimated to be 80%. 2. The left common carotid, left internal carotid and bilateral vertebral arteries are patent within the neck without significant stenosis. Mild atherosclerotic plaque within these vessels, as described. 3. 3 cm exophytic nodule extending inferiorly from the right thyroid lobe. This nodule was not described on the prior thyroid ultrasound of 02/27/2020. A repeat thyroid ultrasound is recommended to further characterize this nodule. 4.  Aortic Atherosclerosis (ICD10-I70.0). CTA head: 1. No intracranial large vessel occlusion is identified. 2. Intracranial atherosclerotic disease with multifocal stenoses, most notably as follows. 3. Moderate/severe stenosis within a superior division mid M2 right MCA vessel. 4. Moderate stenosis within the A4 left anterior cerebral artery. 5. Moderate stenosis of the paraclinoid right ICA. Electronically Signed   By: Kellie Simmering D.O.   On: 03/05/2021 15:10   MR BRAIN WO CONTRAST  Result Date: 03/05/2021 CLINICAL DATA:  Left-sided weakness EXAM: MRI HEAD WITHOUT CONTRAST TECHNIQUE: Multiplanar, multiecho pulse sequences of the brain and surrounding structures were obtained without intravenous contrast. COMPARISON:  None. FINDINGS: Brain: Multifocal acute cortical ischemia in the anterior right MCA territory. Small focus of acute/subacute ischemia at the left caudate head. No acute or chronic hemorrhage. There is multifocal hyperintense T2-weighted signal within the white matter. Parenchymal volume and CSF spaces are normal. The midline structures are normal. Vascular:  Major flow voids are preserved. Skull and upper cervical spine: Normal calvarium and skull base. Visualized upper cervical spine and soft tissues are normal. Sinuses/Orbits:No paranasal sinus fluid levels or advanced mucosal thickening. No mastoid or middle ear effusion. Normal orbits. IMPRESSION: 1. Multifocal acute/early subacute cortical ischemia in the anterior right MCA territory. No hemorrhage or mass effect. 2. Small focus of acute/early subacute ischemia at the left caudate head. Electronically Signed   By: Ulyses Jarred M.D.   On: 03/05/2021 21:47   ECHOCARDIOGRAM COMPLETE  Result Date: 03/06/2021    ECHOCARDIOGRAM REPORT   Patient Name:   David [name] Date of Exam: 03/06/2021 Medical Rec #:  [id number]         Height:       72.0 in Accession #:    [id number]        Weight:       210.1 lb Date of Birth:  1940-08-01          BSA:          2.176 m Patient Age:    66 years          BP:           143/66 mmHg Patient Gender: M                 HR:  93 bpm. Exam Location:  Inpatient Procedure: 2D Echo, Cardiac Doppler, Color Doppler and Intracardiac            Opacification Agent Indications:    Stroke  History:        Patient has prior history of Echocardiogram examinations, most                 recent 06/13/2019.  Sonographer:    Clayton Lefort RDCS (AE) Referring Phys: [id number] Derek Jack  Sonographer Comments: Technically difficult study due to poor echo windows and no subcostal window. Image acquisition challenging due to respiratory motion. Patient coughing throughout test. IMPRESSIONS  1. Left ventricular ejection fraction, by estimation, is 65 to 70%. The left ventricle has normal function. The left ventricle has no regional wall motion abnormalities. There is moderate left ventricular hypertrophy. Left ventricular diastolic parameters are indeterminate.  2. Right ventricular systolic function is normal. The right ventricular size is normal.  3. The mitral valve is normal in structure. No  evidence of mitral valve regurgitation. No evidence of mitral stenosis.  4. The aortic valve is normal in structure. Aortic valve regurgitation is not visualized. No aortic stenosis is present.  5. The inferior vena cava is normal in size with greater than 50% respiratory variability, suggesting right atrial pressure of 3 mmHg. Comparison(s): Prior ascending aorta 42-43 mm. Unable to visual in current study. Conclusion(s)/Recommendation(s): No intracardiac source of embolism detected on this transthoracic study. Consider a transesophageal echocardiogram to exclude cardiac source of embolism if clinically indicated. FINDINGS  Left Ventricle: Left ventricular ejection fraction, by estimation, is 65 to 70%. The left ventricle has normal function. The left ventricle has no regional wall motion abnormalities. The left ventricular internal cavity size was normal in size. There is  moderate left ventricular hypertrophy. Left ventricular diastolic parameters are indeterminate. Right Ventricle: The right ventricular size is normal. No increase in right ventricular wall thickness. Right ventricular systolic function is normal. Left Atrium: Left atrial size was normal in size. Right Atrium: Right atrial size was normal in size. Pericardium: There is no evidence of pericardial effusion. Mitral Valve: The mitral valve is normal in structure. No evidence of mitral valve regurgitation. No evidence of mitral valve stenosis. Tricuspid Valve: The tricuspid valve is normal in structure. Tricuspid valve regurgitation is not demonstrated. No evidence of tricuspid stenosis. Aortic Valve: The aortic valve is normal in structure. Aortic valve regurgitation is not visualized. No aortic stenosis is present. Aortic valve mean gradient measures 7.0 mmHg. Aortic valve peak gradient measures 14.1 mmHg. Aortic valve area, by VTI measures 3.49 cm. Pulmonic Valve: The pulmonic valve was normal in structure. Pulmonic valve regurgitation is not  visualized. No evidence of pulmonic stenosis. Aorta: The aortic root is normal in size and structure. Venous: The inferior vena cava is normal in size with greater than 50% respiratory variability, suggesting right atrial pressure of 3 mmHg. IAS/Shunts: No atrial level shunt detected by color flow Doppler.  LEFT VENTRICLE PLAX 2D LVIDd:         4.70 cm LVIDs:         2.90 cm LV PW:         1.60 cm LV IVS:        1.80 cm LVOT diam:     2.30 cm LV SV:         108 LV SV Index:   49 LVOT Area:     4.15 cm  RIGHT VENTRICLE RV S prime:  17.40 cm/s TAPSE (M-mode): 2.4 cm LEFT ATRIUM           Index LA diam:      1.50 cm 0.69 cm/m LA Vol (A2C): 36.2 ml 16.64 ml/m LA Vol (A4C): 35.1 ml 16.13 ml/m  AORTIC VALVE AV Area (Vmax):    3.14 cm AV Area (Vmean):   3.59 cm AV Area (VTI):     3.49 cm AV Vmax:           188.00 cm/s AV Vmean:          126.000 cm/s AV VTI:            0.308 m AV Peak Grad:      14.1 mmHg AV Mean Grad:      7.0 mmHg LVOT Vmax:         142.00 cm/s LVOT Vmean:        109.000 cm/s LVOT VTI:          0.259 m LVOT/AV VTI ratio: 0.84  AORTA Ao Asc diam: 3.70 cm MITRAL VALVE MV Area (PHT): 2.87 cm     SHUNTS MV Decel Time: 264 msec     Systemic VTI:  0.26 m MV E velocity: 92.40 cm/s   Systemic Diam: 2.30 cm MV A velocity: 111.00 cm/s MV E/A ratio:  0.83 Candee Furbish MD Electronically signed by Candee Furbish MD Signature Date/Time: 03/06/2021/2:18:39 PM    Final    CT HEAD CODE STROKE WO CONTRAST  Result Date: 03/05/2021 CLINICAL DATA:  Code stroke.  Left-sided weakness EXAM: CT HEAD WITHOUT CONTRAST TECHNIQUE: Contiguous axial images were obtained from the base of the skull through the vertex without intravenous contrast. COMPARISON:  None. FINDINGS: Brain: There is no evidence of acute intracranial hemorrhage, extra-axial fluid collection, or acute infarct. Parenchymal volume is normal. The ventricles are normal in size. There is no mass lesion. There is no midline shift. Vascular: No dense vessel is  seen. Skull: Normal. Negative for fracture or focal lesion. Sinuses/Orbits: There is layering fluid in the maxillary and sphenoid sinuses. Bilateral lens implants are in place. The globes and orbits are otherwise unremarkable. Other: None. ASPECTS [facility] Stroke Program Early CT Score) - Ganglionic level infarction (caudate, lentiform nuclei, internal capsule, insula, M1-M3 cortex): Seven - Supraganglionic infarction (M4-M6 cortex): 3 Total score (0-10 with 10 being normal): 10 IMPRESSION: 1. No acute intracranial pathology. 2. ASPECTS is 10 3. Layering fluid in the maxillary and sphenoid sinuses which can be seen with acute sinusitis in the correct clinical setting. These results were paged via AMION at the time of interpretation on 03/05/2021 at 2:29 pm to provider Dr Livia Snellen. Electronically Signed   By: Valetta Mole M.D.   On: 03/05/2021 14:32    Labs:  CBC: Recent Labs    05/27/20 0332 02/26/21 1308 03/05/21 1419 03/05/21 1420 03/06/21 0515  WBC 12.5* 12.5*  --  12.5* 12.9*  HGB 12.1* 12.9* 11.9* 11.8* 10.7*  HCT 37.0* 41.2 35.0* 35.7* 32.2*  PLT 184 194  --  208 191    COAGS: Recent Labs    05/19/20 0942 02/26/21 1308 03/05/21 1420  INR 1.1* 1.1 1.2  APTT  --   --  30    BMP: Recent Labs    05/27/20 0332 08/20/20 1207 02/26/21 1308 03/05/21 1419 03/05/21 1420 03/06/21 0515  NA 136 143 137 139 136 136  K 4.4 4.8 4.2 3.6 3.5 3.7  CL 109 108* 106 112* 110 112*  CO2 22 22 22   --  18* 18*  GLUCOSE 219* 102* 96 85 86 98  BUN 25* 22 28* 26* 28* 21  CALCIUM 9.8 10.6* 10.5*  --  10.1 9.8  CREATININE 1.07 1.20 1.29* 1.20 1.26* 0.99  GFRNONAA >60  --  56*  --  58* >60    LIVER FUNCTION TESTS: Recent Labs    11/18/20 0843 02/26/21 1308 03/05/21 1420 03/06/21 0515  BILITOT 0.5 0.7 1.1 1.1  AST 23 42* 52* 44*  ALT 17 38 52* 44  ALKPHOS 135* 102 111 98  PROT 6.3 7.3 6.5 5.9*  ALBUMIN 4.2 4.0 2.7* 2.4*    Assessment and Plan:  81 y.o. male Code Stroke,  s/p right  carotid artery stent placement by Dr. Karenann Cai on 03/05/21.    MRI yesterday showed:  1. Multifocal acute/early subacute cortical ischemia in the anterior right MCA territory. No hemorrhage or mass effect. 2. Small focus of acute/early subacute ischemia at the left caudate head.  Patient is under the care of the Stroke team.  NIR recommends DAPT with ASA 81 mg and Brilinta 90 mg BID.   Further treatment plan per Stroke team.  Appreciate and agree with the plan.  NIR to follow.    Electronically Signed: Tera Mater, PA-C 03/06/2021, 2:33 PM   I spent a total of 25 Minutes at the the patient's bedside AND on the patient's hospital floor or unit, greater than 50% of which was counseling/coordinating care for right carotid artery stent placement.   This chart was dictated using voice recognition software.  Despite best efforts to proofread,  errors can occur which can change the documentation meaning.

## 2021-03-06 NOTE — Plan of Care (Signed)
°  Problem: Education: Goal: Knowledge of disease or condition will improve Outcome: Progressing Goal: Knowledge of secondary prevention will improve (SELECT ALL) Outcome: Progressing   Problem: Self-Care: Goal: Ability to participate in self-care as condition permits will improve Outcome: Progressing

## 2021-03-07 ENCOUNTER — Inpatient Hospital Stay (HOSPITAL_COMMUNITY): Payer: Medicare Other

## 2021-03-07 ENCOUNTER — Encounter (HOSPITAL_COMMUNITY): Payer: Self-pay | Admitting: Radiology

## 2021-03-07 DIAGNOSIS — J189 Pneumonia, unspecified organism: Secondary | ICD-10-CM

## 2021-03-07 DIAGNOSIS — E041 Nontoxic single thyroid nodule: Secondary | ICD-10-CM

## 2021-03-07 DIAGNOSIS — R069 Unspecified abnormalities of breathing: Secondary | ICD-10-CM

## 2021-03-07 LAB — RESPIRATORY PANEL BY PCR

## 2021-03-07 LAB — BASIC METABOLIC PANEL
Anion gap: 8 (ref 5–15)
BUN: 22 mg/dL (ref 8–23)
CO2: 18 mmol/L — ABNORMAL LOW (ref 22–32)
Calcium: 9.5 mg/dL (ref 8.9–10.3)
Chloride: 113 mmol/L — ABNORMAL HIGH (ref 98–111)
Creatinine, Ser: 1 mg/dL (ref 0.61–1.24)
GFR, Estimated: >60 mL/min (ref >60)
Glucose, Bld: 125 mg/dL — ABNORMAL HIGH (ref 70–99)
Potassium: 3.6 mmol/L (ref 3.5–5.1)
Sodium: 139 mmol/L (ref 135–145)

## 2021-03-07 LAB — CBC
HCT: 32.4 % — ABNORMAL LOW (ref 39.0–52.0)
Hemoglobin: 10.8 g/dL — ABNORMAL LOW (ref 13.0–17.0)
MCH: 29.4 pg (ref 26.0–34.0)
MCHC: 33.3 g/dL (ref 30.0–36.0)
MCV: 88.3 fL (ref 80.0–100.0)
Platelets: 226 10*3/uL (ref 150–400)
RBC: 3.67 MIL/uL — ABNORMAL LOW (ref 4.22–5.81)
RDW: 14.6 % (ref 11.5–15.5)
WBC: 19.2 10*3/uL — ABNORMAL HIGH (ref 4.0–10.5)
nRBC: 0 % (ref 0.0–0.2)

## 2021-03-07 LAB — STREP PNEUMONIAE URINARY ANTIGEN: Strep Pneumo Urinary Antigen: NEGATIVE

## 2021-03-07 MED ORDER — FUROSEMIDE 10 MG/ML IJ SOLN
40.0000 mg | Freq: Once | INTRAMUSCULAR | Status: AC
  Administered 2021-03-07: 40 mg via INTRAVENOUS
  Filled 2021-03-07: qty 4

## 2021-03-07 MED ORDER — SODIUM CHLORIDE 0.9 % IV SOLN
3.0000 g | Freq: Four times a day (QID) | INTRAVENOUS | Status: DC
  Administered 2021-03-07 – 2021-03-09 (×8): 3 g via INTRAVENOUS
  Filled 2021-03-07 (×9): qty 8

## 2021-03-07 MED ORDER — DEXTROMETHORPHAN POLISTIREX ER 30 MG/5ML PO SUER
30.0000 mg | Freq: Three times a day (TID) | ORAL | Status: DC
  Administered 2021-03-07 – 2021-03-09 (×7): 30 mg via ORAL
  Filled 2021-03-07 (×10): qty 5

## 2021-03-07 MED ORDER — GUAIFENESIN ER 600 MG PO TB12
600.0000 mg | ORAL_TABLET | Freq: Two times a day (BID) | ORAL | Status: DC
  Administered 2021-03-07 – 2021-03-09 (×4): 600 mg via ORAL
  Filled 2021-03-07 (×4): qty 1

## 2021-03-07 NOTE — Progress Notes (Signed)
Inpatient Rehab Admissions Coordinator:  ? ?Per therapy recommendations,  patient was screened for CIR candidacy by Adaleigh Warf, MS, CCC-SLP. At this time, Pt. Appears to be a a potential candidate for CIR. I will place   order for rehab consult per protocol for full assessment. Please contact me any with questions. ? ?Kailena Lubas, MS, CCC-SLP ?Rehab Admissions Coordinator  ?[phone] (celll) ?[phone] (office) ? ?

## 2021-03-07 NOTE — Assessment & Plan Note (Addendum)
-   s/p Flu on 12/30 for which he completed Tamiflu and steroid course; also completed had 10 day course Augmentin filled 12/20 - he does have dysarthria on my exam and on SLP exam today; I cannot tell how he did on his swallow eval? But my concern is for aspiration as well given CXR appearance, his recent stroke and decreased mentation; but also could just have superimposed bacterial infection s/p viral infection (e.g. flu) - start on Unasyn to cover for aspiration pna - pending clinical response, may still need repeat steroid course; needs a walk test to see if truly hypoxic also  - check and trend procalcitonin  - check RVP to rule out other etiologies - check Strep pna and Legionella Urinary Ag - avoid codeine products per family request; add on Delsym to see if helps any; continue mucinex and tessalon  - d/c IVF; give dose of lasix once now  - trend CBC w/ diff

## 2021-03-07 NOTE — Progress Notes (Addendum)
Speech Language Pathology Treatment: Cognitive-Linquistic (Dysarthria)  Patient Details Name: David [name] MRN: [id number] DOB: 05-Feb-1941 Today's Date: 03/07/2021 Time: 4098-1191 SLP Time Calculation (min) (ACUTE ONLY): 17 min  Assessment / Plan / Recommendation Clinical Impression  Pt was seen for dysarthria treatment and he was cooperative during the session. He was educated regarding the nature of dysarthria, and compensatory strategies to improve speech intelligibility. Pt verbalized understanding regarding all areas of education. He used compensatory strategies at the word level with 80% accuracy increasing to 100% accuracy with cues for overarticulation. At the phrase level he demonstrated 60% accuracy increasing to 80% accuracy with verbal prompts for vocal intensity and overarticulation. Some generalization was noted to conversation at the end of the session, but repetition was still frequently necessary to clarification. SLP will continue to follow pt.    HPI HPI: Pt is a 81 y/o male presented to ED on 03/05/21 for L sided weakness. Recent flue dx. CTA showed R ICA occlusion. S/p cerebral angiogram and R carotid stenting on 1/6. MRI showed multifocal acute/early subacute cortical ischemia in anterior R MCA and small focus of acute/early subacute ischemia at L caudate head. PMH: HTN, CAD, prostate cancer      SLP Plan     Patient needs continued Speech Lanaguage Pathology Services   Recommendations for follow up therapy are one component of a multi-disciplinary discharge planning process, led by the attending physician.  Recommendations may be updated based on patient status, additional functional criteria and insurance authorization.    Recommendations                   Follow Up Recommendations: Acute inpatient rehab (3hours/day) Assistance recommended at discharge: Intermittent Supervision/Assistance SLP Visit Diagnosis: Cognitive communication deficit (R41.841);Dysarthria  and anarthria (R47.1)          Marlee Trentman I. Hardin Negus, [place], [place] Office number [phone] Pager [place]  03/07/2021, 3:29 PM

## 2021-03-07 NOTE — Consult Note (Signed)
Initial Consultation Note   Patient: David [name] WJX:[id number] DOB: Apr 05, 1940 PCP: Cassandria Anger, MD DOA: 03/05/2021 DOS: the patient was seen and examined on 03/07/2021 Primary service: Stroke, Md, MD  Referring physician: Dr. Erlinda Hong Reason for consult: cough and worsening leukocytosis   HPI:  David [name] is a 81 yo male with PMH CAD, HTN, HLD, GERD, remote prostate cancer, renal oncocytoma R kidney 2014 who presented originally on 03/05/21 as a code stroke with left side weakness.  Work-up revealed severe stenosis involving right carotid bulb and he underwent stenting and IR and was started on aspirin and Brilinta. He has also been having a persistent cough for approximately 2 to 3 weeks and had even completed outpatient course of antibiotics and steroids around [place].  He also tested positive for influenza A on 02/26/2021 and completed Tamiflu course for this also.  Despite this, his cough has persisted and his wife has also had a severe lingering cough being treated with antibiotics at home as well.  He does not tolerate codeine derived products such as Tussionex and declines use of this for symptomatic treatment. Repeat COVID and flu swabs were performed on 03/06/2021 and were negative.  CXR performed on 03/07/2021 shows bilateral infiltrates concerning for multifocal pneumonia.  Does appear to also have some underlying vascular congestion, fluids have also been running since 03/05/2021. Lab work-up notable for increasing leukocytosis, 19.2 today up from 12.9 yesterday. No fevers noted.  He is on 2 L oxygen for symptom control however is not truly oxygen dependent. Due to his ongoing cough and uptrending leukocytosis, we were asked to consult for further evaluation and recommendations.   Assessment/Plan Pneumonia - s/p Flu on 12/30 for which he completed Tamiflu and steroid course; also completed had 10 day course Augmentin filled 12/20 - he does have dysarthria on my exam and on SLP  exam today; I cannot tell how he did on his swallow eval? But my concern is for aspiration as well given CXR appearance, his recent stroke and decreased mentation; but also could just have superimposed bacterial infection s/p viral infection (e.g. flu) - start on Unasyn to cover for aspiration pna - pending clinical response, may still need repeat steroid course; needs a walk test to see if truly hypoxic also  - check and trend procalcitonin  - check RVP to rule out other etiologies - check Strep pna and Legionella Urinary Ag - avoid codeine products per family request; add on Delsym to see if helps any; continue mucinex and tessalon  - d/c IVF; give dose of lasix once now  - trend CBC w/ diff    TRH will continue to follow the patient.   Review of Systems:  Review of Systems  Constitutional:  Positive for malaise/fatigue.  HENT:  Positive for congestion.   Eyes: Negative.   Respiratory:  Positive for cough and shortness of breath.   Cardiovascular: Negative.   Gastrointestinal: Negative.   Genitourinary: Negative.   Musculoskeletal: Negative.   Skin: Negative.   Neurological:  Positive for weakness.  Endo/Heme/Allergies: Negative.   Psychiatric/Behavioral: Negative.     Past Medical History:  Diagnosis Date   Allergy    Arthritis    gen.  and left hip   BPH (benign prostatic hypertrophy)    Coronary artery disease    GERD (gastroesophageal reflux disease)    occasional   History of kidney stones    2007   Hyperlipidemia    Hypertension    Kidney tumor  LBP (low back pain) 06/2005   L5 radicular symptoms; MRI of LS spine severe spondylosis at L4-L5 with central cancal stenosis    Nocturia    Organic impotence    Prostate cancer ([place]) 10/2011   (Low grade) [facility] - Dr. Junious Silk   Pyelonephritis 03/05/2013   Renal oncocytoma of right kidney 01/23/2013   As 3.2 x 3.5 cm enhancing exophytic mass projecting off the lower pole. This is consistent with a renal cell  carcinoma. No retroperitoneal lymphadenopathy.    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   Wears glasses    Past Surgical History:  Procedure Laterality Date   COLONOSCOPY  02/15/2006, 2014   Internal hemorrhoids (Dr [name]), last in 2014   [place] Bilateral 02/15/2013   Procedure: LYMPHADENECTOMY WITH INDOCYANINE GREEN DYE INJECTION;  Surgeon: [name], MD;  Location: [facility];  Service: Urology;  Laterality: Bilateral;   PROSTATE BIOPSY N/A 12/18/2012   Procedure: BIOPSY TRANSRECTAL ULTRASONIC PROSTATE (TUBP);  Surgeon: [name], MD;  Location: [facility];  Service: Urology;  Laterality: N/A;   PROSTATE BIOPSY  12/05/11   gleason 6, 3/12 cores   REPAIR UMBILICAL AND VENTRAL HERNIA'S W/ MESH  08-21-2009   RIGHT/LEFT HEART CATH AND CORONARY ANGIOGRAPHY N/A 08/06/2019   Procedure: RIGHT/LEFT HEART CATH AND CORONARY ANGIOGRAPHY;  Surgeon: [name], Peter M, MD;  Location: [facility];  Service: Cardiovascular;  Laterality: N/A;   ROBOT ASSISTED LAPAROSCOPIC RADICAL PROSTATECTOMY N/A 02/15/2013   Procedure: ROBOTIC ASSISTED LAPAROSCOPIC RADICAL PROSTATECTOMY;  Surgeon: [name], MD;  Location: [facility];  Service: Urology;  Laterality: N/A;   ROBOTIC ASSITED PARTIAL NEPHRECTOMY Right 02/15/2013   Procedure: ROBOTIC ASSITED PARTIAL NEPHRECTOMY;  Surgeon: [name], MD;  Location: [facility];  Service: Urology;  Laterality: Right;   SPERMATOCELECTOMY Left 12/18/2012   Procedure: LEFT SPERMATOCELECTOMY;  Surgeon: [name], MD;  Location: [facility];  Service: Urology;  Laterality: Left;   TONSILLECTOMY AND ADENOIDECTOMY  as child   TOTAL KNEE ARTHROPLASTY Left 05/26/2020   Procedure: TOTAL KNEE ARTHROPLASTY;  Surgeon: Paralee Cancel, MD;  Location: [facility];  Service: Orthopedics;  Laterality: Left;  70 mins   TRANSURETHRAL RESECTION OF PROSTATE  AGE 30   Social History:  reports that  he quit smoking about 40 years ago. His smoking use included cigarettes. He has a 2.50 pack-year smoking history. He has never used smokeless tobacco. He reports current alcohol use of about 1.0 standard drink per week. He reports that he does not currently use drugs after having used the following drugs: Flunitrazepam.  Allergies  Allergen Reactions   Codeine Itching    Severe hiccups   Ace Inhibitors Other (See Comments)    REACTION: Cough   Amlodipine     LE swelling   Hydrocodone Hives and Itching    Hiccoughs   Percocet [Oxycodone-Acetaminophen]     Severe hiccups    Family History  Problem Relation Age of Onset   Cancer Mother        colon   Colon cancer Mother 35   Pulmonary fibrosis Father        father owned a Medical sales representative business (father blamed inhalation of chemicals and fumes)   Pulmonary fibrosis Brother    Coronary artery disease Other    Hyperlipidemia Other    Colon polyps Neg Hx    Esophageal cancer Neg Hx    Rectal cancer Neg Hx  Stomach cancer Neg Hx     Prior to Admission medications   Medication Sig Start Date End Date Taking? Authorizing Provider  acetaminophen (TYLENOL) 500 MG tablet Take 500-1,000 mg by mouth every 6 (six) hours as needed for moderate pain.   Yes [provider]  albuterol (VENTOLIN HFA) 108 (90 Base) MCG/ACT inhaler Inhale 2 puffs into the lungs every 6 (six) hours as needed for wheezing or shortness of breath (Cough). 02/23/21  Yes Lynden Oxford Scales, PA-C  aspirin EC 81 MG tablet Take 1 tablet (81 mg total) by mouth daily. 10/24/20 10/24/21 Yes Plotnikov, [name], MD  atorvastatin (LIPITOR) 80 MG tablet TAKE 1 TABLET(80 MG) BY MOUTH DAILY AT 6 PM 12/03/20  Yes Crenshaw, Denice Bors, MD  cetirizine (ZYRTEC) 10 MG tablet Take 1 tablet (10 mg total) by mouth daily. 02/23/21  Yes Lynden Oxford Scales, PA-C  DULoxetine (CYMBALTA) 30 MG capsule Take 30 mg by mouth daily.   Yes [provider]  ezetimibe (ZETIA) 10 MG  tablet Take 10 mg by mouth daily.   Yes [provider]  fluticasone (FLONASE) 50 MCG/ACT nasal spray Place 2 sprays into both nostrils daily. 02/23/21  Yes Lynden Oxford Scales, PA-C  olmesartan (BENICAR) 40 MG tablet Take 1 tablet (40 mg total) by mouth daily. 08/13/20  Yes Lelon Perla, MD  pantoprazole (PROTONIX) 40 MG tablet TAKE 1 TABLET BY MOUTH TWICE DAILY 11/10/20  Yes Plotnikov, [name], MD  Vitamin D, Cholecalciferol, 50 MCG (2000 UT) CAPS Take 2,000 Units by mouth daily.    Yes [provider]  benzonatate (TESSALON) 200 MG capsule Take 1 capsule (200 mg total) by mouth 3 (three) times daily as needed for cough. Patient not taking: Reported on 03/05/2021 02/25/21   Plotnikov, [name], MD  chlorpheniramine-HYDROcodone (TUSSIONEX PENNKINETIC ER) 10-8 MG/5ML SUER Take 5 ml po bid x 1 week, then 2.5 ml bid x 1 week, then taper off and d/c Patient not taking: Reported on 03/05/2021 02/25/21   Plotnikov, [name], MD  ezetimibe (ZETIA) 10 MG tablet Take 1 tablet (10 mg total) by mouth daily. 11/30/20 02/28/21  Lelon Perla, MD  ipratropium (ATROVENT) 0.06 % nasal spray Place 2 sprays into both nostrils 4 (four) times daily. As needed for nasal congestion, runny nose Patient not taking: Reported on 03/05/2021 02/23/21   Lynden Oxford Scales, PA-C  oseltamivir (TAMIFLU) 75 MG capsule Take 1 capsule (75 mg total) by mouth every 12 (twelve) hours. Patient not taking: Reported on 03/05/2021 02/26/21   Fredia Sorrow, MD    Physical Exam: Physical Exam Constitutional:      Comments: Pleasant elderly gentleman laying in bed actively coughing appearing uncomfortable but no distress  HENT:     Head: Normocephalic and atraumatic.     Mouth/Throat:     Mouth: Mucous membranes are moist.  Eyes:     Extraocular Movements: Extraocular movements intact.  Cardiovascular:     Rate and Rhythm: Normal rate and regular rhythm.     Heart sounds: Normal heart sounds.   Pulmonary:     Comments: Slightly poor effort with coarse bilateral sounds, no wheezing Abdominal:     General: Bowel sounds are normal. There is no distension.     Palpations: Abdomen is soft.     Tenderness: There is no abdominal tenderness.  Musculoskeletal:        General: Normal range of motion.     Cervical back: Normal range of motion and neck supple.  Skin:    General: Skin is warm and dry.  Neurological:     Comments: Dysarthria appreciated  Psychiatric:        Mood and Affect: Mood normal.        Behavior: Behavior normal.     Vitals:   03/07/21 0430 03/07/21 0822 03/07/21 1252 03/07/21 1628  BP: (!) 159/56 (!) 159/58 (!) 125/59 (!) 142/67  Pulse: 77 74 83 79  Resp: (!) 22 20 20  (!) 22  Temp: 98.6 F (37 C) 98.3 F (36.8 C) 98.2 F (36.8 C) 97.9 F (36.6 C)  TempSrc: Oral Oral Oral Oral  SpO2: 96% 97% 94% 98%  Weight:        Data Reviewed:   CXR reviewed personally. Bilateral infiltrates concerning for pna and/or aspiration. Some vascular congestion appreciated as well   Family Communication: Wife and daughter in room Primary team communication: secure chat sent to Dr. Erlinda Hong Thank you very much for involving [place] in the care of your patient.  Author: Dwyane Dee, MD 03/07/2021 6:13 PM  For on call review [email or web address].

## 2021-03-07 NOTE — Progress Notes (Signed)
STROKE TEAM PROGRESS NOTE   INTERVAL HISTORY Speech therapist as at the bedside.  Pt lying in bed, still has frequent coughing, add Mucinex and Tessalon yesterday but not very effective.  Patient continues to have mild tachycardia RR 20s, WBC elevated from 12.9-19.2.  Respiratory panel showed negative for flu and COVID.  Will request information consult.  OBJECTIVE Vitals:   03/07/21 0430 03/07/21 0822 03/07/21 1252 03/07/21 1628  BP: (!) 159/56 (!) 159/58 (!) 125/59 (!) 142/67  Pulse: 77 74 83 79  Resp: (!) 22 20 20  (!) 22  Temp: 98.6 F (37 C) 98.3 F (36.8 C) 98.2 F (36.8 C) 97.9 F (36.6 C)  TempSrc: Oral Oral Oral Oral  SpO2: 96% 97% 94% 98%  Weight:        CBC:  Recent Labs  Lab 03/05/21 1420 03/06/21 0515 03/07/21 0137  WBC 12.5* 12.9* 19.2*  NEUTROABS 9.0*  --   --   HGB 11.8* 10.7* 10.8*  HCT 35.7* 32.2* 32.4*  MCV 89.9 89.2 88.3  PLT 208 191 161    Basic Metabolic Panel:  Recent Labs  Lab 03/06/21 0515 03/07/21 0137  NA 136 139  K 3.7 3.6  CL 112* 113*  CO2 18* 18*  GLUCOSE 98 125*  BUN 21 22  CREATININE 0.99 1.00  CALCIUM 9.8 9.5    Lipid Panel:     Component Value Date/Time   CHOL 77 03/06/2021 0515   CHOL 172 11/18/2020 0843   TRIG 78 03/06/2021 0515   HDL 15 (L) 03/06/2021 0515   HDL 48 11/18/2020 0843   CHOLHDL 5.1 03/06/2021 0515   VLDL 16 03/06/2021 0515   LDLCALC 46 03/06/2021 0515   LDLCALC 106 (H) 11/18/2020 0843   HgbA1c:  Lab Results  Component Value Date   HGBA1C 6.3 (H) 03/06/2021   Urine Drug Screen: No results found for: LABOPIA, COCAINSCRNUR, LABBENZ, AMPHETMU, THCU, LABBARB  Alcohol Level No results found for: ETH  IMAGING  MR BRAIN WO CONTRAST  Result Date: 03/05/2021 CLINICAL DATA:  Left-sided weakness EXAM: MRI HEAD WITHOUT CONTRAST TECHNIQUE: Multiplanar, multiecho pulse sequences of the brain and surrounding structures were obtained without intravenous contrast. COMPARISON:  None. FINDINGS: Brain: Multifocal  acute cortical ischemia in the anterior right MCA territory. Small focus of acute/subacute ischemia at the left caudate head. No acute or chronic hemorrhage. There is multifocal hyperintense T2-weighted signal within the white matter. Parenchymal volume and CSF spaces are normal. The midline structures are normal. Vascular: Major flow voids are preserved. Skull and upper cervical spine: Normal calvarium and skull base. Visualized upper cervical spine and soft tissues are normal. Sinuses/Orbits:No paranasal sinus fluid levels or advanced mucosal thickening. No mastoid or middle ear effusion. Normal orbits. IMPRESSION: 1. Multifocal acute/early subacute cortical ischemia in the anterior right MCA territory. No hemorrhage or mass effect. 2. Small focus of acute/early subacute ischemia at the left caudate head. Electronically Signed   By: Ulyses Jarred M.D.   On: 03/05/2021 21:47   DG CHEST PORT 1 VIEW  Result Date: 03/07/2021 CLINICAL DATA:  Respiratory abnormalities. EXAM: PORTABLE CHEST 1 VIEW COMPARISON:  Radiograph 02/26/2021 FINDINGS: Lung volumes are low. Stable heart size and mediastinal contours. Patchy opacity in both lower lung zones. Mild additional patchy opacity in the right suprahilar lung. Vascular congestion. No pleural effusion. Anti lordotic positioning. IMPRESSION: 1. Low lung volumes with patchy bibasilar and right suprahilar opacities, may represent multifocal pneumonia, particularly at the right lung base. 2. Vascular congestion. Electronically Signed  By: Keith Rake M.D.   On: 03/07/2021 17:35   ECHOCARDIOGRAM COMPLETE  Result Date: 03/06/2021    ECHOCARDIOGRAM REPORT   Patient Name:   David [name] Date of Exam: 03/06/2021 Medical Rec #:  [id number]         Height:       72.0 in Accession #:    [id number]        Weight:       210.1 lb Date of Birth:  Jul 04, 1940          BSA:          2.176 m Patient Age:    81 years          BP:           143/66 mmHg Patient Gender: M                  HR:           93 bpm. Exam Location:  Inpatient Procedure: 2D Echo, Cardiac Doppler, Color Doppler and Intracardiac            Opacification Agent Indications:    Stroke  History:        Patient has prior history of Echocardiogram examinations, most                 recent 06/13/2019.  Sonographer:    Clayton Lefort RDCS (AE) Referring Phys: [id number] Derek Jack  Sonographer Comments: Technically difficult study due to poor echo windows and no subcostal window. Image acquisition challenging due to respiratory motion. Patient coughing throughout test. IMPRESSIONS  1. Left ventricular ejection fraction, by estimation, is 65 to 70%. The left ventricle has normal function. The left ventricle has no regional wall motion abnormalities. There is moderate left ventricular hypertrophy. Left ventricular diastolic parameters are indeterminate.  2. Right ventricular systolic function is normal. The right ventricular size is normal.  3. The mitral valve is normal in structure. No evidence of mitral valve regurgitation. No evidence of mitral stenosis.  4. The aortic valve is normal in structure. Aortic valve regurgitation is not visualized. No aortic stenosis is present.  5. The inferior vena cava is normal in size with greater than 50% respiratory variability, suggesting right atrial pressure of 3 mmHg. Comparison(s): Prior ascending aorta 42-43 mm. Unable to visual in current study. Conclusion(s)/Recommendation(s): No intracardiac source of embolism detected on this transthoracic study. Consider a transesophageal echocardiogram to exclude cardiac source of embolism if clinically indicated. FINDINGS  Left Ventricle: Left ventricular ejection fraction, by estimation, is 65 to 70%. The left ventricle has normal function. The left ventricle has no regional wall motion abnormalities. The left ventricular internal cavity size was normal in size. There is  moderate left ventricular hypertrophy. Left ventricular diastolic parameters are  indeterminate. Right Ventricle: The right ventricular size is normal. No increase in right ventricular wall thickness. Right ventricular systolic function is normal. Left Atrium: Left atrial size was normal in size. Right Atrium: Right atrial size was normal in size. Pericardium: There is no evidence of pericardial effusion. Mitral Valve: The mitral valve is normal in structure. No evidence of mitral valve regurgitation. No evidence of mitral valve stenosis. Tricuspid Valve: The tricuspid valve is normal in structure. Tricuspid valve regurgitation is not demonstrated. No evidence of tricuspid stenosis. Aortic Valve: The aortic valve is normal in structure. Aortic valve regurgitation is not visualized. No aortic stenosis is present. Aortic valve mean gradient measures 7.0 mmHg. Aortic valve peak gradient  measures 14.1 mmHg. Aortic valve area, by VTI measures 3.49 cm. Pulmonic Valve: The pulmonic valve was normal in structure. Pulmonic valve regurgitation is not visualized. No evidence of pulmonic stenosis. Aorta: The aortic root is normal in size and structure. Venous: The inferior vena cava is normal in size with greater than 50% respiratory variability, suggesting right atrial pressure of 3 mmHg. IAS/Shunts: No atrial level shunt detected by color flow Doppler.  LEFT VENTRICLE PLAX 2D LVIDd:         4.70 cm LVIDs:         2.90 cm LV PW:         1.60 cm LV IVS:        1.80 cm LVOT diam:     2.30 cm LV SV:         108 LV SV Index:   49 LVOT Area:     4.15 cm  RIGHT VENTRICLE RV S prime:     17.40 cm/s TAPSE (M-mode): 2.4 cm LEFT ATRIUM           Index LA diam:      1.50 cm 0.69 cm/m LA Vol (A2C): 36.2 ml 16.64 ml/m LA Vol (A4C): 35.1 ml 16.13 ml/m  AORTIC VALVE AV Area (Vmax):    3.14 cm AV Area (Vmean):   3.59 cm AV Area (VTI):     3.49 cm AV Vmax:           188.00 cm/s AV Vmean:          126.000 cm/s AV VTI:            0.308 m AV Peak Grad:      14.1 mmHg AV Mean Grad:      7.0 mmHg LVOT Vmax:          142.00 cm/s LVOT Vmean:        109.000 cm/s LVOT VTI:          0.259 m LVOT/AV VTI ratio: 0.84  AORTA Ao Asc diam: 3.70 cm MITRAL VALVE MV Area (PHT): 2.87 cm     SHUNTS MV Decel Time: 264 msec     Systemic VTI:  0.26 m MV E velocity: 92.40 cm/s   Systemic Diam: 2.30 cm MV A velocity: 111.00 cm/s MV E/A ratio:  0.83 Candee Furbish MD Electronically signed by Candee Furbish MD Signature Date/Time: 03/06/2021/2:18:39 PM    Final     ECG - SR rate 83 BPM. (Computer read as atrial fibrillation) (See cardiology reading for complete details)  PHYSICAL EXAM  Temp:  [97.9 F (36.6 C)-98.6 F (37 C)] 97.9 F (36.6 C) (01/08 1628) Pulse Rate:  [74-83] 79 (01/08 1628) Resp:  [20-25] 22 (01/08 1628) BP: (118-159)/(49-76) 142/67 (01/08 1628) SpO2:  [93 %-98 %] 98 % (01/08 1628)  General - Well nourished, well developed, in no apparent distress, mildly lethargic.  Ophthalmologic - fundi not visualized due to noncooperation.  Cardiovascular - Regular rhythm and rate, except with frequent PVCs on tele.  Neuro - awake, alert but lethargic, eyes open, orientated to age, place, time and people. No aphasia, fluent language but paucity of speech, following all simple commands. Able to name and repeat. No gaze palsy, tracking bilaterally, visual field full, PERRL. Right mild facial droop. Tongue midline. RUE 4/5 proximal and distal, LUE pronator drift proximally and distal finger grip and finger movement 3/5. Bilaterally LEs 3/5 proximal, and 5/5 distally. Sensation symmetrical bilaterally, b/l FTN intact although slow on the left proportional to the weakness,  gait not tested.     ASSESSMENT/PLAN David [name] is a 81 y.o. male with history of coronary artery disease, hyperlipidemia, hypertension, remote history of prostate cancer, renal oncocytoma of right kidney in 2014 presenting with left sided weakness, dysarthria and visual neglect.  He did not receive TNKase due to unknown time of onset but found to have  right ICA critical stenosis s/p Interventional Radiology with right CAS.  Stroke: Multifocal acute/early subacute cortical ischemia in the anterior right MCA territory, likely large vessel source from critical R ICA stenosis s/p right ICA stenting. Left caudate small infarct could be procedure related vs. Occult afib CT Head - No acute intracranial pathology. ASPECTS is 10.  CTA Neck - Prominent irregular hypodensity within the proximal right ICA. A portion of this hypodensity likely reflects soft plaque. However, the irregular portion is highly suspicious for superimposed thrombus (or unstable plaque). Resultant severe stenosis of the proximal right ICA estimated to be 80%.  CTA Head - Moderate/severe stenosis within a superior division mid M2 right MCA vessel. Moderate stenosis within the A4 left anterior cerebral artery. Moderate stenosis of the paraclinoid right ICA. IR with right ICA critical stenosis s/p CAS MRI head - Multifocal acute/early subacute cortical ischemia in the anterior right MCA territory. No hemorrhage or mass effect. Small focus of acute/early subacute ischemia at the left caudate head. 2D Echo -EF 65 to 70% Discussed with Dr. Stanford Breed, recommend Loop recorder to rule out afib Lacey Jensen Virus 2 - negative 02/26/21 LDL - 106 HgbA1c - 6.3 VTE prophylaxis - Lovenox 40 mg daily  aspirin 81 mg daily prior to admission, now on aspirin 81 mg daily and Brilinta (ticagrelor) 90 mg bid post stent Patient will be counseled to be compliant with his antithrombotic medications Ongoing aggressive stroke risk factor management Therapy recommendations:  CIR Disposition:  Pending  R carotid stenosis CTA Neck - Prominent irregular hypodensity within the proximal right ICA. A portion of this hypodensity likely reflects soft plaque. However, the irregular portion is highly suspicious for superimposed thrombus (or unstable plaque). Resultant severe stenosis of the proximal right ICA estimated  to be 80%.  Likely the cause of current stroke S/p emergent RICA stenting On ASA and brilinta  Flu Sinusitis  ? Pneumonia ? aspiration Flu A positive 02/26/21 -> now negative on repeat CT head - Layering fluid in the maxillary and sphenoid sinuses which can be seen with acute sinusitis in the correct clinical setting. On mucinex and tessalon  Rrespiratory panel negative for flu and COVID Leukocytosis WBC 12.9-19.2 Appreciate Dr Sabino Gasser assistance.   Frequent PVCs Frequent PVCs on tele MRI did show left CR small infarcts, not sure if related to procedure or cardioembolic Follows with Dr. Boris Lown as outpt, discussed with Dr. Stanford Breed, will do loop recorder on discharge.  Hypertension Home BP meds: Benicar 40 mg daily Stable BP < 180/105 Long-term BP goal normotensive  Hyperlipidemia Home Lipid lowering medication: Lipitor 80 mg daily / Zetia 10 mg daily LDL 106, goal < 70 Current lipid lowering medication: Lipitor 80 mg daily / Zetia 10 mg daily Continue statin at discharge  Other Stroke Risk Factors Advanced age Former cigarette smoker - quit ETOH use, advised to drink no more than 1 alcoholic beverage per day. Overweight, Body mass index is 28.49 kg/m., recommend weight loss, diet and exercise as appropriate  Coronary artery disease  Other Active Problems, Findings, Recommendations and/or Plan Code status - Full code CKD - stage 3a - creatinine - 1.20->1.26->0.99  Mild Leukocytosis - WBC's - 12.5 (afebrile) ->12.9 CTA Neck - 3 cm exophytic nodule extending inferiorly from the right thyroid lobe. This nodule was not described on the prior thyroid ultrasound of 02/27/2020. A repeat thyroid ultrasound is ordered to further characterize this nodule.  Aortic Atherosclerosis (ICD10-I70.0)   Hospital day # 2  I discussed with Dr. Sabino Gasser. Time spent in counseling and coordination of care, reviewing test results, images and medication, and discussing the diagnosis, treatment  plan and potential prognosis. This patient's care requiresreview of multiple databases, neurological assessment, discussion with family, other specialists and medical decision making of high complexity.  Rosalin Hawking, MD PhD Stroke Neurology 03/07/2021 6:16 PM      To contact Stroke Continuity provider, please refer to [email or web address] After hours, contact General Neurology

## 2021-03-07 NOTE — Hospital Course (Signed)
Mr. Rosevear is a 82 yo male with PMH CAD, HTN, HLD, GERD, remote prostate cancer, renal oncocytoma R kidney 2014 who presented originally on 03/05/21 as a code stroke with left side weakness.  Work-up revealed severe stenosis involving right carotid bulb and he underwent stenting and IR and was started on aspirin and Brilinta. He has also been having a persistent cough for approximately 2 to 3 weeks and had even completed outpatient course of antibiotics and steroids around [place].  He also tested positive for influenza A on 02/26/2021 and completed Tamiflu course for this also.  Despite this, his cough has persisted and his wife has also had a severe lingering cough being treated with antibiotics at home as well.  He does not tolerate codeine derived products such as Tussionex and declines use of this for symptomatic treatment. Repeat COVID and flu swabs were performed on 03/06/2021 and were negative.  CXR performed on 03/07/2021 shows bilateral infiltrates concerning for multifocal pneumonia.  Does appear to also have some underlying vascular congestion, fluids have also been running since 03/05/2021. Lab work-up notable for increasing leukocytosis, 19.2 today up from 12.9 yesterday. No fevers noted.  He is on 2 L oxygen for symptom control however is not truly oxygen dependent. Due to his ongoing cough and uptrending leukocytosis, we were asked to consult for further evaluation and recommendations.

## 2021-03-07 NOTE — Evaluation (Addendum)
Speech Language Pathology Evaluation Patient Details Name: David [name] MRN: [id number] DOB: 08/11/1940 Today's Date: 03/07/2021 Time: 2202-5427 SLP Time Calculation (min) (ACUTE ONLY): 17 min  Problem List:  Patient Active Problem List   Diagnosis Date Noted   Stroke (cerebrum) ([place]) 03/05/2021   Sinusitis 01/16/2021   Atherosclerosis of aorta ([place]) 10/24/2020   Constipation 10/06/2020   COVID-19 07/09/2020   Lightheadedness 07/09/2020   S/P total knee arthroplasty, left 05/26/2020   DISH (diffuse idiopathic skeletal hyperostosis) 05/20/2020   Preop exam for internal medicine 05/20/2020   Foot contracture, right 06/06/2019   DOE (dyspnea on exertion) 05/30/2019   Edema 05/30/2019   Thyroid nodule 04/15/2019   Neck pain 04/15/2019   Carotid stenosis, asymptomatic, bilateral 01/10/2019   Vitamin D deficiency, unspecified 04/06/2018   Prediabetes 04/06/2018   Elevated alkaline phosphatase level 04/06/2018   Pain in right knee 04/27/2017   Allergic rhinitis 11/18/2015   Generalized osteoarthritis of multiple sites 08/06/2015   GERD (gastroesophageal reflux disease) 06/25/2015   Pulmonary nodule 07/05/2013   Cough 06/07/2013   Fatigue 06/07/2013   Blood loss anemia 03/18/2013   Right retroperitoneal urinoma with ureteral leak 03/05/2013   Renal oncocytoma of right kidney 01/23/2013   Nocturia 01/11/2013   Hip pain 03/05/2012   Hyperglycemia 06/08/2011   Prostate cancer ([place]) 08/21/7626   HERNIA, UMBILICAL 31/51/7616   ERECTILE DYSFUNCTION, ORGANIC 03/10/2009   Dyslipidemia 03/06/2008   Essential hypertension 03/06/2008   Past Medical History:  Past Medical History:  Diagnosis Date   Allergy    Arthritis    gen.  and left hip   BPH (benign prostatic hypertrophy)    Coronary artery disease    GERD (gastroesophageal reflux disease)    occasional   History of kidney stones    2007   Hyperlipidemia    Hypertension    Kidney tumor    LBP (low back pain) 06/2005    L5 radicular symptoms; MRI of LS spine severe spondylosis at L4-L5 with central cancal stenosis    Nocturia    Organic impotence    Prostate cancer ([place]) 10/2011   (Low grade) [facility] - Dr. Junious Silk   Pyelonephritis 03/05/2013   Renal oncocytoma of right kidney 01/23/2013   As 3.2 x 3.5 cm enhancing exophytic mass projecting off the lower pole. This is consistent with a renal cell carcinoma. No retroperitoneal lymphadenopathy.    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   Wears glasses    Past Surgical History:  Past Surgical History:  Procedure Laterality Date   COLONOSCOPY  02/15/2006, 2014   Internal hemorrhoids (Dr [name]), last in 2014   [place] Bilateral 02/15/2013   Procedure: LYMPHADENECTOMY WITH INDOCYANINE GREEN DYE INJECTION;  Surgeon: [name], MD;  Location: [facility];  Service: Urology;  Laterality: Bilateral;   PROSTATE BIOPSY N/A 12/18/2012   Procedure: BIOPSY TRANSRECTAL ULTRASONIC PROSTATE (TUBP);  Surgeon: [name], MD;  Location: [facility];  Service: Urology;  Laterality: N/A;   PROSTATE BIOPSY  12/05/11   gleason 6, 3/12 cores   REPAIR UMBILICAL AND VENTRAL HERNIA'S W/ MESH  08-21-2009   RIGHT/LEFT HEART CATH AND CORONARY ANGIOGRAPHY N/A 08/06/2019   Procedure: RIGHT/LEFT HEART CATH AND CORONARY ANGIOGRAPHY;  Surgeon: [name], Peter M, MD;  Location: [facility];  Service: Cardiovascular;  Laterality: N/A;   ROBOT ASSISTED LAPAROSCOPIC RADICAL PROSTATECTOMY N/A 02/15/2013   Procedure: ROBOTIC ASSISTED LAPAROSCOPIC RADICAL PROSTATECTOMY;  Surgeon: [name], MD;  Location: [facility];  Service: Urology;  Laterality: N/A;   ROBOTIC ASSITED PARTIAL NEPHRECTOMY Right 02/15/2013   Procedure: ROBOTIC ASSITED PARTIAL NEPHRECTOMY;  Surgeon: [name], MD;  Location: [facility];  Service: Urology;  Laterality: Right;   SPERMATOCELECTOMY Left 12/18/2012   Procedure: LEFT SPERMATOCELECTOMY;   Surgeon: [name], MD;  Location: [facility];  Service: Urology;  Laterality: Left;   TONSILLECTOMY AND ADENOIDECTOMY  as child   TOTAL KNEE ARTHROPLASTY Left 05/26/2020   Procedure: TOTAL KNEE ARTHROPLASTY;  Surgeon: Paralee Cancel, MD;  Location: [facility];  Service: Orthopedics;  Laterality: Left;  70 mins   TRANSURETHRAL RESECTION OF PROSTATE  AGE 21   HPI:  Pt is a 81 y/o male presented to ED on 03/05/21 for L sided weakness. Recent flue dx. CTA showed R ICA occlusion. S/p cerebral angiogram and R carotid stenting on 1/6. MRI showed multifocal acute/early subacute cortical ischemia in anterior R MCA and small focus of acute/early subacute ischemia at L caudate head. PMH: HTN, CAD, prostate cancer   Assessment / Plan / Recommendation Clinical Impression  Pt participated in speech/language/cognition evaluation.  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 Pt denied any baseline deficits in speech, language, or cognition. He reported that his speech is now "slurred" compared to his baseline, and he denied any changes in language. Per the pt, his thinking is "slow" compared to baseline. The [facility] Mental Status Examination was completed to evaluate the pt's cognitive-linguistic skills. He achieved a score of 22/30 which is below the normal limits of 27 or more out of 30 and is suggestive of a mild impairment. He exhibited difficulty in the areas of awareness, attention, memory, and executive function. He also presented with mild-moderate  dysarthria characterized by reduced articulatory precision, reduced respiratory support, and reduced vocal intensity which negatively impacted speech intelligibility at the sentence and conversational levels. Skilled SLP services are clinically indicated at this time to improve motor speech and cognitive-linguistic function.    SLP Assessment  SLP  Recommendation/Assessment: Patient needs continued Speech Lanaguage Pathology Services SLP Visit Diagnosis: Cognitive communication deficit (R41.841);Dysarthria and anarthria (R47.1)    Recommendations for follow up therapy are one component of a multi-disciplinary discharge planning process, led by the attending physician.  Recommendations may be updated based on patient status, additional functional criteria and insurance authorization.    Follow Up Recommendations  Acute inpatient rehab (3hours/day)    Assistance Recommended at Discharge  Intermittent Supervision/Assistance  Functional Status Assessment Patient has had a recent decline in their functional status and demonstrates the ability to make significant improvements in function in a reasonable and predictable amount of time.  Frequency and Duration min 2x/week  2 weeks      SLP Evaluation Cognition  Overall Cognitive Status: Impaired/Different from baseline Arousal/Alertness: Awake/alert Orientation Level: Oriented X4 Year: 2023 Month: January Day of Week: Incorrect Attention: Focused;Sustained Focused Attention: Appears intact Focused Attention Impairment: Verbal complex Sustained Attention: Impaired Sustained Attention Impairment: Verbal complex Memory: Impaired Memory Impairment:  (Immediate: 5/5; delayed:1/5; with cues: 4/4) Awareness: Impaired Awareness Impairment: Emergent impairment Executive Function: Sequencing;Organizing Sequencing:  (clock drawing: 4/4) Organizing: Impaired Organizing Impairment: Verbal complex (backward digit span: 0/2)       Comprehension  Auditory Comprehension Overall Auditory Comprehension: Appears within functional limits for tasks assessed Yes/No Questions: Within Functional Limits Commands: Within Functional Limits Conversation: Complex    Expression Expression  Primary Mode of Expression: Verbal Verbal Expression Overall Verbal Expression: Appears within functional limits  for tasks assessed Initiation: No impairment Level of Generative/Spontaneous Verbalization: Conversation Repetition: No impairment Naming: No impairment   Oral / Motor  Oral Motor/Sensory Function Overall Oral Motor/Sensory Function: Mild impairment Facial ROM: Reduced left;Suspected CN VII (facial) dysfunction Facial Symmetry: Abnormal symmetry left;Suspected CN VII (facial) dysfunction Facial Strength: Reduced left;Suspected CN VII (facial) dysfunction Lingual Strength: Reduced;Suspected CN XII (hypoglossal) dysfunction Motor Speech Overall Motor Speech: Impaired Respiration: Impaired Level of Impairment: Conversation Phonation: Low vocal intensity Resonance: Within functional limits Articulation: Impaired Level of Impairment: Sentence Intelligibility: Intelligibility reduced Word: 75-100% accurate Phrase: 75-100% accurate Sentence: 50-74% accurate Conversation: 25-49% accurate Motor Planning: Witnin functional limits Motor Speech Errors: Aware;Consistent           Deadrian Toya I. Hardin Negus, [place], [place] Office number [phone] Pager [place] 03/07/2021, 3:29 PM

## 2021-03-08 DIAGNOSIS — R1312 Dysphagia, oropharyngeal phase: Secondary | ICD-10-CM

## 2021-03-08 LAB — BASIC METABOLIC PANEL
Anion gap: 9 (ref 5–15)
BUN: 21 mg/dL (ref 8–23)
CO2: 22 mmol/L (ref 22–32)
Calcium: 9.4 mg/dL (ref 8.9–10.3)
Chloride: 108 mmol/L (ref 98–111)
Creatinine, Ser: 1.08 mg/dL (ref 0.61–1.24)
GFR, Estimated: >60 mL/min (ref >60)
Glucose, Bld: 115 mg/dL — ABNORMAL HIGH (ref 70–99)
Potassium: 3.3 mmol/L — ABNORMAL LOW (ref 3.5–5.1)
Sodium: 139 mmol/L (ref 135–145)

## 2021-03-08 LAB — CBC WITH DIFFERENTIAL/PLATELET
Abs Immature Granulocytes: 0.16 10*3/uL — ABNORMAL HIGH (ref 0.00–0.07)
Basophils Absolute: 0 10*3/uL (ref 0.0–0.1)
Basophils Relative: 0 %
Eosinophils Absolute: 0 10*3/uL (ref 0.0–0.5)
Eosinophils Relative: 0 %
HCT: 33.2 % — ABNORMAL LOW (ref 39.0–52.0)
Hemoglobin: 11 g/dL — ABNORMAL LOW (ref 13.0–17.0)
Immature Granulocytes: 1 %
Lymphocytes Relative: 6 %
Lymphs Abs: 1.1 10*3/uL (ref 0.7–4.0)
MCH: 29.3 pg (ref 26.0–34.0)
MCHC: 33.1 g/dL (ref 30.0–36.0)
MCV: 88.5 fL (ref 80.0–100.0)
Monocytes Absolute: 1.1 10*3/uL — ABNORMAL HIGH (ref 0.1–1.0)
Monocytes Relative: 6 %
Neutro Abs: 16 10*3/uL — ABNORMAL HIGH (ref 1.7–7.7)
Neutrophils Relative %: 87 %
Platelets: 260 10*3/uL (ref 150–400)
RBC: 3.75 MIL/uL — ABNORMAL LOW (ref 4.22–5.81)
RDW: 14.7 % (ref 11.5–15.5)
WBC: 18.3 10*3/uL — ABNORMAL HIGH (ref 4.0–10.5)
nRBC: 0 % (ref 0.0–0.2)

## 2021-03-08 LAB — PROCALCITONIN: Procalcitonin: 0.45 ng/mL

## 2021-03-08 MED ORDER — POTASSIUM CHLORIDE 20 MEQ PO PACK
20.0000 meq | PACK | Freq: Once | ORAL | Status: AC
  Administered 2021-03-08: 20 meq via ORAL
  Filled 2021-03-08: qty 1

## 2021-03-08 MED ORDER — BENZONATATE 100 MG PO CAPS
100.0000 mg | ORAL_CAPSULE | Freq: Three times a day (TID) | ORAL | Status: DC
  Administered 2021-03-08 – 2021-03-09 (×5): 100 mg via ORAL
  Filled 2021-03-08 (×5): qty 1

## 2021-03-08 MED ORDER — ALBUTEROL SULFATE (2.5 MG/3ML) 0.083% IN NEBU: 3.0000 mL | INHALATION_SOLUTION | Freq: Four times a day (QID) | RESPIRATORY_TRACT | Status: DC | PRN

## 2021-03-08 NOTE — Progress Notes (Signed)
PROGRESS NOTE  David David [name]  GYK:[id number] DOB: [birth year] DOA: 03/05/2021 PCP: David David [name], David [name]  Brief Narrative: David David [name] is a 81 yo male with PMH CAD, HTN, HLD, GERD, remote prostate David [name], renal oncocytoma R kidney 2014, influenza diagnosed 02/26/2021 s/p tamiflu, also s/p antibiotics and steroids for persistent cough who presented originally on 03/05/21 as a code stroke with left side weakness.  Work-up revealed multifocal acute/early subacute right MCA CVA and severe stenosis involving right carotid bulb. Neurology admitted the patient and he underwent stenting by neuroIR, started on aspirin and brilinta. Due to worsening of his subacute cough since admission, rising white count, medicine consulted. Covid, flu PCR checked on admission negative. CXR revealed low volume, bibasilar patchy opacities and suggestion of vascular congestion.   Assessment & Plan: Suspected aspiration pneumonia: Leukocytosis despite no longer being on steroids, persistent cough worse with po intake. Onset began prior to stroke and worsened after. PCT also elevated at 0.45. Recent influenza may have worsened symptoms, though aspiration is primary concern. MRSA negative, full RVP negative, S. pneumo UAg negative.  - Continue unasyn. Showing improvement. - SLP consulted: Formal imaging swallow study planned 1/10.  - Continue antitussives as ordered. D/w family that hydrocodone-containing products would not necessarily precipitate the same intolerance that codeine has in the past, though with symptoms improving we will not trial this yet.  - Continue PPI, consider EGD (never had this) to eval for esophagitis.  - Reportedly on olmesartan PTA, not currently. Not likely to be causing cough with alternative explanations, but could continue trial of alternative agent.  - Hx mild obstructive lung disease by PFTs 2021 (FEV1/FVC 71%). No wheezing, but will make prn albuterol available.  - No hypoxia documented.  -  Incentive spirometry needed w/low volumes on CXR. - Suspect vascular congestion on CXR relates to hypoventilation. Not overloaded clinically, echo w/preserved biventricular function. Will not continue lasix for now.  Right MCA infarcts due to severe right carotid stenosis s/p emergent RICA stenting:  - Continue DAPT per neurology, neuro-IR. Continue lipitor, zetia. Having PVCs, primary d/w cardiology. Will pursue loop recorder at discharge. CIR is planned disposition.   Right thyroid nodule: U/S confirms need for 1 year repeat U/S. Suspected inferiorly extending abnormality on CTA neck not noted on U/S, suspect extra-thyroidal.   Subjective: Coughing is improved from yesterday, remains afebrile. Mental status is improved as well per family at bedside.   Objective: Vitals:   03/07/21 2030 03/08/21 0022 03/08/21 0427 03/08/21 0800  BP: (!) 147/64 140/67 128/66 (!) 140/55  Pulse: 77 94 60 (!) 51  Resp: 20 (!) 22 20 17   Temp: 97.6 F (36.4 C) 99.1 F (37.3 C) 98.9 F (37.2 C) (!) 97.3 F (36.3 C)  TempSrc: Oral Axillary Axillary Oral  SpO2: 97% 98% 96% 95%  Weight:       Gen: 81 y.o. male in no distress  Pulm: Non-labored breathing, diminished without crackles or wheezes. CV: Regular rate and rhythm. No murmur, rub, or gallop. No JVD, no significant pedal edema. GI: Abdomen soft, non-tender, non-distended, with normoactive bowel sounds. No organomegaly or masses felt. Ext: Warm, no deformities Skin: No rashes, lesions or ulcers on visualized skin. Neuro: Drowsy but rousable and interactive, and oriented.  Psych: Judgement and insight appear intact. Mood & affect appropriate.   Data Reviewed: I have personally reviewed following labs and imaging studies  CBC: Recent Labs  Lab 03/05/21 1419 03/05/21 1420 03/06/21 0515 03/07/21 0137 03/08/21 0350  WBC  --  12.5* 12.9* 19.2* 18.3*  NEUTROABS  --  9.0*  --   --  16.0*  HGB 11.9* 11.8* 10.7* 10.8* 11.0*  HCT 35.0* 35.7* 32.2*  32.4* 33.2*  MCV  --  89.9 89.2 88.3 88.5  PLT  --  208 191 226 734   Basic Metabolic Panel: Recent Labs  Lab 03/05/21 1419 03/05/21 1420 03/06/21 0515 03/07/21 0137 03/08/21 0350  NA 139 136 136 139 139  K 3.6 3.5 3.7 3.6 3.3*  CL 112* 110 112* 113* 108  CO2  --  18* 18* 18* 22  GLUCOSE 85 86 98 125* 115*  BUN 26* 28* 21 22 21   CREATININE 1.20 1.26* 0.99 1.00 1.08  CALCIUM  --  10.1 9.8 9.5 9.4   GFR: Estimated Creatinine Clearance: 66.4 mL/min (by C-G formula based on SCr of 1.08 mg/dL). Liver Function Tests: Recent Labs  Lab 03/05/21 1420 03/06/21 0515  AST 52* 44*  ALT 52* 44  ALKPHOS 111 98  BILITOT 1.1 1.1  PROT 6.5 5.9*  ALBUMIN 2.7* 2.4*   No results for input(s): LIPASE, AMYLASE in the last 168 hours. No results for input(s): AMMONIA in the last 168 hours. Coagulation Profile: Recent Labs  Lab 03/05/21 1420  INR 1.2   Cardiac Enzymes: No results for input(s): CKTOTAL, CKMB, CKMBINDEX, TROPONINI in the last 168 hours. BNP (last 3 results) No results for input(s): PROBNP in the last 8760 hours. HbA1C: Recent Labs    03/06/21 0515  HGBA1C 6.3*   CBG: Recent Labs  Lab 03/05/21 1414  GLUCAP 88   Lipid Profile: Recent Labs    03/06/21 0515  CHOL 77  HDL 15*  LDLCALC 46  TRIG 78  CHOLHDL 5.1   Thyroid Function Tests: No results for input(s): TSH, T4TOTAL, FREET4, T3FREE, THYROIDAB in the last 72 hours. Anemia Panel: No results for input(s): VITAMINB12, FOLATE, FERRITIN, TIBC, IRON, RETICCTPCT in the last 72 hours. Urine analysis:    Component Value David [name]   COLORURINE YELLOW 05/19/2020 David David [name] 05/19/2020 0942   LABSPEC 1.025 05/19/2020 0942   PHURINE 5.5 05/19/2020 0942   GLUCOSEU NEGATIVE 05/19/2020 0942   HGBUR TRACE-INTACT (A) 05/19/2020 0942   BILIRUBINUR NEGATIVE 05/19/2020 0942   BILIRUBINUR n 06/07/2013 1338   KETONESUR NEGATIVE 05/19/2020 0942   PROTEINUR NEGATIVE 08/14/2017 0250   UROBILINOGEN 0.2  05/19/2020 0942   NITRITE NEGATIVE 05/19/2020 0942   LEUKOCYTESUR NEGATIVE 05/19/2020 0942   Recent Results (from the past 240 hour(s))  Culture, blood (Routine x 2)     Status: None   Collection Time: 02/26/21  1:08 PM   Specimen: Right Antecubital; Blood  Result Value Ref Range Status   Specimen Description   Final    RIGHT ANTECUBITAL Performed at [facility], [address], [place], [place] [address]    Special Requests   Final    BOTTLES DRAWN AEROBIC AND ANAEROBIC Blood Culture adequate volume Performed at [facility], [address], [place], [place] [address]    Culture   Final    NO GROWTH 5 DAYS Performed at [facility], [place] [address] [place], [place] [address]    Report Status 03/03/2021 FINAL  Final  Resp Panel by RT-PCR (Flu A&B, Covid) Nasopharyngeal Swab     Status: Abnormal   Collection Time: 02/26/21  1:09 PM   Specimen: Nasopharyngeal Swab; Nasopharyngeal(NP) swabs in vial transport medium  Result Value Ref Range Status   SARS Coronavirus 2 by RT  PCR NEGATIVE NEGATIVE Final    Comment: (NOTE) SARS-CoV-2 target nucleic acids are NOT DETECTED.  The SARS-CoV-2 RNA is generally detectable in upper respiratory specimens during the acute phase of infection. The lowest concentration of SARS-CoV-2 viral copies this assay can detect is 138 copies/mL. A negative result does not preclude SARS-Cov-2 infection and should not be used as the sole basis for treatment or other patient management decisions. A negative result may occur with  improper specimen collection/handling, submission of specimen other than nasopharyngeal swab, presence of viral mutation(s) within the areas targeted by this assay, and inadequate number of viral copies(<138 copies/mL). A negative result must be combined with clinical observations, patient history, and epidemiological information. The expected result is Negative.  Fact Sheet for  Patients:  EntrepreneurPulse.com.au  Fact Sheet for Healthcare Providers:  IncredibleEmployment.be  This test is no t yet approved or cleared by the [place] FDA and  has been authorized for detection and/or diagnosis of SARS-CoV-2 by FDA under an Emergency Use Authorization (EUA). This EUA will remain  in effect (meaning this test can be used) for the duration of the COVID-19 declaration under Section 564(b)(1) of the Act, 21 U.S.C.section 360bbb-3(b)(1), unless the authorization is terminated  or revoked sooner.       Influenza A by PCR POSITIVE (A) NEGATIVE Final   Influenza B by PCR NEGATIVE NEGATIVE Final    Comment: (NOTE) The Xpert Xpress SARS-CoV-2/FLU/RSV plus assay is intended as an aid in the diagnosis of influenza from Nasopharyngeal swab specimens and should not be used as a sole basis for treatment. Nasal washings and aspirates are unacceptable for Xpert Xpress SARS-CoV-2/FLU/RSV testing.  Fact Sheet for Patients: EntrepreneurPulse.com.au  Fact Sheet for Healthcare Providers: IncredibleEmployment.be  This test is not yet approved or cleared by the [place] FDA and has been authorized for detection and/or diagnosis of SARS-CoV-2 by FDA under an Emergency Use Authorization (EUA). This EUA will remain in effect (meaning this test can be used) for the duration of the COVID-19 declaration under Section 564(b)(1) of the Act, 21 U.S.C. section 360bbb-3(b)(1), unless the authorization is terminated or revoked.  Performed at [facility], [address], [place], [place] [address]   MRSA Next Gen by PCR, Nasal     Status: None   Collection Time: 03/05/21  6:36 PM   Specimen: Nasal Mucosa; Nasal Swab  Result Value Ref Range Status   MRSA by PCR Next Gen NOT DETECTED NOT DETECTED Final    Comment: (NOTE) The GeneXpert MRSA Assay (FDA approved for NASAL specimens  only), is one component of a comprehensive MRSA colonization surveillance program. It is not intended to diagnose MRSA infection nor to guide or monitor treatment for MRSA infections. Test performance is not FDA approved in patients less than 54 years old. Performed at [facility], [place] [address], [place],  [address]   Resp Panel by RT-PCR (Flu A&B, Covid) Nasopharyngeal Swab     Status: None   Collection Time: 03/06/21  3:31 PM   Specimen: Nasopharyngeal Swab; Nasopharyngeal(NP) swabs in vial transport medium  Result Value Ref Range Status   SARS Coronavirus 2 by RT PCR NEGATIVE NEGATIVE Final    Comment: (NOTE) SARS-CoV-2 target nucleic acids are NOT DETECTED.  The SARS-CoV-2 RNA is generally detectable in upper respiratory specimens during the acute phase of infection. The lowest concentration of SARS-CoV-2 viral copies this assay can detect is 138 copies/mL. A negative result does not preclude SARS-Cov-2 infection and should not  be used as the sole basis for treatment or other patient management decisions. A negative result may occur with  improper specimen collection/handling, submission of specimen other than nasopharyngeal swab, presence of viral mutation(s) within the areas targeted by this assay, and inadequate number of viral copies(<138 copies/mL). A negative result must be combined with clinical observations, patient history, and epidemiological information. The expected result is Negative.  Fact Sheet for Patients:  EntrepreneurPulse.com.au  Fact Sheet for Healthcare Providers:  IncredibleEmployment.be  This test is no t yet approved or cleared by the [place] FDA and  has been authorized for detection and/or diagnosis of SARS-CoV-2 by FDA under an Emergency Use Authorization (EUA). This EUA will remain  in effect (meaning this test can be used) for the duration of the COVID-19 declaration under Section  564(b)(1) of the Act, 21 U.S.C.section 360bbb-3(b)(1), unless the authorization is terminated  or revoked sooner.       Influenza A by PCR NEGATIVE NEGATIVE Final   Influenza B by PCR NEGATIVE NEGATIVE Final    Comment: (NOTE) The Xpert Xpress SARS-CoV-2/FLU/RSV plus assay is intended as an aid in the diagnosis of influenza from Nasopharyngeal swab specimens and should not be used as a sole basis for treatment. Nasal washings and aspirates are unacceptable for Xpert Xpress SARS-CoV-2/FLU/RSV testing.  Fact Sheet for Patients: EntrepreneurPulse.com.au  Fact Sheet for Healthcare Providers: IncredibleEmployment.be  This test is not yet approved or cleared by the [place] FDA and has been authorized for detection and/or diagnosis of SARS-CoV-2 by FDA under an Emergency Use Authorization (EUA). This EUA will remain in effect (meaning this test can be used) for the duration of the COVID-19 declaration under Section 564(b)(1) of the Act, 21 U.S.C. section 360bbb-3(b)(1), unless the authorization is terminated or revoked.  Performed at [facility], [place] [address] [place], [place] [address]   Respiratory (~20 pathogens) panel by PCR     Status: None   Collection Time: 03/07/21  6:20 PM   Specimen: Nasopharyngeal Swab; Respiratory  Result Value Ref Range Status   Adenovirus NOT DETECTED NOT DETECTED Final   Coronavirus 229E NOT DETECTED NOT DETECTED Final    Comment: (NOTE) The Coronavirus on the Respiratory Panel, DOES NOT test for the novel  Coronavirus (2019 nCoV)    Coronavirus HKU1 NOT DETECTED NOT DETECTED Final   Coronavirus NL63 NOT DETECTED NOT DETECTED Final   Coronavirus OC43 NOT DETECTED NOT DETECTED Final   Metapneumovirus NOT DETECTED NOT DETECTED Final   Rhinovirus / Enterovirus NOT DETECTED NOT DETECTED Final   Influenza A NOT DETECTED NOT DETECTED Final   Influenza B NOT DETECTED NOT DETECTED Final   Parainfluenza  Virus 1 NOT DETECTED NOT DETECTED Final   Parainfluenza Virus 2 NOT DETECTED NOT DETECTED Final   Parainfluenza Virus 3 NOT DETECTED NOT DETECTED Final   Parainfluenza Virus 4 NOT DETECTED NOT DETECTED Final   Respiratory Syncytial Virus NOT DETECTED NOT DETECTED Final   Bordetella pertussis NOT DETECTED NOT DETECTED Final   Bordetella Parapertussis NOT DETECTED NOT DETECTED Final   Chlamydophila pneumoniae NOT DETECTED NOT DETECTED Final   Mycoplasma pneumoniae NOT DETECTED NOT DETECTED Final    Comment: Performed at [facility], [place]. [address] [place], [place] [address]      Radiology Studies: DG CHEST PORT 1 VIEW  Result Date: 03/07/2021 CLINICAL DATA:  Respiratory abnormalities. EXAM: PORTABLE CHEST 1 VIEW COMPARISON:  Radiograph 02/26/2021 FINDINGS: Lung volumes are low. Stable heart size and mediastinal contours.  Patchy opacity in both lower lung zones. Mild additional patchy opacity in the right suprahilar lung. Vascular congestion. No pleural effusion. Anti lordotic positioning. IMPRESSION: 1. Low lung volumes with patchy bibasilar and right suprahilar opacities, may represent multifocal pneumonia, particularly at the right lung base. 2. Vascular congestion. Electronically Signed   By: Keith Rake M.D.   On: 03/07/2021 17:35   ECHOCARDIOGRAM COMPLETE  Result Date: 03/06/2021    ECHOCARDIOGRAM REPORT   Patient Name:   PAIGE VANDERWOUDE Date of Exam: 03/06/2021 Medical Rec #:  [id number]         Height:       72.0 in Accession #:    [id number]        Weight:       210.1 lb Date of Birth:  1940/08/17          BSA:          2.176 m Patient Age:    50 years          BP:           143/66 mmHg Patient Gender: M                 HR:           93 bpm. Exam Location:  Inpatient Procedure: 2D Echo, Cardiac Doppler, Color Doppler and Intracardiac            Opacification Agent Indications:    Stroke  History:        Patient has prior history of Echocardiogram examinations, most                  recent 06/13/2019.  Sonographer:    Clayton Lefort RDCS (AE) Referring Phys: [id number] Derek Jack  Sonographer Comments: Technically difficult study due to poor echo windows and no subcostal window. Image acquisition challenging due to respiratory motion. Patient coughing throughout test. IMPRESSIONS  1. Left ventricular ejection fraction, by estimation, is 65 to 70%. The left ventricle has normal function. The left ventricle has no regional wall motion abnormalities. There is moderate left ventricular hypertrophy. Left ventricular diastolic parameters are indeterminate.  2. Right ventricular systolic function is normal. The right ventricular size is normal.  3. The mitral valve is normal in structure. No evidence of mitral valve regurgitation. No evidence of mitral stenosis.  4. The aortic valve is normal in structure. Aortic valve regurgitation is not visualized. No aortic stenosis is present.  5. The inferior vena cava is normal in size with greater than 50% respiratory variability, suggesting right atrial pressure of 3 mmHg. Comparison(s): Prior ascending aorta 42-43 mm. Unable to visual in current study. Conclusion(s)/Recommendation(s): No intracardiac source of embolism detected on this transthoracic study. Consider a transesophageal echocardiogram to exclude cardiac source of embolism if clinically indicated. FINDINGS  Left Ventricle: Left ventricular ejection fraction, by estimation, is 65 to 70%. The left ventricle has normal function. The left ventricle has no regional wall motion abnormalities. The left ventricular internal cavity size was normal in size. There is  moderate left ventricular hypertrophy. Left ventricular diastolic parameters are indeterminate. Right Ventricle: The right ventricular size is normal. No increase in right ventricular wall thickness. Right ventricular systolic function is normal. Left Atrium: Left atrial size was normal in size. Right Atrium: Right atrial size was normal in size.  Pericardium: There is no evidence of pericardial effusion. Mitral Valve: The mitral valve is normal in structure. No evidence of mitral valve regurgitation. No evidence of mitral  valve stenosis. Tricuspid Valve: The tricuspid valve is normal in structure. Tricuspid valve regurgitation is not demonstrated. No evidence of tricuspid stenosis. Aortic Valve: The aortic valve is normal in structure. Aortic valve regurgitation is not visualized. No aortic stenosis is present. Aortic valve mean gradient measures 7.0 mmHg. Aortic valve peak gradient measures 14.1 mmHg. Aortic valve area, by VTI measures 3.49 cm. Pulmonic Valve: The pulmonic valve was normal in structure. Pulmonic valve regurgitation is not visualized. No evidence of pulmonic stenosis. Aorta: The aortic root is normal in size and structure. Venous: The inferior vena cava is normal in size with greater than 50% respiratory variability, suggesting right atrial pressure of 3 mmHg. IAS/Shunts: No atrial level shunt detected by color flow Doppler.  LEFT VENTRICLE PLAX 2D LVIDd:         4.70 cm LVIDs:         2.90 cm LV PW:         1.60 cm LV IVS:        1.80 cm LVOT diam:     2.30 cm LV SV:         108 LV SV Index:   49 LVOT Area:     4.15 cm  RIGHT VENTRICLE RV S prime:     17.40 cm/s TAPSE (M-mode): 2.4 cm LEFT ATRIUM           Index LA diam:      1.50 cm 0.69 cm/m LA Vol (A2C): 36.2 ml 16.64 ml/m LA Vol (A4C): 35.1 ml 16.13 ml/m  AORTIC VALVE AV Area (Vmax):    3.14 cm AV Area (Vmean):   3.59 cm AV Area (VTI):     3.49 cm AV Vmax:           188.00 cm/s AV Vmean:          126.000 cm/s AV VTI:            0.308 m AV Peak Grad:      14.1 mmHg AV Mean Grad:      7.0 mmHg LVOT Vmax:         142.00 cm/s LVOT Vmean:        109.000 cm/s LVOT VTI:          0.259 m LVOT/AV VTI ratio: 0.84  AORTA Ao Asc diam: 3.70 cm MITRAL VALVE MV Area (PHT): 2.87 cm     SHUNTS MV Decel Time: 264 msec     Systemic VTI:  0.26 m MV E velocity: 92.40 cm/s   Systemic Diam: 2.30 cm  MV A velocity: 111.00 cm/s MV E/A ratio:  0.83 David David [name] Electronically signed by David David [name]: 03/06/2021/2:18:39 PM    Final    US THYROID  Result Date: 03/08/2021 CLINICAL DATA:  Incidental on CT. EXAM: THYROID ULTRASOUND TECHNIQUE: Ultrasound examination of the thyroid gland and adjacent soft tissues was performed. COMPARISON:  03/05/2021, 02/27/2020 FINDINGS: Parenchymal Echotexture: Mildly heterogenous Isthmus: 0.6 cm, previously 1.3 cm Right lobe: 4.2 x 1.4 x 1.7 cm, previously 4.5 x 2.3 x 1.8 cm Left lobe: 3.9 x 1.8 x 1.6 cm, previously 4.8 x 1.8 x 2.1 cm _________________________________________________________ Estimated total number of nodules >/= 1 cm: 1 Number of spongiform nodules >/=  2 cm not described below (TR1): 0 Number of mixed cystic and solid nodules >/= 1.5 cm not described below (Eros): 0 _________________________________________________________ Nodule # 1: Prior biopsy: No Location: Right; Superior Maximum size: 0.7 cm; Other 2 dimensions: 0.6 x 0.5 cm, previously,  0.6 x 0.6 x 0.5 cm Composition: cannot determine (2) Echogenicity: hypoechoic (2) Shape: not taller-than-wide (0) Margins: ill-defined (0) Echogenic foci: peripheral calcifications (2) ACR TI-RADS total points: 6. ACR TI-RADS risk category:  TR4 (4-6 points). Significant change in size (>/= 20% in two dimensions and minimal increase of 2 mm): No Change in features: No Change in ACR TI-RADS risk category: No ACR TI-RADS recommendations: Given size (<0.9 cm) and appearance, this nodule does NOT meet TI-RADS criteria for biopsy or dedicated follow-up. _________________________________________________________ Nodule # 2: Prior biopsy: No Location: Right; Inferior Maximum size: 1.1 cm; Other 2 dimensions: 1.0 x 0.9 cm, previously, 1.1 x 1.0 x 0.9 cm Composition: solid/almost completely solid (2) Echogenicity: hypoechoic (2) Shape: not taller-than-wide (0) Margins: smooth (0) Echogenic foci: none (0) ACR  TI-RADS total points: 4. ACR TI-RADS risk category:  TR4 (4-6 points). Significant change in size (>/= 20% in two dimensions and minimal increase of 2 mm): No Change in features: No Change in ACR TI-RADS risk category: No ACR TI-RADS recommendations: *Given size (>/= 1 - 1.4 cm) and appearance, a follow-up ultrasound in 1 year should be considered based on TI-RADS criteria. _________________________________________________________ No cervical lymphadenopathy identified. IMPRESSION: 1. The right paratracheal nodule described on recent CT head neck is not visualized sonographically. In retrospect, this CT finding is similar to 06/13/2013 comparison chest CT and appears extrathyroidal. 2. Unchanged appearance of right inferior solid thyroid nodule (labeled 2, 1.1 cm) which again meets criteria (TI-RADS category 4) for 1 year ultrasound surveillance. The above is in keeping with the ACR TI-RADS recommendations - J Am Coll Radiol 2017;14:587-595. David Cancer, David [name] [facility] Electronically Signed   By: David David [name] M.D.   On: 03/08/2021 08:19    Scheduled Meds:  aspirin EC  81 mg Oral Daily   atorvastatin  80 mg Oral Daily   benzonatate  100 mg Oral TID   Chlorhexidine Gluconate Cloth  6 each Topical Q0600   dextromethorphan  30 mg Oral TID   DULoxetine  30 mg Oral Daily   enoxaparin (LOVENOX) injection  40 mg Subcutaneous Q24H   ezetimibe  10 mg Oral Daily   guaiFENesin  600 mg Oral BID   mouth rinse  15 mL Mouth Rinse BID   pantoprazole  40 mg Oral BID   sodium chloride flush  3 mL Intravenous Once   ticagrelor  90 mg Oral BID   Continuous Infusions:  ampicillin-sulbactam (UNASYN) IV 3 g (03/08/21 0630)     LOS: 3 days    Patrecia Pour, David [name] [facility] [email or web address] 03/08/2021, 12:46 PM

## 2021-03-08 NOTE — Evaluation (Signed)
Clinical/Bedside Swallow Evaluation Patient Details  Name: David [name] MRN: [id number] Date of Birth: 03/20/1940  Today's Date: 03/08/2021 Time: SLP Start Time (ACUTE ONLY): 1045 SLP Stop Time (ACUTE ONLY): 1110 SLP Time Calculation (min) (ACUTE ONLY): 25 min  Past Medical History:  Past Medical History:  Diagnosis Date   Allergy    Arthritis    gen.  and left hip   BPH (benign prostatic hypertrophy)    Coronary artery disease    GERD (gastroesophageal reflux disease)    occasional   History of kidney stones    2007   Hyperlipidemia    Hypertension    Kidney tumor    LBP (low back pain) 06/2005   L5 radicular symptoms; MRI of LS spine severe spondylosis at L4-L5 with central cancal stenosis    Nocturia    Organic impotence    Prostate cancer ([place]) 10/2011   (Low grade) [facility] - Dr. Junious Silk   Pyelonephritis 03/05/2013   Renal oncocytoma of right kidney 01/23/2013   As 3.2 x 3.5 cm enhancing exophytic mass projecting off the lower pole. This is consistent with a renal cell carcinoma. No retroperitoneal lymphadenopathy.    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   Wears glasses    Past Surgical History:  Past Surgical History:  Procedure Laterality Date   COLONOSCOPY  02/15/2006, 2014   Internal hemorrhoids (Dr [name]), last in 2014   [place] Right 1980   IR ANGIO INTRA EXTRACRAN SEL INTERNAL CAROTID UNI R MOD SED  03/05/2021   IR INTRAVSC STENT CERV CAROTID W/EMB-PROT MOD SED INCL ANGIO  03/05/2021   IR US GUIDE VASC ACCESS RIGHT  03/05/2021   LYMPHADENECTOMY Bilateral 02/15/2013   Procedure: LYMPHADENECTOMY WITH INDOCYANINE GREEN DYE INJECTION;  Surgeon: [name], MD;  Location: [facility];  Service: Urology;  Laterality: Bilateral;   PROSTATE BIOPSY N/A 12/18/2012   Procedure: BIOPSY TRANSRECTAL ULTRASONIC PROSTATE (TUBP);  Surgeon: [name], MD;  Location: [facility];  Service: Urology;  Laterality: N/A;   PROSTATE  BIOPSY  12/05/11   gleason 6, 3/12 cores   RADIOLOGY WITH ANESTHESIA N/A 03/05/2021   Procedure: IR WITH ANESTHESIA;  Surgeon: Radiologist, Medication, MD;  Location: [place];  Service: Radiology;  Laterality: N/A;   REPAIR UMBILICAL AND VENTRAL HERNIA'S W/ MESH  08-21-2009   RIGHT/LEFT HEART CATH AND CORONARY ANGIOGRAPHY N/A 08/06/2019   Procedure: RIGHT/LEFT HEART CATH AND CORONARY ANGIOGRAPHY;  Surgeon: [name], Peter M, MD;  Location: [place] [facility];  Service: Cardiovascular;  Laterality: N/A;   ROBOT ASSISTED LAPAROSCOPIC RADICAL PROSTATECTOMY N/A 02/15/2013   Procedure: ROBOTIC ASSISTED LAPAROSCOPIC RADICAL PROSTATECTOMY;  Surgeon: [name], MD;  Location: [facility];  Service: Urology;  Laterality: N/A;   ROBOTIC ASSITED PARTIAL NEPHRECTOMY Right 02/15/2013   Procedure: ROBOTIC ASSITED PARTIAL NEPHRECTOMY;  Surgeon: [name], MD;  Location: [facility];  Service: Urology;  Laterality: Right;   SPERMATOCELECTOMY Left 12/18/2012   Procedure: LEFT SPERMATOCELECTOMY;  Surgeon: [name], MD;  Location[facility];  Service: Urology;  Laterality: Left;   TONSILLECTOMY AND ADENOIDECTOMY  as child   TOTAL KNEE ARTHROPLASTY Left 05/26/2020   Procedure: TOTAL KNEE ARTHROPLASTY;  Surgeon: Paralee Cancel, MD;  Location: [facility];  Service: Orthopedics;  Laterality: Left;  70 mins   TRANSURETHRAL RESECTION OF PROSTATE  AGE 66   HPI:  Pt is a 81 y/o male presented to ED on 03/05/21 for L sided weakness. Recent flue dx. CTA showed  R ICA occlusion. S/p cerebral angiogram and R carotid stenting on 1/6. MRI showed multifocal acute/early subacute cortical ischemia in anterior R MCA and small focus of acute/early subacute ischemia at L caudate head. Pt evaluated by SLP for dysarthria, MD then ordered swallow eval due to persistent pna and complaint of coughing while eating. PMH: HTN, CAD, prostate cancer    Assessment / Plan / Recommendation  Clinical Impression  Pt demonstrates  immediate and delayed coughing as well as coughing prior to PO administration. Family does report that coughing is more frequent during PO consumption. Instrumental assessment is needed to assess pts swallowing further. Will plan for MBS tomorrow; hopefully pt will be more alert. Pt can continue current diet and this time. SLP Visit Diagnosis: Dysphagia, unspecified (R13.10)    Aspiration Risk  Mild aspiration risk    Diet Recommendation Regular;Thin liquid   Liquid Administration via: Cup;Straw Medication Administration: Whole meds with liquid Supervision: Patient able to self feed    Other  Recommendations Oral Care Recommendations: Oral care BID    Recommendations for follow up therapy are one component of a multi-disciplinary discharge planning process, led by the attending physician.  Recommendations may be updated based on patient status, additional functional criteria and insurance authorization.  Follow up Recommendations Acute inpatient rehab (3hours/day)      Assistance Recommended at Discharge    Functional Status Assessment    Frequency and Duration            Prognosis        Swallow Study   General HPI: Pt is a 81 y/o male presented to ED on 03/05/21 for L sided weakness. Recent flue dx. CTA showed R ICA occlusion. S/p cerebral angiogram and R carotid stenting on 1/6. MRI showed multifocal acute/early subacute cortical ischemia in anterior R MCA and small focus of acute/early subacute ischemia at L caudate head. Pt evaluated by SLP for dysarthria, MD then ordered swallow eval due to persistent pna and complaint of coughing while eating. PMH: HTN, CAD, prostate cancer Type of Study: Bedside Swallow Evaluation Diet Prior to this Study: Regular;Thin liquids Temperature Spikes Noted: No Respiratory Status: Nasal cannula History of Recent Intubation: No Behavior/Cognition: Lethargic/Drowsy;Requires cueing Oral Care Completed by SLP: No Oral Cavity - Dentition: Adequate  natural dentition Self-Feeding Abilities: Needs assist Patient Positioning: Upright in bed Baseline Vocal Quality: Normal Volitional Cough: Strong;Congested Volitional Swallow: Able to elicit    Oral/Motor/Sensory Function Overall Oral Motor/Sensory Function: Mild impairment Facial ROM: Reduced left;Suspected CN VII (facial) dysfunction Facial Symmetry: Abnormal symmetry left;Suspected CN VII (facial) dysfunction Facial Strength: Reduced left;Suspected CN VII (facial) dysfunction Lingual ROM: Reduced right;Reduced left Lingual Symmetry: Within Functional Limits Lingual Strength: Reduced Mandible: Within Functional Limits   Ice Chips Ice chips: Within functional limits   Thin Liquid Thin Liquid: Impaired Presentation: Cup Pharyngeal  Phase Impairments: Cough - Immediate    Nectar Thick Nectar Thick Liquid: Not tested   Honey Thick Honey Thick Liquid: Not tested   Puree Puree: Not tested   Solid     Solid: Not tested      Lynann Beaver 03/08/2021,12:05 PM

## 2021-03-08 NOTE — Progress Notes (Addendum)
Inpatient Rehabilitation Admissions Coordinator   I met at bedside with patient , wife and two daughters. We discussed goals and expectations of a possible CIR admit. They prefer CIR admit. I will begin Auth with [facility] for a possible CIR admit. Noted began on [employer] for possible PNA.  Danne Baxter, RN, MSN Rehab Admissions Coordinator [phone] 03/08/2021 10:48 AM

## 2021-03-08 NOTE — Progress Notes (Signed)
Physical Therapy Treatment Patient Details Name: David [name] MRN: [id number] DOB: Jul 15, 1940 Today's Date: 03/08/2021   History of Present Illness 81 y/o male presented to ED on 03/05/21 for L sided weakness. Recent flue dx. CTA showed R ICA occlusion. S/p cerebral angiogram and R carotid stenting on 1/6. MRI showed multifocal acute/early subacute cortical ischemia in anterior R MCA and small focus of acute/early subacute ischemia at L caudate head. PMH: HTN, CAD, prostate cancer    PT Comments    Patient progressing this session able to ambulate in the room with assist, but with poor activity tolerance, decreased balance, decreased L side strength and decreased postural support.  He was coughing a lot in the bed, but some improvement up in chair end of session.  Patient remains appropriate for acute inpatient rehab at d/c.  PT will continue to follow acutely.    Recommendations for follow up therapy are one component of a multi-disciplinary discharge planning process, led by the attending physician.  Recommendations may be updated based on patient status, additional functional criteria and insurance authorization.  Follow Up Recommendations  Acute inpatient rehab (3hours/day)     Assistance Recommended at Discharge Frequent or constant Supervision/Assistance  Patient can return home with the following A lot of help with bathing/dressing/bathroom;Assistance with cooking/housework;Assistance with feeding;Direct supervision/assist for medications management;Direct supervision/assist for financial management;Assist for transportation;Help with stairs or ramp for entrance;A little help with walking and/or transfers   Equipment Recommendations  Other (comment) (TBA)    Recommendations for Other Services       Precautions / Restrictions Precautions Precautions: Fall Precaution Comments: L hemiparesis     Mobility  Bed Mobility Overal bed mobility: Needs Assistance Bed Mobility: Supine  to Sit           General bed mobility comments: using R rail and able to get legs off bed, but difficulty lifting trunk due to proximity to rail, assist to lift trunk and scoot L hip with cues    Transfers Overall transfer level: Needs assistance Equipment used: Rolling walker (2 wheels) Transfers: Sit to/from Stand Sit to Stand: Min assist;From elevated surface                Ambulation/Gait Ambulation/Gait assistance: Min Web designer (Feet): 30 Feet Assistive device: Rolling walker (2 wheels) Gait Pattern/deviations: Step-to pattern;Step-through pattern;Decreased stride length;Trunk flexed;Decreased dorsiflexion - left;Decreased step length - left       General Gait Details: assist for L hand on RW and for walker management in small space in room, assist for balance, cues for posture and walker safety   Stairs             Wheelchair Mobility    Modified Rankin (Stroke Patients Only) Modified Rankin (Stroke Patients Only) Pre-Morbid Rankin Score: No symptoms Modified Rankin: Moderately severe disability     Balance   Sitting-balance support: Feet supported Sitting balance-Leahy Scale: Fair     Standing balance support: Bilateral upper extremity supported Standing balance-Leahy Scale: Poor                              Cognition Arousal/Alertness: Awake/alert Behavior During Therapy: Flat affect Overall Cognitive Status: Impaired/Different from baseline Area of Impairment: Attention;Following commands                   Current Attention Level: Selective   Following Commands: Follows one step commands consistently;Follows one step commands with increased time Safety/Judgement:  Decreased awareness of safety   Problem Solving: Slow processing          Exercises      General Comments General comments (skin integrity, edema, etc.): coughing throughout small amount of sputum, RN in to do [place] screening initially,  wife in the room. HR 87 with mobility and pt on room air throughout      Pertinent Vitals/Pain Pain Assessment: No/denies pain    Home Living                          Prior Function            PT Goals (current goals can now be found in the care plan section) Progress towards PT goals: Progressing toward goals    Frequency    Min 4X/week      PT Plan Current plan remains appropriate    Co-evaluation              AM-PAC PT "6 Clicks" Mobility   Outcome Measure  Help needed turning from your back to your side while in a flat bed without using bedrails?: A Little Help needed moving from lying on your back to sitting on the side of a flat bed without using bedrails?: A Little Help needed moving to and from a bed to a chair (including a wheelchair)?: A Little Help needed standing up from a chair using your arms (e.g., wheelchair or bedside chair)?: A Little Help needed to walk in hospital room?: A Lot Help needed climbing 3-5 steps with a railing? : Total 6 Click Score: 15    End of Session Equipment Utilized During Treatment: Gait belt Activity Tolerance: Patient limited by fatigue Patient left: in chair;with call bell/phone within reach;with chair alarm set;with family/visitor present (MD in the room)   PT Visit Diagnosis: Other abnormalities of gait and mobility (R26.89);Hemiplegia and hemiparesis;Muscle weakness (generalized) (M62.81);Other symptoms and signs involving the nervous system (R29.898) Hemiplegia - Right/Left: Left Hemiplegia - dominant/non-dominant: Non-dominant Hemiplegia - caused by: Cerebral infarction     Time: 1208-1236 PT Time Calculation (min) (ACUTE ONLY): 28 min  Charges:  $Gait Training: 8-22 mins $Therapeutic Activity: 8-22 mins                     Magda Kiel, PT Acute Rehabilitation Services Pager:[phone] Office:[phone] 03/08/2021    Reginia Naas 03/08/2021, 1:08 PM

## 2021-03-08 NOTE — Progress Notes (Signed)
STROKE TEAM PROGRESS NOTE   INTERVAL HISTORY Wife is at the bedside.  Pt just worked with PT and worked with walker in home. Then sat pt down to chair. Pt awake alert but lethargic, still has very mild left facial droop and mild LUE weakness but improving. Still has frequent cough, also cough with food, will do MBS tomorrow. Leukocytosis improved some, on Abx, appreciate help from hospitalist service  OBJECTIVE Vitals:   03/07/21 2030 03/08/21 0022 03/08/21 0427 03/08/21 0800  BP: (!) 147/64 140/67 128/66 (!) 140/55  Pulse: 77 94 60 (!) 51  Resp: 20 (!) 22 20 17   Temp: 97.6 F (36.4 C) 99.1 F (37.3 C) 98.9 F (37.2 C) (!) 97.3 F (36.3 C)  TempSrc: Oral Axillary Axillary Oral  SpO2: 97% 98% 96% 95%  Weight:        CBC:  Recent Labs  Lab 03/05/21 1420 03/06/21 0515 03/07/21 0137 03/08/21 0350  WBC 12.5*   < > 19.2* 18.3*  NEUTROABS 9.0*  --   --  16.0*  HGB 11.8*   < > 10.8* 11.0*  HCT 35.7*   < > 32.4* 33.2*  MCV 89.9   < > 88.3 88.5  PLT 208   < > 226 260   < > = values in this interval not displayed.    Basic Metabolic Panel:  Recent Labs  Lab 03/07/21 0137 03/08/21 0350  NA 139 139  K 3.6 3.3*  CL 113* 108  CO2 18* 22  GLUCOSE 125* 115*  BUN 22 21  CREATININE 1.00 1.08  CALCIUM 9.5 9.4    Lipid Panel:     Component Value Date/Time   CHOL 77 03/06/2021 0515   CHOL 172 11/18/2020 0843   TRIG 78 03/06/2021 0515   HDL 15 (L) 03/06/2021 0515   HDL 48 11/18/2020 0843   CHOLHDL 5.1 03/06/2021 0515   VLDL 16 03/06/2021 0515   LDLCALC 46 03/06/2021 0515   LDLCALC 106 (H) 11/18/2020 0843   HgbA1c:  Lab Results  Component Value Date   HGBA1C 6.3 (H) 03/06/2021   Urine Drug Screen: No results found for: LABOPIA, COCAINSCRNUR, LABBENZ, AMPHETMU, THCU, LABBARB  Alcohol Level No results found for: [facility]  IMAGING  DG CHEST PORT 1 VIEW  Result Date: 03/07/2021 CLINICAL DATA:  Respiratory abnormalities. EXAM: PORTABLE CHEST 1 VIEW COMPARISON:  Radiograph  02/26/2021 FINDINGS: Lung volumes are low. Stable heart size and mediastinal contours. Patchy opacity in both lower lung zones. Mild additional patchy opacity in the right suprahilar lung. Vascular congestion. No pleural effusion. Anti lordotic positioning. IMPRESSION: 1. Low lung volumes with patchy bibasilar and right suprahilar opacities, may represent multifocal pneumonia, particularly at the right lung base. 2. Vascular congestion. Electronically Signed   By: Keith Rake M.D.   On: 03/07/2021 17:35   ECHOCARDIOGRAM COMPLETE  Result Date: 03/06/2021    ECHOCARDIOGRAM REPORT   Patient Name:   David [name] Date of Exam: 03/06/2021 Medical Rec #:  [id number]         Height:       72.0 in Accession #:    [id number]        Weight:       210.1 lb Date of Birth:  [birth year]          BSA:          2.176 m Patient Age:    38 years          BP:  143/66 mmHg Patient Gender: M                 HR:           93 bpm. Exam Location:  Inpatient Procedure: 2D Echo, Cardiac Doppler, Color Doppler and Intracardiac            Opacification Agent Indications:    Stroke  History:        Patient has prior history of Echocardiogram examinations, most                 recent 06/13/2019.  Sonographer:    Clayton Lefort RDCS (AE) Referring Phys: [id number] Derek Jack  Sonographer Comments: Technically difficult study due to poor echo windows and no subcostal window. Image acquisition challenging due to respiratory motion. Patient coughing throughout test. IMPRESSIONS  1. Left ventricular ejection fraction, by estimation, is 65 to 70%. The left ventricle has normal function. The left ventricle has no regional wall motion abnormalities. There is moderate left ventricular hypertrophy. Left ventricular diastolic parameters are indeterminate.  2. Right ventricular systolic function is normal. The right ventricular size is normal.  3. The mitral valve is normal in structure. No evidence of mitral valve regurgitation. No evidence of  mitral stenosis.  4. The aortic valve is normal in structure. Aortic valve regurgitation is not visualized. No aortic stenosis is present.  5. The inferior vena cava is normal in size with greater than 50% respiratory variability, suggesting right atrial pressure of 3 mmHg. Comparison(s): Prior ascending aorta 42-43 mm. Unable to visual in current study. Conclusion(s)/Recommendation(s): No intracardiac source of embolism detected on this transthoracic study. Consider a transesophageal echocardiogram to exclude cardiac source of embolism if clinically indicated. FINDINGS  Left Ventricle: Left ventricular ejection fraction, by estimation, is 65 to 70%. The left ventricle has normal function. The left ventricle has no regional wall motion abnormalities. The left ventricular internal cavity size was normal in size. There is  moderate left ventricular hypertrophy. Left ventricular diastolic parameters are indeterminate. Right Ventricle: The right ventricular size is normal. No increase in right ventricular wall thickness. Right ventricular systolic function is normal. Left Atrium: Left atrial size was normal in size. Right Atrium: Right atrial size was normal in size. Pericardium: There is no evidence of pericardial effusion. Mitral Valve: The mitral valve is normal in structure. No evidence of mitral valve regurgitation. No evidence of mitral valve stenosis. Tricuspid Valve: The tricuspid valve is normal in structure. Tricuspid valve regurgitation is not demonstrated. No evidence of tricuspid stenosis. Aortic Valve: The aortic valve is normal in structure. Aortic valve regurgitation is not visualized. No aortic stenosis is present. Aortic valve mean gradient measures 7.0 mmHg. Aortic valve peak gradient measures 14.1 mmHg. Aortic valve area, by VTI measures 3.49 cm. Pulmonic Valve: The pulmonic valve was normal in structure. Pulmonic valve regurgitation is not visualized. No evidence of pulmonic stenosis. Aorta: The  aortic root is normal in size and structure. Venous: The inferior vena cava is normal in size with greater than 50% respiratory variability, suggesting right atrial pressure of 3 mmHg. IAS/Shunts: No atrial level shunt detected by color flow Doppler.  LEFT VENTRICLE PLAX 2D LVIDd:         4.70 cm LVIDs:         2.90 cm LV PW:         1.60 cm LV IVS:        1.80 cm LVOT diam:     2.30 cm LV SV:  108 LV SV Index:   49 LVOT Area:     4.15 cm  RIGHT VENTRICLE RV S prime:     17.40 cm/s TAPSE (M-mode): 2.4 cm LEFT ATRIUM           Index LA diam:      1.50 cm 0.69 cm/m LA Vol (A2C): 36.2 ml 16.64 ml/m LA Vol (A4C): 35.1 ml 16.13 ml/m  AORTIC VALVE AV Area (Vmax):    3.14 cm AV Area (Vmean):   3.59 cm AV Area (VTI):     3.49 cm AV Vmax:           188.00 cm/s AV Vmean:          126.000 cm/s AV VTI:            0.308 m AV Peak Grad:      14.1 mmHg AV Mean Grad:      7.0 mmHg LVOT Vmax:         142.00 cm/s LVOT Vmean:        109.000 cm/s LVOT VTI:          0.259 m LVOT/AV VTI ratio: 0.84  AORTA Ao Asc diam: 3.70 cm MITRAL VALVE MV Area (PHT): 2.87 cm     SHUNTS MV Decel Time: 264 msec     Systemic VTI:  0.26 m MV E velocity: 92.40 cm/s   Systemic Diam: 2.30 cm MV A velocity: 111.00 cm/s MV E/A ratio:  0.83 Candee Furbish MD Electronically signed by Candee Furbish MD Signature Date/Time: 03/06/2021/2:18:39 PM    Final    US THYROID  Result Date: 03/08/2021 CLINICAL DATA:  Incidental on CT. EXAM: THYROID ULTRASOUND TECHNIQUE: Ultrasound examination of the thyroid gland and adjacent soft tissues was performed. COMPARISON:  03/05/2021, 02/27/2020 FINDINGS: Parenchymal Echotexture: Mildly heterogenous Isthmus: 0.6 cm, previously 1.3 cm Right lobe: 4.2 x 1.4 x 1.7 cm, previously 4.5 x 2.3 x 1.8 cm Left lobe: 3.9 x 1.8 x 1.6 cm, previously 4.8 x 1.8 x 2.1 cm _________________________________________________________ Estimated total number of nodules >/= 1 cm: 1 Number of spongiform nodules >/=  2 cm not described below  (TR1): 0 Number of mixed cystic and solid nodules >/= 1.5 cm not described below ([place]): 0 _________________________________________________________ Nodule # 1: Prior biopsy: No Location: Right; Superior Maximum size: 0.7 cm; Other 2 dimensions: 0.6 x 0.5 cm, previously, 0.6 x 0.6 x 0.5 cm Composition: cannot determine (2) Echogenicity: hypoechoic (2) Shape: not taller-than-wide (0) Margins: ill-defined (0) Echogenic foci: peripheral calcifications (2) ACR TI-RADS total points: 6. ACR TI-RADS risk category:  TR4 (4-6 points). Significant change in size (>/= 20% in two dimensions and minimal increase of 2 mm): No Change in features: No Change in ACR TI-RADS risk category: No ACR TI-RADS recommendations: Given size (<0.9 cm) and appearance, this nodule does NOT meet TI-RADS criteria for biopsy or dedicated follow-up. _________________________________________________________ Nodule # 2: Prior biopsy: No Location: Right; Inferior Maximum size: 1.1 cm; Other 2 dimensions: 1.0 x 0.9 cm, previously, 1.1 x 1.0 x 0.9 cm Composition: solid/almost completely solid (2) Echogenicity: hypoechoic (2) Shape: not taller-than-wide (0) Margins: smooth (0) Echogenic foci: none (0) ACR TI-RADS total points: 4. ACR TI-RADS risk category:  TR4 (4-6 points). Significant change in size (>/= 20% in two dimensions and minimal increase of 2 mm): No Change in features: No Change in ACR TI-RADS risk category: No ACR TI-RADS recommendations: *Given size (>/= 1 - 1.4 cm) and appearance, a follow-up ultrasound in 1 year should be considered  based on TI-RADS criteria. _________________________________________________________ No cervical lymphadenopathy identified. IMPRESSION: 1. The right paratracheal nodule described on recent CT head neck is not visualized sonographically. In retrospect, this CT finding is similar to 06/13/2013 comparison chest CT and appears extrathyroidal. 2. Unchanged appearance of right inferior solid thyroid nodule (labeled 2,  1.1 cm) which again meets criteria (TI-RADS category 4) for 1 year ultrasound surveillance. The above is in keeping with the ACR TI-RADS recommendations - J Am Coll Radiol 2017;14:587-595. Ruthann Cancer, MD [facility] Electronically Signed   By: Ruthann Cancer M.D.   On: 03/08/2021 08:19    ECG - SR rate 83 BPM. (Computer read as atrial fibrillation) (See cardiology reading for complete details)  PHYSICAL EXAM  Temp:  [97.3 F (36.3 C)-99.1 F (37.3 C)] 97.3 F (36.3 C) (01/09 0800) Pulse Rate:  [51-94] 51 (01/09 0800) Resp:  [17-22] 17 (01/09 0800) BP: (128-147)/(55-67) 140/55 (01/09 0800) SpO2:  [95 %-98 %] 95 % (01/09 0800)  General - Well nourished, well developed, in no apparent distress, mildly lethargic.  Ophthalmologic - fundi not visualized due to noncooperation.  Cardiovascular - Regular rhythm and rate, except with frequent PVCs on tele.  Neuro - awake, alert but lethargic, eyes open, orientated to age, place, time and people. No aphasia, fluent language but paucity of speech, following all simple commands. Able to name and repeat. No gaze palsy, tracking bilaterally, visual field full, PERRL. Right mild facial droop. Tongue midline. RUE 4/5 proximal and distal, LUE pronator drift proximally and distal finger grip and finger movement 3/5. Bilaterally LEs 3/5 proximal, and 5/5 distally. Sensation symmetrical bilaterally, b/l FTN intact although slow on the left proportional to the weakness, gait not tested.     ASSESSMENT/PLAN David [name] is a 81 y.o. male with history of coronary artery disease, hyperlipidemia, hypertension, remote history of prostate cancer, renal oncocytoma of right kidney in 2014 presenting with left sided weakness, dysarthria and visual neglect.  He did not receive TNKase due to unknown time of onset but found to have right ICA critical stenosis s/p Interventional Radiology with right  CAS.  Stroke: Multifocal acute/early subacute cortical ischemia in the anterior right MCA territory, likely large vessel source from critical R ICA stenosis s/p right ICA stenting. Left caudate small infarct could be procedure related vs. Occult afib CT Head - No acute intracranial pathology. ASPECTS is 10.  CTA Neck - Prominent irregular hypodensity within the proximal right ICA. A portion of this hypodensity likely reflects soft plaque. However, the irregular portion is highly suspicious for superimposed thrombus (or unstable plaque). Resultant severe stenosis of the proximal right ICA estimated to be 80%.  CTA Head - Moderate/severe stenosis within a superior division mid M2 right MCA vessel. Moderate stenosis within the A4 left anterior cerebral artery. Moderate stenosis of the paraclinoid right ICA. IR with right ICA critical stenosis s/p CAS MRI head - Multifocal acute/early subacute cortical ischemia in the anterior right MCA territory. No hemorrhage or mass effect. Small focus of acute/early subacute ischemia at the left caudate head. 2D Echo -EF 65 to 70% Discussed with Dr. Stanford Breed, recommend Loop recorder to rule out afib Lacey Jensen Virus 2 - negative 02/26/21 LDL - 106 HgbA1c - 6.3 VTE prophylaxis - Lovenox 40 mg daily  aspirin 81 mg daily prior to admission, now on aspirin 81 mg daily and Brilinta (ticagrelor) 90 mg bid post stent Patient will be counseled to be compliant with his antithrombotic medications Ongoing  aggressive stroke risk factor management Therapy recommendations:  CIR Disposition:  Pending  R carotid stenosis CTA Neck - Prominent irregular hypodensity within the proximal right ICA. A portion of this hypodensity likely reflects soft plaque. However, the irregular portion is highly suspicious for superimposed thrombus (or unstable plaque). Resultant severe stenosis of the proximal right ICA estimated to be 80%.  Likely the cause of current stroke S/p emergent RICA  stenting On ASA and brilinta  Flu Sinusitis  ? Pneumonia ? aspiration Flu A positive 02/26/21 -> now negative on repeat CT head - Layering fluid in the maxillary and sphenoid sinuses which can be seen with acute sinusitis in the correct clinical setting. On mucinex and tessalon  Rrespiratory panel negative for flu and COVID Leukocytosis WBC 12.9-19.2->18.3 Appreciate help from hospitalist service.    Frequent PVCs Frequent PVCs on tele MRI did show left CR small infarcts, not sure if related to procedure or cardioembolic Follows with Dr. Boris Lown as outpt, discussed with Dr. Stanford Breed, will do loop recorder on discharge.  Hypertension Home BP meds: Benicar 40 mg daily Stable BP < 180/105 Long-term BP goal normotensive  Hyperlipidemia Home Lipid lowering medication: Lipitor 80 mg daily / Zetia 10 mg daily LDL 106, goal < 70 Current lipid lowering medication: Lipitor 80 mg daily / Zetia 10 mg daily Continue statin at discharge  Dysphagia  Cough due to pneumonia but also with food Speech on board MBS tomorrow  Other Stroke Risk Factors Advanced age Former cigarette smoker - quit ETOH use, advised to drink no more than 1 alcoholic beverage per day. Overweight, Body mass index is 28.49 kg/m., recommend weight loss, diet and exercise as appropriate  Coronary artery disease  Other Active Problems, Findings, Recommendations and/or Plan Code status - Full code CKD - stage 3a - creatinine - 1.20->1.26->0.99 Mild Leukocytosis - WBC's - 12.5 (afebrile) ->12.9 CTA Neck - 3 cm exophytic nodule extending inferiorly from the right thyroid lobe. This nodule was not described on the prior thyroid ultrasound of 02/27/2020. Thyroid ultrasound showed this nodule is likely extra-thyroidal Aortic Atherosclerosis (ICD10-I70.0)   Hospital day # 3   Rosalin Hawking, MD PhD Stroke Neurology 03/08/2021 2:12 PM      To contact Stroke Continuity provider, please refer to [email or web address] After  hours, contact General Neurology

## 2021-03-08 NOTE — Progress Notes (Signed)
Referring Physician(s): Dr. Erlinda Hong  Supervising Physician: [name]  Patient Status:  [facility] - In-pt  Chief Complaint: Code stroke, s/p right carotid stent placement with Dr. Karenann Cai on 03/05/21.   Subjective: Patient sitting up in chair.  Alert and answering questions.  Remains weak, but subjectively improved this AM.  Family at bedside.   Allergies: Codeine, Ace inhibitors, Amlodipine, Hydrocodone, and Percocet [oxycodone-acetaminophen]  Medications: Prior to Admission medications   Medication Sig Start Date End Date Taking? Authorizing Provider  acetaminophen (TYLENOL) 500 MG tablet Take 500-1,000 mg by mouth every 6 (six) hours as needed for moderate pain.   Yes [provider]  albuterol (VENTOLIN HFA) 108 (90 Base) MCG/ACT inhaler Inhale 2 puffs into the lungs every 6 (six) hours as needed for wheezing or shortness of breath (Cough). 02/23/21  Yes Lynden Oxford Scales, PA-C  aspirin EC 81 MG tablet Take 1 tablet (81 mg total) by mouth daily. 10/24/20 10/24/21 Yes Plotnikov, [name], MD  atorvastatin (LIPITOR) 80 MG tablet TAKE 1 TABLET(80 MG) BY MOUTH DAILY AT 6 PM 12/03/20  Yes Crenshaw, Denice Bors, MD  cetirizine (ZYRTEC) 10 MG tablet Take 1 tablet (10 mg total) by mouth daily. 02/23/21  Yes Lynden Oxford Scales, PA-C  DULoxetine (CYMBALTA) 30 MG capsule Take 30 mg by mouth daily.   Yes [provider]  ezetimibe (ZETIA) 10 MG tablet Take 10 mg by mouth daily.   Yes [provider]  fluticasone (FLONASE) 50 MCG/ACT nasal spray Place 2 sprays into both nostrils daily. 02/23/21  Yes Lynden Oxford Scales, PA-C  olmesartan (BENICAR) 40 MG tablet Take 1 tablet (40 mg total) by mouth daily. 08/13/20  Yes Lelon Perla, MD  pantoprazole (PROTONIX) 40 MG tablet TAKE 1 TABLET BY MOUTH TWICE DAILY 11/10/20  Yes Plotnikov, [name], MD  Vitamin D, Cholecalciferol, 50 MCG (2000 UT) CAPS Take 2,000 Units by mouth daily.    Yes  [provider]  benzonatate (TESSALON) 200 MG capsule Take 1 capsule (200 mg total) by mouth 3 (three) times daily as needed for cough. Patient not taking: Reported on 03/05/2021 02/25/21   Plotnikov, [name], MD  chlorpheniramine-HYDROcodone (TUSSIONEX PENNKINETIC ER) 10-8 MG/5ML SUER Take 5 ml po bid x 1 week, then 2.5 ml bid x 1 week, then taper off and d/c Patient not taking: Reported on 03/05/2021 02/25/21   Plotnikov, [name], MD  ezetimibe (ZETIA) 10 MG tablet Take 1 tablet (10 mg total) by mouth daily. 11/30/20 02/28/21  Lelon Perla, MD  ipratropium (ATROVENT) 0.06 % nasal spray Place 2 sprays into both nostrils 4 (four) times daily. As needed for nasal congestion, runny nose Patient not taking: Reported on 03/05/2021 02/23/21   Lynden Oxford Scales, PA-C  oseltamivir (TAMIFLU) 75 MG capsule Take 1 capsule (75 mg total) by mouth every 12 (twelve) hours. Patient not taking: Reported on 03/05/2021 02/26/21   Fredia Sorrow, MD     Vital Signs: BP (!) 140/55 (BP Location: Right Arm)    Pulse (!) 51    Temp (!) 97.3 F (36.3 C) (Oral)    Resp 17    Wt 210 lb 1.6 oz (95.3 kg)    SpO2 95%    BMI 28.49 kg/m   Physical Exam NAD, alert, on 3L [place] Groin: soft, non-tender. No evidence of pseudoaneurysm or hematoma.  Neuro: alert, conversant.  Answers questions appropriately. Follows commands. Very mild left facial droop. Residual left upper and lower extremity weakness.  Decreased left hand grip strength.    Imaging: CT ANGIO HEAD NECK W WO CM  Result Date: 03/05/2021 CLINICAL DATA:  Provided history: Neuro deficit, acute, stroke suspected. Left-sided weakness. EXAM: CT ANGIOGRAPHY HEAD AND NECK TECHNIQUE: Multidetector CT imaging of the head and neck was performed using the standard protocol during bolus administration of intravenous contrast. Multiplanar CT image reconstructions and MIPs were obtained to evaluate the vascular anatomy. Carotid stenosis measurements (when  applicable) are obtained utilizing NASCET criteria, using the distal internal carotid diameter as the denominator. CONTRAST:  14mL OMNIPAQUE IOHEXOL 350 MG/ML SOLN COMPARISON:  Noncontrast head CT performed earlier today 03/05/2021. Thyroid ultrasound 02/27/2020. FINDINGS: CTA NECK FINDINGS Aortic arch: Standard aortic branching. Atherosclerotic plaque within the visualized aortic arch and proximal major branch vessels of the neck. Atherosclerotic plaque within the visualized aortic arch and proximal major branch vessels of the neck. No hemodynamically significant innominate or proximal subclavian artery stenosis. Right carotid system: CCA and ICA patent within the neck. Prominent irregular hypodensity within the proximal right ICA. A portion of this likely reflects soft plaque. However, the irregular portion is highly suspicious for superimposed thrombus/unstable plaque (for instance as seen on series 9, image 61). Resultant severe stenosis of the proximal ICA estimated to be 80% (series 7, image 327). Minimal calcified plaque is also present about the carotid bifurcation. Left carotid system: CCA and ICA patent within neck without stenosis. Minimal atherosclerotic plaque about the carotid bifurcation and within the proximal ICA. Vertebral arteries: Vertebral arteries patent within the neck. Nonstenotic plaque at the origin of both vessels. The left vertebral artery is dominant. Skeleton: Partially imaged thoracic dextrocurvature. Cervical and upper thoracic spondylosis. Multilevel bridging ventral osteophytes within the imaged upper thoracic spine. No acute bony abnormality or aggressive osseous lesion. Other neck: No cervical lymphadenopathy. 3 cm exophytic nodule extending inferiorly from the right thyroid lobe in the right paratracheal region (for instance as seen on series 7, image 288). 8 mm nodule within the inferior right thyroid lobe. Upper chest: No consolidation within the imaged lung apices. Review of  the MIP images confirms the above findings CTA HEAD FINDINGS Anterior circulation: The intracranial internal carotid arteries are patent. Atherosclerotic plaque within both vessels. Moderate stenosis of the paraclinoid right ICA. Mild stenosis of the paraclinoid left ICA. The M1 middle cerebral arteries are patent. No M2 proximal branch occlusion is identified. Atherosclerotic irregularity of the M2 and more distal MCA vessels, bilaterally. Most notably, there is an apparent moderate/severe stenosis within superior division mid M2 right MCA vessel (series 12, image 17). The anterior cerebral arteries are patent. Hypoplastic left A1 segment. Atherosclerotic irregularity of both anterior cerebral arteries. Most notably, there is a moderate stenosis within the A4 left ACA. No intracranial aneurysm is identified. Posterior circulation: The intracranial vertebral arteries are patent. The basilar artery is patent. The posterior cerebral arteries are patent. Posterior communicating arteries are diminutive or absent bilaterally. Venous sinuses: Within the limitations of contrast timing, no convincing thrombus. Anatomic variants: As described. Review of the MIP images confirms the above findings CTA neck impression #1 and CTA head impressions #1 and #3 called by telephone at the time of interpretation on 03/05/2021 at 2:42 pm to provider Dr. Quinn Axe, who verbally acknowledged these results. IMPRESSION: CTA neck: 1. Prominent irregular hypodensity within the proximal right ICA. A portion of this hypodensity likely reflects soft plaque. However, the irregular portion is highly suspicious for superimposed thrombus (or unstable plaque). Resultant severe stenosis of the proximal right ICA estimated to  be 80%. 2. The left common carotid, left internal carotid and bilateral vertebral arteries are patent within the neck without significant stenosis. Mild atherosclerotic plaque within these vessels, as described. 3. 3 cm exophytic nodule  extending inferiorly from the right thyroid lobe. This nodule was not described on the prior thyroid ultrasound of 02/27/2020. A repeat thyroid ultrasound is recommended to further characterize this nodule. 4.  Aortic Atherosclerosis (ICD10-I70.0). CTA head: 1. No intracranial large vessel occlusion is identified. 2. Intracranial atherosclerotic disease with multifocal stenoses, most notably as follows. 3. Moderate/severe stenosis within a superior division mid M2 right MCA vessel. 4. Moderate stenosis within the A4 left anterior cerebral artery. 5. Moderate stenosis of the paraclinoid right ICA. Electronically Signed   By: Kellie Simmering D.O.   On: 03/05/2021 15:10   MR BRAIN WO CONTRAST  Result Date: 03/05/2021 CLINICAL DATA:  Left-sided weakness EXAM: MRI HEAD WITHOUT CONTRAST TECHNIQUE: Multiplanar, multiecho pulse sequences of the brain and surrounding structures were obtained without intravenous contrast. COMPARISON:  None. FINDINGS: Brain: Multifocal acute cortical ischemia in the anterior right MCA territory. Small focus of acute/subacute ischemia at the left caudate head. No acute or chronic hemorrhage. There is multifocal hyperintense T2-weighted signal within the white matter. Parenchymal volume and CSF spaces are normal. The midline structures are normal. Vascular: Major flow voids are preserved. Skull and upper cervical spine: Normal calvarium and skull base. Visualized upper cervical spine and soft tissues are normal. Sinuses/Orbits:No paranasal sinus fluid levels or advanced mucosal thickening. No mastoid or middle ear effusion. Normal orbits. IMPRESSION: 1. Multifocal acute/early subacute cortical ischemia in the anterior right MCA territory. No hemorrhage or mass effect. 2. Small focus of acute/early subacute ischemia at the left caudate head. Electronically Signed   By: Ulyses Jarred M.D.   On: 03/05/2021 21:47   DG CHEST PORT 1 VIEW  Result Date: 03/07/2021 CLINICAL DATA:  Respiratory  abnormalities. EXAM: PORTABLE CHEST 1 VIEW COMPARISON:  Radiograph 02/26/2021 FINDINGS: Lung volumes are low. Stable heart size and mediastinal contours. Patchy opacity in both lower lung zones. Mild additional patchy opacity in the right suprahilar lung. Vascular congestion. No pleural effusion. Anti lordotic positioning. IMPRESSION: 1. Low lung volumes with patchy bibasilar and right suprahilar opacities, may represent multifocal pneumonia, particularly at the right lung base. 2. Vascular congestion. Electronically Signed   By: Keith Rake M.D.   On: 03/07/2021 17:35   ECHOCARDIOGRAM COMPLETE  Result Date: 03/06/2021    ECHOCARDIOGRAM REPORT   Patient Name:   David [name] Date of Exam: 03/06/2021 Medical Rec #:  [id number]         Height:       72.0 in Accession #:    [id number]        Weight:       210.1 lb Date of Birth:  1940-05-15          BSA:          2.176 m Patient Age:    57 years          BP:           143/66 mmHg Patient Gender: M                 HR:           93 bpm. Exam Location:  Inpatient Procedure: 2D Echo, Cardiac Doppler, Color Doppler and Intracardiac            Opacification Agent Indications:    Stroke  History:  Patient has prior history of Echocardiogram examinations, most                 recent 06/13/2019.  Sonographer:    Clayton Lefort RDCS (AE) Referring Phys: [id number] Derek Jack  Sonographer Comments: Technically difficult study due to poor echo windows and no subcostal window. Image acquisition challenging due to respiratory motion. Patient coughing throughout test. IMPRESSIONS  1. Left ventricular ejection fraction, by estimation, is 65 to 70%. The left ventricle has normal function. The left ventricle has no regional wall motion abnormalities. There is moderate left ventricular hypertrophy. Left ventricular diastolic parameters are indeterminate.  2. Right ventricular systolic function is normal. The right ventricular size is normal.  3. The mitral valve is normal in  structure. No evidence of mitral valve regurgitation. No evidence of mitral stenosis.  4. The aortic valve is normal in structure. Aortic valve regurgitation is not visualized. No aortic stenosis is present.  5. The inferior vena cava is normal in size with greater than 50% respiratory variability, suggesting right atrial pressure of 3 mmHg. Comparison(s): Prior ascending aorta 42-43 mm. Unable to visual in current study. Conclusion(s)/Recommendation(s): No intracardiac source of embolism detected on this transthoracic study. Consider a transesophageal echocardiogram to exclude cardiac source of embolism if clinically indicated. FINDINGS  Left Ventricle: Left ventricular ejection fraction, by estimation, is 65 to 70%. The left ventricle has normal function. The left ventricle has no regional wall motion abnormalities. The left ventricular internal cavity size was normal in size. There is  moderate left ventricular hypertrophy. Left ventricular diastolic parameters are indeterminate. Right Ventricle: The right ventricular size is normal. No increase in right ventricular wall thickness. Right ventricular systolic function is normal. Left Atrium: Left atrial size was normal in size. Right Atrium: Right atrial size was normal in size. Pericardium: There is no evidence of pericardial effusion. Mitral Valve: The mitral valve is normal in structure. No evidence of mitral valve regurgitation. No evidence of mitral valve stenosis. Tricuspid Valve: The tricuspid valve is normal in structure. Tricuspid valve regurgitation is not demonstrated. No evidence of tricuspid stenosis. Aortic Valve: The aortic valve is normal in structure. Aortic valve regurgitation is not visualized. No aortic stenosis is present. Aortic valve mean gradient measures 7.0 mmHg. Aortic valve peak gradient measures 14.1 mmHg. Aortic valve area, by VTI measures 3.49 cm. Pulmonic Valve: The pulmonic valve was normal in structure. Pulmonic valve  regurgitation is not visualized. No evidence of pulmonic stenosis. Aorta: The aortic root is normal in size and structure. Venous: The inferior vena cava is normal in size with greater than 50% respiratory variability, suggesting right atrial pressure of 3 mmHg. IAS/Shunts: No atrial level shunt detected by color flow Doppler.  LEFT VENTRICLE PLAX 2D LVIDd:         4.70 cm LVIDs:         2.90 cm LV PW:         1.60 cm LV IVS:        1.80 cm LVOT diam:     2.30 cm LV SV:         108 LV SV Index:   49 LVOT Area:     4.15 cm  RIGHT VENTRICLE RV S prime:     17.40 cm/s TAPSE (M-mode): 2.4 cm LEFT ATRIUM           Index LA diam:      1.50 cm 0.69 cm/m LA Vol (A2C): 36.2 ml 16.64 ml/m LA Vol (A4C): 35.1  ml 16.13 ml/m  AORTIC VALVE AV Area (Vmax):    3.14 cm AV Area (Vmean):   3.59 cm AV Area (VTI):     3.49 cm AV Vmax:           188.00 cm/s AV Vmean:          126.000 cm/s AV VTI:            0.308 m AV Peak Grad:      14.1 mmHg AV Mean Grad:      7.0 mmHg LVOT Vmax:         142.00 cm/s LVOT Vmean:        109.000 cm/s LVOT VTI:          0.259 m LVOT/AV VTI ratio: 0.84  AORTA Ao Asc diam: 3.70 cm MITRAL VALVE MV Area (PHT): 2.87 cm     SHUNTS MV Decel Time: 264 msec     Systemic VTI:  0.26 m MV E velocity: 92.40 cm/s   Systemic Diam: 2.30 cm MV A velocity: 111.00 cm/s MV E/A ratio:  0.83 Candee Furbish MD Electronically signed by Candee Furbish MD Signature Date/Time: 03/06/2021/2:18:39 PM    Final    US THYROID  Result Date: 03/08/2021 CLINICAL DATA:  Incidental on CT. EXAM: THYROID ULTRASOUND TECHNIQUE: Ultrasound examination of the thyroid gland and adjacent soft tissues was performed. COMPARISON:  03/05/2021, 02/27/2020 FINDINGS: Parenchymal Echotexture: Mildly heterogenous Isthmus: 0.6 cm, previously 1.3 cm Right lobe: 4.2 x 1.4 x 1.7 cm, previously 4.5 x 2.3 x 1.8 cm Left lobe: 3.9 x 1.8 x 1.6 cm, previously 4.8 x 1.8 x 2.1 cm _________________________________________________________ Estimated total number of  nodules >/= 1 cm: 1 Number of spongiform nodules >/=  2 cm not described below (TR1): 0 Number of mixed cystic and solid nodules >/= 1.5 cm not described below (Early): 0 _________________________________________________________ Nodule # 1: Prior biopsy: No Location: Right; Superior Maximum size: 0.7 cm; Other 2 dimensions: 0.6 x 0.5 cm, previously, 0.6 x 0.6 x 0.5 cm Composition: cannot determine (2) Echogenicity: hypoechoic (2) Shape: not taller-than-wide (0) Margins: ill-defined (0) Echogenic foci: peripheral calcifications (2) ACR TI-RADS total points: 6. ACR TI-RADS risk category:  TR4 (4-6 points). Significant change in size (>/= 20% in two dimensions and minimal increase of 2 mm): No Change in features: No Change in ACR TI-RADS risk category: No ACR TI-RADS recommendations: Given size (<0.9 cm) and appearance, this nodule does NOT meet TI-RADS criteria for biopsy or dedicated follow-up. _________________________________________________________ Nodule # 2: Prior biopsy: No Location: Right; Inferior Maximum size: 1.1 cm; Other 2 dimensions: 1.0 x 0.9 cm, previously, 1.1 x 1.0 x 0.9 cm Composition: solid/almost completely solid (2) Echogenicity: hypoechoic (2) Shape: not taller-than-wide (0) Margins: smooth (0) Echogenic foci: none (0) ACR TI-RADS total points: 4. ACR TI-RADS risk category:  TR4 (4-6 points). Significant change in size (>/= 20% in two dimensions and minimal increase of 2 mm): No Change in features: No Change in ACR TI-RADS risk category: No ACR TI-RADS recommendations: *Given size (>/= 1 - 1.4 cm) and appearance, a follow-up ultrasound in 1 year should be considered based on TI-RADS criteria. _________________________________________________________ No cervical lymphadenopathy identified. IMPRESSION: 1. The right paratracheal nodule described on recent CT head neck is not visualized sonographically. In retrospect, this CT finding is similar to 06/13/2013 comparison chest CT and appears  extrathyroidal. 2. Unchanged appearance of right inferior solid thyroid nodule (labeled 2, 1.1 cm) which again meets criteria (TI-RADS category 4) for 1 year ultrasound surveillance.  The above is in keeping with the ACR TI-RADS recommendations - J Am Coll Radiol 2017;14:587-595. Ruthann Cancer, MD [facility] Electronically Signed   By: Ruthann Cancer M.D.   On: 03/08/2021 08:19   CT HEAD CODE STROKE WO CONTRAST  Result Date: 03/05/2021 CLINICAL DATA:  Code stroke.  Left-sided weakness EXAM: CT HEAD WITHOUT CONTRAST TECHNIQUE: Contiguous axial images were obtained from the base of the skull through the vertex without intravenous contrast. COMPARISON:  None. FINDINGS: Brain: There is no evidence of acute intracranial hemorrhage, extra-axial fluid collection, or acute infarct. Parenchymal volume is normal. The ventricles are normal in size. There is no mass lesion. There is no midline shift. Vascular: No dense vessel is seen. Skull: Normal. Negative for fracture or focal lesion. Sinuses/Orbits: There is layering fluid in the maxillary and sphenoid sinuses. Bilateral lens implants are in place. The globes and orbits are otherwise unremarkable. Other: None. ASPECTS [facility] Stroke Program Early CT Score) - Ganglionic level infarction (caudate, lentiform nuclei, internal capsule, insula, M1-M3 cortex): Seven - Supraganglionic infarction (M4-M6 cortex): 3 Total score (0-10 with 10 being normal): 10 IMPRESSION: 1. No acute intracranial pathology. 2. ASPECTS is 10 3. Layering fluid in the maxillary and sphenoid sinuses which can be seen with acute sinusitis in the correct clinical setting. These results were paged via AMION at the time of interpretation on 03/05/2021 at 2:29 pm to provider Dr Livia Snellen. Electronically Signed   By: Valetta Mole M.D.   On: 03/05/2021 14:32    Labs:  CBC: Recent Labs    03/05/21 1420 03/06/21 0515 03/07/21 0137 03/08/21 0350  WBC  12.5* 12.9* 19.2* 18.3*  HGB 11.8* 10.7* 10.8* 11.0*  HCT 35.7* 32.2* 32.4* 33.2*  PLT 208 191 226 260    COAGS: Recent Labs    05/19/20 0942 02/26/21 1308 03/05/21 1420  INR 1.1* 1.1 1.2  APTT  --   --  30    BMP: Recent Labs    03/05/21 1420 03/06/21 0515 03/07/21 0137 03/08/21 0350  NA 136 136 139 139  K 3.5 3.7 3.6 3.3*  CL 110 112* 113* 108  CO2 18* 18* 18* 22  GLUCOSE 86 98 125* 115*  BUN 28* 21 22 21   CALCIUM 10.1 9.8 9.5 9.4  CREATININE 1.26* 0.99 1.00 1.08  GFRNONAA 58* >60 >60 >60    LIVER FUNCTION TESTS: Recent Labs    11/18/20 0843 02/26/21 1308 03/05/21 1420 03/06/21 0515  BILITOT 0.5 0.7 1.1 1.1  AST 23 42* 52* 44*  ALT 17 38 52* 44  ALKPHOS 135* 102 111 98  PROT 6.3 7.3 6.5 5.9*  ALBUMIN 4.2 4.0 2.7* 2.4*    Assessment and Plan: Right carotid artery stenosis Patient sitting up in chair.  Alert and conversant.  Appears improved today- more alert and interactive.  No complaints related to groin.   US Carotid in 3 months with follow-up same day if possible.  Patient to remain on Brilinta 90mg  BID, aspirin 81mg  daily.  Electronically Signed: Docia Barrier, PA 03/08/2021, 9:07 AM   I spent a total of 15 Minutes at the the patient's bedside AND on the patient's hospital floor or unit, greater than 50% of which was counseling/coordinating care for right carotid artery stenosis.

## 2021-03-08 NOTE — PMR Pre-admission (Signed)
PMR Admission Coordinator Pre-Admission Assessment  Patient: David [name] is an 81 y.o., male MRN: [id number] DOB: 24-Jun-1940 Height: 6' (182.9 cm) Weight: 95 kg Insurance Information HMO:     PPO:      PCP:      IPA:      80/20:      OTHER:  PRIMARY: [employer] Medicare      Policy#: [id number]      Subscriber: pt CM Name: Moishe Spice      Phone#: [phone] option #7     Fax#: [phone] Pre-Cert#: [id number]     approved for 7 days Employer:  Benefits:  Phone #: [phone]     Name: 1/9 Eff. Date: 02/28/2021     Deduct: none      Out of Pocket Max: $3900      Life Max: none CIR: $295 co pay per day days 1 until 5      SNF: no copay days 1 until 20; $196 co pay per day days 21 until 40 Outpatient: $20 per visit     Co-Pay: visits per medical neccesity Home Health: 100%      Co-Pay: visits per medical neccesity DME: 80%     Co-Pay: 20% Providers: in network  SECONDARY: none  Financial Counselor:       Phone#:   The [employer] for patients in Inpatient Rehabilitation Facilities with attached Privacy Act [place] Records was provided and verbally reviewed with: Patient and Family  Emergency Contact Information Contact Information     Name Relation Home Work Mobile   Mar[place] Spouse [phone]  [place] Daughter   [phone]   Spencer,Cheryl Daughter   [phone]      Current Medical History  Patient Admitting Diagnosis: CVA  History of Present Illness:  81 year old male with a history of coronary artery disease, hyperlipidemia, hypertension, remote history of prostate cancer, renal oncocytoma of the right kidney who presented with left-sided weakness, dysarthria and visual neglect.  Presented on 03/05/2021.  He was on the toilet when he developed left arm and left leg weakness and his wife observed that he was unable to stand.  She called emergency medical services and he was transferred ported to the  [facility] emergency department.  Code stroke was initiated he was outside the window for thrombolytics and his CT/CTA of the head and neck revealed right ICA occlusion.  Code IR activated.  He underwent diagnostic cerebral angiogram with right carotid stenting with cerebral protective device by Dr. Karenann Cai.  Findings revealed atherosclerotic changes of the right carotid bifurcation with associated thrombus at approximately 80 to 90% stenosis in the right carotid bulb.  A 6 to 8 x 40 mm XACT carotid stent was utilized.  Ticagrelor 90 mg twice daily and aspirin 81 mg daily initiated.  MRI of the brain showed right frontal cortical small infarcts and left CR small infarct.  Telemetry showed frequent PVCs but no atrial fibrillation.Place on [place]. He has developed persistent cough with worsening leukocytosis.  Family states he has had persistent cough for approximately 2 to 3 weeks prior to admission and he completed an outpatient course of Augmentin and steroids.  He also tested positive for influenza A on 02/26/2021 and completed a course of Tamiflu.  COVID and flu swabs were performed on 03/06/2021 and were negative.  Chest x-ray on 03/07/2021 shows bilateral infiltrates concerning for multifocal pneumonia.  He has been afebrile with normal SaO2 on 2 L  of oxygen.  Unasyn was started.  SLP reevaluation obtained and modified barium swallow performed on 03/10/2021.  Findings were moderate to severe pharyngeal dysphagia likely acute on chronic in nature.  Recommended dysphagia 3 diet with nectar thick liquids.  In addition, IV fluids were discontinued and he was given Lasix x1 dose.   2D echocardiogram ejection fraction 65 to 70%.LDL equal 106.Hemoglobin A1c equals 6.3, VTE prophylaxis Lovenox 40 mg daily.   Complete NIHSS TOTAL: 5  Patient's medical record from [facility] has been reviewed by the rehabilitation admission coordinator and physician.  Past Medical History  Past Medical  History:  Diagnosis Date   Allergy    Arthritis    gen.  and left hip   BPH (benign prostatic hypertrophy)    Coronary artery disease    GERD (gastroesophageal reflux disease)    occasional   History of kidney stones    2007   Hyperlipidemia    Hypertension    Kidney tumor    LBP (low back pain) 06/2005   L5 radicular symptoms; MRI of LS spine severe spondylosis at L4-L5 with central cancal stenosis    Nocturia    Organic impotence    Prostate cancer ([place]) 10/2011   (Low grade) [facility] - Dr. Junious Silk   Pyelonephritis 03/05/2013   Renal oncocytoma of right kidney 01/23/2013   As 3.2 x 3.5 cm enhancing exophytic mass projecting off the lower pole. This is consistent with a renal cell carcinoma. No retroperitoneal lymphadenopathy.    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 yes  Family History   family history includes Cancer in his mother; Colon cancer (age of onset: 52) in his mother; Coronary artery disease in an other family member; Hyperlipidemia in an other family member; Pulmonary fibrosis in his brother and father.  Current Medications  Current Facility-Administered Medications:    acetaminophen (TYLENOL) tablet 650 mg, 650 mg, Oral, Q6H PRN, Donnetta Simpers, MD, 650 mg at 03/08/21 0341   albuterol (PROVENTIL) (2.5 MG/3ML) 0.083% nebulizer solution 3 mL, 3 mL, Inhalation, Q6H PRN, Patrecia Pour, MD   Ampicillin-Sulbactam (UNASYN) 3 g in sodium chloride 0.9 % 100 mL IVPB, 3 g, Intravenous, Q6H, Girguis, David, MD, Last Rate: 200 mL/hr at 03/06/2021 0626, 3 g at 03/02/2021 2355   aspirin EC tablet 81 mg, 81 mg, Oral, Daily, de Sindy Messing, [place], MD, 81 mg at 03/23/2021 1049   atorvastatin (LIPITOR) tablet 80 mg, 80 mg, Oral, Daily, Derek Jack, MD, 80 mg at 03/03/2021 1049   benzonatate (TESSALON) capsule 100 mg, 100 mg, Oral, TID, Vance Gather B, MD, 100 mg at 03/21/2021 1048    Chlorhexidine Gluconate Cloth 2 % PADS 6 each, 6 each, Topical, Q0600, Derek Jack, MD, 6 each at 03/08/21 0646   dextromethorphan (DELSYM) 30 MG/5ML liquid 30 mg, 30 mg, Oral, TID, Dwyane Dee, MD, 30 mg at 03/24/2021 1048   DULoxetine (CYMBALTA) DR capsule 30 mg, 30 mg, Oral, Daily, Su Monks M, MD, 30 mg at 03/02/2021 1048   enoxaparin (LOVENOX) injection 40 mg, 40 mg, Subcutaneous, Q24H, Derek Jack, MD, 40 mg at 03/08/21 1741   ezetimibe (ZETIA) tablet 10 mg, 10 mg, Oral, Daily, Derek Jack, MD, 10 mg at 03/05/2021 1049   guaiFENesin (MUCINEX) 12 hr tablet 600 mg, 600 mg, Oral, BID, Dwyane Dee, MD, 600 mg at 03/24/2021 1049  iohexol (OMNIPAQUE) 300 MG/ML solution 100 mL, 100 mL, Intra-arterial, Once PRN, de Sindy Messing, Erven Colla, MD   labetalol (NORMODYNE) injection 5-20 mg, 5-20 mg, Intravenous, Q2H PRN, Rosalin Hawking, MD   magic mouthwash, 5 mL, Oral, TID PRN, Rosalin Hawking, MD, 5 mL at 03/06/21 2012   MEDLINE mouth rinse, 15 mL, Mouth Rinse, BID, Rosalin Hawking, MD, 15 mL at 03/15/2021 0940   pantoprazole (PROTONIX) EC tablet 40 mg, 40 mg, Oral, BID, Derek Jack, MD, 40 mg at 03/17/2021 1048   potassium chloride (KLOR-CON) packet 40 mEq, 40 mEq, Oral, Once, Patrecia Pour, MD   senna-docusate (Senokot-S) tablet 1 tablet, 1 tablet, Oral, QHS PRN, Derek Jack, MD   sodium chloride flush (NS) 0.9 % injection 3 mL, 3 mL, Intravenous, Once, Gareth Morgan, MD   ticagrelor (BRILINTA) tablet 90 mg, 90 mg, Oral, BID, Derek Jack, MD, 90 mg at 03/18/2021 1049  Patients Current Diet:  Diet Order             Diet Heart Room service appropriate? Yes; Fluid consistency: Thin  Diet effective now                 1/10 Dysphagia 3 diet with nectar thick liquids and meds crushed in puree  Precautions / Restrictions Precautions Precautions: Fall Precaution Comments: L hemiparesis Restrictions Weight Bearing Restrictions: No   Has the patient had 2 or more falls or  a fall with injury in the past year? No  Prior Activity Level Community (5-7x/wk): independent, driving, active  Prior Functional Level Self Care: Did the patient need help bathing, dressing, using the toilet or eating? Independent  Indoor Mobility: Did the patient need assistance with walking from room to room (with or without device)? Independent  Stairs: Did the patient need assistance with internal or external stairs (with or without device)? Independent  Functional Cognition: Did the patient need help planning regular tasks such as shopping or remembering to take medications? Independent  Patient Information Are you of Hispanic, Latino/a,or Spanish origin?: A. No, not of Hispanic, Latino/a, or Spanish origin What is your race?: A. White Do you need or want an interpreter to communicate with a doctor or health care staff?: 0. No  Patient's Response To:  Health Literacy and Transportation Is the patient able to respond to health literacy and transportation needs?: Yes Health Literacy - How often do you need to have someone help you when you read instructions, pamphlets, or other written material from your doctor or pharmacy?: Never In the past 12 months, has lack of transportation kept you from medical appointments or from getting medications?: No In the past 12 months, has lack of transportation kept you from meetings, work, or from getting things needed for daily living?: No  Development worker, international aid / [place] Devices/Equipment: None Home Equipment: Conservation officer, nature (2 wheels), Grab bars - toilet, Grab bars - tub/shower  Prior Device Use: Indicate devices/aids used by the patient prior to current illness, exacerbation or injury? None of the above  Current Functional Level Cognition  Arousal/Alertness: Awake/alert Overall Cognitive Status: Impaired/Different from baseline Current Attention Level: Selective Orientation Level: Oriented to person, Oriented to place,  Oriented to situation Following Commands: Follows one step commands consistently, Follows one step commands with increased time Safety/Judgement: Decreased awareness of safety General Comments: Will further assess Attention: Focused, Sustained Focused Attention: Appears intact Focused Attention Impairment: Verbal complex Sustained Attention: Impaired Sustained Attention Impairment: Verbal complex Memory: Impaired Memory Impairment:  (Immediate:  5/5; delayed:1/5; with cues: 4/4) Awareness: Impaired Awareness Impairment: Emergent impairment Executive Function: Sequencing, Technical brewer:  (clock drawing: 4/4) Organizing: Impaired Organizing Impairment: Verbal complex (backward digit span: 0/2)    Extremity Assessment (includes Sensation/Coordination)  Upper Extremity Assessment: Defer to OT evaluation LUE Deficits / Details: Greater  proximal strength although weak shoulder; able to complete hand to move and fle3x shoulder @ 90; isolated movements; gross grasp/release; unable to make full fist; unable to oppose thumb to digits with any strength; absent in-hand manipulation skills; attempts to use functionally, repetitively, even when he is not being successful with use of L hand LUE Coordination: decreased fine motor, decreased gross motor  Lower Extremity Assessment: LLE deficits/detail LLE Deficits / Details: grossly 4-/5 LLE Coordination: decreased fine motor, decreased gross motor    ADLs  Overall ADL's : Needs assistance/impaired Eating/Feeding: Supervision/ safety, Set up Grooming: Minimal assistance Upper Body Bathing: Minimal assistance Lower Body Bathing: Moderate assistance, Sit to/from stand Upper Body Dressing : Moderate assistance Lower Body Dressing: Maximal assistance Toilet Transfer: Minimal assistance, +2 for physical assistance Toileting- Clothing Manipulation and Hygiene: Total assistance Toileting - Clothing Manipulation Details (indicate cue type and  reason): incontinenet of urine; staes he had prostrate surgery adn hasn't had a "wet night since surgery"; unsure if urinary incontinence is new Functional mobility during ADLs: Minimal assistance, +2 for physical assistance General ADL Comments: Very slow movement patterns    Mobility  Overal bed mobility: Needs Assistance Bed Mobility: Supine to Sit Supine to sit: Min assist General bed mobility comments: using R rail and able to get legs off bed, but difficulty lifting trunk due to proximity to rail, assist to lift trunk and scoot L hip with cues    Transfers  Overall transfer level: Needs assistance Equipment used: Rolling walker (2 wheels) Transfers: Sit to/from Stand Sit to Stand: Min assist, From elevated surface Bed to/from chair/wheelchair/BSC transfer type:: Step pivot Step pivot transfers: Min assist, +2 physical assistance    Ambulation / Gait / Stairs / Wheelchair Mobility  Ambulation/Gait Ambulation/Gait assistance: Herbalist (Feet): 30 Feet Assistive device: Rolling walker (2 wheels) Gait Pattern/deviations: Step-to pattern, Step-through pattern, Decreased stride length, Trunk flexed, Decreased dorsiflexion - left, Decreased step length - left General Gait Details: assist for L hand on RW and for walker management in small space in room, assist for balance, cues for posture and walker safety    Posture / Balance Balance Overall balance assessment: Needs assistance Sitting-balance support: Feet supported Sitting balance-Leahy Scale: Fair Standing balance support: Bilateral upper extremity supported Standing balance-Leahy Scale: Poor    Special needs/care consideration Flu positive 02/26/2021 Hgb A1c 6.3 LOOP to be placed prior to discharge to [place]: Spouse/significant other  Lives With: Spouse Available Help at Discharge:  (1 daughter local , other daughter out of town) Type of Home: [place]: One level Home Access: Stairs to enter Entrance Stairs-Rails: Can reach both, Left, Right Entrance Stairs-Number of Steps: 2 Bathroom Shower/Tub: Gaffer, Chiropodist: Handicapped height Bathroom Accessibility: Yes How Accessible: Accessible via walker, Accessible via wheelchair [place]: No  Discharge [place] for Discharge Living Setting: Patient's home, Lives with (comment) (wife) Type of Home at Discharge: House Discharge Home Layout: One level Discharge Home Access: Stairs to enter Entrance Stairs-Rails: Right, Left, Can reach both Entrance Stairs-Number of Steps: 2 Discharge Bathroom Shower/Tub: Walk-in shower, Tub/shower unit Discharge Bathroom Toilet: Handicapped height Discharge  Bathroom Accessibility: Yes How Accessible: Accessible via walker Does the patient have any problems obtaining your medications?: No  Social/Family/Support Systems Patient Roles: Spouse, Caregiver (caregiver for wife since she had knee surgery Oct /2022) Contact Information: wife, Vickii Chafe Anticipated Caregiver: daughters and family members Anticipated Caregiver's Contact Information: see contacts Ability/Limitations of Caregiver: wife knee issues since 11/2020 Caregiver Availability: 24/7 Discharge Plan Discussed with Primary Caregiver: Yes Is Caregiver In Agreement with Plan?: Yes Does Caregiver/Family have Issues with Lodging/Transportation while Pt is in Rehab?: No  Goals Patient/Family Goal for Rehab: supervision to min assist with PT and OT, supervision with SLP Expected length of stay: ELOS 10 to 14 days Pt/Family Agrees to Admission and willing to participate: Yes Program Orientation Provided & Reviewed with Pt/Caregiver Including Roles  & Responsibilities: Yes  Decrease burden of Care through IP rehab admission: n/a  Possible need for SNF placement upon discharge: not anticipated  Patient Condition: I have reviewed medical  records from [facility], spoken with CM, and patient, spouse, and daughter. I met with patient at the bedside for inpatient rehabilitation assessment.  Patient will benefit from ongoing PT, OT, and SLP, can actively participate in 3 hours of therapy a day 5 days of the week, and can make measurable gains during the admission.  Patient will also benefit from the coordinated team approach during an Inpatient Acute Rehabilitation admission.  The patient will receive intensive therapy as well as Rehabilitation physician, nursing, social worker, and care management interventions.  Due to bladder management, bowel management, safety, skin/wound care, disease management, medication administration, pain management, and patient education the patient requires 24 hour a day rehabilitation nursing.  The patient is currently min to mod assist overall with mobility and basic ADLs.  Discharge setting and therapy post discharge at home with home health is anticipated.  Patient has agreed to participate in the Acute Inpatient Rehabilitation Program and will admit today.  Preadmission Screen Completed By:  Cleatrice Burke, 03/27/2021 11:37 AM ______________________________________________________________________   Discussed status with Dr. Naaman Plummer on  03/21/2021 at 1136 and received approval for admission today.  Admission Coordinator:  Cleatrice Burke, RN, time  1136 Date  03/27/2021   Assessment/Plan: Diagnosis: right MCA infarct  Does the need for close, 24 hr/day Medical supervision in concert with the patient's rehab needs make it unreasonable for this patient to be served in a less intensive setting? Yes Co-Morbidities requiring supervision/potential complications: cad, htn, prostate cancer Due to bladder management, bowel management, safety, skin/wound care, disease management, medication administration, pain management, and patient education, does the patient require 24 hr/day rehab nursing?  Yes Does the patient require coordinated care of a physician, rehab nurse, PT, OT, and SLP to address physical and functional deficits in the context of the above medical diagnosis(es)? Yes Addressing deficits in the following areas: balance, endurance, locomotion, strength, transferring, bowel/bladder control, bathing, dressing, feeding, grooming, toileting, cognition, speech, language, and psychosocial support Can the patient actively participate in an intensive therapy program of at least 3 hrs of therapy 5 days a week? Yes The potential for patient to make measurable gains while on inpatient rehab is excellent Anticipated functional outcomes upon discharge from inpatient rehab: supervision and min assist PT, supervision and min assist OT, supervision and min assist SLP Estimated rehab length of stay to reach the above functional goals is: 10-14 days Anticipated discharge destination: Home 10. Overall Rehab/Functional Prognosis: excellent   MD Signature: [name], MD, [place]  Medical Director Rehabilitation Services 03/14/2021

## 2021-03-09 ENCOUNTER — Ambulatory Visit: Payer: Medicare Other | Admitting: Gastroenterology

## 2021-03-09 ENCOUNTER — Encounter (HOSPITAL_COMMUNITY)
Admission: EM | Disposition: A | Discharge status: Rehabilitation Facility | Payer: Self-pay | Source: Home / Self Care | Attending: Neurology

## 2021-03-09 ENCOUNTER — Inpatient Hospital Stay (HOSPITAL_COMMUNITY): Payer: Medicare Other

## 2021-03-09 ENCOUNTER — Other Ambulatory Visit: Payer: Self-pay | Admitting: Radiology

## 2021-03-09 ENCOUNTER — Other Ambulatory Visit: Payer: Self-pay

## 2021-03-09 ENCOUNTER — Other Ambulatory Visit (HOSPITAL_COMMUNITY): Payer: Self-pay

## 2021-03-09 ENCOUNTER — Inpatient Hospital Stay (HOSPITAL_COMMUNITY)
Admission: RE | Admit: 2021-03-09 | Discharge: 2021-03-31 | DRG: 056 | Disposition: E | Payer: Medicare Other | Source: Intra-hospital | Attending: Physical Medicine & Rehabilitation | Admitting: Physical Medicine & Rehabilitation

## 2021-03-09 ENCOUNTER — Encounter (HOSPITAL_COMMUNITY): Payer: Self-pay | Admitting: Neurology

## 2021-03-09 DIAGNOSIS — E785 Hyperlipidemia, unspecified: Secondary | ICD-10-CM | POA: Diagnosis not present

## 2021-03-09 DIAGNOSIS — Z515 Encounter for palliative care: Secondary | ICD-10-CM

## 2021-03-09 DIAGNOSIS — D649 Anemia, unspecified: Secondary | ICD-10-CM | POA: Diagnosis present

## 2021-03-09 DIAGNOSIS — K449 Diaphragmatic hernia without obstruction or gangrene: Secondary | ICD-10-CM | POA: Diagnosis present

## 2021-03-09 DIAGNOSIS — I700 Atherosclerosis of aorta: Secondary | ICD-10-CM | POA: Diagnosis not present

## 2021-03-09 DIAGNOSIS — R300 Dysuria: Secondary | ICD-10-CM | POA: Diagnosis present

## 2021-03-09 DIAGNOSIS — I63511 Cerebral infarction due to unspecified occlusion or stenosis of right middle cerebral artery: Secondary | ICD-10-CM | POA: Diagnosis present

## 2021-03-09 DIAGNOSIS — Z8249 Family history of ischemic heart disease and other diseases of the circulatory system: Secondary | ICD-10-CM

## 2021-03-09 DIAGNOSIS — N200 Calculus of kidney: Secondary | ICD-10-CM | POA: Diagnosis not present

## 2021-03-09 DIAGNOSIS — K219 Gastro-esophageal reflux disease without esophagitis: Secondary | ICD-10-CM | POA: Diagnosis present

## 2021-03-09 DIAGNOSIS — R000 Tachycardia, unspecified: Secondary | ICD-10-CM | POA: Diagnosis not present

## 2021-03-09 DIAGNOSIS — Z96652 Presence of left artificial knee joint: Secondary | ICD-10-CM | POA: Diagnosis present

## 2021-03-09 DIAGNOSIS — M7062 Trochanteric bursitis, left hip: Secondary | ICD-10-CM | POA: Diagnosis present

## 2021-03-09 DIAGNOSIS — J189 Pneumonia, unspecified organism: Secondary | ICD-10-CM | POA: Diagnosis present

## 2021-03-09 DIAGNOSIS — Z8546 Personal history of malignant neoplasm of prostate: Secondary | ICD-10-CM | POA: Diagnosis not present

## 2021-03-09 DIAGNOSIS — Z0189 Encounter for other specified special examinations: Secondary | ICD-10-CM

## 2021-03-09 DIAGNOSIS — R052 Subacute cough: Secondary | ICD-10-CM

## 2021-03-09 DIAGNOSIS — I69391 Dysphagia following cerebral infarction: Secondary | ICD-10-CM | POA: Diagnosis not present

## 2021-03-09 DIAGNOSIS — Z85528 Personal history of other malignant neoplasm of kidney: Secondary | ICD-10-CM

## 2021-03-09 DIAGNOSIS — Z79899 Other long term (current) drug therapy: Secondary | ICD-10-CM

## 2021-03-09 DIAGNOSIS — E43 Unspecified severe protein-calorie malnutrition: Secondary | ICD-10-CM | POA: Insufficient documentation

## 2021-03-09 DIAGNOSIS — I6521 Occlusion and stenosis of right carotid artery: Secondary | ICD-10-CM | POA: Diagnosis not present

## 2021-03-09 DIAGNOSIS — Z87442 Personal history of urinary calculi: Secondary | ICD-10-CM | POA: Diagnosis not present

## 2021-03-09 DIAGNOSIS — E041 Nontoxic single thyroid nodule: Secondary | ICD-10-CM | POA: Diagnosis not present

## 2021-03-09 DIAGNOSIS — Z4682 Encounter for fitting and adjustment of non-vascular catheter: Secondary | ICD-10-CM | POA: Diagnosis not present

## 2021-03-09 DIAGNOSIS — K4020 Bilateral inguinal hernia, without obstruction or gangrene, not specified as recurrent: Secondary | ICD-10-CM | POA: Diagnosis not present

## 2021-03-09 DIAGNOSIS — J329 Chronic sinusitis, unspecified: Secondary | ICD-10-CM | POA: Diagnosis not present

## 2021-03-09 DIAGNOSIS — N281 Cyst of kidney, acquired: Secondary | ICD-10-CM | POA: Diagnosis not present

## 2021-03-09 DIAGNOSIS — I639 Cerebral infarction, unspecified: Secondary | ICD-10-CM | POA: Diagnosis not present

## 2021-03-09 DIAGNOSIS — J0110 Acute frontal sinusitis, unspecified: Secondary | ICD-10-CM | POA: Diagnosis not present

## 2021-03-09 DIAGNOSIS — R269 Unspecified abnormalities of gait and mobility: Secondary | ICD-10-CM | POA: Diagnosis present

## 2021-03-09 DIAGNOSIS — Z800 Family history of malignant neoplasm of digestive organs: Secondary | ICD-10-CM

## 2021-03-09 DIAGNOSIS — R059 Cough, unspecified: Secondary | ICD-10-CM | POA: Diagnosis not present

## 2021-03-09 DIAGNOSIS — F1090 Alcohol use, unspecified, uncomplicated: Secondary | ICD-10-CM | POA: Diagnosis present

## 2021-03-09 DIAGNOSIS — R053 Chronic cough: Secondary | ICD-10-CM | POA: Diagnosis present

## 2021-03-09 DIAGNOSIS — R1310 Dysphagia, unspecified: Secondary | ICD-10-CM | POA: Diagnosis not present

## 2021-03-09 DIAGNOSIS — I2510 Atherosclerotic heart disease of native coronary artery without angina pectoris: Secondary | ICD-10-CM | POA: Diagnosis present

## 2021-03-09 DIAGNOSIS — K6389 Other specified diseases of intestine: Secondary | ICD-10-CM | POA: Diagnosis not present

## 2021-03-09 DIAGNOSIS — R7401 Elevation of levels of liver transaminase levels: Secondary | ICD-10-CM | POA: Diagnosis not present

## 2021-03-09 DIAGNOSIS — B59 Pneumocystosis: Secondary | ICD-10-CM | POA: Diagnosis not present

## 2021-03-09 DIAGNOSIS — Z7982 Long term (current) use of aspirin: Secondary | ICD-10-CM

## 2021-03-09 DIAGNOSIS — R627 Adult failure to thrive: Secondary | ICD-10-CM | POA: Diagnosis not present

## 2021-03-09 DIAGNOSIS — I69322 Dysarthria following cerebral infarction: Secondary | ICD-10-CM | POA: Diagnosis not present

## 2021-03-09 DIAGNOSIS — I129 Hypertensive chronic kidney disease with stage 1 through stage 4 chronic kidney disease, or unspecified chronic kidney disease: Secondary | ICD-10-CM | POA: Diagnosis not present

## 2021-03-09 DIAGNOSIS — I69398 Other sequelae of cerebral infarction: Secondary | ICD-10-CM

## 2021-03-09 DIAGNOSIS — Z6828 Body mass index (BMI) 28.0-28.9, adult: Secondary | ICD-10-CM

## 2021-03-09 DIAGNOSIS — Z66 Do not resuscitate: Secondary | ICD-10-CM | POA: Diagnosis not present

## 2021-03-09 DIAGNOSIS — Z888 Allergy status to other drugs, medicaments and biological substances status: Secondary | ICD-10-CM

## 2021-03-09 DIAGNOSIS — B9689 Other specified bacterial agents as the cause of diseases classified elsewhere: Secondary | ICD-10-CM | POA: Diagnosis not present

## 2021-03-09 DIAGNOSIS — N390 Urinary tract infection, site not specified: Secondary | ICD-10-CM | POA: Diagnosis not present

## 2021-03-09 DIAGNOSIS — Z7189 Other specified counseling: Secondary | ICD-10-CM | POA: Diagnosis not present

## 2021-03-09 DIAGNOSIS — J159 Unspecified bacterial pneumonia: Secondary | ICD-10-CM

## 2021-03-09 DIAGNOSIS — Z87891 Personal history of nicotine dependence: Secondary | ICD-10-CM

## 2021-03-09 DIAGNOSIS — Z905 Acquired absence of kidney: Secondary | ICD-10-CM

## 2021-03-09 DIAGNOSIS — E663 Overweight: Secondary | ICD-10-CM | POA: Diagnosis present

## 2021-03-09 DIAGNOSIS — T17928A Food in respiratory tract, part unspecified causing other injury, initial encounter: Secondary | ICD-10-CM

## 2021-03-09 DIAGNOSIS — R1313 Dysphagia, pharyngeal phase: Secondary | ICD-10-CM | POA: Diagnosis present

## 2021-03-09 DIAGNOSIS — I69354 Hemiplegia and hemiparesis following cerebral infarction affecting left non-dominant side: Secondary | ICD-10-CM | POA: Diagnosis not present

## 2021-03-09 DIAGNOSIS — M25512 Pain in left shoulder: Secondary | ICD-10-CM | POA: Diagnosis not present

## 2021-03-09 DIAGNOSIS — E876 Hypokalemia: Secondary | ICD-10-CM | POA: Diagnosis present

## 2021-03-09 DIAGNOSIS — N1831 Chronic kidney disease, stage 3a: Secondary | ICD-10-CM | POA: Diagnosis not present

## 2021-03-09 DIAGNOSIS — J111 Influenza due to unidentified influenza virus with other respiratory manifestations: Secondary | ICD-10-CM

## 2021-03-09 DIAGNOSIS — R32 Unspecified urinary incontinence: Secondary | ICD-10-CM | POA: Diagnosis present

## 2021-03-09 DIAGNOSIS — I493 Ventricular premature depolarization: Secondary | ICD-10-CM | POA: Diagnosis present

## 2021-03-09 DIAGNOSIS — R5383 Other fatigue: Secondary | ICD-10-CM | POA: Diagnosis not present

## 2021-03-09 DIAGNOSIS — I69390 Apraxia following cerebral infarction: Secondary | ICD-10-CM

## 2021-03-09 DIAGNOSIS — R579 Shock, unspecified: Secondary | ICD-10-CM | POA: Diagnosis not present

## 2021-03-09 DIAGNOSIS — E7800 Pure hypercholesterolemia, unspecified: Secondary | ICD-10-CM

## 2021-03-09 DIAGNOSIS — I10 Essential (primary) hypertension: Secondary | ICD-10-CM

## 2021-03-09 DIAGNOSIS — I6523 Occlusion and stenosis of bilateral carotid arteries: Secondary | ICD-10-CM

## 2021-03-09 DIAGNOSIS — N400 Enlarged prostate without lower urinary tract symptoms: Secondary | ICD-10-CM | POA: Diagnosis present

## 2021-03-09 DIAGNOSIS — Z4659 Encounter for fitting and adjustment of other gastrointestinal appliance and device: Secondary | ICD-10-CM

## 2021-03-09 DIAGNOSIS — Z885 Allergy status to narcotic agent status: Secondary | ICD-10-CM

## 2021-03-09 DIAGNOSIS — F419 Anxiety disorder, unspecified: Secondary | ICD-10-CM | POA: Diagnosis present

## 2021-03-09 DIAGNOSIS — H5789 Other specified disorders of eye and adnexa: Secondary | ICD-10-CM | POA: Diagnosis not present

## 2021-03-09 HISTORY — PX: LOOP RECORDER INSERTION: EP1214

## 2021-03-09 LAB — CBC WITH DIFFERENTIAL/PLATELET
Abs Immature Granulocytes: 0.15 10*3/uL — ABNORMAL HIGH (ref 0.00–0.07)
Basophils Absolute: 0 10*3/uL (ref 0.0–0.1)
Basophils Relative: 0 %
Eosinophils Absolute: 0.1 10*3/uL (ref 0.0–0.5)
Eosinophils Relative: 1 %
HCT: 32.4 % — ABNORMAL LOW (ref 39.0–52.0)
Hemoglobin: 10.6 g/dL — ABNORMAL LOW (ref 13.0–17.0)
Immature Granulocytes: 1 %
Lymphocytes Relative: 8 %
Lymphs Abs: 1.3 10*3/uL (ref 0.7–4.0)
MCH: 29 pg (ref 26.0–34.0)
MCHC: 32.7 g/dL (ref 30.0–36.0)
MCV: 88.8 fL (ref 80.0–100.0)
Monocytes Absolute: 1 10*3/uL (ref 0.1–1.0)
Monocytes Relative: 6 %
Neutro Abs: 13.8 10*3/uL — ABNORMAL HIGH (ref 1.7–7.7)
Neutrophils Relative %: 84 %
Platelets: 264 10*3/uL (ref 150–400)
RBC: 3.65 MIL/uL — ABNORMAL LOW (ref 4.22–5.81)
RDW: 14.9 % (ref 11.5–15.5)
WBC: 16.3 10*3/uL — ABNORMAL HIGH (ref 4.0–10.5)
nRBC: 0 % (ref 0.0–0.2)

## 2021-03-09 LAB — BASIC METABOLIC PANEL
Anion gap: 8 (ref 5–15)
BUN: 23 mg/dL (ref 8–23)
CO2: 20 mmol/L — ABNORMAL LOW (ref 22–32)
Calcium: 9.1 mg/dL (ref 8.9–10.3)
Chloride: 111 mmol/L (ref 98–111)
Creatinine, Ser: 0.99 mg/dL (ref 0.61–1.24)
GFR, Estimated: >60 mL/min (ref >60)
Glucose, Bld: 119 mg/dL — ABNORMAL HIGH (ref 70–99)
Potassium: 3.2 mmol/L — ABNORMAL LOW (ref 3.5–5.1)
Sodium: 139 mmol/L (ref 135–145)

## 2021-03-09 LAB — LEGIONELLA PNEUMOPHILA SEROGP 1 UR AG: L. pneumophila Serogp 1 Ur Ag: NEGATIVE

## 2021-03-09 LAB — PROCALCITONIN: Procalcitonin: 0.74 ng/mL

## 2021-03-09 SURGERY — LOOP RECORDER INSERTION
Anesthesia: LOCAL

## 2021-03-09 MED ORDER — EZETIMIBE 10 MG PO TABS
10.0000 mg | ORAL_TABLET | Freq: Every day | ORAL | Status: DC
  Administered 2021-03-10 – 2021-03-27 (×17): 10 mg via ORAL
  Filled 2021-03-09 (×18): qty 1

## 2021-03-09 MED ORDER — LIDOCAINE-EPINEPHRINE 1 %-1:100000 IJ SOLN
INTRAMUSCULAR | Status: DC | PRN
  Administered 2021-03-09: 10 mL

## 2021-03-09 MED ORDER — BENZONATATE 100 MG PO CAPS
100.0000 mg | ORAL_CAPSULE | Freq: Three times a day (TID) | ORAL | 0 refills | Status: AC
  Filled 2021-03-09: qty 20, 7d supply, fill #0

## 2021-03-09 MED ORDER — PROCHLORPERAZINE EDISYLATE 10 MG/2ML IJ SOLN: 5.0000 mg | Freq: Four times a day (QID) | INTRAMUSCULAR | Status: DC | PRN

## 2021-03-09 MED ORDER — TRAZODONE HCL 50 MG PO TABS
25.0000 mg | ORAL_TABLET | Freq: Every evening | ORAL | Status: DC | PRN
  Administered 2021-03-11: 50 mg via ORAL
  Filled 2021-03-09: qty 1

## 2021-03-09 MED ORDER — SODIUM CHLORIDE 0.9 % IV SOLN
3.0000 g | Freq: Four times a day (QID) | INTRAVENOUS | Status: DC
  Administered 2021-03-09 – 2021-03-17 (×31): 3 g via INTRAVENOUS
  Filled 2021-03-09 (×34): qty 8

## 2021-03-09 MED ORDER — ALUM & MAG HYDROXIDE-SIMETH 200-200-20 MG/5ML PO SUSP: 30.0000 mL | ORAL | Status: DC | PRN

## 2021-03-09 MED ORDER — PROCHLORPERAZINE 25 MG RE SUPP: 12.5000 mg | Freq: Four times a day (QID) | RECTAL | Status: DC | PRN

## 2021-03-09 MED ORDER — ACETAMINOPHEN 325 MG PO TABS
325.0000 mg | ORAL_TABLET | ORAL | Status: DC | PRN
  Administered 2021-03-10 – 2021-03-20 (×6): 650 mg via ORAL
  Filled 2021-03-09 (×6): qty 2

## 2021-03-09 MED ORDER — BISACODYL 10 MG RE SUPP: 10.0000 mg | Freq: Every day | RECTAL | Status: DC | PRN

## 2021-03-09 MED ORDER — ENOXAPARIN SODIUM 40 MG/0.4ML IJ SOSY: 40.0000 mg | PREFILLED_SYRINGE | INTRAMUSCULAR | Status: DC

## 2021-03-09 MED ORDER — DEXTROMETHORPHAN POLISTIREX ER 30 MG/5ML PO SUER
30.0000 mg | Freq: Three times a day (TID) | ORAL | Status: DC
  Administered 2021-03-09 – 2021-03-27 (×52): 30 mg via ORAL
  Filled 2021-03-09 (×61): qty 5

## 2021-03-09 MED ORDER — BENZONATATE 100 MG PO CAPS
100.0000 mg | ORAL_CAPSULE | Freq: Three times a day (TID) | ORAL | Status: DC
  Administered 2021-03-09 – 2021-03-10 (×2): 100 mg via ORAL
  Filled 2021-03-09 (×2): qty 1

## 2021-03-09 MED ORDER — FLEET ENEMA 7-19 GM/118ML RE ENEM: 1.0000 | ENEMA | Freq: Once | RECTAL | Status: DC | PRN

## 2021-03-09 MED ORDER — GUAIFENESIN ER 600 MG PO TB12: 600.0000 mg | ORAL_TABLET | Freq: Two times a day (BID) | ORAL | Status: AC

## 2021-03-09 MED ORDER — LIDOCAINE-EPINEPHRINE 1 %-1:100000 IJ SOLN
INTRAMUSCULAR | Status: AC
  Filled 2021-03-09: qty 1

## 2021-03-09 MED ORDER — TICAGRELOR 90 MG PO TABS
90.0000 mg | ORAL_TABLET | Freq: Two times a day (BID) | ORAL | Status: DC
  Administered 2021-03-09 – 2021-03-27 (×35): 90 mg via ORAL
  Filled 2021-03-09 (×36): qty 1

## 2021-03-09 MED ORDER — POTASSIUM CHLORIDE 20 MEQ PO PACK
40.0000 meq | PACK | Freq: Once | ORAL | Status: AC
  Administered 2021-03-09: 40 meq via ORAL
  Filled 2021-03-09: qty 2

## 2021-03-09 MED ORDER — DULOXETINE HCL 30 MG PO CPEP
30.0000 mg | ORAL_CAPSULE | Freq: Every day | ORAL | Status: DC
  Administered 2021-03-10 – 2021-03-27 (×17): 30 mg via ORAL
  Filled 2021-03-09 (×18): qty 1

## 2021-03-09 MED ORDER — ATORVASTATIN CALCIUM 80 MG PO TABS
80.0000 mg | ORAL_TABLET | Freq: Every day | ORAL | Status: DC
  Administered 2021-03-10 – 2021-03-27 (×17): 80 mg via ORAL
  Filled 2021-03-09 (×18): qty 1

## 2021-03-09 MED ORDER — ASPIRIN EC 81 MG PO TBEC
81.0000 mg | DELAYED_RELEASE_TABLET | Freq: Every day | ORAL | Status: DC
  Administered 2021-03-10 – 2021-03-27 (×17): 81 mg via ORAL
  Filled 2021-03-09 (×18): qty 1

## 2021-03-09 MED ORDER — ENOXAPARIN SODIUM 40 MG/0.4ML IJ SOSY
40.0000 mg | PREFILLED_SYRINGE | INTRAMUSCULAR | Status: DC
  Administered 2021-03-10 – 2021-03-27 (×18): 40 mg via SUBCUTANEOUS
  Filled 2021-03-09 (×18): qty 0.4

## 2021-03-09 MED ORDER — GUAIFENESIN ER 600 MG PO TB12
600.0000 mg | ORAL_TABLET | Freq: Two times a day (BID) | ORAL | Status: DC
  Administered 2021-03-09 – 2021-03-10 (×2): 600 mg via ORAL
  Filled 2021-03-09 (×2): qty 1

## 2021-03-09 MED ORDER — SODIUM CHLORIDE 0.9 % IV SOLN: INTRAVENOUS | Status: DC | PRN

## 2021-03-09 MED ORDER — PANTOPRAZOLE SODIUM 40 MG PO TBEC
40.0000 mg | DELAYED_RELEASE_TABLET | Freq: Two times a day (BID) | ORAL | Status: DC
  Administered 2021-03-09 – 2021-03-27 (×35): 40 mg via ORAL
  Filled 2021-03-09 (×36): qty 1

## 2021-03-09 MED ORDER — GUAIFENESIN-DM 100-10 MG/5ML PO SYRP: 5.0000 mL | ORAL_SOLUTION | Freq: Four times a day (QID) | ORAL | Status: DC | PRN

## 2021-03-09 MED ORDER — SODIUM CHLORIDE 0.9 % IV SOLN: 3.0000 g | Freq: Four times a day (QID) | INTRAVENOUS | Status: AC

## 2021-03-09 MED ORDER — POLYETHYLENE GLYCOL 3350 17 G PO PACK
17.0000 g | PACK | Freq: Every day | ORAL | Status: DC | PRN
  Administered 2021-03-17: 17 g via ORAL
  Filled 2021-03-09: qty 1

## 2021-03-09 MED ORDER — DEXTROMETHORPHAN POLISTIREX ER 30 MG/5ML PO SUER
30.0000 mg | Freq: Three times a day (TID) | ORAL | 0 refills | Status: AC
Start: 2021-03-09 — End: ?
  Filled 2021-03-09: qty 89, 6d supply, fill #0

## 2021-03-09 MED ORDER — TICAGRELOR 90 MG PO TABS: 90.0000 mg | ORAL_TABLET | Freq: Two times a day (BID) | ORAL | Status: AC

## 2021-03-09 MED ORDER — ALBUTEROL SULFATE (2.5 MG/3ML) 0.083% IN NEBU: 3.0000 mL | INHALATION_SOLUTION | Freq: Four times a day (QID) | RESPIRATORY_TRACT | Status: DC | PRN

## 2021-03-09 MED ORDER — PROCHLORPERAZINE MALEATE 5 MG PO TABS: 5.0000 mg | ORAL_TABLET | Freq: Four times a day (QID) | ORAL | Status: DC | PRN

## 2021-03-09 SURGICAL SUPPLY — 2 items
PACK LOOP INSERTION (CUSTOM PROCEDURE TRAY) ×3 IMPLANT
SYSTEM MONITOR REVEAL LINQ II (Prosthesis & Implant Heart) ×1 IMPLANT

## 2021-03-09 NOTE — Progress Notes (Signed)
Patient arrived on unit. Rehab schedule, medications and plan of care reviewed, patient states an understanding. Patient is Ax4 and has no complications noted at this time. Patient reports pain 0 out of 10. Patient educated on use of call light. Sindi Beckworth G Meiling Hendriks  

## 2021-03-09 NOTE — Progress Notes (Signed)
[name], MD  Physician Physical Medicine and Rehabilitation PMR Pre-admission    Signed Date of Service:  03/08/2021  3:54 PM   Signed      Show:Clear all [x] Written[x] Templated[x] Copied  Added by: [x] Cristina Gong, RN[x] [name], MD  [] Hover for details                                                                                                                                                                                                                                                                                                                                                                                                                                                                               PMR Admission Coordinator Pre-Admission Assessment   Patient: David [name] is an 81 y.o., male MRN: [id number] DOB: February 20, 1941 Height: 6' (182.9 cm) Weight: 95 kg Insurance Information HMO:     PPO:      PCP:      IPA:      80/20:      OTHER:  PRIMARY: [employer] Medicare      Policy#: [id number]      Subscriber: pt CM Name: David [name]      Phone#: [phone]  option #7     Fax#: [phone] Pre-Cert#: [id number]     approved for 7 days Employer:  Benefits:  Phone #: [phone]     Name: 1/9 Eff. Date: 02/28/2021     Deduct: none      Out of Pocket Max: $3900      Life Max: none CIR: $295 co pay per day days 1 until 5      SNF: no copay days 1 until 20; $196 co pay per day days 21 until 40 Outpatient: $20 per visit     Co-Pay: visits per medical neccesity Home Health: 100%      Co-Pay: visits per medical neccesity DME: 80%     Co-Pay: 20% Providers: in network  SECONDARY: none   Financial Counselor:       Phone#:    [facility] for patients in Inpatient  Rehabilitation Facilities with attached Privacy Act [place] Records was provided and verbally reviewed with: Patient and Family   Emergency Contact Information Contact Information       Name Relation Home Work Mobile    [place] Spouse [phone]   Gillis Daughter     [phone]    Spencer,Cheryl Daughter     [phone]         Current Medical History  Patient Admitting Diagnosis: CVA   History of Present Illness:  81 year old male with a history of coronary artery disease, hyperlipidemia, hypertension, remote history of prostate cancer, renal oncocytoma of the right kidney who presented with left-sided weakness, dysarthria and visual neglect.  Presented on 03/05/2021.  He was on the toilet when he developed left arm and left leg weakness and his wife observed that he was unable to stand.  She called emergency medical services and he was transferred ported to the [facility] emergency department.  Code stroke was initiated he was outside the window for thrombolytics and his CT/CTA of the head and neck revealed right ICA occlusion.  Code IR activated.  He underwent diagnostic cerebral angiogram with right carotid stenting with cerebral protective device by Dr. Karenann Cai.  Findings revealed atherosclerotic changes of the right carotid bifurcation with associated thrombus at approximately 80 to 90% stenosis in the right carotid bulb.  A 6 to 8 x 40 mm XACT carotid stent was utilized.  Ticagrelor 90 mg twice daily and aspirin 81 mg daily initiated.  MRI of the brain showed right frontal cortical small infarcts and left CR small infarct.  Telemetry showed frequent PVCs but no atrial fibrillation.Place on [place]. He has developed persistent cough with worsening leukocytosis.  Family states he has had persistent cough for approximately 2 to 3 weeks prior to admission and he completed an outpatient course of Augmentin and steroids.  He also  tested positive for influenza A on 02/26/2021 and completed a course of Tamiflu.  COVID and flu swabs were performed on 03/06/2021 and were negative.  Chest x-ray on 03/07/2021 shows bilateral infiltrates concerning for multifocal pneumonia.  He has been afebrile with normal SaO2 on 2 L of oxygen.  Unasyn was started.  SLP reevaluation obtained and modified barium swallow performed on 03/29/2021.  Findings were moderate to severe pharyngeal dysphagia likely acute on chronic in nature.  Recommended dysphagia 3 diet with nectar thick liquids.  In addition, IV fluids were discontinued and he was given Lasix x1 dose.   2D echocardiogram ejection fraction 65 to 70%.LDL equal 106.Hemoglobin A1c equals 6.3, VTE prophylaxis  Lovenox 40 mg daily.    Complete NIHSS TOTAL: 5   Patient's medical record from [facility] has been reviewed by the rehabilitation admission coordinator and physician.   Past Medical History      Past Medical History:  Diagnosis Date   Allergy     Arthritis      gen.  and left hip   BPH (benign prostatic hypertrophy)     Coronary artery disease     GERD (gastroesophageal reflux disease)      occasional   History of kidney stones      2007   Hyperlipidemia     Hypertension     Kidney tumor     LBP (low back pain) 06/2005    L5 radicular symptoms; MRI of LS spine severe spondylosis at L4-L5 with central cancal stenosis    Nocturia     Organic impotence     Prostate cancer ([place]) 10/2011    (Low grade) [facility] - Dr. Junious Silk   Pyelonephritis 03/05/2013   Renal oncocytoma of right kidney 01/23/2013    As 3.2 x 3.5 cm enhancing exophytic mass projecting off the lower pole. This is consistent with a renal cell carcinoma. No retroperitoneal lymphadenopathy.    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 yes   Family History   family history includes Cancer in his mother; Colon cancer  (age of onset: 36) in his mother; Coronary artery disease in an other family member; Hyperlipidemia in an other family member; Pulmonary fibrosis in his brother and father.   Current Medications   Current Facility-Administered Medications:    acetaminophen (TYLENOL) tablet 650 mg, 650 mg, Oral, Q6H PRN, Donnetta Simpers, MD, 650 mg at 03/08/21 0341   albuterol (PROVENTIL) (2.5 MG/3ML) 0.083% nebulizer solution 3 mL, 3 mL, Inhalation, Q6H PRN, Patrecia Pour, MD   Ampicillin-Sulbactam (UNASYN) 3 g in sodium chloride 0.9 % 100 mL IVPB, 3 g, Intravenous, Q6H, Girguis, David, MD, Last Rate: 200 mL/hr at 03/11/2021 0626, 3 g at 03/18/2021 1740   aspirin EC tablet 81 mg, 81 mg, Oral, Daily, de Sindy Messing, Ramer, MD, 81 mg at 03/26/2021 1049   atorvastatin (LIPITOR) tablet 80 mg, 80 mg, Oral, Daily, Derek Jack, MD, 80 mg at 03/18/2021 1049   benzonatate (TESSALON) capsule 100 mg, 100 mg, Oral, TID, Vance Gather B, MD, 100 mg at 03/06/2021 1048   Chlorhexidine Gluconate Cloth 2 % PADS 6 each, 6 each, Topical, Q0600, Derek Jack, MD, 6 each at 03/08/21 0646   dextromethorphan (DELSYM) 30 MG/5ML liquid 30 mg, 30 mg, Oral, TID, Dwyane Dee, MD, 30 mg at 03/22/2021 1048   DULoxetine (CYMBALTA) DR capsule 30 mg, 30 mg, Oral, Daily, Su Monks M, MD, 30 mg at 03/26/2021 1048   enoxaparin (LOVENOX) injection 40 mg, 40 mg, Subcutaneous, Q24H, Derek Jack, MD, 40 mg at 03/08/21 1741   ezetimibe (ZETIA) tablet 10 mg, 10 mg, Oral, Daily, Su Monks M, MD, 10 mg at 03/17/2021 1049   guaiFENesin (MUCINEX) 12 hr tablet 600 mg, 600 mg, Oral, BID, Dwyane Dee, MD, 600 mg at 03/21/2021 1049   iohexol (OMNIPAQUE) 300 MG/ML solution 100 mL, 100 mL, Intra-arterial, Once PRN, de Sindy Messing, [place], MD   labetalol (NORMODYNE) injection 5-20 mg, 5-20 mg, Intravenous, Q2H PRN, Rosalin Hawking, MD   magic mouthwash, 5 mL, Oral, TID PRN,  Rosalin Hawking, MD, 5 mL at 03/06/21 2012   MEDLINE mouth rinse, 15 mL,  Mouth Rinse, BID, Rosalin Hawking, MD, 15 mL at 03/04/2021 0940   pantoprazole (PROTONIX) EC tablet 40 mg, 40 mg, Oral, BID, Derek Jack, MD, 40 mg at 03/13/2021 1048   potassium chloride (KLOR-CON) packet 40 mEq, 40 mEq, Oral, Once, Patrecia Pour, MD   senna-docusate (Senokot-S) tablet 1 tablet, 1 tablet, Oral, QHS PRN, Derek Jack, MD   sodium chloride flush (NS) 0.9 % injection 3 mL, 3 mL, Intravenous, Once, Gareth Morgan, MD   ticagrelor (BRILINTA) tablet 90 mg, 90 mg, Oral, BID, Derek Jack, MD, 90 mg at 03/11/2021 1049   Patients Current Diet:  Diet Order                  Diet Heart Room service appropriate? Yes; Fluid consistency: Thin  Diet effective now                     1/10 Dysphagia 3 diet with nectar thick liquids and meds crushed in puree   Precautions / Restrictions Precautions Precautions: Fall Precaution Comments: L hemiparesis Restrictions Weight Bearing Restrictions: No    Has the patient had 2 or more falls or a fall with injury in the past year? No   Prior Activity Level Community (5-7x/wk): independent, driving, active   Prior Functional Level Self Care: Did the patient need help bathing, dressing, using the toilet or eating? Independent   Indoor Mobility: Did the patient need assistance with walking from room to room (with or without device)? Independent   Stairs: Did the patient need assistance with internal or external stairs (with or without device)? Independent   Functional Cognition: Did the patient need help planning regular tasks such as shopping or remembering to take medications? Independent   Patient Information Are you of Hispanic, Latino/a,or Spanish origin?: A. No, not of Hispanic, Latino/a, or Spanish origin What is your race?: A. White Do you need or want an interpreter to communicate with a doctor or health care staff?: 0. No   Patient's Response To:  Health Literacy and Transportation Is the patient able to respond to  health literacy and transportation needs?: Yes Health Literacy - How often do you need to have someone help you when you read instructions, pamphlets, or other written material from your doctor or pharmacy?: Never In the past 12 months, has lack of transportation kept you from medical appointments or from getting medications?: No In the past 12 months, has lack of transportation kept you from meetings, work, or from getting things needed for daily living?: No   Development worker, international aid / [place] Devices/Equipment: None Home Equipment: Conservation officer, nature (2 wheels), Grab bars - toilet, Grab bars - tub/shower   Prior Device Use: Indicate devices/aids used by the patient prior to current illness, exacerbation or injury? None of the above   Current Functional Level Cognition   Arousal/Alertness: Awake/alert Overall Cognitive Status: Impaired/Different from baseline Current Attention Level: Selective Orientation Level: Oriented to person, Oriented to place, Oriented to situation Following Commands: Follows one step commands consistently, Follows one step commands with increased time Safety/Judgement: Decreased awareness of safety General Comments: Will further assess Attention: Focused, Sustained Focused Attention: Appears intact Focused Attention Impairment: Verbal complex Sustained Attention: Impaired Sustained Attention Impairment: Verbal complex Memory: Impaired Memory Impairment:  (Immediate: 5/5; delayed:1/5; with cues: 4/4) Awareness: Impaired Awareness Impairment: Emergent impairment Executive Function: Sequencing, Technical brewer:  (clock  drawing: 4/4) Organizing: Impaired Organizing Impairment: Verbal complex (backward digit span: 0/2)    Extremity Assessment (includes Sensation/Coordination)   Upper Extremity Assessment: Defer to OT evaluation LUE Deficits / Details: Greater  proximal strength although weak shoulder; able to complete hand to move and fle3x  shoulder @ 90; isolated movements; gross grasp/release; unable to make full fist; unable to oppose thumb to digits with any strength; absent in-hand manipulation skills; attempts to use functionally, repetitively, even when he is not being successful with use of L hand LUE Coordination: decreased fine motor, decreased gross motor  Lower Extremity Assessment: LLE deficits/detail LLE Deficits / Details: grossly 4-/5 LLE Coordination: decreased fine motor, decreased gross motor     ADLs   Overall ADL's : Needs assistance/impaired Eating/Feeding: Supervision/ safety, Set up Grooming: Minimal assistance Upper Body Bathing: Minimal assistance Lower Body Bathing: Moderate assistance, Sit to/from stand Upper Body Dressing : Moderate assistance Lower Body Dressing: Maximal assistance Toilet Transfer: Minimal assistance, +2 for physical assistance Toileting- Clothing Manipulation and Hygiene: Total assistance Toileting - Clothing Manipulation Details (indicate cue type and reason): incontinenet of urine; staes he had prostrate surgery adn hasn't had a "wet night since surgery"; unsure if urinary incontinence is new Functional mobility during ADLs: Minimal assistance, +2 for physical assistance General ADL Comments: Very slow movement patterns     Mobility   Overal bed mobility: Needs Assistance Bed Mobility: Supine to Sit Supine to sit: Min assist General bed mobility comments: using R rail and able to get legs off bed, but difficulty lifting trunk due to proximity to rail, assist to lift trunk and scoot L hip with cues     Transfers   Overall transfer level: Needs assistance Equipment used: Rolling walker (2 wheels) Transfers: Sit to/from Stand Sit to Stand: Min assist, From elevated surface Bed to/from chair/wheelchair/BSC transfer type:: Step pivot Step pivot transfers: Min assist, +2 physical assistance     Ambulation / Gait / Stairs / Wheelchair Mobility    Ambulation/Gait Ambulation/Gait assistance: Herbalist (Feet): 30 Feet Assistive device: Rolling walker (2 wheels) Gait Pattern/deviations: Step-to pattern, Step-through pattern, Decreased stride length, Trunk flexed, Decreased dorsiflexion - left, Decreased step length - left General Gait Details: assist for L hand on RW and for walker management in small space in room, assist for balance, cues for posture and walker safety     Posture / Balance Balance Overall balance assessment: Needs assistance Sitting-balance support: Feet supported Sitting balance-Leahy Scale: Fair Standing balance support: Bilateral upper extremity supported Standing balance-Leahy Scale: Poor     Special needs/care consideration Flu positive 02/26/2021 Hgb A1c 6.3 LOOP to be placed prior to discharge to [place]: Spouse/significant other  Lives With: Spouse Available Help at Discharge:  (1 daughter local , other daughter out of town) Type of Home: [place]: One level Home Access: Stairs to enter Entrance Stairs-Rails: Can reach both, Left, Right Entrance Stairs-Number of Steps: 2 Bathroom Shower/Tub: Gaffer, Chiropodist: Handicapped height Bathroom Accessibility: Yes How Accessible: Accessible via walker, Accessible via wheelchair [place]: No   Discharge [place] for Discharge Living Setting: Patient's home, Lives with (comment) (wife) Type of Home at Discharge: House Discharge Home Layout: One level Discharge Home Access: Stairs to enter Entrance Stairs-Rails: Right, Left, Can reach both Entrance Stairs-Number of Steps: 2 Discharge Bathroom Shower/Tub: Walk-in shower, Tub/shower unit Discharge Bathroom Toilet: Handicapped height Discharge Bathroom Accessibility: Yes How Accessible:  Accessible via walker Does the patient have any problems obtaining your medications?: No    Social/Family/Support Systems Patient Roles: Spouse, Caregiver (caregiver for wife since she had knee surgery Oct /2022) Contact Information: wife, Vickii Chafe Anticipated Caregiver: daughters and family members Anticipated Caregiver's Contact Information: see contacts Ability/Limitations of Caregiver: wife knee issues since 11/2020 Caregiver Availability: 24/7 Discharge Plan Discussed with Primary Caregiver: Yes Is Caregiver In Agreement with Plan?: Yes Does Caregiver/Family have Issues with Lodging/Transportation while Pt is in Rehab?: No   Goals Patient/Family Goal for Rehab: supervision to min assist with PT and OT, supervision with SLP Expected length of stay: ELOS 10 to 14 days Pt/Family Agrees to Admission and willing to participate: Yes Program Orientation Provided & Reviewed with Pt/Caregiver Including Roles  & Responsibilities: Yes   Decrease burden of Care through IP rehab admission: n/a   Possible need for SNF placement upon discharge: not anticipated   Patient Condition: I have reviewed medical records from [facility], spoken with CM, and patient, spouse, and daughter. I met with patient at the bedside for inpatient rehabilitation assessment.  Patient will benefit from ongoing PT, OT, and SLP, can actively participate in 3 hours of therapy a day 5 days of the week, and can make measurable gains during the admission.  Patient will also benefit from the coordinated team approach during an Inpatient Acute Rehabilitation admission.  The patient will receive intensive therapy as well as Rehabilitation physician, nursing, social worker, and care management interventions.  Due to bladder management, bowel management, safety, skin/wound care, disease management, medication administration, pain management, and patient education the patient requires 24 hour a day rehabilitation nursing.  The patient is currently min to mod assist overall with mobility and basic ADLs.  Discharge setting  and therapy post discharge at home with home health is anticipated.  Patient has agreed to participate in the Acute Inpatient Rehabilitation Program and will admit today.   Preadmission Screen Completed By:  Cleatrice Burke, 03/15/2021 11:37 AM ______________________________________________________________________   Discussed status with Dr. Naaman Plummer on  03/05/2021 at 1136 and received approval for admission today.   Admission Coordinator:  Cleatrice Burke, RN, time  1136 Date  03/11/2021    Assessment/Plan: Diagnosis: right MCA infarct  Does the need for close, 24 hr/day Medical supervision in concert with the patient's rehab needs make it unreasonable for this patient to be served in a less intensive setting? Yes Co-Morbidities requiring supervision/potential complications: cad, htn, prostate cancer Due to bladder management, bowel management, safety, skin/wound care, disease management, medication administration, pain management, and patient education, does the patient require 24 hr/day rehab nursing? Yes Does the patient require coordinated care of a physician, rehab nurse, PT, OT, and SLP to address physical and functional deficits in the context of the above medical diagnosis(es)? Yes Addressing deficits in the following areas: balance, endurance, locomotion, strength, transferring, bowel/bladder control, bathing, dressing, feeding, grooming, toileting, cognition, speech, language, and psychosocial support Can the patient actively participate in an intensive therapy program of at least 3 hrs of therapy 5 days a week? Yes The potential for patient to make measurable gains while on inpatient rehab is excellent Anticipated functional outcomes upon discharge from inpatient rehab: supervision and min assist PT, supervision and min assist OT, supervision and min assist SLP Estimated rehab length of stay to reach the above functional goals is: 10-14 days Anticipated discharge destination:  Home 10. Overall Rehab/Functional Prognosis: excellent     MD Signature: [name], MD, [facility]  [place] Director Rehabilitation Services 03/19/2021          Revision History                          Note Details  Author [name], MD File Time 03/08/2021 11:56 AM  Author Type Physician Status Signed  Last Editor [name], MD Service Physical Medicine and [place] # [id number] Admit Date 03/05/2021

## 2021-03-09 NOTE — Progress Notes (Addendum)
Inpatient Rehabilitation Admissions Coordinator   Insurance has approved Cir admit. I met with patient with his wife and daughter at bedside . All in agreement to Cir today. I contacted Dr Bonner Puna, Dr Erlinda Hong, acute team and TOC. I will make the arrangements to admit today after Loop placement. I have contacted Oda Kilts, PA with cardiology for LOOP placement.  Danne Baxter, RN, MSN Rehab Admissions Coordinator [phone] 03/24/2021 10:38 AM

## 2021-03-09 NOTE — H&P (Signed)
Physical Medicine and Rehabilitation Admission H&P     CC: Functional deficits due to right MCA stroke   HPI: David [name] is a right-handed 81 year old male with a history of coronary artery disease, hyperlipidemia, hypertension, remote history of prostate cancer, renal oncocytoma of the right kidney who presented with left-sided weakness, dysarthria and visual neglect.  This occurred at approximately 10 AM on the morning of March 05, 2021.  He was on the toilet when he developed left arm and left leg weakness and his wife observed that he was unable to stand.  She called emergency medical services and he was transferred ported to the [facility] emergency department.  Code stroke was initiated he was outside the window for thrombolytics and his CT/CTA of the head and neck revealed right ICA occlusion.  Code IR activated.  He underwent diagnostic cerebral angiogram with right carotid stenting with cerebral protective device by Dr. Karenann Cai.  Findings revealed atherosclerotic changes of the right carotid bifurcation with associated thrombus at approximately 80 to 90% stenosis in the right carotid bulb.  A 6 to 8 x 40 mm XACT carotid stent was utilized.  Ticagrelor 90 mg twice daily and aspirin 81 mg daily initiated.  MRI of the brain showed right frontal cortical small infarcts and left CR small infarct.  Telemetry showed frequent PVCs but no atrial fibrillation.  Dr. Stanford Breed recommended loop recorder to rule out atrial fibrillation.   He has developed persistent cough with worsening leukocytosis.  Family states he has had persistent cough for approximately 2 to 3 weeks prior to admission and he completed an outpatient course of Augmentin and steroids.  He also tested positive for influenza A on 02/26/2021 and completed a course of Tamiflu.  COVID and flu swabs were performed on 03/06/2021 and were negative.  Chest x-ray on 03/07/2021 shows bilateral infiltrates concerning for multifocal  pneumonia.  He has been afebrile with normal SaO2 on 2 L of oxygen.  Unasyn was started.  SLP reevaluation obtained and modified barium swallow performed on 03/26/2021.  Findings were moderate to severe pharyngeal dysphagia likely acute on chronic in nature.  Recommended dysphagia 3 diet with nectar thick liquids.  In addition, IV fluids were discontinued and he was given Lasix x1 dose.   2D echocardiogram ejection fraction 65 to 70% LDL equal 106 Hemoglobin A1c equals 6.3, VTE prophylaxis Lovenox 40 mg daily   Review of Systems  Constitutional:  Positive for malaise/fatigue.  Eyes:  Negative for blurred vision and double vision.  Respiratory:  Positive for cough.   Cardiovascular:  Negative for chest pain and leg swelling.  Gastrointestinal:  Negative for abdominal pain, nausea and vomiting.  Genitourinary:  Positive for dysuria.  Musculoskeletal:  Positive for back pain and joint pain.  Neurological:  Positive for weakness and headaches. Negative for dizziness.       Complains of frontal headache      Past Medical History:  Diagnosis Date   Allergy     Arthritis      gen.  and left hip   BPH (benign prostatic hypertrophy)     Coronary artery disease     GERD (gastroesophageal reflux disease)      occasional   History of kidney stones      2007   Hyperlipidemia     Hypertension     Kidney tumor     LBP (low back pain) 06/2005    L5 radicular symptoms; MRI of LS spine severe  spondylosis at L4-L5 with central cancal stenosis    Nocturia     Organic impotence     Prostate cancer ([place]) 10/2011    (Low grade) [facility] - Dr. Junious Silk   Pyelonephritis 03/05/2013   Renal oncocytoma of right kidney 01/23/2013    As 3.2 x 3.5 cm enhancing exophytic mass projecting off the lower pole. This is consistent with a renal cell carcinoma. No retroperitoneal lymphadenopathy.    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   Wears glasses           Past Surgical History:  Procedure Laterality Date    COLONOSCOPY   02/15/2006, 2014    Internal hemorrhoids (Dr [name]), last in 2014   [place] Right 1980   IR ANGIO INTRA EXTRACRAN SEL INTERNAL CAROTID UNI R MOD SED   03/05/2021   IR INTRAVSC STENT CERV CAROTID W/EMB-PROT MOD SED INCL ANGIO   03/05/2021   IR US GUIDE VASC ACCESS RIGHT   03/05/2021   LYMPHADENECTOMY Bilateral 02/15/2013    Procedure: LYMPHADENECTOMY WITH INDOCYANINE GREEN DYE INJECTION;  Surgeon: [name], MD;  Location: [facility];  Service: Urology;  Laterality: Bilateral;   PROSTATE BIOPSY N/A 12/18/2012    Procedure: BIOPSY TRANSRECTAL ULTRASONIC PROSTATE (TUBP);  Surgeon: [name], MD;  Location: [facility];  Service: Urology;  Laterality: N/A;   PROSTATE BIOPSY   12/05/11    gleason 6, 3/12 cores   RADIOLOGY WITH ANESTHESIA N/A 03/05/2021    Procedure: IR WITH ANESTHESIA;  Surgeon: Radiologist, Medication, MD;  Location: [facility];  Service: Radiology;  Laterality: N/A;   REPAIR UMBILICAL AND VENTRAL HERNIA'S W/ MESH   08-21-2009   RIGHT/LEFT HEART CATH AND CORONARY ANGIOGRAPHY N/A 08/06/2019    Procedure: RIGHT/LEFT HEART CATH AND CORONARY ANGIOGRAPHY;  Surgeon: [name], Peter M, MD;  Location: [facility];  Service: Cardiovascular;  Laterality: N/A;   ROBOT ASSISTED LAPAROSCOPIC RADICAL PROSTATECTOMY N/A 02/15/2013    Procedure: ROBOTIC ASSISTED LAPAROSCOPIC RADICAL PROSTATECTOMY;  Surgeon: [name], MD;  Location: [facility];  Service: Urology;  Laterality: N/A;   ROBOTIC ASSITED PARTIAL NEPHRECTOMY Right 02/15/2013    Procedure: ROBOTIC ASSITED PARTIAL NEPHRECTOMY;  Surgeon: [name], MD;  Location: [facility];  Service: Urology;  Laterality: Right;   SPERMATOCELECTOMY Left 12/18/2012    Procedure: LEFT SPERMATOCELECTOMY;  Surgeon: [name], MD;  Location: [facility];  Service: Urology;  Laterality: Left;   TONSILLECTOMY AND ADENOIDECTOMY   as child   TOTAL KNEE ARTHROPLASTY Left 05/26/2020     Procedure: TOTAL KNEE ARTHROPLASTY;  Surgeon: Paralee Cancel, MD;  Location: [facility];  Service: Orthopedics;  Laterality: Left;  70 mins   TRANSURETHRAL RESECTION OF PROSTATE   AGE 81         Family History  Problem Relation Age of Onset   Cancer Mother          colon   Colon cancer Mother 82   Pulmonary fibrosis Father          father owned a Medical sales representative business (father blamed inhalation of chemicals and fumes)   Pulmonary fibrosis Brother     Coronary artery disease Other     Hyperlipidemia Other     Colon polyps Neg Hx     Esophageal cancer Neg Hx     Rectal cancer Neg Hx     Stomach cancer Neg Hx      Social History:  reports that he quit smoking about  40 years ago. His smoking use included cigarettes. He has a 2.50 pack-year smoking history. He has never used smokeless tobacco. He reports current alcohol use of about 1.0 standard drink per week. He reports that he does not currently use drugs after having used the following drugs: Flunitrazepam. Allergies:       Allergies  Allergen Reactions   Codeine Itching      Severe hiccups   Ace Inhibitors Other (See Comments)      REACTION: Cough   Amlodipine        LE swelling   Hydrocodone Hives and Itching      Hiccoughs   Percocet [Oxycodone-Acetaminophen]        Severe hiccups          Medications Prior to Admission  Medication Sig Dispense Refill   acetaminophen (TYLENOL) 500 MG tablet Take 500-1,000 mg by mouth every 6 (six) hours as needed for moderate pain.       albuterol (VENTOLIN HFA) 108 (90 Base) MCG/ACT inhaler Inhale 2 puffs into the lungs every 6 (six) hours as needed for wheezing or shortness of breath (Cough). 18 g 1   aspirin EC 81 MG tablet Take 1 tablet (81 mg total) by mouth daily. 100 tablet 3   atorvastatin (LIPITOR) 80 MG tablet TAKE 1 TABLET(80 MG) BY MOUTH DAILY AT 6 PM 90 tablet 3   cetirizine (ZYRTEC) 10 MG tablet Take 1 tablet (10 mg total) by mouth daily. 90 tablet 2   DULoxetine (CYMBALTA) 30 MG  capsule Take 30 mg by mouth daily.       ezetimibe (ZETIA) 10 MG tablet Take 10 mg by mouth daily.       fluticasone (FLONASE) 50 MCG/ACT nasal spray Place 2 sprays into both nostrils daily. 18 mL 2   olmesartan (BENICAR) 40 MG tablet Take 1 tablet (40 mg total) by mouth daily. 90 tablet 3   pantoprazole (PROTONIX) 40 MG tablet TAKE 1 TABLET BY MOUTH TWICE DAILY 60 tablet 11   Vitamin D, Cholecalciferol, 50 MCG (2000 UT) CAPS Take 2,000 Units by mouth daily.        benzonatate (TESSALON) 200 MG capsule Take 1 capsule (200 mg total) by mouth 3 (three) times daily as needed for cough. (Patient not taking: Reported on 03/05/2021) 60 capsule 1   chlorpheniramine-HYDROcodone (TUSSIONEX PENNKINETIC ER) 10-8 MG/5ML SUER Take 5 ml po bid x 1 week, then 2.5 ml bid x 1 week, then taper off and d/c (Patient not taking: Reported on 03/05/2021) 230 mL 0   ezetimibe (ZETIA) 10 MG tablet Take 1 tablet (10 mg total) by mouth daily. 90 tablet 3   ipratropium (ATROVENT) 0.06 % nasal spray Place 2 sprays into both nostrils 4 (four) times daily. As needed for nasal congestion, runny nose (Patient not taking: Reported on 03/05/2021) 15 mL 0   oseltamivir (TAMIFLU) 75 MG capsule Take 1 capsule (75 mg total) by mouth every 12 (twelve) hours. (Patient not taking: Reported on 03/05/2021) 10 capsule 0      Drug Regimen Review  Drug regimen was reviewed and remains appropriate with no significant issues identified   Home: Home Living Family/patient expects to be discharged to:: Skilled nursing facility Living Arrangements: Spouse/significant other Available Help at Discharge:  (1 daughter local , other daughter out of town) Type of Home: House Home Access: Stairs to enter Technical brewer of Steps: 2 Entrance Stairs-Rails: Can reach both, Left, Right Home Layout: One level Bathroom Shower/Tub: Walk-in shower, Tub/shower unit  Bathroom Toilet: Handicapped height Bathroom Accessibility: Yes Home Equipment: Chartered certified accountant (2 wheels), Grab bars - toilet, Grab bars - tub/shower  Lives With: Spouse   Functional History: Prior Function Prior Level of Function : Independent/Modified Independent   Functional Status:  Mobility: Bed Mobility Overal bed mobility: Needs Assistance Bed Mobility: Supine to Sit Supine to sit: Min assist General bed mobility comments: using R rail and able to get legs off bed, but difficulty lifting trunk due to proximity to rail, assist to lift trunk and scoot L hip with cues Transfers Overall transfer level: Needs assistance Equipment used: Rolling walker (2 wheels) Transfers: Sit to/from Stand Sit to Stand: Min assist, From elevated surface Bed to/from chair/wheelchair/BSC transfer type:: Step pivot Step pivot transfers: Min assist, +2 physical assistance Ambulation/Gait Ambulation/Gait assistance: Min assist Gait Distance (Feet): 30 Feet Assistive device: Rolling walker (2 wheels) Gait Pattern/deviations: Step-to pattern, Step-through pattern, Decreased stride length, Trunk flexed, Decreased dorsiflexion - left, Decreased step length - left General Gait Details: assist for L hand on RW and for walker management in small space in room, assist for balance, cues for posture and walker safety   ADL: ADL Overall ADL's : Needs assistance/impaired Eating/Feeding: Supervision/ safety, Set up Grooming: Minimal assistance Upper Body Bathing: Minimal assistance Lower Body Bathing: Moderate assistance, Sit to/from stand Upper Body Dressing : Moderate assistance Lower Body Dressing: Maximal assistance Toilet Transfer: Minimal assistance, +2 for physical assistance Toileting- Clothing Manipulation and Hygiene: Total assistance Toileting - Clothing Manipulation Details (indicate cue type and reason): incontinenet of urine; staes he had prostrate surgery adn hasn't had a "wet night since surgery"; unsure if urinary incontinence is new Functional mobility during ADLs: Minimal  assistance, +2 for physical assistance General ADL Comments: Very slow movement patterns   Cognition: Cognition Overall Cognitive Status: Impaired/Different from baseline Arousal/Alertness: Awake/alert Orientation Level: Oriented to person, Oriented to place, Oriented to situation Year: 2023 Month: January Day of Week: Incorrect Attention: Focused, Sustained Focused Attention: Appears intact Focused Attention Impairment: Verbal complex Sustained Attention: Impaired Sustained Attention Impairment: Verbal complex Memory: Impaired Memory Impairment:  (Immediate: 5/5; delayed:1/5; with cues: 4/4) Awareness: Impaired Awareness Impairment: Emergent impairment Executive Function: Sequencing, Technical brewer:  (clock drawing: 4/4) Organizing: Impaired Organizing Impairment: Verbal complex (backward digit span: 0/2) Cognition Arousal/Alertness: Awake/alert Behavior During Therapy: Flat affect Overall Cognitive Status: Impaired/Different from baseline Area of Impairment: Attention, Following commands Current Attention Level: Selective Following Commands: Follows one step commands consistently, Follows one step commands with increased time Safety/Judgement: Decreased awareness of safety Awareness: Emergent Problem Solving: Slow processing General Comments: Will further assess   Physical Exam: Blood pressure (!) 148/68, pulse 66, temperature 98.2 F (36.8 C), temperature source Axillary, resp. rate (!) 21, height 6' (1.829 m), weight 95 kg, SpO2 96 %. Physical Exam Constitutional:      General: He is not in acute distress.    Appearance: He is not ill-appearing.  HENT:     Head: Normocephalic and atraumatic.     Right Ear: External ear normal.     Left Ear: External ear normal.  Eyes:     Extraocular Movements: Extraocular movements intact.     Conjunctiva/sclera: Conjunctivae normal.     Pupils: Pupils are equal, round, and reactive to light.  Cardiovascular:     Rate  and Rhythm: Normal rate and regular rhythm.     Heart sounds: No murmur heard.   No gallop.  Pulmonary:     Effort: Pulmonary effort is normal. No respiratory distress.  Breath sounds: Rhonchi present. No wheezing or rales.     Comments: Mouth breathing, able to complete full sentences with mild dyspnea.  Upper airway noise appreciated Abdominal:     General: Abdomen is flat. Bowel sounds are normal. There is no distension.     Palpations: Abdomen is soft.     Tenderness: There is no abdominal tenderness.  Musculoskeletal:        General: No swelling.     Cervical back: Normal range of motion.  Skin:    General: Skin is warm.  Neurological:     Mental Status: He is alert and oriented to person, place, and time.     Comments: Fairly alert, oriented to person, place, mon/year, reason he's here. Speech low volume and dysarthric. Left CVII and tongue deviaiton. LUE 3- to 4/5. LLE 3-/5 prox to distal with some delays processing, +/- apraxia. Mild left inattention. Seems to sense pain and light touch equally in all 4's.  DTR's 1+, toes up right foot. No resting hypertonicity.   Psychiatric:     Comments: Pt flat, but generally pleasant and cooperative      Lab Results Last 48 Hours        Results for orders placed or performed during the hospital encounter of 03/05/21 (from the past 48 hour(s))  Strep pneumoniae urinary antigen     Status: None    Collection Time: 03/07/21  5:49 PM  Result Value Ref Range    Strep Pneumo Urinary Antigen NEGATIVE NEGATIVE      Comment:        Infection due to S. pneumoniae cannot be absolutely ruled out since the antigen present may be below the detection limit of the test. Performed at [facility], [address], [place], [place] [address]    Respiratory (~20 pathogens) panel by PCR     Status: None    Collection Time: 03/07/21  6:20 PM    Specimen: Nasopharyngeal Swab; Respiratory  Result Value Ref Range    Adenovirus NOT DETECTED NOT  DETECTED    Coronavirus 229E NOT DETECTED NOT DETECTED      Comment: (NOTE) The Coronavirus on the Respiratory Panel, DOES NOT test for the novel  Coronavirus (2019 nCoV)      Coronavirus HKU1 NOT DETECTED NOT DETECTED    Coronavirus NL63 NOT DETECTED NOT DETECTED    Coronavirus OC43 NOT DETECTED NOT DETECTED    Metapneumovirus NOT DETECTED NOT DETECTED    Rhinovirus / Enterovirus NOT DETECTED NOT DETECTED    Influenza A NOT DETECTED NOT DETECTED    Influenza B NOT DETECTED NOT DETECTED    Parainfluenza Virus 1 NOT DETECTED NOT DETECTED    Parainfluenza Virus 2 NOT DETECTED NOT DETECTED    Parainfluenza Virus 3 NOT DETECTED NOT DETECTED    Parainfluenza Virus 4 NOT DETECTED NOT DETECTED    Respiratory Syncytial Virus NOT DETECTED NOT DETECTED    Bordetella pertussis NOT DETECTED NOT DETECTED    Bordetella Parapertussis NOT DETECTED NOT DETECTED    Chlamydophila pneumoniae NOT DETECTED NOT DETECTED    Mycoplasma pneumoniae NOT DETECTED NOT DETECTED      Comment: Performed at [facility], [place] [address] [place], [place] [address]  Basic metabolic panel     Status: Abnormal    Collection Time: 03/08/21  3:50 AM  Result Value Ref Range    Sodium 139 135 - 145 mmol/L    Potassium 3.3 (L) 3.5 - 5.1 mmol/L  Chloride 108 98 - 111 mmol/L    CO2 22 22 - 32 mmol/L    Glucose, Bld 115 (H) 70 - 99 mg/dL      Comment: Glucose reference range applies only to samples taken after fasting for at least 8 hours.    BUN 21 8 - 23 mg/dL    Creatinine, Ser 1.08 0.61 - 1.24 mg/dL    Calcium 9.4 8.9 - 10.3 mg/dL    GFR, Estimated >60 >60 mL/min      Comment: (NOTE) Calculated using the CKD-EPI Creatinine Equation (2021)      Anion gap 9 5 - 15      Comment: Performed at [facility] [address], [place], [place] [address]  Procalcitonin     Status: None    Collection Time: 03/08/21  3:50 AM  Result Value Ref Range    Procalcitonin 0.45 ng/mL      Comment:         Interpretation: PCT (Procalcitonin) <= 0.5 ng/mL: Systemic infection (sepsis) is not likely. Local bacterial infection is possible. (NOTE)       Sepsis PCT Algorithm           Lower Respiratory Tract                                      Infection PCT Algorithm    ----------------------------     ----------------------------         PCT < 0.25 ng/mL                PCT < 0.10 ng/mL           Strongly encourage             Strongly discourage   discontinuation of antibiotics    initiation of antibiotics    ----------------------------     -----------------------------       PCT 0.25 - 0.50 ng/mL            PCT 0.10 - 0.25 ng/mL               OR       >80% decrease in PCT            Discourage initiation of                                            antibiotics      Encourage discontinuation           of antibiotics    ----------------------------     -----------------------------         PCT >= 0.50 ng/mL              PCT 0.26 - 0.50 ng/mL               AND        <80% decrease in PCT             Encourage initiation of                                             antibiotics       Encourage continuation  of antibiotics    ----------------------------     -----------------------------        PCT >= 0.50 ng/mL                  PCT > 0.50 ng/mL               AND         increase in PCT                  Strongly encourage                                      initiation of antibiotics    Strongly encourage escalation           of antibiotics                                     -----------------------------                                           PCT <= 0.25 ng/mL                                                 OR                                        > 80% decrease in PCT                                       Discontinue / Do not initiate                                             antibiotics   Performed at [facility] [address], [place], [place] [address]    CBC with Differential/Platelet     Status: Abnormal    Collection Time: 03/08/21  3:50 AM  Result Value Ref Range    WBC 18.3 (H) 4.0 - 10.5 K/uL    RBC 3.75 (L) 4.22 - 5.81 MIL/uL    Hemoglobin 11.0 (L) 13.0 - 17.0 g/dL    HCT 33.2 (L) 39.0 - 52.0 %    MCV 88.5 80.0 - 100.0 fL    MCH 29.3 26.0 - 34.0 pg    MCHC 33.1 30.0 - 36.0 g/dL    RDW 14.7 11.5 - 15.5 %    Platelets 260 150 - 400 K/uL    nRBC 0.0 0.0 - 0.2 %    Neutrophils Relative % 87 %    Neutro Abs 16.0 (H) 1.7 - 7.7 K/uL    Lymphocytes Relative 6 %    Lymphs Abs 1.1 0.7 - 4.0 K/uL    Monocytes Relative 6 %    Monocytes Absolute 1.1 (H) 0.1 - 1.0 K/uL  Eosinophils Relative 0 %    Eosinophils Absolute 0.0 0.0 - 0.5 K/uL    Basophils Relative 0 %    Basophils Absolute 0.0 0.0 - 0.1 K/uL    Immature Granulocytes 1 %    Abs Immature Granulocytes 0.16 (H) 0.00 - 0.07 K/uL      Comment: Performed at [facility] [address], [place], [place] [address]  Basic metabolic panel     Status: Abnormal    Collection Time: 03/08/2021  1:40 AM  Result Value Ref Range    Sodium 139 135 - 145 mmol/L    Potassium 3.2 (L) 3.5 - 5.1 mmol/L    Chloride 111 98 - 111 mmol/L    CO2 20 (L) 22 - 32 mmol/L    Glucose, Bld 119 (H) 70 - 99 mg/dL      Comment: Glucose reference range applies only to samples taken after fasting for at least 8 hours.    BUN 23 8 - 23 mg/dL    Creatinine, Ser 0.99 0.61 - 1.24 mg/dL    Calcium 9.1 8.9 - 10.3 mg/dL    GFR, Estimated >60 >60 mL/min      Comment: (NOTE) Calculated using the CKD-EPI Creatinine Equation (2021)      Anion gap 8 5 - 15      Comment: Performed at [facility] [address], [place], [place] [address]  Procalcitonin     Status: None    Collection Time: 03/09/2021  1:40 AM  Result Value Ref Range    Procalcitonin 0.74 ng/mL      Comment:        Interpretation: PCT > 0.5 ng/mL and <= 2 ng/mL: Systemic infection (sepsis) is  possible, but other conditions are known to elevate PCT as well. (NOTE)       Sepsis PCT Algorithm           Lower Respiratory Tract                                      Infection PCT Algorithm    ----------------------------     ----------------------------         PCT < 0.25 ng/mL                PCT < 0.10 ng/mL           Strongly encourage             Strongly discourage   discontinuation of antibiotics    initiation of antibiotics    ----------------------------     -----------------------------       PCT 0.25 - 0.50 ng/mL            PCT 0.10 - 0.25 ng/mL               OR       >80% decrease in PCT            Discourage initiation of                                            antibiotics      Encourage discontinuation           of antibiotics    ----------------------------     -----------------------------  PCT >= 0.50 ng/mL              PCT 0.26 - 0.50 ng/mL                AND       <80% decrease in PCT             Encourage initiation of                                             antibiotics       Encourage continuation           of antibiotics    ----------------------------     -----------------------------        PCT >= 0.50 ng/mL                  PCT > 0.50 ng/mL               AND         increase in PCT                  Strongly encourage                                      initiation of antibiotics    Strongly encourage escalation           of antibiotics                                     -----------------------------                                           PCT <= 0.25 ng/mL                                                 OR                                        > 80% decrease in PCT                                       Discontinue / Do not initiate                                             antibiotics   Performed at [facility] [address], [place], [place] [address]    CBC with Differential/Platelet     Status:  Abnormal    Collection Time: 03/08/2021  1:40 AM  Result Value Ref Range    WBC 16.3 (H) 4.0 - 10.5 K/uL    RBC 3.65 (L) 4.22 - 5.81 MIL/uL  Hemoglobin 10.6 (L) 13.0 - 17.0 g/dL    HCT 32.4 (L) 39.0 - 52.0 %    MCV 88.8 80.0 - 100.0 fL    MCH 29.0 26.0 - 34.0 pg    MCHC 32.7 30.0 - 36.0 g/dL    RDW 14.9 11.5 - 15.5 %    Platelets 264 150 - 400 K/uL    nRBC 0.0 0.0 - 0.2 %    Neutrophils Relative % 84 %    Neutro Abs 13.8 (H) 1.7 - 7.7 K/uL    Lymphocytes Relative 8 %    Lymphs Abs 1.3 0.7 - 4.0 K/uL    Monocytes Relative 6 %    Monocytes Absolute 1.0 0.1 - 1.0 K/uL    Eosinophils Relative 1 %    Eosinophils Absolute 0.1 0.0 - 0.5 K/uL    Basophils Relative 0 %    Basophils Absolute 0.0 0.0 - 0.1 K/uL    Immature Granulocytes 1 %    Abs Immature Granulocytes 0.15 (H) 0.00 - 0.07 K/uL      Comment: Performed at [facility] [address], [place], [place] [address]       Imaging Results (Last 48 hours)  DG CHEST PORT 1 VIEW   Result Date: 03/07/2021 CLINICAL DATA:  Respiratory abnormalities. EXAM: PORTABLE CHEST 1 VIEW COMPARISON:  Radiograph 02/26/2021 FINDINGS: Lung volumes are low. Stable heart size and mediastinal contours. Patchy opacity in both lower lung zones. Mild additional patchy opacity in the right suprahilar lung. Vascular congestion. No pleural effusion. Anti lordotic positioning. IMPRESSION: 1. Low lung volumes with patchy bibasilar and right suprahilar opacities, may represent multifocal pneumonia, particularly at the right lung base. 2. Vascular congestion. Electronically Signed   By: Keith Rake M.D.   On: 03/07/2021 17:35    DG Swallowing Func-Speech Pathology   Result Date: 03/30/2021 Table formatting from the original result was not included. Objective Swallowing Evaluation: Type of Study: MBS-Modified Barium Swallow Study  Patient Details Name: JAXSTON CHOHAN MRN: [id number] Date of Birth: May 07, 1940 Today's Date: 03/10/2021 Time: SLP Start Time  (ACUTE ONLY): 0900 -SLP Stop Time (ACUTE ONLY): 0930 SLP Time Calculation (min) (ACUTE ONLY): 30 min Past Medical History: Past Medical History: Diagnosis Date  Allergy   Arthritis   gen.  and left hip  BPH (benign prostatic hypertrophy)   Coronary artery disease   GERD (gastroesophageal reflux disease)   occasional  History of kidney stones   2007  Hyperlipidemia   Hypertension   Kidney tumor   LBP (low back pain) 06/2005  L5 radicular symptoms; MRI of LS spine severe spondylosis at L4-L5 with central cancal stenosis   Nocturia   Organic impotence   Prostate cancer ([place]) 10/2011  (Low grade) [facility] - Dr. Junious Silk  Pyelonephritis 03/05/2013  Renal oncocytoma of right kidney 01/23/2013  As 3.2 x 3.5 cm enhancing exophytic mass projecting off the lower pole. This is consistent with a renal cell carcinoma. No retroperitoneal lymphadenopathy.   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  Wears glasses  Past Surgical History: Past Surgical History: Procedure Laterality Date  COLONOSCOPY  02/15/2006, 2014  Internal hemorrhoids (Dr [name]), last in 2014  [place] Right 1980  IR ANGIO INTRA EXTRACRAN SEL INTERNAL CAROTID UNI R MOD SED  03/05/2021  IR INTRAVSC STENT CERV CAROTID W/EMB-PROT MOD SED INCL ANGIO  03/05/2021  IR US GUIDE VASC ACCESS RIGHT  03/05/2021  LYMPHADENECTOMY Bilateral 02/15/2013  Procedure: LYMPHADENECTOMY WITH INDOCYANINE GREEN DYE INJECTION;  Surgeon: Hubbard Robinson  [name], MD;  Location: [facility];  Service: Urology;  Laterality: Bilateral;  PROSTATE BIOPSY N/A 12/18/2012  Procedure: BIOPSY TRANSRECTAL ULTRASONIC PROSTATE (TUBP);  Surgeon: [name], MD;  Location: [facility];  Service: Urology;  Laterality: N/A;  PROSTATE BIOPSY  12/05/11  gleason 6, 3/12 cores  RADIOLOGY WITH ANESTHESIA N/A 03/05/2021  Procedure: IR WITH ANESTHESIA;  Surgeon: Radiologist, Medication, MD;  Location: [facility];  Service: Radiology;  Laterality: N/A;  REPAIR UMBILICAL AND VENTRAL HERNIA'S W/ MESH   08-21-2009  RIGHT/LEFT HEART CATH AND CORONARY ANGIOGRAPHY N/A 08/06/2019  Procedure: RIGHT/LEFT HEART CATH AND CORONARY ANGIOGRAPHY;  Surgeon: [name], Peter M, MD;  Location: [facility];  Service: Cardiovascular;  Laterality: N/A;  ROBOT ASSISTED LAPAROSCOPIC RADICAL PROSTATECTOMY N/A 02/15/2013  Procedure: ROBOTIC ASSISTED LAPAROSCOPIC RADICAL PROSTATECTOMY;  Surgeon: [name], MD;  Location: [facility];  Service: Urology;  Laterality: N/A;  ROBOTIC ASSITED PARTIAL NEPHRECTOMY Right 02/15/2013  Procedure: ROBOTIC ASSITED PARTIAL NEPHRECTOMY;  Surgeon: [name], MD;  Location: [facility];  Service: Urology;  Laterality: Right;  SPERMATOCELECTOMY Left 12/18/2012  Procedure: LEFT SPERMATOCELECTOMY;  Surgeon: [name], MD;  Location: [facility];  Service: Urology;  Laterality: Left;  TONSILLECTOMY AND ADENOIDECTOMY  as child  TOTAL KNEE ARTHROPLASTY Left 05/26/2020  Procedure: TOTAL KNEE ARTHROPLASTY;  Surgeon: Paralee Cancel, MD;  Location: [facility];  Service: Orthopedics;  Laterality: Left;  70 mins  TRANSURETHRAL RESECTION OF PROSTATE  AGE 40 HPI: Pt is a 81 y/o male presented to ED on 03/05/21 for L sided weakness. Recent flue dx. CTA showed R ICA occlusion. S/p cerebral angiogram and R carotid stenting on 1/6. MRI showed multifocal acute/early subacute cortical ischemia in anterior R MCA and small focus of acute/early subacute ischemia at L caudate head. Pt evaluated by SLP for dysarthria, MD then ordered swallow eval due to persistent pna and complaint of coughing while eating. PMH: HTN, CAD, prostate cancer  No data recorded  Recommendations for follow up therapy are one component of a multi-disciplinary discharge planning process, led by the attending physician.  Recommendations may be updated based on patient status, additional functional criteria and insurance authorization. Assessment / Plan / Recommendation Clinical Impressions 03/11/2021 Clinical Impression  Pt  demonstrates a moderate to severe pharyngeal dysphagia, likely acute on chronic in nature. Pt demonstrates structural changes in pharynx impacting function that are likely related to curvature of spinal column. Pt has initiation of swallow at pyriform sinuses, leading to instances of sensed aspiration of thin liquids before the swallow without expectoration (PAS 7). Pt additionally has moderate residue with liquid and solid boluses due to restricted bolus flow (decreased epiglottic deflection and UES opening) appearing secondary to curvature of cervical spine. SLP able to cue pt to complete a chin tuck which reduced pyriform residue and also complete preventative throat clears and second swallow to clear nectar thick penetrate. Pt fatigued significantly during exam and generalized weakness is increasing pts risk at this time. Question if a dys 3 (mech soft diet) and nectar thick liqudis will be tolerated despite efforts. Will attempt modified diet and strengthing interventions. Pt recommended to f/u with AIR. SLP Visit Diagnosis Dysphagia, pharyngeal phase (R13.13) Attention and concentration deficit following -- Frontal lobe and executive function deficit following -- Impact on safety and function Moderate aspiration risk;Risk for inadequate nutrition/hydration   Treatment Recommendations 03/02/2021 Treatment Recommendations Therapy as outlined in treatment plan below   Prognosis 03/01/2021 Prognosis for Safe Diet Advancement Fair Barriers to Reach Goals --  Barriers/Prognosis Comment -- Diet Recommendations 03/12/2021 SLP Diet Recommendations Dysphagia 3 (Mech soft) solids;Nectar thick liquid Liquid Administration via Cup;Straw Medication Administration Crushed with puree Compensations Slow rate;Small sips/bites;Follow solids with liquid;Clear throat intermittently;Effortful swallow;Chin tuck;Multiple dry swallows after each bite/sip Postural Changes Remain semi-upright after after feeds/meals (Comment);Seated upright  at 90 degrees   Other Recommendations 03/24/2021 Recommended Consults -- Oral Care Recommendations Oral care BID Other Recommendations Have oral suction available Follow Up Recommendations Acute inpatient rehab (3hours/day) Assistance recommended at discharge -- Functional Status Assessment Patient has had a recent decline in their functional status and demonstrates the ability to make significant improvements in function in a reasonable and predictable amount of time. Frequency and Duration  03/08/2021 Speech Therapy Frequency (ACUTE ONLY) min 2x/week Treatment Duration 2 weeks   Oral Phase 03/04/2021 Oral Phase Impaired Oral - Pudding Teaspoon -- Oral - Pudding Cup -- Oral - Honey Teaspoon -- Oral - Honey Cup -- Oral - Nectar Teaspoon -- Oral - Nectar Cup Lingual/palatal residue Oral - Nectar Straw Lingual/palatal residue Oral - Thin Teaspoon Lingual/palatal residue Oral - Thin Cup Lingual/palatal residue Oral - Thin Straw Lingual/palatal residue Oral - Puree Lingual/palatal residue Oral - Mech Soft Lingual/palatal residue Oral - Regular -- Oral - Multi-Consistency -- Oral - Pill -- Oral Phase - Comment --  Pharyngeal Phase 03/10/2021 Pharyngeal Phase Impaired Pharyngeal- Pudding Teaspoon -- Pharyngeal -- Pharyngeal- Pudding Cup -- Pharyngeal -- Pharyngeal- Honey Teaspoon -- Pharyngeal -- Pharyngeal- Honey Cup -- Pharyngeal -- Pharyngeal- Nectar Teaspoon -- Pharyngeal -- Pharyngeal- Nectar Cup Delayed swallow initiation-pyriform sinuses;Reduced epiglottic inversion;Penetration/Apiration after swallow;Pharyngeal residue - valleculae;Pharyngeal residue - pyriform;Compensatory strategies attempted (with notebox) Pharyngeal Material enters airway, CONTACTS cords and not ejected out;Material enters airway, remains ABOVE vocal cords and not ejected out;Material does not enter airway Pharyngeal- Nectar Straw Delayed swallow initiation-pyriform sinuses;Reduced epiglottic inversion;Penetration/Apiration after  swallow;Pharyngeal residue - valleculae;Pharyngeal residue - pyriform;Compensatory strategies attempted (with notebox) Pharyngeal Material enters airway, CONTACTS cords and not ejected out;Material enters airway, remains ABOVE vocal cords and not ejected out;Material does not enter airway Pharyngeal- Thin Teaspoon Delayed swallow initiation-pyriform sinuses;Reduced epiglottic inversion;Penetration/Aspiration before swallow;Trace aspiration Pharyngeal Material enters airway, passes BELOW cords and not ejected out despite cough attempt by patient Pharyngeal- Thin Cup Delayed swallow initiation-pyriform sinuses;Reduced epiglottic inversion;Penetration/Apiration after swallow;Pharyngeal residue - valleculae;Pharyngeal residue - pyriform;Compensatory strategies attempted (with notebox) Pharyngeal Material enters airway, CONTACTS cords and not ejected out;Material enters airway, remains ABOVE vocal cords and not ejected out;Material does not enter airway Pharyngeal- Thin Straw Delayed swallow initiation-pyriform sinuses;Reduced epiglottic inversion;Penetration/Apiration after swallow;Pharyngeal residue - valleculae;Pharyngeal residue - pyriform;Compensatory strategies attempted (with notebox) Pharyngeal Material enters airway, CONTACTS cords and not ejected out;Material enters airway, remains ABOVE vocal cords and not ejected out;Material does not enter airway Pharyngeal- Puree Delayed swallow initiation-pyriform sinuses;Reduced epiglottic inversion;Pharyngeal residue - valleculae;Pharyngeal residue - pyriform;Compensatory strategies attempted (with notebox) Pharyngeal -- Pharyngeal- Mechanical Soft Delayed swallow initiation-pyriform sinuses;Reduced epiglottic inversion;Pharyngeal residue - valleculae;Pharyngeal residue - pyriform;Compensatory strategies attempted (with notebox) Pharyngeal -- Pharyngeal- Regular -- Pharyngeal -- Pharyngeal- Multi-consistency -- Pharyngeal -- Pharyngeal- Pill -- Pharyngeal -- Pharyngeal  Comment --  Cervical Esophageal Phase  03/08/2021 Cervical Esophageal Phase Impaired Pudding Teaspoon -- Pudding Cup -- Honey Teaspoon -- Honey Cup -- Nectar Teaspoon -- Nectar Cup Reduced cricopharyngeal relaxation;Esophageal backflow into cervical esophagus;Esophageal backflow into the pharynx Nectar Straw Reduced cricopharyngeal relaxation;Esophageal backflow into cervical esophagus;Esophageal backflow into the pharynx Thin Teaspoon -- Thin Cup -- Thin Straw -- Puree -- Mechanical Soft -- Regular -- Multi-consistency -- Pill --  Cervical Esophageal Comment see impression DeBlois, Katherene Ponto 03/06/2021, 10:07 AM                      US THYROID   Result Date: 03/08/2021 CLINICAL DATA:  Incidental on CT. EXAM: THYROID ULTRASOUND TECHNIQUE: Ultrasound examination of the thyroid gland and adjacent soft tissues was performed. COMPARISON:  03/05/2021, 02/27/2020 FINDINGS: Parenchymal Echotexture: Mildly heterogenous Isthmus: 0.6 cm, previously 1.3 cm Right lobe: 4.2 x 1.4 x 1.7 cm, previously 4.5 x 2.3 x 1.8 cm Left lobe: 3.9 x 1.8 x 1.6 cm, previously 4.8 x 1.8 x 2.1 cm _________________________________________________________ Estimated total number of nodules >/= 1 cm: 1 Number of spongiform nodules >/=  2 cm not described below (TR1): 0 Number of mixed cystic and solid nodules >/= 1.5 cm not described below ([place]): 0 _________________________________________________________ Nodule # 1: Prior biopsy: No Location: Right; Superior Maximum size: 0.7 cm; Other 2 dimensions: 0.6 x 0.5 cm, previously, 0.6 x 0.6 x 0.5 cm Composition: cannot determine (2) Echogenicity: hypoechoic (2) Shape: not taller-than-wide (0) Margins: ill-defined (0) Echogenic foci: peripheral calcifications (2) ACR TI-RADS total points: 6. ACR TI-RADS risk category:  TR4 (4-6 points). Significant change in size (>/= 20% in two dimensions and minimal increase of 2 mm): No Change in features: No Change in ACR TI-RADS risk category: No ACR TI-RADS  recommendations: Given size (<0.9 cm) and appearance, this nodule does NOT meet TI-RADS criteria for biopsy or dedicated follow-up. _________________________________________________________ Nodule # 2: Prior biopsy: No Location: Right; Inferior Maximum size: 1.1 cm; Other 2 dimensions: 1.0 x 0.9 cm, previously, 1.1 x 1.0 x 0.9 cm Composition: solid/almost completely solid (2) Echogenicity: hypoechoic (2) Shape: not taller-than-wide (0) Margins: smooth (0) Echogenic foci: none (0) ACR TI-RADS total points: 4. ACR TI-RADS risk category:  TR4 (4-6 points). Significant change in size (>/= 20% in two dimensions and minimal increase of 2 mm): No Change in features: No Change in ACR TI-RADS risk category: No ACR TI-RADS recommendations: *Given size (>/= 1 - 1.4 cm) and appearance, a follow-up ultrasound in 1 year should be considered based on TI-RADS criteria. _________________________________________________________ No cervical lymphadenopathy identified. IMPRESSION: 1. The right paratracheal nodule described on recent CT head neck is not visualized sonographically. In retrospect, this CT finding is similar to 06/13/2013 comparison chest CT and appears extrathyroidal. 2. Unchanged appearance of right inferior solid thyroid nodule (labeled 2, 1.1 cm) which again meets criteria (TI-RADS category 4) for 1 year ultrasound surveillance. The above is in keeping with the ACR TI-RADS recommendations - J Am Coll Radiol 2017;14:587-595. Ruthann Cancer, MD [facility] Radiology Electronically Signed   By: Ruthann Cancer M.D.   On: 03/08/2021 08:19             Medical Problem List and Plan: 1. Functional deficits secondary to multifocal acute/early subacute cortical ischemia in the anterior right MCA territory, likely large vessel source from critical right ICA stenosis.  Status post right ICA stenting.  Left caudate small infarct>> procedure related versus occult atrial  fibrillation.             -patient may shower             -ELOS/Goals: 10-15 days, supervision to min assist with PT, OT, and SLP 2.  Antithrombotics: -DVT/anticoagulation:  Pharmaceutical: Lovenox             -antiplatelet therapy: Aspirin, Brilinta 3. Pain Management: Tylenol 4. Mood: LCSW to evaluate and provide emotional  support             -antipsychotic agents: N/A 5. Neuropsych: This patient is capable of making decisions on his own behalf. 6. Skin/Wound Care: Routine skin care checks 7. Fluids/Electrolytes/Nutrition: Routine ins and outs and follow up chemistries 8.  Persistent cough with imaging evidence of patchy bibasilar and right suprahilar opacities             -- On Unasyn for suspected multifocal pneumonia; possible aspiration             --Continue pulmonary toilet, mucolytics, albuterol nebs prn             -persistent leukocytosis--f/u labs             -currently afebrile 9.  Frequent PVCs: Sees Dr. Stanford Breed as an outpatient  --recommends loop recorder>> placed 1/10 10: Hypertension: Start Benicar 40 mg daily  11: Hyperlipidemia: Zetia and Lipitor 12: EtOH use: advised to drink no more than 1 alcoholic beverage dialy 13: Overweight: BMI = 28.5. Rec: weight loss, diet and exercise as appropriate 14: CKD stage 3a: serum Cr = 0.99. Monitor 16: Hypokalemia: 1/10>>oral repletion, follow-up BMP in am 17: Anemia: asymptomatic --follow-up CBC and monitor 18: Thyroid nodule: follow-up ultrasound in one year recommended 19. Dysphagia: continue D3 with nectars, aspiration precautions             -advance per SLP       Barbie Banner, PA-C 03/08/2021   I have personally performed a face to face diagnostic evaluation of this patient and formulated the key components of the plan.  Additionally, I have personally reviewed laboratory data, imaging studies, as well as relevant notes and concur with the physician assistant's documentation above.  The patient's status has not  changed from the original H&P.  Any changes in documentation from the acute care chart have been noted above.  [name], MD, [facility]

## 2021-03-09 NOTE — H&P (Signed)
Physical Medicine and Rehabilitation Admission H&P    CC: Functional deficits due to right MCA stroke  HPI: [name] is a right-handed 81 year old male with a history of coronary artery disease, hyperlipidemia, hypertension, remote history of prostate cancer, renal oncocytoma of the right kidney who presented with left-sided weakness, dysarthria and visual neglect.  This occurred at approximately 10 AM on the morning of March 05, 2021.  He was on the toilet when he developed left arm and left leg weakness and his wife observed that he was unable to stand.  She called emergency medical services and he was transferred ported to the [facility] emergency department.  Code stroke was initiated he was outside the window for thrombolytics and his CT/CTA of the head and neck revealed right ICA occlusion.  Code IR activated.  He underwent diagnostic cerebral angiogram with right carotid stenting with cerebral protective device by Dr. Karenann Cai.  Findings revealed atherosclerotic changes of the right carotid bifurcation with associated thrombus at approximately 80 to 90% stenosis in the right carotid bulb.  A 6 to 8 x 40 mm XACT carotid stent was utilized.  Ticagrelor 90 mg twice daily and aspirin 81 mg daily initiated.  MRI of the brain showed right frontal cortical small infarcts and left CR small infarct.  Telemetry showed frequent PVCs but no atrial fibrillation.  Dr. Stanford Breed recommended loop recorder to rule out atrial fibrillation.  He has developed persistent cough with worsening leukocytosis.  Family states he has had persistent cough for approximately 2 to 3 weeks prior to admission and he completed an outpatient course of Augmentin and steroids.  He also tested positive for influenza A on 02/26/2021 and completed a course of Tamiflu.  COVID and flu swabs were performed on 03/06/2021 and were negative.  Chest x-ray on 03/07/2021 shows bilateral infiltrates concerning for multifocal  pneumonia.  He has been afebrile with normal SaO2 on 2 L of oxygen.  Unasyn was started.  SLP reevaluation obtained and modified barium swallow performed on 03/12/2021.  Findings were moderate to severe pharyngeal dysphagia likely acute on chronic in nature.  Recommended dysphagia 3 diet with nectar thick liquids.  In addition, IV fluids were discontinued and he was given Lasix x1 dose.  2D echocardiogram ejection fraction 65 to 70% LDL equal 106 Hemoglobin A1c equals 6.3, VTE prophylaxis Lovenox 40 mg daily  Review of Systems  Constitutional:  Positive for malaise/fatigue.  Eyes:  Negative for blurred vision and double vision.  Respiratory:  Positive for cough.   Cardiovascular:  Negative for chest pain and leg swelling.  Gastrointestinal:  Negative for abdominal pain, nausea and vomiting.  Genitourinary:  Positive for dysuria.  Musculoskeletal:  Positive for back pain and joint pain.  Neurological:  Positive for weakness and headaches. Negative for dizziness.       Complains of frontal headache  Past Medical History:  Diagnosis Date   Allergy    Arthritis    gen.  and left hip   BPH (benign prostatic hypertrophy)    Coronary artery disease    GERD (gastroesophageal reflux disease)    occasional   History of kidney stones    2007   Hyperlipidemia    Hypertension    Kidney tumor    LBP (low back pain) 06/2005   L5 radicular symptoms; MRI of LS spine severe spondylosis at L4-L5 with central cancal stenosis    Nocturia    Organic impotence    Prostate cancer ([place])  10/2011   (Low grade) [facility] - Dr. Junious Silk   Pyelonephritis 03/05/2013   Renal oncocytoma of right kidney 01/23/2013   As 3.2 x 3.5 cm enhancing exophytic mass projecting off the lower pole. This is consistent with a renal cell carcinoma. No retroperitoneal lymphadenopathy.    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   Wears glasses    Past Surgical History:  Procedure Laterality Date   COLONOSCOPY  02/15/2006, 2014    Internal hemorrhoids (Dr [name]), last in 2014   [place] Right 1980   IR ANGIO INTRA EXTRACRAN SEL INTERNAL CAROTID UNI R MOD SED  03/05/2021   IR INTRAVSC STENT CERV CAROTID W/EMB-PROT MOD SED INCL ANGIO  03/05/2021   IR US GUIDE VASC ACCESS RIGHT  03/05/2021   LYMPHADENECTOMY Bilateral 02/15/2013   Procedure: LYMPHADENECTOMY WITH INDOCYANINE GREEN DYE INJECTION;  Surgeon: [name], MD;  Location: [facility];  Service: Urology;  Laterality: Bilateral;   PROSTATE BIOPSY N/A 12/18/2012   Procedure: BIOPSY TRANSRECTAL ULTRASONIC PROSTATE (TUBP);  Surgeon: [name], MD;  Location: [facility];  Service: Urology;  Laterality: N/A;   PROSTATE BIOPSY  12/05/11   gleason 6, 3/12 cores   RADIOLOGY WITH ANESTHESIA N/A 03/05/2021   Procedure: IR WITH ANESTHESIA;  Surgeon: Radiologist, Medication, MD;  Location: [place];  Service: Radiology;  Laterality: N/A;   REPAIR UMBILICAL AND VENTRAL HERNIA'S W/ MESH  08-21-2009   RIGHT/LEFT HEART CATH AND CORONARY ANGIOGRAPHY N/A 08/06/2019   Procedure: RIGHT/LEFT HEART CATH AND CORONARY ANGIOGRAPHY;  Surgeon: [name], Peter M, MD;  Location: [facility];  Service: Cardiovascular;  Laterality: N/A;   ROBOT ASSISTED LAPAROSCOPIC RADICAL PROSTATECTOMY N/A 02/15/2013   Procedure: ROBOTIC ASSISTED LAPAROSCOPIC RADICAL PROSTATECTOMY;  Surgeon: [name], MD;  Location: [facility];  Service: Urology;  Laterality: N/A;   ROBOTIC ASSITED PARTIAL NEPHRECTOMY Right 02/15/2013   Procedure: ROBOTIC ASSITED PARTIAL NEPHRECTOMY;  Surgeon: [name], MD;  Location: [facility];  Service: Urology;  Laterality: Right;   SPERMATOCELECTOMY Left 12/18/2012   Procedure: LEFT SPERMATOCELECTOMY;  Surgeon: [name], MD;  Location: [facility];  Service: Urology;  Laterality: Left;   TONSILLECTOMY AND ADENOIDECTOMY  as child   TOTAL KNEE ARTHROPLASTY Left 05/26/2020   Procedure: TOTAL KNEE ARTHROPLASTY;  Surgeon:  Paralee Cancel, MD;  Location: [facility];  Service: Orthopedics;  Laterality: Left;  70 mins   TRANSURETHRAL RESECTION OF PROSTATE  AGE 44   Family History  Problem Relation Age of Onset   Cancer Mother        colon   Colon cancer Mother 1   Pulmonary fibrosis Father        father owned a Medical sales representative business (father blamed inhalation of chemicals and fumes)   Pulmonary fibrosis Brother    Coronary artery disease Other    Hyperlipidemia Other    Colon polyps Neg Hx    Esophageal cancer Neg Hx    Rectal cancer Neg Hx    Stomach cancer Neg Hx    Social History:  reports that he quit smoking about 40 years ago. His smoking use included cigarettes. He has a 2.50 pack-year smoking history. He has never used smokeless tobacco. He reports current alcohol use of about 1.0 standard drink per week. He reports that he does not currently use drugs after having used the following drugs: Flunitrazepam. Allergies:  Allergies  Allergen Reactions   Codeine Itching    Severe hiccups   Ace Inhibitors Other (See  Comments)    REACTION: Cough   Amlodipine     LE swelling   Hydrocodone Hives and Itching    Hiccoughs   Percocet [Oxycodone-Acetaminophen]     Severe hiccups   Medications Prior to Admission  Medication Sig Dispense Refill   acetaminophen (TYLENOL) 500 MG tablet Take 500-1,000 mg by mouth every 6 (six) hours as needed for moderate pain.     albuterol (VENTOLIN HFA) 108 (90 Base) MCG/ACT inhaler Inhale 2 puffs into the lungs every 6 (six) hours as needed for wheezing or shortness of breath (Cough). 18 g 1   aspirin EC 81 MG tablet Take 1 tablet (81 mg total) by mouth daily. 100 tablet 3   atorvastatin (LIPITOR) 80 MG tablet TAKE 1 TABLET(80 MG) BY MOUTH DAILY AT 6 PM 90 tablet 3   cetirizine (ZYRTEC) 10 MG tablet Take 1 tablet (10 mg total) by mouth daily. 90 tablet 2   DULoxetine (CYMBALTA) 30 MG capsule Take 30 mg by mouth daily.     ezetimibe (ZETIA) 10 MG tablet Take 10 mg by mouth daily.      fluticasone (FLONASE) 50 MCG/ACT nasal spray Place 2 sprays into both nostrils daily. 18 mL 2   olmesartan (BENICAR) 40 MG tablet Take 1 tablet (40 mg total) by mouth daily. 90 tablet 3   pantoprazole (PROTONIX) 40 MG tablet TAKE 1 TABLET BY MOUTH TWICE DAILY 60 tablet 11   Vitamin D, Cholecalciferol, 50 MCG (2000 UT) CAPS Take 2,000 Units by mouth daily.      benzonatate (TESSALON) 200 MG capsule Take 1 capsule (200 mg total) by mouth 3 (three) times daily as needed for cough. (Patient not taking: Reported on 03/05/2021) 60 capsule 1   chlorpheniramine-HYDROcodone (TUSSIONEX PENNKINETIC ER) 10-8 MG/5ML SUER Take 5 ml po bid x 1 week, then 2.5 ml bid x 1 week, then taper off and d/c (Patient not taking: Reported on 03/05/2021) 230 mL 0   ezetimibe (ZETIA) 10 MG tablet Take 1 tablet (10 mg total) by mouth daily. 90 tablet 3   ipratropium (ATROVENT) 0.06 % nasal spray Place 2 sprays into both nostrils 4 (four) times daily. As needed for nasal congestion, runny nose (Patient not taking: Reported on 03/05/2021) 15 mL 0   oseltamivir (TAMIFLU) 75 MG capsule Take 1 capsule (75 mg total) by mouth every 12 (twelve) hours. (Patient not taking: Reported on 03/05/2021) 10 capsule 0    Drug Regimen Review  Drug regimen was reviewed and remains appropriate with no significant issues identified  Home: Home Living Family/patient expects to be discharged to:: Skilled nursing facility Living Arrangements: Spouse/significant other Available Help at Discharge:  (1 daughter local , other daughter out of town) Type of Home: House Home Access: Stairs to enter Technical brewer of Steps: 2 Entrance Stairs-Rails: Can reach both, Left, Right Home Layout: One level Bathroom Shower/Tub: Gaffer, Chiropodist: Handicapped height Bathroom Accessibility: Yes Home Equipment: Conservation officer, nature (2 wheels), Grab bars - toilet, Grab bars - tub/shower  Lives With: Spouse   Functional History: Prior  Function Prior Level of Function : Independent/Modified Independent  Functional Status:  Mobility: Bed Mobility Overal bed mobility: Needs Assistance Bed Mobility: Supine to Sit Supine to sit: Min assist General bed mobility comments: using R rail and able to get legs off bed, but difficulty lifting trunk due to proximity to rail, assist to lift trunk and scoot L hip with cues Transfers Overall transfer level: Needs assistance Equipment used: Rolling walker (2  wheels) Transfers: Sit to/from Stand Sit to Stand: Min assist, From elevated surface Bed to/from chair/wheelchair/BSC transfer type:: Step pivot Step pivot transfers: Min assist, +2 physical assistance Ambulation/Gait Ambulation/Gait assistance: Min assist Gait Distance (Feet): 30 Feet Assistive device: Rolling walker (2 wheels) Gait Pattern/deviations: Step-to pattern, Step-through pattern, Decreased stride length, Trunk flexed, Decreased dorsiflexion - left, Decreased step length - left General Gait Details: assist for L hand on RW and for walker management in small space in room, assist for balance, cues for posture and walker safety    ADL: ADL Overall ADL's : Needs assistance/impaired Eating/Feeding: Supervision/ safety, Set up Grooming: Minimal assistance Upper Body Bathing: Minimal assistance Lower Body Bathing: Moderate assistance, Sit to/from stand Upper Body Dressing : Moderate assistance Lower Body Dressing: Maximal assistance Toilet Transfer: Minimal assistance, +2 for physical assistance Toileting- Clothing Manipulation and Hygiene: Total assistance Toileting - Clothing Manipulation Details (indicate cue type and reason): incontinenet of urine; staes he had prostrate surgery adn hasn't had a "wet night since surgery"; unsure if urinary incontinence is new Functional mobility during ADLs: Minimal assistance, +2 for physical assistance General ADL Comments: Very slow movement  patterns  Cognition: Cognition Overall Cognitive Status: Impaired/Different from baseline Arousal/Alertness: Awake/alert Orientation Level: Oriented to person, Oriented to place, Oriented to situation Year: 2023 Month: January Day of Week: Incorrect Attention: Focused, Sustained Focused Attention: Appears intact Focused Attention Impairment: Verbal complex Sustained Attention: Impaired Sustained Attention Impairment: Verbal complex Memory: Impaired Memory Impairment:  (Immediate: 5/5; delayed:1/5; with cues: 4/4) Awareness: Impaired Awareness Impairment: Emergent impairment Executive Function: Sequencing, Technical brewer:  (clock drawing: 4/4) Organizing: Impaired Organizing Impairment: Verbal complex (backward digit span: 0/2) Cognition Arousal/Alertness: Awake/alert Behavior During Therapy: Flat affect Overall Cognitive Status: Impaired/Different from baseline Area of Impairment: Attention, Following commands Current Attention Level: Selective Following Commands: Follows one step commands consistently, Follows one step commands with increased time Safety/Judgement: Decreased awareness of safety Awareness: Emergent Problem Solving: Slow processing General Comments: Will further assess  Physical Exam: Blood pressure (!) 148/68, pulse 66, temperature 98.2 F (36.8 C), temperature source Axillary, resp. rate (!) 21, height 6' (1.829 m), weight 95 kg, SpO2 96 %. Physical Exam Constitutional:      General: He is not in acute distress.    Appearance: He is not ill-appearing.  HENT:     Head: Normocephalic and atraumatic.     Right Ear: External ear normal.     Left Ear: External ear normal.  Eyes:     Extraocular Movements: Extraocular movements intact.     Conjunctiva/sclera: Conjunctivae normal.     Pupils: Pupils are equal, round, and reactive to light.  Cardiovascular:     Rate and Rhythm: Normal rate and regular rhythm.     Heart sounds: No murmur heard.    No gallop.  Pulmonary:     Effort: Pulmonary effort is normal. No respiratory distress.     Breath sounds: Rhonchi present. No wheezing or rales.     Comments: Mouth breathing, able to complete full sentences with mild dyspnea.  Upper airway noise appreciated Abdominal:     General: Abdomen is flat. Bowel sounds are normal. There is no distension.     Palpations: Abdomen is soft.     Tenderness: There is no abdominal tenderness.  Musculoskeletal:        General: No swelling.     Cervical back: Normal range of motion.  Skin:    General: Skin is warm.  Neurological:     Mental Status: He  is alert and oriented to person, place, and time.     Comments: Fairly alert, oriented to person, place, mon/year, reason he's here. Speech low volume and dysarthric. Left CVII and tongue deviaiton. LUE 3- to 4/5. LLE 3-/5 prox to distal with some delays processing, +/- apraxia. Mild left inattention. Seems to sense pain and light touch equally in all 4's.  DTR's 1+, toes up right foot. No resting hypertonicity.   Psychiatric:     Comments: Pt flat, but generally pleasant and cooperative    Results for orders placed or performed during the hospital encounter of 03/05/21 (from the past 48 hour(s))  Strep pneumoniae urinary antigen     Status: None   Collection Time: 03/07/21  5:49 PM  Result Value Ref Range   Strep Pneumo Urinary Antigen NEGATIVE NEGATIVE    Comment:        Infection due to S. pneumoniae cannot be absolutely ruled out since the antigen present may be below the detection limit of the test. Performed at [facility], [address], [place], [place] [address]   Respiratory (~20 pathogens) panel by PCR     Status: None   Collection Time: 03/07/21  6:20 PM   Specimen: Nasopharyngeal Swab; Respiratory  Result Value Ref Range   Adenovirus NOT DETECTED NOT DETECTED   Coronavirus 229E NOT DETECTED NOT DETECTED    Comment: (NOTE) The Coronavirus on the Respiratory Panel, DOES NOT  test for the novel  Coronavirus (2019 nCoV)    Coronavirus HKU1 NOT DETECTED NOT DETECTED   Coronavirus NL63 NOT DETECTED NOT DETECTED   Coronavirus OC43 NOT DETECTED NOT DETECTED   Metapneumovirus NOT DETECTED NOT DETECTED   Rhinovirus / Enterovirus NOT DETECTED NOT DETECTED   Influenza A NOT DETECTED NOT DETECTED   Influenza B NOT DETECTED NOT DETECTED   Parainfluenza Virus 1 NOT DETECTED NOT DETECTED   Parainfluenza Virus 2 NOT DETECTED NOT DETECTED   Parainfluenza Virus 3 NOT DETECTED NOT DETECTED   Parainfluenza Virus 4 NOT DETECTED NOT DETECTED   Respiratory Syncytial Virus NOT DETECTED NOT DETECTED   Bordetella pertussis NOT DETECTED NOT DETECTED   Bordetella Parapertussis NOT DETECTED NOT DETECTED   Chlamydophila pneumoniae NOT DETECTED NOT DETECTED   Mycoplasma pneumoniae NOT DETECTED NOT DETECTED    Comment: Performed at [facility], [place] [address] [place], [place] [address]  Basic metabolic panel     Status: Abnormal   Collection Time: 03/08/21  3:50 AM  Result Value Ref Range   Sodium 139 135 - 145 mmol/L   Potassium 3.3 (L) 3.5 - 5.1 mmol/L   Chloride 108 98 - 111 mmol/L   CO2 22 22 - 32 mmol/L   Glucose, Bld 115 (H) 70 - 99 mg/dL    Comment: Glucose reference range applies only to samples taken after fasting for at least 8 hours.   BUN 21 8 - 23 mg/dL   Creatinine, Ser 1.08 0.61 - 1.24 mg/dL   Calcium 9.4 8.9 - 10.3 mg/dL   GFR, Estimated >60 >60 mL/min    Comment: (NOTE) Calculated using the CKD-EPI Creatinine Equation (2021)    Anion gap 9 5 - 15    Comment: Performed at [facility] [address] [place], [place] [address]  Procalcitonin     Status: None   Collection Time: 03/08/21  3:50 AM  Result Value Ref Range   Procalcitonin 0.45 ng/mL    Comment:  Interpretation: PCT (Procalcitonin) <= 0.5 ng/mL: Systemic infection (sepsis) is not likely. Local bacterial infection is possible. (NOTE)       Sepsis PCT Algorithm            Lower Respiratory Tract                                      Infection PCT Algorithm    ----------------------------     ----------------------------         PCT < 0.25 ng/mL                PCT < 0.10 ng/mL          Strongly encourage             Strongly discourage   discontinuation of antibiotics    initiation of antibiotics    ----------------------------     -----------------------------       PCT 0.25 - 0.50 ng/mL            PCT 0.10 - 0.25 ng/mL               OR       >80% decrease in PCT            Discourage initiation of                                            antibiotics      Encourage discontinuation           of antibiotics    ----------------------------     -----------------------------         PCT >= 0.50 ng/mL              PCT 0.26 - 0.50 ng/mL               AND        <80% decrease in PCT             Encourage initiation of                                             antibiotics       Encourage continuation           of antibiotics    ----------------------------     -----------------------------        PCT >= 0.50 ng/mL                  PCT > 0.50 ng/mL               AND         increase in PCT                  Strongly encourage                                      initiation of antibiotics    Strongly encourage escalation           of antibiotics                                     -----------------------------  PCT <= 0.25 ng/mL                                                 OR                                        > 80% decrease in PCT                                      Discontinue / Do not initiate                                             antibiotics  Performed at [facility] [address] [place], [place] [address]   CBC with Differential/Platelet     Status: Abnormal   Collection Time: 03/08/21  3:50 AM  Result Value Ref Range   WBC 18.3 (H) 4.0 - 10.5 K/uL   RBC 3.75 (L) 4.22  - 5.81 MIL/uL   Hemoglobin 11.0 (L) 13.0 - 17.0 g/dL   HCT 33.2 (L) 39.0 - 52.0 %   MCV 88.5 80.0 - 100.0 fL   MCH 29.3 26.0 - 34.0 pg   MCHC 33.1 30.0 - 36.0 g/dL   RDW 14.7 11.5 - 15.5 %   Platelets 260 150 - 400 K/uL   nRBC 0.0 0.0 - 0.2 %   Neutrophils Relative % 87 %   Neutro Abs 16.0 (H) 1.7 - 7.7 K/uL   Lymphocytes Relative 6 %   Lymphs Abs 1.1 0.7 - 4.0 K/uL   Monocytes Relative 6 %   Monocytes Absolute 1.1 (H) 0.1 - 1.0 K/uL   Eosinophils Relative 0 %   Eosinophils Absolute 0.0 0.0 - 0.5 K/uL   Basophils Relative 0 %   Basophils Absolute 0.0 0.0 - 0.1 K/uL   Immature Granulocytes 1 %   Abs Immature Granulocytes 0.16 (H) 0.00 - 0.07 K/uL    Comment: Performed at [facility], [address] [place], [place] [address]  Basic metabolic panel     Status: Abnormal   Collection Time: 03/07/2021  1:40 AM  Result Value Ref Range   Sodium 139 135 - 145 mmol/L   Potassium 3.2 (L) 3.5 - 5.1 mmol/L   Chloride 111 98 - 111 mmol/L   CO2 20 (L) 22 - 32 mmol/L   Glucose, Bld 119 (H) 70 - 99 mg/dL    Comment: Glucose reference range applies only to samples taken after fasting for at least 8 hours.   BUN 23 8 - 23 mg/dL   Creatinine, Ser 0.99 0.61 - 1.24 mg/dL   Calcium 9.1 8.9 - 10.3 mg/dL   GFR, Estimated >60 >60 mL/min    Comment: (NOTE) Calculated using the CKD-EPI Creatinine Equation (2021)    Anion gap 8 5 - 15    Comment: Performed at [facility] [address], [place], [place] [address]  Procalcitonin     Status: None   Collection Time: 03/23/2021  1:40 AM  Result Value Ref Range   Procalcitonin 0.74  ng/mL    Comment:        Interpretation: PCT > 0.5 ng/mL and <= 2 ng/mL: Systemic infection (sepsis) is possible, but other conditions are known to elevate PCT as well. (NOTE)       Sepsis PCT Algorithm           Lower Respiratory Tract                                      Infection PCT Algorithm    ----------------------------      ----------------------------         PCT < 0.25 ng/mL                PCT < 0.10 ng/mL          Strongly encourage             Strongly discourage   discontinuation of antibiotics    initiation of antibiotics    ----------------------------     -----------------------------       PCT 0.25 - 0.50 ng/mL            PCT 0.10 - 0.25 ng/mL               OR       >80% decrease in PCT            Discourage initiation of                                            antibiotics      Encourage discontinuation           of antibiotics    ----------------------------     -----------------------------         PCT >= 0.50 ng/mL              PCT 0.26 - 0.50 ng/mL                AND       <80% decrease in PCT             Encourage initiation of                                             antibiotics       Encourage continuation           of antibiotics    ----------------------------     -----------------------------        PCT >= 0.50 ng/mL                  PCT > 0.50 ng/mL               AND         increase in PCT                  Strongly encourage                                      initiation of antibiotics    Strongly encourage escalation  of antibiotics                                     -----------------------------                                           PCT <= 0.25 ng/mL                                                 OR                                        > 80% decrease in PCT                                      Discontinue / Do not initiate                                             antibiotics  Performed at [facility] [address], [place], [place] [address]   CBC with Differential/Platelet     Status: Abnormal   Collection Time: 03/01/2021  1:40 AM  Result Value Ref Range   WBC 16.3 (H) 4.0 - 10.5 K/uL   RBC 3.65 (L) 4.22 - 5.81 MIL/uL   Hemoglobin 10.6 (L) 13.0 - 17.0 g/dL   HCT 32.4 (L) 39.0 - 52.0 %   MCV 88.8 80.0 - 100.0 fL   MCH  29.0 26.0 - 34.0 pg   MCHC 32.7 30.0 - 36.0 g/dL   RDW 14.9 11.5 - 15.5 %   Platelets 264 150 - 400 K/uL   nRBC 0.0 0.0 - 0.2 %   Neutrophils Relative % 84 %   Neutro Abs 13.8 (H) 1.7 - 7.7 K/uL   Lymphocytes Relative 8 %   Lymphs Abs 1.3 0.7 - 4.0 K/uL   Monocytes Relative 6 %   Monocytes Absolute 1.0 0.1 - 1.0 K/uL   Eosinophils Relative 1 %   Eosinophils Absolute 0.1 0.0 - 0.5 K/uL   Basophils Relative 0 %   Basophils Absolute 0.0 0.0 - 0.1 K/uL   Immature Granulocytes 1 %   Abs Immature Granulocytes 0.15 (H) 0.00 - 0.07 K/uL    Comment: Performed at [facility] [address], [place], [place] [address]   DG CHEST PORT 1 VIEW  Result Date: 03/07/2021 CLINICAL DATA:  Respiratory abnormalities. EXAM: PORTABLE CHEST 1 VIEW COMPARISON:  Radiograph 02/26/2021 FINDINGS: Lung volumes are low. Stable heart size and mediastinal contours. Patchy opacity in both lower lung zones. Mild additional patchy opacity in the right suprahilar lung. Vascular congestion. No pleural effusion. Anti lordotic positioning. IMPRESSION: 1. Low lung volumes with patchy bibasilar and right suprahilar opacities, may represent multifocal pneumonia, particularly at the right lung base. 2. Vascular congestion. Electronically Signed   By: Keith Rake M.D.   On: 03/07/2021 17:35  DG Swallowing Func-Speech Pathology  Result Date: 03/12/2021 Table formatting from the original result was not included. Objective Swallowing Evaluation: Type of Study: MBS-Modified Barium Swallow Study  Patient Details Name: David [name] MRN: [id number] Date of Birth: 08-Jun-1940 Today's Date: 03/12/2021 Time: SLP Start Time (ACUTE ONLY): 0900 -SLP Stop Time (ACUTE ONLY): 0930 SLP Time Calculation (min) (ACUTE ONLY): 30 min Past Medical History: Past Medical History: Diagnosis Date  Allergy   Arthritis   gen.  and left hip  BPH (benign prostatic hypertrophy)   Coronary artery disease   GERD (gastroesophageal reflux disease)    occasional  History of kidney stones   2007  Hyperlipidemia   Hypertension   Kidney tumor   LBP (low back pain) 06/2005  L5 radicular symptoms; MRI of LS spine severe spondylosis at L4-L5 with central cancal stenosis   Nocturia   Organic impotence   Prostate cancer ([place]) 10/2011  (Low grade) [facility] - Dr. Junious Silk  Pyelonephritis 03/05/2013  Renal oncocytoma of right kidney 01/23/2013  As 3.2 x 3.5 cm enhancing exophytic mass projecting off the lower pole. This is consistent with a renal cell carcinoma. No retroperitoneal lymphadenopathy.   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  Wears glasses  Past Surgical History: Past Surgical History: Procedure Laterality Date  COLONOSCOPY  02/15/2006, 2014  Internal hemorrhoids (Dr [name]), last in 2014  [facility] Right 1980  IR ANGIO INTRA EXTRACRAN SEL INTERNAL CAROTID UNI R MOD SED  03/05/2021  IR INTRAVSC STENT CERV CAROTID W/EMB-PROT MOD SED INCL ANGIO  03/05/2021  IR US GUIDE VASC ACCESS RIGHT  03/05/2021  LYMPHADENECTOMY Bilateral 02/15/2013  Procedure: LYMPHADENECTOMY WITH INDOCYANINE GREEN DYE INJECTION;  Surgeon: [name], MD;  Location: [facility];  Service: Urology;  Laterality: Bilateral;  PROSTATE BIOPSY N/A 12/18/2012  Procedure: BIOPSY TRANSRECTAL ULTRASONIC PROSTATE (TUBP);  Surgeon: [name], MD;  Location: [facility];  Service: Urology;  Laterality: N/A;  PROSTATE BIOPSY  12/05/11  gleason 6, 3/12 cores  RADIOLOGY WITH ANESTHESIA N/A 03/05/2021  Procedure: IR WITH ANESTHESIA;  Surgeon: Radiologist, Medication, MD;  Location: [place];  Service: Radiology;  Laterality: N/A;  REPAIR UMBILICAL AND VENTRAL HERNIA'S W/ MESH  08-21-2009  RIGHT/LEFT HEART CATH AND CORONARY ANGIOGRAPHY N/A 08/06/2019  Procedure: RIGHT/LEFT HEART CATH AND CORONARY ANGIOGRAPHY;  Surgeon: [name], Peter M, MD;  Location: [facility];  Service: Cardiovascular;  Laterality: N/A;  ROBOT ASSISTED LAPAROSCOPIC RADICAL PROSTATECTOMY N/A 02/15/2013   Procedure: ROBOTIC ASSISTED LAPAROSCOPIC RADICAL PROSTATECTOMY;  Surgeon: [name], MD;  Location: [facility];  Service: Urology;  Laterality: N/A;  ROBOTIC ASSITED PARTIAL NEPHRECTOMY Right 02/15/2013  Procedure: ROBOTIC ASSITED PARTIAL NEPHRECTOMY;  Surgeon: [name], MD;  Location: [facility];  Service: Urology;  Laterality: Right;  SPERMATOCELECTOMY Left 12/18/2012  Procedure: LEFT SPERMATOCELECTOMY;  Surgeon: [name], MD;  Location: [facility];  Service: Urology;  Laterality: Left;  TONSILLECTOMY AND ADENOIDECTOMY  as child  TOTAL KNEE ARTHROPLASTY Left 05/26/2020  Procedure: TOTAL KNEE ARTHROPLASTY;  Surgeon: Paralee Cancel, MD;  Location: [facility];  Service: Orthopedics;  Laterality: Left;  70 mins  TRANSURETHRAL RESECTION OF PROSTATE  AGE 71 HPI: Pt is a 81 y/o male presented to ED on 03/05/21 for L sided weakness. Recent flue dx. CTA showed R ICA occlusion. S/p cerebral angiogram and R carotid stenting on 1/6. MRI showed multifocal acute/early subacute cortical ischemia in anterior R MCA and small focus of acute/early subacute ischemia at L caudate head. Pt evaluated by  SLP for dysarthria, MD then ordered swallow eval due to persistent pna and complaint of coughing while eating. PMH: HTN, CAD, prostate cancer  No data recorded  Recommendations for follow up therapy are one component of a multi-disciplinary discharge planning process, led by the attending physician.  Recommendations may be updated based on patient status, additional functional criteria and insurance authorization. Assessment / Plan / Recommendation Clinical Impressions 03/12/2021 Clinical Impression  Pt demonstrates a moderate to severe pharyngeal dysphagia, likely acute on chronic in nature. Pt demonstrates structural changes in pharynx impacting function that are likely related to curvature of spinal column. Pt has initiation of swallow at pyriform sinuses, leading to instances of sensed aspiration of thin  liquids before the swallow without expectoration (PAS 7). Pt additionally has moderate residue with liquid and solid boluses due to restricted bolus flow (decreased epiglottic deflection and UES opening) appearing secondary to curvature of cervical spine. SLP able to cue pt to complete a chin tuck which reduced pyriform residue and also complete preventative throat clears and second swallow to clear nectar thick penetrate. Pt fatigued significantly during exam and generalized weakness is increasing pts risk at this time. Question if a dys 3 (mech soft diet) and nectar thick liqudis will be tolerated despite efforts. Will attempt modified diet and strengthing interventions. Pt recommended to f/u with AIR. SLP Visit Diagnosis Dysphagia, pharyngeal phase (R13.13) Attention and concentration deficit following -- Frontal lobe and executive function deficit following -- Impact on safety and function Moderate aspiration risk;Risk for inadequate nutrition/hydration   Treatment Recommendations 03/14/2021 Treatment Recommendations Therapy as outlined in treatment plan below   Prognosis 03/14/2021 Prognosis for Safe Diet Advancement Fair Barriers to Reach Goals -- Barriers/Prognosis Comment -- Diet Recommendations 03/19/2021 SLP Diet Recommendations Dysphagia 3 (Mech soft) solids;Nectar thick liquid Liquid Administration via Cup;Straw Medication Administration Crushed with puree Compensations Slow rate;Small sips/bites;Follow solids with liquid;Clear throat intermittently;Effortful swallow;Chin tuck;Multiple dry swallows after each bite/sip Postural Changes Remain semi-upright after after feeds/meals (Comment);Seated upright at 90 degrees   Other Recommendations 03/08/2021 Recommended Consults -- Oral Care Recommendations Oral care BID Other Recommendations Have oral suction available Follow Up Recommendations Acute inpatient rehab (3hours/day) Assistance recommended at discharge -- Functional Status Assessment Patient has had a  recent decline in their functional status and demonstrates the ability to make significant improvements in function in a reasonable and predictable amount of time. Frequency and Duration  03/13/2021 Speech Therapy Frequency (ACUTE ONLY) min 2x/week Treatment Duration 2 weeks   Oral Phase 03/01/2021 Oral Phase Impaired Oral - Pudding Teaspoon -- Oral - Pudding Cup -- Oral - Honey Teaspoon -- Oral - Honey Cup -- Oral - Nectar Teaspoon -- Oral - Nectar Cup Lingual/palatal residue Oral - Nectar Straw Lingual/palatal residue Oral - Thin Teaspoon Lingual/palatal residue Oral - Thin Cup Lingual/palatal residue Oral - Thin Straw Lingual/palatal residue Oral - Puree Lingual/palatal residue Oral - Mech Soft Lingual/palatal residue Oral - Regular -- Oral - Multi-Consistency -- Oral - Pill -- Oral Phase - Comment --  Pharyngeal Phase 03/21/2021 Pharyngeal Phase Impaired Pharyngeal- Pudding Teaspoon -- Pharyngeal -- Pharyngeal- Pudding Cup -- Pharyngeal -- Pharyngeal- Honey Teaspoon -- Pharyngeal -- Pharyngeal- Honey Cup -- Pharyngeal -- Pharyngeal- Nectar Teaspoon -- Pharyngeal -- Pharyngeal- Nectar Cup Delayed swallow initiation-pyriform sinuses;Reduced epiglottic inversion;Penetration/Apiration after swallow;Pharyngeal residue - valleculae;Pharyngeal residue - pyriform;Compensatory strategies attempted (with notebox) Pharyngeal Material enters airway, CONTACTS cords and not ejected out;Material enters airway, remains ABOVE vocal cords and not ejected out;Material does not enter airway Pharyngeal- Nectar Straw  Delayed swallow initiation-pyriform sinuses;Reduced epiglottic inversion;Penetration/Apiration after swallow;Pharyngeal residue - valleculae;Pharyngeal residue - pyriform;Compensatory strategies attempted (with notebox) Pharyngeal Material enters airway, CONTACTS cords and not ejected out;Material enters airway, remains ABOVE vocal cords and not ejected out;Material does not enter airway Pharyngeal- Thin Teaspoon Delayed  swallow initiation-pyriform sinuses;Reduced epiglottic inversion;Penetration/Aspiration before swallow;Trace aspiration Pharyngeal Material enters airway, passes BELOW cords and not ejected out despite cough attempt by patient Pharyngeal- Thin Cup Delayed swallow initiation-pyriform sinuses;Reduced epiglottic inversion;Penetration/Apiration after swallow;Pharyngeal residue - valleculae;Pharyngeal residue - pyriform;Compensatory strategies attempted (with notebox) Pharyngeal Material enters airway, CONTACTS cords and not ejected out;Material enters airway, remains ABOVE vocal cords and not ejected out;Material does not enter airway Pharyngeal- Thin Straw Delayed swallow initiation-pyriform sinuses;Reduced epiglottic inversion;Penetration/Apiration after swallow;Pharyngeal residue - valleculae;Pharyngeal residue - pyriform;Compensatory strategies attempted (with notebox) Pharyngeal Material enters airway, CONTACTS cords and not ejected out;Material enters airway, remains ABOVE vocal cords and not ejected out;Material does not enter airway Pharyngeal- Puree Delayed swallow initiation-pyriform sinuses;Reduced epiglottic inversion;Pharyngeal residue - valleculae;Pharyngeal residue - pyriform;Compensatory strategies attempted (with notebox) Pharyngeal -- Pharyngeal- Mechanical Soft Delayed swallow initiation-pyriform sinuses;Reduced epiglottic inversion;Pharyngeal residue - valleculae;Pharyngeal residue - pyriform;Compensatory strategies attempted (with notebox) Pharyngeal -- Pharyngeal- Regular -- Pharyngeal -- Pharyngeal- Multi-consistency -- Pharyngeal -- Pharyngeal- Pill -- Pharyngeal -- Pharyngeal Comment --  Cervical Esophageal Phase  03/11/2021 Cervical Esophageal Phase Impaired Pudding Teaspoon -- Pudding Cup -- Honey Teaspoon -- Honey Cup -- Nectar Teaspoon -- Nectar Cup Reduced cricopharyngeal relaxation;Esophageal backflow into cervical esophagus;Esophageal backflow into the pharynx Nectar Straw Reduced  cricopharyngeal relaxation;Esophageal backflow into cervical esophagus;Esophageal backflow into the pharynx Thin Teaspoon -- Thin Cup -- Thin Straw -- Puree -- Mechanical Soft -- Regular -- Multi-consistency -- Pill -- Cervical Esophageal Comment see impression DeBlois, Katherene Ponto 03/30/2021, 10:07 AM                     US THYROID  Result Date: 03/08/2021 CLINICAL DATA:  Incidental on CT. EXAM: THYROID ULTRASOUND TECHNIQUE: Ultrasound examination of the thyroid gland and adjacent soft tissues was performed. COMPARISON:  03/05/2021, 02/27/2020 FINDINGS: Parenchymal Echotexture: Mildly heterogenous Isthmus: 0.6 cm, previously 1.3 cm Right lobe: 4.2 x 1.4 x 1.7 cm, previously 4.5 x 2.3 x 1.8 cm Left lobe: 3.9 x 1.8 x 1.6 cm, previously 4.8 x 1.8 x 2.1 cm _________________________________________________________ Estimated total number of nodules >/= 1 cm: 1 Number of spongiform nodules >/=  2 cm not described below (TR1): 0 Number of mixed cystic and solid nodules >/= 1.5 cm not described below ([place]): 0 _________________________________________________________ Nodule # 1: Prior biopsy: No Location: Right; Superior Maximum size: 0.7 cm; Other 2 dimensions: 0.6 x 0.5 cm, previously, 0.6 x 0.6 x 0.5 cm Composition: cannot determine (2) Echogenicity: hypoechoic (2) Shape: not taller-than-wide (0) Margins: ill-defined (0) Echogenic foci: peripheral calcifications (2) ACR TI-RADS total points: 6. ACR TI-RADS risk category:  TR4 (4-6 points). Significant change in size (>/= 20% in two dimensions and minimal increase of 2 mm): No Change in features: No Change in ACR TI-RADS risk category: No ACR TI-RADS recommendations: Given size (<0.9 cm) and appearance, this nodule does NOT meet TI-RADS criteria for biopsy or dedicated follow-up. _________________________________________________________ Nodule # 2: Prior biopsy: No Location: Right; Inferior Maximum size: 1.1 cm; Other 2 dimensions: 1.0 x 0.9 cm, previously, 1.1 x 1.0  x 0.9 cm Composition: solid/almost completely solid (2) Echogenicity: hypoechoic (2) Shape: not taller-than-wide (0) Margins: smooth (0) Echogenic foci: none (0) ACR TI-RADS total points: 4. ACR TI-RADS risk category:  TR4 (4-6 points). Significant change in size (>/= 20% in two dimensions and minimal increase of 2 mm): No Change in features: No Change in ACR TI-RADS risk category: No ACR TI-RADS recommendations: *Given size (>/= 1 - 1.4 cm) and appearance, a follow-up ultrasound in 1 year should be considered based on TI-RADS criteria. _________________________________________________________ No cervical lymphadenopathy identified. IMPRESSION: 1. The right paratracheal nodule described on recent CT head neck is not visualized sonographically. In retrospect, this CT finding is similar to 06/13/2013 comparison chest CT and appears extrathyroidal. 2. Unchanged appearance of right inferior solid thyroid nodule (labeled 2, 1.1 cm) which again meets criteria (TI-RADS category 4) for 1 year ultrasound surveillance. The above is in keeping with the ACR TI-RADS recommendations - J Am Coll Radiol 2017;14:587-595. Ruthann Cancer, MD [facility] Electronically Signed   By: Ruthann Cancer M.D.   On: 03/08/2021 08:19       Medical Problem List and Plan: 1. Functional deficits secondary to multifocal acute/early subacute cortical ischemia in the anterior right MCA territory, likely large vessel source from critical right ICA stenosis.  Status post right ICA stenting.  Left caudate small infarct>> procedure related versus occult atrial fibrillation.  -patient may shower  -ELOS/Goals: 10-15 days, supervision to min assist with PT, OT, and SLP 2.  Antithrombotics: -DVT/anticoagulation:  Pharmaceutical: Lovenox  -antiplatelet therapy: Aspirin, Brilinta 3. Pain Management: Tylenol 4. Mood: LCSW to evaluate and provide emotional support  -antipsychotic agents:  N/A 5. Neuropsych: This patient is capable of making decisions on his own behalf. 6. Skin/Wound Care: Routine skin care checks 7. Fluids/Electrolytes/Nutrition: Routine ins and outs and follow up chemistries 8.  Persistent cough with imaging evidence of patchy bibasilar and right suprahilar opacities  -- On Unasyn for suspected multifocal pneumonia; possible aspiration  --Continue pulmonary toilet, mucolytics, albuterol nebs prn  -persistent leukocytosis--f/u labs  -currently afebrile 9.  Frequent PVCs: Sees Dr. Stanford Breed as an outpatient  --recommends loop recorder>> placed 1/10 10: Hypertension: Start Benicar 40 mg daily  11: Hyperlipidemia: Zetia and Lipitor 12: EtOH use: advised to drink no more than 1 alcoholic beverage dialy 13: Overweight: BMI = 28.5. Rec: weight loss, diet and exercise as appropriate 14: CKD stage 3a: serum Cr = 0.99. Monitor 16: Hypokalemia: 1/10>>oral repletion, follow-up BMP in am 17: Anemia: asymptomatic --follow-up CBC and monitor 18: Thyroid nodule: follow-up ultrasound in one year recommended 19. Dysphagia: continue D3 with nectars, aspiration precautions  -advance per SLP     Barbie Banner, PA-C 03/06/2021

## 2021-03-09 NOTE — Progress Notes (Signed)
Modified Barium Swallow Progress Note  Patient Details  Name: David [name] MRN: [id number] Date of Birth: [birth year]  Today's Date: 03/12/2021  Modified Barium Swallow completed.  Full report located under Chart Review in the Imaging Section.  Brief recommendations include the following:  Clinical Impression  Pt demonstrates a moderate to severe pharyngeal dysphagia, likely acute on chronic in nature. Pt demonstrates structural changes in pharynx impacting function that are likely related to curvature of spinal column. Pt has initiation of swallow at pyriform sinuses, leading to instances of sensed aspiration of thin liquids before the swallow without expectoration (PAS 7). Pt additionally has moderate residue with liquid and solid boluses due to restricted bolus flow (decreased epiglottic deflection and UES opening) appearing secondary to curvature of cervical spine. SLP able to cue pt to complete a chin tuck which reduced pyriform residue and also complete preventative throat clears and second swallow to clear nectar thick penetrate. Pt fatigued significantly during exam and generalized weakness is increasing pts risk at this time. Question if a dys 3 (mech soft diet) and nectar thick liqudis will be tolerated despite efforts. Will attempt modified diet and strengthing interventions. Pt recommended to f/u with AIR.   Swallow Evaluation Recommendations       SLP Diet Recommendations: Dysphagia 3 (Mech soft) solids;Nectar thick liquid   Liquid Administration via: Cup;Straw   Medication Administration: Crushed with puree   Supervision: Staff to assist with self feeding;Intermittent supervision to cue for compensatory strategies   Compensations: Slow rate;Small sips/bites;Follow solids with liquid;Clear throat intermittently;Effortful swallow;Chin tuck;Multiple dry swallows after each bite/sip   Postural Changes: Remain semi-upright after after feeds/meals (Comment);Seated upright at 90  degrees   Oral Care Recommendations: Oral care BID   Other Recommendations: Have oral suction available  Herbie Baltimore, MA [place] Office [phone]   Lynann Beaver 03/24/2021,10:03 AM

## 2021-03-09 NOTE — Progress Notes (Signed)
Inpatient Rehabilitation  Patient information reviewed and entered into eRehab system by Mina Carlisi M. Solveig Fangman, M.A., CCC/SLP, PPS Coordinator.  Information including medical coding, functional ability and quality indicators will be reviewed and updated through discharge.    

## 2021-03-09 NOTE — TOC Transition Note (Signed)
Transition of Care [facility]) - CM/SW Discharge Note   Patient Details  Name: David [name] MRN: [id number] Date of Birth: 1940-07-02  [facility] CM/SW Contact:  Pollie Friar, RN Phone Number: 03/10/2021, 10:31 AM   Clinical Narrative:    Patient is discharging to CIR today. CM signing off.    Final next level of care: IP Rehab Facility Barriers to Discharge: No Barriers Identified   Patient Goals and CMS Choice     Choice offered to / list presented to : Patient  Discharge Placement                       Discharge Plan and Services                                     Social Determinants of Health (SDOH) Interventions     Readmission Risk Interventions No flowsheet data found.

## 2021-03-09 NOTE — Progress Notes (Signed)
Physical Therapy Treatment Patient Details Name: David [name] MRN: [id number] DOB: 01-Dec-1940 Today's Date: 03/15/2021   History of Present Illness 81 y/o male presented to ED on 03/05/21 for L sided weakness. Recent flue dx. CTA showed R ICA occlusion. S/p cerebral angiogram and R carotid stenting on 1/6. MRI showed multifocal acute/early subacute cortical ischemia in anterior R MCA and small focus of acute/early subacute ischemia at L caudate head. PMH: HTN, CAD, prostate cancer    PT Comments    Patient is to be discharged to CIR today. He continues the cough throughout the session and needs reminders to initiate pursed lip breathing and inhale in through his nose while the nasal canula is on his face. He was able to demonstrate improve endurance with activity but was still unable to ambulate outside of his room. During the weight shifting exercise he was able to lift the contralateral foot in a controled manor. Patient continues to need skilled physical therapy in order to address balance, endurance, and strength deficits.   Recommendations for follow up therapy are one component of a multi-disciplinary discharge planning process, led by the attending physician.  Recommendations may be updated based on patient status, additional functional criteria and insurance authorization.  Follow Up Recommendations  Acute inpatient rehab (3hours/day)     Assistance Recommended at Discharge Frequent or constant Supervision/Assistance  Patient can return home with the following A little help with walking and/or transfers;A lot of help with walking and/or transfers;A lot of help with bathing/dressing/bathroom;Assistance with cooking/housework;Assistance with feeding;Assist for transportation;Help with stairs or ramp for entrance   Equipment Recommendations  Rolling walker (2 wheels)    Recommendations for Other Services       Precautions / Restrictions Precautions Precautions: Fall Precaution  Comments: L hemiparesis Restrictions Weight Bearing Restrictions: No     Mobility  Bed Mobility Overal bed mobility: Needs Assistance Bed Mobility: Supine to Sit     Supine to sit: Min assist     General bed mobility comments: Uses Left rail for bed mobility and supine to sit transfer. Requires direction with his legs. Single step cuing to get into seated position for standing.  Transfers Overall transfer level: Needs assistance Equipment used: Rolling walker (2 wheels) Transfers: Sit to/from Stand Sit to Stand: Min assist;From elevated surface     Step pivot transfers: Min assist;+2 physical assistance     General transfer comment: Patient required a lot of effort to stand up off of the bed and was visiblly fatuiged.    Ambulation/Gait Ambulation/Gait assistance: Min guard Gait Distance (Feet): 30 Feet (Patient ambulated around the room with one seated rest break.) Assistive device: Rolling walker (2 wheels) Gait Pattern/deviations: Step-through pattern;Decreased stride length;Decreased dorsiflexion - left;Decreased weight shift to left   Gait velocity interpretation: <1.31 ft/sec, indicative of household ambulator   General Gait Details: Patient takes a long time to change direction/turn around.   Stairs             Wheelchair Mobility    Modified Rankin (Stroke Patients Only) Modified Rankin (Stroke Patients Only) Pre-Morbid Rankin Score: No symptoms Modified Rankin: Moderately severe disability     Balance Overall balance assessment: Needs assistance Sitting-balance support: Feet supported Sitting balance-Leahy Scale: Good     Standing balance support: Bilateral upper extremity supported;Reliant on assistive device for balance Standing balance-Leahy Scale: Poor Standing balance comment: Patient was able to shift his weight side to side with a small lift off on the contralateral side with upper extremities support  on the walker.                             Cognition Arousal/Alertness: Awake/alert Behavior During Therapy: Flat affect Overall Cognitive Status: Impaired/Different from baseline Area of Impairment: Attention;Following commands                   Current Attention Level: Selective   Following Commands: Follows one step commands consistently;Follows one step commands with increased time;Follows multi-step commands with increased time;Follows multi-step commands inconsistently Safety/Judgement: Decreased awareness of safety Awareness: Emergent Problem Solving: Slow processing;Requires verbal cues          Exercises      General Comments General comments (skin integrity, edema, etc.): Patient was coughing throughout the session.      Pertinent Vitals/Pain Pain Assessment: No/denies pain    Home Living                          Prior Function            PT Goals (current goals can now be found in the care plan section) Acute Rehab PT Goals Potential to Achieve Goals: Fair Progress towards PT goals: Progressing toward goals    Frequency    Min 3X/week      PT Plan Current plan remains appropriate    Co-evaluation PT/OT/SLP Co-Evaluation/Treatment: Yes Reason for Co-Treatment: Complexity of the patient's impairments (multi-system involvement);For patient/therapist safety;To address functional/ADL transfers PT goals addressed during session: Mobility/safety with mobility;Balance;Strengthening/ROM        AM-PAC PT "6 Clicks" Mobility   Outcome Measure  Help needed turning from your back to your side while in a flat bed without using bedrails?: A Little Help needed moving from lying on your back to sitting on the side of a flat bed without using bedrails?: A Little Help needed moving to and from a bed to a chair (including a wheelchair)?: A Little Help needed standing up from a chair using your arms (e.g., wheelchair or bedside chair)?: A Little Help needed to walk in  hospital room?: A Lot Help needed climbing 3-5 steps with a railing? : Total 6 Click Score: 15    End of Session Equipment Utilized During Treatment: Gait belt Activity Tolerance: Patient limited by fatigue Patient left: in chair;with chair alarm set;with call bell/phone within reach Nurse Communication: Mobility status PT Visit Diagnosis: Other abnormalities of gait and mobility (R26.89);Hemiplegia and hemiparesis;Other symptoms and signs involving the nervous system (R29.898);Muscle weakness (generalized) (M62.81) Hemiplegia - Right/Left: Left Hemiplegia - dominant/non-dominant: Non-dominant Hemiplegia - caused by: Cerebral infarction     Time: 1341-1430 PT Time Calculation (min) (ACUTE ONLY): 49 min  Charges:  $Gait Training: 8-22 mins $Therapeutic Activity: 8-22 mins $Neuromuscular Re-education: 8-22 mins                     Quenton Fetter, SPT   Quenton Fetter 03/21/2021, 4:30 PM

## 2021-03-09 NOTE — Progress Notes (Signed)
PROGRESS NOTE  David [name]  SEG:[id number] DOB: 30-Aug-1940 DOA: 03/05/2021 PCP: David Anger, MD  Brief Narrative: [name] is a 81 yo male with PMH CAD, HTN, HLD, GERD, remote prostate cancer, renal oncocytoma R kidney 2014, influenza diagnosed 02/26/2021 s/p tamiflu, also s/p antibiotics and steroids for persistent cough who presented originally on 03/05/21 as a code stroke with left side weakness.  Work-up revealed multifocal acute/early subacute right MCA CVA and severe stenosis involving right carotid bulb. Neurology admitted the patient and he underwent stenting by neuroIR, started on aspirin and brilinta. Due to worsening of his subacute cough since admission, rising white count, medicine consulted. Unasyn started for aspiration pneumonia, SLP evaluated, confirming dysphagia. Patient's leukocytosis has improved. He has remained afebrile and is not hypoxemic. He is stable medically for discharge to continue care at [facility]. Recommend that he continue working with SLP, follow aspiration precautions, continue antibiotics for a total of 7 days.   Assessment & Plan: Aspiration pneumonia: Leukocytosis despite no longer being on steroids, persistent cough worse with po intake. Onset began prior to stroke and worsened after. PCT also elevated. Recent influenza may have worsened symptoms, though aspiration is primary concern. MRSA negative, full RVP negative, S. pneumo UAg negative.  - Continue unasyn > once discharged can complete a total of 7 days of antibiotics with oral augmentin 872-125mg  po BID.  - SLP consulted: Formal imaging swallow study 1/10 confirms dysphagia > continue aspiration precautions and SLP at CIR.  - Continue antitussives as ordered. D/w family that hydrocodone-containing products would not necessarily precipitate the same intolerance that codeine has in the past, though with symptoms improving we will not trial this yet.  - Continue PPI, consider EGD (never had this) to eval  for esophagitis.  - Reportedly on olmesartan PTA, not currently. Not likely to be causing cough with alternative explanations, but could continue trial of alternative agent.  - Hx mild obstructive lung disease by PFTs 2021 (FEV1/FVC 71%). No wheezing, but will make prn albuterol available.  - No hypoxia documented. Would recommend supplemental oxygen only prn SpO2 <90% or increased WOB. - Incentive spirometry needed w/low volumes on CXR. - Suspect vascular congestion on CXR relates to hypoventilation. Not overloaded clinically, echo w/preserved biventricular function. Will not continue lasix for now.  Right MCA infarcts due to severe right carotid stenosis s/p emergent RICA stenting:  - Continue DAPT per neurology, neuro-IR. Continue lipitor, zetia. Having PVCs, primary d/w cardiology. Will pursue loop recorder at discharge. CIR is planned disposition and has bed available/insurance authorized for DC today.   Right thyroid nodule: U/S confirms need for 1 year repeat U/S. Suspected inferiorly extending abnormality on CTA neck not noted on U/S, suspect extra-thyroidal.   Hypokalemia:  - Supplement ordered  Subjective: Feels better and appears, per family, perkier than he has in some time. No chest pain or dyspnea reported. Still coughing, though much less severe. Tmax 98.101F, WBC declining as anticipated.    Objective: Vitals:   03/12/2021 0233 03/12/2021 0424 03/08/2021 0700 03/27/2021 0800  BP: 133/68 (!) 111/100 (!) 148/68 (!) 148/68  Pulse: 75 64 66 66  Resp: (!) 29 (!) 24 (!) 21 (!) 21  Temp: 98.8 F (37.1 C) 98.1 F (36.7 C) 98.2 F (36.8 C) 98.2 F (36.8 C)  TempSrc: Axillary Oral Axillary Axillary  SpO2: 95% 95% 94% 96%  Weight:      Height:       Gen: 81 y.o. male in no distress Pulm: Nonlabored breathing, no  wheezes or crackles CV: Regular rate and rhythm. No murmur, rub, or gallop. No JVD, no dependent edema. GI: Abdomen soft, non-tender, non-distended, with normoactive bowel  sounds.  Ext: Warm, no deformities Skin: No rashes, lesions or ulcers on visualized skin. Neuro: Alert and oriented. Left ataxia. No new focal neurological deficits. Psych: Judgement and insight appear fair. Mood euthymic & affect congruent. Behavior is appropriate.    David Pour, MD [facility] [email or web address] 03/29/2021, 11:15 AM

## 2021-03-09 NOTE — Progress Notes (Signed)
Inpatient Rehabilitation Admission Medication Review by a Pharmacist  A complete drug regimen review was completed for this patient to identify any potential clinically significant medication issues.  High Risk Drug Classes Is patient taking? Indication by Medication  Antipsychotic Yes Prochlorperazine PRN for N/V  Anticoagulant Yes Enoxaparin for VTE prophylaxis  Antibiotic Yes IV Ampicillin/Sulbactam for pneumonia  Opioid No   Antiplatelet Yes ASA, Ticagrelor for subacute cortical ischemia (S/P R ICA stent)  Hypoglycemics/insulin No   Vasoactive Medication No   Chemotherapy No   Other No      Type of Medication Issue Identified Description of Issue Recommendation(s)  Drug Interaction(s) (clinically significant)     Duplicate Therapy     Allergy     No Medication Administration End Date     Incorrect Dose     Additional Drug Therapy Needed  Both [place] discharge summary and CIR admission note indicated that pt was to resume olmesartan, but this med was not resumed yet on CIR CIR team to review on AM rounds  Significant med changes from prior encounter (inform family/care partners about these prior to discharge).    Other  The following meds were listed on [facility] discharge summary, but were not ordered on admission to CIR (these were home meds that pt did not receive at [facility]): cetirizine, fluticasone, vitamin C3 CIR team to evaluate whether pt needs these meds at this time    Clinically significant medication issues were identified that warrant physician communication and completion of prescribed/recommended actions by midnight of the next day:  No  Name of provider notified for urgent issues identified:  N/A  Time spent performing this drug regimen review (minutes):  25  Gillermina Hu, PharmD, BCPS, [facility] Clinical Pharmacist 03/01/2021 5:44 PM

## 2021-03-09 NOTE — Consult Note (Addendum)
ELECTROPHYSIOLOGY CONSULT NOTE  Patient ID: David [name] MRN: [id number], DOB/AGE: 05/06/1940   Admit date: 03/05/2021 Date of Consult: 03/03/2021  Primary Physician: Cassandria Anger, MD Primary Cardiologist: [name], MD  Primary Electrophysiologist: New to Dr. Curt Bears Reason for Consultation: Cryptogenic stroke; recommendations regarding Implantable Loop Recorder Insurance: [facility] Medicare  History of Present Illness EP has been asked to evaluate David [name] for placement of an implantable loop recorder to monitor for atrial fibrillation by Dr Erlinda Hong.  The patient was admitted on 03/05/2021 with left sided weakness, dysarthia, and visual neglect.    Imaging demonstrated Multifocal acute/early subacute cortical ischemia in the anterior right MCA territory, likely large vessel source from critical R ICA stenosis s/p right ICA stenting. Left caudate small infarct could be procedure related vs. Occult afib.    He has undergone workup for stroke:  CT Head - No acute intracranial pathology. ASPECTS is 10.  CTA Neck - Prominent irregular hypodensity within the proximal right ICA. A portion of this hypodensity likely reflects soft plaque. However, the irregular portion is highly suspicious for superimposed thrombus (or unstable plaque). Resultant severe stenosis of the proximal right ICA estimated to be 80%.  CTA Head - Moderate/severe stenosis within a superior division mid M2 right MCA vessel. Moderate stenosis within the A4 left anterior cerebral artery. Moderate stenosis of the paraclinoid right ICA. IR with right ICA critical stenosis s/p CAS MRI head - Multifocal acute/early subacute cortical ischemia in the anterior right MCA territory. No hemorrhage or mass effect. Small focus of acute/early subacute ischemia at the left caudate head. 2D Echo -EF 65 to 70% Discussed with Dr. Stanford Breed, recommend Loop recorder to rule out afib David [name] - negative 02/26/21 LDL -  106 HgbA1c - 6.3 VTE prophylaxis - Lovenox 40 mg daily  aspirin 81 mg daily prior to admission, now on aspirin 81 mg daily and Brilinta (ticagrelor) 90 mg bid post stent Patient David [name] be counseled to be compliant with his antithrombotic medications Ongoing aggressive stroke risk factor management Therapy recommendations:  CIR Disposition:  CIR today   The patient has been monitored on telemetry which has demonstrated sinus rhythm with no arrhythmias.  Inpatient stroke work-up Kathlyn Leachman not require a TEE per Neurology.   Echocardiogram as above. Lab work is reviewed.  Prior to admission, the patient denies chest pain, shortness of breath, dizziness, palpitations, or syncope.  He is recovering from his stroke with plans to attend CIR  at discharge.  Wife states that when EMS picked him up 12/30 when he was sick with the flu, he was told his HR was irregular and asked if he had AF. Looking back, was most likely NSR with frequent PVCs.  Past Medical History:  Diagnosis Date   Allergy    Arthritis    gen.  and left hip   BPH (benign prostatic hypertrophy)    Coronary artery disease    GERD (gastroesophageal reflux disease)    occasional   History of kidney stones    2007   Hyperlipidemia    Hypertension    Kidney tumor    LBP (low back pain) 06/2005   L5 radicular symptoms; MRI of LS spine severe spondylosis at L4-L5 with central cancal stenosis    Nocturia    Organic impotence    Prostate cancer ([place]) 10/2011   (Low grade) [facility] - Dr. Junious Silk   Pyelonephritis 03/05/2013   Renal oncocytoma of right kidney 01/23/2013   As 3.[name]  x 3.5 cm enhancing exophytic mass projecting off the lower pole. This is consistent with a renal cell carcinoma. No retroperitoneal lymphadenopathy.    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   Wears glasses      Surgical History:  Past Surgical History:  Procedure Laterality Date   COLONOSCOPY  02/15/2006, 2014   Internal hemorrhoids (Dr [name]), last  in 2014   [place] Right 1980   IR ANGIO INTRA EXTRACRAN SEL INTERNAL CAROTID UNI R MOD SED  03/05/2021   IR INTRAVSC STENT CERV CAROTID W/EMB-PROT MOD SED INCL ANGIO  03/05/2021   IR US GUIDE VASC ACCESS RIGHT  03/05/2021   LYMPHADENECTOMY Bilateral 02/15/2013   Procedure: LYMPHADENECTOMY WITH INDOCYANINE GREEN DYE INJECTION;  Surgeon: [name], MD;  Location: [facility];  Service: Urology;  Laterality: Bilateral;   PROSTATE BIOPSY N/A 12/18/2012   Procedure: BIOPSY TRANSRECTAL ULTRASONIC PROSTATE (TUBP);  Surgeon: [name], MD;  Location: [facility];  Service: Urology;  Laterality: N/A;   PROSTATE BIOPSY  12/05/11   gleason 6, 3/12 cores   RADIOLOGY WITH ANESTHESIA N/A 03/05/2021   Procedure: IR WITH ANESTHESIA;  Surgeon: Radiologist, Medication, MD;  Location: [place];  Service: Radiology;  Laterality: N/A;   REPAIR UMBILICAL AND VENTRAL HERNIA'S W/ MESH  08-21-2009   RIGHT/LEFT HEART CATH AND CORONARY ANGIOGRAPHY N/A 08/06/2019   Procedure: RIGHT/LEFT HEART CATH AND CORONARY ANGIOGRAPHY;  Surgeon: [name], Peter M, MD;  Location: [facility];  Service: Cardiovascular;  Laterality: N/A;   ROBOT ASSISTED LAPAROSCOPIC RADICAL PROSTATECTOMY N/A 02/15/2013   Procedure: ROBOTIC ASSISTED LAPAROSCOPIC RADICAL PROSTATECTOMY;  Surgeon: [name], MD;  Location: [facility];  Service: Urology;  Laterality: N/A;   ROBOTIC ASSITED PARTIAL NEPHRECTOMY Right 02/15/2013   Procedure: ROBOTIC ASSITED PARTIAL NEPHRECTOMY;  Surgeon: [name], MD;  Location: [facility];  Service: Urology;  Laterality: Right;   SPERMATOCELECTOMY Left 12/18/2012   Procedure: LEFT SPERMATOCELECTOMY;  Surgeon: [name], MD;  Location: [facility];  Service: Urology;  Laterality: Left;   TONSILLECTOMY AND ADENOIDECTOMY  as child   TOTAL KNEE ARTHROPLASTY Left 05/26/2020   Procedure: TOTAL KNEE ARTHROPLASTY;  Surgeon: Paralee Cancel, MD;  Location: [facility];   Service: Orthopedics;  Laterality: Left;  70 mins   TRANSURETHRAL RESECTION OF PROSTATE  AGE 17     Medications Prior to Admission  Medication Sig Dispense Refill Last Dose   acetaminophen (TYLENOL) 500 MG tablet Take 500-1,000 mg by mouth every 6 (six) hours as needed for moderate pain.   03/05/2021   albuterol (VENTOLIN HFA) 108 (90 Base) MCG/ACT inhaler Inhale [name] puffs into the lungs every 6 (six) hours as needed for wheezing or shortness of breath (Cough). 18 g 1 03/04/2021   aspirin EC 81 MG tablet Take 1 tablet (81 mg total) by mouth daily. 100 tablet 3 03/04/2021   atorvastatin (LIPITOR) 80 MG tablet TAKE 1 TABLET(80 MG) BY MOUTH DAILY AT 6 PM 90 tablet 3 03/04/2021   cetirizine (ZYRTEC) 10 MG tablet Take 1 tablet (10 mg total) by mouth daily. 90 tablet [name] 03/04/2021   DULoxetine (CYMBALTA) 30 MG capsule Take 30 mg by mouth daily.   03/04/2021   ezetimibe (ZETIA) 10 MG tablet Take 10 mg by mouth daily.   03/04/2021   fluticasone (FLONASE) 50 MCG/ACT nasal spray Place [name] sprays into both nostrils daily. 18 mL [name] Past Week   olmesartan (BENICAR) 40 MG tablet Take 1 tablet (40 mg total) by mouth  daily. 90 tablet 3 03/04/2021   pantoprazole (PROTONIX) 40 MG tablet TAKE 1 TABLET BY MOUTH TWICE DAILY 60 tablet 11 03/05/2021   Vitamin D, Cholecalciferol, 50 MCG (2000 UT) CAPS Take [name],000 Units by mouth daily.    03/04/2021   benzonatate (TESSALON) 200 MG capsule Take 1 capsule (200 mg total) by mouth 3 (three) times daily as needed for cough. (Patient not taking: Reported on 03/05/2021) 60 capsule 1 Not Taking   chlorpheniramine-HYDROcodone (TUSSIONEX PENNKINETIC ER) 10-8 MG/5ML SUER Take 5 ml po bid x 1 week, then [name].5 ml bid x 1 week, then taper off and d/c (Patient not taking: Reported on 03/05/2021) 230 mL 0 Not Taking   ezetimibe (ZETIA) 10 MG tablet Take 1 tablet (10 mg total) by mouth daily. 90 tablet 3    ipratropium (ATROVENT) 0.06 % nasal spray Place [name] sprays into both nostrils 4 (four) times daily. As needed for  nasal congestion, runny nose (Patient not taking: Reported on 03/05/2021) 15 mL 0 Completed Course   oseltamivir (TAMIFLU) 75 MG capsule Take 1 capsule (75 mg total) by mouth every 12 (twelve) hours. (Patient not taking: Reported on 03/05/2021) 10 capsule 0 Completed Course    Inpatient Medications:   aspirin EC  81 mg Oral Daily   atorvastatin  80 mg Oral Daily   benzonatate  100 mg Oral TID   Chlorhexidine Gluconate Cloth  6 each Topical Q0600   dextromethorphan  30 mg Oral TID   DULoxetine  30 mg Oral Daily   enoxaparin (LOVENOX) injection  40 mg Subcutaneous Q24H   ezetimibe  10 mg Oral Daily   guaiFENesin  600 mg Oral BID   mouth rinse  15 mL Mouth Rinse BID   pantoprazole  40 mg Oral BID   sodium chloride flush  3 mL Intravenous Once   ticagrelor  90 mg Oral BID    Allergies:  Allergies  Allergen Reactions   Codeine Itching    Severe hiccups   Ace Inhibitors Other (See Comments)    REACTION: Cough   Amlodipine     LE swelling   Hydrocodone Hives and Itching    Hiccoughs   Percocet [Oxycodone-Acetaminophen]     Severe hiccups    Social History   Socioeconomic History   Marital status: Married    Spouse name: Not on file   Number of children: [name]   Years of education: Not on file   Highest education level: Not on file  Occupational History   Not on file  Tobacco Use   Smoking status: Former    Packs/day: 0.25    Years: 10.00    Pack years: [name].50    Types: Cigarettes    Quit date: 02/28/1981    Years since quitting: 40.0   Smokeless tobacco: Never  Vaping Use   Vaping Use: Never used  Substance and Sexual Activity   Alcohol use: Yes    Alcohol/week: 1.0 standard drink    Types: 1 Glasses of wine per week    Comment: occas.   Drug use: Not Currently    Types: Flunitrazepam   Sexual activity: Not on file  Other Topics Concern   Not on file  Social History Narrative   Married- 20 yrs   [name] daughters   5 grandchildren   Never Smoked   Alcohol use-yes  (occasional/social)   Retired- Equities trader roster:      Urologist - Dr. [name]  Orthopedics - Dr. [name]   Dermatology - Dr. [name]   Social Determinants of Health   Financial Resource Strain: Low Risk    Difficulty of Paying Living Expenses: Not hard at all  Food Insecurity: No Food Insecurity   Worried About [place] in the Last Year: Never true   Fielding in the Last Year: Never true  Transportation Needs: No Transportation Needs   Lack of Transportation (Medical): No   Lack of Transportation (Non-Medical): No  Physical Activity: Insufficiently Active   Days of Exercise per Week: 3 days   Minutes of Exercise per Session: 30 min  Stress: No Stress Concern Present   Feeling of Stress : Not at all  Social Connections: Moderately Integrated   Frequency of Communication with Friends and Family: More than three times a week   Frequency of Social Gatherings with Friends and Family: Once a week   Attends Religious Services: More than 4 times per year   Active Member of [employer] or Organizations: No   Attends Music therapist: Never   Marital Status: Married  Human resources officer Violence: Not At Risk   Fear of Current or Ex-Partner: No   Emotionally Abused: No   Physically Abused: No   Sexually Abused: No     Family History  Problem Relation Age of Onset   Cancer Mother        colon   Colon cancer Mother 70   Pulmonary fibrosis Father        father owned a Medical sales representative business (father blamed inhalation of chemicals and fumes)   Pulmonary fibrosis Brother    Coronary artery disease Other    Hyperlipidemia Other    Colon polyps Neg Hx    Esophageal cancer Neg Hx    Rectal cancer Neg Hx    Stomach cancer Neg Hx       Review of Systems: All other systems reviewed and are otherwise negative except as noted above.  Physical Exam: Vitals:   03/06/2021 0233 03/08/2021 0424 03/01/2021 0700 03/20/2021 0800  BP: 133/68 (!) 111/100 (!)  148/68 (!) 148/68  Pulse: 75 64 66 66  Resp: (!) 29 (!) 24 (!) 21 (!) 21  Temp: 98.8 F (37.1 C) 98.1 F (36.7 C) 98.[name] F (36.8 C) 98.[name] F (36.8 C)  TempSrc: Axillary Oral Axillary Axillary  SpO2: 95% 95% 94% 96%  Weight:      Height:        GEN- The patient is well appearing, alert and oriented x 3 today.   Head- normocephalic, atraumatic Eyes-  Sclera clear, conjunctiva pink Ears- hearing intact Oropharynx- clear Neck- supple Lungs- Clear to ausculation bilaterally, normal work of breathing Heart- Regular rate and rhythm, no murmurs, rubs or gallops  GI- soft, NT, ND, + BS Extremities- no clubbing, cyanosis, or edema MS- no significant deformity or atrophy Skin- no rash or lesion Psych- euthymic mood, full affect   Labs:   Lab Results  Component Value Date   WBC 16.3 (H) 03/30/2021   HGB 10.6 (L) 03/01/2021   HCT 32.4 (L) 03/08/2021   MCV 88.8 03/08/2021   PLT 264 03/23/2021    Recent Labs  Lab 03/06/21 0515 03/07/21 0137 03/01/2021 0140  NA 136   < > 139  K 3.7   < > 3.[name]*  CL 112*   < > 111  CO2 18*   < > 20*  BUN 21   < > 23  CREATININE 0.99   < >  0.99  CALCIUM 9.8   < > 9.1  PROT 5.9*  --   --   BILITOT 1.1  --   --   ALKPHOS 98  --   --   ALT 44  --   --   AST 44*  --   --   GLUCOSE 98   < > 119*   < > = values in this interval not displayed.     Radiology/Studies: CT ANGIO HEAD NECK W WO CM  Result Date: 03/05/2021 CLINICAL DATA:  Provided history: Neuro deficit, acute, stroke suspected. Left-sided weakness. EXAM: CT ANGIOGRAPHY HEAD AND NECK TECHNIQUE: Multidetector CT imaging of the head and neck was performed using the standard protocol during bolus administration of intravenous contrast. Multiplanar CT image reconstructions and MIPs were obtained to evaluate the vascular anatomy. Carotid stenosis measurements (when applicable) are obtained utilizing NASCET criteria, using the distal internal carotid diameter as the denominator. CONTRAST:  21m  OMNIPAQUE IOHEXOL 350 MG/ML SOLN COMPARISON:  Noncontrast head CT performed earlier today 03/05/2021. Thyroid ultrasound 02/27/2020. FINDINGS: CTA NECK FINDINGS Aortic arch: Standard aortic branching. Atherosclerotic plaque within the visualized aortic arch and proximal major branch vessels of the neck. Atherosclerotic plaque within the visualized aortic arch and proximal major branch vessels of the neck. No hemodynamically significant innominate or proximal subclavian artery stenosis. Right carotid system: CCA and ICA patent within the neck. Prominent irregular hypodensity within the proximal right ICA. A portion of this likely reflects soft plaque. However, the irregular portion is highly suspicious for superimposed thrombus/unstable plaque (for instance as seen on series 9, image 61). Resultant severe stenosis of the proximal ICA estimated to be 80% (series 7, image 327). Minimal calcified plaque is also present about the carotid bifurcation. Left carotid system: CCA and ICA patent within neck without stenosis. Minimal atherosclerotic plaque about the carotid bifurcation and within the proximal ICA. Vertebral arteries: Vertebral arteries patent within the neck. Nonstenotic plaque at the origin of both vessels. The left vertebral artery is dominant. Skeleton: Partially imaged thoracic dextrocurvature. Cervical and upper thoracic spondylosis. Multilevel bridging ventral osteophytes within the imaged upper thoracic spine. No acute bony abnormality or aggressive osseous lesion. Other neck: No cervical lymphadenopathy. 3 cm exophytic nodule extending inferiorly from the right thyroid lobe in the right paratracheal region (for instance as seen on series 7, image 288). 8 mm nodule within the inferior right thyroid lobe. Upper chest: No consolidation within the imaged lung apices. Review of the MIP images confirms the above findings CTA HEAD FINDINGS Anterior circulation: The intracranial internal carotid arteries are  patent. Atherosclerotic plaque within both vessels. Moderate stenosis of the paraclinoid right ICA. Mild stenosis of the paraclinoid left ICA. The M1 middle cerebral arteries are patent. No M2 proximal branch occlusion is identified. Atherosclerotic irregularity of the M2 and more distal MCA vessels, bilaterally. Most notably, there is an apparent moderate/severe stenosis within superior division mid M2 right MCA vessel (series 12, image 17). The anterior cerebral arteries are patent. Hypoplastic left A1 segment. Atherosclerotic irregularity of both anterior cerebral arteries. Most notably, there is a moderate stenosis within the A4 left ACA. No intracranial aneurysm is identified. Posterior circulation: The intracranial vertebral arteries are patent. The basilar artery is patent. The posterior cerebral arteries are patent. Posterior communicating arteries are diminutive or absent bilaterally. Venous sinuses: Within the limitations of contrast timing, no convincing thrombus. Anatomic variants: As described. Review of the MIP images confirms the above findings CTA neck impression #1 and CTA head impressions #  1 and #3 called by telephone at the time of interpretation on 03/05/2021 at [name]:42 pm to provider Dr. Quinn Axe, who verbally acknowledged these results. IMPRESSION: CTA neck: 1. Prominent irregular hypodensity within the proximal right ICA. A portion of this hypodensity likely reflects soft plaque. However, the irregular portion is highly suspicious for superimposed thrombus (or unstable plaque). Resultant severe stenosis of the proximal right ICA estimated to be 80%. [name]. The left common carotid, left internal carotid and bilateral vertebral arteries are patent within the neck without significant stenosis. Mild atherosclerotic plaque within these vessels, as described. 3. 3 cm exophytic nodule extending inferiorly from the right thyroid lobe. This nodule was not described on the prior thyroid ultrasound of 02/27/2020. A  repeat thyroid ultrasound is recommended to further characterize this nodule. 4.  Aortic Atherosclerosis (ICD10-I70.0). CTA head: 1. No intracranial large vessel occlusion is identified. [name]. Intracranial atherosclerotic disease with multifocal stenoses, most notably as follows. 3. Moderate/severe stenosis within a superior division mid M2 right MCA vessel. 4. Moderate stenosis within the A4 left anterior cerebral artery. 5. Moderate stenosis of the paraclinoid right ICA. Electronically Signed   By: Kellie Simmering D.O.   On: 03/05/2021 15:10   DG Chest [name] View  Result Date: 02/26/2021 CLINICAL DATA:  Suspected sepsis. Congestion started 1 month ago. Cough. EXAM: CHEST - [name] VIEW COMPARISON:  02/25/2021 FINDINGS: Low lung volumes. Heart size is accentuated by technique and low lung volumes. Minimal bibasilar atelectasis. No consolidations. No evidence for pulmonary edema. IMPRESSION: Low lung volumes.  No evidence for acute  abnormality. Electronically Signed   By: Nolon Nations M.D.   On: 02/26/2021 13:52   DG Chest [name] View  Result Date: 02/26/2021 CLINICAL DATA:  Cough. EXAM: CHEST - [name] VIEW COMPARISON:  May 19, 2020. FINDINGS: The heart size and mediastinal contours are within normal limits. Both lungs are clear. Stable hiatal hernia. The visualized skeletal structures are unremarkable. IMPRESSION: No active cardiopulmonary disease.  Stable hiatal hernia. Electronically Signed   By: Marijo Conception M.D.   On: 02/26/2021 11:48   MR BRAIN WO CONTRAST  Result Date: 03/05/2021 CLINICAL DATA:  Left-sided weakness EXAM: MRI HEAD WITHOUT CONTRAST TECHNIQUE: Multiplanar, multiecho pulse sequences of the brain and surrounding structures were obtained without intravenous contrast. COMPARISON:  None. FINDINGS: Brain: Multifocal acute cortical ischemia in the anterior right MCA territory. Small focus of acute/subacute ischemia at the left caudate head. No acute or chronic hemorrhage. There is multifocal hyperintense  T2-weighted signal within the white matter. Parenchymal volume and CSF spaces are normal. The midline structures are normal. Vascular: Major flow voids are preserved. Skull and upper cervical spine: Normal calvarium and skull base. Visualized upper cervical spine and soft tissues are normal. Sinuses/Orbits:No paranasal sinus fluid levels or advanced mucosal thickening. No mastoid or middle ear effusion. Normal orbits. IMPRESSION: 1. Multifocal acute/early subacute cortical ischemia in the anterior right MCA territory. No hemorrhage or mass effect. [name]. Small focus of acute/early subacute ischemia at the left caudate head. Electronically Signed   By: Ulyses Jarred M.D.   On: 03/05/2021 21:47   IR INTRAVSC STENT CERV CAROTID W/EMB-PROT MOD SED  Result Date: 03/08/2021 INDICATION: 81 year old male with past medical history significant for coronary artery have anemia, hypertension, remote history of prostate cancer and renal oncocytoma. He presented to emergency department with left-sided weakness. NIHSS at presentation 8; baseline modified Rankin scale 1. His last known well was unclear, probably 10 a.m. on March 05, 2021. Head CT showed large  acute territory infarct or hemorrhage. CT angiogram of the head and neck showed critical stenosis of the cervical right ICA with suspected intraluminal thrombus and no intracranial large vessel occlusion. Given acute neurological symptoms with possible acutely thrombosed right carotid plaque, decision was made to proceed with an emergency right carotid stent. EXAM: ULTRASOUND-GUIDED VASCULAR ACCESS DIAGNOSTIC CEREBRAL ANGIOGRAM RIGHT CAROTID STENTING WITH CEREBRAL PROTECTION DEVICE COMPARISON:  CT/CT angiogram of the head and neck March 05, 2021. MEDICATIONS: Intravenous Cangrelor bolus and drip. ANESTHESIA/SEDATION: Procedure was performed under monitored anesthesia care (MAC). CONTRAST:  100 mL of Omnipaque 300 mg per FLUOROSCOPY TIME:  Fluoroscopy Time: 10 minutes 48  seconds (430 mGy). COMPLICATIONS: None immediate. TECHNIQUE: Informed written consent was obtained from the patient after a thorough discussion of the procedural risks, benefits and alternatives. All questions were addressed. Maximal Sterile Barrier Technique was utilized including caps, mask, sterile gowns, sterile gloves, sterile drape, hand hygiene and skin antiseptic. A timeout was performed prior to the initiation of the procedure. The right groin was prepped and draped in the usual sterile fashion. The right groin area was infiltrated with lidocaine 1% at the level of the right common femoral artery, under ultrasound guidance. Using a micropuncture kit and the modified Seldinger technique, access was gained to the right common femoral artery and an 8 French sheath was placed. Real-time ultrasound guidance was utilized for vascular access including the acquisition of a permanent ultrasound image documenting patency of the accessed vessel. Under fluoroscopy, an 8 [place] Walrus balloon guide catheter was navigated over a 6 [place] Berenstein [name] catheter and a 0.035" Terumo Glidewire into the aortic arch. The catheter was placed into the right common carotid artery. The diagnostic catheter was removed. Right common carotid artery angiogram with right anterior oblique view of the neck was obtained. Then, angiograms with frontal and lateral views of the were obtained. FINDINGS: 1. The right common femoral artery is patent, with adequate caliber for vascular access. [name]. Prominent atherosclerotic changes at the right carotid bifurcation with ulcerated plaque as well as filling defect projecting in the vein, suspicious for acute thrombus. Findings result in approximately 85% stenosis of the right ICA. 3. Brisk contrast opacification of the right MCA and bilateral ACA vascular tree without evidence of intracranial arterial occlusion. PROCEDURE: A right common carotid artery angiogram was obtained with right anterior  oblique view in utilized as roadmap. Then, a [name].5-4.8 mm Emboshield nav 6 cerebral protection device was navigated and deployed into the distal cervical segment of the right ICA. Then, a 6-8 x 40 mm XACT carotid stent was navigated into the right carotid bifurcation. The guiding catheter balloon was inflated and the catheter was connected to an aspiration pump. The stent was then deployed spanning the distal right common carotid artery and proximal cervical right internal carotid artery under constant aspiration. The guiding catheter balloon was deflated and aspiration was stopped. Clear blood return was noted from the guide catheter. The cerebral protection device was subsequently recaptured. Right common carotid artery angiograms with right anterior oblique view of the neck showed adequate stent position with resolution of stenosis. Angiograms with frontal, lateral and right anterior oblique views of the head showed no evidence of thromboembolic complication. Delayed angiograms with right anterior oblique view of the neck showed patent stent with no evidence of in stent left formation. The catheter was subsequently withdrawn. A right common femoral artery angiogram was obtained in right anterior oblique view of via sheath side port contrast injection. The puncture is at the  level of the mid right common femoral artery which has normal caliber with no evidence of significant atherosclerotic disease. The right common femoral artery sheath was exchanged over the wire for a Perclose pro style which was utilized for access closure. Immediate hemostasis was achieved. IMPRESSION: 1. Successful cervical right carotid stenting with cerebral protection device for treatment of acutely symptomatic right carotid severe stenosis. [name]. No evidence of thromboembolic complication. PLAN: 1. Dual anti-platelet with aspirin and Brilinta. [name]. Follow-up carotid duplex in 3 months. Electronically Signed   By: [name]  M.D.   On: 03/08/2021 10:19   IR US Guide Vasc Access Right  Result Date: 03/08/2021 INDICATION: 81 year old male with past medical history significant for coronary artery have anemia, hypertension, remote history of prostate cancer and renal oncocytoma. He presented to emergency department with left-sided weakness. NIHSS at presentation 8; baseline modified Rankin scale 1. His last known well was unclear, probably 10 a.m. on March 05, 2021. Head CT showed large acute territory infarct or hemorrhage. CT angiogram of the head and neck showed critical stenosis of the cervical right ICA with suspected intraluminal thrombus and no intracranial large vessel occlusion. Given acute neurological symptoms with possible acutely thrombosed right carotid plaque, decision was made to proceed with an emergency right carotid stent. EXAM: ULTRASOUND-GUIDED VASCULAR ACCESS DIAGNOSTIC CEREBRAL ANGIOGRAM RIGHT CAROTID STENTING WITH CEREBRAL PROTECTION DEVICE COMPARISON:  CT/CT angiogram of the head and neck March 05, 2021. MEDICATIONS: Intravenous Cangrelor bolus and drip. ANESTHESIA/SEDATION: Procedure was performed under monitored anesthesia care (MAC). CONTRAST:  100 mL of Omnipaque 300 mg per FLUOROSCOPY TIME:  Fluoroscopy Time: 10 minutes 48 seconds (430 mGy). COMPLICATIONS: None immediate. TECHNIQUE: Informed written consent was obtained from the patient after a thorough discussion of the procedural risks, benefits and alternatives. All questions were addressed. Maximal Sterile Barrier Technique was utilized including caps, mask, sterile gowns, sterile gloves, sterile drape, hand hygiene and skin antiseptic. A timeout was performed prior to the initiation of the procedure. The right groin was prepped and draped in the usual sterile fashion. The right groin area was infiltrated with lidocaine 1% at the level of the right common femoral artery, under ultrasound guidance. Using a micropuncture kit and the modified Seldinger  technique, access was gained to the right common femoral artery and an 8 French sheath was placed. Real-time ultrasound guidance was utilized for vascular access including the acquisition of a permanent ultrasound image documenting patency of the accessed vessel. Under fluoroscopy, an 8 [place] Walrus balloon guide catheter was navigated over a 6 [place] Berenstein [name] catheter and a 0.035" Terumo Glidewire into the aortic arch. The catheter was placed into the right common carotid artery. The diagnostic catheter was removed. Right common carotid artery angiogram with right anterior oblique view of the neck was obtained. Then, angiograms with frontal and lateral views of the were obtained. FINDINGS: 1. The right common femoral artery is patent, with adequate caliber for vascular access. [name]. Prominent atherosclerotic changes at the right carotid bifurcation with ulcerated plaque as well as filling defect projecting in the vein, suspicious for acute thrombus. Findings result in approximately 85% stenosis of the right ICA. 3. Brisk contrast opacification of the right MCA and bilateral ACA vascular tree without evidence of intracranial arterial occlusion. PROCEDURE: A right common carotid artery angiogram was obtained with right anterior oblique view in utilized as roadmap. Then, a [name].5-4.8 mm Emboshield nav 6 cerebral protection device was navigated and deployed into the distal cervical segment of the right  ICA. Then, a 6-8 x 40 mm XACT carotid stent was navigated into the right carotid bifurcation. The guiding catheter balloon was inflated and the catheter was connected to an aspiration pump. The stent was then deployed spanning the distal right common carotid artery and proximal cervical right internal carotid artery under constant aspiration. The guiding catheter balloon was deflated and aspiration was stopped. Clear blood return was noted from the guide catheter. The cerebral protection device was subsequently  recaptured. Right common carotid artery angiograms with right anterior oblique view of the neck showed adequate stent position with resolution of stenosis. Angiograms with frontal, lateral and right anterior oblique views of the head showed no evidence of thromboembolic complication. Delayed angiograms with right anterior oblique view of the neck showed patent stent with no evidence of in stent left formation. The catheter was subsequently withdrawn. A right common femoral artery angiogram was obtained in right anterior oblique view of via sheath side port contrast injection. The puncture is at the level of the mid right common femoral artery which has normal caliber with no evidence of significant atherosclerotic disease. The right common femoral artery sheath was exchanged over the wire for a Perclose pro style which was utilized for access closure. Immediate hemostasis was achieved. IMPRESSION: 1. Successful cervical right carotid stenting with cerebral protection device for treatment of acutely symptomatic right carotid severe stenosis. [name]. No evidence of thromboembolic complication. PLAN: 1. Dual anti-platelet with aspirin and Brilinta. [name]. Follow-up carotid duplex in 3 months. Electronically Signed   By: [name] M.D.   On: 03/08/2021 10:19   DG CHEST PORT 1 VIEW  Result Date: 03/07/2021 CLINICAL DATA:  Respiratory abnormalities. EXAM: PORTABLE CHEST 1 VIEW COMPARISON:  Radiograph 02/26/2021 FINDINGS: Lung volumes are low. Stable heart size and mediastinal contours. Patchy opacity in both lower lung zones. Mild additional patchy opacity in the right suprahilar lung. Vascular congestion. No pleural effusion. Anti lordotic positioning. IMPRESSION: 1. Low lung volumes with patchy bibasilar and right suprahilar opacities, may represent multifocal pneumonia, particularly at the right lung base. [name]. Vascular congestion. Electronically Signed   By: Keith Rake M.D.   On: 03/07/2021 17:35    DG Swallowing Func-Speech Pathology  Result Date: 02/28/2021 Table formatting from the original result was not included. Objective Swallowing Evaluation: Type of Study: MBS-Modified Barium Swallow Study  Patient Details Name: CHRISTOPH COPELAN MRN: [id number] Date of Birth: Aug 16, 1940 Today's Date: 03/22/2021 Time: SLP Start Time (ACUTE ONLY): 0900 -SLP Stop Time (ACUTE ONLY): 0930 SLP Time Calculation (min) (ACUTE ONLY): 30 min Past Medical History: Past Medical History: Diagnosis Date  Allergy   Arthritis   gen.  and left hip  BPH (benign prostatic hypertrophy)   Coronary artery disease   GERD (gastroesophageal reflux disease)   occasional  History of kidney stones   2007  Hyperlipidemia   Hypertension   Kidney tumor   LBP (low back pain) 06/2005  L5 radicular symptoms; MRI of LS spine severe spondylosis at L4-L5 with central cancal stenosis   Nocturia   Organic impotence   Prostate cancer ([place]) 10/2011  (Low grade) [facility] - Dr. Junious Silk  Pyelonephritis 03/05/2013  Renal oncocytoma of right kidney 01/23/2013  As 3.[name] x 3.5 cm enhancing exophytic mass projecting off the lower pole. This is consistent with a renal cell carcinoma. No retroperitoneal lymphadenopathy.   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  Wears glasses  Past Surgical History: Past Surgical History: Procedure Laterality Date  COLONOSCOPY  02/15/2006, 2014  Internal hemorrhoids (Dr [name]), last in 2014  [place] Right 1980  IR ANGIO INTRA EXTRACRAN SEL INTERNAL CAROTID UNI R MOD SED  03/05/2021  IR INTRAVSC STENT CERV CAROTID W/EMB-PROT MOD SED INCL ANGIO  03/05/2021  IR US GUIDE VASC ACCESS RIGHT  03/05/2021  LYMPHADENECTOMY Bilateral 02/15/2013  Procedure: LYMPHADENECTOMY WITH INDOCYANINE GREEN DYE INJECTION;  Surgeon: [name], MD;  Location: [facility];  Service: Urology;  Laterality: Bilateral;  PROSTATE BIOPSY N/A 12/18/2012  Procedure: BIOPSY TRANSRECTAL ULTRASONIC PROSTATE (TUBP);  Surgeon: [name], MD;   Location: [facility];  Service: Urology;  Laterality: N/A;  PROSTATE BIOPSY  12/05/11  gleason 6, 3/12 cores  RADIOLOGY WITH ANESTHESIA N/A 03/05/2021  Procedure: IR WITH ANESTHESIA;  Surgeon: Radiologist, Medication, MD;  Location: [facility];  Service: Radiology;  Laterality: N/A;  REPAIR UMBILICAL AND VENTRAL HERNIA'S W/ MESH  08-21-2009  RIGHT/LEFT HEART CATH AND CORONARY ANGIOGRAPHY N/A 08/06/2019  Procedure: RIGHT/LEFT HEART CATH AND CORONARY ANGIOGRAPHY;  Surgeon: [name], Peter M, MD;  Location: [facility];  Service: Cardiovascular;  Laterality: N/A;  ROBOT ASSISTED LAPAROSCOPIC RADICAL PROSTATECTOMY N/A 02/15/2013  Procedure: ROBOTIC ASSISTED LAPAROSCOPIC RADICAL PROSTATECTOMY;  Surgeon: [name], MD;  Location: [facility];  Service: Urology;  Laterality: N/A;  ROBOTIC ASSITED PARTIAL NEPHRECTOMY Right 02/15/2013  Procedure: ROBOTIC ASSITED PARTIAL NEPHRECTOMY;  Surgeon: [name], MD;  Location: [facility];  Service: Urology;  Laterality: Right;  SPERMATOCELECTOMY Left 12/18/2012  Procedure: LEFT SPERMATOCELECTOMY;  Surgeon: [name], MD;  Location[facility];  Service: Urology;  Laterality: Left;  TONSILLECTOMY AND ADENOIDECTOMY  as child  TOTAL KNEE ARTHROPLASTY Left 05/26/2020  Procedure: TOTAL KNEE ARTHROPLASTY;  Surgeon: Paralee Cancel, MD;  Location: [facility];  Service: Orthopedics;  Laterality: Left;  70 mins  TRANSURETHRAL RESECTION OF PROSTATE  AGE 64 HPI: Pt is a 81 y/o male presented to ED on 03/05/21 for L sided weakness. Recent flue dx. CTA showed R ICA occlusion. S/p cerebral angiogram and R carotid stenting on 1/6. MRI showed multifocal acute/early subacute cortical ischemia in anterior R MCA and small focus of acute/early subacute ischemia at L caudate head. Pt evaluated by SLP for dysarthria, MD then ordered swallow eval due to persistent pna and complaint of coughing while eating. PMH: HTN, CAD, prostate cancer  No data recorded  Recommendations  for follow up therapy are one component of a multi-disciplinary discharge planning process, led by the attending physician.  Recommendations may be updated based on patient status, additional functional criteria and insurance authorization. Assessment / Plan / Recommendation Clinical Impressions 03/18/2021 Clinical Impression  Pt demonstrates a moderate to severe pharyngeal dysphagia, likely acute on chronic in nature. Pt demonstrates structural changes in pharynx impacting function that are likely related to curvature of spinal column. Pt has initiation of swallow at pyriform sinuses, leading to instances of sensed aspiration of thin liquids before the swallow without expectoration (PAS 7). Pt additionally has moderate residue with liquid and solid boluses due to restricted bolus flow (decreased epiglottic deflection and UES opening) appearing secondary to curvature of cervical spine. SLP able to cue pt to complete a chin tuck which reduced pyriform residue and also complete preventative throat clears and second swallow to clear nectar thick penetrate. Pt fatigued significantly during exam and generalized weakness is increasing pts risk at this time. Question if a dys 3 (mech soft diet) and nectar thick liqudis Cristela Stalder be tolerated despite efforts. Cova Knieriem attempt modified diet and strengthing interventions. Pt recommended  to f/u with AIR. SLP Visit Diagnosis Dysphagia, pharyngeal phase (R13.13) Attention and concentration deficit following -- Frontal lobe and executive function deficit following -- Impact on safety and function Moderate aspiration risk;Risk for inadequate nutrition/hydration   Treatment Recommendations 03/01/2021 Treatment Recommendations Therapy as outlined in treatment plan below   Prognosis 03/13/2021 Prognosis for Safe Diet Advancement Fair Barriers to Reach Goals -- Barriers/Prognosis Comment -- Diet Recommendations 03/03/2021 SLP Diet Recommendations Dysphagia 3 (Mech soft) solids;Nectar thick liquid  Liquid Administration via Cup;Straw Medication Administration Crushed with puree Compensations Slow rate;Small sips/bites;Follow solids with liquid;Clear throat intermittently;Effortful swallow;Chin tuck;Multiple dry swallows after each bite/sip Postural Changes Remain semi-upright after after feeds/meals (Comment);Seated upright at 90 degrees   Other Recommendations 03/13/2021 Recommended Consults -- Oral Care Recommendations Oral care BID Other Recommendations Have oral suction available Follow Up Recommendations Acute inpatient rehab (3hours/day) Assistance recommended at discharge -- Functional Status Assessment Patient has had a recent decline in their functional status and demonstrates the ability to make significant improvements in function in a reasonable and predictable amount of time. Frequency and Duration  03/02/2021 Speech Therapy Frequency (ACUTE ONLY) min 2x/week Treatment Duration [name] weeks   Oral Phase 03/12/2021 Oral Phase Impaired Oral - Pudding Teaspoon -- Oral - Pudding Cup -- Oral - Honey Teaspoon -- Oral - Honey Cup -- Oral - Nectar Teaspoon -- Oral - Nectar Cup Lingual/palatal residue Oral - Nectar Straw Lingual/palatal residue Oral - Thin Teaspoon Lingual/palatal residue Oral - Thin Cup Lingual/palatal residue Oral - Thin Straw Lingual/palatal residue Oral - Puree Lingual/palatal residue Oral - Mech Soft Lingual/palatal residue Oral - Regular -- Oral - Multi-Consistency -- Oral - Pill -- Oral Phase - Comment --  Pharyngeal Phase 03/10/2021 Pharyngeal Phase Impaired Pharyngeal- Pudding Teaspoon -- Pharyngeal -- Pharyngeal- Pudding Cup -- Pharyngeal -- Pharyngeal- Honey Teaspoon -- Pharyngeal -- Pharyngeal- Honey Cup -- Pharyngeal -- Pharyngeal- Nectar Teaspoon -- Pharyngeal -- Pharyngeal- Nectar Cup Delayed swallow initiation-pyriform sinuses;Reduced epiglottic inversion;Penetration/Apiration after swallow;Pharyngeal residue - valleculae;Pharyngeal residue - pyriform;Compensatory strategies  attempted (with notebox) Pharyngeal Material enters airway, CONTACTS cords and not ejected out;Material enters airway, remains ABOVE vocal cords and not ejected out;Material does not enter airway Pharyngeal- Nectar Straw Delayed swallow initiation-pyriform sinuses;Reduced epiglottic inversion;Penetration/Apiration after swallow;Pharyngeal residue - valleculae;Pharyngeal residue - pyriform;Compensatory strategies attempted (with notebox) Pharyngeal Material enters airway, CONTACTS cords and not ejected out;Material enters airway, remains ABOVE vocal cords and not ejected out;Material does not enter airway Pharyngeal- Thin Teaspoon Delayed swallow initiation-pyriform sinuses;Reduced epiglottic inversion;Penetration/Aspiration before swallow;Trace aspiration Pharyngeal Material enters airway, passes BELOW cords and not ejected out despite cough attempt by patient Pharyngeal- Thin Cup Delayed swallow initiation-pyriform sinuses;Reduced epiglottic inversion;Penetration/Apiration after swallow;Pharyngeal residue - valleculae;Pharyngeal residue - pyriform;Compensatory strategies attempted (with notebox) Pharyngeal Material enters airway, CONTACTS cords and not ejected out;Material enters airway, remains ABOVE vocal cords and not ejected out;Material does not enter airway Pharyngeal- Thin Straw Delayed swallow initiation-pyriform sinuses;Reduced epiglottic inversion;Penetration/Apiration after swallow;Pharyngeal residue - valleculae;Pharyngeal residue - pyriform;Compensatory strategies attempted (with notebox) Pharyngeal Material enters airway, CONTACTS cords and not ejected out;Material enters airway, remains ABOVE vocal cords and not ejected out;Material does not enter airway Pharyngeal- Puree Delayed swallow initiation-pyriform sinuses;Reduced epiglottic inversion;Pharyngeal residue - valleculae;Pharyngeal residue - pyriform;Compensatory strategies attempted (with notebox) Pharyngeal -- Pharyngeal- Mechanical Soft  Delayed swallow initiation-pyriform sinuses;Reduced epiglottic inversion;Pharyngeal residue - valleculae;Pharyngeal residue - pyriform;Compensatory strategies attempted (with notebox) Pharyngeal -- Pharyngeal- Regular -- Pharyngeal -- Pharyngeal- Multi-consistency -- Pharyngeal -- Pharyngeal- Pill -- Pharyngeal -- Pharyngeal Comment --  Cervical Esophageal Phase  03/19/2021 Cervical Esophageal Phase Impaired Pudding Teaspoon -- Pudding Cup -- Honey Teaspoon -- Honey Cup -- Nectar Teaspoon -- Nectar Cup Reduced cricopharyngeal relaxation;Esophageal backflow into cervical esophagus;Esophageal backflow into the pharynx Nectar Straw Reduced cricopharyngeal relaxation;Esophageal backflow into cervical esophagus;Esophageal backflow into the pharynx Thin Teaspoon -- Thin Cup -- Thin Straw -- Puree -- Mechanical Soft -- Regular -- Multi-consistency -- Pill -- Cervical Esophageal Comment see impression DeBlois, Katherene Ponto 03/17/2021, 10:07 AM                     ECHOCARDIOGRAM COMPLETE  Result Date: 03/06/2021    ECHOCARDIOGRAM REPORT   Patient Name:   TIBOR LEMMONS Date of Exam: 03/06/2021 Medical Rec #:  [id number]         Height:       72.0 in Accession #:    [id number]        Weight:       210.1 lb Date of Birth:  05-12-1940          BSA:          [name].176 m Patient Age:    64 years          BP:           143/66 mmHg Patient Gender: M                 HR:           93 bpm. Exam Location:  Inpatient Procedure: 2D Echo, Cardiac Doppler, Color Doppler and Intracardiac            Opacification Agent Indications:    Stroke  History:        Patient has prior history of Echocardiogram examinations, most                 recent 06/13/2019.  Sonographer:    Clayton Lefort RDCS (AE) Referring Phys: [id number] Derek Jack  Sonographer Comments: Technically difficult study due to poor echo windows and no subcostal window. Image acquisition challenging due to respiratory motion. Patient coughing throughout test. IMPRESSIONS  1. Left  ventricular ejection fraction, by estimation, is 65 to 70%. The left ventricle has normal function. The left ventricle has no regional wall motion abnormalities. There is moderate left ventricular hypertrophy. Left ventricular diastolic parameters are indeterminate.  [name]. Right ventricular systolic function is normal. The right ventricular size is normal.  3. The mitral valve is normal in structure. No evidence of mitral valve regurgitation. No evidence of mitral stenosis.  4. The aortic valve is normal in structure. Aortic valve regurgitation is not visualized. No aortic stenosis is present.  5. The inferior vena cava is normal in size with greater than 50% respiratory variability, suggesting right atrial pressure of 3 mmHg. Comparison(s): Prior ascending aorta 42-43 mm. Unable to visual in current study. Conclusion(s)/Recommendation(s): No intracardiac source of embolism detected on this transthoracic study. Consider a transesophageal echocardiogram to exclude cardiac source of embolism if clinically indicated. FINDINGS  Left Ventricle: Left ventricular ejection fraction, by estimation, is 65 to 70%. The left ventricle has normal function. The left ventricle has no regional wall motion abnormalities. The left ventricular internal cavity size was normal in size. There is  moderate left ventricular hypertrophy. Left ventricular diastolic parameters are indeterminate. Right Ventricle: The right ventricular size is normal. No increase in right ventricular wall thickness. Right ventricular systolic function is normal. Left Atrium: Left atrial size was normal in size. Right Atrium: Right atrial size  was normal in size. Pericardium: There is no evidence of pericardial effusion. Mitral Valve: The mitral valve is normal in structure. No evidence of mitral valve regurgitation. No evidence of mitral valve stenosis. Tricuspid Valve: The tricuspid valve is normal in structure. Tricuspid valve regurgitation is not demonstrated.  No evidence of tricuspid stenosis. Aortic Valve: The aortic valve is normal in structure. Aortic valve regurgitation is not visualized. No aortic stenosis is present. Aortic valve mean gradient measures 7.0 mmHg. Aortic valve peak gradient measures 14.1 mmHg. Aortic valve area, by VTI measures 3.49 cm. Pulmonic Valve: The pulmonic valve was normal in structure. Pulmonic valve regurgitation is not visualized. No evidence of pulmonic stenosis. Aorta: The aortic root is normal in size and structure. Venous: The inferior vena cava is normal in size with greater than 50% respiratory variability, suggesting right atrial pressure of 3 mmHg. IAS/Shunts: No atrial level shunt detected by color flow Doppler.  LEFT VENTRICLE PLAX 2D LVIDd:         4.70 cm LVIDs:         [name].90 cm LV PW:         1.60 cm LV IVS:        1.80 cm LVOT diam:     [name].30 cm LV SV:         108 LV SV Index:   49 LVOT Area:     4.15 cm  RIGHT VENTRICLE RV S prime:     17.40 cm/s TAPSE (M-mode): [name].4 cm LEFT ATRIUM           Index LA diam:      1.50 cm 0.69 cm/m LA Vol (A2C): 36.[name] ml 16.64 ml/m LA Vol (A4C): 35.1 ml 16.13 ml/m  AORTIC VALVE AV Area (Vmax):    3.14 cm AV Area (Vmean):   3.59 cm AV Area (VTI):     3.49 cm AV Vmax:           188.00 cm/s AV Vmean:          126.000 cm/s AV VTI:            0.308 m AV Peak Grad:      14.1 mmHg AV Mean Grad:      7.0 mmHg LVOT Vmax:         142.00 cm/s LVOT Vmean:        109.000 cm/s LVOT VTI:          0.259 m LVOT/AV VTI ratio: 0.84  AORTA Ao Asc diam: 3.70 cm MITRAL VALVE MV Area (PHT): [name].87 cm     SHUNTS MV Decel Time: 264 msec     Systemic VTI:  0.26 m MV E velocity: 92.40 cm/s   Systemic Diam: [name].30 cm MV A velocity: 111.00 cm/s MV E/A ratio:  0.83 Candee Furbish MD Electronically signed by Candee Furbish MD Signature Date/Time: 03/06/2021/[name]:18:39 PM    Final    US THYROID  Result Date: 03/08/2021 CLINICAL DATA:  Incidental on CT. EXAM: THYROID ULTRASOUND TECHNIQUE: Ultrasound examination of the thyroid gland  and adjacent soft tissues was performed. COMPARISON:  03/05/2021, 02/27/2020 FINDINGS: Parenchymal Echotexture: Mildly heterogenous Isthmus: 0.6 cm, previously 1.3 cm Right lobe: 4.[name] x 1.4 x 1.7 cm, previously 4.5 x [name].3 x 1.8 cm Left lobe: 3.9 x 1.8 x 1.6 cm, previously 4.8 x 1.8 x [name].1 cm _________________________________________________________ Estimated total number of nodules >/= 1 cm: 1 Number of spongiform nodules >/=  [name] cm not described below (TR1): 0 Number of mixed cystic and  solid nodules >/= 1.5 cm not described below ([place]): 0 _________________________________________________________ Nodule # 1: Prior biopsy: No Location: Right; Superior Maximum size: 0.7 cm; Other [name] dimensions: 0.6 x 0.5 cm, previously, 0.6 x 0.6 x 0.5 cm Composition: cannot determine ([name]) Echogenicity: hypoechoic ([name]) Shape: not taller-than-wide (0) Margins: ill-defined (0) Echogenic foci: peripheral calcifications ([name]) ACR TI-RADS total points: 6. ACR TI-RADS risk category:  TR4 (4-6 points). Significant change in size (>/= 20% in two dimensions and minimal increase of [name] mm): No Change in features: No Change in ACR TI-RADS risk category: No ACR TI-RADS recommendations: Given size (<0.9 cm) and appearance, this nodule does NOT meet TI-RADS criteria for biopsy or dedicated follow-up. _________________________________________________________ Nodule # [name]: Prior biopsy: No Location: Right; Inferior Maximum size: 1.1 cm; Other [name] dimensions: 1.0 x 0.9 cm, previously, 1.1 x 1.0 x 0.9 cm Composition: solid/almost completely solid ([name]) Echogenicity: hypoechoic ([name]) Shape: not taller-than-wide (0) Margins: smooth (0) Echogenic foci: none (0) ACR TI-RADS total points: 4. ACR TI-RADS risk category:  TR4 (4-6 points). Significant change in size (>/= 20% in two dimensions and minimal increase of [name] mm): No Change in features: No Change in ACR TI-RADS risk category: No ACR TI-RADS recommendations: *Given size (>/= 1 - 1.4 cm) and appearance, a follow-up  ultrasound in 1 year should be considered based on TI-RADS criteria. _________________________________________________________ No cervical lymphadenopathy identified. IMPRESSION: 1. The right paratracheal nodule described on recent CT head neck is not visualized sonographically. In retrospect, this CT finding is similar to 06/13/2013 comparison chest CT and appears extrathyroidal. [name]. Unchanged appearance of right inferior solid thyroid nodule (labeled [name], 1.1 cm) which again meets criteria (TI-RADS category 4) for 1 year ultrasound surveillance. The above is in keeping with the ACR TI-RADS recommendations - J Am Coll Radiol 2017;14:587-595. Ruthann Cancer, MD [facility] Electronically Signed   By: Ruthann Cancer M.D.   On: 03/08/2021 08:19   CT HEAD CODE STROKE WO CONTRAST  Result Date: 03/05/2021 CLINICAL DATA:  Code stroke.  Left-sided weakness EXAM: CT HEAD WITHOUT CONTRAST TECHNIQUE: Contiguous axial images were obtained from the base of the skull through the vertex without intravenous contrast. COMPARISON:  None. FINDINGS: Brain: There is no evidence of acute intracranial hemorrhage, extra-axial fluid collection, or acute infarct. Parenchymal volume is normal. The ventricles are normal in size. There is no mass lesion. There is no midline shift. Vascular: No dense vessel is seen. Skull: Normal. Negative for fracture or focal lesion. Sinuses/Orbits: There is layering fluid in the maxillary and sphenoid sinuses. Bilateral lens implants are in place. The globes and orbits are otherwise unremarkable. Other: None. ASPECTS [facility] Stroke Program Early CT Score) - Ganglionic level infarction (caudate, lentiform nuclei, internal capsule, insula, M1-M3 cortex): Seven - Supraganglionic infarction (M4-M6 cortex): 3 Total score (0-10 with 10 being normal): 10 IMPRESSION: 1. No acute intracranial pathology. [name]. ASPECTS is 10 3. Layering fluid in the maxillary and  sphenoid sinuses which can be seen with acute sinusitis in the correct clinical setting. These results were paged via AMION at the time of interpretation on 03/05/2021 at [name]:29 pm to provider Dr Livia Snellen. Electronically Signed   By: Valetta Mole M.D.   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 He presented to emergency department with left-sided  weakness. NIHSS at presentation 8; baseline modified Rankin scale 1. His last known well was unclear, probably 10 a.m. on March 05, 2021. Head CT showed large acute territory infarct or hemorrhage. CT angiogram of the head and neck showed critical stenosis of the cervical right ICA with suspected intraluminal thrombus and no intracranial large vessel occlusion. Given acute neurological symptoms with possible acutely thrombosed right carotid plaque, decision was made to proceed with an emergency right carotid stent. EXAM: ULTRASOUND-GUIDED VASCULAR ACCESS DIAGNOSTIC CEREBRAL ANGIOGRAM RIGHT CAROTID STENTING WITH CEREBRAL PROTECTION DEVICE COMPARISON:  CT/CT angiogram of the head and neck March 05, 2021. MEDICATIONS: Intravenous Cangrelor bolus and drip. ANESTHESIA/SEDATION: Procedure was performed under monitored anesthesia care (MAC). CONTRAST:  100 mL of Omnipaque 300 mg per FLUOROSCOPY TIME:  Fluoroscopy Time: 10 minutes 48 seconds (430 mGy). COMPLICATIONS: None immediate. TECHNIQUE: Informed written consent was obtained from the patient after a thorough discussion of the procedural risks, benefits and alternatives. All questions were addressed. Maximal Sterile Barrier Technique was utilized including caps, mask, sterile gowns, sterile gloves, sterile drape, hand hygiene and skin antiseptic. A timeout was performed prior to the initiation of  the procedure. The right groin was prepped and draped in the usual sterile fashion. The right groin area was infiltrated with lidocaine 1% at the level of the right common femoral artery, under ultrasound guidance. Using a micropuncture kit and the modified Seldinger technique, access was gained to the right common femoral artery and an 8 French sheath was placed. Real-time ultrasound guidance was utilized for vascular access including the acquisition of a permanent ultrasound image documenting patency of the accessed vessel. Under fluoroscopy, an 8 [place] Walrus balloon guide catheter was navigated over a 6 [place] Berenstein [name] catheter and a 0.035" Terumo Glidewire into the aortic arch. The catheter was placed into the right common carotid artery. The diagnostic catheter was removed. Right common carotid artery angiogram with right anterior oblique view of the neck was obtained. Then, angiograms with frontal and lateral views of the were obtained. FINDINGS: 1. The right common femoral artery is patent, with adequate caliber for vascular access. [name]. Prominent atherosclerotic changes at the right carotid bifurcation with ulcerated plaque as well as filling defect projecting in the vein, suspicious for acute thrombus. Findings result in approximately 85% stenosis of the right ICA. 3. Brisk contrast opacification of the right MCA and bilateral ACA vascular tree without evidence of intracranial arterial occlusion. PROCEDURE: A right common carotid artery angiogram was obtained with right anterior oblique view in utilized as roadmap. Then, a [name].5-4.8 mm Emboshield nav 6 cerebral protection device was navigated and deployed into the distal cervical segment of the right ICA. Then, a 6-8 x 40 mm XACT carotid stent was navigated into the right carotid bifurcation. The guiding catheter balloon was inflated and the catheter was connected to an aspiration pump. The stent was then deployed spanning the distal right common carotid  artery and proximal cervical right internal carotid artery under constant aspiration. The guiding catheter balloon was deflated and aspiration was stopped. Clear blood return was noted from the guide catheter. The cerebral protection device was subsequently recaptured. Right common carotid artery angiograms with right anterior oblique view of the neck showed adequate stent position with resolution of stenosis. Angiograms with frontal, lateral and right anterior oblique views of the head showed no evidence of thromboembolic complication. Delayed angiograms with right anterior oblique view of the neck showed patent stent with no evidence of in stent left formation. The catheter was  subsequently withdrawn. A right common femoral artery angiogram was obtained in right anterior oblique view of via sheath side port contrast injection. The puncture is at the level of the mid right common femoral artery which has normal caliber with no evidence of significant atherosclerotic disease. The right common femoral artery sheath was exchanged over the wire for a Perclose pro style which was utilized for access closure. Immediate hemostasis was achieved. IMPRESSION: 1. Successful cervical right carotid stenting with cerebral protection device for treatment of acutely symptomatic right carotid severe stenosis. [name]. No evidence of thromboembolic complication. PLAN: 1. Dual anti-platelet with aspirin and Brilinta. [name]. Follow-up carotid duplex in 3 months. Electronically Signed   By: [name] M.D.   On: 03/08/2021 10:19    12-lead ECG on admission read as afib but shows NSR with noisy baseline (personally reviewed) All prior EKG's in EPIC reviewed with no documented atrial fibrillation. They do show sinus bradycardia and IVCD/ICRBBB  Telemetry NSR/Sinus tach (personally reviewed)  Assessment and Plan:  1. Cryptogenic stroke The patient presents with cryptogenic stroke.  The patient does not have a TEE  planned for this AM.  I spoke at length with the patient about monitoring for afib with an implantable loop recorder.  Risks, benefits, and alteratives to implantable loop recorder were discussed with the patient today.   At this time, the patient is very clear in their decision to proceed with implantable loop recorder.   Wound care was reviewed with the patient (keep incision clean and dry for 3 days).  Wound check scheduled and entered in AVS. Please call with questions.   Shirley Friar, PA-C 03/15/2021 11:01 AM  I have seen and examined this patient with Oda Kilts.  Agree with above, note added to reflect my findings.  On exam, RRR, no murmurs, lungs clear, no edema, no JVD.  Patient presented to the hospital with cryptogenic stroke. To date, no cause has been found. Ether Wolters plan for LINQ monitor to look for atrial fibrillation. Risks and benefits discussed. Risks include but not limited to bleeding and infection. The patient understands the risks and has agreed to the procedure.  Skylynn Burkley M. Mesa Janus MD 03/12/2021 12:47 PM

## 2021-03-09 NOTE — Discharge Instructions (Signed)

## 2021-03-09 NOTE — Discharge Summary (Addendum)
Stroke Discharge Summary  Patient ID: David [name]   MRN: [id number]      DOB: 1940/03/10  Date of Admission: 03/05/2021 Date of Discharge: 03/14/2021  Attending Physician:  Stroke, Md, MD, Stroke MD Consultant(s):   cardiology and rehabilitation medicine Patient's PCP:  Cassandria Anger, MD  Discharge Diagnoses:   Principal Problem:   Stroke (cerebrum) ([place]) - Multifocal acute/early subacute cortical ischemia in the anterior right MCA territory, likely large vessel source from critical R ICA stenosis s/p right ICA stenting. Left caudate small infarct could be procedure related vs. Occult afib  Active Problems:   Pneumonia   R carotid stenosis   Flu A   Frequent PVCs   HTN   HLD   Dysphagia    Thyroid nodule   Medications to be continued on Rehab Allergies as of 03/07/2021       Reactions   Codeine Itching   Severe hiccups   Ace Inhibitors Other (See Comments)   REACTION: Cough   Amlodipine    LE swelling   Hydrocodone Hives, Itching   Hiccoughs   Percocet [oxycodone-acetaminophen]    Severe hiccups        Medication List     STOP taking these medications    chlorpheniramine-HYDROcodone 10-8 MG/5ML Suer Commonly known as: Tussionex Pennkinetic ER   ipratropium 0.06 % nasal spray Commonly known as: ATROVENT   oseltamivir 75 MG capsule Commonly known as: TAMIFLU       TAKE these medications    acetaminophen 500 MG tablet Commonly known as: TYLENOL Take 500-1,000 mg by mouth every 6 (six) hours as needed for moderate pain.   albuterol 108 (90 Base) MCG/ACT inhaler Commonly known as: VENTOLIN HFA Inhale 2 puffs into the lungs every 6 (six) hours as needed for wheezing or shortness of breath (Cough).   Ampicillin-Sulbactam 3 g in sodium chloride 0.9 % 100 mL Inject 3 g into the vein every 6 (six) hours.   aspirin EC 81 MG tablet Take 1 tablet (81 mg total) by mouth daily.   atorvastatin 80 MG tablet Commonly known as: LIPITOR TAKE 1  TABLET(80 MG) BY MOUTH DAILY AT 6 PM   benzonatate 100 MG capsule Commonly known as: TESSALON Take 1 capsule (100 mg total) by mouth 3 (three) times daily. What changed:  medication strength how much to take when to take this reasons to take this   cetirizine 10 MG tablet Commonly known as: ZYRTEC Take 1 tablet (10 mg total) by mouth daily.   dextromethorphan 30 MG/5ML liquid Commonly known as: DELSYM Take 5 mLs (30 mg total) by mouth 3 (three) times daily.   DULoxetine 30 MG capsule Commonly known as: CYMBALTA Take 30 mg by mouth daily.   ezetimibe 10 MG tablet Commonly known as: ZETIA Take 10 mg by mouth daily. What changed: Another medication with the same name was removed. Continue taking this medication, and follow the directions you see here.   fluticasone 50 MCG/ACT nasal spray Commonly known as: FLONASE Place 2 sprays into both nostrils daily.   guaiFENesin 600 MG 12 hr tablet Commonly known as: MUCINEX Take 1 tablet (600 mg total) by mouth 2 (two) times daily.   olmesartan 40 MG tablet Commonly known as: BENICAR Take 1 tablet (40 mg total) by mouth daily.   pantoprazole 40 MG tablet Commonly known as: PROTONIX TAKE 1 TABLET BY MOUTH TWICE DAILY   ticagrelor 90 MG Tabs tablet Commonly known as: BRILINTA Take 1  tablet (90 mg total) by mouth 2 (two) times daily.   vitamin D3 50 MCG (2000 UT) Caps Take 2,000 Units by mouth daily.        LABORATORY STUDIES CBC    Component Value Date/Time   WBC 16.3 (H) 03/21/2021 0140   RBC 3.65 (L) 03/27/2021 0140   HGB 10.6 (L) 03/12/2021 0140   HGB 13.6 07/31/2019 1613   HCT 32.4 (L) 03/04/2021 0140   HCT 41.6 07/31/2019 1613   PLT 264 03/20/2021 0140   PLT 220 07/31/2019 1613   MCV 88.8 03/14/2021 0140   MCV 93 07/31/2019 1613   MCH 29.0 03/13/2021 0140   MCHC 32.7 03/18/2021 0140   RDW 14.9 03/21/2021 0140   RDW 12.9 07/31/2019 1613   LYMPHSABS 1.3 03/21/2021 0140   MONOABS 1.0 03/03/2021 0140    EOSABS 0.1 03/14/2021 0140   BASOSABS 0.0 03/07/2021 0140   CMP    Component Value Date/Time   NA 139 03/29/2021 0140   NA 143 08/20/2020 1207   K 3.2 (L) 03/24/2021 0140   CL 111 03/11/2021 0140   CO2 20 (L) 03/10/2021 0140   GLUCOSE 119 (H) 03/26/2021 0140   BUN 23 03/02/2021 0140   BUN 22 08/20/2020 1207   CREATININE 0.99 03/08/2021 0140   CALCIUM 9.1 03/20/2021 0140   PROT 5.9 (L) 03/06/2021 0515   PROT 6.3 11/18/2020 0843   ALBUMIN 2.4 (L) 03/06/2021 0515   ALBUMIN 4.2 11/18/2020 0843   AST 44 (H) 03/06/2021 0515   ALT 44 03/06/2021 0515   ALKPHOS 98 03/06/2021 0515   BILITOT 1.1 03/06/2021 0515   BILITOT 0.5 11/18/2020 0843   GFRNONAA >60 03/16/2021 0140   GFRAA >60 08/06/2019 0611   COAGS Lab Results  Component Value Date   INR 1.2 03/05/2021   INR 1.1 02/26/2021   INR 1.1 (H) 05/19/2020   Lipid Panel    Component Value Date/Time   CHOL 77 03/06/2021 0515   CHOL 172 11/18/2020 0843   TRIG 78 03/06/2021 0515   HDL 15 (L) 03/06/2021 0515   HDL 48 11/18/2020 0843   CHOLHDL 5.1 03/06/2021 0515   VLDL 16 03/06/2021 0515   LDLCALC 46 03/06/2021 0515   LDLCALC 106 (H) 11/18/2020 0843   HgbA1C  Lab Results  Component Value Date   HGBA1C 6.3 (H) 03/06/2021   Urinalysis    Component Value Date/Time   COLORURINE YELLOW 05/19/2020 [place] 05/19/2020 0942   LABSPEC 1.025 05/19/2020 0942   PHURINE 5.5 05/19/2020 0942   GLUCOSEU NEGATIVE 05/19/2020 0942   HGBUR TRACE-INTACT (A) 05/19/2020 0942   BILIRUBINUR NEGATIVE 05/19/2020 0942   BILIRUBINUR n 06/07/2013 1338   KETONESUR NEGATIVE 05/19/2020 0942   PROTEINUR NEGATIVE 08/14/2017 0250   UROBILINOGEN 0.2 05/19/2020 0942   NITRITE NEGATIVE 05/19/2020 0942   LEUKOCYTESUR NEGATIVE 05/19/2020 0942   Urine Drug Screen No results found for: LABOPIA, COCAINSCRNUR, LABBENZ, AMPHETMU, THCU, LABBARB  Alcohol Level No results found for: [facility]   SIGNIFICANT DIAGNOSTIC STUDIES CT ANGIO HEAD NECK  W WO CM  Result Date: 03/05/2021 CLINICAL DATA:  Provided history: Neuro deficit, acute, stroke suspected. Left-sided weakness. EXAM: CT ANGIOGRAPHY HEAD AND NECK TECHNIQUE: Multidetector CT imaging of the head and neck was performed using the standard protocol during bolus administration of intravenous contrast. Multiplanar CT image reconstructions and MIPs were obtained to evaluate the vascular anatomy. Carotid stenosis measurements (when applicable) are obtained utilizing NASCET criteria, using the distal internal carotid diameter as the  denominator. CONTRAST:  70m OMNIPAQUE IOHEXOL 350 MG/ML SOLN COMPARISON:  Noncontrast head CT performed earlier today 03/05/2021. Thyroid ultrasound 02/27/2020. FINDINGS: CTA NECK FINDINGS Aortic arch: Standard aortic branching. Atherosclerotic plaque within the visualized aortic arch and proximal major branch vessels of the neck. Atherosclerotic plaque within the visualized aortic arch and proximal major branch vessels of the neck. No hemodynamically significant innominate or proximal subclavian artery stenosis. Right carotid system: CCA and ICA patent within the neck. Prominent irregular hypodensity within the proximal right ICA. A portion of this likely reflects soft plaque. However, the irregular portion is highly suspicious for superimposed thrombus/unstable plaque (for instance as seen on series 9, image 61). Resultant severe stenosis of the proximal ICA estimated to be 80% (series 7, image 327). Minimal calcified plaque is also present about the carotid bifurcation. Left carotid system: CCA and ICA patent within neck without stenosis. Minimal atherosclerotic plaque about the carotid bifurcation and within the proximal ICA. Vertebral arteries: Vertebral arteries patent within the neck. Nonstenotic plaque at the origin of both vessels. The left vertebral artery is dominant. Skeleton: Partially imaged thoracic dextrocurvature. Cervical and upper thoracic spondylosis.  Multilevel bridging ventral osteophytes within the imaged upper thoracic spine. No acute bony abnormality or aggressive osseous lesion. Other neck: No cervical lymphadenopathy. 3 cm exophytic nodule extending inferiorly from the right thyroid lobe in the right paratracheal region (for instance as seen on series 7, image 288). 8 mm nodule within the inferior right thyroid lobe. Upper chest: No consolidation within the imaged lung apices. Review of the MIP images confirms the above findings CTA HEAD FINDINGS Anterior circulation: The intracranial internal carotid arteries are patent. Atherosclerotic plaque within both vessels. Moderate stenosis of the paraclinoid right ICA. Mild stenosis of the paraclinoid left ICA. The M1 middle cerebral arteries are patent. No M2 proximal branch occlusion is identified. Atherosclerotic irregularity of the M2 and more distal MCA vessels, bilaterally. Most notably, there is an apparent moderate/severe stenosis within superior division mid M2 right MCA vessel (series 12, image 17). The anterior cerebral arteries are patent. Hypoplastic left A1 segment. Atherosclerotic irregularity of both anterior cerebral arteries. Most notably, there is a moderate stenosis within the A4 left ACA. No intracranial aneurysm is identified. Posterior circulation: The intracranial vertebral arteries are patent. The basilar artery is patent. The posterior cerebral arteries are patent. Posterior communicating arteries are diminutive or absent bilaterally. Venous sinuses: Within the limitations of contrast timing, no convincing thrombus. Anatomic variants: As described. Review of the MIP images confirms the above findings CTA neck impression #1 and CTA head impressions #1 and #3 called by telephone at the time of interpretation on 03/05/2021 at 2:42 pm to provider Dr. SQuinn Axe who verbally acknowledged these results. IMPRESSION: CTA neck: 1. Prominent irregular hypodensity within the proximal right ICA. A portion  of this hypodensity likely reflects soft plaque. However, the irregular portion is highly suspicious for superimposed thrombus (or unstable plaque). Resultant severe stenosis of the proximal right ICA estimated to be 80%. 2. The left common carotid, left internal carotid and bilateral vertebral arteries are patent within the neck without significant stenosis. Mild atherosclerotic plaque within these vessels, as described. 3. 3 cm exophytic nodule extending inferiorly from the right thyroid lobe. This nodule was not described on the prior thyroid ultrasound of 02/27/2020. A repeat thyroid ultrasound is recommended to further characterize this nodule. 4.  Aortic Atherosclerosis (ICD10-I70.0). CTA head: 1. No intracranial large vessel occlusion is identified. 2. Intracranial atherosclerotic disease with multifocal stenoses, most notably as  follows. 3. Moderate/severe stenosis within a superior division mid M2 right MCA vessel. 4. Moderate stenosis within the A4 left anterior cerebral artery. 5. Moderate stenosis of the paraclinoid right ICA. Electronically Signed   By: Kellie Simmering D.O.   On: 03/05/2021 15:10   DG Chest 2 View  Result Date: 02/26/2021 CLINICAL DATA:  Suspected sepsis. Congestion started 1 month ago. Cough. EXAM: CHEST - 2 VIEW COMPARISON:  02/25/2021 FINDINGS: Low lung volumes. Heart size is accentuated by technique and low lung volumes. Minimal bibasilar atelectasis. No consolidations. No evidence for pulmonary edema. IMPRESSION: Low lung volumes.  No evidence for acute  abnormality. Electronically Signed   By: Nolon Nations M.D.   On: 02/26/2021 13:52   DG Chest 2 View  Result Date: 02/26/2021 CLINICAL DATA:  Cough. EXAM: CHEST - 2 VIEW COMPARISON:  May 19, 2020. FINDINGS: The heart size and mediastinal contours are within normal limits. Both lungs are clear. Stable hiatal hernia. The visualized skeletal structures are unremarkable. IMPRESSION: No active cardiopulmonary disease.   Stable hiatal hernia. Electronically Signed   By: Marijo Conception M.D.   On: 02/26/2021 11:48   MR BRAIN WO CONTRAST  Result Date: 03/05/2021 CLINICAL DATA:  Left-sided weakness EXAM: MRI HEAD WITHOUT CONTRAST TECHNIQUE: Multiplanar, multiecho pulse sequences of the brain and surrounding structures were obtained without intravenous contrast. COMPARISON:  None. FINDINGS: Brain: Multifocal acute cortical ischemia in the anterior right MCA territory. Small focus of acute/subacute ischemia at the left caudate head. No acute or chronic hemorrhage. There is multifocal hyperintense T2-weighted signal within the white matter. Parenchymal volume and CSF spaces are normal. The midline structures are normal. Vascular: Major flow voids are preserved. Skull and upper cervical spine: Normal calvarium and skull base. Visualized upper cervical spine and soft tissues are normal. Sinuses/Orbits:No paranasal sinus fluid levels or advanced mucosal thickening. No mastoid or middle ear effusion. Normal orbits. IMPRESSION: 1. Multifocal acute/early subacute cortical ischemia in the anterior right MCA territory. No hemorrhage or mass effect. 2. Small focus of acute/early subacute ischemia at the left caudate head. Electronically Signed   By: Ulyses Jarred M.D.   On: 03/05/2021 21:47   IR INTRAVSC STENT CERV CAROTID W/EMB-PROT MOD SED  Result Date: 03/08/2021 INDICATION: 81 year old male with past medical history significant for coronary artery have anemia, hypertension, remote history of prostate cancer and renal oncocytoma. He presented to emergency department with left-sided weakness. NIHSS at presentation 8; baseline modified Rankin scale 1. His last known well was unclear, probably 10 a.m. on March 05, 2021. Head CT showed large acute territory infarct or hemorrhage. CT angiogram of the head and neck showed critical stenosis of the cervical right ICA with suspected intraluminal thrombus and no intracranial large vessel  occlusion. Given acute neurological symptoms with possible acutely thrombosed right carotid plaque, decision was made to proceed with an emergency right carotid stent. EXAM: ULTRASOUND-GUIDED VASCULAR ACCESS DIAGNOSTIC CEREBRAL ANGIOGRAM RIGHT CAROTID STENTING WITH CEREBRAL PROTECTION DEVICE COMPARISON:  CT/CT angiogram of the head and neck March 05, 2021. MEDICATIONS: Intravenous Cangrelor bolus and drip. ANESTHESIA/SEDATION: Procedure was performed under monitored anesthesia care (MAC). CONTRAST:  100 mL of Omnipaque 300 mg per FLUOROSCOPY TIME:  Fluoroscopy Time: 10 minutes 48 seconds (430 mGy). COMPLICATIONS: None immediate. TECHNIQUE: Informed written consent was obtained from the patient after a thorough discussion of the procedural risks, benefits and alternatives. All questions were addressed. Maximal Sterile Barrier Technique was utilized including caps, mask, sterile gowns, sterile gloves, sterile drape, hand hygiene and skin  antiseptic. A timeout was performed prior to the initiation of the procedure. The right groin was prepped and draped in the usual sterile fashion. The right groin area was infiltrated with lidocaine 1% at the level of the right common femoral artery, under ultrasound guidance. Using a micropuncture kit and the modified Seldinger technique, access was gained to the right common femoral artery and an 8 French sheath was placed. Real-time ultrasound guidance was utilized for vascular access including the acquisition of a permanent ultrasound image documenting patency of the accessed vessel. Under fluoroscopy, an 8 [place] Walrus balloon guide catheter was navigated over a 6 [place] Berenstein 2 catheter and a 0.035" Terumo Glidewire into the aortic arch. The catheter was placed into the right common carotid artery. The diagnostic catheter was removed. Right common carotid artery angiogram with right anterior oblique view of the neck was obtained. Then, angiograms with frontal and lateral  views of the were obtained. FINDINGS: 1. The right common femoral artery is patent, with adequate caliber for vascular access. 2. Prominent atherosclerotic changes at the right carotid bifurcation with ulcerated plaque as well as filling defect projecting in the vein, suspicious for acute thrombus. Findings result in approximately 85% stenosis of the right ICA. 3. Brisk contrast opacification of the right MCA and bilateral ACA vascular tree without evidence of intracranial arterial occlusion. PROCEDURE: A right common carotid artery angiogram was obtained with right anterior oblique view in utilized as roadmap. Then, a 2.5-4.8 mm Emboshield nav 6 cerebral protection device was navigated and deployed into the distal cervical segment of the right ICA. Then, a 6-8 x 40 mm XACT carotid stent was navigated into the right carotid bifurcation. The guiding catheter balloon was inflated and the catheter was connected to an aspiration pump. The stent was then deployed spanning the distal right common carotid artery and proximal cervical right internal carotid artery under constant aspiration. The guiding catheter balloon was deflated and aspiration was stopped. Clear blood return was noted from the guide catheter. The cerebral protection device was subsequently recaptured. Right common carotid artery angiograms with right anterior oblique view of the neck showed adequate stent position with resolution of stenosis. Angiograms with frontal, lateral and right anterior oblique views of the head showed no evidence of thromboembolic complication. Delayed angiograms with right anterior oblique view of the neck showed patent stent with no evidence of in stent left formation. The catheter was subsequently withdrawn. A right common femoral artery angiogram was obtained in right anterior oblique view of via sheath side port contrast injection. The puncture is at the level of the mid right common femoral artery which has normal caliber  with no evidence of significant atherosclerotic disease. The right common femoral artery sheath was exchanged over the wire for a Perclose pro style which was utilized for access closure. Immediate hemostasis was achieved. IMPRESSION: 1. Successful cervical right carotid stenting with cerebral protection device for treatment of acutely symptomatic right carotid severe stenosis. 2. No evidence of thromboembolic complication. PLAN: 1. Dual anti-platelet with aspirin and Brilinta. 2. Follow-up carotid duplex in 3 months. Electronically Signed   By: [name] M.D.   On: 03/08/2021 10:19   IR US Guide Vasc Access Right  Result Date: 03/08/2021 INDICATION: 81 year old male with past medical history significant for coronary artery have anemia, hypertension, remote history of prostate cancer and renal oncocytoma. He presented to emergency department with left-sided weakness. NIHSS at presentation 8; baseline modified Rankin scale 1. His last known well was unclear,  probably 10 a.m. on March 05, 2021. Head CT showed large acute territory infarct or hemorrhage. CT angiogram of the head and neck showed critical stenosis of the cervical right ICA with suspected intraluminal thrombus and no intracranial large vessel occlusion. Given acute neurological symptoms with possible acutely thrombosed right carotid plaque, decision was made to proceed with an emergency right carotid stent. EXAM: ULTRASOUND-GUIDED VASCULAR ACCESS DIAGNOSTIC CEREBRAL ANGIOGRAM RIGHT CAROTID STENTING WITH CEREBRAL PROTECTION DEVICE COMPARISON:  CT/CT angiogram of the head and neck March 05, 2021. MEDICATIONS: Intravenous Cangrelor bolus and drip. ANESTHESIA/SEDATION: Procedure was performed under monitored anesthesia care (MAC). CONTRAST:  100 mL of Omnipaque 300 mg per FLUOROSCOPY TIME:  Fluoroscopy Time: 10 minutes 48 seconds (430 mGy). COMPLICATIONS: None immediate. TECHNIQUE: Informed written consent was obtained from the  patient after a thorough discussion of the procedural risks, benefits and alternatives. All questions were addressed. Maximal Sterile Barrier Technique was utilized including caps, mask, sterile gowns, sterile gloves, sterile drape, hand hygiene and skin antiseptic. A timeout was performed prior to the initiation of the procedure. The right groin was prepped and draped in the usual sterile fashion. The right groin area was infiltrated with lidocaine 1% at the level of the right common femoral artery, under ultrasound guidance. Using a micropuncture kit and the modified Seldinger technique, access was gained to the right common femoral artery and an 8 French sheath was placed. Real-time ultrasound guidance was utilized for vascular access including the acquisition of a permanent ultrasound image documenting patency of the accessed vessel. Under fluoroscopy, an 8 [place] Walrus balloon guide catheter was navigated over a 6 [place] Berenstein 2 catheter and a 0.035" Terumo Glidewire into the aortic arch. The catheter was placed into the right common carotid artery. The diagnostic catheter was removed. Right common carotid artery angiogram with right anterior oblique view of the neck was obtained. Then, angiograms with frontal and lateral views of the were obtained. FINDINGS: 1. The right common femoral artery is patent, with adequate caliber for vascular access. 2. Prominent atherosclerotic changes at the right carotid bifurcation with ulcerated plaque as well as filling defect projecting in the vein, suspicious for acute thrombus. Findings result in approximately 85% stenosis of the right ICA. 3. Brisk contrast opacification of the right MCA and bilateral ACA vascular tree without evidence of intracranial arterial occlusion. PROCEDURE: A right common carotid artery angiogram was obtained with right anterior oblique view in utilized as roadmap. Then, a 2.5-4.8 mm Emboshield nav 6 cerebral protection device was navigated  and deployed into the distal cervical segment of the right ICA. Then, a 6-8 x 40 mm XACT carotid stent was navigated into the right carotid bifurcation. The guiding catheter balloon was inflated and the catheter was connected to an aspiration pump. The stent was then deployed spanning the distal right common carotid artery and proximal cervical right internal carotid artery under constant aspiration. The guiding catheter balloon was deflated and aspiration was stopped. Clear blood return was noted from the guide catheter. The cerebral protection device was subsequently recaptured. Right common carotid artery angiograms with right anterior oblique view of the neck showed adequate stent position with resolution of stenosis. Angiograms with frontal, lateral and right anterior oblique views of the head showed no evidence of thromboembolic complication. Delayed angiograms with right anterior oblique view of the neck showed patent stent with no evidence of in stent left formation. The catheter was subsequently withdrawn. A right common femoral artery angiogram was obtained in right anterior oblique view of  via sheath side port contrast injection. The puncture is at the level of the mid right common femoral artery which has normal caliber with no evidence of significant atherosclerotic disease. The right common femoral artery sheath was exchanged over the wire for a Perclose pro style which was utilized for access closure. Immediate hemostasis was achieved. IMPRESSION: 1. Successful cervical right carotid stenting with cerebral protection device for treatment of acutely symptomatic right carotid severe stenosis. 2. No evidence of thromboembolic complication. PLAN: 1. Dual anti-platelet with aspirin and Brilinta. 2. Follow-up carotid duplex in 3 months. Electronically Signed   By: [name] M.D.   On: 03/08/2021 10:19   DG CHEST PORT 1 VIEW  Result Date: 03/07/2021 CLINICAL DATA:  Respiratory  abnormalities. EXAM: PORTABLE CHEST 1 VIEW COMPARISON:  Radiograph 02/26/2021 FINDINGS: Lung volumes are low. Stable heart size and mediastinal contours. Patchy opacity in both lower lung zones. Mild additional patchy opacity in the right suprahilar lung. Vascular congestion. No pleural effusion. Anti lordotic positioning. IMPRESSION: 1. Low lung volumes with patchy bibasilar and right suprahilar opacities, may represent multifocal pneumonia, particularly at the right lung base. 2. Vascular congestion. Electronically Signed   By: Keith Rake M.D.   On: 03/07/2021 17:35   DG Swallowing Func-Speech Pathology  Result Date: 03/16/2021 Table formatting from the original result was not included. Objective Swallowing Evaluation: Type of Study: MBS-Modified Barium Swallow Study  Patient Details Name: DENZIL MCEACHRON MRN: [id number] Date of Birth: [birth year] Today's Date: 03/07/2021 Time: SLP Start Time (ACUTE ONLY): 0900 -SLP Stop Time (ACUTE ONLY): 0930 SLP Time Calculation (min) (ACUTE ONLY): 30 min Past Medical History: Past Medical History: Diagnosis Date  Allergy   Arthritis   gen.  and left hip  BPH (benign prostatic hypertrophy)   Coronary artery disease   GERD (gastroesophageal reflux disease)   occasional  History of kidney stones   2007  Hyperlipidemia   Hypertension   Kidney tumor   LBP (low back pain) 06/2005  L5 radicular symptoms; MRI of LS spine severe spondylosis at L4-L5 with central cancal stenosis   Nocturia   Organic impotence   Prostate cancer ([place]) 10/2011  (Low grade) [facility] - Dr. Junious Silk  Pyelonephritis 03/05/2013  Renal oncocytoma of right kidney 01/23/2013  As 3.2 x 3.5 cm enhancing exophytic mass projecting off the lower pole. This is consistent with a renal cell carcinoma. No retroperitoneal lymphadenopathy.   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  Wears glasses  Past Surgical History: Past Surgical History: Procedure Laterality Date  COLONOSCOPY  02/15/2006, 2014  Internal hemorrhoids  (Dr [name]), last in 2014  [place] Right 1980  IR ANGIO INTRA EXTRACRAN SEL INTERNAL CAROTID UNI R MOD SED  03/05/2021  IR INTRAVSC STENT CERV CAROTID W/EMB-PROT MOD SED INCL ANGIO  03/05/2021  IR US GUIDE VASC ACCESS RIGHT  03/05/2021  LYMPHADENECTOMY Bilateral 02/15/2013  Procedure: LYMPHADENECTOMY WITH INDOCYANINE GREEN DYE INJECTION;  Surgeon: [name], MD;  Location: [facility];  Service: Urology;  Laterality: Bilateral;  PROSTATE BIOPSY N/A 12/18/2012  Procedure: BIOPSY TRANSRECTAL ULTRASONIC PROSTATE (TUBP);  Surgeon: [name], MD;  Location: [facility];  Service: Urology;  Laterality: N/A;  PROSTATE BIOPSY  12/05/11  gleason 6, 3/12 cores  RADIOLOGY WITH ANESTHESIA N/A 03/05/2021  Procedure: IR WITH ANESTHESIA;  Surgeon: Radiologist, Medication, MD;  Location: [place];  Service: Radiology;  Laterality: N/A;  REPAIR UMBILICAL AND VENTRAL HERNIA'S W/ MESH  08-21-2009  RIGHT/LEFT HEART CATH AND CORONARY ANGIOGRAPHY  N/A 08/06/2019  Procedure: RIGHT/LEFT HEART CATH AND CORONARY ANGIOGRAPHY;  Surgeon: [name], Peter M, MD;  Location: [place] [facility];  Service: Cardiovascular;  Laterality: N/A;  ROBOT ASSISTED LAPAROSCOPIC RADICAL PROSTATECTOMY N/A 02/15/2013  Procedure: ROBOTIC ASSISTED LAPAROSCOPIC RADICAL PROSTATECTOMY;  Surgeon: [name], MD;  Location: [facility];  Service: Urology;  Laterality: N/A;  ROBOTIC ASSITED PARTIAL NEPHRECTOMY Right 02/15/2013  Procedure: ROBOTIC ASSITED PARTIAL NEPHRECTOMY;  Surgeon: [name], MD;  Location: [facility];  Service: Urology;  Laterality: Right;  SPERMATOCELECTOMY Left 12/18/2012  Procedure: LEFT SPERMATOCELECTOMY;  Surgeon: [name], MD;  Location: [facility];  Service: Urology;  Laterality: Left;  TONSILLECTOMY AND ADENOIDECTOMY  as child  TOTAL KNEE ARTHROPLASTY Left 05/26/2020  Procedure: TOTAL KNEE ARTHROPLASTY;  Surgeon: Paralee Cancel, MD;  Location: [facility];  Service: Orthopedics;   Laterality: Left;  70 mins  TRANSURETHRAL RESECTION OF PROSTATE  AGE 56 HPI: Pt is a 81 y/o male presented to ED on 03/05/21 for L sided weakness. Recent flue dx. CTA showed R ICA occlusion. S/p cerebral angiogram and R carotid stenting on 1/6. MRI showed multifocal acute/early subacute cortical ischemia in anterior R MCA and small focus of acute/early subacute ischemia at L caudate head. Pt evaluated by SLP for dysarthria, MD then ordered swallow eval due to persistent pna and complaint of coughing while eating. PMH: HTN, CAD, prostate cancer  No data recorded  Recommendations for follow up therapy are one component of a multi-disciplinary discharge planning process, led by the attending physician.  Recommendations may be updated based on patient status, additional functional criteria and insurance authorization. Assessment / Plan / Recommendation Clinical Impressions 03/04/2021 Clinical Impression  Pt demonstrates a moderate to severe pharyngeal dysphagia, likely acute on chronic in nature. Pt demonstrates structural changes in pharynx impacting function that are likely related to curvature of spinal column. Pt has initiation of swallow at pyriform sinuses, leading to instances of sensed aspiration of thin liquids before the swallow without expectoration (PAS 7). Pt additionally has moderate residue with liquid and solid boluses due to restricted bolus flow (decreased epiglottic deflection and UES opening) appearing secondary to curvature of cervical spine. SLP able to cue pt to complete a chin tuck which reduced pyriform residue and also complete preventative throat clears and second swallow to clear nectar thick penetrate. Pt fatigued significantly during exam and generalized weakness is increasing pts risk at this time. Question if a dys 3 (mech soft diet) and nectar thick liqudis will be tolerated despite efforts. Will attempt modified diet and strengthing interventions. Pt recommended to f/u with AIR. SLP Visit  Diagnosis Dysphagia, pharyngeal phase (R13.13) Attention and concentration deficit following -- Frontal lobe and executive function deficit following -- Impact on safety and function Moderate aspiration risk;Risk for inadequate nutrition/hydration   Treatment Recommendations 03/25/2021 Treatment Recommendations Therapy as outlined in treatment plan below   Prognosis 03/29/2021 Prognosis for Safe Diet Advancement Fair Barriers to Reach Goals -- Barriers/Prognosis Comment -- Diet Recommendations 03/08/2021 SLP Diet Recommendations Dysphagia 3 (Mech soft) solids;Nectar thick liquid Liquid Administration via Cup;Straw Medication Administration Crushed with puree Compensations Slow rate;Small sips/bites;Follow solids with liquid;Clear throat intermittently;Effortful swallow;Chin tuck;Multiple dry swallows after each bite/sip Postural Changes Remain semi-upright after after feeds/meals (Comment);Seated upright at 90 degrees   Other Recommendations 03/30/2021 Recommended Consults -- Oral Care Recommendations Oral care BID Other Recommendations Have oral suction available Follow Up Recommendations Acute inpatient rehab (3hours/day) Assistance recommended at discharge -- Functional Status Assessment Patient has had a recent decline  in their functional status and demonstrates the ability to make significant improvements in function in a reasonable and predictable amount of time. Frequency and Duration  03/15/2021 Speech Therapy Frequency (ACUTE ONLY) min 2x/week Treatment Duration 2 weeks   Oral Phase 03/02/2021 Oral Phase Impaired Oral - Pudding Teaspoon -- Oral - Pudding Cup -- Oral - Honey Teaspoon -- Oral - Honey Cup -- Oral - Nectar Teaspoon -- Oral - Nectar Cup Lingual/palatal residue Oral - Nectar Straw Lingual/palatal residue Oral - Thin Teaspoon Lingual/palatal residue Oral - Thin Cup Lingual/palatal residue Oral - Thin Straw Lingual/palatal residue Oral - Puree Lingual/palatal residue Oral - Mech Soft Lingual/palatal  residue Oral - Regular -- Oral - Multi-Consistency -- Oral - Pill -- Oral Phase - Comment --  Pharyngeal Phase 03/27/2021 Pharyngeal Phase Impaired Pharyngeal- Pudding Teaspoon -- Pharyngeal -- Pharyngeal- Pudding Cup -- Pharyngeal -- Pharyngeal- Honey Teaspoon -- Pharyngeal -- Pharyngeal- Honey Cup -- Pharyngeal -- Pharyngeal- Nectar Teaspoon -- Pharyngeal -- Pharyngeal- Nectar Cup Delayed swallow initiation-pyriform sinuses;Reduced epiglottic inversion;Penetration/Apiration after swallow;Pharyngeal residue - valleculae;Pharyngeal residue - pyriform;Compensatory strategies attempted (with notebox) Pharyngeal Material enters airway, CONTACTS cords and not ejected out;Material enters airway, remains ABOVE vocal cords and not ejected out;Material does not enter airway Pharyngeal- Nectar Straw Delayed swallow initiation-pyriform sinuses;Reduced epiglottic inversion;Penetration/Apiration after swallow;Pharyngeal residue - valleculae;Pharyngeal residue - pyriform;Compensatory strategies attempted (with notebox) Pharyngeal Material enters airway, CONTACTS cords and not ejected out;Material enters airway, remains ABOVE vocal cords and not ejected out;Material does not enter airway Pharyngeal- Thin Teaspoon Delayed swallow initiation-pyriform sinuses;Reduced epiglottic inversion;Penetration/Aspiration before swallow;Trace aspiration Pharyngeal Material enters airway, passes BELOW cords and not ejected out despite cough attempt by patient Pharyngeal- Thin Cup Delayed swallow initiation-pyriform sinuses;Reduced epiglottic inversion;Penetration/Apiration after swallow;Pharyngeal residue - valleculae;Pharyngeal residue - pyriform;Compensatory strategies attempted (with notebox) Pharyngeal Material enters airway, CONTACTS cords and not ejected out;Material enters airway, remains ABOVE vocal cords and not ejected out;Material does not enter airway Pharyngeal- Thin Straw Delayed swallow initiation-pyriform sinuses;Reduced  epiglottic inversion;Penetration/Apiration after swallow;Pharyngeal residue - valleculae;Pharyngeal residue - pyriform;Compensatory strategies attempted (with notebox) Pharyngeal Material enters airway, CONTACTS cords and not ejected out;Material enters airway, remains ABOVE vocal cords and not ejected out;Material does not enter airway Pharyngeal- Puree Delayed swallow initiation-pyriform sinuses;Reduced epiglottic inversion;Pharyngeal residue - valleculae;Pharyngeal residue - pyriform;Compensatory strategies attempted (with notebox) Pharyngeal -- Pharyngeal- Mechanical Soft Delayed swallow initiation-pyriform sinuses;Reduced epiglottic inversion;Pharyngeal residue - valleculae;Pharyngeal residue - pyriform;Compensatory strategies attempted (with notebox) Pharyngeal -- Pharyngeal- Regular -- Pharyngeal -- Pharyngeal- Multi-consistency -- Pharyngeal -- Pharyngeal- Pill -- Pharyngeal -- Pharyngeal Comment --  Cervical Esophageal Phase  03/26/2021 Cervical Esophageal Phase Impaired Pudding Teaspoon -- Pudding Cup -- Honey Teaspoon -- Honey Cup -- Nectar Teaspoon -- Nectar Cup Reduced cricopharyngeal relaxation;Esophageal backflow into cervical esophagus;Esophageal backflow into the pharynx Nectar Straw Reduced cricopharyngeal relaxation;Esophageal backflow into cervical esophagus;Esophageal backflow into the pharynx Thin Teaspoon -- Thin Cup -- Thin Straw -- Puree -- Mechanical Soft -- Regular -- Multi-consistency -- Pill -- Cervical Esophageal Comment see impression DeBlois, Katherene Ponto 03/24/2021, 10:07 AM                     ECHOCARDIOGRAM COMPLETE  Result Date: 03/06/2021    ECHOCARDIOGRAM REPORT   Patient Name:   SUHEYB RAUCCI Date of Exam: 03/06/2021 Medical Rec #:  [id number]         Height:       72.0 in Accession #:    [id number]        Weight:  210.1 lb Date of Birth:  1940/09/15          BSA:          2.176 m Patient Age:    30 years          BP:           143/66 mmHg Patient Gender: M                  HR:           93 bpm. Exam Location:  Inpatient Procedure: 2D Echo, Cardiac Doppler, Color Doppler and Intracardiac            Opacification Agent Indications:    Stroke  History:        Patient has prior history of Echocardiogram examinations, most                 recent 06/13/2019.  Sonographer:    Clayton Lefort RDCS (AE) Referring Phys: [id number] Derek Jack  Sonographer Comments: Technically difficult study due to poor echo windows and no subcostal window. Image acquisition challenging due to respiratory motion. Patient coughing throughout test. IMPRESSIONS  1. Left ventricular ejection fraction, by estimation, is 65 to 70%. The left ventricle has normal function. The left ventricle has no regional wall motion abnormalities. There is moderate left ventricular hypertrophy. Left ventricular diastolic parameters are indeterminate.  2. Right ventricular systolic function is normal. The right ventricular size is normal.  3. The mitral valve is normal in structure. No evidence of mitral valve regurgitation. No evidence of mitral stenosis.  4. The aortic valve is normal in structure. Aortic valve regurgitation is not visualized. No aortic stenosis is present.  5. The inferior vena cava is normal in size with greater than 50% respiratory variability, suggesting right atrial pressure of 3 mmHg. Comparison(s): Prior ascending aorta 42-43 mm. Unable to visual in current study. Conclusion(s)/Recommendation(s): No intracardiac source of embolism detected on this transthoracic study. Consider a transesophageal echocardiogram to exclude cardiac source of embolism if clinically indicated. FINDINGS  Left Ventricle: Left ventricular ejection fraction, by estimation, is 65 to 70%. The left ventricle has normal function. The left ventricle has no regional wall motion abnormalities. The left ventricular internal cavity size was normal in size. There is  moderate left ventricular hypertrophy. Left ventricular diastolic parameters  are indeterminate. Right Ventricle: The right ventricular size is normal. No increase in right ventricular wall thickness. Right ventricular systolic function is normal. Left Atrium: Left atrial size was normal in size. Right Atrium: Right atrial size was normal in size. Pericardium: There is no evidence of pericardial effusion. Mitral Valve: The mitral valve is normal in structure. No evidence of mitral valve regurgitation. No evidence of mitral valve stenosis. Tricuspid Valve: The tricuspid valve is normal in structure. Tricuspid valve regurgitation is not demonstrated. No evidence of tricuspid stenosis. Aortic Valve: The aortic valve is normal in structure. Aortic valve regurgitation is not visualized. No aortic stenosis is present. Aortic valve mean gradient measures 7.0 mmHg. Aortic valve peak gradient measures 14.1 mmHg. Aortic valve area, by VTI measures 3.49 cm. Pulmonic Valve: The pulmonic valve was normal in structure. Pulmonic valve regurgitation is not visualized. No evidence of pulmonic stenosis. Aorta: The aortic root is normal in size and structure. Venous: The inferior vena cava is normal in size with greater than 50% respiratory variability, suggesting right atrial pressure of 3 mmHg. IAS/Shunts: No atrial level shunt detected by color flow Doppler.  LEFT VENTRICLE PLAX 2D  LVIDd:         4.70 cm LVIDs:         2.90 cm LV PW:         1.60 cm LV IVS:        1.80 cm LVOT diam:     2.30 cm LV SV:         108 LV SV Index:   49 LVOT Area:     4.15 cm  RIGHT VENTRICLE RV S prime:     17.40 cm/s TAPSE (M-mode): 2.4 cm LEFT ATRIUM           Index LA diam:      1.50 cm 0.69 cm/m LA Vol (A2C): 36.2 ml 16.64 ml/m LA Vol (A4C): 35.1 ml 16.13 ml/m  AORTIC VALVE AV Area (Vmax):    3.14 cm AV Area (Vmean):   3.59 cm AV Area (VTI):     3.49 cm AV Vmax:           188.00 cm/s AV Vmean:          126.000 cm/s AV VTI:            0.308 m AV Peak Grad:      14.1 mmHg AV Mean Grad:      7.0 mmHg LVOT Vmax:          142.00 cm/s LVOT Vmean:        109.000 cm/s LVOT VTI:          0.259 m LVOT/AV VTI ratio: 0.84  AORTA Ao Asc diam: 3.70 cm MITRAL VALVE MV Area (PHT): 2.87 cm     SHUNTS MV Decel Time: 264 msec     Systemic VTI:  0.26 m MV E velocity: 92.40 cm/s   Systemic Diam: 2.30 cm MV A velocity: 111.00 cm/s MV E/A ratio:  0.83 Candee Furbish MD Electronically signed by Candee Furbish MD Signature Date/Time: 03/06/2021/2:18:39 PM    Final    US THYROID  Result Date: 03/08/2021 CLINICAL DATA:  Incidental on CT. EXAM: THYROID ULTRASOUND TECHNIQUE: Ultrasound examination of the thyroid gland and adjacent soft tissues was performed. COMPARISON:  03/05/2021, 02/27/2020 FINDINGS: Parenchymal Echotexture: Mildly heterogenous Isthmus: 0.6 cm, previously 1.3 cm Right lobe: 4.2 x 1.4 x 1.7 cm, previously 4.5 x 2.3 x 1.8 cm Left lobe: 3.9 x 1.8 x 1.6 cm, previously 4.8 x 1.8 x 2.1 cm _________________________________________________________ Estimated total number of nodules >/= 1 cm: 1 Number of spongiform nodules >/=  2 cm not described below (TR1): 0 Number of mixed cystic and solid nodules >/= 1.5 cm not described below ([place]): 0 _________________________________________________________ Nodule # 1: Prior biopsy: No Location: Right; Superior Maximum size: 0.7 cm; Other 2 dimensions: 0.6 x 0.5 cm, previously, 0.6 x 0.6 x 0.5 cm Composition: cannot determine (2) Echogenicity: hypoechoic (2) Shape: not taller-than-wide (0) Margins: ill-defined (0) Echogenic foci: peripheral calcifications (2) ACR TI-RADS total points: 6. ACR TI-RADS risk category:  TR4 (4-6 points). Significant change in size (>/= 20% in two dimensions and minimal increase of 2 mm): No Change in features: No Change in ACR TI-RADS risk category: No ACR TI-RADS recommendations: Given size (<0.9 cm) and appearance, this nodule does NOT meet TI-RADS criteria for biopsy or dedicated follow-up. _________________________________________________________ Nodule # 2: Prior biopsy: No  Location: Right; Inferior Maximum size: 1.1 cm; Other 2 dimensions: 1.0 x 0.9 cm, previously, 1.1 x 1.0 x 0.9 cm Composition: solid/almost completely solid (2) Echogenicity: hypoechoic (2) Shape: not taller-than-wide (0) Margins: smooth (0) Echogenic  foci: none (0) ACR TI-RADS total points: 4. ACR TI-RADS risk category:  TR4 (4-6 points). Significant change in size (>/= 20% in two dimensions and minimal increase of 2 mm): No Change in features: No Change in ACR TI-RADS risk category: No ACR TI-RADS recommendations: *Given size (>/= 1 - 1.4 cm) and appearance, a follow-up ultrasound in 1 year should be considered based on TI-RADS criteria. _________________________________________________________ No cervical lymphadenopathy identified. IMPRESSION: 1. The right paratracheal nodule described on recent CT head neck is not visualized sonographically. In retrospect, this CT finding is similar to 06/13/2013 comparison chest CT and appears extrathyroidal. 2. Unchanged appearance of right inferior solid thyroid nodule (labeled 2, 1.1 cm) which again meets criteria (TI-RADS category 4) for 1 year ultrasound surveillance. The above is in keeping with the ACR TI-RADS recommendations - J Am Coll Radiol 2017;14:587-595. Ruthann Cancer, MD [facility] Radiology Electronically Signed   By: Ruthann Cancer M.D.   On: 03/08/2021 08:19   CT HEAD CODE STROKE WO CONTRAST  Result Date: 03/05/2021 CLINICAL DATA:  Code stroke.  Left-sided weakness EXAM: CT HEAD WITHOUT CONTRAST TECHNIQUE: Contiguous axial images were obtained from the base of the skull through the vertex without intravenous contrast. COMPARISON:  None. FINDINGS: Brain: There is no evidence of acute intracranial hemorrhage, extra-axial fluid collection, or acute infarct. Parenchymal volume is normal. The ventricles are normal in size. There is no mass lesion. There is no midline shift. Vascular: No dense vessel is seen. Skull:  Normal. Negative for fracture or focal lesion. Sinuses/Orbits: There is layering fluid in the maxillary and sphenoid sinuses. Bilateral lens implants are in place. The globes and orbits are otherwise unremarkable. Other: None. ASPECTS [facility] Stroke Program Early CT Score) - Ganglionic level infarction (caudate, lentiform nuclei, internal capsule, insula, M1-M3 cortex): Seven - Supraganglionic infarction (M4-M6 cortex): 3 Total score (0-10 with 10 being normal): 10 IMPRESSION: 1. No acute intracranial pathology. 2. ASPECTS is 10 3. Layering fluid in the maxillary and sphenoid sinuses which can be seen with acute sinusitis in the correct clinical setting. These results were paged via AMION at the time of interpretation on 03/05/2021 at 2:29 pm to provider Dr Livia Snellen. Electronically Signed   By: Valetta Mole M.D.   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 He presented to emergency department with left-sided weakness. NIHSS at presentation 8; baseline modified Rankin scale 1. His last known well was unclear, probably 10 a.m. on March 05, 2021. Head CT showed large acute territory infarct or hemorrhage. CT angiogram of the head and neck showed critical stenosis of the cervical right ICA with suspected intraluminal thrombus and no intracranial large vessel occlusion. Given acute neurological symptoms with possible acutely thrombosed right carotid plaque, decision was made to proceed with an emergency right carotid stent. EXAM: ULTRASOUND-GUIDED VASCULAR ACCESS DIAGNOSTIC CEREBRAL ANGIOGRAM RIGHT CAROTID STENTING WITH CEREBRAL PROTECTION DEVICE COMPARISON:  CT/CT angiogram of the head and neck March 05, 2021. MEDICATIONS: Intravenous Cangrelor bolus and drip. ANESTHESIA/SEDATION:  Procedure was performed under monitored anesthesia care (MAC). CONTRAST:  100 mL of Omnipaque 300 mg per FLUOROSCOPY TIME:  Fluoroscopy Time: 10 minutes 48 seconds (430 mGy). COMPLICATIONS: None immediate. TECHNIQUE: Informed written consent was obtained from the patient after a thorough discussion of the procedural risks, benefits and  alternatives. All questions were addressed. Maximal Sterile Barrier Technique was utilized including caps, mask, sterile gowns, sterile gloves, sterile drape, hand hygiene and skin antiseptic. A timeout was performed prior to the initiation of the procedure. The right groin was prepped and draped in the usual sterile fashion. The right groin area was infiltrated with lidocaine 1% at the level of the right common femoral artery, under ultrasound guidance. Using a micropuncture kit and the modified Seldinger technique, access was gained to the right common femoral artery and an 8 French sheath was placed. Real-time ultrasound guidance was utilized for vascular access including the acquisition of a permanent ultrasound image documenting patency of the accessed vessel. Under fluoroscopy, an 8 [place] Walrus balloon guide catheter was navigated over a 6 [place] Berenstein 2 catheter and a 0.035" Terumo Glidewire into the aortic arch. The catheter was placed into the right common carotid artery. The diagnostic catheter was removed. Right common carotid artery angiogram with right anterior oblique view of the neck was obtained. Then, angiograms with frontal and lateral views of the were obtained. FINDINGS: 1. The right common femoral artery is patent, with adequate caliber for vascular access. 2. Prominent atherosclerotic changes at the right carotid bifurcation with ulcerated plaque as well as filling defect projecting in the vein, suspicious for acute thrombus. Findings result in approximately 85% stenosis of the right ICA. 3. Brisk contrast opacification of the right MCA and bilateral ACA  vascular tree without evidence of intracranial arterial occlusion. PROCEDURE: A right common carotid artery angiogram was obtained with right anterior oblique view in utilized as roadmap. Then, a 2.5-4.8 mm Emboshield nav 6 cerebral protection device was navigated and deployed into the distal cervical segment of the right ICA. Then, a 6-8 x 40 mm XACT carotid stent was navigated into the right carotid bifurcation. The guiding catheter balloon was inflated and the catheter was connected to an aspiration pump. The stent was then deployed spanning the distal right common carotid artery and proximal cervical right internal carotid artery under constant aspiration. The guiding catheter balloon was deflated and aspiration was stopped. Clear blood return was noted from the guide catheter. The cerebral protection device was subsequently recaptured. Right common carotid artery angiograms with right anterior oblique view of the neck showed adequate stent position with resolution of stenosis. Angiograms with frontal, lateral and right anterior oblique views of the head showed no evidence of thromboembolic complication. Delayed angiograms with right anterior oblique view of the neck showed patent stent with no evidence of in stent left formation. The catheter was subsequently withdrawn. A right common femoral artery angiogram was obtained in right anterior oblique view of via sheath side port contrast injection. The puncture is at the level of the mid right common femoral artery which has normal caliber with no evidence of significant atherosclerotic disease. The right common femoral artery sheath was exchanged over the wire for a Perclose pro style which was utilized for access closure. Immediate hemostasis was achieved. IMPRESSION: 1. Successful cervical right carotid stenting with cerebral protection device for treatment of acutely symptomatic right carotid severe stenosis. 2. No evidence of thromboembolic complication.  PLAN: 1. Dual anti-platelet with aspirin and Brilinta. 2. Follow-up carotid duplex in 3 months. Electronically Signed   By: [name] M.D.   On: 03/08/2021 10:19       HISTORY OF PRESENT ILLNESS This is a 81 year old man with a history of coronary artery disease, hyperlipidemia, hypertension, remote history of prostate cancer, renal oncocytoma of right  kidney in 2014 who presented to the ED as a pretty activated stroke alert for left-sided weakness.  Patient is highly functional without baseline cognitive deficits and ambulates independently without assistance.  He states that this morning sometime around 10 AM he noticed that his left hand was not working properly when he went to grip a napkin while making himself cereal.  He was not definitely sure about his last known well.  Upon arrival to the hospital it was 2:13 PM and TNKase was not administered due to patient being unable to definitely confirm that his sx did not start within the previous 4.5 hours. He did not seek medical attention; but when his wife returned home and noticed his deficits this afternoon she called 911. NIHSS on arrival 8 for L facial droop, LUE and LLE weakness, dysarthria, and L visual neglect. CT head NAICP, ASPECTS 10.   HOSPITAL COURSE Mr. JIAIRE ROSEBROOK is a 81 y.o. male with history of coronary artery disease, hyperlipidemia, hypertension, remote history of prostate cancer, renal oncocytoma of right kidney in 2014 presenting with left sided weakness, dysarthria and visual neglect.  He did not receive TNKase due to unknown time of onset but found to have right ICA critical stenosis s/p Interventional Radiology with right CAS.   Stroke: Multifocal acute/early subacute cortical ischemia in the anterior right MCA territory, likely large vessel source from critical R ICA stenosis s/p right ICA stenting. Left caudate small infarct could be procedure related vs. Occult afib CT Head - No acute intracranial  pathology. ASPECTS is 10.  CTA Neck - Prominent irregular hypodensity within the proximal right ICA. A portion of this hypodensity likely reflects soft plaque. However, the irregular portion is highly suspicious for superimposed thrombus (or unstable plaque). Resultant severe stenosis of the proximal right ICA estimated to be 80%.  CTA Head - Moderate/severe stenosis within a superior division mid M2 right MCA vessel. Moderate stenosis within the A4 left anterior cerebral artery. Moderate stenosis of the paraclinoid right ICA. IR with right ICA critical stenosis s/p CAS MRI head - Multifocal acute/early subacute cortical ischemia in the anterior right MCA territory. No hemorrhage or mass effect. Small focus of acute/early subacute ischemia at the left caudate head. 2D Echo -EF 65 to 70% Discussed with Dr. Stanford Breed,  Loop recorder placed to rule out afib Lacey Jensen Virus 2 - negative 02/26/21 LDL - 106 HgbA1c - 6.3 VTE prophylaxis - Lovenox 40 mg daily  aspirin 81 mg daily prior to admission, now on aspirin 81 mg daily and Brilinta (ticagrelor) 90 mg bid post stent Patient will be counseled to be compliant with his antithrombotic medications Ongoing aggressive stroke risk factor management Therapy recommendations:  CIR Disposition:  CIR today   R carotid stenosis CTA Neck - Prominent irregular hypodensity within the proximal right ICA. A portion of this hypodensity likely reflects soft plaque. However, the irregular portion is highly suspicious for superimposed thrombus (or unstable plaque). Resultant severe stenosis of the proximal right ICA estimated to be 80%.  Likely the cause of current stroke S/p emergent RICA stenting On ASA and brilinta   Flu Pneumonia ? aspiration Flu A positive 02/26/21 -> now negative on repeat CT head - Layering fluid in the maxillary and sphenoid sinuses which can be seen with acute sinusitis in the correct clinical setting. On mucinex and tessalon   Rrespiratory panel negative for flu and COVID Leukocytosis WBC 12.9-19.2->18.3->16.3 Appreciate help from hospitalist service.     Frequent PVCs Frequent PVCs on  tele MRI did show left CR small infarcts, not sure if related to procedure or cardioembolic Follows with Dr. Boris Lown as outpt, discussed with Dr. Stanford Breed, loop recorder placed on discharge.   Hypertension Home BP meds: Benicar 40 mg daily Stable BP < 180/105 Long-term BP goal normotensive   Hyperlipidemia Home Lipid lowering medication: Lipitor 80 mg daily / Zetia 10 mg daily LDL 106, goal < 70 Current lipid lowering medication: Lipitor 80 mg daily / Zetia 10 mg daily Continue statin at discharge   Dysphagia  Cough due to pneumonia but also with food Speech on board [facility] tomorrow   Other Stroke Risk Factors Advanced age Former cigarette smoker - quit ETOH use, advised to drink no more than 1 alcoholic beverage per day. Overweight, Body mass index is 28.49 kg/m., recommend weight loss, diet and exercise as appropriate  Coronary artery disease   Other Active Problems, Findings, Recommendations and/or Plan Code status - Full code CKD - stage 3a - creatinine - 1.20->1.26->0.99 Mild Leukocytosis - WBC's - 12.5 (afebrile) ->12.9 CTA Neck - 3 cm exophytic nodule extending inferiorly from the right thyroid lobe. This nodule was not described on the prior thyroid ultrasound of 02/27/2020. Thyroid ultrasound showed this nodule is likely extra-thyroidal Aortic Atherosclerosis (ICD10-I70.0)  DISCHARGE EXAM Blood pressure (!) 135/59, pulse 66, temperature 98.2 F (36.8 C), temperature source Oral, resp. rate 14, height 6' (1.829 m), weight 95 kg, SpO2 95 %.  General - Well nourished, well developed, in no apparent distress, mildly lethargic.   Ophthalmologic - fundi not visualized due to noncooperation.   Cardiovascular - Regular rhythm and rate, except with frequent PVCs on tele.   Neuro - awake, alert but  lethargic, eyes open, orientated to age, place, time and people. No aphasia, fluent language but paucity of speech, following all simple commands. Able to name and repeat. No gaze palsy, tracking bilaterally, visual field full, PERRL. Right mild facial droop. Tongue midline. RUE 4/5 proximal and distal, LUE pronator drift proximally and distal finger grip and finger movement 3/5. Bilaterally LEs 3/5 proximal, and 5/5 distally. Sensation symmetrical bilaterally, b/l FTN intact although slow on the left proportional to the weakness, gait not tested.   Discharge Diet      Diet   DIET DYS 3 Room service appropriate? Yes; Fluid consistency: Nectar Thick   liquids  DISCHARGE PLAN Disposition:  Transfer to [facility] for ongoing PT, OT and ST aspirin 81 mg daily and Brilinta (ticagrelor) 90 mg bid for secondary stroke prevention post stenting Recommend ongoing stroke risk factor control by Primary Care Physician at time of discharge from inpatient rehabilitation. Follow-up PCP Plotnikov, [name], MD in 2 weeks following discharge from rehab. Follow-up in [facility] in 4 weeks following discharge from rehab, office to schedule an appointment.   35 minutes were spent preparing discharge.  Rosalin Hawking, MD PhD Stroke Neurology 03/13/2021 1:49 PM

## 2021-03-10 ENCOUNTER — Encounter (HOSPITAL_COMMUNITY): Payer: Self-pay | Admitting: Physical Medicine & Rehabilitation

## 2021-03-10 ENCOUNTER — Inpatient Hospital Stay: Payer: Medicare Other | Admitting: Internal Medicine

## 2021-03-10 DIAGNOSIS — I63511 Cerebral infarction due to unspecified occlusion or stenosis of right middle cerebral artery: Secondary | ICD-10-CM | POA: Diagnosis not present

## 2021-03-10 LAB — URINALYSIS, ROUTINE W REFLEX MICROSCOPIC
Bilirubin Urine: NEGATIVE
Glucose, UA: NEGATIVE mg/dL
Hgb urine dipstick: NEGATIVE
Ketones, ur: NEGATIVE mg/dL
Leukocytes,Ua: NEGATIVE
Nitrite: NEGATIVE
Protein, ur: NEGATIVE mg/dL
Specific Gravity, Urine: 1.025 (ref 1.005–1.030)
pH: 6 (ref 5.0–8.0)

## 2021-03-10 LAB — BASIC METABOLIC PANEL
Anion gap: 11 (ref 5–15)
BUN: 21 mg/dL (ref 8–23)
CO2: 20 mmol/L — ABNORMAL LOW (ref 22–32)
Calcium: 9.7 mg/dL (ref 8.9–10.3)
Chloride: 110 mmol/L (ref 98–111)
Creatinine, Ser: 0.96 mg/dL (ref 0.61–1.24)
GFR, Estimated: >60 mL/min (ref >60)
Glucose, Bld: 111 mg/dL — ABNORMAL HIGH (ref 70–99)
Potassium: 3.6 mmol/L (ref 3.5–5.1)
Sodium: 141 mmol/L (ref 135–145)

## 2021-03-10 MED ORDER — GUAIFENESIN 100 MG/5ML PO LIQD
5.0000 mL | ORAL | Status: DC | PRN
  Administered 2021-03-23 – 2021-03-27 (×3): 5 mL via ORAL
  Filled 2021-03-10 (×4): qty 5

## 2021-03-10 MED ORDER — SODIUM CHLORIDE 0.45 % IV BOLUS
250.0000 mL | Freq: Once | INTRAVENOUS | Status: AC
Start: 2021-03-10 — End: 2021-03-10
  Administered 2021-03-10: 250 mL via INTRAVENOUS

## 2021-03-10 NOTE — Progress Notes (Addendum)
PROGRESS NOTE   Subjective/Complaints:  Bleeding from loop recorder last noc but this has subsided   ROS- neg CP, SOB, N/V/D  Objective:   EP PPM/ICD IMPLANT  Result Date: 03/18/2021 SURGEON:  Allegra Lai, MD   PREPROCEDURE DIAGNOSIS:  Cryptogenic Stroke   POSTPROCEDURE DIAGNOSIS:  Cryptogenic Stroke    PROCEDURES:  1. Implantable loop recorder implantation   INTRODUCTION:  GERTRUDE TARBET is a 81 y.o. male with a history of unexplained stroke who presents today for implantable loop implantation.  The patient has had a cryptogenic stroke.  Despite an extensive workup by neurology, no reversible causes have been identified.  he has worn telemetry during which he did not have arrhythmias.  There is significant concern for possible atrial fibrillation as the cause for the patients stroke.  The patient therefore presents today for implantable loop implantation.   DESCRIPTION OF PROCEDURE:  Informed written consent was obtained, and the patient was brought to the electrophysiology lab in a fasting state.  The patient required no sedation for the procedure today.  Mapping over the patient's chest was performed by the EP lab staff to identify the area where electrograms were most prominent for ILR recording.  This area was found to be the left parasternal region over the 3rd-4th intercostal space. The patients left chest was therefore prepped and draped in the usual sterile fashion by the EP lab staff. The skin overlying the left parasternal region was infiltrated with lidocaine for local analgesia.  A 0.5-cm incision was made over the left parasternal region over the 3rd intercostal space.  A subcutaneous ILR pocket was fashioned using a combination of sharp and blunt dissection.  A Medtronic Reveal Linq model [place] [id number] implantable loop recorder was then placed into the pocket  R waves were very prominent and measured 0.70mV. EBL<1 ml.   Steri- Strips and a sterile dressing were then applied.  There were no early apparent complications.   CONCLUSIONS:  1. Successful implantation of a Medtronic Reveal LINQ implantable loop recorder for cryptogenic stroke  2. No early apparent complications.   DG Swallowing Func-Speech Pathology  Result Date: 03/30/2021 Table formatting from the original result was not included. Objective Swallowing Evaluation: Type of Study: MBS-Modified Barium Swallow Study  Patient Details Name: THEOREN PALKA MRN: [id number] Date of Birth: [birth year] Today's Date: 03/26/2021 Time: SLP Start Time (ACUTE ONLY): 0900 -SLP Stop Time (ACUTE ONLY): 0930 SLP Time Calculation (min) (ACUTE ONLY): 30 min Past Medical History: Past Medical History: Diagnosis Date  Allergy   Arthritis   gen.  and left hip  BPH (benign prostatic hypertrophy)   Coronary artery disease   GERD (gastroesophageal reflux disease)   occasional  History of kidney stones   2007  Hyperlipidemia   Hypertension   Kidney tumor   LBP (low back pain) 06/2005  L5 radicular symptoms; MRI of LS spine severe spondylosis at L4-L5 with central cancal stenosis   Nocturia   Organic impotence   Prostate cancer ([place]) 10/2011  (Low grade) [facility] - Dr. Junious Silk  Pyelonephritis 03/05/2013  Renal oncocytoma of right kidney 01/23/2013  As 3.2 x 3.5 cm enhancing exophytic mass projecting off the  lower pole. This is consistent with a renal cell carcinoma. No retroperitoneal lymphadenopathy.   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  Wears glasses  Past Surgical History: Past Surgical History: Procedure Laterality Date  COLONOSCOPY  02/15/2006, 2014  Internal hemorrhoids (Dr [name]), last in 2014  [place] Right 1980  IR ANGIO INTRA EXTRACRAN SEL INTERNAL CAROTID UNI R MOD SED  03/05/2021  IR INTRAVSC STENT CERV CAROTID W/EMB-PROT MOD SED INCL ANGIO  03/05/2021  IR US GUIDE VASC ACCESS RIGHT  03/05/2021  LYMPHADENECTOMY Bilateral 02/15/2013  Procedure: LYMPHADENECTOMY WITH  INDOCYANINE GREEN DYE INJECTION;  Surgeon: [name], MD;  Location: [facility];  Service: Urology;  Laterality: Bilateral;  PROSTATE BIOPSY N/A 12/18/2012  Procedure: BIOPSY TRANSRECTAL ULTRASONIC PROSTATE (TUBP);  Surgeon: [name], MD;  Location: [facility];  Service: Urology;  Laterality: N/A;  PROSTATE BIOPSY  12/05/11  gleason 6, 3/12 cores  RADIOLOGY WITH ANESTHESIA N/A 03/05/2021  Procedure: IR WITH ANESTHESIA;  Surgeon: Radiologist, Medication, MD;  Location: [facility];  Service: Radiology;  Laterality: N/A;  REPAIR UMBILICAL AND VENTRAL HERNIA'S W/ MESH  08-21-2009  RIGHT/LEFT HEART CATH AND CORONARY ANGIOGRAPHY N/A 08/06/2019  Procedure: RIGHT/LEFT HEART CATH AND CORONARY ANGIOGRAPHY;  Surgeon: [name], Peter M, MD;  Location: [facility];  Service: Cardiovascular;  Laterality: N/A;  ROBOT ASSISTED LAPAROSCOPIC RADICAL PROSTATECTOMY N/A 02/15/2013  Procedure: ROBOTIC ASSISTED LAPAROSCOPIC RADICAL PROSTATECTOMY;  Surgeon: [name], MD;  Location: [facility];  Service: Urology;  Laterality: N/A;  ROBOTIC ASSITED PARTIAL NEPHRECTOMY Right 02/15/2013  Procedure: ROBOTIC ASSITED PARTIAL NEPHRECTOMY;  Surgeon: [name], MD;  Location: [facility];  Service: Urology;  Laterality: Right;  SPERMATOCELECTOMY Left 12/18/2012  Procedure: LEFT SPERMATOCELECTOMY;  Surgeon: [name], MD;  Location[facility];  Service: Urology;  Laterality: Left;  TONSILLECTOMY AND ADENOIDECTOMY  as child  TOTAL KNEE ARTHROPLASTY Left 05/26/2020  Procedure: TOTAL KNEE ARTHROPLASTY;  Surgeon: Paralee Cancel, MD;  Location: [facility];  Service: Orthopedics;  Laterality: Left;  70 mins  TRANSURETHRAL RESECTION OF PROSTATE  AGE 86 HPI: Pt is a 81 y/o male presented to ED on 03/05/21 for L sided weakness. Recent flue dx. CTA showed R ICA occlusion. S/p cerebral angiogram and R carotid stenting on 1/6. MRI showed multifocal acute/early subacute cortical ischemia in anterior R MCA and  small focus of acute/early subacute ischemia at L caudate head. Pt evaluated by SLP for dysarthria, MD then ordered swallow eval due to persistent pna and complaint of coughing while eating. PMH: HTN, CAD, prostate cancer  No data recorded  Recommendations for follow up therapy are one component of a multi-disciplinary discharge planning process, led by the attending physician.  Recommendations may be updated based on patient status, additional functional criteria and insurance authorization. Assessment / Plan / Recommendation Clinical Impressions 03/12/2021 Clinical Impression  Pt demonstrates a moderate to severe pharyngeal dysphagia, likely acute on chronic in nature. Pt demonstrates structural changes in pharynx impacting function that are likely related to curvature of spinal column. Pt has initiation of swallow at pyriform sinuses, leading to instances of sensed aspiration of thin liquids before the swallow without expectoration (PAS 7). Pt additionally has moderate residue with liquid and solid boluses due to restricted bolus flow (decreased epiglottic deflection and UES opening) appearing secondary to curvature of cervical spine. SLP able to cue pt to complete a chin tuck which reduced pyriform residue and also complete preventative throat clears and second swallow to clear nectar thick penetrate. Pt fatigued  significantly during exam and generalized weakness is increasing pts risk at this time. Question if a dys 3 (mech soft diet) and nectar thick liqudis will be tolerated despite efforts. Will attempt modified diet and strengthing interventions. Pt recommended to f/u with AIR. SLP Visit Diagnosis Dysphagia, pharyngeal phase (R13.13) Attention and concentration deficit following -- Frontal lobe and executive function deficit following -- Impact on safety and function Moderate aspiration risk;Risk for inadequate nutrition/hydration   Treatment Recommendations 03/21/2021 Treatment Recommendations Therapy as  outlined in treatment plan below   Prognosis 03/03/2021 Prognosis for Safe Diet Advancement Fair Barriers to Reach Goals -- Barriers/Prognosis Comment -- Diet Recommendations 03/10/2021 SLP Diet Recommendations Dysphagia 3 (Mech soft) solids;Nectar thick liquid Liquid Administration via Cup;Straw Medication Administration Crushed with puree Compensations Slow rate;Small sips/bites;Follow solids with liquid;Clear throat intermittently;Effortful swallow;Chin tuck;Multiple dry swallows after each bite/sip Postural Changes Remain semi-upright after after feeds/meals (Comment);Seated upright at 90 degrees   Other Recommendations 03/10/2021 Recommended Consults -- Oral Care Recommendations Oral care BID Other Recommendations Have oral suction available Follow Up Recommendations Acute inpatient rehab (3hours/day) Assistance recommended at discharge -- Functional Status Assessment Patient has had a recent decline in their functional status and demonstrates the ability to make significant improvements in function in a reasonable and predictable amount of time. Frequency and Duration  03/13/2021 Speech Therapy Frequency (ACUTE ONLY) min 2x/week Treatment Duration 2 weeks   Oral Phase 03/15/2021 Oral Phase Impaired Oral - Pudding Teaspoon -- Oral - Pudding Cup -- Oral - Honey Teaspoon -- Oral - Honey Cup -- Oral - Nectar Teaspoon -- Oral - Nectar Cup Lingual/palatal residue Oral - Nectar Straw Lingual/palatal residue Oral - Thin Teaspoon Lingual/palatal residue Oral - Thin Cup Lingual/palatal residue Oral - Thin Straw Lingual/palatal residue Oral - Puree Lingual/palatal residue Oral - Mech Soft Lingual/palatal residue Oral - Regular -- Oral - Multi-Consistency -- Oral - Pill -- Oral Phase - Comment --  Pharyngeal Phase 03/26/2021 Pharyngeal Phase Impaired Pharyngeal- Pudding Teaspoon -- Pharyngeal -- Pharyngeal- Pudding Cup -- Pharyngeal -- Pharyngeal- Honey Teaspoon -- Pharyngeal -- Pharyngeal- Honey Cup -- Pharyngeal --  Pharyngeal- Nectar Teaspoon -- Pharyngeal -- Pharyngeal- Nectar Cup Delayed swallow initiation-pyriform sinuses;Reduced epiglottic inversion;Penetration/Apiration after swallow;Pharyngeal residue - valleculae;Pharyngeal residue - pyriform;Compensatory strategies attempted (with notebox) Pharyngeal Material enters airway, CONTACTS cords and not ejected out;Material enters airway, remains ABOVE vocal cords and not ejected out;Material does not enter airway Pharyngeal- Nectar Straw Delayed swallow initiation-pyriform sinuses;Reduced epiglottic inversion;Penetration/Apiration after swallow;Pharyngeal residue - valleculae;Pharyngeal residue - pyriform;Compensatory strategies attempted (with notebox) Pharyngeal Material enters airway, CONTACTS cords and not ejected out;Material enters airway, remains ABOVE vocal cords and not ejected out;Material does not enter airway Pharyngeal- Thin Teaspoon Delayed swallow initiation-pyriform sinuses;Reduced epiglottic inversion;Penetration/Aspiration before swallow;Trace aspiration Pharyngeal Material enters airway, passes BELOW cords and not ejected out despite cough attempt by patient Pharyngeal- Thin Cup Delayed swallow initiation-pyriform sinuses;Reduced epiglottic inversion;Penetration/Apiration after swallow;Pharyngeal residue - valleculae;Pharyngeal residue - pyriform;Compensatory strategies attempted (with notebox) Pharyngeal Material enters airway, CONTACTS cords and not ejected out;Material enters airway, remains ABOVE vocal cords and not ejected out;Material does not enter airway Pharyngeal- Thin Straw Delayed swallow initiation-pyriform sinuses;Reduced epiglottic inversion;Penetration/Apiration after swallow;Pharyngeal residue - valleculae;Pharyngeal residue - pyriform;Compensatory strategies attempted (with notebox) Pharyngeal Material enters airway, CONTACTS cords and not ejected out;Material enters airway, remains ABOVE vocal cords and not ejected out;Material does not  enter airway Pharyngeal- Puree Delayed swallow initiation-pyriform sinuses;Reduced epiglottic inversion;Pharyngeal residue - valleculae;Pharyngeal residue - pyriform;Compensatory strategies attempted (with notebox) Pharyngeal -- Pharyngeal- Mechanical Soft Delayed swallow  initiation-pyriform sinuses;Reduced epiglottic inversion;Pharyngeal residue - valleculae;Pharyngeal residue - pyriform;Compensatory strategies attempted (with notebox) Pharyngeal -- Pharyngeal- Regular -- Pharyngeal -- Pharyngeal- Multi-consistency -- Pharyngeal -- Pharyngeal- Pill -- Pharyngeal -- Pharyngeal Comment --  Cervical Esophageal Phase  03/20/2021 Cervical Esophageal Phase Impaired Pudding Teaspoon -- Pudding Cup -- Honey Teaspoon -- Honey Cup -- Nectar Teaspoon -- Nectar Cup Reduced cricopharyngeal relaxation;Esophageal backflow into cervical esophagus;Esophageal backflow into the pharynx Nectar Straw Reduced cricopharyngeal relaxation;Esophageal backflow into cervical esophagus;Esophageal backflow into the pharynx Thin Teaspoon -- Thin Cup -- Thin Straw -- Puree -- Mechanical Soft -- Regular -- Multi-consistency -- Pill -- Cervical Esophageal Comment see impression Lynann Beaver 03/26/2021, 10:07 AM                     Recent Labs    03/08/21 0350 03/17/2021 0140  WBC 18.3* 16.3*  HGB 11.0* 10.6*  HCT 33.2* 32.4*  PLT 260 264   Recent Labs    03/08/21 0350 03/17/2021 0140  NA 139 139  K 3.3* 3.2*  CL 108 111  CO2 22 20*  GLUCOSE 115* 119*  BUN 21 23  CREATININE 1.08 0.99  CALCIUM 9.4 9.1    Intake/Output Summary (Last 24 hours) at 03/10/2021 0901 Last data filed at 03/10/2021 0600 Gross per 24 hour  Intake 557.97 ml  Output 600 ml  Net -42.03 ml        Physical Exam: Vital Signs Blood pressure (!) 141/64, pulse (!) 51, temperature 97.9 F (36.6 C), temperature source Oral, resp. rate 19, height 6' 0.01" (1.829 m), weight 92.1 kg, SpO2 96 %.   General: No acute distress Mood and affect are  appropriate Heart: Regular rate and rhythm no rubs murmurs or extra sounds Lungs: Clear to auscultation, breathing unlabored, no rales or wheezes Abdomen: Positive bowel sounds, soft nontender to palpation, nondistended Extremities: No clubbing, cyanosis, or edema Skin: No evidence of breakdown, no evidence of rash, loop site CDI, no hematoma palpated  Neurologic: Cranial nerves II through XII intact, motor strength is 4/5 in bilateral deltoid, bicep, tricep, grip, hip flexor, knee extensors, ankle dorsiflexor and plantar flexor  Musculoskeletal: Full range of motion in all 4 extremities. No joint swelling   Assessment/Plan: 1. Functional deficits which require 3+ hours per day of interdisciplinary therapy in a comprehensive inpatient rehab setting. Physiatrist is providing close team supervision and 24 hour management of active medical problems listed below. Physiatrist and rehab team continue to assess barriers to discharge/monitor patient progress toward functional and medical goals  Care Tool:  Bathing        Body parts bathed by helper: Right arm, Left arm, Chest, Buttocks, Right upper leg, Left upper leg, Right lower leg, Left lower leg, Face     Bathing assist       Upper Body Dressing/Undressing Upper body dressing   What is the patient wearing?: Hospital gown only    Upper body assist Assist Level: Moderate Assistance - Patient 50 - 74%    Lower Body Dressing/Undressing Lower body dressing      What is the patient wearing?: Incontinence brief     Lower body assist Assist for lower body dressing: Maximal Assistance - Patient 25 - 49%     Toileting Toileting    Toileting assist Assist for toileting: Maximal Assistance - Patient 25 - 49%     Transfers Chair/bed transfer  Transfers assist  Chair/bed transfer activity did not occur: Safety/medical concerns  Locomotion Ambulation   Ambulation assist   Ambulation activity did not occur:  Safety/medical concerns          Walk 10 feet activity   Assist           Walk 50 feet activity   Assist           Walk 150 feet activity   Assist           Walk 10 feet on uneven surface  activity   Assist           Wheelchair     Assist               Wheelchair 50 feet with 2 turns activity    Assist            Wheelchair 150 feet activity     Assist          Blood pressure (!) 141/64, pulse (!) 51, temperature 97.9 F (36.6 C), temperature source Oral, resp. rate 19, height 6' 0.01" (1.829 m), weight 92.1 kg, SpO2 96 %.  Medical Problem List and Plan: 1. Functional deficits secondary to multifocal acute/early subacute cortical ischemia in the anterior right MCA territory, likely large vessel source from critical right ICA stenosis.  Status post right ICA stenting.  Left caudate small infarct>> procedure related versus occult atrial fibrillation.             -patient may shower             -ELOS/Goals: 10-15 days, supervision to min assist with PT, OT, and SLP 2.  Antithrombotics: -DVT/anticoagulation:  Pharmaceutical: Lovenox             -antiplatelet therapy: Aspirin, Brilinta 3. Pain Management: Tylenol 4. Mood: LCSW to evaluate and provide emotional support             -antipsychotic agents: N/A 5. Neuropsych: This patient is capable of making decisions on his own behalf. 6. Skin/Wound Care: Routine skin care checks 7. Fluids/Electrolytes/Nutrition: Routine ins and outs and follow up chemistries 8.  Persistent cough with imaging evidence of patchy bibasilar and right suprahilar opacities             -- On Unasyn for suspected multifocal pneumonia; possible aspiration             --Continue pulmonary toilet, mucolytics, albuterol nebs prn             -persistent leukocytosis--but trending down             -currently afebrile 9.  Frequent PVCs: Sees Dr. Stanford Breed as an outpatient  --recommends loop recorder>>  placed 1/10 10: Hypertension: monitor off Benicar  Vitals:   03/10/21 0447 03/10/21 0811  BP: (!) 149/61 (!) 141/64  Pulse: 61 (!) 51  Resp:    Temp: (!) 97.5 F (36.4 C) 97.9 F (36.6 C)  SpO2: 96%   Controlled 1/11  11: Hyperlipidemia: Zetia and Lipitor 12: EtOH use: advised to drink no more than 1 alcoholic beverage dialy 13: Overweight: BMI = 28.5. Rec: weight loss, diet and exercise as appropriate 14: CKD stage 3a: serum Cr = 0.99. Monitor Poor intake , dislike dietary restrictions , will do 261ml fluid bolus today , recheck BMET  16: Hypokalemia: 1/10>>oral repletion, follow-up BMP in am 17: Anemia: asymptomatic --follow-up CBC and monitor 18: Thyroid nodule: follow-up ultrasound in one year recommended 19. Dysphagia: continue D3 with nectars, aspiration precautions             -  advance per SLP  20.  Lethargy , poor sleep last noc, poor intake todeay , on abx for PNA, , afeb, check UA, no sedating meds identified   LOS: 1 days A FACE TO FACE EVALUATION WAS PERFORMED  Charlett Blake 03/10/2021, 9:01 AM

## 2021-03-10 NOTE — Evaluation (Signed)
Physical Therapy Assessment and Plan  Patient Details  Name: BURHANUDDIN KOHLMANN MRN: [id number] Date of Birth: 27-Feb-1941  PT Diagnosis: Abnormal posture, Abnormality of gait, Cognitive deficits, Difficulty walking, Hemiparesis non-dominant, and Muscle weakness Rehab Potential: Good ELOS: ~ 2 weeks   Today's Date: 03/10/2021 PT Individual Time: 1107-1210 PT Individual Time Calculation (min): 63 min    Hospital Problem: Principal Problem:   Acute right MCA stroke [facility])   Past Medical History:  Past Medical History:  Diagnosis Date   Allergy    Arthritis    gen.  and left hip   BPH (benign prostatic hypertrophy)    Coronary artery disease    GERD (gastroesophageal reflux disease)    occasional   History of kidney stones    2007   Hyperlipidemia    Hypertension    Kidney tumor    LBP (low back pain) 06/2005   L5 radicular symptoms; MRI of LS spine severe spondylosis at L4-L5 with central cancal stenosis    Nocturia    Organic impotence    Prostate cancer ([place]) 10/2011   (Low grade) [facility] - Dr. Junious Silk   Pyelonephritis 03/05/2013   Renal oncocytoma of right kidney 01/23/2013   As 3.2 x 3.5 cm enhancing exophytic mass projecting off the lower pole. This is consistent with a renal cell carcinoma. No retroperitoneal lymphadenopathy.    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   Wears glasses    Past Surgical History:  Past Surgical History:  Procedure Laterality Date   COLONOSCOPY  02/15/2006, 2014   Internal hemorrhoids (Dr [name]), last in 2014   Christian Right 1980   IR ANGIO INTRA EXTRACRAN SEL INTERNAL CAROTID UNI R MOD SED  03/05/2021   IR INTRAVSC STENT CERV CAROTID W/EMB-PROT MOD SED INCL ANGIO  03/05/2021   IR US GUIDE VASC ACCESS RIGHT  03/05/2021   LOOP RECORDER INSERTION N/A 03/13/2021   Procedure: LOOP RECORDER INSERTION;  Surgeon: Constance Haw, MD;  Location: [place] [facility];  Service: Cardiovascular;  Laterality: N/A;   LYMPHADENECTOMY  Bilateral 02/15/2013   Procedure: LYMPHADENECTOMY WITH INDOCYANINE GREEN DYE INJECTION;  Surgeon: [name], MD;  Location: [facility];  Service: Urology;  Laterality: Bilateral;   PROSTATE BIOPSY N/A 12/18/2012   Procedure: BIOPSY TRANSRECTAL ULTRASONIC PROSTATE (TUBP);  Surgeon: [name], MD;  Location: [facility];  Service: Urology;  Laterality: N/A;   PROSTATE BIOPSY  12/05/11   gleason 6, 3/12 cores   RADIOLOGY WITH ANESTHESIA N/A 03/05/2021   Procedure: IR WITH ANESTHESIA;  Surgeon: Radiologist, Medication, MD;  Location: [place];  Service: Radiology;  Laterality: N/A;   REPAIR UMBILICAL AND VENTRAL HERNIA'S W/ MESH  08-21-2009   RIGHT/LEFT HEART CATH AND CORONARY ANGIOGRAPHY N/A 08/06/2019   Procedure: RIGHT/LEFT HEART CATH AND CORONARY ANGIOGRAPHY;  Surgeon: [name], Peter M, MD;  Location: [place] [facility];  Service: Cardiovascular;  Laterality: N/A;   ROBOT ASSISTED LAPAROSCOPIC RADICAL PROSTATECTOMY N/A 02/15/2013   Procedure: ROBOTIC ASSISTED LAPAROSCOPIC RADICAL PROSTATECTOMY;  Surgeon: [name], MD;  Location: [facility];  Service: Urology;  Laterality: N/A;   ROBOTIC ASSITED PARTIAL NEPHRECTOMY Right 02/15/2013   Procedure: ROBOTIC ASSITED PARTIAL NEPHRECTOMY;  Surgeon: [name], MD;  Location: [facility];  Service: Urology;  Laterality: Right;   SPERMATOCELECTOMY Left 12/18/2012   Procedure: LEFT SPERMATOCELECTOMY;  Surgeon: [name], MD;  Location: [facility];  Service: Urology;  Laterality: Left;   TONSILLECTOMY AND ADENOIDECTOMY  as child  TOTAL KNEE ARTHROPLASTY Left 05/26/2020   Procedure: TOTAL KNEE ARTHROPLASTY;  Surgeon: Paralee Cancel, MD;  Location: [facility];  Service: Orthopedics;  Laterality: Left;  70 mins   TRANSURETHRAL RESECTION OF PROSTATE  AGE 14    Assessment & Plan Clinical Impression: Patient is an 81 y.o. year old male with a history of coronary artery disease, hyperlipidemia, hypertension, remote  history of prostate cancer, renal oncocytoma of the right kidney who presented with left-sided weakness, dysarthria and visual neglect.  This occurred at approximately 10 AM on the morning of March 05, 2021.  He was on the toilet when he developed left arm and left leg weakness and his wife observed that he was unable to stand.  She called emergency medical services and he was transferred ported to the [facility] emergency department.  Code stroke was initiated he was outside the window for thrombolytics and his CT/CTA of the head and neck revealed right ICA occlusion.  Code IR activated.  He underwent diagnostic cerebral angiogram with right carotid stenting with cerebral protective device by Dr. Karenann Cai.  Findings revealed atherosclerotic changes of the right carotid bifurcation with associated thrombus at approximately 80 to 90% stenosis in the right carotid bulb.  A 6 to 8 x 40 mm XACT carotid stent was utilized.  Ticagrelor 90 mg twice daily and aspirin 81 mg daily initiated.  MRI of the brain showed right frontal cortical small infarcts and left CR small infarct.  Telemetry showed frequent PVCs but no atrial fibrillation.  Dr. Stanford Breed recommended loop recorder to rule out atrial fibrillation.   He has developed persistent cough with worsening leukocytosis.  Family states he has had persistent cough for approximately 2 to 3 weeks prior to admission and he completed an outpatient course of Augmentin and steroids.  He also tested positive for influenza A on 02/26/2021 and completed a course of Tamiflu.  COVID and flu swabs were performed on 03/06/2021 and were negative.  Chest x-ray on 03/07/2021 shows bilateral infiltrates concerning for multifocal pneumonia.  He has been afebrile with normal SaO2 on 2 L of oxygen.  Unasyn was started.  SLP reevaluation obtained and modified barium swallow performed on 03/17/2021.  Findings were moderate to severe pharyngeal dysphagia likely acute on chronic in  nature.  Recommended dysphagia 3 diet with nectar thick liquids.  In addition, IV fluids were discontinued and he was given Lasix x1 dose. Patient transferred to CIR on 03/11/2021 .   Patient currently requires mod assist with mobility secondary to muscle weakness, decreased cardiorespiratoy endurance and decreased oxygen support, impaired timing and sequencing and unbalanced muscle activation, decreased midline orientation and decreased attention to left, decreased awareness, decreased problem solving, decreased safety awareness, and decreased memory, and decreased sitting balance, decreased standing balance, decreased postural control, hemiplegia, and decreased balance strategies.  Prior to hospitalization, patient was independent  with mobility and lived with Spouse (wife, Vickii Chafe) in a House home.  Home access is 2Stairs to enter.  Patient will benefit from skilled PT intervention to maximize safe functional mobility, minimize fall risk, and decrease caregiver burden for planned discharge home with 24 hour supervision.  Anticipate patient will benefit from follow up OP at discharge.  PT - End of Session Activity Tolerance: Tolerates 30+ min activity with multiple rests Endurance Deficit: Yes Endurance Deficit Description: requires frequent seated rest breaks due to SOB PT Assessment Rehab Potential (ACUTE/IP ONLY): Good PT Barriers to Discharge: New oxygen PT Patient demonstrates impairments in the following area(s):  Balance;Perception;Behavior;Safety;Edema;Sensory;Endurance;Skin Integrity;Motor;Nutrition;Pain PT Transfers Functional Problem(s): Bed Mobility;Bed to Chair;Car;Furniture PT Locomotion Functional Problem(s): Ambulation;Wheelchair Mobility;Stairs PT Plan PT Intensity: Minimum of 1-2 x/day ,45 to 90 minutes PT Frequency: 5 out of 7 days PT Duration Estimated Length of Stay: ~ 2 weeks PT Treatment/Interventions: Ambulation/gait training;Community reintegration;DME/adaptive equipment  instruction;Neuromuscular re-education;Psychosocial support;Stair training;UE/LE Strength taining/ROM;Wheelchair propulsion/positioning;Balance/vestibular training;Discharge planning;Functional electrical stimulation;Pain management;Skin care/wound management;Therapeutic Activities;UE/LE Coordination activities;Cognitive remediation/compensation;Disease management/prevention;Functional mobility training;Patient/family education;Splinting/orthotics;Therapeutic Exercise;Visual/perceptual remediation/compensation PT Transfers Anticipated Outcome(s): supervision using LRAD PT Locomotion Anticipated Outcome(s): supervision using LRAD PT Recommendation Follow Up Recommendations: Outpatient PT;24 hour supervision/assistance Patient destination: Home Equipment Recommended: To be determined   PT Evaluation Precautions/Restrictions  Precautions Precautions: Fall;Other (comment) Precaution Comments: L hemiparesis Restrictions Weight Bearing Restrictions: No Pain Pain Assessment Pain Score: 4  Faces Pain Scale: Hurts a little bit PAINAD (Pain Assessment in Advanced Dementia) Breathing: normal Negative Vocalization: none Facial Expression: smiling or inexpressive Body Language: relaxed Consolability: no need to console PAINAD Score: 0 Critical Care Pain Observation Tool (CPOT) Facial Expression: Relaxed, neutral Pain Interference Pain Interference Pain Effect on Sleep: 1. Rarely or not at all Pain Interference with Therapy Activities: 1. Rarely or not at all Pain Interference with Day-to-Day Activities: 1. 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 [place] Vision/Perception   Baseline Vision/History: 1 Wears glasses Ability to See in Adequate Light: 1 Impaired Patient Visual Report: No change from baseline Perception Perception: Impaired Inattention/Neglect: Impaired-to be further tested in functional context (possible L inattention noted, but will continue to assess) Praxis Praxis: Intact  Cognition  Overall Cognitive Status: Impaired/Different from baseline Arousal/Alertness: Awake/alert Orientation Level: Oriented to person;Oriented to place;Oriented to situation;Disoriented to time (states it is Jan 6th, but it is the 11th) Year: 2023 Month: January Day of Week: Incorrect (states it is "Thursday" (actually Wednesday)) Focused Attention: Appears intact Sustained Attention: Impaired Memory: Impaired Awareness: Impaired Safety/Judgment: Impaired  Sensation Sensation Light Touch: Appears Intact Hot/Cold: Not tested Proprioception: Appears Intact Stereognosis: Not tested Coordination Gross Motor Movements are Fluid and Coordinated: No Coordination and Movement Description: impaired due to very mild L hemiparesis as well as impaired balance Motor  Motor Motor: Other (comment);Hemiplegia Motor - Skilled Clinical Observations: impaired balance with very mild L hemiparesis   Trunk/Postural Assessment  Cervical Assessment Cervical Assessment: Exceptions to [facility] (forward head) Thoracic Assessment Thoracic Assessment: Exceptions to [facility] (rounded shoulders) Lumbar Assessment Lumbar Assessment: Exceptions to [facility] (posterior pelvic tilt in sitting) Postural Control Postural Control: Deficits on evaluation Postural Limitations: decreased  Balance Balance Balance  Assessed: Yes Static Sitting Balance Static Sitting - Balance Support: Feet supported Static Sitting - Level of Assistance: 5: Stand by assistance Dynamic Sitting Balance Dynamic Sitting - Balance Support: Feet supported Dynamic Sitting - Level of Assistance: Other (comment);4: Min assist (CGA) Static Standing Balance Static Standing - Balance Support: During functional activity;Left upper extremity supported Static Standing - Level of Assistance: 4: Min assist Dynamic Standing Balance Dynamic Standing - Balance Support: During functional activity;Left upper extremity supported Dynamic Standing - Level  of Assistance: 3: Mod assist Extremity Assessment      RLE Assessment RLE Assessment: Within Functional Limits Active Range of Motion (AROM) Comments: WFL General Strength Comments: Grossly 4+/5 to 5/5 throughout LLE Assessment LLE Assessment: Exceptions to [facility] Active Range of Motion (AROM) Comments: WFL LLE Strength Left Hip Flexion: 4/5 Left Knee Flexion: 4/5 Left Knee Extension: 4/5 Left Ankle Dorsiflexion: 4/5 Left Ankle Plantar Flexion: 4/5  Care Tool Care Tool Bed Mobility Roll left and right activity   Roll left and right assist level: Moderate Assistance - Patient 50 - 74%    Sit to lying activity   Sit to lying assist level: Moderate Assistance - Patient 50 - 74%    Lying to sitting on side of bed activity   Lying to sitting on side of bed assist level: the ability to move from lying on the back to sitting on the side of the bed with no back support.: Moderate Assistance - Patient 50 - 74%     Care Tool Transfers Sit to stand transfer   Sit to stand assist level: Moderate Assistance - Patient 50 - 74%    Chair/bed transfer   Chair/bed transfer assist level: Moderate Assistance - Patient 50 - 74%     Research officer, political party transfer activity did not occur: Safety/medical concerns        Care Tool Locomotion Ambulation   Assist level: 2  helpers (mod A of 1 and +2 O2 line management) Assistive device: Hand held assist (L HHA) Max distance: 34f  Walk 10 feet activity   Assist level: 2 helpers Assistive device: Hand held assist   Walk 50 feet with 2 turns activity Walk 50 feet with 2 turns activity did not occur: Safety/medical concerns      Walk 150 feet activity Walk 150 feet activity did not occur: Safety/medical concerns      Walk 10 feet on uneven surfaces activity Walk 10 feet on uneven surfaces activity did not occur: Safety/medical concerns      Stairs Stair activity did not occur: Safety/medical concerns        Walk up/down 1 step activity Walk up/down 1 step or curb (drop down) activity did not occur: Safety/medical concerns      Walk up/down 4 steps activity Walk up/down 4 steps activity did not occur: Safety/medical concerns      Walk up/down 12 steps activity Walk up/down 12 steps activity did not occur: Safety/medical concerns      Pick up small objects from floor   Pick up small object from the floor assist level: Total Assistance - Patient < 25%    Wheelchair Is the patient using a wheelchair?: Yes Type of Wheelchair: Manual   Wheelchair assist level: Dependent - Patient 0% Max wheelchair distance: 2019f Wheel 50 feet with 2 turns activity   Assist Level: Dependent - Patient 0%  Wheel 150 feet activity   Assist Level: Dependent - Patient 0%    Refer to Care Plan for Long Term Goals  SHORT TERM GOAL WEEK 1 PT Short Term Goal 1 (Week 1): Pt will perform supine<>sit with min assist PT Short Term Goal 2 (Week 1): Pt will perform sit<>stands using LRAD with CGA PT Short Term Goal 3 (Week 1): Pt will perform stand pivot transfers using LRAD with CGA PT Short Term Goal 4 (Week 1): Pt will ambulate at least 7548fsing LRAD with min assist of 1 PT  Short Term Goal 5 (Week 1): Pt will navigate 4 stairs using HRs with min assist of 1  Recommendations for other services: None   Skilled  Therapeutic Intervention Pt received supine in bed with his wife, Vickii Chafe, and son-in-law, Eduard Clos, present and pt agreeable to therapy session. Pt lethargic at beginning of session but increased alertness throughout. Evaluation completed (see details above) with patient education regarding purpose of PT evaluation, PT POC and goals, therapy schedule, weekly team meetings, and other CIR information including safety plan and fall risk safety. Pt performed the below functional mobility tasks with the specified levels of assistance. Pt received and maintained on 2L of O2 via nasal cannula throughout session with SpO2 90% or higher throughout for basic mobility tasks. During gait training required +2 assist for O2 line management - pt demos short, shuffled steps with narrow BOS and mild R lean - cuing throughout for improvement. At end of session, pt agreeable to remain sitting in w/c - left with needs in reach, seat belt alarm on, and nurse present.  Mobility Bed Mobility Bed Mobility: Supine to Sit;Sit to Supine Supine to Sit: Moderate Assistance - Patient 50-74% Sit to Supine: Moderate Assistance - Patient 50-74% Transfers Transfers: Sit to Stand;Stand to Sit;Stand Pivot Transfers Sit to Stand: Minimal Assistance - Patient > 75%;Moderate Assistance - Patient 50-74% Stand to Sit: Minimal Assistance - Patient > 75%;Moderate Assistance - Patient 50-74% Stand Pivot Transfers: Moderate Assistance - Patient 50 - 74% Stand Pivot Transfer Details: Verbal cues for sequencing;Verbal cues for technique;Verbal cues for precautions/safety;Tactile cues for sequencing;Tactile cues for initiation;Tactile cues for weight shifting;Tactile cues for placement;Manual facilitation for weight shifting Transfer (Assistive device): None Locomotion  Gait Ambulation: Yes Gait Assistance: Moderate Assistance - Patient 50-74%;2 Helpers (+2 for O2 line management) Gait Distance (Feet): 38 Feet Assistive device: 1 person hand  held assist (L HHA) Gait Assistance Details: Tactile cues for initiation;Tactile cues for sequencing;Tactile cues for weight shifting;Tactile cues for posture;Manual facilitation for weight shifting;Verbal cues for gait pattern;Verbal cues for technique;Verbal cues for sequencing;Verbal cues for precautions/safety Gait Gait: Yes Gait Pattern: Impaired Gait Pattern: Step-through pattern;Decreased step length - left;Decreased step length - right;Decreased hip/knee flexion - right;Decreased hip/knee flexion - left;Poor foot clearance - left;Poor foot clearance - right;Narrow base of support;Lateral trunk lean to right (slight step-through pattern with poor foot clearance and slight R lean) Stairs / Additional Locomotion Stairs: No Wheelchair Mobility Wheelchair Mobility: No   Discharge Criteria: Patient will be discharged from PT if patient refuses treatment 3 consecutive times without medical reason, if treatment goals not met, if there is a change in medical status, if patient makes no progress towards goals or if patient is discharged from hospital.  The above assessment, treatment plan, treatment alternatives and goals were discussed and mutually agreed upon: by patient and by family  Tawana Scale , PT, DPT, NCS, CSRS 03/10/2021, 7:51 AM

## 2021-03-10 NOTE — Plan of Care (Signed)
°  Problem: RH Balance Goal: LTG Patient will maintain dynamic sitting balance (PT) Description: LTG:  Patient will maintain dynamic sitting balance with assistance during mobility activities (PT) Flowsheets (Taken 03/10/2021 1312) LTG: Pt will maintain dynamic sitting balance during mobility activities with:: Supervision/Verbal cueing Goal: LTG Patient will maintain dynamic standing balance (PT) Description: LTG:  Patient will maintain dynamic standing balance with assistance during mobility activities (PT) Flowsheets (Taken 03/10/2021 1312) LTG: Pt will maintain dynamic standing balance during mobility activities with:: Contact Guard/Touching assist   Problem: Sit to Stand Goal: LTG:  Patient will perform sit to stand with assistance level (PT) Description: LTG:  Patient will perform sit to stand with assistance level (PT) Flowsheets (Taken 03/10/2021 1312) LTG: PT will perform sit to stand in preparation for functional mobility with assistance level: Supervision/Verbal cueing   Problem: RH Bed Mobility Goal: LTG Patient will perform bed mobility with assist (PT) Description: LTG: Patient will perform bed mobility with assistance, with/without cues (PT). Flowsheets (Taken 03/10/2021 1312) LTG: Pt will perform bed mobility with assistance level of: Supervision/Verbal cueing   Problem: RH Bed to Chair Transfers Goal: LTG Patient will perform bed/chair transfers w/assist (PT) Description: LTG: Patient will perform bed to chair transfers with assistance (PT). Flowsheets (Taken 03/10/2021 1312) LTG: Pt will perform Bed to Chair Transfers with assistance level: Supervision/Verbal cueing   Problem: RH Car Transfers Goal: LTG Patient will perform car transfers with assist (PT) Description: LTG: Patient will perform car transfers with assistance (PT). Flowsheets (Taken 03/10/2021 1312) LTG: Pt will perform car transfers with assist:: Supervision/Verbal cueing   Problem: RH Ambulation Goal: LTG  Patient will ambulate in controlled environment (PT) Description: LTG: Patient will ambulate in a controlled environment, # of feet with assistance (PT). Flowsheets (Taken 03/10/2021 1312) LTG: Pt will ambulate in controlled environ  assist needed:: Supervision/Verbal cueing LTG: Ambulation distance in controlled environment: 164ft using LRAD Goal: LTG Patient will ambulate in home environment (PT) Description: LTG: Patient will ambulate in home environment, # of feet with assistance (PT). Flowsheets (Taken 03/10/2021 1312) LTG: Pt will ambulate in home environ  assist needed:: Supervision/Verbal cueing LTG: Ambulation distance in home environment: 76ft using LRAD   Problem: RH Stairs Goal: LTG Patient will ambulate up and down stairs w/assist (PT) Description: LTG: Patient will ambulate up and down # of stairs with assistance (PT) Flowsheets (Taken 03/10/2021 1312) LTG: Pt will ambulate up/down stairs assist needed:: Contact Guard/Touching assist LTG: Pt will  ambulate up and down number of stairs: 4 stairs with handrails per home set-up

## 2021-03-10 NOTE — Patient Care Conference (Signed)
Inpatient RehabilitationTeam Conference and Plan of Care Update Date: 03/10/2021   Time: 11:00 AM    Patient Name: David [name]      Medical Record Number: [id number]  Date of Birth: 01-27-1941 Sex: Male         Room/Bed: 5C02C/5C02C-01 Payor Info: Payor: Marine scientist / Plan: [employer] MEDICARE / Product Type: *No Product type* /    Admit Date/Time:  03/14/2021  5:06 PM  Primary Diagnosis:  Acute right MCA stroke [facility])  Hospital Problems: Principal Problem:   Acute right MCA stroke [facility])    Expected Discharge Date: Expected Discharge Date:  (2 week ELOS, evals pending)  Team Members Present: Physician leading conference: Dr. Alysia Penna Social Worker Present: Erlene Quan, [place] Nurse Present: Dorien Chihuahua, RN PT Present: Page Spiro, PT OT Present: Clyda Greener, OT SLP Present: Sherren Kerns, SLP PPS Coordinator present : Gunnar Fusi, SLP     Current Status/Progress Goal Weekly Team Focus  Bowel/Bladder   Incontinent to bowel and bladder  gain continentence Toileting every 2 hours  Offer PRN laxatives as needed for constipation   Swallow/Nutrition/ Hydration   Eval pending         ADL's             Mobility   eval pending         Communication   Eval pending         Safety/Cognition/ Behavioral Observations  Patient has intermittent confusion and needs redirection for safety  Remain safe and free from falls or  injury  eval pending   Pain   Pt denies any pain  Pt remains pain free  Assess pain every shift and as needed   Skin   R groin stent insertion site open to air and healed, L chest dressing for Loop recorder Clean, dry, and intact. Red raised rash to back and brusing on abdomen.  Remain Clean dry and intact and free from infection  Assess every shift along with other skin     Discharge Planning:      Team Discussion: Patient admitted post bilateral CVAs, recent loop recorder placement for ? A-fib. Hx of CAD, prostate cancer  and WBC remains elevated post treatment for influenza A, and bacterial pna. Patient's cough addressed and rash on back treated. Patient had bleeding post loop insertion, little sleep last p.m. and on IVFs. Using oxygen at 2L/Min [place] and although slow to wake, lethargy improved with activity and able to participate in therapy sessions. Progress limited by fatigue, motor planning deficits, decreased visual field tracking and poor attention.  Patient on target to meet rehab goals: yes, currently needed min assist for upper body care and mod assist for lower body dressing. Completed transfers with min - mod assist with left UE impairment. Goals for discharge set for supervision level overall.  *See Care Plan and progress notes for long and short-term goals.   Revisions to Treatment Plan:  N/A   Teaching Needs: Safety, transfers, toileting, medication management, secondary risk management, skin care, etc.  Current Barriers to Discharge: Decreased caregiver support  Possible Resolutions to Barriers: Family education     Medical Summary Current Status: lethargic with poor intake, elevated WBCs on IV abx for PNA, poor activity tolerance  Barriers to Discharge: Medical stability;Incontinence   Possible Resolutions to [employer] Focus: workup of lethargy, IVF for low fluid intake, monitor K+   Continued Need for Acute Rehabilitation Level of Care: The patient requires daily medical management by a physician  with specialized training in physical medicine and rehabilitation for the following reasons: Direction of a multidisciplinary physical rehabilitation program to maximize functional independence : Yes Medical management of patient stability for increased activity during participation in an intensive rehabilitation regime.: Yes Analysis of laboratory values and/or radiology reports with any subsequent need for medication adjustment and/or medical intervention. : Yes   I attest that I was  present, lead the team conference, and concur with the assessment and plan of the team.   Margarito Liner 03/10/2021, 3:21 PM

## 2021-03-10 NOTE — Progress Notes (Signed)
Physical Therapy Session Note  Patient Details  Name: David [name] MRN: [id number] Date of Birth: [birth year]  Today's Date: 03/10/2021 PT Individual Time: 1401-1415 PT Individual Time Calculation (min): 14 min  and Today's Date: 03/10/2021 PT Missed Time: 31 Minutes Missed Time Reason: Patient fatigue  Short Term Goals: No short term goals set  Skilled Therapeutic Interventions/Progress Updates:  Patient supine in bed, asleep, very difficult to wake. PT attempting to providing multimodal techniques to rouse patient, including cool wash cloth, gentle sternal rub and attempting to begin mobility with patient. Patient remaining asleep. Patient in bed, bed alarm on, call light within reach, RN aware of patients tolerance to therapy.   Therapy Documentation Precautions:  Precautions Precautions: Fall, Other (comment) Precaution Comments: L hemiparesis Restrictions Weight Bearing Restrictions: No     Therapy/Group: Individual Therapy  Karoline Caldwell, PT, DPT, CBIS  03/10/2021, 3:25 PM

## 2021-03-10 NOTE — Evaluation (Signed)
Speech Language Pathology Assessment and Plan  Patient Details  Name: David [name] MRN: [id number] Date of Birth: 1940/04/14  SLP Diagnosis: Dysarthria;Cognitive Impairments;Dysphagia  Rehab Potential: Good ELOS: ~2 weeks   Today's Date: 03/10/2021 SLP Individual Time: 1300-1400 SLP Individual Time Calculation (min): 60 min  Hospital Problem: Principal Problem:   Acute right MCA stroke [facility])  Past Medical History:  Past Medical History:  Diagnosis Date   Allergy    Arthritis    gen.  and left hip   BPH (benign prostatic hypertrophy)    Coronary artery disease    GERD (gastroesophageal reflux disease)    occasional   History of kidney stones    2007   Hyperlipidemia    Hypertension    Kidney tumor    LBP (low back pain) 06/2005   L5 radicular symptoms; MRI of LS spine severe spondylosis at L4-L5 with central cancal stenosis    Nocturia    Organic impotence    Prostate cancer ([place]) 10/2011   (Low grade) [facility] - Dr. Junious [name]   Pyelonephritis 03/05/2013   Renal oncocytoma of right kidney 01/23/2013   As 3.2 x 3.5 cm enhancing exophytic mass projecting off the lower pole. This is consistent with a renal cell carcinoma. No retroperitoneal lymphadenopathy.    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   Wears glasses    Past Surgical History:  Past Surgical History:  Procedure Laterality Date   COLONOSCOPY  02/15/2006, 2014   Internal hemorrhoids (Dr David [name]), last in 2014   [place] Right 1980   IR ANGIO INTRA EXTRACRAN SEL INTERNAL CAROTID UNI R MOD SED  03/05/2021   IR INTRAVSC STENT CERV CAROTID W/EMB-PROT MOD SED INCL ANGIO  03/05/2021   IR US GUIDE VASC ACCESS RIGHT  03/05/2021   LOOP RECORDER INSERTION N/A 03/15/2021   Procedure: LOOP RECORDER INSERTION;  Surgeon: David Haw, MD;  Location: [place] [facility];  Service: Cardiovascular;  Laterality: N/A;   LYMPHADENECTOMY Bilateral 02/15/2013   Procedure: LYMPHADENECTOMY WITH INDOCYANINE GREEN  DYE INJECTION;  Surgeon: David Frock, MD;  Location: [facility];  Service: Urology;  Laterality: Bilateral;   PROSTATE BIOPSY N/A 12/18/2012   Procedure: BIOPSY TRANSRECTAL ULTRASONIC PROSTATE (TUBP);  Surgeon: David Bonine, MD;  Location: [facility];  Service: Urology;  Laterality: N/A;   PROSTATE BIOPSY  12/05/11   gleason 6, 3/12 cores   RADIOLOGY WITH ANESTHESIA N/A 03/05/2021   Procedure: IR WITH ANESTHESIA;  Surgeon: David [name], Medication, MD;  Location: [place];  Service: Radiology;  Laterality: N/A;   REPAIR UMBILICAL AND VENTRAL HERNIA'S W/ MESH  08-21-2009   RIGHT/LEFT HEART CATH AND CORONARY ANGIOGRAPHY N/A 08/06/2019   Procedure: RIGHT/LEFT HEART CATH AND CORONARY ANGIOGRAPHY;  Surgeon: [name], Peter M, MD;  Location: [place] [facility];  Service: Cardiovascular;  Laterality: N/A;   ROBOT ASSISTED LAPAROSCOPIC RADICAL PROSTATECTOMY N/A 02/15/2013   Procedure: ROBOTIC ASSISTED LAPAROSCOPIC RADICAL PROSTATECTOMY;  Surgeon: David Frock, MD;  Location: [facility];  Service: Urology;  Laterality: N/A;   ROBOTIC ASSITED PARTIAL NEPHRECTOMY Right 02/15/2013   Procedure: ROBOTIC ASSITED PARTIAL NEPHRECTOMY;  Surgeon: David Frock, MD;  Location: [facility];  Service: Urology;  Laterality: Right;   SPERMATOCELECTOMY Left 12/18/2012   Procedure: LEFT SPERMATOCELECTOMY;  Surgeon: David Bonine, MD;  [facility];  Service: Urology;  Laterality: Left;   TONSILLECTOMY AND ADENOIDECTOMY  as child   TOTAL KNEE ARTHROPLASTY Left 05/26/2020   Procedure: TOTAL KNEE ARTHROPLASTY;  Surgeon: David Cancel, MD;  Location: [facility];  Service: Orthopedics;  Laterality: Left;  70 mins   TRANSURETHRAL RESECTION OF PROSTATE  AGE 32    Assessment / Plan / Recommendation Clinical Impression [name] is a right-handed 81 year old male with a history of coronary artery disease, hyperlipidemia, hypertension, remote history of prostate cancer, renal  oncocytoma of the right kidney who presented with left-sided weakness, dysarthria and visual neglect.  his occurred at approximately 10 AM on the morning of March 05, 2021.  He was on the toilet when he developed left arm and left leg weakness and his wife observed that he was unable to stand.  She called emergency medical services and he was transferred ported to the [facility] emergency department.  Code stroke was initiated he was outside the window for thrombolytics and his CT/CTA of the head and neck revealed right ICA occlusion.  He has developed persistent cough with worsening leukocytosis.  Family states he has had persistent cough for approximately 2 to 3 weeks prior to admission and he completed an outpatient course of Augmentin and steroids.  He also tested positive for influenza A on 02/26/2021 and completed a course of Tamiflu.  COVID and flu swabs were performed on 03/06/2021 and were negative. Chest x-ray on 03/07/2021 shows bilateral infiltrates concerning for multifocal pneumonia. SLP reevaluation obtained and modified barium swallow performed on 03/15/2021. Findings were moderate to severe pharyngeal dysphagia likely acute on chronic in nature. Recommended dysphagia 3 diet with nectar thick liquids.  Patient demonstrates cognitive impairments characterized by decreased attention, functional problem solving, and recall of functional information. Further assessment recommended for higher level cognitive skills. Pt with intermittent confusion per RN. Patient appeared aware of cognitive changes and expressed "I'[name] a lot slower to think". SLP attempted to administer formal cognitive-linguistic assessment via Cognistat however fatigue limited alertness and therefore recommended to be deferred to when pt is less drowsy for more accurate results. Pt completed the [facility] Mental Status Examination by acute care SLP on 03/07/21 and patient scored 22/30 suggestive of mild impairment at the  time.  Patient's overall auditory comprehension and verbal expression appeared [employer] for all tasks assessed. Speech was notable for mild dysarthria characterized by decreased articulatory precision and vocal intensity which reduced overall speech intelligibility at the sentence and conversation level. Fatigue impacted speech difficulty as to be expected.   Patient had just finished consuming noon meal shortly prior to arrival and patient declined consideration for PO trials for further assessment at this time. He was agreeable to consume sips of nectar thick liquid by cup in which he consumed without overt s/s of aspiration. Pt obtained modified barium swallow study yesterday (03/10/21) which revealed a moderate to severe pharyngeal dysphagia with recommendations for dysphagia 3 diet, nectar thick liquids, and crushed medications. Beneficial strategies per MBS included chin tuck, following solids with liquids, and multiple dry swallows. Anticipate pt unable to recall and implement these strategies consistently due to cognitive impairments. Recommend continuation of current diet, crushed meds, and full supervision with PO intake for implementation of safe swallowing compensatory strategies and safety. Plan for further assessment of diet tolerance with current diet when pt more alert.   Patient would benefit from skilled SLP intervention to maximize cognitive, speech, and swallowing functioning and overall functional independence prior to discharge.    Skilled Therapeutic Interventions          Pt participated in [place] Status Examination (SLUMS) as well as further  non-standardized assessments of cognitive-linguistic, speech, and language function. Completed oral mechanism exam and brief clinical swallow assessment. Please see above.     SLP Assessment  Patient will need skilled Speech Lanaguage Pathology Services during CIR admission    Recommendations  SLP Diet Recommendations:  Dysphagia 3 (Mech soft);Nectar Liquid Administration via: Cup;Straw Medication Administration: Crushed with puree Supervision: Staff to assist with self feeding;Full supervision/cueing for compensatory strategies Compensations: Slow rate;Small sips/bites;Follow solids with liquid;Clear throat intermittently;Effortful swallow;Chin tuck;Multiple dry swallows after each bite/sip Oral Care Recommendations: Oral care BID Patient destination: Home Follow up Recommendations: 24 hour supervision/assistance;Outpatient SLP;Home Health SLP Equipment Recommended: To be determined    SLP Frequency 3 to 5 out of 7 days   SLP Duration  SLP Intensity  SLP Treatment/Interventions ~2 weeks  Minumum of 1-2 x/day, 30 to 90 minutes  Cognitive remediation/compensation;Internal/external aids;Speech/Language facilitation;Cueing hierarchy;Dysphagia/aspiration precaution training;Functional tasks;Patient/family education;Therapeutic Activities    Pain Pain Assessment Pain Scale: Faces Pain Score: 0-No pain  Prior Functioning Cognitive/Linguistic Baseline: Within functional limits Type of Home: House  Lives With: Spouse Available Help at Discharge: Available 24 hours/day;Family Vocation: Retired  SLP Evaluation Cognition Overall Cognitive Status: Impaired/Different from baseline Arousal/Alertness: Lethargic Orientation Level: Oriented X4 Year: 2023 Month: January Day of Week: Correct Attention: Focused;Sustained Focused Attention: Appears intact Focused Attention Impairment: Functional basic Sustained Attention: Impaired Sustained Attention Impairment: Functional basic;Verbal basic Memory: Impaired Memory Impairment: Decreased recall of new information;Storage deficit Immediate Memory Recall: Sock;Blue;Bed Memory Recall Sock: Without Cue Memory Recall Blue: Without Cue Memory Recall Bed: Not able to recall Awareness: Impaired Awareness Impairment: Emergent impairment Problem Solving:  Impaired Problem Solving Impairment: Verbal basic;Functional basic Executive Function: Sequencing;Organizing Safety/Judgment: Impaired  Comprehension Auditory Comprehension Overall Auditory Comprehension: Appears within functional limits for tasks assessed Yes/No Questions: Within Functional Limits Commands: Within Functional Limits Conversation: Complex Expression Expression Primary Mode of Expression: Verbal Verbal Expression Overall Verbal Expression: Appears within functional limits for tasks assessed Initiation: No impairment Level of Generative/Spontaneous Verbalization: Conversation Repetition: No impairment Naming: No impairment Written Expression Dominant Hand: Right Written Expression: Not tested Oral Motor Oral Motor/Sensory Function Overall Oral Motor/Sensory Function: Mild impairment Facial ROM: Reduced left;Suspected CN VII (facial) dysfunction Facial Symmetry: Abnormal symmetry left;Suspected CN VII (facial) dysfunction Facial Strength: Reduced left;Suspected CN VII (facial) dysfunction Lingual ROM: Reduced right;Reduced left Lingual Symmetry: Within Functional Limits Lingual Strength: Reduced Mandible: Within Functional Limits Motor Speech Overall Motor Speech: Impaired Respiration: Impaired Level of Impairment: Conversation Phonation: Low vocal intensity Resonance: Within functional limits Articulation: Impaired Level of Impairment: Sentence Intelligibility: Intelligibility reduced Word: 75-100% accurate Phrase: 75-100% accurate Sentence: 75-100% accurate Conversation: 50-74% accurate Motor Planning: Witnin functional limits Motor Speech Errors: Aware;Consistent  Care Tool Care Tool Cognition Ability to hear (with hearing aid or hearing appliances if normally used Ability to hear (with hearing aid or hearing appliances if normally used): 1. Minimal difficulty - difficulty in some environments (e.g. when person speaks softly or setting is noisy)    Expression of Ideas and Wants Expression of Ideas and Wants: 3. Some difficulty - exhibits some difficulty with expressing needs and ideas (e.g, some words or finishing thoughts) or speech is not clear   Understanding Verbal and Non-Verbal Content Understanding Verbal and Non-Verbal Content: 3. Usually understands - understands most conversations, but misses some part/intent of message. Requires cues at times to understand  Memory/Recall Ability Memory/Recall Ability : Current season   Intelligibility: Intelligibility reduced Word: 75-100% accurate Phrase: 75-100% accurate Sentence: 75-100% accurate Conversation: 50-74% accurate  Bedside Swallowing  Assessment General Date of Onset: 03/05/21 Previous Swallow Assessment: 03/05/2021 MBS Diet Prior to this Study: Dysphagia 3 (soft);Nectar-thick liquids Temperature Spikes Noted: No Respiratory Status: Room air History of Recent Intubation: No Behavior/Cognition: Cooperative;Pleasant mood;Lethargic/Drowsy Oral Cavity - Dentition: Adequate natural dentition Self-Feeding Abilities: Needs assist Patient Positioning: Upright in chair/Tumbleform Baseline Vocal Quality: Normal Volitional Cough: Strong;Congested Volitional Swallow: Able to elicit  Oral Care Assessment Does patient have any of the following "high(er) risk" factors?: None of the above Does patient have any of the following "at risk" factors?: Oxygen therapy - cannula, mask, simple oxygen devices Patient is AT RISK: Order set for Adult Oral Care Protocol initiated -  "At Risk Patients" option selected (see row information) Ice Chips Ice chips: Not tested Thin Liquid Thin Liquid: Not tested Nectar Thick Nectar Thick Liquid: Within functional limits Presentation: Cup Honey Thick Honey Thick Liquid: Not tested Puree Puree: Not tested (pt refused solid trials as he just finished consumin lunch) Solid Solid: Not tested (pt refused solid trials as he just finished consumin  lunch) BSE Assessment Risk for Aspiration Impact on safety and function: Moderate aspiration risk;Risk for inadequate nutrition/hydration Other Related Risk Factors: History of pneumonia  Short Term Goals: Week 1: SLP Short Term Goal 1 (Week 1): Patient will consume current diet with effective mastication, oral clearance, and without overt s/s of aspiration with min A cues to implement safe swallowing compensatory strategies SLP Short Term Goal 2 (Week 1): Patient will implement speech intelligiblity strategies at sentence level with min A cues to achieve >90% intelligiblity SLP Short Term Goal 3 (Week 1): Patient will use external memory strategies to recall functiona, novel information with min A verbal cues SLP Short Term Goal 4 (Week 1): Patient will complete mildly complex problem solving with min A cues SLP Short Term Goal 5 (Week 1): Patient will demonstrate awareness to errors during functional and familiar tasks with min A verbal cues  Refer to Care Plan for Long Term Goals  Recommendations for other services: None   Discharge Criteria: Patient will be discharged from SLP if patient refuses treatment 3 consecutive times without medical reason, if treatment goals not met, if there is a change in medical status, if patient makes no progress towards goals or if patient is discharged from hospital.  The above assessment, treatment plan, treatment alternatives and goals were discussed and mutually agreed upon: by patient  Patty Sermons 03/10/2021, 5:13 PM

## 2021-03-10 NOTE — Evaluation (Signed)
Occupational Therapy Assessment and Plan  Patient Details  Name: David [name] MRN: [id number] Date of Birth: 1940-07-22  OT Diagnosis: abnormal posture, altered mental status, cognitive deficits, hemiplegia affecting non-dominant side, and muscle weakness (generalized) Rehab Potential: Rehab Potential (ACUTE ONLY): Good ELOS: 14-16 days   Today's Date: 03/10/2021 OT Individual Time: 3007-6226 OT Individual Time Calculation (min): 62 min     Hospital Problem: Principal Problem:   Acute right MCA stroke [facility])   Past Medical History:  Past Medical History:  Diagnosis Date   Allergy    Arthritis    gen.  and left hip   BPH (benign prostatic hypertrophy)    Coronary artery disease    GERD (gastroesophageal reflux disease)    occasional   History of kidney stones    2007   Hyperlipidemia    Hypertension    Kidney tumor    LBP (low back pain) 06/2005   L5 radicular symptoms; MRI of LS spine severe spondylosis at L4-L5 with central cancal stenosis    Nocturia    Organic impotence    Prostate cancer ([place]) 10/2011   (Low grade) [facility] - Dr. Junious Silk   Pyelonephritis 03/05/2013   Renal oncocytoma of right kidney 01/23/2013   As 3.2 x 3.5 cm enhancing exophytic mass projecting off the lower pole. This is consistent with a renal cell carcinoma. No retroperitoneal lymphadenopathy.    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   Wears glasses    Past Surgical History:  Past Surgical History:  Procedure Laterality Date   COLONOSCOPY  02/15/2006, 2014   Internal hemorrhoids (Dr [name]), last in 2014   [place] Right 1980   IR ANGIO INTRA EXTRACRAN SEL INTERNAL CAROTID UNI R MOD SED  03/05/2021   IR INTRAVSC STENT CERV CAROTID W/EMB-PROT MOD SED INCL ANGIO  03/05/2021   IR US GUIDE VASC ACCESS RIGHT  03/05/2021   LOOP RECORDER INSERTION N/A 03/22/2021   Procedure: LOOP RECORDER INSERTION;  Surgeon: Constance Haw, MD;  Location: [facility];  Service:  Cardiovascular;  Laterality: N/A;   LYMPHADENECTOMY Bilateral 02/15/2013   Procedure: LYMPHADENECTOMY WITH INDOCYANINE GREEN DYE INJECTION;  Surgeon: [name], MD;  Location: [facility];  Service: Urology;  Laterality: Bilateral;   PROSTATE BIOPSY N/A 12/18/2012   Procedure: BIOPSY TRANSRECTAL ULTRASONIC PROSTATE (TUBP);  Surgeon: [name], MD;  Location: [facility];  Service: Urology;  Laterality: N/A;   PROSTATE BIOPSY  12/05/11   gleason 6, 3/12 cores   RADIOLOGY WITH ANESTHESIA N/A 03/05/2021   Procedure: IR WITH ANESTHESIA;  Surgeon: Radiologist, Medication, MD;  Location: [facility];  Service: Radiology;  Laterality: N/A;   REPAIR UMBILICAL AND VENTRAL HERNIA'S W/ MESH  08-21-2009   RIGHT/LEFT HEART CATH AND CORONARY ANGIOGRAPHY N/A 08/06/2019   Procedure: RIGHT/LEFT HEART CATH AND CORONARY ANGIOGRAPHY;  Surgeon: [name], Peter M, MD;  Location: [facility] [facility];  Service: Cardiovascular;  Laterality: N/A;   ROBOT ASSISTED LAPAROSCOPIC RADICAL PROSTATECTOMY N/A 02/15/2013   Procedure: ROBOTIC ASSISTED LAPAROSCOPIC RADICAL PROSTATECTOMY;  Surgeon: [name], MD;  Location: [facility];  Service: Urology;  Laterality: N/A;   ROBOTIC ASSITED PARTIAL NEPHRECTOMY Right 02/15/2013   Procedure: ROBOTIC ASSITED PARTIAL NEPHRECTOMY;  Surgeon: [name], MD;  Location: [facility];  Service: Urology;  Laterality: Right;   SPERMATOCELECTOMY Left 12/18/2012   Procedure: LEFT SPERMATOCELECTOMY;  Surgeon: [name], MD;  [facility];  Service: Urology;  Laterality: Left;   TONSILLECTOMY  AND ADENOIDECTOMY  as child   TOTAL KNEE ARTHROPLASTY Left 05/26/2020   Procedure: TOTAL KNEE ARTHROPLASTY;  Surgeon: Paralee Cancel, MD;  Location: [facility];  Service: Orthopedics;  Laterality: Left;  70 mins   TRANSURETHRAL RESECTION OF PROSTATE  AGE 77    Assessment & Plan Clinical Impression: Patient is a 81 y.o. year old male with recent admission to the  hospital on March 05, 2021.  Code stroke was initiated he was outside the window for thrombolytics and his CT/CTA of the head and neck revealed right ICA occlusion.  Code IR activated.  He underwent diagnostic cerebral angiogram with right carotid stenting with cerebral protective device by Dr. Karenann Cai.  Findings revealed atherosclerotic changes of the right carotid bifurcation with associated thrombus at approximately 80 to 90% stenosis in the right carotid bulb.  A 6 to 8 x 40 mm XACT carotid stent was utilized.  Ticagrelor 90 mg twice daily and aspirin 81 mg daily initiated.  MRI of the brain showed right frontal cortical small infarcts and left CR small infarct. .  Patient transferred to CIR on 03/25/2021 .    Patient currently requires mod with basic self-care skills secondary to muscle weakness, muscle joint tightness, and muscle paralysis, impaired timing and sequencing, unbalanced muscle activation, and decreased coordination, decreased attention, decreased awareness, decreased problem solving, decreased safety awareness, decreased memory, and delayed processing, and decreased standing balance, decreased postural control, hemiplegia, and decreased balance strategies.  Prior to hospitalization, patient could complete ADLs with independent .  Patient will benefit from skilled intervention to decrease level of assist with basic self-care skills and increase independence with basic self-care skills prior to discharge home with care partner.  Anticipate patient will require 24 hour supervision and follow up home health.  OT - End of Session Activity Tolerance: Decreased this session Endurance Deficit: Yes Endurance Deficit Description: Pt with dyspnea 2/4 while sitting during ADL tasks. OT Assessment Rehab Potential (ACUTE ONLY): Good OT Patient demonstrates impairments in the following area(s): Balance;Endurance;Motor;Cognition;Sensory;Safety OT Basic ADL's Functional Problem(s):  Grooming;Bathing;Dressing;Toileting;Eating OT Advanced ADL's Functional Problem(s): Simple Meal Preparation OT Transfers Functional Problem(s): Toilet;Tub/Shower OT Additional Impairment(s): Fuctional Use of Upper Extremity OT Plan OT Intensity: Minimum of 1-2 x/day, 45 to 90 minutes OT Frequency: 5 out of 7 days OT Duration/Estimated Length of Stay: 14-16 days OT Treatment/Interventions: Teacher, English as a foreign language;Discharge planning;Self Care/advanced ADL retraining;Therapeutic Activities;UE/LE Coordination activities;Patient/family education;Functional mobility training;Therapeutic Exercise;UE/LE Strength taining/ROM;Neuromuscular re-education;Cognitive remediation/compensation;Pain management;Disease mangement/prevention;Community reintegration OT Self Feeding Anticipated Outcome(s): independent OT Basic Self-Care Anticipated Outcome(s): supervision OT Toileting Anticipated Outcome(s): supervision OT Bathroom Transfers Anticipated Outcome(s): supervision OT Recommendation Patient destination: Home Follow Up Recommendations: 24 hour supervision/assistance;Home health OT Equipment Recommended: To be determined   OT Evaluation Precautions/Restrictions  Precautions Precautions: Fall;Other (comment) Precaution Comments: L hemiparesis Restrictions Weight Bearing Restrictions: No  Pain Pain Assessment Pain Scale: Faces Pain Score: 0-No pain Home Living/Prior Functioning Home Living Family/patient expects to be discharged to:: Private residence Living Arrangements: Spouse/significant other Available Help at Discharge: Available 24 hours/day, Family Type of Home: House Home Access: Stairs to enter [employer] of Steps: 2 Entrance Stairs-Rails: Can reach both, Left, Right Home Layout: One level Bathroom Shower/Tub: Gaffer, Chiropodist: Handicapped height Bathroom Accessibility: Yes Additional Comments:  grab bars in the shower and at the toilet per report.  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 [place] Vision/History: 1 Wears glasses Ability to See in Adequate Light: 1 Impaired Patient Visual Report: No change from baseline Vision Assessment?: Yes Eye Alignment: Within Functional Limits Ocular Range of Motion: Within Functional Limits Alignment/Gaze Preference: Within Defined Limits Tracking/Visual Pursuits: Decreased smoothness of horizontal tracking;Decreased smoothness of vertical tracking;Other (comment) (lost fixation with all tracking) Saccades: Additional eye shifts occurred during testing;Additional head turns occurred during testing Convergence: Within functional limits Visual Fields: No apparent deficits Perception  Perception: Within Functional Limits Praxis Praxis: Intact Cognition Overall Cognitive Status: Impaired/Different from baseline Arousal/Alertness: Lethargic Orientation Level: Person;Place;Situation Person: Oriented Place: Disoriented Situation: Oriented Year: 2023 Month: January Day of Week: Correct Memory: Impaired Memory Impairment: Decreased recall of new information;Storage deficit Immediate Memory Recall: Sock;Blue;Bed Memory Recall Sock: Without Cue Memory Recall Blue: Without Cue Memory Recall Bed: Not able to recall Attention: Focused;Sustained Focused Attention: Appears intact Focused Attention Impairment: Functional basic Sustained  Attention: Impaired Sustained Attention Impairment: Functional basic;Verbal basic Awareness: Impaired Awareness Impairment: Emergent impairment Problem Solving: Impaired Problem Solving Impairment: Verbal basic;Functional basic Executive Function: Sequencing;Organizing Safety/Judgment: Impaired Magazine features editor: Appears Intact Hot/Cold: Not tested Proprioception: Not tested Stereognosis: Not tested Additional Comments: Pt reports having decreased light touch acuity in the left hand compared to the right. Coordination Gross Motor Movements are Fluid and Coordinated: No Fine Motor Movements are Fluid and Coordinated: No Coordination and Movement Description: Pt exhibits mild LUE functional impairment with functional use currently at a diminshed level for selfcare tasks.  Increased difficulty using the LUE for pulling up his clothing, but he was able to hold onto bottles when opening them. Motor  Motor Motor: Other (comment);Hemiplegia Motor - Skilled Clinical Observations: impaired balance with very mild L hemiparesis  Trunk/Postural Assessment  Cervical Assessment Cervical Assessment: Exceptions to [facility] (forward head) Thoracic Assessment Thoracic Assessment: Exceptions to [facility] (thoracic kyphosis) Lumbar Assessment Lumbar Assessment: Exceptions to [facility] (posterior pelvic tilt)  Balance Balance Balance Assessed: Yes Static Sitting Balance Static Sitting - Balance Support: Feet supported Static Sitting - Level of Assistance: 5: Stand by assistance Dynamic Sitting Balance Dynamic Sitting - Balance Support: Feet supported Dynamic Sitting - Level of Assistance:  (contact guard) Static Standing Balance Static Standing - Balance Support: During functional activity;Left upper extremity supported Static Standing - Level of Assistance: 4: Min assist Extremity/Trunk Assessment RUE Assessment RUE Assessment: Within Functional Limits General Strength Comments: [place] for gross  assessment during functional use with ADL tasks, but not formally assessed. LUE Assessment LUE Assessment: Exceptions to [facility] Active Range of Motion (AROM) Comments: AAROM WFLS for all joints General Strength Comments: Pt currently at Brunnstrum stage VI level with isolated movement in the hand and arm.  Decreased strength at 3/5 throughout in the shoulder with 3+/5 at the elbow.  Also with 3/5 in the digits as well.  Decreased FM coordination noted when attempting to open items.  He is able to use at a gross assist level to hold objects when opening, but unable to use the LUE to remove the top.  Care Tool Care Tool Self Care Eating   Eating Assist Level: Set up assist    Oral Care    Oral Care Assist Level: Supervision/Verbal cueing    Bathing   Body parts bathed by patient:  Right arm;Left arm;Chest;Abdomen;Front perineal area;Buttocks;Right upper leg;Left upper leg;Right lower leg;Left lower leg;Face     Assist Level: Moderate Assistance - Patient 50 - 74%    Upper Body Dressing(including orthotics)   What is the patient wearing?: Pull over shirt   Assist Level: Moderate Assistance - Patient 50 - 74%    Lower Body Dressing (excluding footwear)   What is the patient wearing?: Pants;Incontinence brief Assist for lower body dressing: Moderate Assistance - Patient 50 - 74%    Putting on/Taking off footwear   What is the patient wearing?: Non-skid slipper socks;Ted hose Assist for footwear: Maximal Assistance - Patient 25 - 49%       Care Tool Toileting Toileting activity   Assist for toileting: Moderate Assistance - Patient 50 - 74%     Care Tool Bed Mobility Roll left and right activity        Sit to lying activity        Lying to sitting on side of bed activity   Lying to sitting on side of bed assist level: the ability to move from lying on the back to sitting on the side of the bed with no back support.: Moderate Assistance - Patient 50 - 74%     Care Tool  Transfers Sit to stand transfer   Sit to stand assist level: Minimal Assistance - Patient > 75%    Chair/bed transfer         Toilet transfer   Assist Level: Moderate Assistance - Patient 50 - 74%     Care Tool Cognition  Expression of Ideas and Wants Expression of Ideas and Wants: 3. Some difficulty - exhibits some difficulty with expressing needs and ideas (e.g, some words or finishing thoughts) or speech is not clear  Understanding Verbal and Non-Verbal Content Understanding Verbal and Non-Verbal Content: 3. Usually understands - understands most conversations, but misses some part/intent of message. Requires cues at times to understand   Memory/Recall Ability Memory/Recall Ability : None of the above were recalled   Refer to Care Plan for Long Term Goals  SHORT TERM GOAL WEEK 1 OT Short Term Goal 1 (Week 1): Pt will complete UB dressing at supervision level sitting unsupported. OT Short Term Goal 2 (Week 1): Pt will complete LB dressing with min assist sit to stand for all aspects. OT Short Term Goal 3 (Week 1): Pt will complete toilet transfers with min assist using the RW and 3:1 OT Short Term Goal 4 (Week 1): Pt will complete two grooming tasks in standing at the sink with min guard assist for at least 3 mins.  Recommendations for other services: None    Skilled Therapeutic Intervention ADL ADL Eating: Supervision/safety Where Assessed-Eating: Bed level Grooming: Supervision/safety Where Assessed-Grooming: Bed level Upper Body Bathing: Minimal assistance Where Assessed-Upper Body Bathing: Edge of bed Lower Body Bathing: Moderate assistance Where Assessed-Lower Body Bathing: Edge of bed Upper Body Dressing: Moderate assistance Where Assessed-Upper Body Dressing: Edge of bed Lower Body Dressing: Moderate assistance Where Assessed-Lower Body Dressing: Edge of bed Toileting: Moderate assistance Where Assessed-Toileting: Bedside Commode Toilet Transfer: Moderate  assistance Toilet Transfer Method: Stand pivot;Ambulating Science writer: Radiographer, therapeutic: Not assessed Social research officer, government: Not assessed Mobility  Bed Mobility Bed Mobility: Supine to Sit;Sit to Supine Supine to Sit: Moderate Assistance - Patient 50-74% Sit to Supine: Minimal Assistance - Patient > 75% Transfers Sit to Stand: Moderate Assistance - Patient 50-74% Stand to Sit: Moderate Assistance - Patient 50-74%  Session  Note:  Pt in the bed to start session with some difficulty arousing to verbal stimulation.  He was able to open his eyes briefly but need transfer to sitting EOB to maintain sustained attention.  He was oriented to situation but not place.  He was also able to state some deficits of balance, speech, and left side weakness.  He was able to maintain sustained attention better when sitting and participate in ADL tasks from this position.  Min assist for UB bathing with mod assist for LB.  Mod assist for donning a pullover shirt with mod assist for donning LB clothing as well.  He was able to complete sit to stand at min to mod assist.  Dyspnea at 3/4 after standing for 3 mins with O2 sats remaining at 91% or greater on 2Ls nasal cannula.  Sats decreased to 88% with completion of donning his shirt on room air.  Finished session with transfer back to supine at min assist and pt left with call button and phone in reach.  Discussed expectations for LOS of around 2 weeks and supervision level goals.  Pt thankful and in agreement.    Discharge Criteria: Patient will be discharged from OT if patient refuses treatment 3 consecutive times without medical reason, if treatment goals not met, if there is a change in medical status, if patient makes no progress towards goals or if patient is discharged from hospital.  The above assessment, treatment plan, treatment alternatives and goals were discussed and mutually agreed upon: by patient  Shermeka Rutt  OTR/L 03/10/2021, 5:10 PM

## 2021-03-10 NOTE — Plan of Care (Signed)
°  Problem: RH Swallowing Goal: LTG Patient will consume least restrictive diet using compensatory strategies with assistance (SLP) Description: LTG:  Patient will consume least restrictive diet using compensatory strategies with assistance (SLP) Flowsheets (Taken 03/10/2021 1716) LTG: Pt Patient will consume least restrictive diet using compensatory strategies with assistance of (SLP): Supervision Goal: LTG Patient will participate in dysphagia therapy to increase swallow function with assistance (SLP) Description: LTG:  Patient will participate in dysphagia therapy to increase swallow function with assistance (SLP) Flowsheets (Taken 03/10/2021 1716) LTG: Pt will participate in dysphagia therapy to increase swallow function with assistance of (SLP): Supervision Goal: LTG Pt will demonstrate functional change in swallow as evidenced by bedside/clinical objective assessment (SLP) Description: LTG: Patient will demonstrate functional change in swallow as evidenced by bedside/clinical objective assessment (SLP) Flowsheets (Taken 03/10/2021 1716) LTG: Patient will demonstrate functional change in swallow as evidenced by bedside/clinical objective assessment: Oropharyngeal swallow   Problem: RH Expression Communication Goal: LTG Patient will increase speech intelligibility (SLP) Description: LTG: Patient will increase speech intelligibility at word/phrase/conversation level with cues, % of the time (SLP) Flowsheets (Taken 03/10/2021 1716) LTG: Patient will increase speech intelligibility (SLP): Modified Independent   Problem: RH Problem Solving Goal: LTG Patient will demonstrate problem solving for (SLP) Description: LTG:  Patient will demonstrate problem solving for basic/complex daily situations with cues  (SLP) Flowsheets (Taken 03/10/2021 1716) LTG: Patient will demonstrate problem solving for (SLP): Complex daily situations LTG Patient will demonstrate problem solving for: Supervision   Problem:  RH Memory Goal: LTG Patient will use memory compensatory aids to (SLP) Description: LTG:  Patient will use memory compensatory aids to recall biographical/new, daily complex information with cues (SLP) Flowsheets (Taken 03/10/2021 1716) LTG: Patient will use memory compensatory aids to (SLP): Supervision   Problem: RH Awareness Goal: LTG: Patient will demonstrate awareness during functional activites type of (SLP) Description: LTG: Patient will demonstrate awareness during functional activites type of (SLP) Flowsheets (Taken 03/10/2021 1716) Patient will demonstrate during cognitive/linguistic activities awareness type of: Emergent LTG: Patient will demonstrate awareness during cognitive/linguistic activities with assistance of (SLP): Supervision

## 2021-03-10 NOTE — Progress Notes (Addendum)
0300 Pt assessed and noted to have excess bleeding to upper L chest Loop recorder incision site. Gown was wet soiled with bright red blood. Vitals obtained. BP is slightly elevated HR and temp is WNL. Pt C/O 8/10 lower headache pain. 650 mg of acetaminophen given. Pain reduced to 4/10. Pressure dressing applied to incision site. Site continues to bleed through pressure dressing. RN will continue to monitor and redress the site.    RN called charge nurse Wells Guiles to bedside to assess with this RN. She stayed at the bedside until bleeding was controlled with the pressure dressing and was no longer bleeding through more than one gauze. Charge Nurse suggested it was okay not to call the MD because this was common to the unit and non urgent. Pt was hemodynamically stable at the time and did not require further interventions. Report given to oncoming Nurse Pearline Cables LPN and bedside report given and the loop recorder site visualized and assessed. The bleeding had completely stopped by this time at [place]. Pearline Cables reported that she would discuss with oncoming MD. Care transferred to [facility], [place].

## 2021-03-11 DIAGNOSIS — I63511 Cerebral infarction due to unspecified occlusion or stenosis of right middle cerebral artery: Secondary | ICD-10-CM | POA: Diagnosis not present

## 2021-03-11 MED ORDER — ALBUTEROL SULFATE (2.5 MG/3ML) 0.083% IN NEBU
3.0000 mL | INHALATION_SOLUTION | Freq: Four times a day (QID) | RESPIRATORY_TRACT | Status: DC
  Administered 2021-03-11 – 2021-03-12 (×3): 3 mL via RESPIRATORY_TRACT
  Filled 2021-03-11 (×4): qty 3

## 2021-03-11 NOTE — Progress Notes (Signed)
[facility] Individual Statement of Services  Patient Name:  David [name]  Date:  03/11/2021  Welcome to the [facility].  Our goal is to provide you with an individualized program based on your diagnosis and situation, designed to meet your specific needs.  With this comprehensive rehabilitation program, you will be expected to participate in at least 3 hours of rehabilitation therapies Monday-Friday, with modified therapy programming on the weekends.  Your rehabilitation program will include the following services:  Physical Therapy (PT), Occupational Therapy (OT), Speech Therapy (ST), 24 hour per day rehabilitation nursing, Therapeutic Recreaction (TR), Neuropsychology, Care Coordinator, Rehabilitation Medicine, Nutrition Services, Pharmacy Services, and Other  Weekly team conferences will be held on Wednesdays to discuss your progress.  Your Inpatient Rehabilitation Care Coordinator will talk with you frequently to get your input and to update you on team discussions.  Team conferences with you and your family in attendance may also be held.  Expected length of stay: 10-14 Days  Overall anticipated outcome: Supervision to Min A  Depending on your progress and recovery, your program may change. Your Inpatient Rehabilitation Care Coordinator will coordinate services and will keep you informed of any changes. Your Inpatient Rehabilitation Care Coordinator's name and contact numbers are listed  below.  The following services may also be recommended but are not provided by the [facility]:   [place] will be made to provide these services after discharge if needed.  Arrangements include referral to agencies that provide these services.  Your insurance has been verified to be:  [facility] MEDICARE Your primary doctor is:  Plotnikov, Tyrone Apple, MD  Pertinent  information will be shared with your doctor and your insurance company.  Inpatient Rehabilitation Care Coordinator:  Erlene Quan, [place] or [phone]  Information discussed with and copy given to patient by: Dyanne Iha, 03/11/2021, 10:35 AM

## 2021-03-11 NOTE — Progress Notes (Signed)
Physical Therapy Session Note  Patient Details  Name: PHU RECORD MRN: [id number] Date of Birth: 1940/10/06  Today's Date: 03/11/2021 PT Individual Time: 0850-0947 PT Individual Time Calculation (min): 57 min   Short Term Goals: Week 1:  PT Short Term Goal 1 (Week 1): Pt will perform supine<>sit with min assist PT Short Term Goal 2 (Week 1): Pt will perform sit<>stands using LRAD with CGA PT Short Term Goal 3 (Week 1): Pt will perform stand pivot transfers using LRAD with CGA PT Short Term Goal 4 (Week 1): Pt will ambulate at least 66ft using LRAD with min assist of 1 PT Short Term Goal 5 (Week 1): Pt will navigate 4 stairs using HRs with min assist of 1  Skilled Therapeutic Interventions/Progress Updates:    Pt received supine in bed with RN present for medication administration. Pt agreeable to therapy session. Pt received and maintained on 2L of O2 via nasal cannula throughout session - SpO2 92% sitting EOB at beginning of session and maintaining between 92-94% throughout session.   Supine>sitting L EOB, HOB elevated 40 degrees and using bedrails as needed, with CGA. Sitting EOB donned TED hose, pants, and shoes total assist for time management - pt able to maintain sitting balance with close supervision. Pt reports need to void - noticed to have small amount of bladder incontinence in brief (pt reports this is due to prostate hx). Sit>stand from slightly elevated EOB>RW with min assist for rising to stand - standing with CGA and pt holding urinal using UE support on RW as needed,  continent of bladder with pt unable to feel when voiding and having further incontinence after removing urinal requiring 2nd brief change. R stand pivot to w/c using RW with min assist for balance - cuing to turn fully with AD and step back prior to initiating sit.  Transported to/from gym in w/c for time management and energy conservation. Sit>stands from w/c to RW with min assist for rising to stand  throughout session. Gait training 77ft x2 using RW with min assist for balance (therapist also managing O2 line) - continues to demo very slow gait speed with very short steps and narrow BOS as well as kyphotic posturing - minor posterior LOB when turning to sit requiring heavier min assist to maintain balance and cuing for AD management while backing up to chair.   Stair navigation training ascending/descending 4 stairs (6" height) using B HRs with reciprocal pattern on ascent and step-to leading with R LE on descent - min assist for safety and balance due to minor trunk lean. Transported back to room and pt agreeable to remain sitting in w/c. Pt left with needs in reach, lines intact, and seat belt alarm on.  Therapy Documentation Precautions:  Precautions Precautions: Fall, Other (comment) Precaution Comments: L hemiparesis Restrictions Weight Bearing Restrictions: No   Pain: Denies pain during session.    Therapy/Group: Individual Therapy  Tawana Scale , PT, DPT, NCS, CSRS  03/11/2021, 9:22 AM

## 2021-03-11 NOTE — Discharge Summary (Incomplete)
Physician Discharge Summary  Patient ID: David [name] MRN: [id number] DOB/AGE: 80-Nov-1942 80 y.o.  Admit date: 03/03/2021 Discharge date:   Discharge Diagnoses:  Principal Problem:   Acute right MCA stroke [facility]) Functional deficits secondary to stroke Active problems: Pneumonia Cough Lethargy PVCs Hypertension Hyperlipidemia Alcohol use Overweight CKD stage 3a Anemia Thyroid nodule Dysphagia Left trochanteric bursitis UTI Protein malnutrition  Discharged Condition: {condition:18240}  Significant Diagnostic Studies: CT ANGIO HEAD NECK W WO CM  Result Date: 03/05/2021 CLINICAL DATA:  Provided history: Neuro deficit, acute, stroke suspected. Left-sided weakness. EXAM: CT ANGIOGRAPHY HEAD AND NECK TECHNIQUE: Multidetector CT imaging of the head and neck was performed using the standard protocol during bolus administration of intravenous contrast. Multiplanar CT image reconstructions and MIPs were obtained to evaluate the vascular anatomy. Carotid stenosis measurements (when applicable) are obtained utilizing NASCET criteria, using the distal internal carotid diameter as the denominator. CONTRAST:  21m OMNIPAQUE IOHEXOL 350 MG/ML SOLN COMPARISON:  Noncontrast head CT performed earlier today 03/05/2021. Thyroid ultrasound 02/27/2020. FINDINGS: CTA NECK FINDINGS Aortic arch: Standard aortic branching. Atherosclerotic plaque within the visualized aortic arch and proximal major branch vessels of the neck. Atherosclerotic plaque within the visualized aortic arch and proximal major branch vessels of the neck. No hemodynamically significant innominate or proximal subclavian artery stenosis. Right carotid system: CCA and ICA patent within the neck. Prominent irregular hypodensity within the proximal right ICA. A portion of this likely reflects soft plaque. However, the irregular portion is highly suspicious for superimposed thrombus/unstable plaque (for instance as seen on series 9, image 61).  Resultant severe stenosis of the proximal ICA estimated to be 80% (series 7, image 327). Minimal calcified plaque is also present about the carotid bifurcation. Left carotid system: CCA and ICA patent within neck without stenosis. Minimal atherosclerotic plaque about the carotid bifurcation and within the proximal ICA. Vertebral arteries: Vertebral arteries patent within the neck. Nonstenotic plaque at the origin of both vessels. The left vertebral artery is dominant. Skeleton: Partially imaged thoracic dextrocurvature. Cervical and upper thoracic spondylosis. Multilevel bridging ventral osteophytes within the imaged upper thoracic spine. No acute bony abnormality or aggressive osseous lesion. Other neck: No cervical lymphadenopathy. 3 cm exophytic nodule extending inferiorly from the right thyroid lobe in the right paratracheal region (for instance as seen on series 7, image 288). 8 mm nodule within the inferior right thyroid lobe. Upper chest: No consolidation within the imaged lung apices. Review of the MIP images confirms the above findings CTA HEAD FINDINGS Anterior circulation: The intracranial internal carotid arteries are patent. Atherosclerotic plaque within both vessels. Moderate stenosis of the paraclinoid right ICA. Mild stenosis of the paraclinoid left ICA. The M1 middle cerebral arteries are patent. No M2 proximal branch occlusion is identified. Atherosclerotic irregularity of the M2 and more distal MCA vessels, bilaterally. Most notably, there is an apparent moderate/severe stenosis within superior division mid M2 right MCA vessel (series 12, image 17). The anterior cerebral arteries are patent. Hypoplastic left A1 segment. Atherosclerotic irregularity of both anterior cerebral arteries. Most notably, there is a moderate stenosis within the A4 left ACA. No intracranial aneurysm is identified. Posterior circulation: The intracranial vertebral arteries are patent. The basilar artery is patent. The  posterior cerebral arteries are patent. Posterior communicating arteries are diminutive or absent bilaterally. Venous sinuses: Within the limitations of contrast timing, no convincing thrombus. Anatomic variants: As described. Review of the MIP images confirms the above findings CTA neck impression #1 and CTA head impressions #1 and #3 called by telephone  at the time of interpretation on 03/05/2021 at 2:42 pm to provider Dr. Quinn Axe, who verbally acknowledged these results. IMPRESSION: CTA neck: 1. Prominent irregular hypodensity within the proximal right ICA. A portion of this hypodensity likely reflects soft plaque. However, the irregular portion is highly suspicious for superimposed thrombus (or unstable plaque). Resultant severe stenosis of the proximal right ICA estimated to be 80%. 2. The left common carotid, left internal carotid and bilateral vertebral arteries are patent within the neck without significant stenosis. Mild atherosclerotic plaque within these vessels, as described. 3. 3 cm exophytic nodule extending inferiorly from the right thyroid lobe. This nodule was not described on the prior thyroid ultrasound of 02/27/2020. A repeat thyroid ultrasound is recommended to further characterize this nodule. 4.  Aortic Atherosclerosis (ICD10-I70.0). CTA head: 1. No intracranial large vessel occlusion is identified. 2. Intracranial atherosclerotic disease with multifocal stenoses, most notably as follows. 3. Moderate/severe stenosis within a superior division mid M2 right MCA vessel. 4. Moderate stenosis within the A4 left anterior cerebral artery. 5. Moderate stenosis of the paraclinoid right ICA. Electronically Signed   By: Kellie Simmering D.O.   On: 03/05/2021 15:10   DG Chest 2 View  Result Date: 02/26/2021 CLINICAL DATA:  Suspected sepsis. Congestion started 1 month ago. Cough. EXAM: CHEST - 2 VIEW COMPARISON:  02/25/2021 FINDINGS: Low lung volumes. Heart size is accentuated by technique and low lung  volumes. Minimal bibasilar atelectasis. No consolidations. No evidence for pulmonary edema. IMPRESSION: Low lung volumes.  No evidence for acute  abnormality. Electronically Signed   By: Nolon Nations M.D.   On: 02/26/2021 13:52   DG Chest 2 View  Result Date: 02/26/2021 CLINICAL DATA:  Cough. EXAM: CHEST - 2 VIEW COMPARISON:  May 19, 2020. FINDINGS: The heart size and mediastinal contours are within normal limits. Both lungs are clear. Stable hiatal hernia. The visualized skeletal structures are unremarkable. IMPRESSION: No active cardiopulmonary disease.  Stable hiatal hernia. Electronically Signed   By: Marijo Conception M.D.   On: 02/26/2021 11:48   MR BRAIN WO CONTRAST  Result Date: 03/05/2021 CLINICAL DATA:  Left-sided weakness EXAM: MRI HEAD WITHOUT CONTRAST TECHNIQUE: Multiplanar, multiecho pulse sequences of the brain and surrounding structures were obtained without intravenous contrast. COMPARISON:  None. FINDINGS: Brain: Multifocal acute cortical ischemia in the anterior right MCA territory. Small focus of acute/subacute ischemia at the left caudate head. No acute or chronic hemorrhage. There is multifocal hyperintense T2-weighted signal within the white matter. Parenchymal volume and CSF spaces are normal. The midline structures are normal. Vascular: Major flow voids are preserved. Skull and upper cervical spine: Normal calvarium and skull base. Visualized upper cervical spine and soft tissues are normal. Sinuses/Orbits:No paranasal sinus fluid levels or advanced mucosal thickening. No mastoid or middle ear effusion. Normal orbits. IMPRESSION: 1. Multifocal acute/early subacute cortical ischemia in the anterior right MCA territory. No hemorrhage or mass effect. 2. Small focus of acute/early subacute ischemia at the left caudate head. Electronically Signed   By: Ulyses Jarred M.D.   On: 03/05/2021 21:47   EP PPM/ICD IMPLANT  Result Date: 03/13/2021 SURGEON:  Allegra Lai, MD   PREPROCEDURE  DIAGNOSIS:  Cryptogenic Stroke   POSTPROCEDURE DIAGNOSIS:  Cryptogenic Stroke    PROCEDURES:  1. Implantable loop recorder implantation   INTRODUCTION:  David [name] is a 81 y.o. male with a history of unexplained stroke who presents today for implantable loop implantation.  The patient has had a cryptogenic stroke.  Despite an extensive workup  by neurology, no reversible causes have been identified.  he has worn telemetry during which he did not have arrhythmias.  There is significant concern for possible atrial fibrillation as the cause for the patients stroke.  The patient therefore presents today for implantable loop implantation.   DESCRIPTION OF PROCEDURE:  Informed written consent was obtained, and the patient was brought to the electrophysiology lab in a fasting state.  The patient required no sedation for the procedure today.  Mapping over the patient's chest was performed by the EP lab staff to identify the area where electrograms were most prominent for ILR recording.  This area was found to be the left parasternal region over the 3rd-4th intercostal space. The patients left chest was therefore prepped and draped in the usual sterile fashion by the EP lab staff. The skin overlying the left parasternal region was infiltrated with lidocaine for local analgesia.  A 0.5-cm incision was made over the left parasternal region over the 3rd intercostal space.  A subcutaneous ILR pocket was fashioned using a combination of sharp and blunt dissection.  A Medtronic Reveal Linq model [place] [id number] implantable loop recorder was then placed into the pocket  R waves were very prominent and measured 0.27m. EBL<1 ml.  Steri- Strips and a sterile dressing were then applied.  There were no early apparent complications.   CONCLUSIONS:  1. Successful implantation of a Medtronic Reveal LINQ implantable loop recorder for cryptogenic stroke  2. No early apparent complications.   IR INTRAVSC STENT CERV CAROTID  W/EMB-PROT MOD SED  Result Date: 03/08/2021 INDICATION: 81year old male with past medical history significant for coronary artery have anemia, hypertension, remote history of prostate cancer and renal oncocytoma. He presented to emergency department with left-sided weakness. NIHSS at presentation 8; baseline modified Rankin scale 1. His last known well was unclear, probably 10 a.m. on March 05, 2021. Head CT showed large acute territory infarct or hemorrhage. CT angiogram of the head and neck showed critical stenosis of the cervical right ICA with suspected intraluminal thrombus and no intracranial large vessel occlusion. Given acute neurological symptoms with possible acutely thrombosed right carotid plaque, decision was made to proceed with an emergency right carotid stent. EXAM: ULTRASOUND-GUIDED VASCULAR ACCESS DIAGNOSTIC CEREBRAL ANGIOGRAM RIGHT CAROTID STENTING WITH CEREBRAL PROTECTION DEVICE COMPARISON:  CT/CT angiogram of the head and neck March 05, 2021. MEDICATIONS: Intravenous Cangrelor bolus and drip. ANESTHESIA/SEDATION: Procedure was performed under monitored anesthesia care (MAC). CONTRAST:  100 mL of Omnipaque 300 mg per FLUOROSCOPY TIME:  Fluoroscopy Time: 10 minutes 48 seconds (430 mGy). COMPLICATIONS: None immediate. TECHNIQUE: Informed written consent was obtained from the patient after a thorough discussion of the procedural risks, benefits and alternatives. All questions were addressed. Maximal Sterile Barrier Technique was utilized including caps, mask, sterile gowns, sterile gloves, sterile drape, hand hygiene and skin antiseptic. A timeout was performed prior to the initiation of the procedure. The right groin was prepped and draped in the usual sterile fashion. The right groin area was infiltrated with lidocaine 1% at the level of the right common femoral artery, under ultrasound guidance. Using a micropuncture kit and the modified Seldinger technique, access was gained to the right  common femoral artery and an 8 French sheath was placed. Real-time ultrasound guidance was utilized for vascular access including the acquisition of a permanent ultrasound image documenting patency of the accessed vessel. Under fluoroscopy, an 8 [place] balloon guide catheter was navigated over a 6 [place] 2 catheter and a 0.035" Terumo  Glidewire into the aortic arch. The catheter was placed into the right common carotid artery. The diagnostic catheter was removed. Right common carotid artery angiogram with right anterior oblique view of the neck was obtained. Then, angiograms with frontal and lateral views of the were obtained. FINDINGS: 1. The right common femoral artery is patent, with adequate caliber for vascular access. 2. Prominent atherosclerotic changes at the right carotid bifurcation with ulcerated plaque as well as filling defect projecting in the vein, suspicious for acute thrombus. Findings result in approximately 85% stenosis of the right ICA. 3. Brisk contrast opacification of the right MCA and bilateral ACA vascular tree without evidence of intracranial arterial occlusion. PROCEDURE: A right common carotid artery angiogram was obtained with right anterior oblique view in utilized as roadmap. Then, a 2.5-4.8 mm Emboshield nav 6 cerebral protection device was navigated and deployed into the distal cervical segment of the right ICA. Then, a 6-8 x 40 mm XACT carotid stent was navigated into the right carotid bifurcation. The guiding catheter balloon was inflated and the catheter was connected to an aspiration pump. The stent was then deployed spanning the distal right common carotid artery and proximal cervical right internal carotid artery under constant aspiration. The guiding catheter balloon was deflated and aspiration was stopped. Clear blood return was noted from the guide catheter. The cerebral protection device was subsequently recaptured. Right common carotid artery angiograms  with right anterior oblique view of the neck showed adequate stent position with resolution of stenosis. Angiograms with frontal, lateral and right anterior oblique views of the head showed no evidence of thromboembolic complication. Delayed angiograms with right anterior oblique view of the neck showed patent stent with no evidence of in stent left formation. The catheter was subsequently withdrawn. A right common femoral artery angiogram was obtained in right anterior oblique view of via sheath side port contrast injection. The puncture is at the level of the mid right common femoral artery which has normal caliber with no evidence of significant atherosclerotic disease. The right common femoral artery sheath was exchanged over the wire for a Perclose pro style which was utilized for access closure. Immediate hemostasis was achieved. IMPRESSION: 1. Successful cervical right carotid stenting with cerebral protection device for treatment of acutely symptomatic right carotid severe stenosis. 2. No evidence of thromboembolic complication. PLAN: 1. Dual anti-platelet with aspirin and Brilinta. 2. Follow-up carotid duplex in 3 months. Electronically Signed   By: [name] M.D.   On: 03/08/2021 10:19   IR US Guide Vasc Access Right  Result Date: 03/08/2021 INDICATION: 81 year old male with past medical history significant for coronary artery have anemia, hypertension, remote history of prostate cancer and renal oncocytoma. He presented to emergency department with left-sided weakness. NIHSS at presentation 8; baseline modified Rankin scale 1. His last known well was unclear, probably 10 a.m. on March 05, 2021. Head CT showed large acute territory infarct or hemorrhage. CT angiogram of the head and neck showed critical stenosis of the cervical right ICA with suspected intraluminal thrombus and no intracranial large vessel occlusion. Given acute neurological symptoms with possible acutely thrombosed  right carotid plaque, decision was made to proceed with an emergency right carotid stent. EXAM: ULTRASOUND-GUIDED VASCULAR ACCESS DIAGNOSTIC CEREBRAL ANGIOGRAM RIGHT CAROTID STENTING WITH CEREBRAL PROTECTION DEVICE COMPARISON:  CT/CT angiogram of the head and neck March 05, 2021. MEDICATIONS: Intravenous Cangrelor bolus and drip. ANESTHESIA/SEDATION: Procedure was performed under monitored anesthesia care (MAC). CONTRAST:  100 mL of Omnipaque 300 mg per FLUOROSCOPY  TIME:  Fluoroscopy Time: 10 minutes 48 seconds (430 mGy). COMPLICATIONS: None immediate. TECHNIQUE: Informed written consent was obtained from the patient after a thorough discussion of the procedural risks, benefits and alternatives. All questions were addressed. Maximal Sterile Barrier Technique was utilized including caps, mask, sterile gowns, sterile gloves, sterile drape, hand hygiene and skin antiseptic. A timeout was performed prior to the initiation of the procedure. The right groin was prepped and draped in the usual sterile fashion. The right groin area was infiltrated with lidocaine 1% at the level of the right common femoral artery, under ultrasound guidance. Using a micropuncture kit and the modified Seldinger technique, access was gained to the right common femoral artery and an 8 French sheath was placed. Real-time ultrasound guidance was utilized for vascular access including the acquisition of a permanent ultrasound image documenting patency of the accessed vessel. Under fluoroscopy, an 8 [place] Walrus balloon guide catheter was navigated over a 6 [place] Berenstein 2 catheter and a 0.035" Terumo Glidewire into the aortic arch. The catheter was placed into the right common carotid artery. The diagnostic catheter was removed. Right common carotid artery angiogram with right anterior oblique view of the neck was obtained. Then, angiograms with frontal and lateral views of the were obtained. FINDINGS: 1. The right common femoral artery is  patent, with adequate caliber for vascular access. 2. Prominent atherosclerotic changes at the right carotid bifurcation with ulcerated plaque as well as filling defect projecting in the vein, suspicious for acute thrombus. Findings result in approximately 85% stenosis of the right ICA. 3. Brisk contrast opacification of the right MCA and bilateral ACA vascular tree without evidence of intracranial arterial occlusion. PROCEDURE: A right common carotid artery angiogram was obtained with right anterior oblique view in utilized as roadmap. Then, a 2.5-4.8 mm Emboshield nav 6 cerebral protection device was navigated and deployed into the distal cervical segment of the right ICA. Then, a 6-8 x 40 mm XACT carotid stent was navigated into the right carotid bifurcation. The guiding catheter balloon was inflated and the catheter was connected to an aspiration pump. The stent was then deployed spanning the distal right common carotid artery and proximal cervical right internal carotid artery under constant aspiration. The guiding catheter balloon was deflated and aspiration was stopped. Clear blood return was noted from the guide catheter. The cerebral protection device was subsequently recaptured. Right common carotid artery angiograms with right anterior oblique view of the neck showed adequate stent position with resolution of stenosis. Angiograms with frontal, lateral and right anterior oblique views of the head showed no evidence of thromboembolic complication. Delayed angiograms with right anterior oblique view of the neck showed patent stent with no evidence of in stent left formation. The catheter was subsequently withdrawn. A right common femoral artery angiogram was obtained in right anterior oblique view of via sheath side port contrast injection. The puncture is at the level of the mid right common femoral artery which has normal caliber with no evidence of significant atherosclerotic disease. The right common  femoral artery sheath was exchanged over the wire for a Perclose pro style which was utilized for access closure. Immediate hemostasis was achieved. IMPRESSION: 1. Successful cervical right carotid stenting with cerebral protection device for treatment of acutely symptomatic right carotid severe stenosis. 2. No evidence of thromboembolic complication. PLAN: 1. Dual anti-platelet with aspirin and Brilinta. 2. Follow-up carotid duplex in 3 months. Electronically Signed   By: [name] M.D.   On: 03/08/2021 10:19  DG CHEST PORT 1 VIEW  Result Date: 03/07/2021 CLINICAL DATA:  Respiratory abnormalities. EXAM: PORTABLE CHEST 1 VIEW COMPARISON:  Radiograph 02/26/2021 FINDINGS: Lung volumes are low. Stable heart size and mediastinal contours. Patchy opacity in both lower lung zones. Mild additional patchy opacity in the right suprahilar lung. Vascular congestion. No pleural effusion. Anti lordotic positioning. IMPRESSION: 1. Low lung volumes with patchy bibasilar and right suprahilar opacities, may represent multifocal pneumonia, particularly at the right lung base. 2. Vascular congestion. Electronically Signed   By: Keith Rake M.D.   On: 03/07/2021 17:35   DG Swallowing Func-Speech Pathology  Result Date: 03/10/2021 Table formatting from the original result was not included. Objective Swallowing Evaluation: Type of Study: MBS-Modified Barium Swallow Study  Patient Details Name: David [name] MRN: [id number] Date of Birth: [birth year] Today's Date: 03/23/2021 Time: SLP Start Time (ACUTE ONLY): 0900 -SLP Stop Time (ACUTE ONLY): 0930 SLP Time Calculation (min) (ACUTE ONLY): 30 min Past Medical History: Past Medical History: Diagnosis Date  Allergy   Arthritis   gen.  and left hip  BPH (benign prostatic hypertrophy)   Coronary artery disease   GERD (gastroesophageal reflux disease)   occasional  History of kidney stones   2007  Hyperlipidemia   Hypertension   Kidney tumor   LBP (low back pain)  06/2005  L5 radicular symptoms; MRI of LS spine severe spondylosis at L4-L5 with central cancal stenosis   Nocturia   Organic impotence   Prostate cancer (Interior) 10/2011  (Low grade) [facility] - Dr. Junious Silk  Pyelonephritis 03/05/2013  Renal oncocytoma of right kidney 01/23/2013  As 3.2 x 3.5 cm enhancing exophytic mass projecting off the lower pole. This is consistent with a renal cell carcinoma. No retroperitoneal lymphadenopathy.   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  Wears glasses  Past Surgical History: Past Surgical History: Procedure Laterality Date  COLONOSCOPY  02/15/2006, 2014  Internal hemorrhoids (Dr [name]), last in 2014  [place] Right 1980  IR ANGIO INTRA EXTRACRAN SEL INTERNAL CAROTID UNI R MOD SED  03/05/2021  IR INTRAVSC STENT CERV CAROTID W/EMB-PROT MOD SED INCL ANGIO  03/05/2021  IR US GUIDE VASC ACCESS RIGHT  03/05/2021  LYMPHADENECTOMY Bilateral 02/15/2013  Procedure: LYMPHADENECTOMY WITH INDOCYANINE GREEN DYE INJECTION;  Surgeon: [name], MD;  Location: [facility];  Service: Urology;  Laterality: Bilateral;  PROSTATE BIOPSY N/A 12/18/2012  Procedure: BIOPSY TRANSRECTAL ULTRASONIC PROSTATE (TUBP);  Surgeon: [name], MD;  [facility];  Service: Urology;  Laterality: N/A;  PROSTATE BIOPSY  12/05/11  gleason 6, 3/12 cores  RADIOLOGY WITH ANESTHESIA N/A 03/05/2021  Procedure: IR WITH ANESTHESIA;  Surgeon: Radiologist, Medication, MD;  Location: [place];  Service: Radiology;  Laterality: N/A;  REPAIR UMBILICAL AND VENTRAL HERNIA'S W/ MESH  08-21-2009  RIGHT/LEFT HEART CATH AND CORONARY ANGIOGRAPHY N/A 08/06/2019  Procedure: RIGHT/LEFT HEART CATH AND CORONARY ANGIOGRAPHY;  Surgeon: [name], Peter M, MD;  Location: [place] [facility];  Service: Cardiovascular;  Laterality: N/A;  ROBOT ASSISTED LAPAROSCOPIC RADICAL PROSTATECTOMY N/A 02/15/2013  Procedure: ROBOTIC ASSISTED LAPAROSCOPIC RADICAL PROSTATECTOMY;  Surgeon: [name], MD;  Location: [facility];   Service: Urology;  Laterality: N/A;  ROBOTIC ASSITED PARTIAL NEPHRECTOMY Right 02/15/2013  Procedure: ROBOTIC ASSITED PARTIAL NEPHRECTOMY;  Surgeon: [name], MD;  Location: [facility];  Service: Urology;  Laterality: Right;  SPERMATOCELECTOMY Left 12/18/2012  Procedure: LEFT SPERMATOCELECTOMY;  Surgeon: [name], MD;  Location: [facility];  Service: Urology;  Laterality: Left;  TONSILLECTOMY AND  ADENOIDECTOMY  as child  TOTAL KNEE ARTHROPLASTY Left 05/26/2020  Procedure: TOTAL KNEE ARTHROPLASTY;  Surgeon: Paralee Cancel, MD;  Location: [facility];  Service: Orthopedics;  Laterality: Left;  70 mins  TRANSURETHRAL RESECTION OF PROSTATE  AGE 57 HPI: Pt is a 81 y/o male presented to ED on 03/05/21 for L sided weakness. Recent flue dx. CTA showed R ICA occlusion. S/p cerebral angiogram and R carotid stenting on 1/6. MRI showed multifocal acute/early subacute cortical ischemia in anterior R MCA and small focus of acute/early subacute ischemia at L caudate head. Pt evaluated by SLP for dysarthria, MD then ordered swallow eval due to persistent pna and complaint of coughing while eating. PMH: HTN, CAD, prostate cancer  No data recorded  Recommendations for follow up therapy are one component of a multi-disciplinary discharge planning process, led by the attending physician.  Recommendations may be updated based on patient status, additional functional criteria and insurance authorization. Assessment / Plan / Recommendation Clinical Impressions 03/10/2021 Clinical Impression  Pt demonstrates a moderate to severe pharyngeal dysphagia, likely acute on chronic in nature. Pt demonstrates structural changes in pharynx impacting function that are likely related to curvature of spinal column. Pt has initiation of swallow at pyriform sinuses, leading to instances of sensed aspiration of thin liquids before the swallow without expectoration (PAS 7). Pt additionally has moderate residue with liquid and solid  boluses due to restricted bolus flow (decreased epiglottic deflection and UES opening) appearing secondary to curvature of cervical spine. SLP able to cue pt to complete a chin tuck which reduced pyriform residue and also complete preventative throat clears and second swallow to clear nectar thick penetrate. Pt fatigued significantly during exam and generalized weakness is increasing pts risk at this time. Question if a dys 3 (mech soft diet) and nectar thick liqudis will be tolerated despite efforts. Will attempt modified diet and strengthing interventions. Pt recommended to f/u with AIR. SLP Visit Diagnosis Dysphagia, pharyngeal phase (R13.13) Attention and concentration deficit following -- Frontal lobe and executive function deficit following -- Impact on safety and function Moderate aspiration risk;Risk for inadequate nutrition/hydration   Treatment Recommendations 03/16/2021 Treatment Recommendations Therapy as outlined in treatment plan below   Prognosis 03/03/2021 Prognosis for Safe Diet Advancement Fair Barriers to Reach Goals -- Barriers/Prognosis Comment -- Diet Recommendations 03/21/2021 SLP Diet Recommendations Dysphagia 3 (Mech soft) solids;Nectar thick liquid Liquid Administration via Cup;Straw Medication Administration Crushed with puree Compensations Slow rate;Small sips/bites;Follow solids with liquid;Clear throat intermittently;Effortful swallow;Chin tuck;Multiple dry swallows after each bite/sip Postural Changes Remain semi-upright after after feeds/meals (Comment);Seated upright at 90 degrees   Other Recommendations 03/12/2021 Recommended Consults -- Oral Care Recommendations Oral care BID Other Recommendations Have oral suction available Follow Up Recommendations Acute inpatient rehab (3hours/day) Assistance recommended at discharge -- Functional Status Assessment Patient has had a recent decline in their functional status and demonstrates the ability to make significant improvements in function in  a reasonable and predictable amount of time. Frequency and Duration  03/04/2021 Speech Therapy Frequency (ACUTE ONLY) min 2x/week Treatment Duration 2 weeks   Oral Phase 03/10/2021 Oral Phase Impaired Oral - Pudding Teaspoon -- Oral - Pudding Cup -- Oral - Honey Teaspoon -- Oral - Honey Cup -- Oral - Nectar Teaspoon -- Oral - Nectar Cup Lingual/palatal residue Oral - Nectar Straw Lingual/palatal residue Oral - Thin Teaspoon Lingual/palatal residue Oral - Thin Cup Lingual/palatal residue Oral - Thin Straw Lingual/palatal residue Oral - Puree Lingual/palatal residue Oral - Mech Soft Lingual/palatal residue Oral - Regular --  Oral - Multi-Consistency -- Oral - Pill -- Oral Phase - Comment --  Pharyngeal Phase 03/27/2021 Pharyngeal Phase Impaired Pharyngeal- Pudding Teaspoon -- Pharyngeal -- Pharyngeal- Pudding Cup -- Pharyngeal -- Pharyngeal- Honey Teaspoon -- Pharyngeal -- Pharyngeal- Honey Cup -- Pharyngeal -- Pharyngeal- Nectar Teaspoon -- Pharyngeal -- Pharyngeal- Nectar Cup Delayed swallow initiation-pyriform sinuses;Reduced epiglottic inversion;Penetration/Apiration after swallow;Pharyngeal residue - valleculae;Pharyngeal residue - pyriform;Compensatory strategies attempted (with notebox) Pharyngeal Material enters airway, CONTACTS cords and not ejected out;Material enters airway, remains ABOVE vocal cords and not ejected out;Material does not enter airway Pharyngeal- Nectar Straw Delayed swallow initiation-pyriform sinuses;Reduced epiglottic inversion;Penetration/Apiration after swallow;Pharyngeal residue - valleculae;Pharyngeal residue - pyriform;Compensatory strategies attempted (with notebox) Pharyngeal Material enters airway, CONTACTS cords and not ejected out;Material enters airway, remains ABOVE vocal cords and not ejected out;Material does not enter airway Pharyngeal- Thin Teaspoon Delayed swallow initiation-pyriform sinuses;Reduced epiglottic inversion;Penetration/Aspiration before swallow;Trace aspiration  Pharyngeal Material enters airway, passes BELOW cords and not ejected out despite cough attempt by patient Pharyngeal- Thin Cup Delayed swallow initiation-pyriform sinuses;Reduced epiglottic inversion;Penetration/Apiration after swallow;Pharyngeal residue - valleculae;Pharyngeal residue - pyriform;Compensatory strategies attempted (with notebox) Pharyngeal Material enters airway, CONTACTS cords and not ejected out;Material enters airway, remains ABOVE vocal cords and not ejected out;Material does not enter airway Pharyngeal- Thin Straw Delayed swallow initiation-pyriform sinuses;Reduced epiglottic inversion;Penetration/Apiration after swallow;Pharyngeal residue - valleculae;Pharyngeal residue - pyriform;Compensatory strategies attempted (with notebox) Pharyngeal Material enters airway, CONTACTS cords and not ejected out;Material enters airway, remains ABOVE vocal cords and not ejected out;Material does not enter airway Pharyngeal- Puree Delayed swallow initiation-pyriform sinuses;Reduced epiglottic inversion;Pharyngeal residue - valleculae;Pharyngeal residue - pyriform;Compensatory strategies attempted (with notebox) Pharyngeal -- Pharyngeal- Mechanical Soft Delayed swallow initiation-pyriform sinuses;Reduced epiglottic inversion;Pharyngeal residue - valleculae;Pharyngeal residue - pyriform;Compensatory strategies attempted (with notebox) Pharyngeal -- Pharyngeal- Regular -- Pharyngeal -- Pharyngeal- Multi-consistency -- Pharyngeal -- Pharyngeal- Pill -- Pharyngeal -- Pharyngeal Comment --  Cervical Esophageal Phase  03/22/2021 Cervical Esophageal Phase Impaired Pudding Teaspoon -- Pudding Cup -- Honey Teaspoon -- Honey Cup -- Nectar Teaspoon -- Nectar Cup Reduced cricopharyngeal relaxation;Esophageal backflow into cervical esophagus;Esophageal backflow into the pharynx Nectar Straw Reduced cricopharyngeal relaxation;Esophageal backflow into cervical esophagus;Esophageal backflow into the pharynx Thin Teaspoon --  Thin Cup -- Thin Straw -- Puree -- Mechanical Soft -- Regular -- Multi-consistency -- Pill -- Cervical Esophageal Comment see impression DeBlois, Katherene Ponto 03/21/2021, 10:07 AM                     ECHOCARDIOGRAM COMPLETE  Result Date: 03/06/2021    ECHOCARDIOGRAM REPORT   Patient Name:   David [name] Date of Exam: 03/06/2021 Medical Rec #:  [id number]         Height:       72.0 in Accession #:    [id number]        Weight:       210.1 lb Date of Birth:  October 26, 1940          BSA:          2.176 m Patient Age:    54 years          BP:           143/66 mmHg Patient Gender: M                 HR:           93 bpm. Exam Location:  Inpatient Procedure: 2D Echo, Cardiac Doppler, Color Doppler and Intracardiac  Opacification Agent Indications:    Stroke  History:        Patient has prior history of Echocardiogram examinations, most                 recent 06/13/2019.  Sonographer:    Clayton Lefort RDCS (AE) Referring Phys: [id number] Derek Jack  Sonographer Comments: Technically difficult study due to poor echo windows and no subcostal window. Image acquisition challenging due to respiratory motion. Patient coughing throughout test. IMPRESSIONS  1. Left ventricular ejection fraction, by estimation, is 65 to 70%. The left ventricle has normal function. The left ventricle has no regional wall motion abnormalities. There is moderate left ventricular hypertrophy. Left ventricular diastolic parameters are indeterminate.  2. Right ventricular systolic function is normal. The right ventricular size is normal.  3. The mitral valve is normal in structure. No evidence of mitral valve regurgitation. No evidence of mitral stenosis.  4. The aortic valve is normal in structure. Aortic valve regurgitation is not visualized. No aortic stenosis is present.  5. The inferior vena cava is normal in size with greater than 50% respiratory variability, suggesting right atrial pressure of 3 mmHg. Comparison(s): Prior ascending aorta  42-43 mm. Unable to visual in current study. Conclusion(s)/Recommendation(s): No intracardiac source of embolism detected on this transthoracic study. Consider a transesophageal echocardiogram to exclude cardiac source of embolism if clinically indicated. FINDINGS  Left Ventricle: Left ventricular ejection fraction, by estimation, is 65 to 70%. The left ventricle has normal function. The left ventricle has no regional wall motion abnormalities. The left ventricular internal cavity size was normal in size. There is  moderate left ventricular hypertrophy. Left ventricular diastolic parameters are indeterminate. Right Ventricle: The right ventricular size is normal. No increase in right ventricular wall thickness. Right ventricular systolic function is normal. Left Atrium: Left atrial size was normal in size. Right Atrium: Right atrial size was normal in size. Pericardium: There is no evidence of pericardial effusion. Mitral Valve: The mitral valve is normal in structure. No evidence of mitral valve regurgitation. No evidence of mitral valve stenosis. Tricuspid Valve: The tricuspid valve is normal in structure. Tricuspid valve regurgitation is not demonstrated. No evidence of tricuspid stenosis. Aortic Valve: The aortic valve is normal in structure. Aortic valve regurgitation is not visualized. No aortic stenosis is present. Aortic valve mean gradient measures 7.0 mmHg. Aortic valve peak gradient measures 14.1 mmHg. Aortic valve area, by VTI measures 3.49 cm. Pulmonic Valve: The pulmonic valve was normal in structure. Pulmonic valve regurgitation is not visualized. No evidence of pulmonic stenosis. Aorta: The aortic root is normal in size and structure. Venous: The inferior vena cava is normal in size with greater than 50% respiratory variability, suggesting right atrial pressure of 3 mmHg. IAS/Shunts: No atrial level shunt detected by color flow Doppler.  LEFT VENTRICLE PLAX 2D LVIDd:         4.70 cm LVIDs:          2.90 cm LV PW:         1.60 cm LV IVS:        1.80 cm LVOT diam:     2.30 cm LV SV:         108 LV SV Index:   49 LVOT Area:     4.15 cm  RIGHT VENTRICLE RV S prime:     17.40 cm/s TAPSE (M-mode): 2.4 cm LEFT ATRIUM           Index LA diam:  1.50 cm 0.69 cm/m LA Vol (A2C): 36.2 ml 16.64 ml/m LA Vol (A4C): 35.1 ml 16.13 ml/m  AORTIC VALVE AV Area (Vmax):    3.14 cm AV Area (Vmean):   3.59 cm AV Area (VTI):     3.49 cm AV Vmax:           188.00 cm/s AV Vmean:          126.000 cm/s AV VTI:            0.308 m AV Peak Grad:      14.1 mmHg AV Mean Grad:      7.0 mmHg LVOT Vmax:         142.00 cm/s LVOT Vmean:        109.000 cm/s LVOT VTI:          0.259 m LVOT/AV VTI ratio: 0.84  AORTA Ao Asc diam: 3.70 cm MITRAL VALVE MV Area (PHT): 2.87 cm     SHUNTS MV Decel Time: 264 msec     Systemic VTI:  0.26 m MV E velocity: 92.40 cm/s   Systemic Diam: 2.30 cm MV A velocity: 111.00 cm/s MV E/A ratio:  0.83 Candee Furbish MD Electronically signed by Candee Furbish MD Signature Date/Time: 03/06/2021/2:18:39 PM    Final    US THYROID  Result Date: 03/08/2021 CLINICAL DATA:  Incidental on CT. EXAM: THYROID ULTRASOUND TECHNIQUE: Ultrasound examination of the thyroid gland and adjacent soft tissues was performed. COMPARISON:  03/05/2021, 02/27/2020 FINDINGS: Parenchymal Echotexture: Mildly heterogenous Isthmus: 0.6 cm, previously 1.3 cm Right lobe: 4.2 x 1.4 x 1.7 cm, previously 4.5 x 2.3 x 1.8 cm Left lobe: 3.9 x 1.8 x 1.6 cm, previously 4.8 x 1.8 x 2.1 cm _________________________________________________________ Estimated total number of nodules >/= 1 cm: 1 Number of spongiform nodules >/=  2 cm not described below (TR1): 0 Number of mixed cystic and solid nodules >/= 1.5 cm not described below ([place]): 0 _________________________________________________________ Nodule # 1: Prior biopsy: No Location: Right; Superior Maximum size: 0.7 cm; Other 2 dimensions: 0.6 x 0.5 cm, previously, 0.6 x 0.6 x 0.5 cm Composition: cannot  determine (2) Echogenicity: hypoechoic (2) Shape: not taller-than-wide (0) Margins: ill-defined (0) Echogenic foci: peripheral calcifications (2) ACR TI-RADS total points: 6. ACR TI-RADS risk category:  TR4 (4-6 points). Significant change in size (>/= 20% in two dimensions and minimal increase of 2 mm): No Change in features: No Change in ACR TI-RADS risk category: No ACR TI-RADS recommendations: Given size (<0.9 cm) and appearance, this nodule does NOT meet TI-RADS criteria for biopsy or dedicated follow-up. _________________________________________________________ Nodule # 2: Prior biopsy: No Location: Right; Inferior Maximum size: 1.1 cm; Other 2 dimensions: 1.0 x 0.9 cm, previously, 1.1 x 1.0 x 0.9 cm Composition: solid/almost completely solid (2) Echogenicity: hypoechoic (2) Shape: not taller-than-wide (0) Margins: smooth (0) Echogenic foci: none (0) ACR TI-RADS total points: 4. ACR TI-RADS risk category:  TR4 (4-6 points). Significant change in size (>/= 20% in two dimensions and minimal increase of 2 mm): No Change in features: No Change in ACR TI-RADS risk category: No ACR TI-RADS recommendations: *Given size (>/= 1 - 1.4 cm) and appearance, a follow-up ultrasound in 1 year should be considered based on TI-RADS criteria. _________________________________________________________ No cervical lymphadenopathy identified. IMPRESSION: 1. The right paratracheal nodule described on recent CT head neck is not visualized sonographically. In retrospect, this CT finding is similar to 06/13/2013 comparison chest CT and appears extrathyroidal. 2. Unchanged appearance of right inferior solid thyroid nodule (labeled 2,  1.1 cm) which again meets criteria (TI-RADS category 4) for 1 year ultrasound surveillance. The above is in keeping with the ACR TI-RADS recommendations - J Am Coll Radiol 2017;14:587-595. Ruthann Cancer, MD [facility] Radiology Electronically Signed   By:  Ruthann Cancer M.D.   On: 03/08/2021 08:19   CT HEAD CODE STROKE WO CONTRAST  Result Date: 03/05/2021 CLINICAL DATA:  Code stroke.  Left-sided weakness EXAM: CT HEAD WITHOUT CONTRAST TECHNIQUE: Contiguous axial images were obtained from the base of the skull through the vertex without intravenous contrast. COMPARISON:  None. FINDINGS: Brain: There is no evidence of acute intracranial hemorrhage, extra-axial fluid collection, or acute infarct. Parenchymal volume is normal. The ventricles are normal in size. There is no mass lesion. There is no midline shift. Vascular: No dense vessel is seen. Skull: Normal. Negative for fracture or focal lesion. Sinuses/Orbits: There is layering fluid in the maxillary and sphenoid sinuses. Bilateral lens implants are in place. The globes and orbits are otherwise unremarkable. Other: None. ASPECTS [facility] Stroke Program Early CT Score) - Ganglionic level infarction (caudate, lentiform nuclei, internal capsule, insula, M1-M3 cortex): Seven - Supraganglionic infarction (M4-M6 cortex): 3 Total score (0-10 with 10 being normal): 10 IMPRESSION: 1. No acute intracranial pathology. 2. ASPECTS is 10 3. Layering fluid in the maxillary and sphenoid sinuses which can be seen with acute sinusitis in the correct clinical setting. These results were paged via AMION at the time of interpretation on 03/05/2021 at 2:29 pm to provider Dr Livia Snellen. Electronically Signed   By: Valetta Mole M.D.   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 He presented to emergency department with left-sided weakness. NIHSS at presentation 8; baseline modified Rankin scale 1. His last known well was unclear, probably 10 a.m. on March 05, 2021. Head CT showed large acute territory infarct  or hemorrhage. CT angiogram of the head and neck showed critical stenosis of the cervical right ICA with suspected intraluminal thrombus and no intracranial large vessel occlusion. Given acute neurological symptoms with possible acutely thrombosed right carotid plaque, decision was made to proceed with an emergency right carotid stent. EXAM: ULTRASOUND-GUIDED VASCULAR ACCESS DIAGNOSTIC CEREBRAL ANGIOGRAM RIGHT CAROTID STENTING WITH CEREBRAL PROTECTION DEVICE COMPARISON:  CT/CT angiogram of the head and neck March 05, 2021. MEDICATIONS: Intravenous Cangrelor bolus and drip. ANESTHESIA/SEDATION: Procedure was performed under monitored anesthesia care (MAC). CONTRAST:  100 mL of Omnipaque 300 mg per FLUOROSCOPY TIME:  Fluoroscopy Time: 10 minutes 48 seconds (430 mGy). COMPLICATIONS: None immediate. TECHNIQUE: Informed written consent was obtained from the patient after a thorough discussion of the procedural risks, benefits and alternatives. All questions were addressed. Maximal Sterile Barrier Technique was utilized including caps, mask, sterile gowns, sterile gloves, sterile drape, hand hygiene and skin antiseptic. A timeout was performed prior to the initiation of the procedure. The right groin was prepped and draped in the usual sterile fashion. The right groin area was infiltrated with lidocaine 1% at the level of the right common femoral artery, under ultrasound guidance. Using a micropuncture kit and the modified Seldinger technique, access was gained to the right common femoral artery and an 8 French sheath was placed. Real-time ultrasound guidance was utilized for vascular access including the acquisition of a permanent ultrasound image documenting patency of the accessed  vessel. Under fluoroscopy, an 8 [place] Walrus balloon guide catheter was navigated over a 6 [place] Berenstein 2 catheter and a 0.035" Terumo Glidewire into the aortic arch. The catheter was placed into the right common carotid artery. The  diagnostic catheter was removed. Right common carotid artery angiogram with right anterior oblique view of the neck was obtained. Then, angiograms with frontal and lateral views of the were obtained. FINDINGS: 1. The right common femoral artery is patent, with adequate caliber for vascular access. 2. Prominent atherosclerotic changes at the right carotid bifurcation with ulcerated plaque as well as filling defect projecting in the vein, suspicious for acute thrombus. Findings result in approximately 85% stenosis of the right ICA. 3. Brisk contrast opacification of the right MCA and bilateral ACA vascular tree without evidence of intracranial arterial occlusion. PROCEDURE: A right common carotid artery angiogram was obtained with right anterior oblique view in utilized as roadmap. Then, a 2.5-4.8 mm Emboshield nav 6 cerebral protection device was navigated and deployed into the distal cervical segment of the right ICA. Then, a 6-8 x 40 mm XACT carotid stent was navigated into the right carotid bifurcation. The guiding catheter balloon was inflated and the catheter was connected to an aspiration pump. The stent was then deployed spanning the distal right common carotid artery and proximal cervical right internal carotid artery under constant aspiration. The guiding catheter balloon was deflated and aspiration was stopped. Clear blood return was noted from the guide catheter. The cerebral protection device was subsequently recaptured. Right common carotid artery angiograms with right anterior oblique view of the neck showed adequate stent position with resolution of stenosis. Angiograms with frontal, lateral and right anterior oblique views of the head showed no evidence of thromboembolic complication. Delayed angiograms with right anterior oblique view of the neck showed patent stent with no evidence of in stent left formation. The catheter was subsequently withdrawn. A right common femoral artery angiogram was  obtained in right anterior oblique view of via sheath side port contrast injection. The puncture is at the level of the mid right common femoral artery which has normal caliber with no evidence of significant atherosclerotic disease. The right common femoral artery sheath was exchanged over the wire for a Perclose pro style which was utilized for access closure. Immediate hemostasis was achieved. IMPRESSION: 1. Successful cervical right carotid stenting with cerebral protection device for treatment of acutely symptomatic right carotid severe stenosis. 2. No evidence of thromboembolic complication. PLAN: 1. Dual anti-platelet with aspirin and Brilinta. 2. Follow-up carotid duplex in 3 months. Electronically Signed   By: [name] M.D.   On: 03/08/2021 10:19    Labs:  Basic Metabolic Panel: Recent Labs  Lab 03/05/21 1419 03/05/21 1420 03/06/21 0515 03/07/21 0137 03/08/21 0350 03/30/2021 0140 03/10/21 1016  NA 139 136 136 139 139 139 141  K 3.6 3.5 3.7 3.6 3.3* 3.2* 3.6  CL 112* 110 112* 113* 108 111 110  CO2  --  18* 18* 18* 22 20* 20*  GLUCOSE 85 86 98 125* 115* 119* 111*  BUN 26* 28* _0 CREATININE 1.20 1.26* 0.99 1.00 1.08 0.99 0.96  CALCIUM  --  10.1 9.8 9.5 9.4 9.1 9.7    CBC: Recent Labs  Lab 03/05/21 1420 03/06/21 0515 03/07/21 0137 03/08/21 0350 03/11/2021 0140  WBC 12.5*   < > 19.2* 18.3* 16.3*  NEUTROABS 9.0*  --   --  16.0* 13.8*  HGB 11.8*   < >  10.8* 11.0* 10.6*  HCT 35.7*   < > 32.4* 33.2* 32.4*  MCV 89.9   < > 88.3 88.5 88.8  PLT 208   < > 226 260 264   < > = values in this interval not displayed.    CBG: Recent Labs  Lab 03/05/21 1414  GLUCAP 88    Brief HPI:   David [name] is a 81 y.o. male who presented to [facility] emergency department on 03/05/2021 with left-sided weakness, dysarthria and visual neglect.  Imaging revealed right ICA occlusion.  Code IR was activated and he underwent diagnostic cerebral angiogram with  right carotid stenting.  Ticagrelor 90 mg twice daily and aspirin 81 mg daily were initiated.  MRI of the brain showed right frontal cortical small infarcts and left CR small infarct.  He developed persisted cough and leukocytosis.  He tested positive for influenza A and completed a course of Tamiflu.  Chest x-ray showed bilateral infiltrates concerning for multifocal pneumonia.  Unasyn was started for total of 7 days.  Telemetry showed frequent PVCs but no atrial fibrillation.  Dr. Stanford Breed recommended placement of loop recorder and this was performed on 1/10.   Hospital Course: David [name] was admitted to rehab 03/25/2021 for inpatient therapies to consist of PT, ST and OT at least three hours five days a week. Past admission physiatrist, therapy team and rehab RN have worked together to provide customized collaborative inpatient rehab.  Mild elevation of transaminases on 1/6. Tylenol held and LFTs montiored.  Mild oozing from loop recorder site after placement which resolved with pressure dressing. Unasyn continued. Complained of left lateral hip pain at night and exam consistent with left trochanteric bursitis. Lidoderm patch ordered.  On 1/12 he was noted to have poor oral intake andue to history of chronic kidney disease was administered normal saline 250 cc. Cough improved and repeat chest x-ray performed 03/16/2021 without acute change, but persistent basilar opacitites.  Unasyn discontinued on 1/18 and Augmentin orally for 7 days started. Poor PO intake noted and started on Boost on 1/19.  Developed left shoulder pain on and given Tramadol. Some degree of increasing fatigue versus lethargy on 1/23. Tramadol held. Megace started to improve appetite and PO intake. Interval CT head, CXR, IVFs and schedule HHN on 1/24. Repeat MBS performed on 1/24>>diet downgraded to dysphagia 1, honey thick consistencies, but did not feel patient could be consistent enough to protect airway. NPO status ordered, NGT  placement and gentle IVF hydration intiated. UA revealed positive nitrite and few bacteria. Ketones present.  Chest x-ray without acute changes. Bilateral airspace opacities as on previous exam. CT head without acute intracranial hemorrhage. Small infarcts identified on previous MRI not well seen. Sinusitis suspected. Cefdinir BID started. Patient removed his NGT later and rocephin was given IV in place of cefdinir dose. Follow-up AM labs revealed hypokalemia and supplemental dose was increased. He remained afebrile. WBC normal. Cortrak unable to pass hiatal hernia with fluorsoscopic  guidance. Meds, ice chips only PO. IR consulted and not able to place g-tube due to large hiatal hernia.  General surgery consulted.   Blood pressures were monitored on TID basis and Avapro increased to 75 mg daily on 03/17/2021 and to 150 mg on 1/24.  Rehab course: During patient's stay in rehab weekly team conferences were held to monitor patient's progress, set goals and discuss barriers to discharge. At admission, patient required mod assist for transfers, mobility and balance, total assist for locomotion.  He has had improvement  in activity tolerance, balance, postural control as well as ability to compensate for deficits. He has had improvement in functional use RUE/LUE  and RLE/LLE as well as improvement in awareness.       Disposition:  There are no questions and answers to display.         Diet:  Special Instructions:  No driving, alcohol consumption or tobacco use.    30-35 minutes were spent on discharge planning and discharge summary.  Allergies as of 03/11/2021       Reactions   Codeine Itching   Severe hiccups   Ace Inhibitors Other (See Comments)   REACTION: Cough   Amlodipine    LE swelling   Hydrocodone Hives, Itching   Hiccoughs   Percocet [oxycodone-acetaminophen]    Severe hiccups     Med Rec must be completed prior to using this [place]***        Signed: Barbie Banner 03/11/2021, 1:29 PM

## 2021-03-11 NOTE — Progress Notes (Signed)
Occupational Therapy Session Note  Patient Details  Name: David [name] MRN: [id number] Date of Birth: 1940-03-09  Today's Date: 03/11/2021 OT Individual Time: 1300-1330 OT Individual Time Calculation (min): 30 min    Short Term Goals: Week 1:  OT Short Term Goal 1 (Week 1): Pt will complete UB dressing at supervision level sitting unsupported. OT Short Term Goal 2 (Week 1): Pt will complete LB dressing with min assist sit to stand for all aspects. OT Short Term Goal 3 (Week 1): Pt will complete toilet transfers with min assist using the RW and 3:1 OT Short Term Goal 4 (Week 1): Pt will complete two grooming tasks in standing at the sink with min guard assist for at least 3 mins.  Skilled Therapeutic Interventions/Progress Updates:  Pt greeted seated in w/c  agreeable to OT intervention. Session focus on sit<>stands, LUE coordination, dynamic standing balance and decreasing overall caregiver burden.      Pt on 2L [place] during session with SpO2 >87%, education provided on appropriate SpO2 reading and pursed lip breathing technique with pt verbalizing understanding. Pt completed standing dynamic balance/LUE coordination task where pt instructed to remove weighted clothespins from line ( levels 1-3)with LUE, pt completed 3 trials of task with overall Supervision assist with unilateral support for 2 mins for the first 2 trials and 3 mins for last trial .pt completed sit<>stands throughout session with CGA with Rw.  pt left seated in w/c with all needs within reach and son present shaving pt.                    Therapy Documentation Precautions:  Precautions Precautions: Fall, Other (comment) Precaution Comments: L hemiparesis Restrictions Weight Bearing Restrictions: No  Pain: No pain reported during session    Therapy/Group: Individual Therapy  [name] 03/11/2021, 3:35 PM

## 2021-03-11 NOTE — Progress Notes (Signed)
Inpatient Rehabilitation Care Coordinator Assessment and Plan Patient Details  Name: David [name] MRN: [id number] Date of Birth: Dec 05, 1940  Today's Date: 03/11/2021  Hospital Problems: Principal Problem:   Acute right MCA stroke [facility])  Past Medical History:  Past Medical History:  Diagnosis Date   Allergy    Arthritis    gen.  and left hip   BPH (benign prostatic hypertrophy)    Coronary artery disease    GERD (gastroesophageal reflux disease)    occasional   History of kidney stones    2007   Hyperlipidemia    Hypertension    Kidney tumor    LBP (low back pain) 06/2005   L5 radicular symptoms; MRI of LS spine severe spondylosis at L4-L5 with central cancal stenosis    Nocturia    Organic impotence    Prostate cancer ([place]) 10/2011   (Low grade) [facility] - Dr. Junious Silk   Pyelonephritis 03/05/2013   Renal oncocytoma of right kidney 01/23/2013   As 3.2 x 3.5 cm enhancing exophytic mass projecting off the lower pole. This is consistent with a renal cell carcinoma. No retroperitoneal lymphadenopathy.    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   Wears glasses    Past Surgical History:  Past Surgical History:  Procedure Laterality Date   COLONOSCOPY  02/15/2006, 2014   Internal hemorrhoids (Dr [name]), last in 2014   [place] Right 1980   IR ANGIO INTRA EXTRACRAN SEL INTERNAL CAROTID UNI R MOD SED  03/05/2021   IR INTRAVSC STENT CERV CAROTID W/EMB-PROT MOD SED INCL ANGIO  03/05/2021   IR US GUIDE VASC ACCESS RIGHT  03/05/2021   LOOP RECORDER INSERTION N/A 03/19/2021   Procedure: LOOP RECORDER INSERTION;  Surgeon: Constance Haw, MD;  Location: [facility];  Service: Cardiovascular;  Laterality: N/A;   LYMPHADENECTOMY Bilateral 02/15/2013   Procedure: LYMPHADENECTOMY WITH INDOCYANINE GREEN DYE INJECTION;  Surgeon: [name], MD;  Location: [facility];  Service: Urology;  Laterality: Bilateral;   PROSTATE BIOPSY N/A 12/18/2012   Procedure: BIOPSY  TRANSRECTAL ULTRASONIC PROSTATE (TUBP);  Surgeon: [name], MD;  Location: [facility];  Service: Urology;  Laterality: N/A;   PROSTATE BIOPSY  12/05/11   gleason 6, 3/12 cores   RADIOLOGY WITH ANESTHESIA N/A 03/05/2021   Procedure: IR WITH ANESTHESIA;  Surgeon: Radiologist, Medication, MD;  Location: [facility];  Service: Radiology;  Laterality: N/A;   REPAIR UMBILICAL AND VENTRAL HERNIA'S W/ MESH  08-21-2009   RIGHT/LEFT HEART CATH AND CORONARY ANGIOGRAPHY N/A 08/06/2019   Procedure: RIGHT/LEFT HEART CATH AND CORONARY ANGIOGRAPHY;  Surgeon: [name], Peter M, MD;  Location: [facility];  Service: Cardiovascular;  Laterality: N/A;   ROBOT ASSISTED LAPAROSCOPIC RADICAL PROSTATECTOMY N/A 02/15/2013   Procedure: ROBOTIC ASSISTED LAPAROSCOPIC RADICAL PROSTATECTOMY;  Surgeon: [name], MD;  Location: [facility];  Service: Urology;  Laterality: N/A;   ROBOTIC ASSITED PARTIAL NEPHRECTOMY Right 02/15/2013   Procedure: ROBOTIC ASSITED PARTIAL NEPHRECTOMY;  Surgeon: [name], MD;  Location: [facility];  Service: Urology;  Laterality: Right;   SPERMATOCELECTOMY Left 12/18/2012   Procedure: LEFT SPERMATOCELECTOMY;  Surgeon: [name], MD;  [facility];  Service: Urology;  Laterality: Left;   TONSILLECTOMY AND ADENOIDECTOMY  as child   TOTAL KNEE ARTHROPLASTY Left 05/26/2020   Procedure: TOTAL KNEE ARTHROPLASTY;  Surgeon: Paralee Cancel, MD;  Location: [facility];  Service: Orthopedics;  Laterality: Left;  70 mins   TRANSURETHRAL RESECTION OF PROSTATE  AGE 81   Social History:  reports that he quit smoking about 40 years ago. His smoking use included cigarettes. He has a 2.50 pack-year smoking history. He has never used smokeless tobacco. He reports current alcohol use of about 1.0 standard drink per week. He reports that he does not currently use drugs after having used the following drugs: Flunitrazepam.  Family / Support Systems Marital  Status: Married Patient Roles: Spouse Spouse/Significant Other: Hydrologist Children: Manuela Schwartz (Daughter), Estate manager/land agent (Daughter) Anticipated Caregiver: spouse, daughter and other family members Ability/Limitations of Caregiver: Previous caregiver for spouse Caregiver Availability: 24/7 Family Dynamics: support from spouse, one daughter local, other daughter out of town  Social History Preferred language: English Religion: Educational psychologist - How often do you need to have someone help you when you read instructions, pamphlets, or other written material from your doctor or pharmacy?: Sometimes Writes: Yes Employment Status: Retired Public relations account executive Issues: n/a Guardian/Conservator: Hydrologist   Abuse/Neglect Abuse/Neglect Assessment Can Be Completed: Yes Physical Abuse: Denies Verbal Abuse: Denies Sexual Abuse: Denies Exploitation of patient/patient's resources: Denies Self-Neglect: Denies  Patient response to: Social Isolation - How often do you feel lonely or isolated from those around you?: Never  Emotional Status Recent Psychosocial Issues: coping Psychiatric History: n/a Substance Abuse History: n/a  Patient / Family Perceptions, Expectations & Goals Pt/Family understanding of illness & functional limitations: yes Premorbid pt/family roles/activities: patient previously independent overall Anticipated changes in roles/activities/participation: spouse able to provide assistance with supervision task and daughter able to asssit with physical tasks, if needed Pt/family expectations/goals: Supervision to [place] Care/DME Agencies: Other (Comment) Librarian, academic, Education officer, environmental) Transportation available at discharge: children able to transport Is the patient able to respond to transportation needs?: Yes In the past 12 months, has lack of transportation kept you from medical appointments or from getting medications?: No In the  past 12 months, has lack of transportation kept you from meetings, work, or from getting things needed for daily living?: No  Discharge Planning Living Arrangements: Spouse/significant other Support Systems: Spouse/significant other, Children Type of Residence: Private residence Insurance Resources: Multimedia programmer (specify) Financial Resources: Fish farm manager, Family Support Financial Screen Referred: No Living Expenses: Lives with family Money Management: Patient, Spouse Does the patient have any problems obtaining your medications?: No Home Management: independent Patient/Family Preliminary Plans: spouse and daughter able to assist with money and medication management Care Coordinator Barriers to Discharge: Insurance for SNF coverage, Lack of/limited family support Care Coordinator Barriers to Discharge Comments: spouse cannot provide physical assistance Care Coordinator Anticipated Follow Up Needs: HH/OP Expected length of stay: 10-14 Days  Clinical Impression SW made attempt to call patient family, no answer. Left detailed VM, sw will continue to follow up  Dyanne Iha 03/11/2021, 12:48 PM

## 2021-03-11 NOTE — Progress Notes (Signed)
PROGRESS NOTE   Subjective/Complaints:    ROS- neg CP, SOB, N/V/D  Objective:   EP PPM/ICD IMPLANT  Result Date: 03/04/2021 SURGEON:  Allegra Lai, MD   PREPROCEDURE DIAGNOSIS:  Cryptogenic Stroke   POSTPROCEDURE DIAGNOSIS:  Cryptogenic Stroke    PROCEDURES:  1. Implantable loop recorder implantation   INTRODUCTION:  David [name] is a 81 y.o. male with a history of unexplained stroke who presents today for implantable loop implantation.  The patient has had a cryptogenic stroke.  Despite an extensive workup by neurology, no reversible causes have been identified.  he has worn telemetry during which he did not have arrhythmias.  There is significant concern for possible atrial fibrillation as the cause for the patients stroke.  The patient therefore presents today for implantable loop implantation.   DESCRIPTION OF PROCEDURE:  Informed written consent was obtained, and the patient was brought to the electrophysiology lab in a fasting state.  The patient required no sedation for the procedure today.  Mapping over the patient's chest was performed by the EP lab staff to identify the area where electrograms were most prominent for ILR recording.  This area was found to be the left parasternal region over the 3rd-4th intercostal space. The patients left chest was therefore prepped and draped in the usual sterile fashion by the EP lab staff. The skin overlying the left parasternal region was infiltrated with lidocaine for local analgesia.  A 0.5-cm incision was made over the left parasternal region over the 3rd intercostal space.  A subcutaneous ILR pocket was fashioned using a combination of sharp and blunt dissection.  A Medtronic Reveal Linq model [place] [id number] implantable loop recorder was then placed into the pocket  R waves were very prominent and measured 0.19mV. EBL<1 ml.  Steri- Strips and a sterile dressing were then applied.  There  were no early apparent complications.   CONCLUSIONS:  1. Successful implantation of a Medtronic Reveal LINQ implantable loop recorder for cryptogenic stroke  2. No early apparent complications.   Recent Labs    03/07/2021 0140  WBC 16.3*  HGB 10.6*  HCT 32.4*  PLT 264    Recent Labs    03/11/2021 0140 03/10/21 1016  NA 139 141  K 3.2* 3.6  CL 111 110  CO2 20* 20*  GLUCOSE 119* 111*  BUN 23 21  CREATININE 0.99 0.96  CALCIUM 9.1 9.7     Intake/Output Summary (Last 24 hours) at 03/11/2021 0933 Last data filed at 03/11/2021 0603 Gross per 24 hour  Intake 718.9 ml  Output 350 ml  Net 368.9 ml         Physical Exam: Vital Signs Blood pressure (!) 144/70, pulse 68, temperature 98.1 F (36.7 C), temperature source Oral, resp. rate 14, height 6' 0.01" (1.829 m), weight 92.1 kg, SpO2 96 %.   General: No acute distress Mood and affect are appropriate Heart: Regular rate and rhythm no rubs murmurs or extra sounds Lungs: Clear to auscultation, breathing unlabored, no rales or wheezes Abdomen: Positive bowel sounds, soft nontender to palpation, nondistended Extremities: No clubbing, cyanosis, or edema Skin: No evidence of breakdown, no evidence of rash, loop site CDI,  no hematoma palpated  Neurologic: Cranial nerves II through XII intact, motor strength is 4/5 in bilateral deltoid, bicep, tricep, grip, hip flexor, knee extensors, ankle dorsiflexor and plantar flexor  Musculoskeletal: Full range of motion in all 4 extremities. No joint swelling   Assessment/Plan: 1. Functional deficits which require 3+ hours per day of interdisciplinary therapy in a comprehensive inpatient rehab setting. Physiatrist is providing close team supervision and 24 hour management of active medical problems listed below. Physiatrist and rehab team continue to assess barriers to discharge/monitor patient progress toward functional and medical goals  Care Tool:  Bathing  Bathing activity did not  occur: Safety/medical concerns Body parts bathed by patient: Right arm, Left arm, Chest, Abdomen, Front perineal area, Buttocks, Right upper leg, Left upper leg, Right lower leg, Left lower leg, Face   Body parts bathed by helper: Right arm, Left arm, Chest, Buttocks, Right upper leg, Left upper leg, Right lower leg, Left lower leg, Face     Bathing assist Assist Level: Moderate Assistance - Patient 50 - 74%     Upper Body Dressing/Undressing Upper body dressing Upper body dressing/undressing activity did not occur (including orthotics): Safety/medical concerns What is the patient wearing?: Pull over shirt    Upper body assist Assist Level: Moderate Assistance - Patient 50 - 74%    Lower Body Dressing/Undressing Lower body dressing    Lower body dressing activity did not occur: Safety/medical concerns What is the patient wearing?: Pants, Incontinence brief     Lower body assist Assist for lower body dressing: Moderate Assistance - Patient 50 - 74%     Toileting Toileting    Toileting assist Assist for toileting: Moderate Assistance - Patient 50 - 74%     Transfers Chair/bed transfer  Transfers assist  Chair/bed transfer activity did not occur: Safety/medical concerns  Chair/bed transfer assist level: Moderate Assistance - Patient 50 - 74%     Locomotion Ambulation   Ambulation assist   Ambulation activity did not occur: Safety/medical concerns  Assist level: 2 helpers Assistive device: Hand held assist Max distance: 83ft   Walk 10 feet activity   Assist     Assist level: 2 helpers Assistive device: Hand held assist   Walk 50 feet activity   Assist Walk 50 feet with 2 turns activity did not occur: Safety/medical concerns         Walk 150 feet activity   Assist Walk 150 feet activity did not occur: Safety/medical concerns         Walk 10 feet on uneven surface  activity   Assist Walk 10 feet on uneven surfaces activity did not occur:  Safety/medical concerns         Wheelchair     Assist Is the patient using a wheelchair?: Yes Type of Wheelchair: Manual    Wheelchair assist level: Dependent - Patient 0% Max wheelchair distance: 263ft    Wheelchair 50 feet with 2 turns activity    Assist        Assist Level: Dependent - Patient 0%   Wheelchair 150 feet activity     Assist      Assist Level: Dependent - Patient 0%   Blood pressure (!) 144/70, pulse 68, temperature 98.1 F (36.7 C), temperature source Oral, resp. rate 14, height 6' 0.01" (1.829 m), weight 92.1 kg, SpO2 96 %.  Medical Problem List and Plan: 1. Functional deficits secondary to multifocal acute/early subacute cortical ischemia in the anterior right MCA territory, likely large vessel source from  critical right ICA stenosis.  Status post right ICA stenting.  Left caudate small infarct>> procedure related versus occult atrial fibrillation.             -patient may shower             -ELOS/Goals: 10-15 days, supervision to min assist with PT, OT, and SLP 2.  Antithrombotics: -DVT/anticoagulation:  Pharmaceutical: Lovenox             -antiplatelet therapy: Aspirin, Brilinta 3. Pain Management: Tylenol 4. Mood: LCSW to evaluate and provide emotional support             -antipsychotic agents: N/A 5. Neuropsych: This patient is capable of making decisions on his own behalf. 6. Skin/Wound Care: Routine skin care checks 7. Fluids/Electrolytes/Nutrition: Routine ins and outs and follow up chemistries 8.  Persistent cough with imaging evidence of patchy bibasilar and right suprahilar opacities             -- On Unasyn for suspected multifocal pneumonia; possible aspiration- 7 d course planned              --Continue pulmonary toilet, mucolytics, schedule albuterol nebs              -persistent leukocytosis--but trending down             -currently afebrile 9.  Frequent PVCs: Sees Dr. Stanford Breed as an outpatient  --recommends loop  recorder>> placed 1/10 10: Hypertension: monitor off Benicar  Vitals:   03/10/21 1931 03/11/21 0500  BP: (!) 156/63 (!) 144/70  Pulse: 60 68  Resp: 14 14  Temp: 98.3 F (36.8 C) 98.1 F (36.7 C)  SpO2: 97% 96%  Controlled 1/00, some systolic elevation yesterday pm   11: Hyperlipidemia: Zetia and Lipitor 12: EtOH use: advised to drink no more than 1 alcoholic beverage dialy 13: Overweight: BMI = 28.5. Rec: weight loss, diet and exercise as appropriate 14: CKD stage 3a: serum Cr = 0.99. Monitor Poor intake , dislike dietary restrictions , will do 244ml fluid bolus today , recheck BMET  16: Hypokalemia: improved after oral repletion repeat next week  17: Anemia: asymptomatic --follow-up CBC and monitor 18: Thyroid nodule: follow-up ultrasound in one year recommended 19. Dysphagia: continue D3 with nectars, aspiration precautions             -advance per SLP  20.  Lethargy , improved, slept better last noc  LOS: 2 days A FACE TO FACE EVALUATION WAS PERFORMED  Charlett Blake 03/11/2021, 9:33 AM

## 2021-03-11 NOTE — Progress Notes (Signed)
Occupational Therapy Session Note  Patient Details  Name: David [name] MRN: [id number] Date of Birth: Aug 06, 1940  Today's Date: 03/11/2021 OT Individual Time: 1102-1204 OT Individual Time Calculation (min): 62 min    Short Term Goals: Week 1:  OT Short Term Goal 1 (Week 1): Pt will complete UB dressing at supervision level sitting unsupported. OT Short Term Goal 2 (Week 1): Pt will complete LB dressing with min assist sit to stand for all aspects. OT Short Term Goal 3 (Week 1): Pt will complete toilet transfers with min assist using the RW and 3:1 OT Short Term Goal 4 (Week 1): Pt will complete two grooming tasks in standing at the sink with min guard assist for at least 3 mins.  Skilled Therapeutic Interventions/Progress Updates:    Pt in wheelchair with spouse and his daughter present at start of session.  He requests shower, so worked on ADL tasks during session.  Oxygen sats at 93% on 2Ls nasal cannula.  He was able to complete doffing his shoes with setup with therapist assisting with doffing his TEDs.  He ambulated to the shower with use of the RW and min guard assist.  O2 sats checked again at 92% on room air after sitting.  He was able to complete bathing with min assist sit to stand with use of the grab bars for support.  He was able to complete drying off and then transferred out to the wheelchair with min guard again with use of the RW.  After standing for washing and drying, Oxygen was checked again on room air at 86%.  Oxygen was re-applied at 2Ls nasal cannula to increase back above 92% with purse lip breathing for approximately 1 minute.  He was able to donn UB clothing with min assist and then therapist had to provide total assist for LB dressing secondary to decreased time.  He was left with the call button and phone in reach and safety belt alarm in place.    Therapy Documentation Precautions:  Precautions Precautions: Fall, Other (comment) Precaution Comments: L  hemiparesis Restrictions Weight Bearing Restrictions: No  Pain: Pain Assessment Pain Scale: Faces Pain Score: 0-No pain ADL: See Care Tool Section for some details of mobility and selfcare   Therapy/Group: Individual Therapy  Kare Dado OTR/L 03/11/2021, 12:41 PM

## 2021-03-11 NOTE — Progress Notes (Signed)
Speech Language Pathology Daily Session Note  Patient Details  Name: David [name] MRN: [id number] Date of Birth: [birth year]  Today's Date: 03/11/2021 SLP Individual Time: 7654-6503 SLP Individual Time Calculation (min): 45 min  Short Term Goals: Week 1: SLP Short Term Goal 1 (Week 1): Patient will consume current diet with effective mastication, oral clearance, and without overt s/s of aspiration with min A cues to implement safe swallowing compensatory strategies SLP Short Term Goal 2 (Week 1): Patient will implement speech intelligiblity strategies at sentence level with min A cues to achieve >90% intelligiblity SLP Short Term Goal 3 (Week 1): Patient will use external memory strategies to recall functiona, novel information with min A verbal cues SLP Short Term Goal 4 (Week 1): Patient will complete mildly complex problem solving with min A cues SLP Short Term Goal 5 (Week 1): Patient will demonstrate awareness to errors during functional and familiar tasks with min A verbal cues  Skilled Therapeutic Interventions: Skilled ST treatment focused on cognitive and swallowing goals. Pt more alert this date. Patient consumed only bites of morning meal consisting of dysphagia 3 textures and nectar thick liquids. Mastication efficiency was mildly prolonged yet effective and pt demonstrated slight oral residuals which were effectively cleared with liquid rinse. Pt exhibited no overt s/s of aspiration with all consumed consistencies and with sup A cues for implementation of safe swallowing compensatory strategies for bolus volume, rate of consumption, and alternating between solids and liquids.   Pt agreeable to further cognitive-linguistic assessment via Cognistat. Patient scored within the average range for attention and basic judgement, mild impairment in calculations, and executive functions/reasoning, moderate impairment in memory, and severe impairment in constructional ability. Patient benefited  from extended processing time. Discussed results with patient who acknowledged some sections were more challenging than others. Reviewed plan of care to focus on speech, swallowing, and cognitive skills with emphasis on problem solving, awareness, and memory. Pt verbalized understanding and agreement with plan. Patient was left in bed with alarm activated and immediate needs within reach at end of session. Continue per current plan of care.      Pain Pain Assessment Pain Scale: 0-10 Pain Score: 0-No pain Faces Pain Scale: Hurts even more Pain Type: Acute pain Pain Location: Head Pain Intervention(s): Medication (See eMAR)  Therapy/Group: Individual Therapy  Patty Sermons 03/11/2021, 8:45 AM

## 2021-03-12 DIAGNOSIS — I63511 Cerebral infarction due to unspecified occlusion or stenosis of right middle cerebral artery: Secondary | ICD-10-CM | POA: Diagnosis not present

## 2021-03-12 LAB — CBC
HCT: 33.1 % — ABNORMAL LOW (ref 39.0–52.0)
Hemoglobin: 10.4 g/dL — ABNORMAL LOW (ref 13.0–17.0)
MCH: 28.4 pg (ref 26.0–34.0)
MCHC: 31.4 g/dL (ref 30.0–36.0)
MCV: 90.4 fL (ref 80.0–100.0)
Platelets: 384 10*3/uL (ref 150–400)
RBC: 3.66 MIL/uL — ABNORMAL LOW (ref 4.22–5.81)
RDW: 15 % (ref 11.5–15.5)
WBC: 8.4 10*3/uL (ref 4.0–10.5)
nRBC: 0 % (ref 0.0–0.2)

## 2021-03-12 MED ORDER — ALBUTEROL SULFATE (2.5 MG/3ML) 0.083% IN NEBU
3.0000 mL | INHALATION_SOLUTION | Freq: Three times a day (TID) | RESPIRATORY_TRACT | Status: DC
  Administered 2021-03-12 – 2021-03-15 (×9): 3 mL via RESPIRATORY_TRACT
  Filled 2021-03-12 (×9): qty 3

## 2021-03-12 MED ORDER — LIDOCAINE 5 % EX PTCH
1.0000 | MEDICATED_PATCH | CUTANEOUS | Status: DC
  Administered 2021-03-12 – 2021-03-27 (×16): 1 via TRANSDERMAL
  Filled 2021-03-12 (×16): qty 1

## 2021-03-12 NOTE — Progress Notes (Signed)
Purposeful rounding completed every hour to assess needs, toileting, and repositioning. Pt has intermittent incontinence and requires assistance with the urinal. PT is wearing a brief that was changed twice during the shift. Pt is currently clean, dry, and appears without distress. RN will continue to monitor. Pt noted to have oxygen off during rounds and would replace the O2. Pt SPO2 is 85-88 room air and requires 2L O2. HOB elevated 30 degrees. Pt c/o of mild headache at the start of shift that was relieved by PRN 650 mg acetaminophen. RN will continue to monitor.

## 2021-03-12 NOTE — Progress Notes (Signed)
Occupational Therapy Session Note  Patient Details  Name: David [name] MRN: [id number] Date of Birth: [birth year]  Today's Date: 03/12/2021 OT Individual Time: 7262-0355 OT Individual Time Calculation (min): 53 min    Short Term Goals: Week 1:  OT Short Term Goal 1 (Week 1): Pt will complete UB dressing at supervision level sitting unsupported. OT Short Term Goal 2 (Week 1): Pt will complete LB dressing with min assist sit to stand for all aspects. OT Short Term Goal 3 (Week 1): Pt will complete toilet transfers with min assist using the RW and 3:1 OT Short Term Goal 4 (Week 1): Pt will complete two grooming tasks in standing at the sink with min guard assist for at least 3 mins.  Skilled Therapeutic Interventions/Progress Updates:    Pt completed dressing from the EOB with his son-in-law present for session.  Oxygen sats 92% on room air at rest in supine.  After transferring to the left EOB with close supervision, his sats decreased down to 88% or room air.  Two liters of O2 nasal cannula was placed with oxygen coming back up to 92%.  He worked on [place] old brief and pants with min assist sit to stand as well as completing washing front and back peri area.  Dyspnea 2/4 throughout with min instructional cueing for purse lip breathing.  O2 sats at 91% or greater on the 2Ls however.  He was able to complete donning his brief at min assist as well as pants, both sit to stand.  He also needed min assist and increased time for donning long sleeve shirt in order to pull it down over his back.  Discussed benefit of a slightly larger shirt, making this somewhat easier.  He was then able to donn his shoes, but needed mod assist for tying them.  Finished session with stand pivot transfer to the wheelchair with use of the RW and min guard assist.  Safety alarm belt in place and call button and phone in reach.  Pt maintained on 2Ls nasal cannula.    Therapy Documentation Precautions:   Precautions Precautions: Fall, Other (comment) Precaution Comments: L hemiparesis Restrictions Weight Bearing Restrictions: No  Pain: Pain Assessment Pain Scale: 0-10 Pain Score: 3  Pain Type: Chronic pain Pain Location: Hip Pain Orientation: Left Pain Intervention(s): Repositioned ADL: See Care Tool Section for some details of mobility and selfcare   Therapy/Group: Individual Therapy  Josephene Marrone.xx 03/12/2021, 12:23 PM

## 2021-03-12 NOTE — Progress Notes (Signed)
Speech Language Pathology Daily Session Note  Patient Details  Name: David [name] MRN: [id number] Date of Birth: [birth year]  Today's Date: 03/12/2021 SLP Individual Time: 1000-1030 SLP Individual Time Calculation (min): 30 min  Short Term Goals: Week 1: SLP Short Term Goal 1 (Week 1): Patient will consume current diet with effective mastication, oral clearance, and without overt s/s of aspiration with min A cues to implement safe swallowing compensatory strategies SLP Short Term Goal 2 (Week 1): Patient will implement speech intelligiblity strategies at sentence level with min A cues to achieve >90% intelligiblity SLP Short Term Goal 3 (Week 1): Patient will use external memory strategies to recall functiona, novel information with min A verbal cues SLP Short Term Goal 4 (Week 1): Patient will complete mildly complex problem solving with min A cues SLP Short Term Goal 5 (Week 1): Patient will demonstrate awareness to errors during functional and familiar tasks with min A verbal cues  Skilled Therapeutic Interventions: Skilled treatment session focused on cognitive and dysphagia goals. Upon arrival, patient's daughter and son-in-law present and informed SLP they had just thickened water for the patient to drink. Family then left to go home. The thickened water had been made with ice in the cup and had not been removed prior to thickening resulting in a thin liquid. RN made aware and educated on not allowing family to thicken liquids until SLP education is provided. RN verbalized understanding. SLP thickened water appropriately and provided Max verbal cues for intellectual awareness regarding reasoning for liquids and recall of swallowing compensatory strategies.  Patient with a large, explosive coughing episode on initial sip, suspect due to bolus size. With Max verbal cues for use of small sips and a complete chin tuck, coughing was eliminated. Recommend patient continue current diet with full  supervision for use of swallowing compensatory strategies. SLP provided mod verbal cues for orientation to time and Max verbal cues for orientation to situation as patient unable to recall that he has had bilateral strokes. Patient only able verbalize he was here due to PNA and the flu. Patient left upright in the wheelchair with alarm on and all needs within reach. Continue with current plan of care.      Pain Pain Assessment Pain Scale: 0-10 Pain Score: 3  Pain Type: Chronic pain Pain Location: Hip Pain Orientation: Left Pain Intervention(s): Repositioned  Therapy/Group: Individual Therapy  Emelin Dascenzo 03/12/2021, 11:58 AM

## 2021-03-12 NOTE — Progress Notes (Signed)
PROGRESS NOTE   Subjective/Complaints:  Left lateral hip pain a night , no groin pain , hx of Left knee replacement with "bone on bone" arthritis RIght knee per pt   ROS- neg CP, SOB, N/V/D  Objective:   No results found. Recent Labs    03/12/21 0458  WBC 8.4  HGB 10.4*  HCT 33.1*  PLT 384    Recent Labs    03/10/21 1016  NA 141  K 3.6  CL 110  CO2 20*  GLUCOSE 111*  BUN 21  CREATININE 0.96  CALCIUM 9.7     Intake/Output Summary (Last 24 hours) at 03/12/2021 0751 Last data filed at 03/12/2021 0003 Gross per 24 hour  Intake 280 ml  Output 300 ml  Net -20 ml         Physical Exam: Vital Signs Blood pressure 138/85, pulse 80, temperature 98.7 F (37.1 C), resp. rate 20, height 6' 0.01" (1.829 m), weight 92.1 kg, SpO2 94 %.   General: No acute distress Mood and affect are appropriate Heart: Regular rate and rhythm no rubs murmurs or extra sounds Lungs: Clear to auscultation, breathing unlabored, no rales or wheezes Abdomen: Positive bowel sounds, soft nontender to palpation, nondistended Extremities: No clubbing, cyanosis, or edema Skin: No evidence of breakdown, no evidence of rash, loop site CDI, no hematoma palpated  Neurologic: Cranial nerves II through XII intact, motor strength is 4/5 in bilateral deltoid, bicep, tricep, grip, hip flexor, knee extensors, ankle dorsiflexor and plantar flexor  Musculoskeletal: Full range of motion in all 4 extremities. No joint swelling, left hip tenderness over greater troch , no pain with hip ROM    Assessment/Plan: 1. Functional deficits which require 3+ hours per day of interdisciplinary therapy in a comprehensive inpatient rehab setting. Physiatrist is providing close team supervision and 24 hour management of active medical problems listed below. Physiatrist and rehab team continue to assess barriers to discharge/monitor patient progress toward functional  and medical goals  Care Tool:  Bathing  Bathing activity did not occur: Safety/medical concerns Body parts bathed by patient: Right arm, Left arm, Chest, Abdomen, Front perineal area, Buttocks, Right upper leg, Left upper leg, Right lower leg, Left lower leg, Face   Body parts bathed by helper: Right arm, Left arm, Chest, Buttocks, Right upper leg, Left upper leg, Right lower leg, Left lower leg, Face     Bathing assist Assist Level: Minimal Assistance - Patient > 75%     Upper Body Dressing/Undressing Upper body dressing Upper body dressing/undressing activity did not occur (including orthotics): Safety/medical concerns What is the patient wearing?: Pull over shirt    Upper body assist Assist Level: Minimal Assistance - Patient > 75%    Lower Body Dressing/Undressing Lower body dressing    Lower body dressing activity did not occur: Safety/medical concerns What is the patient wearing?: Pants, Incontinence brief     Lower body assist Assist for lower body dressing: Moderate Assistance - Patient 50 - 74%     Toileting Toileting    Toileting assist Assist for toileting: Moderate Assistance - Patient 50 - 74%     Transfers Chair/bed transfer  Transfers assist  Chair/bed transfer activity  did not occur: Safety/medical concerns  Chair/bed transfer assist level: Minimal Assistance - Patient > 75% Chair/bed transfer assistive device: Walker, Clinical biochemist   Ambulation assist   Ambulation activity did not occur: Safety/medical concerns  Assist level: Minimal Assistance - Patient > 75% Assistive device: Walker-rolling Max distance: 88ft   Walk 10 feet activity   Assist     Assist level: 2 helpers Assistive device: Hand held assist   Walk 50 feet activity   Assist Walk 50 feet with 2 turns activity did not occur: Safety/medical concerns         Walk 150 feet activity   Assist Walk 150 feet activity did not occur: Safety/medical  concerns         Walk 10 feet on uneven surface  activity   Assist Walk 10 feet on uneven surfaces activity did not occur: Safety/medical concerns         Wheelchair     Assist Is the patient using a wheelchair?: Yes Type of Wheelchair: Manual    Wheelchair assist level: Dependent - Patient 0% Max wheelchair distance: 245ft    Wheelchair 50 feet with 2 turns activity    Assist        Assist Level: Dependent - Patient 0%   Wheelchair 150 feet activity     Assist      Assist Level: Dependent - Patient 0%   Blood pressure 138/85, pulse 80, temperature 98.7 F (37.1 C), resp. rate 20, height 6' 0.01" (1.829 m), weight 92.1 kg, SpO2 94 %.  Medical Problem List and Plan: 1. Functional deficits secondary to multifocal acute/early subacute cortical ischemia in the anterior right MCA territory, likely large vessel source from critical right ICA stenosis.  Status post right ICA stenting.  Left caudate small infarct>> procedure related versus occult atrial fibrillation.             -patient may shower             -ELOS/Goals: 10-15 days, supervision to min assist with PT, OT, and SLP 2.  Antithrombotics: -DVT/anticoagulation:  Pharmaceutical: Lovenox             -antiplatelet therapy: Aspirin, Brilinta 3. Pain Management: Tylenol Left hip pain , exam c/w L troch bursitis , will add lidoderm patch at noc, therapy can address as well  4. Mood: LCSW to evaluate and provide emotional support             -antipsychotic agents: N/A 5. Neuropsych: This patient is capable of making decisions on his own behalf. 6. Skin/Wound Care: Routine skin care checks 7. Fluids/Electrolytes/Nutrition: Routine ins and outs and follow up chemistries 8.  Persistent cough with imaging evidence of patchy bibasilar and right suprahilar opacities             -- On Unasyn for suspected multifocal pneumonia; possible aspiration- 7 d course planned              --Continue pulmonary toilet,  mucolytics, schedule albuterol nebs              -persistent leukocytosis--but trending down             -currently afebrile 9.  Frequent PVCs: Sees Dr. Stanford Breed as an outpatient  --recommends loop recorder>> placed 1/10 10: Hypertension: monitor off Benicar  Vitals:   03/11/21 2123 03/12/21 0437  BP:  138/85  Pulse:  80  Resp:    Temp:  98.7 F (37.1 C)  SpO2: (!) 88%  94%  Controlled 1/13,    11: Hyperlipidemia: Zetia and Lipitor 12: EtOH use: advised to drink no more than 1 alcoholic beverage dialy 13: Overweight: BMI = 28.5. Rec: weight loss, diet and exercise as appropriate 14: CKD stage 3a: serum Cr = 0.99. Monitor Poor intake , dislike dietary restrictions , will do 258ml fluid bolus today , recheck BMET  16: Hypokalemia: improved after oral repletion repeat next week  17: Anemia: asymptomatic --follow-up CBC and monitor 18: Thyroid nodule: follow-up ultrasound in one year recommended 19. Dysphagia: continue D3 with nectars, aspiration precautions             -advance per SLP  20.  Lethargy , improved, slept better last noc  LOS: 3 days A FACE TO FACE EVALUATION WAS PERFORMED  Charlett Blake 03/12/2021, 7:51 AM

## 2021-03-12 NOTE — Progress Notes (Signed)
Physical Therapy Session Note  Patient Details  Name: David [name] MRN: [id number] Date of Birth: August 06, 1940  Today's Date: 03/12/2021 PT Individual Time: 3005-1102  PT Individual Time Calculation (min): 69 min      Short Term Goals: Week 1:  PT Short Term Goal 1 (Week 1): Pt will perform supine<>sit with min assist PT Short Term Goal 2 (Week 1): Pt will perform sit<>stands using LRAD with CGA PT Short Term Goal 3 (Week 1): Pt will perform stand pivot transfers using LRAD with CGA PT Short Term Goal 4 (Week 1): Pt will ambulate at least 42ft using LRAD with min assist of 1 PT Short Term Goal 5 (Week 1): Pt will navigate 4 stairs using HRs with min assist of 1 Week 2:    Week 3:     Skilled Therapeutic Interventions/Progress Updates:    AM SESSION: Pt initially oob in wc w/wife at side.  States his neck is "a little sore from sleeping wrong" but otherwise denies.  Transported to gym, therapist provided 02 tank, 2L 02 via [place]  Repeated Sit to stand x 3 using Ues for assist w/power up, min assist for stabilizing mild post tendency w/full upright, no AD HR 94, 02 96%  Gait 57ft w/light HHA, shuffling type gait, clearance BLE decrease w/fatigue R moreso than L, cues for step height and length w/gait.    Standing alternating stepping to cone to promote increased step length - repeats x 4 w/HHA before fatigue - HR 119, 02 98%.  Gait 2ft w/HHA of 1, deviations as above.   HR 112, 02 Sats 95%  Pt easily becomes SOB w/activity but 02 sats >94%, HR recovers w/seated rest.  Nonproductive cough following exertion.   Pt transported to room.  Sit to stand and short distance gait w/min/HHA of 1, shuffling/stooped posture.  Pt able to doff shoes in sitting w/supervision.  Sit to supine w/cues. Pt left supine w/rails up x 3, alarm set, bed in lowest position, and needs in reach.  PM SESSION - pt sleeping heavily, unable to arouse for session.    Therapy Documentation Precautions:   Precautions Precautions: Fall, Other (comment) Precaution Comments: L hemiparesis Restrictions Weight Bearing Restrictions: No Therapy/Group: Individual Therapy Callie Fielding, [place] 03/12/2021, 12:17 PM

## 2021-03-12 NOTE — IPOC Note (Signed)
Overall Plan of Care [facility]) Patient Details Name: ABHISHEK LEVESQUE MRN: [id number] DOB: 10/17/1940  Admitting Diagnosis: Acute right MCA stroke [facility])  Hospital Problems: Principal Problem:   Acute right MCA stroke [facility])     Functional Problem List: Nursing Bladder, Medication Management, Bowel, Endurance, Pain, Safety  PT Balance, Perception, Behavior, Safety, Edema, Sensory, Endurance, Skin Integrity, Motor, Nutrition, Pain  OT Balance, Endurance, Motor, Cognition, Sensory, Safety  SLP Cognition  TR         Basic ADLs: OT Grooming, Bathing, Dressing, Toileting, Eating     Advanced  ADLs: OT Simple Meal Preparation     Transfers: PT Bed Mobility, Bed to Chair, Car, Manufacturing systems engineer, Metallurgist: PT Ambulation, Emergency planning/management officer, Stairs     Additional Impairments: OT Fuctional Use of Upper Extremity  SLP Swallowing, Communication expression    TR      Anticipated Outcomes Item Anticipated Outcome  Self Feeding independent  Swallowing  sup A   Basic self-care  supervision  Toileting  supervision   Bathroom Transfers supervision  Bowel/Bladder  manage bowel and bladder with mod I assist  Transfers  supervision using LRAD  Locomotion  supervision using LRAD  Communication  mod I  Cognition  sup A  Pain  pain at or below level 4 with prn meds  Safety/Judgment  maintain safety wtih cues/reminders   Therapy Plan: PT Intensity: Minimum of 1-2 x/day ,45 to 90 minutes PT Frequency: 5 out of 7 days PT Duration Estimated Length of Stay: ~ 2 weeks OT Intensity: Minimum of 1-2 x/day, 45 to 90 minutes OT Frequency: 5 out of 7 days OT Duration/Estimated Length of Stay: 14-16 days SLP Intensity: Minumum of 1-2 x/day, 30 to 90 minutes SLP Frequency: 3 to 5 out of 7 days SLP Duration/Estimated Length of Stay: ~2 weeks   Due to the current state of emergency, patients may not be receiving their 3-hours of Medicare-mandated therapy.    Team Interventions: Nursing Interventions Disease Management/Prevention, Bladder Management, Discharge Planning, Pain Management, Bowel Management, Patient/Family Education, Medication Management  PT interventions Ambulation/gait training, Community reintegration, DME/adaptive equipment instruction, Neuromuscular re-education, Psychosocial support, Stair training, UE/LE Strength taining/ROM, Wheelchair propulsion/positioning, Training and development officer, Discharge planning, Functional electrical stimulation, Pain management, Skin care/wound management, Therapeutic Activities, UE/LE Coordination activities, Cognitive remediation/compensation, Disease management/prevention, Functional mobility training, Patient/family education, Splinting/orthotics, Therapeutic Exercise, Visual/perceptual remediation/compensation  OT Interventions Balance/vestibular training, DME/adaptive equipment instruction, Discharge planning, Self Care/advanced ADL retraining, Therapeutic Activities, UE/LE Coordination activities, Patient/family education, Functional mobility training, Therapeutic Exercise, UE/LE Strength taining/ROM, Neuromuscular re-education, Cognitive remediation/compensation, Pain management, Disease mangement/prevention, Community reintegration  SLP Interventions Cognitive remediation/compensation, Internal/external aids, Speech/Language facilitation, Cueing hierarchy, Dysphagia/aspiration precaution training, Functional tasks, Patient/family education, Therapeutic Activities  TR Interventions    SW/CM Interventions Discharge Planning, Psychosocial Support, Patient/Family Education, Disease Management/Prevention   Barriers to Discharge MD  Medical stability  Nursing Decreased caregiver support, Home environment access/layout home with spouse  PT New oxygen    OT      SLP      SW Insurance for SNF coverage, Lack of/limited family support spouse cannot provide physical assistance   Team Discharge  Planning: Destination: PT-Home ,OT- Home , SLP-Home Projected Follow-up: PT-Outpatient PT, 24 hour supervision/assistance, OT-  24 hour supervision/assistance, Home health OT, SLP-24 hour supervision/assistance, Outpatient SLP, Home Health SLP Projected Equipment Needs: PT-To be determined, OT- To be determined, SLP-To be determined Equipment Details: PT- , OT-  Patient/family involved in discharge planning: PT- Patient,  OT-Patient, SLP-Patient  MD ELOS: 13-16 days Medical Rehab Prognosis:  Excellent Assessment: The patient has been admitted for CIR therapies with the diagnosis of right MCA infarct. The team will be addressing functional mobility, strength, stamina, balance, safety, adaptive techniques and equipment, self-care, bowel and bladder mgt, patient and caregiver education, NMR, visual-spatial awareness, speech, cognition and community reentry. Goals have been set at supervision for mobility and self-care and supervision to mod I with cognition and communication.   Due to the current state of emergency, patients may not be receiving their 3 hours per day of Medicare-mandated therapy.    [name], MD, FAAPMR     See Team Conference Notes for weekly updates to the plan of care

## 2021-03-13 NOTE — Progress Notes (Signed)
Occupational Therapy Session Note  Patient Details  Name: David [name] MRN: [id number] Date of Birth: [birth year]  Today's Date: 03/13/2021 OT Individual Time: 1130-1200 OT Individual Time Calculation (min): 30 min    Short Term Goals: Week 1:  OT Short Term Goal 1 (Week 1): Pt will complete UB dressing at supervision level sitting unsupported. OT Short Term Goal 2 (Week 1): Pt will complete LB dressing with min assist sit to stand for all aspects. OT Short Term Goal 3 (Week 1): Pt will complete toilet transfers with min assist using the RW and 3:1 OT Short Term Goal 4 (Week 1): Pt will complete two grooming tasks in standing at the sink with min guard assist for at least 3 mins.  Skilled Therapeutic Interventions/Progress Updates:  Patient met seated in recliner in agreement with OT treatment session. 0/10 pain reported at rest and with activity. Focus of session on LUE NMR with written HEP 2/2 mild L hemi. MMT 4/5 in LUE at shoulder/elbow. 3+/5 to 3-/5 at wrist and digits. Patient completed 1 set x 10 reps each of hand, wrist and elbow HEP and [place] HEP with cues for technique. Session concluded with patient seated in recliner with call bell within reach, belt alarm activated and all needs met.   Therapy Documentation Precautions:  Precautions Precautions: Fall, Other (comment) Precaution Comments: L hemiparesis Restrictions Weight Bearing Restrictions: No General:   Therapy/Group: Individual Therapy  Kailly Richoux R [place] 03/13/2021, 11:02 AM

## 2021-03-13 NOTE — Progress Notes (Signed)
SLP Cancellation Note  Patient Details Name: David [name] MRN: [id number] DOB: 1941-02-11   Cancelled treatment:        SLP arrived to see patient for scheduled session but patient sleeping soundly and unable to adequately arouse. Patient missed 45 minutes ST session.                                                                                              Sonia Baller, MA, CCC-SLP Speech Therapy

## 2021-03-13 NOTE — Progress Notes (Signed)
Occupational Therapy Session Note  Patient Details  Name: David [name] MRN: [id number] Date of Birth: October 06, 1940  Today's Date: 03/13/2021 OT Individual Time: 9802-2179 OT Individual Time Calculation (min): 57 min    Short Term Goals: Week 1:  OT Short Term Goal 1 (Week 1): Pt will complete UB dressing at supervision level sitting unsupported. OT Short Term Goal 2 (Week 1): Pt will complete LB dressing with min assist sit to stand for all aspects. OT Short Term Goal 3 (Week 1): Pt will complete toilet transfers with min assist using the RW and 3:1 OT Short Term Goal 4 (Week 1): Pt will complete two grooming tasks in standing at the sink with min guard assist for at least 3 mins.  Skilled Therapeutic Interventions/Progress Updates:    Pt received seated in w/c, no c/o pain but ongoing cough noted throughout session, agreeable to therapy. Session focus on self-care retraining, activity tolerance, dynamic standing balance in prep for improved ADL/IADL/func mobility performance + decreased caregiver burden. Declines need for ADL. RN present to disconnect from IV. SatO2 at 98% on 2L via [place] at start of session, decreased down to 91% on 2L post activity but recovered to 94% when guided through PLB.   Total A w/c transport to 5N hallway. To target dynamic standing balance and general activity tolerance, pt ambulated 120 ft x2 bouts with close S + RW + close w/c follow. W/c transport back to room due to fatigue. Completed short ambulatory transfer > recliner same manner as above. Doffed shrit with min A to pull over head, donned new shirt with mod A to thread LUE and to pull down in back.   Pt left seated in recliner with family present with safety belt alarm engaged, call bell in reach, and all immediate needs met.    Therapy Documentation Precautions:  Precautions Precautions: Fall, Other (comment) Precaution Comments: L hemiparesis Restrictions Weight Bearing Restrictions: No  Pain: no  c/o   ADL: See Care Tool for more details.  Therapy/Group: Individual Therapy  [name] MS, OTR/L  03/13/2021, 6:55 AM

## 2021-03-13 NOTE — Progress Notes (Signed)
Physical Therapy Session Note  Patient Details  Name: David [name] MRN: [id number] Date of Birth: 1940-07-28  Today's Date: 03/13/2021 PT Individual Time: 0805-0904 PT Individual Time Calculation (min): 59 min   Short Term Goals: Week 1:  PT Short Term Goal 1 (Week 1): Pt will perform supine<>sit with min assist PT Short Term Goal 2 (Week 1): Pt will perform sit<>stands using LRAD with CGA PT Short Term Goal 3 (Week 1): Pt will perform stand pivot transfers using LRAD with CGA PT Short Term Goal 4 (Week 1): Pt will ambulate at least 69ft using LRAD with min assist of 1 PT Short Term Goal 5 (Week 1): Pt will navigate 4 stairs using HRs with min assist of 1  Skilled Therapeutic Interventions/Progress Updates:    Pt received supine in bed and agreeable to therapy session. Pt noted to have nasal cannula fallen off his face and pt unaware - therapist reapplied and pt remained on 2L of O2 via nasal cannula - pt's SpO2 92% at rest and maintaining between 90%-95% throughout session with mobility. Supine>sitting R EOB, HOB elevated but not using bedrail, with close supervision for safety. Sitting EOB donned TED hose and tennis shoes total assist for time management. Pt reports need to use bathroom. Sit>stand EOB>RW with min assist for lifting to stand. Standing using intermittent UE support on RW pt managing LB clothing with min assist and using urinal for continent void of bladder with set-up assist. Performed hand hygiene. Therapist provided pt with suction toothbrush and he completed oral care with set-up assist and education on how to use brush. Therapist educated pt on importance of increased intake and provided pt with 1cup of nectar thick water. Therapist educated pt on importance of using incentive spirometer - pt does not recall correct use of this device so therapist retrained and pt performed x10 reps.  Gait around bed to sink using RW with light min assist for balance - continues to move  very slow with decreased B LE step lengths and kyphotic posturing. Standing at sink with CGA performed hand washing at sink with CGA.   Gait training ~43ft-80ft 2x using RW with light min assist for balance on 2L of O2 via nasal cannula with pt having labored breathing immediately after but SpO2 91% - continues to have very slow gait speed with decreased B LE step lengths and kyphotic trunk posture with downward gaze - cuing throughout for improvement with minor changes made.   At end of session pt left seated in w/c with needs in reach, seat belt alarm on, and pt returned to wall 2L O2.     Therapy Documentation Precautions:  Precautions Precautions: Fall, Other (comment) Precaution Comments: L hemiparesis Restrictions Weight Bearing Restrictions: No  Pain:  Denies pain during session.    Therapy/Group: Individual Therapy  Tawana Scale , PT, DPT, NCS, CSRS  03/13/2021, 7:43 AM

## 2021-03-14 DIAGNOSIS — I63511 Cerebral infarction due to unspecified occlusion or stenosis of right middle cerebral artery: Secondary | ICD-10-CM | POA: Diagnosis not present

## 2021-03-14 MED ORDER — IRBESARTAN 75 MG PO TABS
37.5000 mg | ORAL_TABLET | Freq: Every day | ORAL | Status: DC
  Administered 2021-03-14 – 2021-03-16 (×3): 37.5 mg via ORAL
  Filled 2021-03-14 (×3): qty 1

## 2021-03-14 NOTE — Progress Notes (Addendum)
PROGRESS NOTE   Subjective/Complaints:  Pt wished to have a recap of his acute and rehab hospitalization , discussed prognosis for further functional improvement   ROS- neg CP, SOB, N/V/D  Objective:   No results found. Recent Labs    03/12/21 0458  WBC 8.4  HGB 10.4*  HCT 33.1*  PLT 384    No results for input(s): NA, K, CL, CO2, GLUCOSE, BUN, CREATININE, CALCIUM in the last 72 hours.   Intake/Output Summary (Last 24 hours) at 03/14/2021 1004 Last data filed at 03/14/2021 8280 Gross per 24 hour  Intake 818.17 ml  Output 550 ml  Net 268.17 ml         Physical Exam: Vital Signs Blood pressure (!) 165/66, pulse 88, temperature 98.1 F (36.7 C), temperature source Oral, resp. rate 18, height 6' 0.01" (1.829 m), weight 92.1 kg, SpO2 96 %.   General: No acute distress Mood and affect are appropriate Heart: Regular rate and rhythm no rubs murmurs or extra sounds Lungs: Clear to auscultation, breathing unlabored, no rales or wheezes Abdomen: Positive bowel sounds, soft nontender to palpation, nondistended Extremities: No clubbing, cyanosis, or edema Skin: No evidence of breakdown, no evidence of rash, loop site CDI, no hematoma palpated  Neurologic: Cranial nerves II through XII intact, motor strength is 4/5 in bilateral deltoid, bicep, tricep, grip, hip flexor, knee extensors, ankle dorsiflexor and plantar flexor  Musculoskeletal: Full range of motion in all 4 extremities. No joint swelling, left hip tenderness over greater troch , no pain with hip ROM    Assessment/Plan: 1. Functional deficits which require 3+ hours per day of interdisciplinary therapy in a comprehensive inpatient rehab setting. Physiatrist is providing close team supervision and 24 hour management of active medical problems listed below. Physiatrist and rehab team continue to assess barriers to discharge/monitor patient progress toward  functional and medical goals  Care Tool:  Bathing  Bathing activity did not occur: Safety/medical concerns Body parts bathed by patient: Right arm, Left arm, Chest, Abdomen, Front perineal area, Buttocks, Right upper leg, Left upper leg, Right lower leg, Left lower leg, Face   Body parts bathed by helper: Right arm, Left arm, Chest, Buttocks, Right upper leg, Left upper leg, Right lower leg, Left lower leg, Face     Bathing assist Assist Level: Minimal Assistance - Patient > 75%     Upper Body Dressing/Undressing Upper body dressing Upper body dressing/undressing activity did not occur (including orthotics): Safety/medical concerns What is the patient wearing?: Pull over shirt    Upper body assist Assist Level: Moderate Assistance - Patient 50 - 74%    Lower Body Dressing/Undressing Lower body dressing    Lower body dressing activity did not occur: Safety/medical concerns What is the patient wearing?: Pants, Incontinence brief     Lower body assist Assist for lower body dressing: Moderate Assistance - Patient 50 - 74%     Toileting Toileting    Toileting assist Assist for toileting: Moderate Assistance - Patient 50 - 74%     Transfers Chair/bed transfer  Transfers assist  Chair/bed transfer activity did not occur: Safety/medical concerns  Chair/bed transfer assist level: Supervision/Verbal cueing Chair/bed transfer assistive device: Gilford Rile  Locomotion Ambulation   Ambulation assist   Ambulation activity did not occur: Safety/medical concerns  Assist level: Minimal Assistance - Patient > 75% Assistive device: Walker-rolling Max distance: 46ft   Walk 10 feet activity   Assist     Assist level: 2 helpers Assistive device: Hand held assist   Walk 50 feet activity   Assist Walk 50 feet with 2 turns activity did not occur: Safety/medical concerns         Walk 150 feet activity   Assist Walk 150 feet activity did not occur: Safety/medical  concerns         Walk 10 feet on uneven surface  activity   Assist Walk 10 feet on uneven surfaces activity did not occur: Safety/medical concerns         Wheelchair     Assist Is the patient using a wheelchair?: Yes Type of Wheelchair: Manual    Wheelchair assist level: Dependent - Patient 0% Max wheelchair distance: 258ft    Wheelchair 50 feet with 2 turns activity    Assist        Assist Level: Dependent - Patient 0%   Wheelchair 150 feet activity     Assist      Assist Level: Dependent - Patient 0%   Blood pressure (!) 165/66, pulse 88, temperature 98.1 F (36.7 C), temperature source Oral, resp. rate 18, height 6' 0.01" (1.829 m), weight 92.1 kg, SpO2 96 %.  Medical Problem List and Plan: 1. Functional deficits secondary to multifocal acute/early subacute cortical ischemia in the anterior right MCA territory, likely large vessel source from critical right ICA stenosis.  Status post right ICA stenting.  Left caudate small infarct>> procedure related versus occult atrial fibrillation.             -patient may shower             -ELOS/Goals: 10-15 days, supervision to min assist with PT, OT, and SLP Enc use of LUE for fxnl activities 2.  Antithrombotics: -DVT/anticoagulation:  Pharmaceutical: Lovenox             -antiplatelet therapy: Aspirin, Brilinta 3. Pain Management: Tylenol Left hip pain , exam c/w L troch bursitis , will add lidoderm patch at noc, therapy can address as well  4. Mood: LCSW to evaluate and provide emotional support             -antipsychotic agents: N/A 5. Neuropsych: This patient is capable of making decisions on his own behalf. 6. Skin/Wound Care: Routine skin care checks 7. Fluids/Electrolytes/Nutrition: Routine ins and outs and follow up chemistries 8.  Persistent cough with imaging evidence of patchy bibasilar and right suprahilar opacities             -- On Unasyn for suspected multifocal pneumonia; possible  aspiration- 7 d course planned              --Continue pulmonary toilet, mucolytics, schedule albuterol nebs              -persistent leukocytosis--but trending down             -currently afebrile 9.  Frequent PVCs: Sees Dr. Stanford Breed as an outpatient  --recommends loop recorder>> placed 1/10 10: Hypertension: monitor off Benicar  Vitals:   03/14/21 0501 03/14/21 0809  BP: (!) 165/66   Pulse: 88   Resp: 18   Temp: 98.1 F (36.7 C)   SpO2: 97% 96%  ELevated off antihypertensive meds , will start low dose avapro, hospital  pharmacy substitution for Benicar   11: Hyperlipidemia: Zetia and Lipitor 12: EtOH use: advised to drink no more than 1 alcoholic beverage dialy 13: Overweight: BMI = 28.5. Rec: weight loss, diet and exercise as appropriate 14: CKD stage 3a: serum Cr = 0.99. Monitor Poor intake , dislike dietary restrictions , will do 235ml fluid bolus today , recheck BMET  16: Hypokalemia: improved after oral repletion repeat next week  17: Anemia: asymptomatic --follow-up CBC and monitor 18: Thyroid nodule: follow-up ultrasound in one year recommended 19. Dysphagia: continue D3 with nectars, aspiration precautions             -advance per SLP  20.  Lethargy , improved, slept better last noc  LOS: 5 days A FACE TO FACE EVALUATION WAS PERFORMED  Charlett Blake 03/14/2021, 10:04 AM

## 2021-03-15 DIAGNOSIS — I63511 Cerebral infarction due to unspecified occlusion or stenosis of right middle cerebral artery: Secondary | ICD-10-CM | POA: Diagnosis not present

## 2021-03-15 MED ORDER — ALBUTEROL SULFATE (2.5 MG/3ML) 0.083% IN NEBU
3.0000 mL | INHALATION_SOLUTION | Freq: Two times a day (BID) | RESPIRATORY_TRACT | Status: DC
  Administered 2021-03-15 – 2021-03-16 (×3): 3 mL via RESPIRATORY_TRACT
  Filled 2021-03-15 (×3): qty 3

## 2021-03-15 NOTE — Progress Notes (Addendum)
Speech Language Pathology Note  Patient Details  Name: David [name] MRN: [id number] Date of Birth: 1940-05-29  Attempted to visit with patient at 1508 due to missed time earlier today 2/2 nsg needs and respiratory therapy services. Patient unavailable at to this time due to nursing needs being performed during time of arrival.   Patty Sermons 03/15/2021, 3:12 PM

## 2021-03-15 NOTE — Progress Notes (Signed)
PROGRESS NOTE   Subjective/Complaints:  Pt reports OK weekend- has a little bac pain this AM feels due to bed.  Difficulty eating the food due to  nectar thick liquids, type of food.   LBM yesterday.  Wounds on L chest a little painful.     ROS-  Pt denies SOB, abd pain, CP, N/V/C/D, and vision changes   Objective:   No results found. No results for input(s): WBC, HGB, HCT, PLT in the last 72 hours. No results for input(s): NA, K, CL, CO2, GLUCOSE, BUN, CREATININE, CALCIUM in the last 72 hours.   Intake/Output Summary (Last 24 hours) at 03/15/2021 1302 Last data filed at 03/15/2021 0401 Gross per 24 hour  Intake 440 ml  Output 650 ml  Net -210 ml        Physical Exam: Vital Signs Blood pressure (!) 162/63, pulse (!) 53, temperature 97.6 F (36.4 C), temperature source Oral, resp. rate 16, height 6' 0.01" (1.829 m), weight 92.1 kg, SpO2 95 %.    General: awake, alert, appropriate, sitting up slightly in bed; NAD HENT: conjugate gaze; oropharynx moist CV: regular rate; no JVD Pulmonary: on 2L O2- by [place]- little cough- almost like clearing throat- but sounds clear- no W/R/R GI: soft, NT, ND, (+)BS Psychiatric: appropriate Neurological: alert Skin: No evidence of breakdown, no evidence of rash, loop site CDI, no hematoma palpated  Neurologic: Cranial nerves II through XII intact, motor strength is 4/5 in bilateral deltoid, bicep, tricep, grip, hip flexor, knee extensors, ankle dorsiflexor and plantar flexor  Musculoskeletal: Full range of motion in all 4 extremities. No joint swelling, left hip tenderness over greater troch , no pain with hip ROM    Assessment/Plan: 1. Functional deficits which require 3+ hours per day of interdisciplinary therapy in a comprehensive inpatient rehab setting. Physiatrist is providing close team supervision and 24 hour management of active medical problems listed  below. Physiatrist and rehab team continue to assess barriers to discharge/monitor patient progress toward functional and medical goals  Care Tool:  Bathing  Bathing activity did not occur: Safety/medical concerns Body parts bathed by patient: Right arm, Left arm, Chest, Abdomen, Front perineal area, Buttocks, Right upper leg, Left upper leg, Right lower leg, Left lower leg, Face   Body parts bathed by helper: Right arm, Left arm, Chest, Buttocks, Right upper leg, Left upper leg, Right lower leg, Left lower leg, Face     Bathing assist Assist Level: Minimal Assistance - Patient > 75%     Upper Body Dressing/Undressing Upper body dressing Upper body dressing/undressing activity did not occur (including orthotics): Safety/medical concerns What is the patient wearing?: Pull over shirt    Upper body assist Assist Level: Moderate Assistance - Patient 50 - 74%    Lower Body Dressing/Undressing Lower body dressing    Lower body dressing activity did not occur: Safety/medical concerns What is the patient wearing?: Pants, Incontinence brief     Lower body assist Assist for lower body dressing: Moderate Assistance - Patient 50 - 74%     Toileting Toileting    Toileting assist Assist for toileting: Moderate Assistance - Patient 50 - 74%     Transfers Chair/bed  transfer  Transfers assist  Chair/bed transfer activity did not occur: Safety/medical concerns  Chair/bed transfer assist level: Supervision/Verbal cueing Chair/bed transfer assistive device: Programmer, multimedia   Ambulation assist   Ambulation activity did not occur: Safety/medical concerns  Assist level: Minimal Assistance - Patient > 75% Assistive device: Walker-rolling Max distance: 29ft   Walk 10 feet activity   Assist     Assist level: 2 helpers Assistive device: Hand held assist   Walk 50 feet activity   Assist Walk 50 feet with 2 turns activity did not occur: Safety/medical  concerns         Walk 150 feet activity   Assist Walk 150 feet activity did not occur: Safety/medical concerns         Walk 10 feet on uneven surface  activity   Assist Walk 10 feet on uneven surfaces activity did not occur: Safety/medical concerns         Wheelchair     Assist Is the patient using a wheelchair?: Yes Type of Wheelchair: Manual    Wheelchair assist level: Dependent - Patient 0% Max wheelchair distance: 249ft    Wheelchair 50 feet with 2 turns activity    Assist        Assist Level: Dependent - Patient 0%   Wheelchair 150 feet activity     Assist      Assist Level: Dependent - Patient 0%   Blood pressure (!) 162/63, pulse (!) 53, temperature 97.6 F (36.4 C), temperature source Oral, resp. rate 16, height 6' 0.01" (1.829 m), weight 92.1 kg, SpO2 95 %.  Medical Problem List and Plan: 1. Functional deficits secondary to multifocal acute/early subacute cortical ischemia in the anterior right MCA territory, likely large vessel source from critical right ICA stenosis.  Status post right ICA stenting.  Left caudate small infarct>> procedure related versus occult atrial fibrillation.             -patient may shower             -ELOS/Goals: 10-15 days, supervision to min assist with PT, OT, and SLP  Continue CIR- PT, OT and SLP 2.  Antithrombotics: -DVT/anticoagulation:  Pharmaceutical: Lovenox             -antiplatelet therapy: Aspirin, Brilinta 3. Pain Management: Tylenol Left hip pain , exam c/w L troch bursitis , will add lidoderm patch at noc, therapy can address as well   1/16- wasn't c/o pain this AM except from bed/a littl eback pain- con't tylenol prn 4. Mood: LCSW to evaluate and provide emotional support             -antipsychotic agents: N/A 5. Neuropsych: This patient is capable of making decisions on his own behalf. 6. Skin/Wound Care: Routine skin care checks 7. Fluids/Electrolytes/Nutrition: Routine ins and outs and  follow up chemistries 8.  Persistent cough with imaging evidence of patchy bibasilar and right suprahilar opacities             -- On Unasyn for suspected multifocal pneumonia; possible aspiration- 7 d course planned              --Continue pulmonary toilet, mucolytics, schedule albuterol nebs              -persistent leukocytosis--but trending down             -currently afebrile  1/16- continued small cough- WBC down to 8.4k- con't regimen 9.  Frequent PVCs: Sees Dr. Stanford Breed as an outpatient  --  recommends loop recorder>> placed 1/10 10: Hypertension: monitor off Benicar  Vitals:   03/15/21 0401 03/15/21 0815  BP: (!) 162/63   Pulse: (!) 53   Resp: 18 16  Temp: 97.6 F (36.4 C)   SpO2: 95% 95%  ELevated off antihypertensive meds , will start low dose avapro, hospital pharmacy substitution for Benicar   1/16- BP elevated 160s/60s- just got Avapro started- so will monitor 11: Hyperlipidemia: Zetia and Lipitor 12: EtOH use: advised to drink no more than 1 alcoholic beverage dialy 13: Overweight: BMI = 28.5. Rec: weight loss, diet and exercise as appropriate 14: CKD stage 3a: serum Cr = 0.99. Monitor Poor intake , dislike dietary restrictions , will do 256ml fluid bolus today , recheck BMET   1/16- hates nectar thick liquids- encouraged pt to realize that need to prevent another pneumonia- con't diet restrictions 16: Hypokalemia: improved after oral repletion repeat next week  17: Anemia: asymptomatic --follow-up CBC and monitor 18: Thyroid nodule: follow-up ultrasound in one year recommended 19. Dysphagia: continue D3 with nectars, aspiration precautions             -advance per SLP  1/16- diet per SLP  20.  Lethargy , improved, slept better last noc  LOS: 6 days A FACE TO FACE EVALUATION WAS PERFORMED  Kaelene Elliston 03/15/2021, 1:02 PM

## 2021-03-15 NOTE — Progress Notes (Signed)
Occupational Therapy Session Note  Patient Details  Name: David [name] MRN: [id number] Date of Birth: June 13, 1940  Today's Date: 03/15/2021 OT Individual Time: 1006-1100 OT Individual Time Calculation (min): 54 min    Short Term Goals: Week 1:  OT Short Term Goal 1 (Week 1): Pt will complete UB dressing at supervision level sitting unsupported. OT Short Term Goal 2 (Week 1): Pt will complete LB dressing with min assist sit to stand for all aspects. OT Short Term Goal 3 (Week 1): Pt will complete toilet transfers with min assist using the RW and 3:1 OT Short Term Goal 4 (Week 1): Pt will complete two grooming tasks in standing at the sink with min guard assist for at least 3 mins.  Skilled Therapeutic Interventions/Progress Updates:    Pt received sitting in the w/c with no c/o pain. IV running- nurse entered and disconnected. Pt was taken to the therapy gym on 4W. Session focused on functional activity tolerance, standing balance, and oxygen dependence. Pt on 2L O2 [place] throughout session. Desaturations to 91% after standing but he returned to >95% with cueing for nasal breathing and increased awareness to breath. Pt completed several reciprocal stepping activities in standing with the RW for UE support with CGA. Blocked practice sit <> stands with no UE support to stand, 3x5 repetitions with extended rest breaks between. He returned to his room via w/c. Toileting tasks in standing to void urine with CGA. He returned to supine in bed with all needs met, bed alarm set.   Therapy Documentation Precautions:  Precautions Precautions: Fall, Other (comment) Precaution Comments: L hemiparesis Restrictions Weight Bearing Restrictions: No Vital Signs: Therapy Vitals Resp: 16 Oxygen Therapy SpO2: 95 % O2 Device: Nasal Cannula O2 Flow Rate (L/min): 2 L/min Pain: Pain Assessment Pain Scale: 0-10 Pain Score: 0-No pain  Therapy/Group: Individual Therapy  Curtis Sites 03/15/2021, 11:31  AM

## 2021-03-15 NOTE — Progress Notes (Signed)
Physical Therapy Session Note  Patient Details  Name: David [name] MRN: [id number] Date of Birth: 14-Nov-1940  Today's Date: 03/15/2021 PT Individual Time: 9450-3888; 1330-1430 PT Individual Time Calculation (min): 25 min and 60 mins  Short Term Goals: Week 1:  PT Short Term Goal 1 (Week 1): Pt will perform supine<>sit with min assist PT Short Term Goal 2 (Week 1): Pt will perform sit<>stands using LRAD with CGA PT Short Term Goal 3 (Week 1): Pt will perform stand pivot transfers using LRAD with CGA PT Short Term Goal 4 (Week 1): Pt will ambulate at least 38ft using LRAD with min assist of 1 PT Short Term Goal 5 (Week 1): Pt will navigate 4 stairs using HRs with min assist of 1  Skilled Therapeutic Interventions/Progress Updates:    Session 1: Patient received supine in bed, asleep, initially difficult to wake, but upon waking, agreeable to PT. He denies pain. Patient requiring frequent multimodal cuing to initially wake up and remain awake. He was able to come sit edge of bed with supervision and HOB elevated. Patient demonstrating fair+ static sitting balance edge of bed. MinA to don shoes-TotalA to tie. Patient able to safely reach outside his BOS to retrieve shoes from floor without LOB. Patient transferring to wc via stand pivot with MinA HHA. Remaining up in chair, seatbelt alarm on, call light within reach.   Session 2: Patient received supine in bed, asleep, difficult to wake, but agreeable to PT. He denies pain, but endorses fatigue. Patient initially reports feeling "swimmy headed." Vitals assessed and WNL, in flowsheet. Patient coming to sit edge of bed with supervision and transfer to wc via stand pivot with CGA. PT transporting patient in wc to therapy gym for time management and energy conservation. He completed a standing balance/ endurance task standing with U UE support on RW + CGA retrieving clothes pins and pinning them to basketball hoop. Patient able to reach outside BOS  without LOB, but was only able to remain standing for ~1 min at a time. O2 90%-94% on 2L O2. He required extended seated rest breaks between bouts for a total of 4 sets due to fatigue. He completed 2 mins, then 2x1 min on Kientron at 60cm/s. Patient remains severely deconditioned. He returned to his room- requested to use the urinal. Able to ambulate into the bathroom and use the urinal with CGA. Patient returning to bed, bed alarm on, call light within reach.   Therapy Documentation Precautions:  Precautions Precautions: Fall, Other (comment) Precaution Comments: L hemiparesis Restrictions Weight Bearing Restrictions: No   Therapy/Group: Individual Therapy  Karoline Caldwell, PT, DPT, CBIS  03/15/2021, 7:39 AM

## 2021-03-15 NOTE — Progress Notes (Signed)
Speech Language Pathology Daily Session Note  Patient Details  Name: David [name] MRN: [id number] Date of Birth: 15-Feb-1941  Today's Date: 03/15/2021 SLP Individual Time: 0768-0881 SLP Individual Time Calculation (min): 25 min and Today's Date: 03/15/2021 SLP Missed Time: 20 Minutes Missed Time Reason: Other (Comment);Nursing care (Respiratory therapy)  Short Term Goals: Week 1: SLP Short Term Goal 1 (Week 1): Patient will consume current diet with effective mastication, oral clearance, and without overt s/s of aspiration with min A cues to implement safe swallowing compensatory strategies SLP Short Term Goal 2 (Week 1): Patient will implement speech intelligiblity strategies at sentence level with min A cues to achieve >90% intelligiblity SLP Short Term Goal 3 (Week 1): Patient will use external memory strategies to recall functiona, novel information with min A verbal cues SLP Short Term Goal 4 (Week 1): Patient will complete mildly complex problem solving with min A cues SLP Short Term Goal 5 (Week 1): Patient will demonstrate awareness to errors during functional and familiar tasks with min A verbal cues  Skilled Therapeutic Interventions: Skilled ST treatment focused on swallowing and cognitive goals. Patient missed first 20 minutes of therapy due to nursing needs as well respiratory therapy care. Patient required min A verbal cues to utilize external aid (calendar) to orient to time (date/year). SLP thickened water to nectar thick consistency for PO trials. Pt required max verbal cues for recall of safe swallowing strategies as well as reasoning for thickened liquids. Patient consumed nectar thick liquids by straw with immediate cough following initial sips suspect due to taking multiple sips at a time requiring max A for implementing safe swallowing precautions. There were no overt s/s of aspiration with max verbal cues for use of small, SINGLE sips and implementing chin tuck positioning.  Recommend continuation of current diet. SLP printed handout containing safe swallowing strategies and placed across from bed to increase recall and carry over. Pt will continue to have full supervision during meals for use of these strategies. Patient was left in bed with alarm activated and immediate needs within reach at end of session. Continue per current plan of care.      Pain Pain Assessment Pain Scale: 0-10 Pain Score: 0-No pain  Therapy/Group: Individual Therapy  Patty Sermons 03/15/2021, 8:39 AM

## 2021-03-16 ENCOUNTER — Ambulatory Visit: Payer: Medicare Other | Admitting: Internal Medicine

## 2021-03-16 ENCOUNTER — Inpatient Hospital Stay (HOSPITAL_COMMUNITY): Payer: Medicare Other

## 2021-03-16 MED ORDER — ALBUTEROL SULFATE (2.5 MG/3ML) 0.083% IN NEBU: 3.0000 mL | INHALATION_SOLUTION | Freq: Four times a day (QID) | RESPIRATORY_TRACT | Status: DC | PRN

## 2021-03-16 MED ORDER — IRBESARTAN 75 MG PO TABS
75.0000 mg | ORAL_TABLET | Freq: Every day | ORAL | Status: DC
  Administered 2021-03-17 – 2021-03-21 (×5): 75 mg via ORAL
  Filled 2021-03-16 (×6): qty 1

## 2021-03-16 MED ORDER — IRBESARTAN 75 MG PO TABS
37.5000 mg | ORAL_TABLET | Freq: Once | ORAL | Status: AC
  Administered 2021-03-16: 37.5 mg via ORAL

## 2021-03-16 NOTE — Progress Notes (Signed)
Speech Language Pathology Weekly Progress and Session Note  Patient Details  Name: David [name] MRN: [id number] Date of Birth: 09-23-1940  Beginning of progress report period: March 10, 2021 End of progress report period: March 17, 2021  Short Term Goals: Week 1: SLP Short Term Goal 1 (Week 1): Patient will consume current diet with effective mastication, oral clearance, and without overt s/s of aspiration with min A cues to implement safe swallowing compensatory strategies SLP Short Term Goal 1 - Progress (Week 1): Progressing toward goal SLP Short Term Goal 2 (Week 1): Patient will implement speech intelligiblity strategies at sentence level with min A cues to achieve >90% intelligiblity SLP Short Term Goal 2 - Progress (Week 1): Met SLP Short Term Goal 3 (Week 1): Patient will use external memory strategies to recall functiona, novel information with min A verbal cues SLP Short Term Goal 3 - Progress (Week 1): Met SLP Short Term Goal 4 (Week 1): Patient will complete mildly complex problem solving with min A cues SLP Short Term Goal 4 - Progress (Week 1): Met SLP Short Term Goal 5 (Week 1): Patient will demonstrate awareness to errors during functional and familiar tasks with min A verbal cues SLP Short Term Goal 5 - Progress (Week 1): Met  New Short Term Goals: Week 2: SLP Short Term Goal 2 (Week 2): STG=LTG due to ELOS  Weekly Progress Updates: Patient has made functional gains and has met 4 of 5 STGs this reporting period. Currently, patient requires min A verbal cues in regards to mildly complex problem solving, functional recall, error awareness, and increased processing time to complete functional and complex tasks accurately and safely. Patient currently implements speech intelligibility strategies at conversation level with modified independence-to-supervision A and intelligibility appears to decrease with fatigue. Patient is currently consuming a dysphagia 3 diet and nectar  thick liquids and requires min-to-mod A verbal cues for recall and implementation of safe swallowing precautions and strategies to minimize aspiration risk. Patient would benefit from continued skilled SLP intervention to maximize cognitive, speech, and swallow functioning and overall functional independence prior to discharge. Pt is expected to continue to improve with ongoing skilled SLP intervention.    Intensity: Minumum of 1-2 x/day, 30 to 90 minutes Frequency: 3 to 5 out of 7 days Duration/Length of Stay: ~2 weeks Treatment/Interventions: Cognitive remediation/compensation;Internal/external aids;Speech/Language facilitation;Cueing hierarchy;Dysphagia/aspiration precaution training;Functional tasks;Patient/family education;Therapeutic Activities  Patty Sermons 03/16/2021, 4:23 PM

## 2021-03-16 NOTE — Progress Notes (Signed)
Occupational Therapy Weekly Progress Note  Patient Details  Name: David [name] MRN: [id number] Date of Birth: 29-Nov-1940  Beginning of progress report period: March 10, 2021 End of progress report period: March 16, 2021  Today's Date: 03/16/2021 OT Individual Time: 2595-6387 OT Individual Time Calculation (min): 54 min    Patient has met 3 of 4 short term goals.  Pt continues to make stedy progress with OT but is still limited overall by endurance, decreased  balance, and LUE weakness.  He is able to complete most bathing at min guard assist using the grab bars in the shower for support, with min assist for transfer to the walk-in shower with use of the RW for support.  He is able to complete UB bathing with min assist for pullover shirt secondary to not being able to pull it down in the back efficiently.  He was able to complete LB dressing with mod assist sit to stand secondary to not being able to efficiently donn his socks.  He is able to complete all toilet transfers with min assist using the RW for support as well.  Oxygen sats are still dropping below 90% on room air with activity.  They usually decrease to 86-87% with resting sats on room air at 90-91%.  He is currently using 2Ls nasal cannula at times during activity to keep sats above 92%.  Slight cognitive memory deficits and processing are still present, but much more improved since initial eval.  LUE function is currently at a diminished level overall.  He continues to demonstrate decreased FM coordination for tying shoes as well as decreased strength for opening items when place in the RUE.   Feel he is on target to reach current established LTGs set at supervision level overall with continued CIR level OT.  Will continue with current OT POC until expected discharge with ongoing pt/family education as well.   Patient continues to demonstrate the following deficits: muscle weakness and muscle paralysis, impaired timing and  sequencing, unbalanced muscle activation, and decreased coordination, decreased awareness, decreased memory, and delayed processing, and decreased standing balance, decreased postural control, hemiplegia, and decreased balance strategies and therefore will continue to benefit from skilled OT intervention to enhance overall performance with BADL and Reduce care partner burden.  Patient progressing toward long term goals..  Continue plan of care.  OT Short Term Goals Week 2:  OT Short Term Goal 1 (Week 2): Pt will complete UB dressing at supervision level sitting unsupported. OT Short Term Goal 2 (Week 2): Pt will complete LB dressing with min assist sit to stand for all aspects. OT Short Term Goal 3 (Week 2): Pt will complete two grooming tasks in standing at the sink with min guard assist for at least 3 mins.  Skilled Therapeutic Interventions/Progress Updates:    Pt worked on shower and dressing during session.  Supervision for transfer from supine to sit with min assist for sit to stand from the lower hospital bed.  He was able to then complete functional mobility to the toilet with min guard and standing to urinate.  Noted some bladder incontinence once reaching the toilet.  Once complete, he transferred over to the tub bench for shower at min assist level.  Bathing was completed at min guard assist overall.  Pt washed his hair and part of his UB and peri area but did not not wash his lower legs and feet.  When asked, he stated "I didn't wash them", as he was not  aware that he hadn't washed them with soap.  Sit to stand for washing front and back peri area was at min guard assist level with use of the grab bars in the shower for support.  All bathing was completed on room air as pt was 94% when resting in the bed without O2 as he had pushed it to the side of his face.  Once he transferred out of the shower, he was able to complete transfer out to the EOB with min assist using the RW.  Oxygen sats at 88%  on room air after sitting, with a quick increase back to 90% after less than 30 seconds.  He was able to donn his brief and pants with increased time and min assist sit to stand.  Min assist was also needed for donning his pullover shirt secondary to not being able to pull it down in the back.  Increased fatigue needed with dyspnea 2/4 with pt transitioning to supine to rest before attempting donning his gripper socks.  He needed total assist for donning gripper socks in the bed.  Oxygen was re-applied secondary to dyspnea and pt was left with his daughter and son in law in the room.  Call button in reach.   Therapy Documentation Precautions:  Precautions Precautions: Fall, Other (comment) Precaution Comments: L hemiparesis Restrictions Weight Bearing Restrictions: No  Pain:  No report of pain during session  ADL: See Care Tool Section for some details of mobility and selfcare  Therapy/Group: Individual Therapy  Woody Kronberg OTR/L 03/16/2021, 3:34 PM

## 2021-03-16 NOTE — Progress Notes (Signed)
PROGRESS NOTE   Subjective/Complaints: No issues overnite, cough much better, would like to discontinue thickener with liquids,still working with SLP   ROS-  Pt denies SOB, abd pain, CP, N/V/C/D, and vision changes   Objective:   No results found. No results for input(s): WBC, HGB, HCT, PLT in the last 72 hours. No results for input(s): NA, K, CL, CO2, GLUCOSE, BUN, CREATININE, CALCIUM in the last 72 hours.   Intake/Output Summary (Last 24 hours) at 03/16/2021 0830 Last data filed at 03/16/2021 0421 Gross per 24 hour  Intake 579.84 ml  Output 500 ml  Net 79.84 ml         Physical Exam: Vital Signs Blood pressure (!) 161/62, pulse 62, temperature 98.8 F (37.1 C), temperature source Oral, resp. rate 16, height 6' 0.01" (1.829 m), weight 92.1 kg, SpO2 95 %.   General: No acute distress Mood and affect are appropriate Heart: Regular rate and rhythm no rubs murmurs or extra sounds Lungs: Clear to auscultation, breathing unlabored, no rales or wheezes Abdomen: Positive bowel sounds, soft nontender to palpation, nondistended Extremities: No clubbing, cyanosis, or edema Skin: No evidence of breakdown, no evidence of rash   Skin: No evidence of breakdown, no evidence of rash, loop site CDI, no hematoma palpated  Neurologic: Cranial nerves II through XII intact, motor strength is 4/5 in bilateral deltoid, bicep, tricep, grip, hip flexor, knee extensors, ankle dorsiflexor and plantar flexor  Musculoskeletal: Full range of motion in all 4 extremities. No joint swelling, left hip tenderness over greater troch , no pain with hip ROM    Assessment/Plan: 1. Functional deficits which require 3+ hours per day of interdisciplinary therapy in a comprehensive inpatient rehab setting. Physiatrist is providing close team supervision and 24 hour management of active medical problems listed below. Physiatrist and rehab team continue  to assess barriers to discharge/monitor patient progress toward functional and medical goals  Care Tool:  Bathing  Bathing activity did not occur: Safety/medical concerns Body parts bathed by patient: Right arm, Left arm, Chest, Abdomen, Front perineal area, Buttocks, Right upper leg, Left upper leg, Right lower leg, Left lower leg, Face   Body parts bathed by helper: Right arm, Left arm, Chest, Buttocks, Right upper leg, Left upper leg, Right lower leg, Left lower leg, Face     Bathing assist Assist Level: Minimal Assistance - Patient > 75%     Upper Body Dressing/Undressing Upper body dressing Upper body dressing/undressing activity did not occur (including orthotics): Safety/medical concerns What is the patient wearing?: Pull over shirt    Upper body assist Assist Level: Moderate Assistance - Patient 50 - 74%    Lower Body Dressing/Undressing Lower body dressing    Lower body dressing activity did not occur: Safety/medical concerns What is the patient wearing?: Pants, Incontinence brief     Lower body assist Assist for lower body dressing: Moderate Assistance - Patient 50 - 74%     Toileting Toileting    Toileting assist Assist for toileting: Moderate Assistance - Patient 50 - 74%     Transfers Chair/bed transfer  Transfers assist  Chair/bed transfer activity did not occur: Safety/medical concerns  Chair/bed transfer assist level: Supervision/Verbal  cueing Chair/bed transfer assistive device: Programmer, multimedia   Ambulation assist   Ambulation activity did not occur: Safety/medical concerns  Assist level: Minimal Assistance - Patient > 75% Assistive device: Walker-rolling Max distance: 21ft   Walk 10 feet activity   Assist     Assist level: 2 helpers Assistive device: Hand held assist   Walk 50 feet activity   Assist Walk 50 feet with 2 turns activity did not occur: Safety/medical concerns         Walk 150 feet  activity   Assist Walk 150 feet activity did not occur: Safety/medical concerns         Walk 10 feet on uneven surface  activity   Assist Walk 10 feet on uneven surfaces activity did not occur: Safety/medical concerns         Wheelchair     Assist Is the patient using a wheelchair?: Yes Type of Wheelchair: Manual    Wheelchair assist level: Dependent - Patient 0% Max wheelchair distance: 267ft    Wheelchair 50 feet with 2 turns activity    Assist        Assist Level: Dependent - Patient 0%   Wheelchair 150 feet activity     Assist      Assist Level: Dependent - Patient 0%   Blood pressure (!) 161/62, pulse 62, temperature 98.8 F (37.1 C), temperature source Oral, resp. rate 16, height 6' 0.01" (1.829 m), weight 92.1 kg, SpO2 95 %.  Medical Problem List and Plan: 1. Functional deficits secondary to multifocal acute/early subacute cortical ischemia in the anterior right MCA territory, likely large vessel source from critical right ICA stenosis.  Status post right ICA stenting.  Left caudate small infarct>> procedure related versus occult atrial fibrillation.             -patient may shower             -ELOS/Goals: 10-15 days, supervision to min assist with PT, OT, and SLP  Continue CIR- PT, OT and SLP 2.  Antithrombotics: -DVT/anticoagulation:  Pharmaceutical: Lovenox             -antiplatelet therapy: Aspirin, Brilinta 3. Pain Management: Tylenol Left hip pain , exam c/w L troch bursitis , will add lidoderm patch at noc, therapy can address as well    4. Mood: LCSW to evaluate and provide emotional support             -antipsychotic agents: N/A 5. Neuropsych: This patient is capable of making decisions on his own behalf. 6. Skin/Wound Care: Routine skin care checks 7. Fluids/Electrolytes/Nutrition: Routine ins and outs and follow up chemistries 8.  Persistent cough with imaging evidence of patchy bibasilar and right suprahilar opacities              -- On Unasyn for suspected multifocal pneumonia; possible aspiration- 7 d course planned - recheck CXR, plan to d/c unasyn in am              --pt states cough has improved "100%"  afebrile  1/16- continued small cough- WBC down to 8.4k- con't regimen 9.  Frequent PVCs: Sees Dr. Stanford Breed as an outpatient  --recommends loop recorder>> placed 1/10 10: Hypertension: monitor off Benicar  Vitals:   03/16/21 0423 03/16/21 0809  BP: (!) 161/62   Pulse: 62   Resp: 17 16  Temp: 98.8 F (37.1 C)   SpO2: 96% 95%  Increase avapro to 75mg  daily on 1/18  11: Hyperlipidemia: Zetia  and Lipitor 12: EtOH use: advised to drink no more than 1 alcoholic beverage dialy 13: Overweight: BMI = 28.5. Rec: weight loss, diet and exercise as appropriate 14: CKD stage 3a: serum Cr = 0.99. Monitor Poor intake , dislike dietary restrictions , will do 268ml fluid bolus today , recheck BMET   1/16- hates nectar thick liquids- encouraged pt to realize that need to prevent another pneumonia- con't diet restrictions 16: Hypokalemia: improved after oral repletion repeat next week  17: Anemia: asymptomatic, mild stable  CBC Latest Ref Rng & Units 03/12/2021 03/20/2021 03/08/2021  WBC 4.0 - 10.5 K/uL 8.4 16.3(H) 18.3(H)  Hemoglobin 13.0 - 17.0 g/dL 10.4(L) 10.6(L) 11.0(L)  Hematocrit 39.0 - 52.0 % 33.1(L) 32.4(L) 33.2(L)  Platelets 150 - 400 K/uL 384 264 260    18: Thyroid nodule: follow-up ultrasound in one year recommended 19. Dysphagia: continue D with nectars, aspiration precautions             -advance per SLP  1/16- diet per SLP, dicuss repeat MBS in team conf tomorrow   20.  Lethargy , improved, building endurance for therapy   LOS: 7 days A FACE TO FACE EVALUATION WAS PERFORMED  Charlett Blake 03/16/2021, 8:30 AM

## 2021-03-16 NOTE — Progress Notes (Signed)
Speech Language Pathology Daily Session Note  Patient Details  Name: David [name] MRN: [id number] Date of Birth: March 11, 1940  Today's Date: 03/16/2021 SLP Individual Time: 0800-0900 SLP Individual Time Calculation (min): 60 min  Short Term Goals: Week 1: SLP Short Term Goal 1 (Week 1): Patient will consume current diet with effective mastication, oral clearance, and without overt s/s of aspiration with min A cues to implement safe swallowing compensatory strategies SLP Short Term Goal 2 (Week 1): Patient will implement speech intelligiblity strategies at sentence level with min A cues to achieve >90% intelligiblity SLP Short Term Goal 3 (Week 1): Patient will use external memory strategies to recall functiona, novel information with min A verbal cues SLP Short Term Goal 4 (Week 1): Patient will complete mildly complex problem solving with min A cues SLP Short Term Goal 5 (Week 1): Patient will demonstrate awareness to errors during functional and familiar tasks with min A verbal cues  Skilled Therapeutic Interventions: Skilled ST treatment focused on cognitive and swallowing goals. Nurse present on arrival and administered medications provided in the form of crushed in applesauce. Patient consumed with nectar thick liquid rinse with min A verbal cues to implement safe swallow strategies (chin tuck). Pt had no overt s/sx of aspiration with nectar thick liquids this encounter. Continue current diet. SLP then facilitated session by providing sup A verbal cues for medication label comprehension, min A verbal cues for decision making and problem solving with hypothetical scenarios, and sup-to-min A for error awareness of BID pillbox organization errors. Pt benefited from additional processing time and redirection as session progressed secondary to possible cognitive fatigue. Patient overall perceived as 100% intelligible at the conversation level with mod I for implementation of speech intelligibility  strategies. Patient was left in bed with alarm activated and immediate needs within reach at end of session. Continue per current plan of care.      Pain Pain Assessment Pain Scale: 0-10 Pain Score: 0-No pain  Therapy/Group: Individual Therapy  Patty Sermons 03/16/2021, 8:17 AM

## 2021-03-16 NOTE — Progress Notes (Signed)
Physical Therapy Session Note  Patient Details  Name: David [name] MRN: [id number] Date of Birth: 1940-12-26  Today's Date: 03/16/2021 PT Individual Time: 2440-1027 and 1540-1620 PT Individual Time Calculation (min): 45 min  and 40 min  Short Term Goals: Week 1:  PT Short Term Goal 1 (Week 1): Pt will perform supine<>sit with min assist PT Short Term Goal 2 (Week 1): Pt will perform sit<>stands using LRAD with CGA PT Short Term Goal 3 (Week 1): Pt will perform stand pivot transfers using LRAD with CGA PT Short Term Goal 4 (Week 1): Pt will ambulate at least 66ft using LRAD with min assist of 1 PT Short Term Goal 5 (Week 1): Pt will navigate 4 stairs using HRs with min assist of 1  Skilled Therapeutic Interventions/Progress Updates:    Session 1: Pt received supine in bed with his wife, Vickii Chafe, and daughter, Manuela Schwartz, present. Pt agreeable to therapy session. Received and maintained on 2L of O2 via nasal cannula throughout session - SpO2 93% sitting EOB at beginning and maintains at or above 90% throughout session with mobility. Supine>sitting R EOB, HOB flat and not using bedrails, supervision. L stand pivot EOB>w/c no AD but using UE support on w/c armrests with light min assist for lifting and balance - cuing to stand fully upright when stepping.  Transported to/from gym in w/c for time management and energy conservation. Gait training 150ft using RW with light min assist/CGA for steadying - continues to demo slow gait speed though improving, decreased  BLE step lengths and foot clearance though improving, and excessive trunk flexion/kyphotic posture - cuing for improvement throughout. SpO2 92% after - requires seated rest break due to reports of feeling "slightly short of breath" due to activity. Repeated same gait training path requiring 40min37sec to achieve that distance - continued cuing as described above - SpO2 90% after requring seated rest break to recover to 93%. Transported back to room.  Short distance ~16ft ambulatory transfer w/c>EOB using RW with CGA. Sit>supine supervision. Pt left supine in bed with needs in reach, bed alarm on, and returned to wall 2L O2.   Session 2: Pt received asleep, supine in bed and upon awakening agreeable to therapy session. Pt received and maintained on 2L of O2 via nasal cannula - SpO2 91% near beginning of session and decreased as low as 88% after stair navigation. Supine>sitting R EOB, HOB partially elevated, with supervision. Sitting EOB, good sitting balance, while therapist donned shoes total assist for time management. Sit>stand EOB>RW with CGA and pt slow to rise to standing with delayed trunk/hip extension. Gait training ~67ft in room to w/c using RW with CGA. Transported to/from gym in w/c for time management and energy conservation. Gait training 139ft using RW with CGA - continues to have slow gait speed with decreased B LE step lengths and foot clearance as well as kyphotic posturing. Educated pt on importance of improving posture to increase lung expansion. Pt reports sudden urge to use bathroom - therapist provided urinal - sit<>stand using RW CGA and standing with CGA pt managed LB clothing with mod assist for L side and continent of bladder. Stair Haematologist x2 (6" height) using B HRs with reciprocal pattern targeting L LE NMR and strengthening - no signs of knee instability and good foot clearance to place foot on step during ascent. Gait training 60ft using RW with CGA as described above. Transported back to room and pt agreeable to remain sitting up in w/c. Left  with needs in reach, seat belt alarm on, and returned to wall 2L O2.  Therapy Documentation Precautions:  Precautions Precautions: Fall, Other (comment) Precaution Comments: L hemiparesis Restrictions Weight Bearing Restrictions: No   Pain:  Session 1: Denies pain during session.  Session 2: Denies pain during  session.    Therapy/Group: Individual Therapy  Tawana Scale , PT, DPT, NCS, CSRS  03/16/2021, 12:20 PM

## 2021-03-17 MED ORDER — AMOXICILLIN-POT CLAVULANATE 875-125 MG PO TABS
1.0000 | ORAL_TABLET | Freq: Two times a day (BID) | ORAL | Status: DC
  Administered 2021-03-17 – 2021-03-23 (×13): 1 via ORAL
  Filled 2021-03-17 (×13): qty 1

## 2021-03-17 NOTE — Patient Care Conference (Signed)
Inpatient RehabilitationTeam Conference and Plan of Care Update Date: 03/17/2021   Time: 10:31 AM    Patient Name: David [name]      Medical Record Number: [id number]  Date of Birth: 1940-03-23 Sex: Male         Room/Bed: 5C02C/5C02C-01 Payor Info: Payor: [place] / Plan: [facility] MEDICARE / Product Type: *No Product type* /    Admit Date/Time:  03/19/2021  5:06 PM  Primary Diagnosis:  Acute right MCA stroke [facility])  Hospital Problems: Principal Problem:   Acute right MCA stroke [facility])    Expected Discharge Date: Expected Discharge Date: 03/25/21  Team Members Present: Physician leading conference: Dr. Alysia Penna Social Worker Present: Erlene Quan, [facility] Nurse Present: Dorien Chihuahua, RN PT Present: Page Spiro, PT OT Present: Clyda Greener, OT SLP Present: Sherren Kerns, SLP PPS Coordinator present : Gunnar Fusi, SLP     Current Status/Progress Goal Weekly Team Focus  Bowel/Bladder   pt continent to b/b uses the urinal with some accidentally episodes  pt to remain continent      Swallow/Nutrition/ Hydration   Dys 3, nectar thick liquids min-to-mod A for swallow safety  sup A for swallow safety, least restrictive diet  Tolerance of dys 3 diet, nectar thick liquids, trials of thin liquids when appropriate   ADL's   Supervision for UB bathing with min assist for UB dressing.  Min guard to min assist for LB bathing with min assist for dressing sit to stand.  Transfers are currently min assist as well with use of the RW for support.  Decreased endurance overall with O2 dependency with activity as they drop below 90%.  supervision overall  selfcare retraining, transfer training, DME education, energy conservation strategies, balance retraining, therapeutic activites   Mobility   supervision supine<>sit, CGA/min A sit<>stand and stand pivot using RW, gait up to 198ft using RW CGA/min A (therapist managing O2 line), CGA/min A 8 stairs using B HRs  -  continues to require supplemental O2 with SpO2 decreasing to 89-90% during gait and stair training  supervision overall at ambulatory level  activity tolerance, pt/family education, transfer training, standing tolerance and balance, B LE strengthening, gait training   Communication   mod I-to-sup A. Fluctuates with fatigue  mod I  speech intelligiblity strategies   Safety/Cognition/ Behavioral Observations  min A  sup A  problem solving, recall, awareness   Pain   pt denies pain  pt denies pain      Skin   healed R groin insicion site, Left chest open to air           Discharge Planning:  discharging home with spouse, daughter and other family members.   Team Discussion: Patient with pna and right MCA CVA. Continue IV abx a week longer per MD. Anticipate will need home O2 as he continues to desat with activity. Remains incontinent despite timed toileting protocol. Progress limited by poor endurance although cognition has improved.  Patient on target to meet rehab goals: yes, currently needs min guard for showering and min-mod assist for lower body care. New onset of trigger fingers impairs dressing. Needs CGA for sit-stand and stand pivot transfers. Able to ambulate up to 160' and manage steps with min assist. Goals for discharge set for supervision.  *See Care Plan and progress notes for long and short-term goals.   Revisions to Treatment Plan:  RMT with SLP added Energy conservation strategies incorporated into care   Teaching Needs: Safety, transfers, toileting, skin care,  medication management, secondary risk management, etc.   Current Barriers to Discharge: Decreased caregiver support  Possible Resolutions to Barriers: Family education with wife and daughter [facility] follow up services recommended if he does not have transportation for OP.     Medical Summary Current Status: Remains on abx for PNA, converting to po, labile HTN, no further bleeding from loop site  Barriers to  Discharge: Medical stability;IV antibiotics   Possible Resolutions to Barriers/Weekly Focus: spling for Left hand, may , need home O2, work on endurance and respiratory muscle training   Continued Need for Acute Rehabilitation Level of Care: The patient requires daily medical management by a physician with specialized training in physical medicine and rehabilitation for the following reasons: Direction of a multidisciplinary physical rehabilitation program to maximize functional independence : Yes Medical management of patient stability for increased activity during participation in an intensive rehabilitation regime.: Yes Analysis of laboratory values and/or radiology reports with any subsequent need for medication adjustment and/or medical intervention. : Yes   I attest that I was present, lead the team conference, and concur with the assessment and plan of the team.   Dorien Chihuahua B 03/17/2021, 7:46 PM

## 2021-03-17 NOTE — Progress Notes (Addendum)
Patient ID: David [name], male   DOB: 10-Apr-1940, 81 y.o.   MRN: [id number]  Team Conference Report to Patient/Family  Team Conference discussion was reviewed with the patient and caregiver, including goals, any changes in plan of care and target discharge date.  Patient and caregiver express understanding and are in agreement.  The patient has a target discharge date of 03/25/21.   Sw met with patient and family at bedside. Patient transitioning from IV abx to oral abx. Patient will remain on 02 at discharge, sw will order a home concentrator and portable tank. Family requesting HH at discharge. Family education scheduled on 1/25 9-12 Dyanne Iha 03/17/2021, 1:13 PM

## 2021-03-17 NOTE — Progress Notes (Signed)
Physical Therapy Session Note  Patient Details  Name: David [name] MRN: [id number] Date of Birth: [birth year]  Today's Date: 03/17/2021 PT Individual Time: 5993-5701 PT Individual Time Calculation (min): 55 min   Short Term Goals: Week 1:  PT Short Term Goal 1 (Week 1): Pt will perform supine<>sit with min assist PT Short Term Goal 2 (Week 1): Pt will perform sit<>stands using LRAD with CGA PT Short Term Goal 3 (Week 1): Pt will perform stand pivot transfers using LRAD with CGA PT Short Term Goal 4 (Week 1): Pt will ambulate at least 75ft using LRAD with min assist of 1 PT Short Term Goal 5 (Week 1): Pt will navigate 4 stairs using HRs with min assist of 1  Skilled Therapeutic Interventions/Progress Updates:    Pt received long sitting in bed having finished breakfast. Pt agreeable to therapy session. Received and maintained on 2L of O2 via nasal cannula - SpO2 maintaining >92% throughout mobility. Supine>sitting R EOB, HOB elevated and using bedrail, supervision and increased time this morning. Sitting EOB, supervision for sitting balance with no instability noted, while therapist provided total assist to don TED hose and shoes for time management. Pt reports need to void bladder. Sit<>stands EOB<>RW with CGA - on 1st stand required 2nd attempt to rise up fully with cuing for increased anterior trunk lean. Standing with CGA and UE support on RW as needed - pt managed LB clothing with min assist for L side of pants and continently voided bladder in urinal. Gait training ~70ft using RW in room to sink and then to wheelchair with CGA/light min assist for balance - standing hand hygiene at sink with CGA.  Transported to/from gym in w/c for time management and energy conservation.  Gait training 266ft using RW with CGA/light min assist - continues to demo decreased gait speed with slow movements and decreased B LE foot clearance as well as excessive thoracic kyphosis - cuing for  improvement.  Repeated step up/down on/off 1st 6" height step using B HR support with light min assist x10 reps per LE without seated rest targeting  BLE strength and endurance - SpO2 93% and HR 109bpm after recovered to 85bpm.  Transported back to room and left seated in w/c with needs in reach, seat belt alarm on, and returned to wall 2L O2.  Therapy Documentation Precautions:  Precautions Precautions: Fall, Other (comment) Precaution Comments: L hemiparesis Restrictions Weight Bearing Restrictions: No   Pain: No reports of pain throughout session.   Therapy/Group: Individual Therapy  Tawana Scale , PT, DPT, NCS, CSRS  03/17/2021, 7:58 AM

## 2021-03-17 NOTE — Progress Notes (Signed)
Occupational Therapy Session Note  Patient Details  Name: David [name] MRN: [id number] Date of Birth: 17-Nov-1940  Today's Date: 03/17/2021 OT Individual Time: 1002-1030 OT Individual Time Calculation (min): 28 min    Short Term Goals: Week 2:  OT Short Term Goal 1 (Week 2): Pt will complete UB dressing at supervision level sitting unsupported. OT Short Term Goal 2 (Week 2): Pt will complete LB dressing with min assist sit to stand for all aspects. OT Short Term Goal 3 (Week 2): Pt will complete two grooming tasks in standing at the sink with min guard assist for at least 3 mins.  Skilled Therapeutic Interventions/Progress Updates:    Session 1: (1002-1030)  Pt in wheelchair to start session with family present. No report of pain to start or during session.  He was able to complete dressing during session.  He was able to doff the pullover shirt with increased time and supervision.  He then needed min assist for donning a pullover shirt.  He needed min assist for sit to stand to remove his dirty brief and pants.  He then washed his front and back peri area at the same level.  Min assist for donning new brief and pants with increased time.  Pt with O2 sats at 93% on 2Ls with activity but dyspnea 3/4.  Finished session with pt donning his shoes with setup but therapist having to tie them with total assist.  Finished session with pt resting in the wheelchair and with the call button and phone in reach.  Safety alarm in place as well.  Session 2: (1302-1406)  No report of pain during session.  Pt in wheelchair to start with request to use the toilet.  He was able to complete ambulation to the toilet with min assist using the RW for support and stand to urinate at min guard assist.  Once complete, he was able to then ambulate over to the sink where he washed his hands in standing at min guard as well.  Dyspnea at 2/4 during these tasks with O2 at 2Ls and sats at 92% once complete.  After a brief rest  in sitting with BP checked at 168/73, he was taken down to the ortho gym via wheelchair where he focused on LUE strengthening with use of the UE ergonometer.  He was able to complete 3 intervals of 2-3 mins with resistance on level 8.  First interval was completed using BUEs peddling forward for 3 mins and RPMs maintained at level 15 or slightly higher.  Oxygen sats checked again on 2Ls nasal cannula at 95%.  He completed then next two sets at 2 mins each with isolated LUE use only.  He was able to complete 1 peddling forward and then 1 peddling backwards with RPMs maintained around 8-10.  Occasional re-gripping was noted in the LUE.  O2 was removed for the last 2 sets with oxygen checked at 95% on room air after each set and HR up into the 115 BPM range.  HR decreased with rest to below 100 BPM.  Next, had him complete Nine Hole Peg Test for coordination with both the right and left hands.  He was able to complete all pegs in 38 seconds with the right, but was unable to complete any with the left after 90 seconds.  Issued small foam pieces for pt to work on picking up in his room for increased LUE coordination.  Did not issue strengthening exercises at this time secondary to increased  inflammation of trigger finger.  Will continue to monitor, but will likely need to be addressed outside of the hospital.  Returned to the room at the end of the session with pt left sitting up and SLP in the room for next session.  Call button in reach and O2 placed back on pt as well.   Therapy Documentation Precautions:  Precautions Precautions: Fall, Other (comment) Precaution Comments: L hemiparesis Restrictions Weight Bearing Restrictions: No Pain: Pain Assessment Pain Scale: Faces Pain Score: 0-No pain ADL: See Care Tool Section for some details of mobility and selfcare   Therapy/Group: Individual Therapy  Jhane Lorio OTR/L 03/17/2021, 12:08 PM

## 2021-03-17 NOTE — Progress Notes (Addendum)
Speech Language Pathology Daily Session Note  Patient Details  Name: David [name] MRN: [id number] Date of Birth: 03/17/1940  Today's Date: 03/17/2021 SLP Individual Time: 1405-1500 SLP Individual Time Calculation (min): 55 min  Short Term Goals: Week 2: SLP Short Term Goal 2 (Week 2): STG=LTG due to ELOS  Skilled Therapeutic Interventions:  Pt seen for skilled ST with focus on swallowing and cognitive goals, upright in wheelchair finishing OT session. Pt agreeable to PO trials of ice chips along with cold NTL water. Pt noted with baseline occasional wet vocal quality before introduction of PO, able to clear and expectorate at time with cued throat clear. Pt continues to requiring min A cues for use of swallow strategies throughout intake. Pt tolerating NTL with no overt s/s aspiration this date. Pt consuming ice chips x15 with one delayed cough episode, requiring cues to consume single ice chip (often taking 2-3 onto spoon initially). Speech intelligibility 100% at conversation level with mod I use of compensatory strategies. Pt orientated to time, place and recent medical events independently. SLP facilitating functional problem solving task for current and discharge environment by providing overall min A cues. SLP assisting pt transfer to bed per request, left in bed with alarm set and all needs within reach. Cont ST POC.   Pain Pain Assessment Pain Scale: 0-10 Pain Score: 0-No pain  Therapy/Group: Individual Therapy  Dewaine Conger 03/17/2021, 3:01 PM

## 2021-03-17 NOTE — Progress Notes (Signed)
PROGRESS NOTE   Subjective/Complaints:  Compared CXR to prior to 02/26/21, improving but still with basilar opacities  ROS-  Pt denies SOB, abd pain, CP, N/V/C/D, and vision changes   Objective:   DG CHEST PORT 1 VIEW  Result Date: 03/16/2021 CLINICAL DATA:  Cough EXAM: PORTABLE CHEST 1 VIEW COMPARISON:  03/07/2021 FINDINGS: Persistent opacities at the lung bases. Additional patchy density elsewhere. No significant pleural effusion. No pneumothorax. Similar cardiomediastinal contours. Hiatal hernia. IMPRESSION: Bilateral opacities primarily at the lung bases remain present suspicious for pneumonia. Electronically Signed   By: Macy Mis M.D.   On: 03/16/2021 09:12   No results for input(s): WBC, HGB, HCT, PLT in the last 72 hours. No results for input(s): NA, K, CL, CO2, GLUCOSE, BUN, CREATININE, CALCIUM in the last 72 hours.   Intake/Output Summary (Last 24 hours) at 03/17/2021 0925 Last data filed at 03/17/2021 0850 Gross per 24 hour  Intake 720 ml  Output 1225 ml  Net -505 ml         Physical Exam: Vital Signs Blood pressure (!) 158/69, pulse 65, temperature 97.9 F (36.6 C), temperature source Oral, resp. rate 16, height 6' 0.01" (1.829 m), weight 92.1 kg, SpO2 95 %.   General: No acute distress Mood and affect are appropriate Heart: Regular rate and rhythm no rubs murmurs or extra sounds Lungs: Clear to auscultation, breathing unlabored, no rales or wheezes Abdomen: Positive bowel sounds, soft nontender to palpation, nondistended Extremities: No clubbing, cyanosis, or edema Skin: No evidence of breakdown, no evidence of rash   Skin: No evidence of breakdown, no evidence of rash, loop site CDI, no hematoma palpated  Neurologic: Cranial nerves II through XII intact, motor strength is 4/5 in bilateral deltoid, bicep, tricep, grip, hip flexor, knee extensors, ankle dorsiflexor and plantar  flexor  Musculoskeletal: Full range of motion in all 4 extremities. No joint swelling, left hip tenderness over greater troch , no pain with hip ROM    Assessment/Plan: 1. Functional deficits which require 3+ hours per day of interdisciplinary therapy in a comprehensive inpatient rehab setting. Physiatrist is providing close team supervision and 24 hour management of active medical problems listed below. Physiatrist and rehab team continue to assess barriers to discharge/monitor patient progress toward functional and medical goals  Care Tool:  Bathing  Bathing activity did not occur: Safety/medical concerns Body parts bathed by patient: Right arm, Left arm, Chest, Abdomen, Front perineal area, Buttocks, Right upper leg, Left upper leg, Right lower leg, Left lower leg, Face   Body parts bathed by helper: Right arm, Left arm, Chest, Buttocks, Right upper leg, Left upper leg, Right lower leg, Left lower leg, Face     Bathing assist Assist Level: Contact Guard/Touching assist     Upper Body Dressing/Undressing Upper body dressing Upper body dressing/undressing activity did not occur (including orthotics): Safety/medical concerns What is the patient wearing?: Pull over shirt    Upper body assist Assist Level: Minimal Assistance - Patient > 75%    Lower Body Dressing/Undressing Lower body dressing    Lower body dressing activity did not occur: Safety/medical concerns What is the patient wearing?: Pants, Incontinence brief  Lower body assist Assist for lower body dressing: Minimal Assistance - Patient > 75%     Toileting Toileting    Toileting assist Assist for toileting: Minimal Assistance - Patient > 75%     Transfers Chair/bed transfer  Transfers assist  Chair/bed transfer activity did not occur: Safety/medical concerns  Chair/bed transfer assist level: Minimal Assistance - Patient > 75% Chair/bed transfer assistive device: Arboriculturist   Ambulation assist   Ambulation activity did not occur: Safety/medical concerns  Assist level: Contact Guard/Touching assist Assistive device: Walker-rolling Max distance: 138ft   Walk 10 feet activity   Assist     Assist level: 2 helpers Assistive device: Hand held assist   Walk 50 feet activity   Assist Walk 50 feet with 2 turns activity did not occur: Safety/medical concerns         Walk 150 feet activity   Assist Walk 150 feet activity did not occur: Safety/medical concerns         Walk 10 feet on uneven surface  activity   Assist Walk 10 feet on uneven surfaces activity did not occur: Safety/medical concerns         Wheelchair     Assist Is the patient using a wheelchair?: Yes Type of Wheelchair: Manual    Wheelchair assist level: Dependent - Patient 0% Max wheelchair distance: 237ft    Wheelchair 50 feet with 2 turns activity    Assist        Assist Level: Dependent - Patient 0%   Wheelchair 150 feet activity     Assist      Assist Level: Dependent - Patient 0%   Blood pressure (!) 158/69, pulse 65, temperature 97.9 F (36.6 C), temperature source Oral, resp. rate 16, height 6' 0.01" (1.829 m), weight 92.1 kg, SpO2 95 %.  Medical Problem List and Plan: 1. Functional deficits secondary to multifocal acute/early subacute cortical ischemia in the anterior right MCA territory, likely large vessel source from critical right ICA stenosis.  Status post right ICA stenting.  Left caudate small infarct>> procedure related versus occult atrial fibrillation.             -patient may shower             -ELOS/Goals: 10-15 days, supervision to min assist with PT, OT, and SLP  Continue CIR- PT, OT and SLP 2.  Antithrombotics: -DVT/anticoagulation:  Pharmaceutical: Lovenox             -antiplatelet therapy: Aspirin, Brilinta 3. Pain Management: Tylenol Left hip pain , exam c/w L troch bursitis , improved     4. Mood: LCSW to evaluate and provide emotional support             -antipsychotic agents: N/A 5. Neuropsych: This patient is capable of making decisions on his own behalf. 6. Skin/Wound Care: Routine skin care checks 7. Fluids/Electrolytes/Nutrition: Routine ins and outs and follow up chemistries 8.  Bibasilar PNA- afebrile , leukocytosis resolved has persistent Xray abnormalities              -- On Unasyn  7 d course planned - recheck CXR, plan to d/c unasyn but given Xray findings start augmentin for an additional week              --pt states cough has improved "100%"  afebrile  1/16- continued small cough- WBC down to 8.4k- con't regimen 9.  Frequent PVCs: Sees Dr. Stanford Breed as an outpatient  --recommends loop  recorder>> placed 1/10 10: Hypertension: monitor off Benicar  Vitals:   03/16/21 2026 03/17/21 0455  BP: (!) 187/84 (!) 158/69  Pulse: 79 65  Resp: 17 16  Temp: 97.8 F (36.6 C) 97.9 F (36.6 C)  SpO2: 95% 95%  Increase avapro to 75mg  daily on 1/18- monitor   11: Hyperlipidemia: Zetia and Lipitor 12: EtOH use: advised to drink no more than 1 alcoholic beverage dialy 13: Overweight: BMI = 28.5. Rec: weight loss, diet and exercise as appropriate 14: CKD stage 3a: serum Cr = 0.99. Monitor Poor intake , dislike dietary restrictions , will do 230ml fluid bolus today , recheck BMET   1/16- hates nectar thick liquids- encouraged pt to realize that need to prevent another pneumonia- con't diet restrictions 16: Hypokalemia: improved after oral repletion repeat next week  17: Anemia: asymptomatic, mild stable  CBC Latest Ref Rng & Units 03/12/2021 03/08/2021 03/08/2021  WBC 4.0 - 10.5 K/uL 8.4 16.3(H) 18.3(H)  Hemoglobin 13.0 - 17.0 g/dL 10.4(L) 10.6(L) 11.0(L)  Hematocrit 39.0 - 52.0 % 33.1(L) 32.4(L) 33.2(L)  Platelets 150 - 400 K/uL 384 264 260    18: Thyroid nodule: follow-up ultrasound in one year recommended 19. Dysphagia: continue D with nectars, aspiration precautions              -advance per SLP  1/16- diet per SLP, dicuss repeat MBS in team conf tomorrow   20.  Lethargy , improved, building endurance for therapy   LOS: 8 days A FACE TO Irvona E Addasyn Mcbreen 03/17/2021, 9:25 AM

## 2021-03-18 MED ORDER — BOOST / RESOURCE BREEZE PO LIQD CUSTOM
1.0000 | Freq: Three times a day (TID) | ORAL | Status: DC
  Administered 2021-03-18 (×3): 1 via ORAL

## 2021-03-18 NOTE — Progress Notes (Signed)
Physical Therapy Weekly Progress Note  Patient Details  Name: David [name] MRN: [id number] Date of Birth: 27-Apr-1940  Beginning of progress report period: March 10, 2021 End of progress report period: March 18, 2021  Today's Date: 03/18/2021 PT Individual Time: 0803-0901 PT Individual Time Calculation (min): 58 min   Patient has met 5 of 5 short term goals. David [name] is progressing well with therapy demonstrating increasing independence with functional mobility and improving endurance. His primary impairment continues to be his impaired cardiopulmonary endurance limiting his activity tolerance and gait distance in addition to B LE weakness (L>R) and impaired balance. He is performing supine<>sit supervision, sit<>stand and stand pivot transfers using RW with CGA, and has progressed his gait distance to 134f using RW with CGA. He remains on 2L of O2 via nasal cannula throughout mobility tasks in order to maintain SpO2 >90%. He will benefit from continued CIR level therapies to further address these impairments and increase his independence with functional mobility prior to D/Cing home with 24hr support from family.  Patient continues to demonstrate the following deficits muscle weakness and muscle joint tightness, decreased cardiorespiratoy endurance and decreased oxygen support, impaired timing and sequencing and unbalanced muscle activation, decreased attention and delayed processing, and decreased standing balance, decreased postural control, and decreased balance strategies and therefore will continue to benefit from skilled PT intervention to increase functional independence with mobility.  Patient progressing toward long term goals..  Continue plan of care.  PT Short Term Goals Week 1:  PT Short Term Goal 1 (Week 1): Pt will perform supine<>sit with min assist PT Short Term Goal 1 - Progress (Week 1): Met PT Short Term Goal 2 (Week 1): Pt will perform sit<>stands using LRAD with  CGA PT Short Term Goal 2 - Progress (Week 1): Met PT Short Term Goal 3 (Week 1): Pt will perform stand pivot transfers using LRAD with CGA PT Short Term Goal 3 - Progress (Week 1): Met PT Short Term Goal 4 (Week 1): Pt will ambulate at least 785fusing LRAD with min assist of 1 PT Short Term Goal 4 - Progress (Week 1): Met PT Short Term Goal 5 (Week 1): Pt will navigate 4 stairs using HRs with min assist of 1 PT Short Term Goal 5 - Progress (Week 1): Met Week 2:  PT Short Term Goal 1 (Week 2): = to LTGs based on ELOS  Skilled Therapeutic Interventions/Progress Updates:  Ambulation/gait training;Community reintegration;DME/adaptive equipment instruction;Neuromuscular re-education;Psychosocial support;Stair training;UE/LE Strength taining/ROM;Wheelchair propulsion/positioning;Balance/vestibular training;Discharge planning;Functional electrical stimulation;Pain management;Skin care/wound management;Therapeutic Activities;UE/LE Coordination activities;Cognitive remediation/compensation;Disease management/prevention;Functional mobility training;Patient/family education;Splinting/orthotics;Therapeutic Exercise;Visual/perceptual remediation/compensation   Pt received supine in bed asleep and upon awakening pt agreeable to therapy session. Pt had not yet eaten breakfast. Pt received and maintained on 1.5-2L of O2 via nasal cannula throughout session - SpO2 90% upon sitting EOB and maintained that or greater during session. Supine>sitting R EOB, HOB partially elevated but not using bedrail, with supervision and increased time/effort. MD in/out for morning assessment - discussed pt's poor intake. Sitting EOB with supervision for trunk control, no instability noted, while therapist donned TEDs, threaded on pants, and donned tennis shoes total assist for time management. Pt reports need to use bathroom. Sit>stand EOB>RW with CGA and pt able to manage LB clothing with increased time and encouragement for L UE use -  continent of bladder in urinal. Returned to sitting EOB targeting prolonged trunk control in unsupported position while eating breakfast - therapist reviewed all of pt's swallowing precautions per  SLP signs in pt's room and pt implemented them throughout with no signs of aspiration. Gait training ~64f using RW with CGA for safety - continues to demo slower gait speed, though improving, with decreased B LE foot clearance and maintains thoracic kyphosis with downward gaze - cuing throughout for improvement. Pt agreeable to remain sitting up in w/c - left with needs in reach, seat belt alarm on, and on wall 1.5-2L of O2.   Therapy Documentation Precautions:  Precautions Precautions: Fall, Other (comment) Precaution Comments: L hemiparesis Restrictions Weight Bearing Restrictions: No   Pain:  Denies pain during session.   Therapy/Group: Individual Therapy  CTawana Scale, PT, DPT, NCS, CSRS  03/18/2021, 7:45 AM

## 2021-03-18 NOTE — Progress Notes (Signed)
Occupational Therapy Session Note  Patient Details  Name: David [name] MRN: [id number] Date of Birth: 1940-11-02  Today's Date: 03/18/2021 OT Individual Time: 2122-4825 OT Individual Time Calculation (min): 28 min    Short Term Goals: Week 1:  OT Short Term Goal 1 (Week 1): Pt will complete UB dressing at supervision level sitting unsupported. OT Short Term Goal 1 - Progress (Week 1): Not met OT Short Term Goal 2 (Week 1): Pt will complete LB dressing with min assist sit to stand for all aspects. OT Short Term Goal 2 - Progress (Week 1): Not met OT Short Term Goal 3 (Week 1): Pt will complete toilet transfers with min assist using the RW and 3:1 OT Short Term Goal 3 - Progress (Week 1): Met OT Short Term Goal 4 (Week 1): Pt will complete two grooming tasks in standing at the sink with min guard assist for at least 3 mins. OT Short Term Goal 4 - Progress (Week 1): Not met Week 2:  OT Short Term Goal 1 (Week 2): Pt will complete UB dressing at supervision level sitting unsupported. OT Short Term Goal 2 (Week 2): Pt will complete LB dressing with min assist sit to stand for all aspects. OT Short Term Goal 3 (Week 2): Pt will complete two grooming tasks in standing at the sink with min guard assist for at least 3 mins.  Skilled Therapeutic Interventions/Progress Updates:    Pt received semi-reclined in bed using urinal with wife present, no c/o pain, agreeable to therapy. Session focus on self-care retraining, activity tolerance, LUE NMR/FMC, education in prep for improved ADL/IADL/func mobility performance + decreased caregiver burden. Set-up for use of urinal and pericare. Mod A to pull underwear and pants over hips while bridging in bed.   Came to sitting EOB with close S. SatO2 at 92% on 2L via [place], recovered to 96% within 1 min with PLB. Issued soft tan theraputty and pt participated in various therex with LUE including: pincer grasp and pull, key pinch, digit flexion, and rolling into  ball/log shape. Did not engage 4 digit to avoid triggering. Pt rates his [name] at 40% compared to 100% before the stroke.   Discussed [name] and activity modification (e.g., games with his great grandchildren) to promote LUE use at home. Additionally, discussed [name] acronym and importance of self-monitoring for s/sx CVA.   Pt left semi-reclined in bed with wife present and with bed alarm engaged, call bell in reach, and all immediate needs met.    Therapy Documentation Precautions:  Precautions Precautions: Fall, Other (comment) Precaution Comments: L hemiparesis Restrictions Weight Bearing Restrictions: No Pain: no c/o   ADL: See Care Tool for more details.  Therapy/Group: Individual Therapy  [name] MS, OTR/L  03/18/2021, 6:58 AM

## 2021-03-18 NOTE — Progress Notes (Signed)
Occupational Therapy Session Note  Patient Details  Name: David [name] MRN: [id number] Date of Birth: 1940-03-24  Today's Date: 03/18/2021 OT Individual Time: 1002-1100 OT Individual Time Calculation (min): 58 min    Short Term Goals: Week 2:  OT Short Term Goal 1 (Week 2): Pt will complete UB dressing at supervision level sitting unsupported. OT Short Term Goal 2 (Week 2): Pt will complete LB dressing with min assist sit to stand for all aspects. OT Short Term Goal 3 (Week 2): Pt will complete two grooming tasks in standing at the sink with min guard assist for at least 3 mins.  Skilled Therapeutic Interventions/Progress Updates:    Pt worked on Corporate treasurer level during session.  Oxygen sats were 93% on 2Ls nasal cannula to start.  Removed O2 and ambulated to the shower with min guard with the RW.  Oxygen sats 92% on room air after sitting.  He was able to remove all UB clothing with supervision and increased time.  Min assist for removal of LB clothing.  He was able to complete UB bathing with supervision and LB bathing sit to stand with use of the grab bars with min guard assist.  He was then able to complete transfer to the wheelchair at min guard assist with use of the RW.  O2 decreased down to 86% after standing to pull up underpants and pants at min assist level on room air.  Oxygen was placed back on at 2Ls with increase above 92%.  He was able to complete UB dressing with supervision.  Therapist provided total assist for donning TEDs with mod assist for donning shoes and tying them.  Pt still exhibits decreased FM coordination in the left hand for tying at this time.  Finished session with pt sitting in the wheelchair with family present and with the call button in reach.   Therapy Documentation Precautions:  Precautions Precautions: Fall, Other (comment) Precaution Comments: L hemiparesis Restrictions Weight Bearing Restrictions: No  Pain: Pain Assessment Pain  Scale: Faces Pain Score: 0-No pain Faces Pain Scale: No hurt ADL: See Care Tool Section for some details of mobility and selfcare   Therapy/Group: Individual Therapy  Maddyson Keil OTR/L 03/18/2021, 12:33 PM

## 2021-03-18 NOTE — Progress Notes (Addendum)
PROGRESS NOTE   Subjective/Complaints:  No issues overnite   ROS-  Pt denies SOB, abd pain, CP, N/V/C/D, and vision changes   Objective:   DG CHEST PORT 1 VIEW  Result Date: 03/16/2021 CLINICAL DATA:  Cough EXAM: PORTABLE CHEST 1 VIEW COMPARISON:  03/07/2021 FINDINGS: Persistent opacities at the lung bases. Additional patchy density elsewhere. No significant pleural effusion. No pneumothorax. Similar cardiomediastinal contours. Hiatal hernia. IMPRESSION: Bilateral opacities primarily at the lung bases remain present suspicious for pneumonia. Electronically Signed   By: Macy Mis M.D.   On: 03/16/2021 09:12   No results for input(s): WBC, HGB, HCT, PLT in the last 72 hours. No results for input(s): NA, K, CL, CO2, GLUCOSE, BUN, CREATININE, CALCIUM in the last 72 hours.   Intake/Output Summary (Last 24 hours) at 03/18/2021 0811 Last data filed at 03/18/2021 0210 Gross per 24 hour  Intake 360 ml  Output 1100 ml  Net -740 ml         Physical Exam: Vital Signs Blood pressure (!) 150/73, pulse 67, temperature 97.6 F (36.4 C), temperature source Oral, resp. rate 18, height 6' 0.01" (1.829 m), weight 92.1 kg, SpO2 94 %.   General: No acute distress Mood and affect are appropriate Heart: Regular rate and rhythm no rubs murmurs or extra sounds Lungs: Clear to auscultation, breathing unlabored, no rales or wheezes Abdomen: Positive bowel sounds, soft nontender to palpation, nondistended Extremities: No clubbing, cyanosis, or edema Skin: No evidence of breakdown, no evidence of rash   Skin: No evidence of breakdown, no evidence of rash, loop site CDI, no hematoma palpated  Neurologic: Cranial nerves II through XII intact, motor strength is 4/5 in bilateral deltoid, bicep, tricep, grip, hip flexor, knee extensors, ankle dorsiflexor and plantar flexor  Musculoskeletal: Full range of motion in all 4 extremities. No joint  swelling, left hip tenderness over greater troch , no pain with hip ROM    Assessment/Plan: 1. Functional deficits which require 3+ hours per day of interdisciplinary therapy in a comprehensive inpatient rehab setting. Physiatrist is providing close team supervision and 24 hour management of active medical problems listed below. Physiatrist and rehab team continue to assess barriers to discharge/monitor patient progress toward functional and medical goals  Care Tool:  Bathing  Bathing activity did not occur: Safety/medical concerns Body parts bathed by patient: Right arm, Left arm, Chest, Abdomen, Front perineal area, Buttocks, Right upper leg, Left upper leg, Right lower leg, Left lower leg, Face   Body parts bathed by helper: Right arm, Left arm, Chest, Buttocks, Right upper leg, Left upper leg, Right lower leg, Left lower leg, Face     Bathing assist Assist Level: Contact Guard/Touching assist     Upper Body Dressing/Undressing Upper body dressing Upper body dressing/undressing activity did not occur (including orthotics): Safety/medical concerns What is the patient wearing?: Pull over shirt    Upper body assist Assist Level: Minimal Assistance - Patient > 75%    Lower Body Dressing/Undressing Lower body dressing    Lower body dressing activity did not occur: Safety/medical concerns What is the patient wearing?: Pants, Incontinence brief     Lower body assist Assist for lower body dressing:  Minimal Assistance - Patient > 75%     Toileting Toileting    Toileting assist Assist for toileting: Minimal Assistance - Patient > 75%     Transfers Chair/bed transfer  Transfers assist  Chair/bed transfer activity did not occur: Safety/medical concerns  Chair/bed transfer assist level: Minimal Assistance - Patient > 75% Chair/bed transfer assistive device: Programmer, multimedia   Ambulation assist   Ambulation activity did not occur: Safety/medical  concerns  Assist level: Contact Guard/Touching assist Assistive device: Walker-rolling Max distance: 196ft   Walk 10 feet activity   Assist     Assist level: 2 helpers Assistive device: Hand held assist   Walk 50 feet activity   Assist Walk 50 feet with 2 turns activity did not occur: Safety/medical concerns         Walk 150 feet activity   Assist Walk 150 feet activity did not occur: Safety/medical concerns         Walk 10 feet on uneven surface  activity   Assist Walk 10 feet on uneven surfaces activity did not occur: Safety/medical concerns         Wheelchair     Assist Is the patient using a wheelchair?: Yes Type of Wheelchair: Manual    Wheelchair assist level: Dependent - Patient 0% Max wheelchair distance: 229ft    Wheelchair 50 feet with 2 turns activity    Assist        Assist Level: Dependent - Patient 0%   Wheelchair 150 feet activity     Assist      Assist Level: Dependent - Patient 0%   Blood pressure (!) 150/73, pulse 67, temperature 97.6 F (36.4 C), temperature source Oral, resp. rate 18, height 6' 0.01" (1.829 m), weight 92.1 kg, SpO2 94 %.  Medical Problem List and Plan: 1. Functional deficits secondary to multifocal acute/early subacute cortical ischemia in the anterior right MCA territory, likely large vessel source from critical right ICA stenosis.  Status post right ICA stenting.  Left caudate small infarct>> procedure related versus occult atrial fibrillation.             -patient may shower             -ELOS/Goals: tent d/c 1/26 supervision to min assist with PT, OT, and SLP  Continue CIR- PT, OT and SLP 2.  Antithrombotics: -DVT/anticoagulation:  Pharmaceutical: Lovenox             -antiplatelet therapy: Aspirin, Brilinta 3. Pain Management: Tylenol Left hip pain , exam c/w L troch bursitis , improved    4. Mood: LCSW to evaluate and provide emotional support             -antipsychotic agents:  N/A 5. Neuropsych: This patient is capable of making decisions on his own behalf. 6. Skin/Wound Care: Routine skin care checks 7. Fluids/Electrolytes/Nutrition: Routine ins and outs and follow up chemistries 8.  Bibasilar PNA- afebrile , leukocytosis resolved has persistent Xray abnormalities              -- On Unasyn  7 d course planned - recheck CXR, plan to d/c unasyn but given Xray findings start augmentin for an additional week              --pt states cough has improved "100%"  afebrile  1/16- continued small cough- WBC down to 8.4k- con't regimen 9.  Frequent PVCs: Sees Dr. Stanford Breed as an outpatient  --recommends loop recorder>> placed 1/10 10: Hypertension: monitor off  Benicar  Vitals:   03/17/21 1921 03/18/21 0414  BP: (!) 144/65 (!) 150/73  Pulse: 84 67  Resp: 18 18  Temp: 97.7 F (36.5 C) 97.6 F (36.4 C)  SpO2: 98% 94%  Increase avapro to 75mg  daily on 1/18- monitor , some improvement will monitor for 1-2 d prior to any further med changes   11: Hyperlipidemia: Zetia and Lipitor 12: EtOH use: advised to drink no more than 1 alcoholic beverage dialy 13: Overweight: BMI = 28.5. Rec: weight loss, diet and exercise as appropriate 14: CKD stage 3a: serum Cr = 0.99. Monitor Poor intake , dislike dietary restrictions , will do 234ml fluid bolus today , recheck BMET   1/16- hates nectar thick liquids- encouraged pt to realize that need to prevent another pneumonia- con't diet restrictions 16: Hypokalemia: improved after oral repletion repeat next week  17: Anemia: asymptomatic, mild stable  CBC Latest Ref Rng & Units 03/12/2021 03/29/2021 03/08/2021  WBC 4.0 - 10.5 K/uL 8.4 16.3(H) 18.3(H)  Hemoglobin 13.0 - 17.0 g/dL 10.4(L) 10.6(L) 11.0(L)  Hematocrit 39.0 - 52.0 % 33.1(L) 32.4(L) 33.2(L)  Platelets 150 - 400 K/uL 384 264 260   Repeat CBC in am  18: Thyroid nodule: follow-up ultrasound in one year recommended 19. Dysphagia: continue D with nectars, aspiration precautions              -advance per SLP  Poor intake, pt dislikes modified diet, hopefully can upgrade soon per SLP Start Chocolate boost   20.  Lethargy , improved, building endurance for therapy   LOS: 9 days A FACE TO [place] E Kamaryn Grimley 03/18/2021, 8:11 AM

## 2021-03-18 NOTE — Progress Notes (Addendum)
Speech Language Pathology Daily Session Note  Patient Details  Name: David [name] MRN: [id number] Date of Birth: 1940/05/10  Today's Date: 03/18/2021 SLP Individual Time: 1450-1540 SLP Individual Time Calculation (min): 50 min  Short Term Goals: Week 2: SLP Short Term Goal 2 (Week 2): STG=LTG due to ELOS  Skilled Therapeutic Interventions: Skilled ST treatment focused on speech and swallowing goals. Patient greeted sleeping in supine position with spouse at bedside. HOB elevated and pt agreeable to oral care with set-up A prior to initiation of PO trials. Pt consumed 1-2 ice chips by spoon with min-to-mod A cues to implement chin tuck position. Pt unlikely to perform chin tuck without direct supervision and cueing per spouse. Continue with current diet. Continue PO trials with SLP. Addressed spouse's questions regarding oropharyngeal swallow function and thickened liquids.   Performed RMT assessment per MD approval. Obtained mean inspiratory and expiratory pressure (MIP/MEP). Pt more deficient with inspiration at this time, however may also benefit from expiratory muscle strength training once sufficient with inspiratory training. Initiated use of RMT with inspiratory trainer ad established resistance level of 19cm/H20. Patient successfully completed 15 repetitions (3 sets of 5) with min-to-mod A verbal cues with direct demonstration for effectiveness. Patient tolerated without symptoms such as SOB or lightheadedness but appeared fatigued by 3rd and final set. Pt self perceived effort as 7/10. Patient was left in bed with alarm activated and immediate needs within reach at end of session. Continue per current plan of care.      Pain Pain Assessment Pain Scale: 0-10 Pain Score: 0-No pain Faces Pain Scale: No hurt  Therapy/Group: Individual Therapy  Patty Sermons 03/18/2021, 4:26 PM

## 2021-03-19 LAB — CBC WITH DIFFERENTIAL/PLATELET
Abs Immature Granulocytes: 0.06 10*3/uL (ref 0.00–0.07)
Basophils Absolute: 0 10*3/uL (ref 0.0–0.1)
Basophils Relative: 1 %
Eosinophils Absolute: 0.2 10*3/uL (ref 0.0–0.5)
Eosinophils Relative: 3 %
HCT: 31.6 % — ABNORMAL LOW (ref 39.0–52.0)
Hemoglobin: 10.1 g/dL — ABNORMAL LOW (ref 13.0–17.0)
Immature Granulocytes: 1 %
Lymphocytes Relative: 18 %
Lymphs Abs: 1.4 10*3/uL (ref 0.7–4.0)
MCH: 28.6 pg (ref 26.0–34.0)
MCHC: 32 g/dL (ref 30.0–36.0)
MCV: 89.5 fL (ref 80.0–100.0)
Monocytes Absolute: 0.7 10*3/uL (ref 0.1–1.0)
Monocytes Relative: 9 %
Neutro Abs: 5.2 10*3/uL (ref 1.7–7.7)
Neutrophils Relative %: 68 %
Platelets: 314 10*3/uL (ref 150–400)
RBC: 3.53 MIL/uL — ABNORMAL LOW (ref 4.22–5.81)
RDW: 15 % (ref 11.5–15.5)
WBC: 7.5 10*3/uL (ref 4.0–10.5)
nRBC: 0 % (ref 0.0–0.2)

## 2021-03-19 LAB — BASIC METABOLIC PANEL
Anion gap: 6 (ref 5–15)
BUN: 11 mg/dL (ref 8–23)
CO2: 22 mmol/L (ref 22–32)
Calcium: 9.1 mg/dL (ref 8.9–10.3)
Chloride: 114 mmol/L — ABNORMAL HIGH (ref 98–111)
Creatinine, Ser: 0.89 mg/dL (ref 0.61–1.24)
GFR, Estimated: >60 mL/min (ref >60)
Glucose, Bld: 111 mg/dL — ABNORMAL HIGH (ref 70–99)
Potassium: 3.2 mmol/L — ABNORMAL LOW (ref 3.5–5.1)
Sodium: 142 mmol/L (ref 135–145)

## 2021-03-19 MED ORDER — ORAL CARE MOUTH RINSE
15.0000 mL | Freq: Two times a day (BID) | OROMUCOSAL | Status: DC
  Administered 2021-03-20 – 2021-03-28 (×16): 15 mL via OROMUCOSAL

## 2021-03-19 MED ORDER — POTASSIUM CHLORIDE CRYS ER 20 MEQ PO TBCR
20.0000 meq | EXTENDED_RELEASE_TABLET | Freq: Every day | ORAL | Status: DC
  Administered 2021-03-19 – 2021-03-23 (×5): 20 meq via ORAL
  Filled 2021-03-19 (×5): qty 1

## 2021-03-19 NOTE — Progress Notes (Signed)
Physical Therapy Session Note  Patient Details  Name: David [name] MRN: [id number] Date of Birth: March 07, 1940  Today's Date: 03/19/2021 PT Individual Time: 1100-1155 PT Individual Time Calculation (min): 55 min   Short Term Goals: Week 2:  PT Short Term Goal 1 (Week 2): = to LTGs based on ELOS  Skilled Therapeutic Interventions/Progress Updates:    Patient received supine in bed, NT and wife at bedside, agreeable to PT. He denies pain. Patient coming to sit edge of bed with supervision to don pants with MinA. PT donning TEDs totalA for time. Sneakers TotalA for time. Patient standing with CGA to finish donning pants and complete stand pivot transfer to wc. PT transporting patient in wc to therapy gym for time management and energy conservation. He ambulated 67ft with RW and CGA on 3L O2. O2 94% upon sitting 4/10 dyspnea. Patient ambulating additional 55ft with RW and CGA with 2L O2. O2 92% upon sitting, 7/10 dyspnea. Patient ambulating final 73ft with RW and CGA on 1L O2. O2 91% upon sitting, 4/10 dyspnea. Patient completed 3x2 steps with prolonged seated rest break between. 2L O2. Oxygen 92-95%. Patient returning to room in wc, seatbelt alarm on, call light within reach, wife at bedside.   Therapy Documentation Precautions:  Precautions Precautions: Fall, Other (comment) Precaution Comments: L hemiparesis Restrictions Weight Bearing Restrictions: No   Therapy/Group: Individual Therapy  Karoline Caldwell, PT, DPT, CBIS  03/19/2021, 7:47 AM

## 2021-03-19 NOTE — Progress Notes (Addendum)
PROGRESS NOTE   Subjective/Complaints:  No issues overnite   ROS-  Pt denies SOB, abd pain, CP, N/V/C/D, and vision changes   Objective:   No results found. Recent Labs    03/19/21 0530  WBC 7.5  HGB 10.1*  HCT 31.6*  PLT 314   Recent Labs    03/19/21 0530  NA 142  K 3.2*  CL 114*  CO2 22  GLUCOSE 111*  BUN 11  CREATININE 0.89  CALCIUM 9.1     Intake/Output Summary (Last 24 hours) at 03/19/2021 0850 Last data filed at 03/18/2021 1824 Gross per 24 hour  Intake 240 ml  Output 300 ml  Net -60 ml         Physical Exam: Vital Signs Blood pressure (!) 157/71, pulse (!) 59, temperature (!) 97.4 F (36.3 C), resp. rate 16, height 6' 0.01" (1.829 m), weight 92.1 kg, SpO2 93 %.   General: No acute distress Mood and affect are appropriate Heart: Regular rate and rhythm no rubs murmurs or extra sounds Lungs: Clear to auscultation, breathing unlabored, no rales or wheezes Abdomen: Positive bowel sounds, soft nontender to palpation, nondistended Extremities: No clubbing, cyanosis, or edema Skin: No evidence of breakdown, no evidence of rash   Skin: No evidence of breakdown, no evidence of rash, loop site CDI, no hematoma palpated  Neurologic: Cranial nerves II through XII intact, motor strength is 4/5 in bilateral deltoid, bicep, tricep, grip, hip flexor, knee extensors, ankle dorsiflexor and plantar flexor  Musculoskeletal: Full range of motion in all 4 extremities. No joint swelling, left hip tenderness over greater troch , no pain with hip ROM    Assessment/Plan: 1. Functional deficits which require 3+ hours per day of interdisciplinary therapy in a comprehensive inpatient rehab setting. Physiatrist is providing close team supervision and 24 hour management of active medical problems listed below. Physiatrist and rehab team continue to assess barriers to discharge/monitor patient progress toward  functional and medical goals  Care Tool:  Bathing  Bathing activity did not occur: Safety/medical concerns Body parts bathed by patient: Right arm, Left arm, Chest, Abdomen, Front perineal area, Buttocks, Right upper leg, Left upper leg, Right lower leg, Left lower leg, Face   Body parts bathed by helper: Right arm, Left arm, Chest, Buttocks, Right upper leg, Left upper leg, Right lower leg, Left lower leg, Face     Bathing assist Assist Level: Contact Guard/Touching assist     Upper Body Dressing/Undressing Upper body dressing Upper body dressing/undressing activity did not occur (including orthotics): Safety/medical concerns What is the patient wearing?: Pull over shirt    Upper body assist Assist Level: Supervision/Verbal cueing    Lower Body Dressing/Undressing Lower body dressing    Lower body dressing activity did not occur: Safety/medical concerns What is the patient wearing?: Pants, Incontinence brief     Lower body assist Assist for lower body dressing: Minimal Assistance - Patient > 75%     Toileting Toileting    Toileting assist Assist for toileting: Minimal Assistance - Patient > 75%     Transfers Chair/bed transfer  Transfers assist  Chair/bed transfer activity did not occur: Safety/medical concerns  Chair/bed transfer assist level:  Contact Guard/Touching assist Chair/bed transfer assistive device: Programmer, multimedia   Ambulation assist   Ambulation activity did not occur: Safety/medical concerns  Assist level: Contact Guard/Touching assist Assistive device: Walker-rolling Max distance: 154ft   Walk 10 feet activity   Assist     Assist level: 2 helpers Assistive device: Hand held assist   Walk 50 feet activity   Assist Walk 50 feet with 2 turns activity did not occur: Safety/medical concerns         Walk 150 feet activity   Assist Walk 150 feet activity did not occur: Safety/medical concerns         Walk 10  feet on uneven surface  activity   Assist Walk 10 feet on uneven surfaces activity did not occur: Safety/medical concerns         Wheelchair     Assist Is the patient using a wheelchair?: Yes Type of Wheelchair: Manual    Wheelchair assist level: Dependent - Patient 0% Max wheelchair distance: 215ft    Wheelchair 50 feet with 2 turns activity    Assist        Assist Level: Dependent - Patient 0%   Wheelchair 150 feet activity     Assist      Assist Level: Dependent - Patient 0%   Blood pressure (!) 157/71, pulse (!) 59, temperature (!) 97.4 F (36.3 C), resp. rate 16, height 6' 0.01" (1.829 m), weight 92.1 kg, SpO2 93 %.  Medical Problem List and Plan: 1. Functional deficits secondary to multifocal acute/early subacute cortical ischemia in the anterior right MCA territory, likely large vessel source from critical right ICA stenosis.  Status post right ICA stenting.  Left caudate small infarct>> procedure related versus occult atrial fibrillation.             -patient may shower             -ELOS/Goals: tent d/c 1/26 supervision to min assist with PT, OT, and SLP  Continue CIR- PT, OT and SLP 2.  Antithrombotics: -DVT/anticoagulation:  Pharmaceutical: Lovenox             -antiplatelet therapy: Aspirin, Brilinta 3. Pain Management: Tylenol Left hip pain , resolved    4. Mood: LCSW to evaluate and provide emotional support             -antipsychotic agents: N/A 5. Neuropsych: This patient is capable of making decisions on his own behalf. 6. Skin/Wound Care: Routine skin care checks 7. Fluids/Electrolytes/Nutrition: Routine ins and outs and follow up chemistries 8.  Bibasilar PNA- afebrile , leukocytosis resolved has persistent Xray abnormalities              -- On Unasyn  7 d course planned - recheck CXR, plan to d/c unasyn but given Xray Cont po augmentin for an additional week              --pt states cough has improved "100%"  afebrile  1/16-  continued small cough- WBC down to 8.4k- con't regimen 9.  Frequent PVCs: Sees Dr. Stanford Breed as an outpatient  --recommends loop recorder>> placed 1/10 10: Hypertension: monitor off Benicar  Vitals:   03/18/21 2048 03/19/21 0446  BP: (!) 144/73 (!) 157/71  Pulse: 75 (!) 59  Resp: 16 16  Temp: 98.2 F (36.8 C) (!) 97.4 F (36.3 C)  SpO2: 96% 93%  Increase avapro to 75mg  daily on 1/18- still elevated but some fluctuation will monitor prior to increase   11:  Hyperlipidemia: Zetia and Lipitor 12: EtOH use: advised to drink no more than 1 alcoholic beverage dialy 13: Overweight: BMI = 28.5. Rec: weight loss, diet and exercise as appropriate 14: CKD stage 3a: serum Cr = 0.99. Monitor Has needed IVF, but BUN creat now improved   1/16- hates nectar thick liquids- encouraged pt to realize that need to prevent another pneumonia- con't diet restrictions 16: Hypokalemia: will need ongoing supplementation  17: Anemia: asymptomatic, mild stable  CBC Latest Ref Rng & Units 03/19/2021 03/12/2021 03/29/2021  WBC 4.0 - 10.5 K/uL 7.5 8.4 16.3(H)  Hemoglobin 13.0 - 17.0 g/dL 10.1(L) 10.4(L) 10.6(L)  Hematocrit 39.0 - 52.0 % 31.6(L) 33.1(L) 32.4(L)  Platelets 150 - 400 K/uL 314 384 264  HGB remains stable  18: Thyroid nodule: follow-up ultrasound in one year recommended 19. Dysphagia: continue D with nectars, aspiration precautions             -advance per SLP  Poor intake, pt dislikes modified diet, hopefully can upgrade soon per SLP Start Chocolate boost   20.  Lethargy , improved, building endurance for therapy   LOS: 10 days A FACE TO [place] E Karime Scheuermann 03/19/2021, 8:50 AM

## 2021-03-19 NOTE — Progress Notes (Signed)
Patient ID: David [name], male   DOB: 1940/10/25, 81 y.o.   MRN: [id number] Met with the patient to review plans for discharge, medications, and plan of care. Reported will manage his medications at discharge. Encouraged increased protein and dietary modifications (still on D3 nectar thick liquids) Patient remains on supplemental oxygen and winded during meal. Continue to follow along to discharge to address educational needs and facilitate preparation for discharge. Margarito Liner

## 2021-03-19 NOTE — Progress Notes (Signed)
Occupational Therapy Session Note  Patient Details  Name: David [name] MRN: [id number] Date of Birth: 09-Aug-1940  Today's Date: 03/19/2021 OT Individual Time: 0962-8366 OT Individual Time Calculation (min): 53 min    Short Term Goals: Week 2:  OT Short Term Goal 1 (Week 2): Pt will complete UB dressing at supervision level sitting unsupported. OT Short Term Goal 2 (Week 2): Pt will complete LB dressing with min assist sit to stand for all aspects. OT Short Term Goal 3 (Week 2): Pt will complete two grooming tasks in standing at the sink with min guard assist for at least 3 mins.  Skilled Therapeutic Interventions/Progress Updates:    Pt in bed asleep to start but easily awakened and willing to participate.  He declined shower but was agreeable to completion of shaving in standing as well as brushing his teeth.  He was able to transfer to the EOB with supervision and donn his shoes on both feet with setup, but needed max assist for tying them.  Oxygen sats were 96% on 2Ls nasal cannula.  He was able to transfer over to the sink with min assist using the RW for support.  He then completed shaving at min assist level for thoroughness with removal of O2.  Sats were at 92% after standing for 2-3 mins without O2.  He stood again after a brief 2 minute rest to complete oral hygiene.  He was able to use the LUE for holding the toothpaste to open it with the right, but initially, he was unable to remove the lid with the LUE when he attempted it.  He then transferred to the wheelchair where he worked on LUE coordination the remainder of the session, opening his larger deodorant top with the LUE and increased time as well as folding a towel, and attempting to tie a shoe.  He was unable to tie the shoe at this time.  Encouraged continued work on [employer] coordination removing and replacing the top to the deodorant as well as attempting to open and close other items.  Oxygen was replaced at end of session as he  remained greater than 92% throughout the listed OT activities this session.  Will continue to monitor in therapies to see if it can be weaned.  Call button and phone in reach with safety alarm belt in place.    Therapy Documentation Precautions:  Precautions Precautions: Fall, Other (comment) Precaution Comments: L hemiparesis Restrictions Weight Bearing Restrictions: No  Pain: Pain Assessment Pain Scale: 0-10 Pain Score: 0-No pain ADL: See Care Tool Section for some details of mobility and selfcare   Therapy/Group: Individual Therapy  Joanny Dupree OTR/L 03/19/2021, 4:37 PM

## 2021-03-20 ENCOUNTER — Inpatient Hospital Stay (HOSPITAL_COMMUNITY): Payer: Medicare Other

## 2021-03-20 ENCOUNTER — Other Ambulatory Visit: Payer: Self-pay | Admitting: Physical Medicine and Rehabilitation

## 2021-03-20 LAB — URINALYSIS, ROUTINE W REFLEX MICROSCOPIC
Bilirubin Urine: NEGATIVE
Glucose, UA: NEGATIVE mg/dL
Hgb urine dipstick: NEGATIVE
Ketones, ur: NEGATIVE mg/dL
Leukocytes,Ua: NEGATIVE
Nitrite: NEGATIVE
Protein, ur: NEGATIVE mg/dL
Specific Gravity, Urine: 1.018 (ref 1.005–1.030)
pH: 6 (ref 5.0–8.0)

## 2021-03-20 LAB — CBC WITH DIFFERENTIAL/PLATELET
Abs Immature Granulocytes: 0.06 10*3/uL (ref 0.00–0.07)
Basophils Absolute: 0 10*3/uL (ref 0.0–0.1)
Basophils Relative: 0 %
Eosinophils Absolute: 0.3 10*3/uL (ref 0.0–0.5)
Eosinophils Relative: 2 %
HCT: 34.4 % — ABNORMAL LOW (ref 39.0–52.0)
Hemoglobin: 11.1 g/dL — ABNORMAL LOW (ref 13.0–17.0)
Immature Granulocytes: 1 %
Lymphocytes Relative: 11 %
Lymphs Abs: 1.2 10*3/uL (ref 0.7–4.0)
MCH: 29 pg (ref 26.0–34.0)
MCHC: 32.3 g/dL (ref 30.0–36.0)
MCV: 89.8 fL (ref 80.0–100.0)
Monocytes Absolute: 0.9 10*3/uL (ref 0.1–1.0)
Monocytes Relative: 8 %
Neutro Abs: 8 10*3/uL — ABNORMAL HIGH (ref 1.7–7.7)
Neutrophils Relative %: 78 %
Platelets: 359 10*3/uL (ref 150–400)
RBC: 3.83 MIL/uL — ABNORMAL LOW (ref 4.22–5.81)
RDW: 15.2 % (ref 11.5–15.5)
WBC: 10.3 10*3/uL (ref 4.0–10.5)
nRBC: 0 % (ref 0.0–0.2)

## 2021-03-20 LAB — BASIC METABOLIC PANEL
Anion gap: 7 (ref 5–15)
BUN: 10 mg/dL (ref 8–23)
CO2: 23 mmol/L (ref 22–32)
Calcium: 9.6 mg/dL (ref 8.9–10.3)
Chloride: 110 mmol/L (ref 98–111)
Creatinine, Ser: 0.86 mg/dL (ref 0.61–1.24)
GFR, Estimated: >60 mL/min (ref >60)
Glucose, Bld: 129 mg/dL — ABNORMAL HIGH (ref 70–99)
Potassium: 3.4 mmol/L — ABNORMAL LOW (ref 3.5–5.1)
Sodium: 140 mmol/L (ref 135–145)

## 2021-03-20 LAB — PROCALCITONIN: Procalcitonin: <0.1 ng/mL

## 2021-03-20 MED ORDER — POTASSIUM CHLORIDE 20 MEQ PO PACK
40.0000 meq | PACK | Freq: Once | ORAL | Status: AC
  Administered 2021-03-20: 40 meq via ORAL
  Filled 2021-03-20: qty 2

## 2021-03-20 MED ORDER — BOOST / RESOURCE BREEZE PO LIQD CUSTOM
1.0000 | Freq: Three times a day (TID) | ORAL | Status: DC
  Administered 2021-03-21 – 2021-03-23 (×3): 1 via ORAL

## 2021-03-20 MED ORDER — SODIUM CHLORIDE 0.9 % IV SOLN: INTRAVENOUS | Status: AC

## 2021-03-20 MED ORDER — SODIUM CHLORIDE 0.9 % IV SOLN: INTRAVENOUS | Status: DC

## 2021-03-20 MED ORDER — NAPHAZOLINE-GLYCERIN 0.012-0.25 % OP SOLN
1.0000 [drp] | Freq: Four times a day (QID) | OPHTHALMIC | Status: DC | PRN
  Administered 2021-03-20: 2 [drp] via OPHTHALMIC
  Administered 2021-03-21 – 2021-03-22 (×2): 1 [drp] via OPHTHALMIC
  Filled 2021-03-20: qty 15

## 2021-03-20 NOTE — Progress Notes (Addendum)
PROGRESS NOTE   Subjective/Complaints: Complains of eye irritation Requests a Kleenex BP elevated to 172/82 Respiratory rate up to 21  ROS-  Pt denies SOB, abd pain, CP, N/V/C/D, and vision changes, +eye irritation   Objective:   No results found. Recent Labs    03/19/21 0530  WBC 7.5  HGB 10.1*  HCT 31.6*  PLT 314   Recent Labs    03/19/21 0530  NA 142  K 3.2*  CL 114*  CO2 22  GLUCOSE 111*  BUN 11  CREATININE 0.89  CALCIUM 9.1     Intake/Output Summary (Last 24 hours) at 03/20/2021 1834 Last data filed at 03/20/2021 1445 Gross per 24 hour  Intake 250 ml  Output 575 ml  Net -325 ml        Physical Exam: Vital Signs Blood pressure (!) 172/82, pulse 75, temperature 98.4 F (36.9 C), resp. rate (!) 21, height 6' 0.01" (1.829 m), weight 92.1 kg, SpO2 97 %. Gen: no distress, normal appearing HEENT: oral mucosa pink and moist, eyes red and irritated Cardio: Reg rate Chest: normal effort, normal rate of breathing Abd: soft, non-distended Ext: no edema Psych: pleasant, normal affect  Skin: No evidence of breakdown, no evidence of rash, loop site CDI, no hematoma palpated  Neurologic: Cranial nerves II through XII intact, motor strength is 4/5 in bilateral deltoid, bicep, tricep, grip, hip flexor, knee extensors, ankle dorsiflexor and plantar flexor  Musculoskeletal: Full range of motion in all 4 extremities. No joint swelling, left hip tenderness over greater troch , no pain with hip ROM    Assessment/Plan: 1. Functional deficits which require 3+ hours per day of interdisciplinary therapy in a comprehensive inpatient rehab setting. Physiatrist is providing close team supervision and 24 hour management of active medical problems listed below. Physiatrist and rehab team continue to assess barriers to discharge/monitor patient progress toward functional and medical goals  Care Tool:  Bathing   Bathing activity did not occur: Safety/medical concerns Body parts bathed by patient: Right arm, Left arm, Chest, Abdomen, Front perineal area, Buttocks, Right upper leg, Left upper leg, Right lower leg, Left lower leg, Face   Body parts bathed by helper: Right arm, Left arm, Chest, Buttocks, Right upper leg, Left upper leg, Right lower leg, Left lower leg, Face     Bathing assist Assist Level: Contact Guard/Touching assist     Upper Body Dressing/Undressing Upper body dressing Upper body dressing/undressing activity did not occur (including orthotics): Safety/medical concerns What is the patient wearing?: Pull over shirt    Upper body assist Assist Level: Supervision/Verbal cueing    Lower Body Dressing/Undressing Lower body dressing    Lower body dressing activity did not occur: Safety/medical concerns What is the patient wearing?: Pants, Incontinence brief     Lower body assist Assist for lower body dressing: Minimal Assistance - Patient > 75%     Toileting Toileting    Toileting assist Assist for toileting: Minimal Assistance - Patient > 75%     Transfers Chair/bed transfer  Transfers assist  Chair/bed transfer activity did not occur: Safety/medical concerns  Chair/bed transfer assist level: Contact Guard/Touching assist Chair/bed transfer assistive device: Environmental consultant  Ambulation   Ambulation assist   Ambulation activity did not occur: Safety/medical concerns  Assist level: Contact Guard/Touching assist Assistive device: Walker-rolling Max distance: 1108ft   Walk 10 feet activity   Assist     Assist level: 2 helpers Assistive device: Hand held assist   Walk 50 feet activity   Assist Walk 50 feet with 2 turns activity did not occur: Safety/medical concerns         Walk 150 feet activity   Assist Walk 150 feet activity did not occur: Safety/medical concerns         Walk 10 feet on uneven surface  activity   Assist Walk 10  feet on uneven surfaces activity did not occur: Safety/medical concerns         Wheelchair     Assist Is the patient using a wheelchair?: Yes Type of Wheelchair: Manual    Wheelchair assist level: Dependent - Patient 0% Max wheelchair distance: 234ft    Wheelchair 50 feet with 2 turns activity    Assist        Assist Level: Dependent - Patient 0%   Wheelchair 150 feet activity     Assist      Assist Level: Dependent - Patient 0%   Blood pressure (!) 172/82, pulse 75, temperature 98.4 F (36.9 C), resp. rate (!) 21, height 6' 0.01" (1.829 m), weight 92.1 kg, SpO2 97 %.  Medical Problem List and Plan: 1. Functional deficits secondary to multifocal acute/early subacute cortical ischemia in the anterior right MCA territory, likely large vessel source from critical right ICA stenosis.  Status post right ICA stenting.  Left caudate small infarct>> procedure related versus occult atrial fibrillation.             -patient may shower             -ELOS/Goals: tent d/c 1/26 supervision to min assist with PT, OT, and SLP  Continue CIR- PT, OT and SLP 2.  Antithrombotics: -DVT/anticoagulation:  Pharmaceutical: Lovenox             -antiplatelet therapy: Aspirin, Brilinta 3. Pain Management: Tylenol Left hip pain , resolved    4. Mood: LCSW to evaluate and provide emotional support             -antipsychotic agents: N/A 5. Neuropsych: This patient is capable of making decisions on his own behalf. 6. Skin/Wound Care: Routine skin care checks 7. Fluids/Electrolytes/Nutrition: Routine ins and outs and follow up chemistries 8.  Bibasilar PNA- afebrile , leukocytosis resolved has persistent Xray abnormalities              -- On Unasyn  7 d course planned - recheck CXR, plan to d/c unasyn but given Xray Cont po augmentin for an additional week              --pt states cough has improved "100%"  afebrile  1/16- continued small cough- WBC down to 8.4k- con't regimen 9.   Frequent PVCs: Sees Dr. Stanford Breed as an outpatient  --recommends loop recorder>> placed 1/10 10: Hypertension: monitor off Benicar  Vitals:   03/20/21 0336 03/20/21 1331  BP: (!) 157/80 (!) 172/82  Pulse: 89 75  Resp: 19 (!) 21  Temp: 98.4 F (36.9 C) 98.4 F (36.9 C)  SpO2: 100% 97%  Increase avapro to 75mg  daily on 1/18- still elevated but some fluctuation will monitor prior to increase   11: Hyperlipidemia: Zetia and Lipitor 12: EtOH use: advised to drink no more than  1 alcoholic beverage dialy 13: Overweight: BMI = 28.5. Rec: weight loss, diet and exercise as appropriate 14: CKD stage 3a: serum Cr = 0.99. Monitor Has needed IVF, but BUN creat now improved   1/16- hates nectar thick liquids- encouraged pt to realize that need to prevent another pneumonia- con't diet restrictions 16: Hypokalemia: will need ongoing supplementation. Additional 1meq added 1/21.  17: Anemia: asymptomatic, mild stable  CBC Latest Ref Rng & Units 03/19/2021 03/12/2021 03/16/2021  WBC 4.0 - 10.5 K/uL 7.5 8.4 16.3(H)  Hemoglobin 13.0 - 17.0 g/dL 10.1(L) 10.4(L) 10.6(L)  Hematocrit 39.0 - 52.0 % 31.6(L) 33.1(L) 32.4(L)  Platelets 150 - 400 K/uL 314 384 264  HGB remains stable  18: Thyroid nodule: follow-up ultrasound in one year recommended 19. Dysphagia: continue D with nectars, aspiration precautions             -advance per SLP  Poor intake, pt dislikes modified diet, hopefully can upgrade soon per SLP Start Chocolate boost   20.  Lethargy , improved, building endurance for therapy, medications reviewed for sedating medications and none noted.   21. Eye irritation: eye drops added.  22. Fever/feeling hot/signs of congestion: blood cultures and urine cultures, CXR, labs ordered and no signs of new/worsening infection noted.  23. Transaminitis: Tylenol d/ced, repeat CMP tomorrow   LOS: 11 days A FACE TO FACE EVALUATION WAS PERFORMED  Izora Ribas 03/20/2021, 6:34 PM

## 2021-03-20 NOTE — Progress Notes (Addendum)
Speech Language Pathology Daily Session Note  Patient Details  Name: David [name] MRN: [id number] Date of Birth: [birth year]  Today's Date: 03/20/2021 SLP Individual Time: 1345-1415 SLP Individual Time Calculation (min): 30 min  Short Term Goals: Week 2: SLP Short Term Goal 2 (Week 2): STG=LTG due to ELOS  Skilled Therapeutic Interventions: Skilled ST treatment focused on swallowing and cognitive goals. Pt performed oral care with set-up A prior to therapeutic PO trials. Pt consumed single ice chips with no overt s/s of aspiration. Pt also consumed thin liquids (water) by tsp and small single cup sips with mildly delayed AP transit, suspect delayed swallow initiation, and sup A cues to consistently perform chin tuck and secondary swallow. Pt with immediate cough x1 and occasional delayed throat clearing. Pt also assessed with consumption of thin liquid cup sips with neutral head positioning without overt s/sx of aspiration. Increased transit time needed with chin down position as compared to neutral position. Recommend repeat MBS next week to further assess oropharyngeal swallow function and to determine need/effectiveness of compensatory swallowing strategies and positioning. Recommend continuation of current diet at this time. Spoke with patient about trial for whole meds in puree vs. crushed. Pt in agreement. RN was notified and reported plans to pass along in report to trial with evening medications. Patient was left in bed with alarm activated and immediate needs within reach at end of session. RN at bedside. Continue per current plan of care.      Pain Pain Assessment Pain Scale: 0-10 Pain Score: 0-No pain  Therapy/Group: Individual Therapy  Patty Sermons 03/20/2021, 2:27 PM

## 2021-03-20 NOTE — Progress Notes (Signed)
Family Stated that patient is having increased cough and fatigue. Pt also stated he is feeling "hot". Vitals assessed, pt with low grade temp, 99.2. Dr. Ranell Patrick made aware of family and patient concerns. New orders obtained. Labs pending. PRN tylenol given for comfort. Continue plan of care.   Gerald Stabs, RN

## 2021-03-21 LAB — COMPREHENSIVE METABOLIC PANEL
ALT: 50 U/L — ABNORMAL HIGH (ref 0–44)
AST: 31 U/L (ref 15–41)
Albumin: 2.2 g/dL — ABNORMAL LOW (ref 3.5–5.0)
Alkaline Phosphatase: 132 U/L — ABNORMAL HIGH (ref 38–126)
Anion gap: 9 (ref 5–15)
BUN: 11 mg/dL (ref 8–23)
CO2: 21 mmol/L — ABNORMAL LOW (ref 22–32)
Calcium: 9.3 mg/dL (ref 8.9–10.3)
Chloride: 111 mmol/L (ref 98–111)
Creatinine, Ser: 0.89 mg/dL (ref 0.61–1.24)
GFR, Estimated: >60 mL/min (ref >60)
Glucose, Bld: 100 mg/dL — ABNORMAL HIGH (ref 70–99)
Potassium: 3.8 mmol/L (ref 3.5–5.1)
Sodium: 141 mmol/L (ref 135–145)
Total Bilirubin: 0.6 mg/dL (ref 0.3–1.2)
Total Protein: 5.9 g/dL — ABNORMAL LOW (ref 6.5–8.1)

## 2021-03-21 MED ORDER — PHENAZOPYRIDINE HCL 100 MG PO TABS
100.0000 mg | ORAL_TABLET | Freq: Three times a day (TID) | ORAL | Status: AC
  Administered 2021-03-21 – 2021-03-25 (×8): 100 mg via ORAL
  Filled 2021-03-21 (×12): qty 1

## 2021-03-21 NOTE — Progress Notes (Addendum)
Speech Language Pathology Daily Session Note  Patient Details  Name: David [name] MRN: [id number] Date of Birth: 1940/03/31  Today's Date: 03/21/2021 SLP Individual Time: 5110-2111 SLP Individual Time Calculation (min): 45 min  Short Term Goals: Week 2: SLP Short Term Goal 2 (Week 2): STG=LTG due to ELOS  Skilled Therapeutic Interventions:  Pt was seen for skilled ST targeting speech intelligibility goals.  Pt was repositioned to sitting upright in bed to maximize safety with PO intake during medication administration and efficacy of RMT exercises.  No overt s/s of aspiration with meds crushed in pureed textures.  Pt reports feeling better in comparison to yesterday with no complaints of coughing or feeling febrile.  Chest x ray findings from yesterday are as follows: "Interval improvement in multifocal pulmonary infiltrates, likely infectious or inflammatory."  Pt completed 25 repetitions in 5 sets of 5 with his inspiratory muscle trainer with intermittent rest breaks and a self reported effort level of 5 out of 10.  Pt was fully intelligible at the conversational level in a mildly distracting environment with mod I.  Pt requested to use the urinal and SLP assisted in placement and donning of dry incontinence brief after using urinal.  Pt left in the care of nursing at the end of today's session.  Continue per current plan of care.    Pain Pain Assessment Pain Scale: Faces Pain Score: 0-No pain Faces Pain Scale: Hurts a little bit Pain Type: Acute pain Pain Location: Head Pain Descriptors / Indicators: Headache Pain Intervention(s): Other (Comment) (declined asking for medication)  Therapy/Group: Individual Therapy  Malyah Ohlrich, Selinda Orion 03/21/2021, 10:12 AM

## 2021-03-21 NOTE — Progress Notes (Signed)
Physical Therapy Session Note  Patient Details  Name: David [name] MRN: [id number] Date of Birth: April 08, 1940  Today's Date: 03/21/2021 PT Individual Time: 1300-1405 PT Individual Time Calculation (min): 65 min   Short Term Goals:  Week 2:  PT Short Term Goal 1 (Week 2): = to LTGs based on ELOS     Skilled Therapeutic Interventions/Progress Updates:  Pt resting in bed with HOB raised to 65 degrees. Pt on 2 L O2 via [place]; O2 sats 93%, HR 72 at rest.  Pt on continuous fluids IV.  Wife present.  Pt reported pain bil ears 5/10 , due to O2 tubing.  Skin behind ears red with areas of pressure noted.  PT placed small foam sleeves over O2 tubing, and pt stated that his ears felt better immediately. PT informed Danae Chen, LPN.   Pt with congested sounding cough, no production.  PT urged him to use incentive inspirometer q hour when awake. Pt performed x 10 reps.  Wife reported that pt has always been a mouth breather.  neuromuscular re-education via demo and multimodal cues for supine with head elevated 65 degrees: 15 x 1 bil hip internal rotation, R/L short arc quad knee extensions; 30 x 1 bil ankle pumps.  PT donned TEDS with pt in supine.  Supine> sit with HOB raised 45 degrees, supervision.  Pt scooted forward on bed with VCs.  He donned L shoe with mod assist and R shoe with min assist.  Sit> stand iwht CGA to RW.  Gait training x 90' with CG/close supervision, and cues for upright trunk,  forward gaze and longer steps lengths.  Pt unable to correct gait deviations perceptibly.  Atter seated rest, gait as above x 140' with RW.  O2 sats 94% and HR 101 after gait training.  At end of session, pt resting in bed with HOB raised, bed alarm set and needs at hand.  Wife present.     Therapy Documentation Precautions:  Precautions Precautions: Fall, Other (comment) Precaution Comments: L hemiparesis Restrictions Weight Bearing Restrictions: No    Vital Signs:  O2 Device: Nasal Cannula O2  Flow Rate (L/min): 2 L/min      Therapy/Group: Individual Therapy  Dalton Mille 03/21/2021, 4:44 PM

## 2021-03-21 NOTE — Progress Notes (Signed)
Patient complained of burning while urinating. Patient stated all day he was fine and it just started. Notified Dr. Ranell Patrick. New orders placed.

## 2021-03-22 LAB — URINE CULTURE: Culture: <10000 — AB

## 2021-03-22 MED ORDER — MEGESTROL ACETATE 400 MG/10ML PO SUSP
400.0000 mg | Freq: Two times a day (BID) | ORAL | Status: DC
  Administered 2021-03-22 – 2021-03-27 (×8): 400 mg via ORAL
  Filled 2021-03-22 (×8): qty 10

## 2021-03-22 MED ORDER — TRAMADOL HCL 50 MG PO TABS
50.0000 mg | ORAL_TABLET | Freq: Two times a day (BID) | ORAL | Status: DC | PRN
  Administered 2021-03-22 – 2021-03-28 (×2): 50 mg via ORAL
  Filled 2021-03-22 (×2): qty 1

## 2021-03-22 MED ORDER — IRBESARTAN 75 MG PO TABS
150.0000 mg | ORAL_TABLET | Freq: Every day | ORAL | Status: DC
  Administered 2021-03-22 – 2021-03-25 (×3): 150 mg via ORAL
  Filled 2021-03-22 (×3): qty 2

## 2021-03-22 NOTE — Discharge Instructions (Signed)
STROKE/TIA DISCHARGE INSTRUCTIONS SMOKING Cigarette smoking nearly doubles your risk of having a stroke & is the single most alterable risk factor  If you smoke or have smoked in the last 12 months, you are advised to quit smoking for your health. Most of the excess cardiovascular risk related to smoking disappears within a year of stopping. Ask you doctor about anti-smoking medications David [name] Quit Line: 1-800-QUIT NOW Free Smoking Cessation Classes [phone]  CHOLESTEROL Know your levels; limit fat & cholesterol in your diet  Lipid Panel     Component Value Date/Time   CHOL 77 03/06/2021 0515   CHOL 172 11/18/2020 0843   TRIG 78 03/06/2021 0515   HDL 15 (L) 03/06/2021 0515   HDL 48 11/18/2020 0843   CHOLHDL 5.1 03/06/2021 0515   VLDL 16 03/06/2021 0515   LDLCALC 46 03/06/2021 0515   LDLCALC 106 (H) 11/18/2020 0843     Many patients benefit from treatment even if their cholesterol is at goal. Goal: Total Cholesterol (CHOL) less than 160 Goal:  Triglycerides (TRIG) less than 150 Goal:  HDL greater than 40 Goal:  LDL (LDLCALC) less than 100   BLOOD PRESSURE American Stroke Association blood pressure target is less that 120/80 mm/Hg  Your discharge blood pressure is:  BP: (!) 170/84 Monitor your blood pressure Limit your salt and alcohol intake Many individuals will require more than one medication for high blood pressure  DIABETES (A1c is a blood sugar average for last 3 months) Goal HGBA1c is under 7% (HBGA1c is blood sugar average for last 3 months)  Diabetes: Diagnosis of diabetes:  Your A1c:6.3 %    Lab Results  Component Value Date   HGBA1C 6.3 (H) 03/06/2021    Your HGBA1c can be lowered with medications, healthy diet, and exercise. Check your blood sugar as directed by your physician Call your physician if you experience unexplained or low blood sugars.  PHYSICAL ACTIVITY/REHABILITATION Goal is 30 minutes at least 4 days per week  Activity: Increase activity  slowly, Therapies: Physical Therapy: Outpatient Return to work: n/a Activity decreases your risk of heart attack and stroke and makes your heart stronger.  It helps control your weight and blood pressure; helps you relax and can improve your mood. Participate in a regular exercise program. Talk with your doctor about the best form of exercise for you (dancing, walking, swimming, cycling).  DIET/WEIGHT Goal is to maintain a healthy weight  Your discharge diet is:  Diet Order             DIET DYS 3 Room service appropriate? Yes; Fluid consistency: Nectar Thick  Diet effective now                  thin liquids Your height is:  Height: 6' 0.01" (182.9 cm) Your current weight is: Weight: 92.1 kg Your Body Mass Index (BMI) is:  BMI (Calculated): 27.53 Following the type of diet specifically designed for you will help prevent another stroke. Your goal weight range is:   Your goal Body Mass Index (BMI) is 19-24. Healthy food habits can help reduce 3 risk factors for stroke:  High cholesterol, hypertension, and excess weight.  RESOURCES Stroke/Support Group:  Call [phone]   STROKE EDUCATION PROVIDED/REVIEWED AND GIVEN TO PATIENT Stroke warning signs and symptoms How to activate emergency medical system (call 911). Medications prescribed at discharge. Need for follow-up after discharge. Personal risk factors for stroke. Pneumonia vaccine given: No Flu vaccine given: No My questions have been answered, the  writing is legible, and I understand these instructions.  I will adhere to these goals & educational materials that have been provided to me after my discharge from the hospital.    Inpatient Rehab Discharge Instructions  David [name] Discharge date and time:   Activities/Precautions/ Functional Status: Activity: no lifting, driving, or strenuous exercise for until cleared by MD Diet: diabetic diet Wound Care: none needed Functional status:  ___ No restrictions     ___  Walk up steps independently ___ 24/7 supervision/assistance   ___ Walk up steps with assistance ___ Intermittent supervision/assistance  ___ Bathe/dress independently ___ Walk with walker     __x_ Bathe/dress with assistance ___ Walk Independently    ___ Shower independently ___ Walk with assistance    ___ Shower with assistance __x_ No alcohol     ___ Return to work/school ________  Special Instructions:  No driving, alcohol consumption or tobacco use.    My questions have been answered and I understand these instructions. I will adhere to these goals and the provided educational materials after my discharge from the hospital.  Patient/Caregiver Signature _______________________________ Date __________  Clinician Signature _______________________________________ Date __________  Please bring this form and your medication list with you to all your follow-up doctor's appointments.

## 2021-03-22 NOTE — Progress Notes (Signed)
Patient ID: David [name], male   DOB: 03-14-1940, 81 y.o.   MRN: [id number]  [place] and Wheelchair ordered through [employer].

## 2021-03-22 NOTE — Progress Notes (Signed)
PROGRESS NOTE   Subjective/Complaints:  Still coughing , no CP, no change in clear sputum , no resp distress, discussed trial of O2 wean with nursing  Also with left shoulder pain ,  no recent fall   ROS-  Pt denies SOB, abd pain, CP, N/V/C/D, and vision changes, +eye irritation   Objective:   DG Chest Port 1 View  Result Date: 03/20/2021 CLINICAL DATA:  Cough EXAM: PORTABLE CHEST 1 VIEW COMPARISON:  03/16/2021 FINDINGS: Multifocal pulmonary infiltrates are again identified and appear improved, particularly at the left lung base, likely infectious or inflammatory in the acute setting. No pneumothorax or pleural effusion. Moderate hiatal hernia again noted. Cardiac size is within normal limits. Implanted loop recorder again noted. Pulmonary vascularity is normal. IMPRESSION: Interval improvement in multifocal pulmonary infiltrates, likely infectious or inflammatory. Moderate hiatal hernia. Electronically Signed   By: Fidela Salisbury M.D.   On: 03/20/2021 20:51   Recent Labs    03/20/21 1916  WBC 10.3  HGB 11.1*  HCT 34.4*  PLT 359    Recent Labs    03/20/21 1916 03/21/21 0646  NA 140 141  K 3.4* 3.8  CL 110 111  CO2 23 21*  GLUCOSE 129* 100*  BUN 10 11  CREATININE 0.86 0.89  CALCIUM 9.6 9.3      Intake/Output Summary (Last 24 hours) at 03/22/2021 0848 Last data filed at 03/22/2021 0815 Gross per 24 hour  Intake 200 ml  Output 700 ml  Net -500 ml         Physical Exam: Vital Signs Blood pressure (!) 170/84, pulse 63, temperature (!) 97.4 F (36.3 C), temperature source Oral, resp. rate 18, height 6' 0.01" (1.829 m), weight 92.1 kg, SpO2 95 %.  General: No acute distress Mood and affect are appropriate Heart: Regular rate and rhythm no rubs murmurs or extra sounds Lungs: Clear to auscultation, breathing unlabored, no rales or wheezes Abdomen: Positive bowel sounds, soft nontender to palpation,  nondistended Extremities: No clubbing, cyanosis, or edema  Skin: No evidence of breakdown, no evidence of rash, loop site CDI, no hematoma palpated  Neurologic: Cranial nerves II through XII intact, motor strength is 4/5 in bilateral deltoid, bicep, tricep, grip, hip flexor, knee extensors, ankle dorsiflexor and plantar flexor  Musculoskeletal: Full range of motion in all 4 extremities. No joint swelling, left hip tenderness over greater troch , no pain with hip ROM    Assessment/Plan: 1. Functional deficits which require 3+ hours per day of interdisciplinary therapy in a comprehensive inpatient rehab setting. Physiatrist is providing close team supervision and 24 hour management of active medical problems listed below. Physiatrist and rehab team continue to assess barriers to discharge/monitor patient progress toward functional and medical goals  Care Tool:  Bathing  Bathing activity did not occur: Safety/medical concerns Body parts bathed by patient: Right arm, Left arm, Chest, Abdomen, Front perineal area, Buttocks, Right upper leg, Left upper leg, Right lower leg, Left lower leg, Face   Body parts bathed by helper: Right arm, Left arm, Chest, Buttocks, Right upper leg, Left upper leg, Right lower leg, Left lower leg, Face     Bathing assist Assist Level: Contact Guard/Touching  assist     Upper Body Dressing/Undressing Upper body dressing Upper body dressing/undressing activity did not occur (including orthotics): Safety/medical concerns What is the patient wearing?: Pull over shirt    Upper body assist Assist Level: Supervision/Verbal cueing    Lower Body Dressing/Undressing Lower body dressing    Lower body dressing activity did not occur: Safety/medical concerns What is the patient wearing?: Pants, Incontinence brief     Lower body assist Assist for lower body dressing: Minimal Assistance - Patient > 75%     Toileting Toileting    Toileting assist Assist for  toileting: Minimal Assistance - Patient > 75%     Transfers Chair/bed transfer  Transfers assist  Chair/bed transfer activity did not occur: Safety/medical concerns  Chair/bed transfer assist level: Contact Guard/Touching assist Chair/bed transfer assistive device: Programmer, multimedia   Ambulation assist   Ambulation activity did not occur: Safety/medical concerns  Assist level: Contact Guard/Touching assist Assistive device: Walker-rolling Max distance: 114ft   Walk 10 feet activity   Assist     Assist level: 2 helpers Assistive device: Hand held assist   Walk 50 feet activity   Assist Walk 50 feet with 2 turns activity did not occur: Safety/medical concerns         Walk 150 feet activity   Assist Walk 150 feet activity did not occur: Safety/medical concerns         Walk 10 feet on uneven surface  activity   Assist Walk 10 feet on uneven surfaces activity did not occur: Safety/medical concerns         Wheelchair     Assist Is the patient using a wheelchair?: Yes Type of Wheelchair: Manual    Wheelchair assist level: Dependent - Patient 0% Max wheelchair distance: 233ft    Wheelchair 50 feet with 2 turns activity    Assist        Assist Level: Dependent - Patient 0%   Wheelchair 150 feet activity     Assist      Assist Level: Dependent - Patient 0%   Blood pressure (!) 170/84, pulse 63, temperature (!) 97.4 F (36.3 C), temperature source Oral, resp. rate 18, height 6' 0.01" (1.829 m), weight 92.1 kg, SpO2 95 %.  Medical Problem List and Plan: 1. Functional deficits secondary to multifocal acute/early subacute cortical ischemia in the anterior right MCA territory, likely large vessel source from critical right ICA stenosis.  Status post right ICA stenting.  Left caudate small infarct>> procedure related versus occult atrial fibrillation.             -patient may shower             -ELOS/Goals: tent d/c  1/26 supervision to min assist with PT, OT, and SLP  Continue CIR- PT, OT and SLP 2.  Antithrombotics: -DVT/anticoagulation:  Pharmaceutical: Lovenox             -antiplatelet therapy: Aspirin, Brilinta 3. Pain Management:  Left hip pain , resolved  Left shoulder pain - avoiding both NSAIDs and acetaminophen, will use tramadol prn   4. Mood: LCSW to evaluate and provide emotional support             -antipsychotic agents: N/A 5. Neuropsych: This patient is capable of making decisions on his own behalf. 6. Skin/Wound Care: Routine skin care checks 7. Fluids/Electrolytes/Nutrition: Routine ins and outs and follow up chemistries 8.  Bibasilar PNA- afebrile , leukocytosis resolved has persistent Xray abnormalities              --  On Unasyn  7 d course planned - recheck CXR, plan to d/c unasyn but given Xray Cont po augmentin for an additional week              --pt states cough has improved "100%"  afebrile  1/16- continued small cough- WBC down to 8.4k- con't regimen 9.  Frequent PVCs: Sees Dr. Stanford Breed as an outpatient  --recommends loop recorder>> placed 1/10 10: Hypertension: monitor off Benicar  Vitals:   03/22/21 0354 03/22/21 0745  BP: (!) 167/63 (!) 170/84  Pulse: (!) 59 63  Resp: 20 18  Temp: (!) 97.4 F (36.3 C) (!) 97.4 F (36.3 C)  SpO2: 97% 95%  Increase avapro to 150mg  daily  11: Hyperlipidemia: Zetia and Lipitor 12: EtOH use: advised to drink no more than 1 alcoholic beverage dialy 13: Overweight: BMI = 28.5. Rec: weight loss, diet and exercise as appropriate 14: CKD stage 3a: serum Cr = 0.99. Monitor At baseline  16: Hypokalemia: will need ongoing supplementation. Additional 30meq added 1/21.  17: Anemia: asymptomatic, mild stable  CBC Latest Ref Rng & Units 03/20/2021 03/19/2021 03/12/2021  WBC 4.0 - 10.5 K/uL 10.3 7.5 8.4  Hemoglobin 13.0 - 17.0 g/dL 11.1(L) 10.1(L) 10.4(L)  Hematocrit 39.0 - 52.0 % 34.4(L) 31.6(L) 33.1(L)  Platelets 150 - 400 K/uL 359 314 384   HGB remains stable , improving  18: Thyroid nodule: follow-up ultrasound in one year recommended 19. Dysphagia: continue D with nectars, aspiration precautions             -advance per SLP  Poor intake, pt dislikes modified diet, hopefully can upgrade soon per SLP Start Chocolate boost   20.  Lethargy , improved, building endurance for therapy, medications reviewed for sedating medications and none noted.   21. Eye irritation: eye drops added.  22. Fever/feeling hot/signs of congestion: blood cultures and urine cultures, CXR, labs ordered and no signs of new/worsening infection noted.  23. Transaminitis: Tylenol d/ced, repeat CMP 1/22 shows minimal elevation of AlkP and ALT, AST is normal    LOS: 13 days A FACE TO FACE EVALUATION WAS PERFORMED  Charlett Blake 03/22/2021, 8:48 AM

## 2021-03-22 NOTE — Plan of Care (Signed)
°  Problem: Sit to Stand Goal: LTG:  Patient will perform sit to stand with assistance level (PT) Description: LTG:  Patient will perform sit to stand with assistance level (PT) Flowsheets (Taken 03/22/2021 1610) LTG: PT will perform sit to stand in preparation for functional mobility with assistance level: Contact Guard/Touching assist Note: Downgraded due to patient progress   Problem: RH Bed to Chair Transfers Goal: LTG Patient will perform bed/chair transfers w/assist (PT) Description: LTG: Patient will perform bed to chair transfers with assistance (PT). Flowsheets (Taken 03/22/2021 1610) LTG: Pt will perform Bed to Chair Transfers with assistance level: Contact Guard/Touching assist Note: Downgraded due to patient progress   Problem: RH Car Transfers Goal: LTG Patient will perform car transfers with assist (PT) Description: LTG: Patient will perform car transfers with assistance (PT). Flowsheets (Taken 03/22/2021 1610) LTG: Pt will perform car transfers with assist:: Contact Guard/Touching assist Note: Downgraded due to patient progress   Problem: RH Ambulation Goal: LTG Patient will ambulate in controlled environment (PT) Description: LTG: Patient will ambulate in a controlled environment, # of feet with assistance (PT). Flowsheets (Taken 03/22/2021 1610) LTG: Pt will ambulate in controlled environ  assist needed:: Contact Guard/Touching assist Note: Downgraded due to patient progress Goal: LTG Patient will ambulate in home environment (PT) Description: LTG: Patient will ambulate in home environment, # of feet with assistance (PT). Flowsheets (Taken 03/22/2021 1610) LTG: Pt will ambulate in home environ  assist needed:: Contact Guard/Touching assist Note: Downgraded due to patient progress   Problem: RH Stairs Goal: LTG Patient will ambulate up and down stairs w/assist (PT) Description: LTG: Patient will ambulate up and down # of stairs with assistance (PT) Flowsheets (Taken  03/22/2021 1610) LTG: Pt will ambulate up/down stairs assist needed:: Minimal Assistance - Patient > 75% Note: Downgraded due to patient progress

## 2021-03-22 NOTE — Progress Notes (Signed)
Speech Language Pathology Daily Session Note  Patient Details  Name: David [name] MRN: [id number] Date of Birth: [birth year]  Today's Date: 03/22/2021 SLP Individual Time: 1301-1330 SLP Individual Time Calculation (min): 29 min  Short Term Goals: Week 2: SLP Short Term Goal 2 (Week 2): STG=LTG due to ELOS  Skilled Therapeutic Interventions:Skilled ST services focused on swallow skills. Pt's wife and daughter were present for treatment. Pt's wife was feeding pt a mixed fruit cup, but supported draining liquid from spoon. SLP provided education pertaining to nectar thick liquids verse thin liquids as well as scheduled instrumental swallow assessment tomorrow to assess current swallow function. Pt was very lethargic during today's session, family supports due to limited sleep, SLP notified pt's nurse. SLP provided oral care prior to thin liquid trials via ice chips and 1/2 TSP. Pt required max A verbal cues to recall chin tuck strategy and secondary swallow during trials, drifting off to sleep. Pt demonstrated no overt s/s aspiration when utilizing strategies, although chin tucks were not completed fully in a consistent manner. However, when pt did not utilize strategies (likely due to memory/fatigue) pt demonstrated overt s/s aspiration immediate cough on 50% of opportunities. SLP ordered MBS for tomorrow assess swallow function and need for compensatory strategies. Pt was left in room family, with call bell within reach and bed alarm set. SLP recommends to continue skilled services.     Pain Pain Assessment Pain Score: 0-No pain  Therapy/Group: Individual Therapy  Halona Amstutz  [facility] 03/22/2021, 4:04 PM

## 2021-03-22 NOTE — Progress Notes (Signed)
Physical Therapy Session Note  Patient Details  Name: David [name] MRN: [id number] Date of Birth: 1940-06-14  Today's Date: 03/22/2021 PT Individual Time: 1000-1055; 7026- 1420 PT Individual Time Calculation (min): 55 min and 35 mins (missed 25 mins due to fatigue)  Short Term Goals: Week 2:  PT Short Term Goal 1 (Week 2): = to LTGs based on ELOS  Skilled Therapeutic Interventions/Progress Updates:    Session 1: Patient received sitting up in wc, agreeable to PT. He denies pain, but endorses fatigue and feeling "weak." Currently on 1L O2 via [place] and RN communicating to PT that team is trying to wean patient off of O2. PT transporting patient in wc to therapy gym on 4W for time management and energy conservation. NuStep completed for improved CV endurance/conditioning on 1L O2. Initial O2: 94%. After 3 mins: 94%. After prolonged rest break on .5L, O2: 96%. 3 Min on .5L: 96-97%, but then decreasing to 94-93% after 1 min rest. 3 mins on .5L: 94%. 2 mins and then patient had to stop due to fatigue: 94% on .5L. Patient with very slow stand pivot back to wc with RW and increased assist needed (MinA). PT transporting patient back to room in wc. Short distance ambulatory transfer to bed with RW and MinA. Very slow gait and very slow transition to supine with supervision and verbal cues for positioning in bed. Patient remaining in bed, bed alarm on, call light within reach, RN aware of patients limited tolerance to therapy.   Session 2: Patient received reclined in bed, asleep and difficult to wake. Multimodal cuing needed to wake patient. Wife had informed RN and PT that she wanted patient to eat snacks due to not eating lunch. Patient requiring prolonged time to wake sufficiently to participate in therapy. He came to sit edge of bed with MinA. MaxA to scoot anteriorly to allow B LES to be supported on the ground- patient not generating enough power to do so on his own. Patient consuming 2-3 bites of  pudding with full supervision and verbal cues for smaller bites and complete swallow. Patient requiring consistent CGA/MinA to maintain static sitting balance edge of bed with B LE supported on ground and R UE supported on bed. Patient often closing his eyes and appearing to fall back asleep. NT present to assess vitals. Patient consistently reporting that he feels "fine," but persistent R lateral lean and extreme lethargy this session. Patient returning supine with MaxA due to limited ability to maintain arousal to participate in therapy. Dependent repositioning in bed. Bed alarm on, call light within reach, RN informed of patients tolerance to therapy.   Therapy Documentation Precautions:  Precautions Precautions: Fall, Other (comment) Precaution Comments: L hemiparesis Restrictions Weight Bearing Restrictions: No    Therapy/Group: Individual Therapy  Karoline Caldwell, PT, DPT, CBIS  03/22/2021, 7:38 AM

## 2021-03-22 NOTE — Plan of Care (Signed)
°  Problem: RH Simple Meal Prep Goal: LTG Patient will perform simple meal prep w/assist (OT) Description: LTG: Patient will perform simple meal prep with assistance, with/without cues (OT). Outcome: Not Applicable Flowsheets (Taken 03/22/2021 1600) LTG: Pt will perform simple meal prep with assistance level of: (goal discharged) -- Note: Goal discharged

## 2021-03-22 NOTE — Progress Notes (Signed)
Occupational Therapy Session Note  Patient Details  Name: David [name] MRN: [id number] Date of Birth: [birth year]  Today's Date: 03/22/2021 OT Individual Time: 0800-0902 OT Individual Time Calculation (min): 62 min    Short Term Goals: Week 2:  OT Short Term Goal 1 (Week 2): Pt will complete UB dressing at supervision level sitting unsupported. OT Short Term Goal 2 (Week 2): Pt will complete LB dressing with min assist sit to stand for all aspects. OT Short Term Goal 3 (Week 2): Pt will complete two grooming tasks in standing at the sink with min guard assist for at least 3 mins.  Skilled Therapeutic Interventions/Progress Updates:    Pt completed bathing and dressing during session.  Noted pt more lethargic this session with decreased initiation and processing speed throughout bathing and dressing tasks.  He needed min assist for supine to sit with min assist for ambulation from the EOB to the shower bench with use of the RW for support.  Pt with bladder incontinence in standing in the shower when removing pants and brief.  Urinal was placed to catch some of the urine.  He was able to complete bathing with increased time but needed mod instructional cueing for sequencing secondary to washing a small part here and there.  After drying off, he transferred back out to the wheelchair for dressing.  Max assist needed for all dressing secondary to decreased time as well as pt staring and turning the items around several times, but having trouble figuring out how to start task.  Min assist for standing to pull items over hips however.  Oxygen sats at 92% throughout on room air with selfcare tasks, however O2 replaced back on pt at end of session.  He was left sitting up in the wheelchair and with the call button and phone in reach.  Safety alarm belt in place as well.  Pt was oriented to place and situation but missed the day of the week stating "Sunday".    Therapy Documentation Precautions:   Precautions Precautions: Fall, Other (comment) Precaution Comments: L hemiparesis Restrictions Weight Bearing Restrictions: No  Pain: Pain Assessment Pain Scale: Faces Pain Score: 0-No pain Faces Pain Scale: No hurt ADL: See    Therapy/Group: Individual Therapy  Prarthana Parlin OTR/L 03/22/2021, 9:53 AM

## 2021-03-22 NOTE — Progress Notes (Signed)
Patient sitting up in bed with attendant RN who is helping him with cup of thickened Ensure. He is alert and oriented times 4. Encouraged and demonstrated chin-tuck with swallows. Ensured patient in full upright position. He has loose cough with several swallows. Asked RN to place aspiration precautions alert in room.

## 2021-03-23 ENCOUNTER — Inpatient Hospital Stay (HOSPITAL_COMMUNITY): Payer: Medicare Other

## 2021-03-23 LAB — URINALYSIS, ROUTINE W REFLEX MICROSCOPIC
Bilirubin Urine: NEGATIVE
Glucose, UA: NEGATIVE mg/dL
Hgb urine dipstick: NEGATIVE
Ketones, ur: 5 mg/dL — AB
Leukocytes,Ua: NEGATIVE
Nitrite: POSITIVE — AB
Protein, ur: NEGATIVE mg/dL
Specific Gravity, Urine: 1.014 (ref 1.005–1.030)
pH: 6 (ref 5.0–8.0)

## 2021-03-23 LAB — CK: Total CK: 42 U/L — ABNORMAL LOW (ref 49–397)

## 2021-03-23 MED ORDER — CEFDINIR 300 MG PO CAPS
300.0000 mg | ORAL_CAPSULE | Freq: Two times a day (BID) | ORAL | Status: DC
  Administered 2021-03-24 – 2021-03-27 (×7): 300 mg via ORAL
  Filled 2021-03-23 (×10): qty 1

## 2021-03-23 MED ORDER — SODIUM CHLORIDE 0.45 % IV SOLN: INTRAVENOUS | Status: AC

## 2021-03-23 MED ORDER — ALBUTEROL SULFATE (2.5 MG/3ML) 0.083% IN NEBU: 3.0000 mL | INHALATION_SOLUTION | Freq: Four times a day (QID) | RESPIRATORY_TRACT | Status: DC

## 2021-03-23 MED ORDER — ALBUTEROL SULFATE (2.5 MG/3ML) 0.083% IN NEBU
3.0000 mL | INHALATION_SOLUTION | Freq: Four times a day (QID) | RESPIRATORY_TRACT | Status: DC
  Administered 2021-03-23 – 2021-03-24 (×5): 3 mL via RESPIRATORY_TRACT
  Filled 2021-03-23 (×5): qty 3

## 2021-03-23 MED ORDER — SODIUM CHLORIDE 0.9 % IV SOLN
2.0000 g | Freq: Once | INTRAVENOUS | Status: AC
  Administered 2021-03-23: 2 g via INTRAVENOUS
  Filled 2021-03-23: qty 20

## 2021-03-23 NOTE — Progress Notes (Signed)
Initial Nutrition Assessment  DOCUMENTATION CODES:   Not applicable  INTERVENTION:   Discontinue Boost Breeze Tube Feed once Cortrak is placed: Start Osmolite 1.5 @ 25 mL and advance by 10 mL q8h until goal of 55 mL/hr is reached (1320 mL/hr) 45 mL ProSource TF - TID 200 mL free water flushes q4h Provides 2100 kcal, 116 gm PRO, 1006 mL free water (2206 mL total free water) daily  NUTRITION DIAGNOSIS:   Inadequate oral intake related to poor appetite as evidenced by per patient/family report.  GOAL:   Patient will meet greater than or equal to 90% of their needs  MONITOR:   Labs, TF tolerance, Weight trends, Diet advancement  REASON FOR ASSESSMENT:   Consult Assessment of nutrition requirement/status, Calorie Count, Enteral/tube feeding initiation and management  ASSESSMENT:   81 y.o. male admitted to rehab post MCA stroke, requiring an ICA stent. PMH includes HTN, prostate cancer, CAD, and GERD.   Pt sleeping at time of visit, pt did not wake during RD visit. Pt son-in-law present at bedside and able to provide nutrition information.   Family reports that he has not been eating well due to not liking the hospital food. Family reports that pt does better with verbal encouragement. Family has done a lot of encouragement and reminding him that it is important to eat. Family reports that he has been eating Magic Cups and pudding packs a lot, which he enjoys both. Discussed that it is difficult to provide supplements on thickened liquids, family understands. Discussed that they have been thinking of items to provide to pt from home as well.  Per EMR, pt intake includes:  1/21: Dinner 20% 1/22: Breakfast 15%, Lunch 50% 1/23: Breakfast 25%, Lunch 50%, Dinner 25% 1/24: Breakfast 50%  No recent weight within chart. Pt weight within the past year has been stable over the past year.   Per PA note, plan for Cortrak tomorrow.   Medications reviewed and include: Augmentin, Megace,  Protonix, Potasium Chloride Labs reviewed.  NUTRITION - FOCUSED PHYSICAL EXAM:  Deferred due to pt sleeping.   Diet Order:   Diet Order             Diet NPO time specified  Diet effective now                   EDUCATION NEEDS:   No education needs have been identified at this time  Skin:  Skin Assessment: Reviewed RN Assessment  Last BM:  01/20  Height:   Ht Readings from Last 1 Encounters:  03/02/2021 6' 0.01" (1.829 m)    Weight:   Wt Readings from Last 1 Encounters:  03/07/2021 92.1 kg    Ideal Body Weight:  80.9 kg  BMI:  Body mass index is 27.53 kg/m.  Estimated Nutritional Needs:   Kcal:  2100-2300  Protein:  105-120 grams  Fluid:  >/= 2.1 L    Ladeana Laplant Louie Casa, RD, LDN Clinical Dietitian See [facility] for contact information.

## 2021-03-23 NOTE — Progress Notes (Signed)
Occupational Therapy Session Note  Patient Details  Name: David [name] MRN: [id number] Date of Birth: 12/08/1940  Today's Date: 03/23/2021 OT Individual Time: 1004-1106 OT Individual Time Calculation (min): 62 min    Short Term Goals: Week 2:  OT Short Term Goal 1 (Week 2): Pt will complete UB dressing at supervision level sitting unsupported. OT Short Term Goal 2 (Week 2): Pt will complete LB dressing with min assist sit to stand for all aspects. OT Short Term Goal 3 (Week 2): Pt will complete two grooming tasks in standing at the sink with min guard assist for at least 3 mins.  Skilled Therapeutic Interventions/Progress Updates:    Pt in bed asleep to start session.  He was awoken to verbal stimulation but still quite sleepy.  Pt's son-in-law present to start.  Discussed his current level of assist the past day as well as his lethargy.  Had pt transfer to the EOB with min assist and increased time.  Decided to have him work on grooming tasks in sitting to see how he would initiate and complete today vs yesterday.  Attempted shaving with the electric razor. He needed mod hand over hand assist for integration of the RUE to shave.  Most of the time, he demonstrated decreased ability to keep it on his skin and move it with the contours of his face.  Therapist then assisted with cleaning out of his mouth using the suction brush with max assist.  Had him complete stand pivot transfer to the recliner with mod assist from the bed.  Noted short steps compared to two days ago and pt stopping half way and needing cueing to continue.  Oxygen sats throughout session and with transfer at 92% or greater on room air.  Finished session with transfer back to the bed at min assist with increased time.  He then transferred to supine at min assist level.  He was left with nursing and family in the room and with the call button and phone in reach.  Pt continues with functional decline compared to last week.     Therapy Documentation Precautions:  Precautions Precautions: Fall, Other (comment) Precaution Comments: L hemiparesis Restrictions Weight Bearing Restrictions: No  Pain: Pain Assessment Pain Scale: Faces Pain Score: 0-No pain ADL: See Care Tool Section for some details of mobility and selfcare   Therapy/Group: Individual Therapy  Inez Stantz OTR/L 03/23/2021, 12:27 PM

## 2021-03-23 NOTE — Progress Notes (Signed)
Spoke to patient regarding pulling out NG tube. Patient verbalized that it was very uncomfortable and he doesn't want to NG tube to be placed tonight. Pt is scheduled for Cortrak tomorrow. Pt's evening medications not given, Omnicef is not available in IV, pt will receive Rocephin IV x1  tonight. Everything above discussed with on call NP.

## 2021-03-23 NOTE — Progress Notes (Signed)
Modified Barium Swallow Progress Note  Patient Details  Name: David [name] MRN: [id number] Date of Birth: 1941/01/06  Today's Date: 03/23/2021  Modified Barium Swallow completed.  Full report located under Chart Review in the Imaging Section.  Brief recommendations include the following:  Clinical Impression   Pt presents with severe oropharyngeal dysphagia charactered by primary overall pharyngeal weakness as well as reduced oral control, delay in swallow initiation, reduced epiglottic deflection and restrictive UES opening secondary to curvature of the spine. Pt's oral phase is marked by reduced bolus cohesion, reduced AP transport, delay in swallow initiation and a premature spillage with thin liquids when using a chin tuck strategy. Pt initiated swallow at the pyriform sinuses for thin liquids and at the valleculae for nectar thick liquids, honey thick liquids, dys textures and dys 3 textures. Pt's overall pharyngeal weakness resulted in sensed (PAS 7) and silent aspiration (PAS 8) with all textures (except for dys 3 textures, however moderate residue was noted in the pyriform sinuses.) Pt was unable to demonstrate complete laryngeal vestibule closure (due to pharyngeal weakness and reduced epiglottic deflection) as well as moderate pharyngeal residue (increasing along with the viscosity of the trialed substances) resulting in consistent penetration during the swallow (PAS 2 and PAS 3) on all textures, that was only partially cleared with cued second swallows. Pt demonstrated sensed aspiration (PAS 7) of thin liquids after the swallow from mild pyriform sinus residue, which he was unable to eject aspirate with weak cough. Pt's use of cued compensatory strategy of a chin tuck, did reduce the occurrences of aspiration, except for x1 occurrence when consuming nectar thick liquids resulting in silent aspiration (PAS 8) during/after the swallow due to incomplete epiglottic deflection and pyriform sinus  residue spilling into the laryngeal vestibule. The use of a right and left head turn resulted in silent aspiration (PAS 8) of honey thick liquids and x1 occurrence of silent aspiration (PAS 8) when consuming dys 1 textures without any compensatory strategies. SLP recommends diet NPO and alternative means of nutrition due to the severity of the above-mentioned deficits, reduced cognitive carryover of chin tuck strategy (that did not completely eliminate risk of aspiration when utilized accurately), h/o of pneumonia and pt's overall weakness all increase risk of aspiration pneumonia. SLP will address pharyngeal strength, lung strength (continue with RMT) and carryover of compensatory strategies (chin tuck with secondary swallow)with trials of ice chips/thin liquid via TSP at bedside, prior to repeat instrumental swallow assessment needed to upgrade pt to a PO diet.    Swallow Evaluation Recommendations       SLP Diet Recommendations: NPO    Medication Administration: Via alternative means   Supervision: Comment (NPO)    Other Recommendations: Have oral suction available;Remove water pitcher    David [name] 03/23/2021,3:55 PM

## 2021-03-23 NOTE — Progress Notes (Signed)
Patient ID: David [name], male   DOB: Apr 14, 1940, 81 y.o.   MRN: [id number]  Patient [facility] referral sent to [facility]

## 2021-03-23 NOTE — Progress Notes (Signed)
Speech Language Pathology Daily Session Note  Patient Details  Name: KASHMIR LYSAGHT MRN: [id number] Date of Birth: 1940-12-18  Today's Date: 03/23/2021 SLP Individual Time: 1227-1250 SLP Individual Time Calculation (min): 23 min  Short Term Goals: Week 2: SLP Short Term Goal 2 (Week 2): STG=LTG due to ELOS  Skilled Therapeutic Interventions: Skilled ST services focused on education. SLP provided education to pt, pt's wife, daughter and son-in-law pertaining to today's instrumental swallow assessment. SLP provided education of consistent penetration and inconsistent aspiration on most substances trailed. SLP had a colleague review imaging slides prior to education and requested clinical specialist to review slides as well to determine need for NPO status verse honey thick liquids and puree diet with strict precautions (chin tuck and secondary swallow.) SLP provided education pertaining to what diet verse NPO diet would entail and answered all questions to satisfaction. SLP will make determination with SLP team. Pt was left in the room with family.   As of note, SLP determined a diet of NPO since pt is at a high risk of aspiration, due to severity of swallow function and deconditioning. SLP communicated recommendations to pt and pt's son-in-law.     Pain Pain Assessment Pain Score: 0-No pain  Therapy/Group: Individual Therapy  Taurus Willis  [facility] 03/23/2021, 3:57 PM

## 2021-03-23 NOTE — Plan of Care (Signed)
°  Problem: Consults Goal: RH STROKE PATIENT EDUCATION Description: See Patient Education module for education specifics  Outcome: Not Progressing   Problem: RH BOWEL ELIMINATION Goal: RH STG MANAGE BOWEL WITH ASSISTANCE Description: STG Manage Bowel without Assistance. Outcome: Not Progressing Goal: RH STG MANAGE BOWEL W/MEDICATION W/ASSISTANCE Description: STG Manage Bowel with Medication with mod I  Assistance. Outcome: Not Progressing   Problem: RH BLADDER ELIMINATION Goal: RH STG MANAGE BLADDER WITH ASSISTANCE Description: STG Manage Bladder Without Assistance Outcome: Not Progressing   Problem: RH SAFETY Goal: RH STG ADHERE TO SAFETY PRECAUTIONS W/ASSISTANCE/DEVICE Description: STG Adhere to Safety Precautions With cues Assistance/Device. Outcome: Not Progressing   Problem: RH PAIN MANAGEMENT Goal: RH STG PAIN MANAGED AT OR BELOW PT'S PAIN GOAL Description: At or below level 4 with prns Outcome: Not Progressing   Problem: RH KNOWLEDGE DEFICIT Goal: RH STG INCREASE KNOWLEDGE OF DIABETES Description: Patient will be able to manage DM with dietary modifications using handouts and educational resources independently Outcome: Not Progressing Goal: RH STG INCREASE KNOWLEDGE OF HYPERTENSION Description: Patient will be able to manage HTN with medications and dietary modifications using handouts and educational resources independently Outcome: Not Progressing Goal: RH STG INCREASE KNOWLEDGE OF DYSPHAGIA/FLUID INTAKE Description: Patient will be able to manage Dysphagia, dietary modifications using handouts and educational resources independently Outcome: Not Progressing Goal: RH STG INCREASE KNOWLEGDE OF HYPERLIPIDEMIA Description: Patient will be able to manage HLD with medications and dietary modifications using handouts and educational resources independently Outcome: Not Progressing Goal: RH STG INCREASE KNOWLEDGE OF STROKE PROPHYLAXIS Description: Patient will be able to  manage secondary stroke risk with medications and  dietary modifications using handouts and educational resources independently Outcome: Not Progressing

## 2021-03-23 NOTE — Progress Notes (Signed)
Pt removed his NG tube. On call NP was made aware.

## 2021-03-23 NOTE — Progress Notes (Signed)
Occupational Therapy Session Note ° °Patient Details  °Name: David [name] °MRN: [id number] °Date of Birth: 06/15/1940 ° °Today's Date: 03/23/2021 °OT Individual Time: 0735-0800 °OT Individual Time Calculation (min): 25 min  ° ° °Short Term Goals: °Week 2:  OT Short Term Goal 1 (Week 2): Pt will complete UB dressing at supervision level sitting unsupported. °OT Short Term Goal 2 (Week 2): Pt will complete LB dressing with min assist sit to stand for all aspects. °OT Short Term Goal 3 (Week 2): Pt will complete two grooming tasks in standing at the sink with min guard assist for at least 3 mins. ° ° °Skilled Therapeutic Interventions/Progress Updates:  °  Pt received supine with no c/o pain. He was overall slow to initiate and compared to the one other time this therapist has seen this pt- he is overall cognitively worse- very slow processing. Pt unable to bring cup to his mouth- requiring max A to bring to mouth, no overt s/s of aspiration while consuming 2 sips of nectar thick water. He came to EOB with min A with mod cueing for initiation. Pt stable on 1.5 L O2 via [place]. Pt completed sit >stand from EOB with min A and had urinary incontinence so brief was changed with max A. Pants donned with max A- poor motor planning/sequence. Pt was left sitting up with all needs met, NT present to supervise consumption of breakfast.  ° °Therapy Documentation °Precautions:  °Precautions °Precautions: Fall, Other (comment) °Precaution Comments: L hemiparesis °Restrictions °Weight Bearing Restrictions: No ° °Therapy/Group: Individual Therapy ° ° H  °03/23/2021, 6:34 AM °

## 2021-03-23 NOTE — Progress Notes (Addendum)
PROGRESS NOTE   Subjective/Complaints:  Nursing and therapy concerned that has declined in terms of level of alertness.  His swallowing function has also worsened.  No new focal weakness has been noted Discussed patient's condition with daughter as well as son-in-law  ROS-  Pt denies SOB, abd pain, CP, N/V/C/D, and vision changes,    Objective:   No results found. Recent Labs    03/20/21 1916  WBC 10.3  HGB 11.1*  HCT 34.4*  PLT 359    Recent Labs    03/20/21 1916 03/21/21 0646  NA 140 141  K 3.4* 3.8  CL 110 111  CO2 23 21*  GLUCOSE 129* 100*  BUN 10 11  CREATININE 0.86 0.89  CALCIUM 9.6 9.3      Intake/Output Summary (Last 24 hours) at 03/23/2021 0846 Last data filed at 03/23/2021 0834 Gross per 24 hour  Intake 690 ml  Output 675 ml  Net 15 ml         Physical Exam: Vital Signs Blood pressure (!) 155/65, pulse (!) 57, temperature 97.8 F (36.6 C), resp. rate 17, height 6' 0.01" (1.829 m), weight 92.1 kg, SpO2 93 %.  General: No acute distress Mood and affect are appropriate Heart: Regular rate and rhythm no rubs murmurs or extra sounds Lungs: Clear to auscultation, breathing unlabored, no rales or wheezes Abdomen: Positive bowel sounds, soft nontender to palpation, nondistended Extremities: No clubbing, cyanosis, or edema  Skin: No evidence of breakdown, no evidence of rash, loop site CDI, no hematoma palpated  Neurologic: Cranial nerves II through XII intact, motor strength is 4/5 in bilateral deltoid, bicep, tricep, grip, hip flexor, knee extensors, ankle dorsiflexor and plantar flexor unchanged  Musculoskeletal: Full range of motion in all 4 extremities. No joint swelling, left hip tenderness over greater troch , no pain with hip ROM    Assessment/Plan: 1. Functional deficits which require 3+ hours per day of interdisciplinary therapy in a comprehensive inpatient rehab  setting. Physiatrist is providing close team supervision and 24 hour management of active medical problems listed below. Physiatrist and rehab team continue to assess barriers to discharge/monitor patient progress toward functional and medical goals  Care Tool:  Bathing  Bathing activity did not occur: Safety/medical concerns Body parts bathed by patient: Right arm, Left arm, Chest, Abdomen, Front perineal area, Buttocks, Right upper leg, Left upper leg, Right lower leg, Left lower leg, Face   Body parts bathed by helper: Right arm, Left arm, Chest, Buttocks, Right upper leg, Left upper leg, Right lower leg, Left lower leg, Face     Bathing assist Assist Level: Contact Guard/Touching assist     Upper Body Dressing/Undressing Upper body dressing Upper body dressing/undressing activity did not occur (including orthotics): Safety/medical concerns What is the patient wearing?: Pull over shirt    Upper body assist Assist Level: Supervision/Verbal cueing    Lower Body Dressing/Undressing Lower body dressing    Lower body dressing activity did not occur: Safety/medical concerns What is the patient wearing?: Pants, Incontinence brief     Lower body assist Assist for lower body dressing: Minimal Assistance - Patient > 75%     Toileting Toileting  Toileting assist Assist for toileting: Minimal Assistance - Patient > 75%     Transfers Chair/bed transfer  Transfers assist  Chair/bed transfer activity did not occur: Safety/medical concerns  Chair/bed transfer assist level: Contact Guard/Touching assist Chair/bed transfer assistive device: Programmer, multimedia   Ambulation assist   Ambulation activity did not occur: Safety/medical concerns  Assist level: Contact Guard/Touching assist Assistive device: Walker-rolling Max distance: 175f   Walk 10 feet activity   Assist     Assist level: 2 helpers Assistive device: Hand held assist   Walk 50 feet  activity   Assist Walk 50 feet with 2 turns activity did not occur: Safety/medical concerns         Walk 150 feet activity   Assist Walk 150 feet activity did not occur: Safety/medical concerns         Walk 10 feet on uneven surface  activity   Assist Walk 10 feet on uneven surfaces activity did not occur: Safety/medical concerns         Wheelchair     Assist Is the patient using a wheelchair?: Yes Type of Wheelchair: Manual    Wheelchair assist level: Dependent - Patient 0% Max wheelchair distance: 2036f   Wheelchair 50 feet with 2 turns activity    Assist        Assist Level: Dependent - Patient 0%   Wheelchair 150 feet activity     Assist      Assist Level: Dependent - Patient 0%   Blood pressure (!) 155/65, pulse (!) 57, temperature 97.8 F (36.6 C), resp. rate 17, height 6' 0.01" (1.829 m), weight 92.1 kg, SpO2 93 %.  Medical Problem List and Plan: 1. Functional deficits secondary to multifocal acute/early subacute cortical ischemia in the anterior right MCA territory, likely large vessel source from critical right ICA stenosis.  Status post right ICA stenting.  Left caudate small infarct>> procedure related versus occult atrial fibrillation.             -Patient with decline in cognition in terms of level of energy.  Suspect that nutrition decreased fluid intake is likely contributing, recent labs were reviewed UA negative CBC and c-Met unremarkable.  Will order CT of the head.             -ELOS/Goals: tent d/c 1/26 supervision to min assist with PT, OT, and SLP  Continue CIR- PT, OT and SLP 2.  Antithrombotics: -DVT/anticoagulation:  Pharmaceutical: Lovenox             -antiplatelet therapy: Aspirin, Brilinta 3. Pain Management:  Left hip pain , resolved  Left shoulder pain - avoiding both NSAIDs and acetaminophen, will use tramadol prn   4. Mood: LCSW to evaluate and provide emotional support             -antipsychotic agents:  N/A 5. Neuropsych: This patient is capable of making decisions on his own behalf. 6. Skin/Wound Care: Routine skin care checks 7. Fluids/Electrolytes/Nutrition: Routine ins and outs and follow up chemistries 8.  Bibasilar PNA- afebrile , leukocytosis resolved has persistent Xray abnormalities              -- resolved after course of Unasyn followed by Augmentin             9.  Frequent PVCs: Sees Dr. CrStanford Breeds an outpatient  --recommends loop recorder>> placed 1/10 10: Hypertension: monitor off Benicar  Vitals:   03/22/21 1924 03/23/21 0323  BP: (!) 155/62 (![name]  155/65  Pulse: 66 (!) 57  Resp: 20 17  Temp: 97.7 F (36.5 C) 97.8 F (36.6 C)  SpO2: 96% 93%  Increase avapro to 127m daily  11: Hyperlipidemia: Zetia and Lipitor 12: EtOH use: advised to drink no more than 1 alcoholic beverage dialy 13: Overweight: BMI = 28.5. Rec: weight loss, diet and exercise as appropriate 14: CKD stage 3a: serum Cr = 0.99. Monitor At baseline  16: Hypokalemia: will need ongoing supplementation. Additional 454m added 1/21.  17: Anemia: asymptomatic, mild stable  CBC Latest Ref Rng & Units 03/20/2021 03/19/2021 03/12/2021  WBC 4.0 - 10.5 K/uL 10.3 7.5 8.4  Hemoglobin 13.0 - 17.0 g/dL 11.1(L) 10.1(L) 10.4(L)  Hematocrit 39.0 - 52.0 % 34.4(L) 31.6(L) 33.1(L)  Platelets 150 - 400 K/uL 359 314 384  HGB remains stable , improving  18: Thyroid nodule: follow-up ultrasound in one year recommended 19. Dysphagia: continue D with nectars, aspiration precautions             -advance per SLP  Poor intake, pt dislikes modified diet, hopefully can upgrade soon per SLP Start Chocolate boost  RD consulted but no note yet   20.  Lethargy , improved, building endurance for therapy, medications reviewed for sedating medications and none noted.   21. Eye irritation: eye drops added.  22. Lethargy: CT head showing no new infarct, but there is evidence of acute frontal and paranasal sinusitis, has been on Augmentin,  will switch to cephalosporin, fluroquinolone another option although with more potential side effects  UA neg, repeat CBC ,BMET in am 23. Transaminitis: Tylenol d/ced, repeat CMP 1/22 shows minimal elevation of AlkP and ALT, AST is normal    LOS: 14 days A FACE TO FACE EVALUATION WAS PERFORMED  AnCharlett Blake/24/2023, 8:46 AM

## 2021-03-23 NOTE — Progress Notes (Signed)
Inpatient Rehabilitation Discharge Medication Review by a Pharmacist  A complete drug regimen review was completed for this patient to identify any potential clinically significant medication issues.  High Risk Drug Classes Is patient taking? Indication by Medication  Antipsychotic No   Anticoagulant No   Antibiotic No Completed course  Opioid Yes Prn tramadol for pain  Antiplatelet Yes Brilinta, Aspirin s/p CVA stent  Hypoglycemics/insulin No   Vasoactive Medication Yes Benicar for HTN  Chemotherapy No   Other Yes Megace for appetite Cymbalta for mood Lipitor, Zetia for HLD Protonix for GERD     Type of Medication Issue Identified Description of Issue Recommendation(s)  Drug Interaction(s) (clinically significant)     Duplicate Therapy     Allergy     No Medication Administration End Date     Incorrect Dose     Additional Drug Therapy Needed     Significant med changes from prior encounter (inform family/care partners about these prior to discharge).    Other       Clinically significant medication issues were identified that warrant physician communication and completion of prescribed/recommended actions by midnight of the next day:  No  Pharmacist comments: None  Time spent performing this drug regimen review (minutes):  20 minutes   Tad Moore 03/23/2021 10:37 AM

## 2021-03-23 NOTE — Progress Notes (Addendum)
Discussed findings of MBS with SLP, attendant RN, patient and family. Son-in-law states he saw some barium in patient expectorant. NPO now and start 0.45% NS IVF at 70cc/hr. Chest x-ray in progress. CT head today is pending. Discussed Cortrak tube for meds and nutrition. Addendum: No Cortrak team available today. Will place order for NGT tube placement, confirm with KUB. Family at bedside requests repeat UA.

## 2021-03-23 NOTE — Progress Notes (Signed)
Speech Language Pathology Weekly Progress and Session Note  Patient Details  Name: David [name] MRN: [id number] Date of Birth: 1940/09/29  Beginning of progress report period: March 17, 2021 End of progress report period: March 24, 2021  Short Term Goals: Week 2: SLP Short Term Goal 2 (Week 2): STG=LTG due to ELOS  New Short Term Goals: Week 3: SLP Short Term Goal 1 (Week 3): STG=LTG due to ELOS  Weekly Progress Updates: Patient was making steady progress toward long-term goals set at supervision level in the areas of swallowing, speech, problem solving, memory and awareness however recently demonstrated a functional decline which unfortunately negatively impacted cognitive-linguistic and swallow functions. Patient currently max A for cognition with increased confusion, reduced processing, attention, and overall limited by lethargy. Pt obtained a modified barium swallow study to further evaluate oropharyngeal swallow function which revealed a significant decline resulting in recommendation for NPO status and alternate means of nutrition due to severe oropharyngeal swallow function, reduced cognitive carryover of chin tuck strategy (that did not completely eliminate risk of aspiration when utilized accurately), h/o of pneumonia and pt's overall weakness which increase the risk of adverse events related to aspiration such as pneumonia. Patient was initiated on the water protocol with ice chips only for pleasure, oral moisture, and to reduce risk for further decline with swallow strength. Family was educated and signed off on providing patient with single ice chips only when alert and sitting upright with cues to tuck chin and perform secondary swallow. Patient and family education is ongoing. Patient would benefit from continued skilled SLP intervention to maximize cognitive and swallow functioning and overall functional independence prior to discharge. Will continue to evaluate and modify goals  as needed.   Intensity: Minumum of 1-2 x/day, 30 to 90 minutes Frequency: 3 to 5 out of 7 days Duration/Length of Stay: 04/01/21 Treatment/Interventions: Cognitive remediation/compensation;Internal/external aids;Speech/Language facilitation;Cueing hierarchy;Dysphagia/aspiration precaution training;Functional tasks;Patient/family education;Therapeutic Activities  Patty Sermons 03/24/2021, 12:34 PM

## 2021-03-23 NOTE — Progress Notes (Signed)
Physical Therapy Session Note  Patient Details  Name: David [name] MRN: [id number] Date of Birth: 1940/05/03  Today's Date: 03/23/2021 PT Individual Time: 2094-7096; 1400-1445 PT Individual Time Calculation (min): 19 min  and 45 mins and Today's Date: 03/23/2021 PT Missed Time: 11 Minutes; 15 mins Missed Time Reason: Patient fatigue  Short Term Goals: Week 2:  PT Short Term Goal 1 (Week 2): = to LTGs based on ELOS  Skilled Therapeutic Interventions/Progress Updates:    Session 1: Patient received in bed, NT and family present at bedside. Family expressing concerns to PT regarding patients change in functional and mental status. PT reiterating that imaging has been ordered to assist with clarifying clinical picture. Patient requiring dependent assist to reposition in bed (previously required ~MinA). Family also reporting increased "coughing fits" in the past few hours. RN aware. PT asking patient about his morning and patient reporting, with slight dysarthria, "I walked down the hall to another room and sat there awhile." PT reorienting patient to AM schedule, time, place and situation. RN made aware of patients confusion. Patient frequently falling asleep midsentence or while PT was talking. Patient remaining in bed, bed alarm on, call light within reach, wife at bedside.   Session 2: Patient received reclined in bed, family at bedside. Patient remaining very lethargic, but agreeable to PT. SLP and PA present to discuss MBS results with patient and family- now NPO, patient later voicing frustration regarding this. Patient coming to sit edge of bed with ModA and verbal cues for sequencing. MaxA for anterior scoot to edge of bed. MaxA x2 to come to stand (where it was previously supervision/CGA) with RW. Patient with significant difficulty sequencing lateral steps toward head of bed. MaxA x2 to stand a second time to complete lateral stepping toward HOB. Patient returning supine with MaxA and  dependent repositioning in bed. Patient remaining in bed, bed alarm on, call light within reach, PT notified that imaging services were on their way for patient.   Therapy Documentation Precautions:  Precautions Precautions: Fall, Other (comment) Precaution Comments: L hemiparesis Restrictions Weight Bearing Restrictions: No    Therapy/Group: Individual Therapy  Karoline Caldwell, PT, DPT, CBIS  03/23/2021, 9:33 AM

## 2021-03-23 NOTE — Progress Notes (Signed)
David Spillers RN called regarding :Abdominal X-ray Results:  IMPRESSION: Nasogastric tube tip position within the moderate hiatal hernia. I also spoke with Radiologist:  He states "David [name] has a large hiatal hernia he would benefit having a Dobhoff tube being placed."  David [name] called back reporting David [name] pulled out the NG Tube, we will continue IV Fluids.  Cortrak team will place Cortrak tube in the morning.  He was scheduled for [facility], spoke with pharmacist,he will receive a dose of Rocephin tonight.  The above discussed with David [name], she verbalizes understanding.

## 2021-03-24 ENCOUNTER — Inpatient Hospital Stay (HOSPITAL_COMMUNITY): Payer: Medicare Other

## 2021-03-24 ENCOUNTER — Ambulatory Visit: Payer: Medicare Other

## 2021-03-24 LAB — CBC
HCT: 36.4 % — ABNORMAL LOW (ref 39.0–52.0)
Hemoglobin: 11.8 g/dL — ABNORMAL LOW (ref 13.0–17.0)
MCH: 29.1 pg (ref 26.0–34.0)
MCHC: 32.4 g/dL (ref 30.0–36.0)
MCV: 89.7 fL (ref 80.0–100.0)
Platelets: 334 10*3/uL (ref 150–400)
RBC: 4.06 MIL/uL — ABNORMAL LOW (ref 4.22–5.81)
RDW: 15.3 % (ref 11.5–15.5)
WBC: 9 10*3/uL (ref 4.0–10.5)
nRBC: 0 % (ref 0.0–0.2)

## 2021-03-24 LAB — BASIC METABOLIC PANEL
Anion gap: 10 (ref 5–15)
BUN: 7 mg/dL — ABNORMAL LOW (ref 8–23)
CO2: 20 mmol/L — ABNORMAL LOW (ref 22–32)
Calcium: 10 mg/dL (ref 8.9–10.3)
Chloride: 106 mmol/L (ref 98–111)
Creatinine, Ser: 0.92 mg/dL (ref 0.61–1.24)
GFR, Estimated: >60 mL/min (ref >60)
Glucose, Bld: 90 mg/dL (ref 70–99)
Potassium: 3.3 mmol/L — ABNORMAL LOW (ref 3.5–5.1)
Sodium: 136 mmol/L (ref 135–145)

## 2021-03-24 MED ORDER — POTASSIUM CHLORIDE 10 MEQ/100ML IV SOLN
10.0000 meq | INTRAVENOUS | Status: AC
  Administered 2021-03-24 (×2): 10 meq via INTRAVENOUS
  Filled 2021-03-24 (×2): qty 100

## 2021-03-24 MED ORDER — POTASSIUM CHLORIDE CRYS ER 20 MEQ PO TBCR
20.0000 meq | EXTENDED_RELEASE_TABLET | Freq: Two times a day (BID) | ORAL | Status: AC
  Administered 2021-03-25 – 2021-03-26 (×3): 20 meq via ORAL
  Filled 2021-03-24 (×3): qty 1

## 2021-03-24 MED ORDER — ALBUTEROL SULFATE (2.5 MG/3ML) 0.083% IN NEBU
3.0000 mL | INHALATION_SOLUTION | Freq: Three times a day (TID) | RESPIRATORY_TRACT | Status: DC
  Filled 2021-03-24 (×3): qty 3

## 2021-03-24 MED ORDER — METHYLPHENIDATE HCL 5 MG PO TABS
5.0000 mg | ORAL_TABLET | Freq: Two times a day (BID) | ORAL | Status: DC
  Administered 2021-03-25 – 2021-03-27 (×5): 5 mg via ORAL
  Filled 2021-03-24 (×5): qty 1

## 2021-03-24 MED ORDER — ALBUTEROL SULFATE (2.5 MG/3ML) 0.083% IN NEBU
2.5000 mg | INHALATION_SOLUTION | Freq: Four times a day (QID) | RESPIRATORY_TRACT | Status: DC | PRN
  Administered 2021-03-27 – 2021-03-28 (×3): 2.5 mg via RESPIRATORY_TRACT
  Filled 2021-03-24 (×3): qty 3

## 2021-03-24 MED ORDER — DIATRIZOATE MEGLUMINE & SODIUM 66-10 % PO SOLN
25.0000 mL | Freq: Once | ORAL | Status: AC
  Administered 2021-03-24: 13:00:00 25 mL
  Filled 2021-03-24: qty 30

## 2021-03-24 MED ORDER — LIDOCAINE VISCOUS HCL 2 % MT SOLN
10.0000 mL | Freq: Once | OROMUCOSAL | Status: AC
  Administered 2021-03-24: 14:00:00 10 mL via OROMUCOSAL
  Filled 2021-03-24: qty 15

## 2021-03-24 NOTE — Progress Notes (Addendum)
Unable to pass Cortrak past patient's hiatal hernia into fundus with fluoroscopic guidance. Discussed with Dr. Letta Pate. Spoke with patient, daughter and son-in-law at bedside. OK to give meds crushed PO. Explained situation with Cortrak and they enquired about percutaneous placement of gastrostomy tube.  We discussed some of the relative risks of this and the fact it stays in place for 6 weeks minimum. She telephoned patient's wife. They are in favor of this and wish to discuss further with Dr. Letta Pate in the morning.

## 2021-03-24 NOTE — Progress Notes (Signed)
Speech Language Pathology Daily Session Note  Patient Details  Name: David [name] MRN: [id number] Date of Birth: Dec 19, 1940  Today's Date: 03/24/2021 SLP Individual Time: 5462-7035 SLP Individual Time Calculation (min): 31 min  Short Term Goals: Week 3: SLP Short Term Goal 1 (Week 3): STG=LTG due to ELOS  Skilled Therapeutic Interventions: Skilled ST treatment focused on swallowing. Pt was accompanied by several family members this date for scheduled family education. Pt was sleeping on arrival and required moderate cueing to rouse. Pt remained lethargic with limited focused attention requiring max A verbal and light tactile cues throughout session to engage with therapist. Patient performed oral care using suction toothette and max hand-over-hand A. Oral mucosa and lips appeared dry. Chapstick was applied. Pt demonstrated appropriate alertness to consume 1 single ice chip by spoon requiring total hand-over-hand A for self feeding. Pt implemented chin tuck and secondary swallow with max A verbal cues and had no overt s/sx of aspiration but delayed throat clear evident. Pt required increased transit time and what appeared to be lingual pumping and mild anterior spillage on L. Pt appeared increasingly fatigued and trials were discontinued for safety. HOB left in upright position to facilitate pharyngeal clearance. Recommend continuation of NPO status and alternate means of nutrition. Initiate water protocol ICE CHIPS ONLY for pleasure, oral moisture, and to maintain pharyngeal swallow response. Communicated plan during care conference. Family was extensively educated on current NPO status and plans for initiating ice chips with emphasis on the following safe swallowing precautions and strategies:  Ensure pt is alert and sitting upright at 90 degrees Oral care to be completed prior to consumption of ice chips Provide one ice chip at a time  Cue patient to perform chin tuck and secondary  swallow Leave HOB elevated following trials Discontinue with increased s/s of aspiration.   Family verbalized understanding through teach back and returned demonstration effectively during session. SLP approved family to provide patient with occasional ice chips when present during visits with strict adherence to the above precautions. Will continue to follow to assess tolerance and progress pt to PO diet as clinically appropriate. Patient was left in bed with alarm activated and immediate needs within reach at end of session. Continue per current plan of care.        Pain    Therapy/Group: Individual Therapy  Patty Sermons 03/24/2021, 12:10 PM

## 2021-03-24 NOTE — Progress Notes (Signed)
Occupational Therapy Weekly Progress Note  Patient Details  Name: David [name] MRN: [id number] Date of Birth: 08-20-1940  Beginning of progress report period: March 17, 2021 End of progress report period: March 24, 2021  Today's Date: 03/24/2021 OT Individual Time: 6644-0347 OT Individual Time Calculation (min): 57 min    Patient has met 0 of 3 short term goals.  Pt with decline with progress with OT the past few days secondary to medical change.  Prior to change, pt was transferring at min to min guard assist level to the toilet and the walk-in shower.  He was able to complete UB bathing at supervision and UB dressing at min assist to supervision level.  LB bathing was min guard sit to stand in the shower with LB dressing at min to min guard as well.  Transfers were also at a min assist to min guard level depending on what surface he is trying to stand up from.  Currently, however due to medical changes, his initiation has slowed down as well as his ability to complete all bathing and dressing tasks.  He needs mod assist for bathing tasks sit to stand with max instructional cueing for sequencing.  All dressing was completed at max assist as well with mod to max for sit to stand when pulling items over his hips.  Transfers with the RW were also mod assist with short step length and mod instructional cueing for sequencing.  RUE functional use is still limited to a diminished level with min assist needed for pulling up garments on the left side with the LUE.  Decreased tip to tip pinch and thumb flexion is noted.  At this time, it is still uncertain what has caused the changes and decline in therapy, but feel he will benefit from continued CIR level OT to progress to the currently established contact guard assist level.    Patient continues to demonstrate the following deficits: muscle weakness and muscle paralysis, impaired timing and sequencing, unbalanced muscle activation, and decreased  coordination, decreased initiation, decreased attention, decreased awareness, decreased problem solving, decreased safety awareness, decreased memory, and delayed processing, and decreased standing balance, decreased postural control, hemiplegia, and decreased balance strategies and therefore will continue to benefit from skilled OT intervention to enhance overall performance with BADL and Reduce care partner burden.  Patient progressing toward long term goals..  Continue plan of care.  OT Short Term Goals Week 3:  OT Short Term Goal 1 (Week 3): Continue working on [facility] a contact guard assist overall.  Skilled Therapeutic Interventions/Progress Updates:    Pt in bed to start with family present.  He was asleep but able to awaken in order to try and participate in therapy.  He was able to transfer to the EOB with mod assist and maintain static sitting balance with min guard assist.  Worked on donning pants EOB with overall max assist sit to stand.  Increased time and facilitation needed to orient the pants as he would just sit and stare at them, barely moving the waist band before therapist helped to reposition them in his hands.  Assist needed to thread each LE with max assist to stand and pull them up over his hips.  Oxygen sats on room air at 92% with BP in sitting at 157/74 and then in standing at 127/73.  HR up into the 115s with activity as well.  He was able to complete sit to stand with mod assist from the EOB for  3 intervals and maintain standing for approximately 1-2 mins of time.  Oxygen sats remained at 92% or greater with standing.  Also had him complete stepping up the side of the bed with mod assist so that he would be higher up when transitioning to supine.  He transferred to supine at min assist level where he was left with family and nursing in the room.    Therapy Documentation Precautions:  Precautions Precautions: Fall, Other (comment) Precaution Comments: L  hemiparesis, cortrak placed Restrictions Weight Bearing Restrictions: No  Pain: Pain Assessment Pain Scale: Faces Pain Score: 0-No pain ADL: See Care Tool Section for some details of mobility and selfcare   Therapy/Group: Individual Therapy  Roderic Lammert OTR/L 03/24/2021, 12:54 PM

## 2021-03-24 NOTE — Progress Notes (Signed)
PROGRESS NOTE   Subjective/Complaints: Medical update discussed with pt, wife, 2 daughters and son in law Appreciate CT findings , unable to place NG tube last noc , cortrak to be placed  today, received rocephin rather than omnicef last noc due to po intake issues Discussed swallowing with SLP who feels his swallowing study looked fairly good for D1 honey, but unlikely to be able to consume adequate amounts safely due to fatigue   ROS-  Pt denies SOB, abd pain, CP, N/V/C/D, and vision changes,    Objective:   DG Chest 2 View  Result Date: 03/23/2021 CLINICAL DATA:  Cough EXAM: CHEST - 2 VIEW COMPARISON:  03/20/2021 FINDINGS: Large hiatal hernia. Heart is normal size. Patchy bilateral airspace disease, similar to prior study. No visible effusions or pneumothorax. No acute bony abnormality. NG tube appears to be in place with the tip in the mid cervical region. Recommend clinical correlation. IMPRESSION: There appears to be and NG tube in place with the tip in the mid cervical region. Recommend clinical correlation. Stable patchy bilateral airspace disease. Electronically Signed   By: Rolm Baptise M.D.   On: 03/23/2021 18:13   DG Abd 1 View  Result Date: 03/23/2021 CLINICAL DATA:  Nasogastric tube placement EXAM: ABDOMEN - 1 VIEW COMPARISON:  CT 06/07/2019 FINDINGS: Nasogastric tube tip appears positioned at the gastroesophageal junction, likely within the moderate hiatal hernia. Patchy pulmonary infiltrates are noted bilaterally. Normal abdominal gas pattern. IMPRESSION: Nasogastric tube tip position within the moderate hiatal hernia. Electronically Signed   By: Fidela Salisbury M.D.   On: 03/23/2021 21:24   DG Abd 1 View  Result Date: 03/23/2021 CLINICAL DATA:  NG placement. EXAM: ABDOMEN - 1 VIEW COMPARISON:  Earlier radiograph dated 03/23/2021. FINDINGS: An enteric tube is not visualized. Bilateral airspace opacities similar to prior  radiograph. No pleural effusion pneumothorax. Stable cardiac silhouette. Atherosclerotic calcification of the aorta. No acute osseous pathology. IMPRESSION: 1. Enteric tube is not visualized. 2. Bilateral airspace opacities similar to prior radiograph. Electronically Signed   By: Anner Crete M.D.   On: 03/23/2021 20:12   CT HEAD WO CONTRAST (5MM)  Result Date: 03/23/2021 CLINICAL DATA:  Stroke, follow up EXAM: CT HEAD WITHOUT CONTRAST TECHNIQUE: Contiguous axial images were obtained from the base of the skull through the vertex without intravenous contrast. RADIATION DOSE REDUCTION: This exam was performed according to the departmental dose-optimization program which includes automated exposure control, adjustment of the mA and/or kV according to patient size and/or use of iterative reconstruction technique. COMPARISON:  Recent CT and MR imaging FINDINGS: Brain: There is no acute intracranial hemorrhage, mass effect, or edema. Gray-white differentiation is preserved. Small areas of infarction on the prior brain MRI are not well seen. There is no extra-axial fluid collection. Ventricles and sulci are within normal limits in size and configuration. Vascular: There is atherosclerotic calcification at the skull base. Skull: Calvarium is unremarkable. Sinuses/Orbits: Paranasal sinus mucosal thickening throughout with air-fluid levels. Bilateral lens replacements. Other: None. IMPRESSION: No acute intracranial hemorrhage. The small infarcts on MRI are not well seen. Nonspecific paranasal sinus inflammatory changes. Acute sinusitis is possible in the appropriate setting. Electronically Signed  By: Macy Mis M.D.   On: 03/23/2021 18:25   Recent Labs    03/24/21 0540  WBC 9.0  HGB 11.8*  HCT 36.4*  PLT 334    Recent Labs    03/24/21 0540  NA 136  K 3.3*  CL 106  CO2 20*  GLUCOSE 90  BUN 7*  CREATININE 0.92  CALCIUM 10.0      Intake/Output Summary (Last 24 hours) at 03/24/2021 0827 Last  data filed at 03/24/2021 0400 Gross per 24 hour  Intake 820 ml  Output 725 ml  Net 95 ml         Physical Exam: Vital Signs Blood pressure (!) 166/90, pulse (!) 101, temperature 98.2 F (36.8 C), resp. rate 18, height 6' 0.01" (1.829 m), weight 92.1 kg, SpO2 97 %.  General: No acute distress Mood and affect are appropriate Heart: Regular rate and rhythm no rubs murmurs or extra sounds Lungs: Clear to auscultation, breathing unlabored, no rales or wheezes Abdomen: Positive bowel sounds, soft nontender to palpation, nondistended Extremities: No clubbing, cyanosis, or edema  Skin: No evidence of breakdown, no evidence of rash, loop site CDI, no hematoma palpated  Neurologic: Cranial nerves II through XII intact, motor strength is 4/5 in bilateral deltoid, bicep, tricep, grip, hip flexor, knee extensors, ankle dorsiflexor and plantar flexor unchanged  Musculoskeletal: Full range of motion in all 4 extremities. No joint swelling, left hip tenderness over greater troch , no pain with hip ROM    Assessment/Plan: 1. Functional deficits which require 3+ hours per day of interdisciplinary therapy in a comprehensive inpatient rehab setting. Physiatrist is providing close team supervision and 24 hour management of active medical problems listed below. Physiatrist and rehab team continue to assess barriers to discharge/monitor patient progress toward functional and medical goals  Care Tool:  Bathing  Bathing activity did not occur: Safety/medical concerns Body parts bathed by patient: Right arm, Left arm, Chest, Abdomen, Front perineal area, Buttocks, Right upper leg, Left upper leg, Right lower leg, Left lower leg, Face   Body parts bathed by helper: Right arm, Left arm, Chest, Buttocks, Right upper leg, Left upper leg, Right lower leg, Left lower leg, Face     Bathing assist Assist Level: Contact Guard/Touching assist     Upper Body Dressing/Undressing Upper body dressing Upper body  dressing/undressing activity did not occur (including orthotics): Safety/medical concerns What is the patient wearing?: Pull over shirt    Upper body assist Assist Level: Supervision/Verbal cueing    Lower Body Dressing/Undressing Lower body dressing    Lower body dressing activity did not occur: Safety/medical concerns What is the patient wearing?: Pants, Incontinence brief     Lower body assist Assist for lower body dressing: Minimal Assistance - Patient > 75%     Toileting Toileting    Toileting assist Assist for toileting: Minimal Assistance - Patient > 75%     Transfers Chair/bed transfer  Transfers assist  Chair/bed transfer activity did not occur: Safety/medical concerns  Chair/bed transfer assist level: Contact Guard/Touching assist Chair/bed transfer assistive device: Programmer, multimedia   Ambulation assist   Ambulation activity did not occur: Safety/medical concerns  Assist level: Contact Guard/Touching assist Assistive device: Walker-rolling Max distance: 135f   Walk 10 feet activity   Assist     Assist level: 2 helpers Assistive device: Hand held assist   Walk 50 feet activity   Assist Walk 50 feet with 2 turns activity did not occur: Safety/medical concerns  Walk 150 feet activity   Assist Walk 150 feet activity did not occur: Safety/medical concerns         Walk 10 feet on uneven surface  activity   Assist Walk 10 feet on uneven surfaces activity did not occur: Safety/medical concerns         Wheelchair     Assist Is the patient using a wheelchair?: Yes Type of Wheelchair: Manual    Wheelchair assist level: Dependent - Patient 0% Max wheelchair distance: 269f    Wheelchair 50 feet with 2 turns activity    Assist        Assist Level: Dependent - Patient 0%   Wheelchair 150 feet activity     Assist      Assist Level: Dependent - Patient 0%   Blood pressure (!) 166/90,  pulse (!) 101, temperature 98.2 F (36.8 C), resp. rate 18, height 6' 0.01" (1.829 m), weight 92.1 kg, SpO2 97 %.  Medical Problem List and Plan: 1. Functional deficits secondary to multifocal acute/early subacute cortical ischemia in the anterior right MCA territory, likely large vessel source from critical right ICA stenosis.  Status post right ICA stenting.  Left caudate small infarct>> procedure related versus occult atrial fibrillation.             -Patient with decline in cognition in terms of level of energy.  Suspect that nutrition decreased fluid intake is likely contributing, recent labs were reviewed negative CBC and c-Met unremarkable.   CT of the head no new infarct , + paranasal sinusitis              -ELOS/Goals: will need to extend stay due to medical issues Team conference today please see physician documentation under team conference tab, met with team  to discuss problems,progress, and goals. Formulized individual treatment plan based on medical history, underlying problem and comorbidities.   Continue CIR- PT, OT and SLP 15/7 schedule  May benefit from trial of neurostimulant discussed with family who agree  2.  Antithrombotics: -DVT/anticoagulation:  Pharmaceutical: Lovenox             -antiplatelet therapy: Aspirin, Brilinta 3. Pain Management:  Left hip pain , resolved  Left shoulder pain -no c/o today   4. Mood: LCSW to evaluate and provide emotional support             -antipsychotic agents: N/A 5. Neuropsych: This patient is capable of making decisions on his own behalf. 6. Skin/Wound Care: Routine skin care checks 7. Fluids/Electrolytes/Nutrition: hypoK + will supplement , give K rider IV x 2 , recheck BMET in am  8.  Bibasilar PNA- afebrile , leukocytosis resolved has persistent Xray abnormalities              -- resolved after course of Unasyn followed by Augmentin             9.  Frequent PVCs: Sees Dr. CStanford Breedas an outpatient  --recommends loop recorder>>  placed 1/10 10: Hypertension: monitor off Benicar  Vitals:   03/24/21 0445 03/24/21 0811  BP: (!) 166/90   Pulse: (!) 101   Resp: 18   Temp: 98.2 F (36.8 C)   SpO2: 95% 97%  Increase avapro to 1548mdaily  11: Hyperlipidemia: Zetia and Lipitor 12: EtOH use: advised to drink no more than 1 alcoholic beverage dialy 13: Overweight: BMI = 28.5. Rec: weight loss, diet and exercise as appropriate 14: CKD stage 3a: serum Cr = 0.99. Monitor At baseline  16: Hypokalemia: will need ongoing supplementation. Additional 22mq added 1/21.  17: Anemia: asymptomatic, mild stable  CBC Latest Ref Rng & Units 03/24/2021 03/20/2021 03/19/2021  WBC 4.0 - 10.5 K/uL 9.0 10.3 7.5  Hemoglobin 13.0 - 17.0 g/dL 11.8(L) 11.1(L) 10.1(L)  Hematocrit 39.0 - 52.0 % 36.4(L) 34.4(L) 31.6(L)  Platelets 150 - 400 K/uL 334 359 314  HGB remains stable , improving  18: Thyroid nodule: follow-up ultrasound in one year recommended 19. Dysphagia: continue D with nectars, aspiration precautions             -advance per SLP  Poor intake, pt dislikes modified diet, hopefully can upgrade soon per SLP Start Chocolate boost  RD consulted but no note yet     21. Eye irritation: eye drops added.  22. Lethargy: has been an ongoing issue , CT head showing no new infarct, but there is evidence of acute frontal and paranasal sinusitis, has been on Augmentin, will switch to cephalosporin, fluroquinolone another option although with more potential side effects  UA unremarkable except + nitrite , no symptoms of UTI (has been incont even PTA per wife )  CBC without leukocytosis 23. Transaminitis: Tylenol d/ced, repeat CMP 1/22 shows minimal elevation of AlkP and ALT, AST is normal    LOS: 15 days A FACE TO FACE EVALUATION WAS PERFORMED  ACharlett Blake1/25/2023, 8:27 AM

## 2021-03-24 NOTE — Progress Notes (Signed)
Physical Therapy Weekly Progress Note  Patient Details  Name: David [name] MRN: [id number] Date of Birth: 07/28/1940  Beginning of progress report period: March 18, 2020 End of progress report period: March 24, 2020  Today's Date: 03/24/2021 PT Individual Time: 1407-1500 PT Individual Time Calculation (min): 53 min   Patient has met 0 of 1 short term goals as none were set last week based on pt's ELOS.  David [name] has had a medical decline in the past 3 days with resultant decline in functional mobility level resulting in need for extension of CIR stay. He is now performing supine<>sit with heavy mod assist using bed features, sit<>stands using RW from elevated surface with max assist, and stand pivot transfers using RW with max assist. Pt has not performed gait training since 1/22. Pt now demonstrating slow initiation and delayed processing of commands as well as increased confusion. Pt will benefit from continued CIR level therapies to progress functional mobility level prior to D/Cing home with 24hr support from his family.  Patient continues to demonstrate the following deficits muscle weakness, decreased cardiorespiratoy endurance and decreased oxygen support, impaired timing and sequencing, unbalanced muscle activation, and decreased motor planning,  , decreased initiation, decreased attention, decreased awareness, decreased problem solving, decreased safety awareness, decreased memory, and delayed processing, and decreased sitting balance, decreased standing balance, decreased postural control, and decreased balance strategies and therefore will continue to benefit from skilled PT intervention to increase functional independence with mobility.  Patient not progressing toward long term goals.  See goal revision..  Plan of care revisions: MD note mentioned decreasing pt to 15/7 therapy schedule; however, after discussion as a team it was determined to keep pt at 5 out of 7 days with  minimum 1-2x/day, 45 to 90 minutes. Plan for extension of CIR stay with updated D/C date of Feb 2nd.   PT Short Term Goals Week 2:  PT Short Term Goal 1 (Week 2): = to LTGs based on ELOS PT Short Term Goal 1 - Progress (Week 2): Progressing toward goal Week 3:  PT Short Term Goal 1 (Week 3): = to LTGs based on ELOS  Skilled Therapeutic Interventions/Progress Updates:  Ambulation/gait training;Community reintegration;DME/adaptive equipment instruction;Neuromuscular re-education;Psychosocial support;Stair training;UE/LE Strength taining/ROM;Wheelchair propulsion/positioning;Balance/vestibular training;Discharge planning;Functional electrical stimulation;Pain management;Skin care/wound management;Therapeutic Activities;UE/LE Coordination activities;Cognitive remediation/compensation;Disease management/prevention;Functional mobility training;Patient/family education;Splinting/orthotics;Therapeutic Exercise;Visual/perceptual remediation/compensation   Pt received supine in bed, HOB elevated, with PA, nurse, and pt's daughter, David [name], and son-in-law, David [name] present. Pt with increased alertness this afternoon and is able to maintain eyes open and engage with therapist. Pt agreeable to therapy session. Pt received and maintained on 2L of O2 via nasal cannula.  Of note: Throughout session, pt demos impaired awareness with confusion and inappropriate responses to therapist's questions. For example: therapist asked pt what his family and PA had been discussing and he responded "taxi work" elaborating that they were discussing riding in a taxi going down the hill. Therapist attempted to reorient him to fact that they were discussing tube feedings; however, pt not demonstrating any recall or understanding of this. Later in session, when pt's friend was present pt stated that he thought his friend had left with a baby (but his friend did not leave and did not have anyone with him nor any object that could have been  misinterpreted as a baby)  Supine>sitting R EOB, HOB maximally elevated and using bedrails, with CGA for bringing trunk upright relying on bedrail but then heavy mod/max assist for scooting hips to EOB  using bed pads as pt able to initiate this movement but not enough force/power to move hips. Pt is slow to initiate movements requiring increased time for processing and execution of motor plan.   Pt able to maintain sitting balance EOB with feet supported and using UE support as needed (which was minimal to none) with supervision.   Of note, pt requires min assist progressed to increased time/effort to place L hand up onto RW in preparation for transfer.  Sit>stands from elevated EOB>RW x6 throughout session with max progressed to mod assist for rising to stand - continued to require increased time to initiate the task - noticed to have R posterior trunk lean requiring therapist facilitation for increased R hip extension and pelvic rotation with forward weight shift to achive midline. Pt requires increased time/effort to rise to stand, no knee instability noted.  Pt tolerated standing ~63mnute each bout with performance of standing marches and only light mod/min assist for balance. Attempted to progress to L stand pivot transfer to w/c; however, despite therapist facilitation for weight shifting to promote improved step pt with very slight movement of feet in shuffled pattern and became fatigued prior to being to able to turn requiring him to return to sitting on EOB for safety.  Vitals: SpO2 93% or greater throughout and HR 95-105bpm  Sit>supine, HOB partially elevated and relying heavily on bedrails, with significantly increased time and min assist. Pt left supine in bed, lines intact, needs in reach, HOB elevated >30 degrees, bed alarm on, and pt's friend present.   Therapy Documentation Precautions:  Precautions Precautions: Fall, Other (comment) Precaution Comments: L hemiparesis, cortrak  placed Restrictions Weight Bearing Restrictions: No   Pain: No reports of pain throughout session.   Therapy/Group: Individual Therapy  CTawana Scale, PT, DPT, NCS, CSRS  03/24/2021, 3:15 PM

## 2021-03-24 NOTE — Progress Notes (Signed)
Physical Therapy Session Note  Patient Details  Name: [name] MRN: [id number] Date of Birth: November 05, 1940  Today's Date: 03/24/2021 PT Individual Time: 6203-5597 PT Individual Time Calculation (min): 23 min   and  Today's Date: 03/24/2021 PT Missed Time: 37 Minutes Missed Time Reason: Other (Comment) (cortrak placement)  Short Term Goals: Week 2:  PT Short Term Goal 1 (Week 2): = to LTGs based on ELOS  Skilled Therapeutic Interventions/Progress Updates:  Pt received supine, asleep in bed with HOB elevated and his wife, Vickii Chafe, 2 daughters and 1 son-in-law present. Pt's family aware of pt's decline with functional mobility and has no questions for thearpist at this time. Pt very lethargic and only able to awaken for < 30 seconds at a time to engage with therapist after max verbal and light tactile stimulus; however, pt agreeable to progress to EOB with hopes of increased alertness. Pt received and maintained on 2L of O2 via nasal cannula throughout session. Vitals in supine: BP 155/82 (MAP 105), HR 86bpm, SpO2 97% Therapist setting up environment for safe mobility OOB when dietician arrived to place cortrak - family and therapist agreed this was the priority over mobility at this time therefore therapist left pt in care of nurse and dietician. Of note, pt continues to have frequent productive coughing - family reports pt now with increased difficulty using incentive spirometer. Missed 37 minutes of skilled physical therapy.   Therapy Documentation Precautions:  Precautions Precautions: Fall, Other (comment) Precaution Comments: L hemiparesis Restrictions Weight Bearing Restrictions: No   Pain: Very lethargic and no complaints or indications of pain but pt did appear cold therefore covered with more blankets.   Therapy/Group: Individual Therapy  Tawana Scale , PT, DPT, NCS, CSRS  03/24/2021, 7:57 AM

## 2021-03-24 NOTE — Progress Notes (Signed)
Patient ID: David [name], male   DOB: 1940/08/05, 81 y.o.   MRN: [id number]  Team Conference Report to Patient/Family  Team Conference discussion was reviewed with the patient and caregiver, including goals, any changes in plan of care and target discharge date.  Patient and caregiver express understanding and are in agreement.  The patient has a target discharge date of 04/01/21. SW met with patient and family. Family has been informed by physician of medical concerns. Patient d/c extended. Family confirmed that they have a wheelchair and RW, order cancelled through [employer]. No additional questions or concerns. Dyanne Iha 03/24/2021, 2:04 PM

## 2021-03-24 NOTE — Procedures (Signed)
Cortrak  Tube Type:  Cortrak - 43 inches Tube Location:  Left nare Secured by: Bridle Technique Used to Measure Tube Placement:  Marking at nare/corner of mouth Cortrak Secured At:  55 cm  RD unable to advance cortrak tube past pt's moderate sized hiatal hernia. Recommend fluoroscopy guided advancement of tube into the stomach.   Koleen Distance MS, RD, LDN Please refer to [facility] for RD and/or RD on-call/weekend/after hours pager

## 2021-03-24 NOTE — Patient Care Conference (Signed)
Inpatient RehabilitationTeam Conference and Plan of Care Update Date: 03/24/2021   Time: 10:32 AM    Patient Name: David [name]      Medical Record Number: [id number]  Date of Birth: 1940-03-24 Sex: Male         Room/Bed: 5C02C/5C02C-01 Payor Info: Payor: [employer] MEDICARE / Plan: [facility] MEDICARE / Product Type: *No Product type* /    Admit Date/Time:  03/29/2021  5:06 PM  Primary Diagnosis:  Acute right MCA stroke [facility])  Hospital Problems: Principal Problem:   Acute right MCA stroke [facility])    Expected Discharge Date: Expected Discharge Date: 04/01/21  Team Members Present: Physician leading conference: Dr. Alysia Penna Nurse Present: Dorien Chihuahua, RN PT Present: Page Spiro, PT OT Present: Clyda Greener, OT SLP Present: Sherren Kerns, SLP PPS Coordinator present : Gunnar Fusi, SLP     Current Status/Progress Goal Weekly Team Focus  Bowel/Bladder   pt is incont of bladder. cont. of bowel. Last BM recorded 1/20.  regain cont of bladder  assess q shift and PRN   Swallow/Nutrition/ Hydration   NPO  sup A for swallow safety, least restrictive diet  ice chip trials and possible water protocol, carryover of swallow strategies and pharyngeal exercises   ADL's   Pt completes UB selfcare at min assist level with min assist for LB bathing.  Max assist is needed for LB dressing sit to stand.  Min to mod assist for transfers to the tub seat and the [facility].  Slower cognitive processing with slower initiation and completion of ADLs compared to last week.  LUE still with limitations in pinch and grasp strength.  downgraded to min guard assist overall  selfcare retraining, transfer training, DME education, neuromuscular re-education, balance retraining, cognitive retraining, pt/family education   Mobility   change in status** up to [place] bed mobility, up to ModA/+2 max assist sit <> stand and transfers with RW, hasn't ambulated in past 2-3 days due to fatigue, delayed processing,  poor motor planning, has weaned down to 1-0.5L O2 with activity maintaining O2 >92%, last assess stairs at CGA/MinA 8 steps with B HR (likely not able to do right now).  downgraded to CGA (as of now would likely not meet CGA goals due to level of fatigue and change in status)  dc planning, fam ed, activity tolerance, transfers, balance, gait   Communication   mod-min A  mod I - will likely be downgraded 1/25  speech intelligibility   Safety/Cognition/ Behavioral Observations  max A  sup A - likley be downgraded 1/25  sustained attention, recall, awareness and problem solving   Pain   pt denies pain  pain<3  assess pain q shift and PRN   Skin   left chest OTA  Remain Clean dry and intact and free from infection  assess q shift and PRN     Discharge Planning:  discharging home with spouse, daughter and family to assist. 24/7 supervision avaliable   Team Discussion: Patient with functional decline over the past week; lethargic, poor initiation, poor po intake. CT noted acute sinusitis; started on abx. UA noted hint of UTI; on abx. Given IVF overnight for hydration. Continue to note congested, productive cough and recommendations for mod cues for chin tuck and swallow precautions.   Patient on target to meet rehab goals: Was doing well until decline noted within the past few days. Needs mod - max assist for bathing and dressing. Transfers now at mod assist. Left UE gross flexion/extension.   *  See Care Plan and progress notes for long and short-term goals.   Revisions to Treatment Plan:  MBS 03/23/21; NPO recommended; NGT unsuccessful, cortrak placement scheduled for 03/24/21. Ritalin trial H2O protocol with ice chips solo   Teaching Needs: Safety, medication management, nutritional means, transfers, toileting, etc  Current Barriers to Discharge: Decreased caregiver support, Home enviroment access/layout, Insurance for SNF coverage, and Nutritional means  Possible Resolutions to  Barriers: Family education HH follow up services recommended    Medical Summary Current Status: has developed increasing lethargy, sinusitis +/- UTI switched Abx, needs TF  Barriers to Discharge: Medical stability   Possible Resolutions to Barriers/Weekly Focus: TF hopefully short term, abx as above, ritalin trial   Continued Need for Acute Rehabilitation Level of Care: The patient requires daily medical management by a physician with specialized training in physical medicine and rehabilitation for the following reasons: Direction of a multidisciplinary physical rehabilitation program to maximize functional independence : Yes Medical management of patient stability for increased activity during participation in an intensive rehabilitation regime.: Yes Analysis of laboratory values and/or radiology reports with any subsequent need for medication adjustment and/or medical intervention. : Yes   I attest that I was present, lead the team conference, and concur with the assessment and plan of the team.   Dorien Chihuahua B 03/24/2021, 11:46 AM

## 2021-03-25 ENCOUNTER — Inpatient Hospital Stay: Payer: Self-pay

## 2021-03-25 ENCOUNTER — Inpatient Hospital Stay (HOSPITAL_COMMUNITY): Payer: Medicare Other

## 2021-03-25 LAB — CULTURE, BLOOD (ROUTINE X 2)
Culture: NO GROWTH
Culture: NO GROWTH

## 2021-03-25 LAB — BASIC METABOLIC PANEL
Anion gap: 9 (ref 5–15)
BUN: 8 mg/dL (ref 8–23)
CO2: 20 mmol/L — ABNORMAL LOW (ref 22–32)
Calcium: 10.1 mg/dL (ref 8.9–10.3)
Chloride: 110 mmol/L (ref 98–111)
Creatinine, Ser: 0.76 mg/dL (ref 0.61–1.24)
GFR, Estimated: >60 mL/min (ref >60)
Glucose, Bld: 82 mg/dL (ref 70–99)
Potassium: 3.9 mmol/L (ref 3.5–5.1)
Sodium: 139 mmol/L (ref 135–145)

## 2021-03-25 MED ORDER — DIATRIZOATE MEGLUMINE & SODIUM 66-10 % PO SOLN
30.0000 mL | Freq: Once | ORAL | Status: AC
  Administered 2021-03-25: 15 mL
  Filled 2021-03-25: qty 30

## 2021-03-25 MED ORDER — SODIUM CHLORIDE 0.9% FLUSH
10.0000 mL | INTRAVENOUS | Status: DC | PRN
  Administered 2021-03-27: 10 mL

## 2021-03-25 MED ORDER — IRBESARTAN 300 MG PO TABS
300.0000 mg | ORAL_TABLET | Freq: Every day | ORAL | Status: DC
  Administered 2021-03-26 – 2021-03-27 (×2): 300 mg via ORAL
  Filled 2021-03-25 (×3): qty 1

## 2021-03-25 MED ORDER — CHLORHEXIDINE GLUCONATE CLOTH 2 % EX PADS
6.0000 | MEDICATED_PAD | Freq: Every day | CUTANEOUS | Status: DC
  Administered 2021-03-26 – 2021-03-28 (×3): 6 via TOPICAL

## 2021-03-25 MED ORDER — SODIUM CHLORIDE 0.9% FLUSH
10.0000 mL | Freq: Two times a day (BID) | INTRAVENOUS | Status: DC
  Administered 2021-03-25 – 2021-03-27 (×2): 10 mL

## 2021-03-25 NOTE — Progress Notes (Addendum)
Speech Language Pathology Daily Session Note  Patient Details  Name: David [name] MRN: [id number] Date of Birth: Nov 09, 1940  Today's Date: 03/25/2021 SLP Individual Time: 2426-8341 SLP Individual Time Calculation (min): 50 min  Short Term Goals: Week 2: SLP Short Term Goal 2 (Week 2): STG=LTG due to ELOS  Skilled Therapeutic Interventions: Skilled ST treatment focused on swallowing and cognitive goals. Patient received supine in bed, in and out of sleep, and with family at bedside. Congested cough noted at baseline without presentation of PO. Cortrak unable to be placed successfully yesterday 2/2 impeding hiatal hernia. PA arrived during session to address this with family as well as answer questions pertaining to alternate means of nutrition.  Pt awakened to min verbal stimuli and was lethargic throughout session with difficulty keeping eyes open. Pt required mod-to-max A verbal and tactile cues for focused attention for ~30 second intervals. Pt with minimal verbal output and significantly delayed processing to all questions and commands. Pt required max and eventually total A for performing oral care using suction toothbrush prior to therapeutic PO trials. Trials were limited 2/2 fatigue. Pt accepted single ice chips by spoon with max-to-total A for feeding. Pt requiring mod A verbal and tactile cues to execute chin tuck, max A required for secondary swallow which was delayed and effortful. Pt with intermittent s/s of aspiration. Pt perked up a little with ice chips and agreeable to trial dysphagia 1 textures and honey thick liquids by spoon. Pt exhibited similar clinical presentation as ice chips, including delayed transit, lingual pumping, and min oral stasis. Pt with immediate cough x1 and no other s/sx of aspiration. Acknowledge pt at risk for silent aspiration per MBS. At this point patient began to show increasing signs of fatigue, oral phase appeared increasingly effortful, increased  pharyngeal delay per palpation, and pt now requiring max A to implement compensatory strategies and unable to initiate secondary swallow at times. Trials discontinued to provide brief break for pt to receive PO meds. At this time OT arrived and assisted pt to EOB. Nurse arrived and with crushed in applesauce. Pt with slightly improved alertness due to transition to EOB. Consumed meds with mod A cues to implement chin tuck and secondary swallow. No overt s/sx of aspiration observed, however patient appeared significantly fatigued and required significant amount of time to consume meds. Pt remains at significant risk for aspiration secondary to current oropharyngeal deficits which appear exacerbated by fatigue and cognitive deficits impacting alertness and ability to safely and consistently execute safe swallow compensatory strategies. Patient was left with OT and nurse at bedside. Cont POC.  Continue NPO status. Continue water protocol with ICE CHIPS ONLY; family approved to provide. Continue PO trials with SLP following diligent oral care. Meds given orally per MD/PA, crushed in applesauce until alternate means can be established via Cortrak or PEG.   Pain  None  Therapy/Group: Individual Therapy  Patty Sermons 03/25/2021, 3:27 PM

## 2021-03-25 NOTE — Progress Notes (Addendum)
IR paged for consultation regarding gastrostomy tube. Discussed case with IR team. They cannot place perc gastromy tube this week. Plan to attempt another nasogastric tube today under fluoroscopy.  Updated patient, wife and daughter at bedside and discussed with attendant RN.  IR unable to placed feeding tube either via NGT or percutaneous approach due to large Charco. I have consulted general surgery to review case.

## 2021-03-25 NOTE — Progress Notes (Signed)
Physical Therapy Session Note  Patient Details  Name: David [name] MRN: [id number] Date of Birth: 03-Dec-1940  Today's Date: 03/25/2021 PT Individual Time: 1307-1350 PT Individual Time Calculation (min): 43 min   and  Today's Date: 03/25/2021 PT Missed Time: 17 Minutes Missed Time Reason: Patient fatigue  Short Term Goals: Week 3:  PT Short Term Goal 1 (Week 3): = to LTGs based on ELOS  Skilled Therapeutic Interventions/Progress Updates:    Pt received supine in bed, HOB elevated, asleep with the nasal cannula up around his eyes. Pt easily awakens and agreeable to therapy session. Therapist repositioned nasal cannula and SpO2 92% and HR 95bpm - additional vitals below. Supine>sitting R EOB, HOB elevated 40 degrees and relying on bedrails, with light mod assist for trunk upright - continues to require heavy mod assist to scoot hips towards EOB with cuing for head/hips relationship. Pt continues to demo slow initiation with delayed processing and delayed motor response to therapist's cuing requiring repeated cuing - this seems even slightly more impaired compared to yesterday. Also, pt with even less verbal response to thearpist's comments and questions throughout session. Pt continues to have frequent coughing throughout session. Vitals assessed throughout as noted below. Pt noted to be incontinent of bladder with pants and bed soiled with pt unaware.   Sit>stand x5 reps during session from elevated EOB to RW with heavy mod assist for rising to stand - for safety, had +2 assist present to manage LB clothing and perform peri-care total assist. Pt continues to have poor standing endurance requiring him to return to sitting after <1 minute - able to maintain standing balance using B UE support on RW with min assist - noticed BLE muscle fatigued today with shaking in LEs but no knee instability. With encouragement, pt agreeable to attempt continent void and was successful with therapist managing  urinal total assist. R side stepping at EOB using RW with min assist for balance.   Sit>R sidelying in bed, reverse logroll technique, with mod assist for trunk descent and B LE management into bed due to poor motor planning as this is not pt's typical movement pattern. Pt reports he is very comfortable lying on his side and requests to remain in this position - pt quickly starting to fall asleep. Left with needs in reach, pillows therapeutically positioned for pressure relief, bed alarm on, and lines intact. Missed 17 minutes of skilled physical therapy.  Vitals - on 1L of O2 via nasal cannula: Sitting EOB: SpO2 92%, HR 95bpm After standing peri-care: SpO2 93%, HR 120bpm recovering to 110bpm with seated break in ~71minute After side stepping towards EOB: SpO2 95%, HR 134bpm decreasing to 117bpm then 110bpm with seated break At end of session, while resting in sidelying: SpO2 97%%, HR 96bpm  Therapy Documentation Precautions:  Precautions Precautions: Fall, Other (comment) Precaution Comments: L hemiparesis, cortrak placed Restrictions Weight Bearing Restrictions: No   Pain:  No reports or indications of pain throughout session.   Therapy/Group: Individual Therapy  Tawana Scale , PT, DPT, NCS, CSRS  03/25/2021, 12:47 PM

## 2021-03-25 NOTE — Progress Notes (Signed)
Occupational Therapy Session Note  Patient Details  Name: David [name] MRN: [id number] Date of Birth: Feb 23, 1941  Today's Date: 03/25/2021 OT Individual Time: 6812-7517 OT Individual Time Calculation (min): 27 min    Short Term Goals: Week 3:  OT Short Term Goal 1 (Week 3): Continue working on [facility] a contact guard assist overall.  Skilled Therapeutic Interventions/Progress Updates:    Pt greeted semi-reclined in bed with nursing and SLP present. SLP wanted to see how patients swallowing went with medications in applesauce. OT assisted pt to sitting EOB with max A. Per spouse, this is the most awake she has seen him all day. Pt needed min A overall for sitting balance, but was able to tolerate sitting for ~ 20 minutes while getting meds. Pt needed moderate cuing for chin tuck with each swallow. OT tried to engage pt in performing his own oral care using suction toothbrush, but pt too fatigued to remove unilateral UE from bed for sitting support, requiring max A from OT. Pt needed max A  to return to bed. Pt left semi-reclined in bed with HOB above 30 degrees, call bell in reach, and family present.   Therapy Documentation Precautions:  Precautions Precautions: Fall, Other (comment) Precaution Comments: L hemiparesis, cortrak placed Restrictions Weight Bearing Restrictions: No Pain: Pain Assessment Pain Score: 0-No pain  Therapy/Group: Individual Therapy  Valma Cava 03/25/2021, 11:05 AM

## 2021-03-25 NOTE — Progress Notes (Signed)
Peripherally Inserted Central Catheter Placement  The IV Nurse has discussed with the patient and/or persons authorized to consent for the patient, the purpose of this procedure and the potential benefits and risks involved with this procedure.  The benefits include less needle sticks, lab draws from the catheter, and the patient may be discharged home with the catheter. Risks include, but not limited to, infection, bleeding, blood clot (thrombus formation), and puncture of an artery; nerve damage and irregular heartbeat and possibility to perform a PICC exchange if needed/ordered by physician.  Alternatives to this procedure were also discussed.  Bard Power PICC patient education guide, fact sheet on infection prevention and patient information card has been provided to patient /or left at bedside.    PICC Placement Documentation  PICC Double Lumen 43/88/87 PICC Right Basilic 39 cm 0 cm (Active)  Indication for Insertion or Continuance of Line Administration of hyperosmolar/irritating solutions (i.e. TPN, Vancomycin, etc.) 03/25/21 2116  Exposed Catheter (cm) 0 cm 03/25/21 2116  Site Assessment Clean;Dry;Intact 03/25/21 2116  Lumen #1 Status Flushed;Blood return noted;Saline locked 03/25/21 2116  Lumen #2 Status Flushed;Blood return noted;Saline locked 03/25/21 2116  Dressing Type Transparent 03/25/21 2116  Dressing Status Clean;Dry;Intact 03/25/21 2116  Antimicrobial disc in place? Yes 03/25/21 2116  Safety Lock Not Applicable 57/97/28 2060  Line Care Connections checked and tightened 03/25/21 2116  Line Adjustment (NICU/IV Team Only) No 03/25/21 2116  Dressing Intervention New dressing 03/25/21 2116  Dressing Change Due 04/01/21 03/25/21 2116       Si Jachim, Cathlyn Parsons 03/25/2021, 9:18 PM

## 2021-03-25 NOTE — Progress Notes (Signed)
I evaluated the patient at the bedside.  He appears fatigued as he did this morning but is alert and oriented.  His daughter and son-in-law are at bedside.  I spoke with general surgery physician assistant and he reviewed the case with Dr. Donne Hazel.  I contacted interventional radiology and Ascencion Dike, PA-C responded in regards to Brilinta and aspirin therapy.  Brilinta may be held for procedures, but recommends continuing aspirin.  The patient is on Lovenox for DVT prophylaxis.  As per general surgery recommendations, CT scan of the abdomen and pelvis to assess hiatal hernia and abdominal anatomy has been ordered.  I discussed in detail these procedures as well as timing of possible feeding tube placement.  This could be as late as Monday.  The family is urging moving towards parenteral nutrition.  We discussed placement of PICC line in order to initiate TPN.  I did discuss with them that moving forward with these interventions would be more tiring and uncomfortable for Mr. Dafoe.  Also explained risk of infection with central line placement, multiple lab draws and the need for CT scanning.  They wish to proceed.  I reviewed all the above with Dr. Ranell Patrick.

## 2021-03-25 NOTE — Progress Notes (Signed)
PROGRESS NOTE   Subjective/Complaints: Family would like to proceed with PEG Patient is too lethargic to respond today but expressed his agreement to PEG yesterday.  His wife asks about his current medications.    ROS-  Unable to obtain due to lethargy.    Objective:   DG Chest 2 View  Result Date: 03/23/2021 CLINICAL DATA:  Cough EXAM: CHEST - 2 VIEW COMPARISON:  03/20/2021 FINDINGS: Large hiatal hernia. Heart is normal size. Patchy bilateral airspace disease, similar to prior study. No visible effusions or pneumothorax. No acute bony abnormality. NG tube appears to be in place with the tip in the mid cervical region. Recommend clinical correlation. IMPRESSION: There appears to be and NG tube in place with the tip in the mid cervical region. Recommend clinical correlation. Stable patchy bilateral airspace disease. Electronically Signed   By: Rolm Baptise M.D.   On: 03/23/2021 18:13   DG Abd 1 View  Result Date: 03/23/2021 CLINICAL DATA:  Nasogastric tube placement EXAM: ABDOMEN - 1 VIEW COMPARISON:  CT 06/07/2019 FINDINGS: Nasogastric tube tip appears positioned at the gastroesophageal junction, likely within the moderate hiatal hernia. Patchy pulmonary infiltrates are noted bilaterally. Normal abdominal gas pattern. IMPRESSION: Nasogastric tube tip position within the moderate hiatal hernia. Electronically Signed   By: Fidela Salisbury M.D.   On: 03/23/2021 21:24   DG Abd 1 View  Result Date: 03/23/2021 CLINICAL DATA:  NG placement. EXAM: ABDOMEN - 1 VIEW COMPARISON:  Earlier radiograph dated 03/23/2021. FINDINGS: An enteric tube is not visualized. Bilateral airspace opacities similar to prior radiograph. No pleural effusion pneumothorax. Stable cardiac silhouette. Atherosclerotic calcification of the aorta. No acute osseous pathology. IMPRESSION: 1. Enteric tube is not visualized. 2. Bilateral airspace opacities similar to prior  radiograph. Electronically Signed   By: Anner Crete M.D.   On: 03/23/2021 20:12   CT HEAD WO CONTRAST (5MM)  Result Date: 03/23/2021 CLINICAL DATA:  Stroke, follow up EXAM: CT HEAD WITHOUT CONTRAST TECHNIQUE: Contiguous axial images were obtained from the base of the skull through the vertex without intravenous contrast. RADIATION DOSE REDUCTION: This exam was performed according to the departmental dose-optimization program which includes automated exposure control, adjustment of the mA and/or kV according to patient size and/or use of iterative reconstruction technique. COMPARISON:  Recent CT and MR imaging FINDINGS: Brain: There is no acute intracranial hemorrhage, mass effect, or edema. Gray-white differentiation is preserved. Small areas of infarction on the prior brain MRI are not well seen. There is no extra-axial fluid collection. Ventricles and sulci are within normal limits in size and configuration. Vascular: There is atherosclerotic calcification at the skull base. Skull: Calvarium is unremarkable. Sinuses/Orbits: Paranasal sinus mucosal thickening throughout with air-fluid levels. Bilateral lens replacements. Other: None. IMPRESSION: No acute intracranial hemorrhage. The small infarcts on MRI are not well seen. Nonspecific paranasal sinus inflammatory changes. Acute sinusitis is possible in the appropriate setting. Electronically Signed   By: Macy Mis M.D.   On: 03/23/2021 18:25   DG Fluoro Rm 1-60 Min  Result Date: 03/24/2021 CLINICAL DATA:  Feeding tube adjustment EXAM: DG C-ARM 1-60 MIN CONTRAST:  25 mL Gastrografin FLUOROSCOPY TIME:  Fluoroscopy Time:  4  minutes Radiation Exposure Index (if provided by the fluoroscopic device): 35.2 mGy COMPARISON:  None. FINDINGS: Adjustment of the feeding tube was attempted using fluoroscopic guidance. Tube and wire consistently directed superiorly into the hiatal hernia rather than through the esophageal hiatus. Administration of small volume  of contrast filled the hiatal hernia. IMPRESSION: Technically unsuccessful fluoroscopic guided feeding tube adjustment. Electronically Signed   By: Macy Mis M.D.   On: 03/24/2021 13:48   DG Abd Portable 1V  Result Date: 03/24/2021 CLINICAL DATA:  NG tube placement. EXAM: PORTABLE ABDOMEN - 1 VIEW COMPARISON:  January 24th of 2023. FINDINGS: Patchy bilateral airspace disease is incidentally noted. A feeding tube has been placed, gastric tube has been removed. Feeding tube tip in the area of a hiatal hernia projecting in the midline just above the expected location of the esophageal hiatus. Cardiac loop recorder over the LEFT inferior chest as before. Distension of colonic loops in the abdomen with stool mixed with ingested contrast media in the colon. IMPRESSION: 1. Feeding tube terminating in the area of hiatal hernia. Tip just above expected location of the esophageal hiatus. 2. Distension of the colon with stool mixed with ingested contrast media in the colon. Findings are not changed compared to previous imaging. 3. Persistent patchy bilateral airspace disease. Electronically Signed   By: Zetta Bills M.D.   On: 03/24/2021 11:13   [place] EKG SITE RITE  Result Date: 03/25/2021 If Site Rite image not attached, placement could not be confirmed due to current cardiac rhythm.  Recent Labs    03/24/21 0540  WBC 9.0  HGB 11.8*  HCT 36.4*  PLT 334   Recent Labs    03/24/21 0540 03/25/21 0529  NA 136 139  K 3.3* 3.9  CL 106 110  CO2 20* 20*  GLUCOSE 90 82  BUN 7* 8  CREATININE 0.92 0.76  CALCIUM 10.0 10.1     Intake/Output Summary (Last 24 hours) at 03/25/2021 1721 Last data filed at 03/25/2021 1500 Gross per 24 hour  Intake 1801.67 ml  Output 250 ml  Net 1551.67 ml        Physical Exam: Vital Signs Blood pressure (!) 170/79, pulse 94, temperature 98.2 F (36.8 C), resp. rate 20, height 6' (1.829 m), weight 85.1 kg, SpO2 96 %.  Gen: no distress, normal appearing HEENT:  oral mucosa pink and moist, NCAT Cardio: Reg rate Chest: normal effort, normal rate of breathing Abd: soft, non-distended Ext: no edema Psych: Lethargic Skin: intact Neuro: Musculoskeletal:   Skin: No evidence of breakdown, no evidence of rash, loop site CDI, no hematoma palpated  Neurologic: Too lethargic to participate in exam.    Assessment/Plan: 1. Functional deficits which require 3+ hours per day of interdisciplinary therapy in a comprehensive inpatient rehab setting. Physiatrist is providing close team supervision and 24 hour management of active medical problems listed below. Physiatrist and rehab team continue to assess barriers to discharge/monitor patient progress toward functional and medical goals  Care Tool:  Bathing  Bathing activity did not occur: Safety/medical concerns Body parts bathed by patient: Right arm, Left arm, Chest, Abdomen, Front perineal area, Buttocks, Right upper leg, Left upper leg, Right lower leg, Left lower leg, Face   Body parts bathed by helper: Right arm, Left arm, Chest, Buttocks, Right upper leg, Left upper leg, Right lower leg, Left lower leg, Face     Bathing assist Assist Level: Contact Guard/Touching assist     Upper Body Dressing/Undressing Upper body dressing Upper body dressing/undressing  activity did not occur (including orthotics): Safety/medical concerns What is the patient wearing?: Pull over shirt    Upper body assist Assist Level: Supervision/Verbal cueing    Lower Body Dressing/Undressing Lower body dressing    Lower body dressing activity did not occur: Safety/medical concerns What is the patient wearing?: Pants     Lower body assist Assist for lower body dressing: Maximal Assistance - Patient 25 - 49%     Toileting Toileting    Toileting assist Assist for toileting: Minimal Assistance - Patient > 75%     Transfers Chair/bed transfer  Transfers assist  Chair/bed transfer activity did not occur:  Safety/medical concerns (unable due to fatigue)  Chair/bed transfer assist level: Contact Guard/Touching assist Chair/bed transfer assistive device: Programmer, multimedia   Ambulation assist   Ambulation activity did not occur: Safety/medical concerns  Assist level: Contact Guard/Touching assist Assistive device: Walker-rolling Max distance: 146f   Walk 10 feet activity   Assist     Assist level: 2 helpers Assistive device: Hand held assist   Walk 50 feet activity   Assist Walk 50 feet with 2 turns activity did not occur: Safety/medical concerns         Walk 150 feet activity   Assist Walk 150 feet activity did not occur: Safety/medical concerns         Walk 10 feet on uneven surface  activity   Assist Walk 10 feet on uneven surfaces activity did not occur: Safety/medical concerns         Wheelchair     Assist Is the patient using a wheelchair?: Yes Type of Wheelchair: Manual    Wheelchair assist level: Dependent - Patient 0% Max wheelchair distance: 2016f   Wheelchair 50 feet with 2 turns activity    Assist        Assist Level: Dependent - Patient 0%   Wheelchair 150 feet activity     Assist      Assist Level: Dependent - Patient 0%   Blood pressure (!) 170/79, pulse 94, temperature 98.2 F (36.8 C), resp. rate 20, height 6' (1.829 m), weight 85.1 kg, SpO2 96 %.  Medical Problem List and Plan: 1. Functional deficits secondary to multifocal acute/early subacute cortical ischemia in the anterior right MCA territory, likely large vessel source from critical right ICA stenosis.  Status post right ICA stenting.  Left caudate small infarct>> procedure related versus occult atrial fibrillation.             -Patient with decline in cognition in terms of level of energy.  Suspect that nutrition decreased fluid intake is likely contributing, recent labs were reviewed negative CBC and c-Met unremarkable.   CT of the head  no new infarct , + paranasal sinusitis   Continue CIR             -ELOS/Goals: will need to extend stay due to medical issues Team conference today please see physician documentation under team conference tab, met with team  to discuss problems,progress, and goals. Formulized individual treatment plan based on medical history, underlying problem and comorbidities.   Continue CIR- PT, OT and SLP 15/7 schedule  May benefit from trial of neurostimulant discussed with family who agree  2.  Impaired mobility -DVT/anticoagulation:  Pharmaceutical: Lovenox             -antiplatelet therapy: continue Aspirin, hold Brilinta prior to PEG 3. Pain Management:  Left hip pain , resolved  Left shoulder pain -no c/o today  4. Mood: LCSW to evaluate and provide emotional support             -antipsychotic agents: N/A 5. Neuropsych: This patient is capable of making decisions on his own behalf. 6. Skin/Wound Care: Routine skin care checks 7. Fluids/Electrolytes/Nutrition: hypoK + will supplement , give K rider IV x 2 , recheck BMET in am  8.  Bibasilar PNA- afebrile , leukocytosis resolved has persistent Xray abnormalities              -- resolved after course of Unasyn followed by Augmentin             9.  Frequent PVCs: Sees Dr. Stanford Breed as an outpatient  --recommends loop recorder>> placed 1/10 10: Hypertension: monitor off Benicar  Vitals:   03/25/21 0414 03/25/21 1253  BP: (!) 144/98 (!) 170/79  Pulse: 81 94  Resp: 18 20  Temp: 99.5 F (37.5 C) 98.2 F (36.8 C)  SpO2: 97% 96%  Increase Avapro to 316m.  11: Hyperlipidemia: Zetia and Lipitor 12: EtOH use: advised to drink no more than 1 alcoholic beverage dialy 13: Overweight: BMI = 28.5. Rec: weight loss, diet and exercise as appropriate 14: CKD stage 3a: serum Cr = 0.99. Monitor At baseline  16: Hypokalemia: will need ongoing supplementation. Additional 456m added 1/21.  17: Anemia: asymptomatic, mild stable  CBC Latest Ref Rng & Units  03/24/2021 03/20/2021 03/19/2021  WBC 4.0 - 10.5 K/uL 9.0 10.3 7.5  Hemoglobin 13.0 - 17.0 g/dL 11.8(L) 11.1(L) 10.1(L)  Hematocrit 39.0 - 52.0 % 36.4(L) 34.4(L) 31.6(L)  Platelets 150 - 400 K/uL 334 359 314  HGB remains stable , improving  18: Thyroid nodule: follow-up ultrasound in one year recommended 19. Dysphagia: continue D with nectars, aspiration precautions             -advance per SLP  TPN via PICC.   Plan for PEG Monday after CT abdomen 21. Eye irritation: eye drops added.  22. Lethargy: has been an ongoing issue , CT head showing no new infarct, but there is evidence of acute frontal and paranasal sinusitis, has been on Augmentin, will switch to cephalosporin, fluroquinolone another option although with more potential side effects  UA unremarkable except + nitrite , no symptoms of UTI (has been incont even PTA per wife )  CBC without leukocytosis 23. Transaminitis: Tylenol d/ced, repeat CMP 1/22 shows minimal elevation of AlkP and ALT, AST is normal     LOS: 16 days A FACE TO FACE EVALUATION WAS PERFORMED  KrClide Deutscheraulkar 03/25/2021, 5:21 PM

## 2021-03-25 NOTE — Progress Notes (Signed)
Occupational Therapy Session Note  Patient Details  Name: David [name] MRN: [id number] Date of Birth: December 05, 1940  Today's Date: 03/25/2021 OT Individual Time: 9987-2158 OT Individual Time Calculation (min): 35 min    Short Term Goals: Week 2:  OT Short Term Goal 1 (Week 2): Pt will complete UB dressing at supervision level sitting unsupported. OT Short Term Goal 1 - Progress (Week 2): Not met OT Short Term Goal 2 (Week 2): Pt will complete LB dressing with min assist sit to stand for all aspects. OT Short Term Goal 2 - Progress (Week 2): Not met OT Short Term Goal 3 (Week 2): Pt will complete two grooming tasks in standing at the sink with min guard assist for at least 3 mins. OT Short Term Goal 3 - Progress (Week 2): Not met Week 3:  OT Short Term Goal 1 (Week 3): Continue working on [facility] a contact guard assist overall.  Skilled Therapeutic Interventions/Progress Updates:  Patient met lying supine in bed. Several family members present at bedside. Family reports a 3 day worsening decline in function. No s/s of pain at rest or with activity. Patient very lethargic and minimally verbal keeping eyes closed for 98% of treatment session. Changes made to the environment to improve alertness. Noted nasal cannula off. SpO2 91-92% on RA. Reapplied nasal cannula with SpO2 improving to 94-95% with cues to breathe through nose. Patient found to be incontinent of bladder requiring Max to Total A for hygiene/clothing management in supine with patient able to bridge in supine x2 trials while this writer assisted with hiking pants over hips. Follows 1-step verbal commands with increased time and repeat cues. Active movement of all 4 extremities noted. Max A to wash face at bedlevel. Patient missed 25 minutes of skilled OT treatment session 2/2 lethargy. Session concluded with bed in chair position with call bell within reach, bed alarm activated and all needs met. Family remained present at  bedside.   Therapy Documentation Precautions:  Precautions Precautions: Fall, Other (comment) Precaution Comments: L hemiparesis, cortrak placed Restrictions Weight Bearing Restrictions: No General: General OT Amount of Missed Time: 25 Minutes  Therapy/Group: Individual Therapy  Allyna Pittsley R Howerton-Davis 03/25/2021, 8:39 AM

## 2021-03-26 ENCOUNTER — Encounter (HOSPITAL_COMMUNITY): Payer: Self-pay | Admitting: Physical Medicine & Rehabilitation

## 2021-03-26 ENCOUNTER — Inpatient Hospital Stay (HOSPITAL_COMMUNITY): Payer: Medicare Other

## 2021-03-26 DIAGNOSIS — Z515 Encounter for palliative care: Secondary | ICD-10-CM

## 2021-03-26 DIAGNOSIS — E43 Unspecified severe protein-calorie malnutrition: Secondary | ICD-10-CM | POA: Insufficient documentation

## 2021-03-26 DIAGNOSIS — Z7189 Other specified counseling: Secondary | ICD-10-CM

## 2021-03-26 LAB — GLUCOSE, CAPILLARY: Glucose-Capillary: 86 mg/dL (ref 70–99)

## 2021-03-26 MED ORDER — SODIUM CHLORIDE 0.45 % IV SOLN: INTRAVENOUS | Status: AC

## 2021-03-26 MED ORDER — METOPROLOL TARTRATE 12.5 MG HALF TABLET
12.5000 mg | ORAL_TABLET | Freq: Two times a day (BID) | ORAL | Status: DC
  Administered 2021-03-26 – 2021-03-27 (×3): 12.5 mg via ORAL
  Filled 2021-03-26 (×4): qty 1

## 2021-03-26 MED ORDER — TRAVASOL 10 % IV SOLN
INTRAVENOUS | Status: AC
  Filled 2021-03-26: qty 528

## 2021-03-26 MED ORDER — INSULIN ASPART 100 UNIT/ML IJ SOLN
0.0000 [IU] | Freq: Four times a day (QID) | INTRAMUSCULAR | Status: DC
  Administered 2021-03-27 (×3): 2 [IU] via SUBCUTANEOUS
  Administered 2021-03-27: 3 [IU] via SUBCUTANEOUS
  Administered 2021-03-28: 2 [IU] via SUBCUTANEOUS

## 2021-03-26 MED ORDER — IOHEXOL 350 MG/ML SOLN
88.0000 mL | Freq: Once | INTRAVENOUS | Status: AC | PRN
  Administered 2021-03-26: 88 mL via INTRAVENOUS

## 2021-03-26 NOTE — Progress Notes (Signed)
Nutrition Follow-up  DOCUMENTATION CODES:   Severe malnutrition in context of acute illness/injury  INTERVENTION:   TPN management per Pharmacy Recommend adding a suppository due to no BM x 7 days. Messaged the PA.  Tube Feed once G-tube is placed: Start Osmolite 1.5 @ 25 mL and advance by 10 mL q8h until goal of 55 mL/hr is reached (1320 mL/hr) 45 mL ProSource TF - TID 200 mL free water flushes q4h Provides 2100 kcal, 116 gm PRO, 1006 mL free water (2206 mL total free water) daily  NUTRITION DIAGNOSIS:   Severe Malnutrition related to acute illness as evidenced by severe muscle depletion, moderate fat depletion, percent weight loss.   GOAL:   Patient will meet greater than or equal to 90% of their needs - Ongoing  MONITOR:   I & O's, Weight trends, Labs  REASON FOR ASSESSMENT:   Consult Assessment of nutrition requirement/status, Calorie Count, Enteral/tube feeding initiation and management  ASSESSMENT:   81 y.o. male admitted to rehab post MCA stroke, requiring an ICA stent. PMH includes HTN, prostate cancer, CAD, and GERD.   Cortrak unsuccessful on Wednesday. Fluoroscopy attempted x2, both attempts unsuccessful. Plan for PEG on Monday.   Plan to initiate TPN today until able to transition to enteral nutrition.    Discussed with family what TPN is and the use of it. Reviewed that we will transition to enteral feedings after PEG is placed. Then work towards bolus/nocturnal feeds once tolerating tube feeds.   Answered any family questions regarding TF or TPN. Family very appreciative of nutrition support.   Pt with an 8% weight loss within 2 weeks, clinically significant for time frame.   Pt noted to not have a bowel movement in 7 days. Abd x-ray done 2 days ago noted stool within the colon.   Medications reviewed and include: Omnicef, SSI 0-15 units q6h, Megace, Protonix, Potasium Chloride Labs reviewed.  NUTRITION - FOCUSED PHYSICAL EXAM:  Flowsheet Row Most  Recent Value  Orbital Region Moderate depletion  Upper Arm Region Moderate depletion  Thoracic and Lumbar Region Mild depletion  Buccal Region Moderate depletion  Temple Region Moderate depletion  Clavicle Bone Region Severe depletion  Clavicle and Acromion Bone Region Severe depletion  Scapular Bone Region Severe depletion  Dorsal Hand Severe depletion  Patellar Region Moderate depletion  Anterior Thigh Region Moderate depletion  Posterior Calf Region Moderate depletion  Edema (RD Assessment) None  Hair Reviewed  Eyes Unable to assess  Mouth Reviewed  Skin Reviewed  Nails Reviewed       Diet Order:   Diet Order             Diet NPO time specified Except for: Ice Chips, Other (See Comments)  Diet effective now                   EDUCATION NEEDS:   Not appropriate for education at this time  Skin:  Skin Assessment: Reviewed RN Assessment  Last BM:  1/20  Height:   Ht Readings from Last 1 Encounters:  03/25/21 6' (1.829 m)    Weight:   Wt Readings from Last 1 Encounters:  03/25/21 85.1 kg    Ideal Body Weight:  80.9 kg  BMI:  Body mass index is 25.44 kg/m.  Estimated Nutritional Needs:   Kcal:  2100-2300  Protein:  105-120 grams  Fluid:  >/= 2.1 L    Zanna Hawn Louie Casa, RD, LDN Clinical Dietitian See [facility] for contact information.

## 2021-03-26 NOTE — Progress Notes (Addendum)
PROGRESS NOTE   Subjective/Complaints: Discussed that likely earliest PEG placement would be Monday pending CT abdomen results, he is going down for this today Continues to be lethargic  ROS-  Unable to obtain due to lethargy.    Objective:   DG Fluoro Rm 1-60 Min  Result Date: 03/26/2021 CLINICAL DATA:  Feeding difficulties, weak swallow, concern for aspiration, NPO status EXAM: DG C-ARM 1-60 MIN CONTRAST:  10cc water soluble contrast FLUOROSCOPY TIME:  Fluoroscopy Time:  1 minute and 48 seconds Radiation Exposure Index (if provided by the fluoroscopic device): 9.6 mGy Number of Acquired Spot Images: 0 COMPARISON:  Attempt from 03/24/21, plain films abd. FINDINGS: The left naris was prepped with viscous lidocaine jelly and a 10 [place] Dobbhoff style catheter was inserted. Using fluoroscopic guidance, the tube was advanced into the intrathoracic stomach. Despite multiple attempts with guidewire, contrast medium, and repositioning of table and patient, access to the intra-abdominal portion of the stomach was not achieved. Attempt was aborted, tube removed, and examination ended. IMPRESSION: Unsuccessful second attempt of fluoroscopic guided feeding tube placement. Consider endoscopic placement of short-term NG/ND tube or surgical placement of G-tube/J-tube. Anatomy not amenable to percutaneous G-tube placement. Findings communicated to ordering provider Risa Grill, PA-C, at time of exam. Procedure performed by Pasty Spillers PA, supervised by Abigail Miyamoto, M.D. Electronically Signed   By: Abigail Miyamoto M.D.   On: 03/26/2021 08:24   DG Fluoro Rm 1-60 Min  Result Date: 03/24/2021 CLINICAL DATA:  Feeding tube adjustment EXAM: DG C-ARM 1-60 MIN CONTRAST:  25 mL Gastrografin FLUOROSCOPY TIME:  Fluoroscopy Time:  4 minutes Radiation Exposure Index (if provided by the fluoroscopic device): 35.2 mGy COMPARISON:  None. FINDINGS: Adjustment of the  feeding tube was attempted using fluoroscopic guidance. Tube and wire consistently directed superiorly into the hiatal hernia rather than through the esophageal hiatus. Administration of small volume of contrast filled the hiatal hernia. IMPRESSION: Technically unsuccessful fluoroscopic guided feeding tube adjustment. Electronically Signed   By: Macy Mis M.D.   On: 03/24/2021 13:48   [place] EKG SITE RITE  Result Date: 03/25/2021 If Site Rite image not attached, placement could not be confirmed due to current cardiac rhythm.  Recent Labs    03/24/21 0540  WBC 9.0  HGB 11.8*  HCT 36.4*  PLT 334   Recent Labs    03/24/21 0540 03/25/21 0529  NA 136 139  K 3.3* 3.9  CL 106 110  CO2 20* 20*  GLUCOSE 90 82  BUN 7* 8  CREATININE 0.92 0.76  CALCIUM 10.0 10.1     Intake/Output Summary (Last 24 hours) at 03/26/2021 1319 Last data filed at 03/26/2021 0300 Gross per 24 hour  Intake 936 ml  Output 200 ml  Net 736 ml        Physical Exam: Vital Signs Blood pressure (!) 144/111, pulse (!) 102, temperature 97.8 F (36.6 C), temperature source Axillary, resp. rate 20, height 6' (1.829 m), weight 85.1 kg, SpO2 97 %.  Gen: no distress, normal appearing HEENT: oral mucosa pink and moist, NCAT Cardio: Tachycardic Chest: normal effort, normal rate of breathing Abd: soft, non-distended Ext: no edema Psych: Lethargic Skin: intact Neuro: Musculoskeletal:  Skin: No evidence of breakdown, no evidence of rash, loop site CDI, no hematoma palpated  Neurologic: Too lethargic to participate in exam.    Assessment/Plan: 1. Functional deficits which require 3+ hours per day of interdisciplinary therapy in a comprehensive inpatient rehab setting. Physiatrist is providing close team supervision and 24 hour management of active medical problems listed below. Physiatrist and rehab team continue to assess barriers to discharge/monitor patient progress toward functional and medical goals  Care  Tool:  Bathing  Bathing activity did not occur: Safety/medical concerns Body parts bathed by patient: Right arm, Left arm, Chest, Abdomen, Front perineal area, Buttocks, Right upper leg, Left upper leg, Right lower leg, Left lower leg, Face   Body parts bathed by helper: Right arm, Left arm, Chest, Buttocks, Right upper leg, Left upper leg, Right lower leg, Left lower leg, Face     Bathing assist Assist Level: Contact Guard/Touching assist     Upper Body Dressing/Undressing Upper body dressing Upper body dressing/undressing activity did not occur (including orthotics): Safety/medical concerns What is the patient wearing?: Pull over shirt    Upper body assist Assist Level: Supervision/Verbal cueing    Lower Body Dressing/Undressing Lower body dressing    Lower body dressing activity did not occur: Safety/medical concerns What is the patient wearing?: Pants     Lower body assist Assist for lower body dressing: Maximal Assistance - Patient 25 - 49%     Toileting Toileting    Toileting assist Assist for toileting: Minimal Assistance - Patient > 75%     Transfers Chair/bed transfer  Transfers assist  Chair/bed transfer activity did not occur: Safety/medical concerns (unable due to fatigue)  Chair/bed transfer assist level: Contact Guard/Touching assist Chair/bed transfer assistive device: Programmer, multimedia   Ambulation assist   Ambulation activity did not occur: Safety/medical concerns  Assist level: Contact Guard/Touching assist Assistive device: Walker-rolling Max distance: 168f   Walk 10 feet activity   Assist     Assist level: 2 helpers Assistive device: Hand held assist   Walk 50 feet activity   Assist Walk 50 feet with 2 turns activity did not occur: Safety/medical concerns         Walk 150 feet activity   Assist Walk 150 feet activity did not occur: Safety/medical concerns         Walk 10 feet on uneven surface   activity   Assist Walk 10 feet on uneven surfaces activity did not occur: Safety/medical concerns         Wheelchair     Assist Is the patient using a wheelchair?: Yes Type of Wheelchair: Manual    Wheelchair assist level: Dependent - Patient 0% Max wheelchair distance: 2025f   Wheelchair 50 feet with 2 turns activity    Assist        Assist Level: Dependent - Patient 0%   Wheelchair 150 feet activity     Assist      Assist Level: Dependent - Patient 0%   Blood pressure (!) 144/111, pulse (!) 102, temperature 97.8 F (36.6 C), temperature source Axillary, resp. rate 20, height 6' (1.829 m), weight 85.1 kg, SpO2 97 %.  Medical Problem List and Plan: 1. Functional deficits secondary to multifocal acute/early subacute cortical ischemia in the anterior right MCA territory, likely large vessel source from critical right ICA stenosis.  Status post right ICA stenting.  Left caudate small infarct>> procedure related versus occult atrial fibrillation.             -  Patient with decline in cognition in terms of level of energy.  Suspect that nutrition decreased fluid intake is likely contributing, recent labs were reviewed negative CBC and c-Met unremarkable.   CT of the head no new infarct , + paranasal sinusitis   Continue CIR             -ELOS/Goals: will need to extend stay due to medical issues Team conference today please see physician documentation under team conference tab, met with team  to discuss problems,progress, and goals. Formulized individual treatment plan based on medical history, underlying problem and comorbidities.   Continue CIR- PT, OT and SLP 15/7 schedule  May benefit from trial of neurostimulant discussed with family who agree  2.  Impaired mobility -DVT/anticoagulation:  Pharmaceutical: Lovenox             -antiplatelet therapy: continue Aspirin, hold Brilinta prior to PEG 3. Pain Management:  Left hip pain , resolved  Left shoulder pain  -no c/o today   4. Mood: LCSW to evaluate and provide emotional support             -antipsychotic agents: N/A 5. Neuropsych: This patient is capable of making decisions on his own behalf. 6. Skin/Wound Care: Routine skin care checks 7. Fluids/Electrolytes/Nutrition: hypoK + will supplement , give K rider IV x 2 , recheck BMET in am  8.  Bibasilar PNA- afebrile , leukocytosis resolved has persistent Xray abnormalities              -- resolved after course of Unasyn followed by Augmentin             9.  Frequent PVCs: Sees Dr. Stanford Breed as an outpatient  --recommends loop recorder>> placed 1/10 10: Hypertension: monitor off Benicar  Vitals:   03/25/21 2331 03/26/21 0332  BP: (!) 152/95 (!) 144/111  Pulse: 98 (!) 102  Resp: 20 20  Temp: 98.8 F (37.1 C) 97.8 F (36.6 C)  SpO2: 96% 97%  Increase Avapro to 389m. Add lopressor 12.518mBID 11: Hyperlipidemia: Zetia and Lipitor 12: EtOH use: advised to drink no more than 1 alcoholic beverage dialy 13: Overweight: BMI = 28.5. Rec: weight loss, diet and exercise as appropriate 14: CKD stage 3a: serum Cr = 0.99. Monitor At baseline  16: Hypokalemia: will need ongoing supplementation. Additional 4071madded 1/21.  17: Anemia: asymptomatic, mild stable  CBC Latest Ref Rng & Units 03/24/2021 03/20/2021 03/19/2021  WBC 4.0 - 10.5 K/uL 9.0 10.3 7.5  Hemoglobin 13.0 - 17.0 g/dL 11.8(L) 11.1(L) 10.1(L)  Hematocrit 39.0 - 52.0 % 36.4(L) 34.4(L) 31.6(L)  Platelets 150 - 400 K/uL 334 359 314  HGB remains stable , improving  18: Thyroid nodule: follow-up ultrasound in one year recommended 19. Dysphagia: continue D with nectars, aspiration precautions             -advance per SLP  TPN via PICC, start today  Plan for PEG Monday after CT abdomen, scheduled for today. ADDENDUM: CT shows increase in size of hiatal hernia- general surgery notified.  21. Eye irritation: eye drops added.  22. Lethargy: has been an ongoing issue , CT head showing no new  infarct, but there is evidence of acute frontal and paranasal sinusitis, has been on Augmentin, will switch to cephalosporin, fluroquinolone another option although with more potential side effects  UA unremarkable except + nitrite , no symptoms of UTI (has been incont even PTA per wife )  CBC without leukocytosis 23. Transaminitis: Tylenol d/ced, repeat CMP  1/22 shows minimal elevation of AlkP and ALT, AST is normal  24. Tachycardia: add lopressor 12.19m BID    LOS: 17 days A FACE TO FACE EVALUATION WAS PERFORMED  Axelle Szwed P Ayan Yankey 03/26/2021, 1:19 PM

## 2021-03-26 NOTE — Progress Notes (Signed)
Occupational Therapy Session Note  Patient Details  Name: David [name] MRN: [id number] Date of Birth: 1940-11-01  Today's Date: 03/26/2021 OT Individual Time: 7096-2836 OT Individual Time Calculation (min): 50 min    Short Term Goals: Week 2:  OT Short Term Goal 1 (Week 2): Pt will complete UB dressing at supervision level sitting unsupported. OT Short Term Goal 1 - Progress (Week 2): Not met OT Short Term Goal 2 (Week 2): Pt will complete LB dressing with min assist sit to stand for all aspects. OT Short Term Goal 2 - Progress (Week 2): Not met OT Short Term Goal 3 (Week 2): Pt will complete two grooming tasks in standing at the sink with min guard assist for at least 3 mins. OT Short Term Goal 3 - Progress (Week 2): Not met Week 3:  OT Short Term Goal 1 (Week 3): Continue working on [facility] a contact guard assist overall.  Skilled Therapeutic Interventions/Progress Updates:  Patient met lying supine in bed after hygiene/clothing management at bed level with assist from son-in-law and NT. Much more alert that at time of a.m. session yesterday although patient remains minimally verbal. Patient in agreement with OT treatment session. 0/10 pain reported at rest and with activity although family reports patient c/o mild soreness in throat after attempts to place Cortrak. Family with request for patient to complete bathing/dressing during OT treatment session this a.m. Mod A for supine to EOB with increased time/effort and assist and assist at BLE and trunk. Close supervision A to maintain static sitting at EOB. Seated EOB, patient required Max A to doff/don UB clothing and Max A with hand over hand to complete UB bathing. Step by step multimodal and repeat cues required secondary to slow initiation with delayed processing and delayed motor response. Great difficulty to manage ADL items including towel when washing and deodorant. Assist to thread BLE through pants seated EOB but with  verbal cues for initiation patient able to pull up to thighs. Max A to doff/don footwear. Sit to stand from elevated EOB to RW with multimodal cues for hand placement and Mod-Max A. Patient able to take lateral steps toward [facility] with Mod A and assist to manage RW. Focus then shifted to blocked practice for sit to stand transfers with patient able to stand from EOB x3 trials with Max A initially progressing to heavy Mod A. Cues for hand placement and increased time/effort. Frequent and extended rest breaks required secondary to fatigue. Return to supine with assist at BLE and to control descent of trunk. Patient missed last 10 minutes of skilled OT treatment session 2/2 fatigue. Session concluded with patient in chair position in bed with call bell within reach, bed alarm activated and all needs met.   Therapy Documentation Precautions:  Precautions Precautions: Fall, Other (comment) Precaution Comments: L hemiparesis, cortrak placed Restrictions Weight Bearing Restrictions: No General: General OT Amount of Missed Time: 10 Minutes  Therapy/Group: Individual Therapy  David [name] 03/26/2021, 6:51 AM

## 2021-03-26 NOTE — Progress Notes (Addendum)
CT of abdomen completed. General surgery re-consulted for evaluation for gastrostomy tube.

## 2021-03-26 NOTE — Progress Notes (Signed)
PHARMACY - TOTAL PARENTERAL NUTRITION CONSULT NOTE   Indication:  aspiration risk >>plan PEG  Patient Measurements: Height: 6' (182.9 cm) Weight: 85.1 kg (187 lb 9.8 oz) IBW/kg (Calculated) : 77.6 TPN AdjBW (KG): 85.1 Body mass index is 25.44 kg/m. Usual Weight:   Assessment: s/p MCA infarct s/p R ICA stenting for stenosis. Left caudate small infarct>> procedure related versus occult atrial fibrillation. BMI = 28.5. Start TPN due to con't NPO status with high aspiration risk.   Pulled out NGT 1/24. Previous Cortrak unable to be advanced due to large Startup. Barriers to PEG placement: CT scan of the abdomen and pelvis to assess hiatal hernia and abdominal anatomy has been ordered.  PMH: CAD, h/o prostate cancer, PNA, R carotid stenosis, Flu A, frequent PVCs, HTN, HLD, thyroid nodule, renal oncocytoma of right kidney in 2014   Glucose / Insulin: No noted h/o DM but now A1C 6.3. Check CBGs and add SSI empirically. Electrolytes: Na 139, K 3.9, Bicarb 20, Ca 10.1 - on Kdur 20 meq BID Renal: Scr <1 Hepatic: AST/ALT 31/50, Alkphos 132, Tbili 0.6.Albumin 2.2  - Megace 400mg  BID, po PPI 40mg  BID,  Intake / Output; MIVF: 1/2 NS at 54ml/hr GI Imaging: 1/24: CT of the head no new infarct , + paranasal sinusitis  GI Surgeries / Procedures: none for abdomen.  Central access: PICC 1/26 TPN start date: 1/27  Nutritional Goals: Goal TPN rate is 82 mL/hr (provides 108 g of protein and 2129 kcals per day)  RD Assessment: Last 03/23/21 Estimated Needs Total Energy Estimated Needs: 2100-2300 Total Protein Estimated Needs: 105-120 grams Total Fluid Estimated Needs: >/= 2.1 L  Current Nutrition:  NPO  Plan:  Start TPN at 40 mL/hr at 1800 will provide 52g protein and 1038 kcal Electrolytes in TPN: Na 44mEq/L, K 12mEq/L, Ca 28mEq/L, Mg 33mEq/L, and Phos 16mmol/L. Cl:Ac 1:1 Add standard MVI and trace elements to TPN Initiate Moderate q6h SSI and adjust as needed  Reduce MIVF to 30 mL/hr at  1800 Monitor TPN labs on Mon/Thurs, PRN D/c Kdur after dose this AM (will be getting 60meq in next TPN bag)   Lorma Heater S. Alford Highland, PharmD, BCPS Clinical Staff Pharmacist [email or web address] Alford Highland, [employer] 03/26/2021,7:30 AM

## 2021-03-26 NOTE — Progress Notes (Addendum)
Speech Language Pathology Daily Session Note  Patient Details  Name: David [name] MRN: [id number] Date of Birth: 1941/01/20  Today's Date: 03/26/2021 SLP Individual Time: 1430-1540 SLP Individual Time Calculation (min): 70 min  Short Term Goals: Week 3: SLP Short Term Goal 1 (Week 3): STG=LTG due to ELOS  Skilled Therapeutic Interventions: Skilled ST treatment focused on cognitive and swallowing goals. Pt awake in bed on arrival and accompanied by wife. Pt continues to exhibit substantial fatigue but with improved ability to keep eyes open throughout session as compared to yesterday with min-to-mod A verbal cues to attend to therapist. Pt was oriented to person/place only and answered biographical questions accurately (DOB, age) with extended processing time (1-2 minutes per question) and mod-to-max A verbal cues for repetition of questions and occasional field of choices. Verbal output limited by fatigue without evidence of true language impairment at this time. Intermittent congested cough present without presentation of PO intake. Pt required total A for oral care using suction toothette. Pt agreeable to consume conservative PO trials consisting of ice chips, puree, and HTL by spoon. Pt required total feeding assist. Max A multimodal cues required to execute chin tuck and required tactile cues on back of head to perform. Pt only able to initiate secondary swallow during approximately 10% of occasions with significant oral delay. Pt exhibited overt s/sx of aspiration during 90% of occassions with both ice chips and honey thick liquids as evidenced by delayed cough. Pt unable to orally transition pureed bolus and required suctioning to remove bolus during 3/3 attempts. Trials eventually discontinued due to increasing fatigue and minimal swallow response. Oral care performed once more with total A.   Per today's clinical swallow assessment, pt with overall decreased swallow function as compared to  yesterday's encounter as evidenced by significant oral deficits and delay, inability to tolerate pureed textures, increased overt s/sx of aspiration with all tested consistencies, decreased ability to execute safe swallowing compensatory swallows, and pt now unable to volitionally swallow. Patient is not safe for PO diet at this time and remains at high aspiration risk. Recommend continuation of NPO status. Continue ice chips for pleasure following oral care. Still awaiting alternate means of nutrition. Communicated recommendations and findings to pt and spouse.  Patient was left in bed with alarm activated and immediate needs within reach at end of session. Continue per current plan of care.       Pain Pain Assessment Pain Scale: 0-10 Pain Score: 0-No pain  Therapy/Group: Individual Therapy  Patty Sermons 03/26/2021, 4:37 PM

## 2021-03-26 NOTE — Progress Notes (Signed)
Physical Therapy Session Note  Patient Details  Name: David [name] MRN: [id number] Date of Birth: 05/13/1940  Today's Date: 03/26/2021 PT Individual Time: 0806-0903 PT Individual Time Calculation (min): 57 min   Short Term Goals: Week 3:  PT Short Term Goal 1 (Week 3): = to LTGs based on ELOS  Skilled Therapeutic Interventions/Progress Updates:  Patient supine in bed on entrance to room. Patient initially asleep and unaware of environment. Agreeable to PT session but extremely fatigued and in/ out of full awareness throughout session with pt easily roused. ~100% acknowledgment of cues with 50% self initiation throughout.   First time this therapist has worked with pt but pt appears to be declining and does not seem to be able to participate in 3 hours of quality therapy per day. D/t medical decline, may benefit from increased medical care from acute team prior to return to IP rehab or consideration of change to 15/7 scheduling while in IP rehab if nutrition issues are resolved today. Pt has not had adequate nutrition to fully participate in therapy sessions at this level.   Patient with no pain complaint throughout session.  On entrance, pt not on supplemental O2 via [place] and with PICC line in place at R UE. SpO2 throughout session at 91% or up to 97%.   Prior to mobility, vitals taken supine with HOB at 30 deg with BP = 167/84, SpO2 = 95%, pulse = 96.  After activity of rolling in bed, SpO2 = 97% and pulse = 91.   Therapeutic Activity: Bed Mobility: Pt noted to be incontinent of urine and d/t pt's lethargy and inability to remain alert, bed level mobility and brief change initiated. Pt performed bed rolling several times to each side requiring vc/tc and from MinA to MaxA to initiate and then to complete roll to each side. Pt provided with time to initiate following vc, then added tc to vc, then physical assist. Pt unable to follow instructions for positioning LE into hooklying position for  assist to scoot up in bed. Requires Total A for move to [facility]. Pt positioned for comfort.   IV RN arrives to flush PICC line.   Patient supine  in bed at end of session with brakes locked, bed alarm set, and all needs within reach. Family arrives at end of session with questions re: pt's status following PICC line procedure the previous evening. Information provided to family as known by this therapist but this is the first time this therapist has worked with pt. Alerted RN that family has questions.      Therapy Documentation Precautions:  Precautions Precautions: Fall, Other (comment) Precaution Comments: L hemiparesis, cortrak placed Restrictions Weight Bearing Restrictions: No General:   Vital Signs:  Pain: No pain complaint or assessment of faces scale from pt this session.    Therapy/Group: Individual Therapy  Alger Simons PT, DPT 03/26/2021, 8:56 AM

## 2021-03-26 NOTE — Progress Notes (Addendum)
Speech Language Pathology Note  Patient Details  Name: David [name] MRN: [id number] Date of Birth: 1940/08/07 Today's Date: 03/26/2021  SLP was contacted by Tacey Ruiz, NP via Epic secure chat regarding contacting pt's spouse regarding questions for SLP. Spoke with patient's daughter in room and spouse via phone who stated she would like to defer questions until able to speak with her daughter in person tonight regarding palliative care? Spouse hopeful to speak with team early in the week. Spouse and daughter with no further questions for SLP at this time.   Patty Sermons 03/26/2021, 5:56 PM

## 2021-03-26 NOTE — Consult Note (Signed)
Consult Note  David [name] 1940/07/10  [id number].    Requesting MD: Dr. 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  He underwent diagnostic cerebral angiogram with right carotid stenting and was placed on Ticagrelor 90 mg twice daily and aspirin 81 mg.  He has since discharged to inpatient rehab.  Following stroke he has been followed by SLP and had barium swallow studies with findings for dysphagia and has ongoing significant risk for aspiration.  He is NPO. Multiple attempts have been made to place nasogastric tube/core track to allow enteral feeding which has been unsuccessful.  Patient has a large hiatal hernia which causing issues with placement.   RN is bedside and assists with history. He is lethargic at time of my exam, with intermittent productive cough, on supplemental O2 via [place], alert and oriented x2 (oriented to self and place).  General surgery asked to consult in regard to feeding tube placement. History of umbilical and ventral hernia repair with mesh  ROS: Review of Systems  Unable to perform ROS: Mental acuity   Family History  Problem Relation Age of Onset   Cancer Mother        colon   Colon cancer Mother 76   Pulmonary fibrosis Father        father owned a Medical sales representative business (father blamed inhalation of chemicals and fumes)   Pulmonary fibrosis Brother    Coronary artery disease Other    Hyperlipidemia Other    Colon polyps Neg Hx    Esophageal cancer Neg Hx    Rectal cancer Neg Hx    Stomach cancer Neg Hx     Past Medical History:  Diagnosis Date   Allergy    Arthritis    gen.  and left hip   BPH (benign prostatic hypertrophy)    Coronary  artery disease    GERD (gastroesophageal reflux disease)    occasional   History of kidney stones    2007   Hyperlipidemia    Hypertension    Kidney tumor    LBP (low back pain) 06/2005   L5 radicular symptoms; MRI of LS spine severe spondylosis at L4-L5 with central cancal stenosis    Nocturia    Organic impotence    Prostate cancer ([place]) 10/2011   (Low grade) [facility] - Dr. Junious Silk   Pyelonephritis 03/05/2013   Renal oncocytoma of right kidney 01/23/2013   As 3.2 x 3.5 cm enhancing exophytic mass projecting off the lower pole. This is consistent with a renal cell carcinoma. No retroperitoneal lymphadenopathy.    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   Wears glasses     Past Surgical History:  Procedure Laterality Date   COLONOSCOPY  02/15/2006, 2014   Internal hemorrhoids (Dr [name]), last in 2014   [place] Right 1980   IR ANGIO INTRA EXTRACRAN SEL INTERNAL CAROTID UNI R MOD SED  03/05/2021   IR INTRAVSC STENT CERV CAROTID W/EMB-PROT MOD SED INCL ANGIO  03/05/2021   IR US GUIDE VASC ACCESS RIGHT  03/05/2021   LOOP RECORDER INSERTION N/A 03/03/2021   Procedure: LOOP RECORDER INSERTION;  Surgeon: Constance Haw, MD;  Location: [place] [facility] [facility];  Service: Cardiovascular;  Laterality: N/A;  LYMPHADENECTOMY Bilateral 02/15/2013   Procedure: LYMPHADENECTOMY WITH INDOCYANINE GREEN DYE INJECTION;  Surgeon: [name], MD;  Location: [facility];  Service: Urology;  Laterality: Bilateral;   PROSTATE BIOPSY N/A 12/18/2012   Procedure: BIOPSY TRANSRECTAL ULTRASONIC PROSTATE (TUBP);  Surgeon: [name], MD;  Location: [facility];  Service: Urology;  Laterality: N/A;   PROSTATE BIOPSY  12/05/11   [place] 6, 3/12 cores   RADIOLOGY WITH ANESTHESIA N/A 03/05/2021   Procedure: IR WITH ANESTHESIA;  Surgeon: Radiologist, Medication, MD;  Location: [facility];  Service: Radiology;  Laterality: N/A;   REPAIR UMBILICAL AND VENTRAL HERNIA'S W/ MESH   08-21-2009   RIGHT/LEFT HEART CATH AND CORONARY ANGIOGRAPHY N/A 08/06/2019   Procedure: RIGHT/LEFT HEART CATH AND CORONARY ANGIOGRAPHY;  Surgeon: [name], Peter M, MD;  Location: [place] [facility];  Service: Cardiovascular;  Laterality: N/A;   ROBOT ASSISTED LAPAROSCOPIC RADICAL PROSTATECTOMY N/A 02/15/2013   Procedure: ROBOTIC ASSISTED LAPAROSCOPIC RADICAL PROSTATECTOMY;  Surgeon: [name], MD;  Location: [facility];  Service: Urology;  Laterality: N/A;   ROBOTIC ASSITED PARTIAL NEPHRECTOMY Right 02/15/2013   Procedure: ROBOTIC ASSITED PARTIAL NEPHRECTOMY;  Surgeon: [name], MD;  Location: [facility];  Service: Urology;  Laterality: Right;   SPERMATOCELECTOMY Left 12/18/2012   Procedure: LEFT SPERMATOCELECTOMY;  Surgeon: [name], MD;  Location: [facility];  Service: Urology;  Laterality: Left;   TONSILLECTOMY AND ADENOIDECTOMY  as child   TOTAL KNEE ARTHROPLASTY Left 05/26/2020   Procedure: TOTAL KNEE ARTHROPLASTY;  Surgeon: Paralee Cancel, MD;  Location: [facility];  Service: Orthopedics;  Laterality: Left;  70 mins   TRANSURETHRAL RESECTION OF PROSTATE  AGE 65    Social History:  reports that he quit smoking about 40 years ago. His smoking use included cigarettes. He has a 2.50 pack-year smoking history. He has never used smokeless tobacco. He reports current alcohol use of about 1.0 standard drink per week. He reports that he does not currently use drugs after having used the following drugs: Flunitrazepam.  Allergies:  Allergies  Allergen Reactions   Codeine Itching    Severe hiccups   Ace Inhibitors Other (See Comments)    REACTION: Cough   Amlodipine     LE swelling   Hydrocodone Hives and Itching    Hiccoughs   Percocet [Oxycodone-Acetaminophen]     Severe hiccups    Medications Prior to Admission  Medication Sig Dispense Refill   acetaminophen (TYLENOL) 500 MG tablet Take 500-1,000 mg by mouth every 6 (six) hours as needed for moderate pain.      albuterol (VENTOLIN HFA) 108 (90 Base) MCG/ACT inhaler Inhale 2 puffs into the lungs every 6 (six) hours as needed for wheezing or shortness of breath (Cough). 18 g 1   Ampicillin-Sulbactam 3 g in sodium chloride 0.9 % 100 mL Inject 3 g into the vein every 6 (six) hours.     aspirin EC 81 MG tablet Take 1 tablet (81 mg total) by mouth daily. 100 tablet 3   atorvastatin (LIPITOR) 80 MG tablet TAKE 1 TABLET(80 MG) BY MOUTH DAILY AT 6 PM 90 tablet 3   benzonatate (TESSALON) 100 MG capsule Take 1 capsule (100 mg total) by mouth 3 (three) times daily. 20 capsule 0   cetirizine (ZYRTEC) 10 MG tablet Take 1 tablet (10 mg total) by mouth daily. 90 tablet 2   dextromethorphan (DELSYM) 30 MG/5ML liquid Take 5 mLs (30 mg total) by mouth 3 (three) times daily. 89 mL 0  DULoxetine (CYMBALTA) 30 MG capsule Take 30 mg by mouth daily.     ezetimibe (ZETIA) 10 MG tablet Take 10 mg by mouth daily.     fluticasone (FLONASE) 50 MCG/ACT nasal spray Place 2 sprays into both nostrils daily. 18 mL 2   guaiFENesin (MUCINEX) 600 MG 12 hr tablet Take 1 tablet (600 mg total) by mouth 2 (two) times daily.     olmesartan (BENICAR) 40 MG tablet Take 1 tablet (40 mg total) by mouth daily. 90 tablet 3   pantoprazole (PROTONIX) 40 MG tablet TAKE 1 TABLET BY MOUTH TWICE DAILY 60 tablet 11   ticagrelor (BRILINTA) 90 MG TABS tablet Take 1 tablet (90 mg total) by mouth 2 (two) times daily. 60 tablet    Vitamin D, Cholecalciferol, 50 MCG (2000 UT) CAPS Take 2,000 Units by mouth daily.       Blood pressure (!) 153/107, pulse 97, temperature 98.4 F (36.9 C), resp. rate 16, height 6' (1.829 m), weight 85.1 kg, SpO2 99 %. Physical Exam: General: pleasant, WD, male who is laying in bed in NAD HEENT: head is normocephalic, atraumatic.  Sclera are noninjected.  Pupils equal and round. EOMs intact.  Ears and nose without any masses or lesions.  Mouth is pink and moist Heart: regular, rate, and rhythm.  Normal s1,s2. No obvious murmurs,  gallops, or rubs noted.  Palpable radial and pedal pulses bilaterally Lungs: Respiratory effort nonlabored on supplemental O2 via [place]. Intermittent wet cough Abd: soft, NT, ND, +BS, no masses, hernias, or organomegaly. Well healed umbilical and epigastric surgical scars  MSK: all 4 extremities are symmetrical with no cyanosis, clubbing, or edema. Skin: warm and dry with no masses, lesions, or rashes Neuro: Cranial nerves 2-12 grossly intact, sensation is normal throughout Psych: A&Ox2   Results for orders placed or performed during the hospital encounter of 03/15/2021 (from the past 48 hour(s))  Basic metabolic panel     Status: Abnormal   Collection Time: 03/25/21  5:29 AM  Result Value Ref Range   Sodium 139 135 - 145 mmol/L   Potassium 3.9 3.5 - 5.1 mmol/L   Chloride 110 98 - 111 mmol/L   CO2 20 (L) 22 - 32 mmol/L   Glucose, Bld 82 70 - 99 mg/dL    Comment: Glucose reference range applies only to samples taken after fasting for at least 8 hours.   BUN 8 8 - 23 mg/dL   Creatinine, Ser 0.76 0.61 - 1.24 mg/dL   Calcium 10.1 8.9 - 10.3 mg/dL   GFR, Estimated >60 >60 mL/min    Comment: (NOTE) Calculated using the CKD-EPI Creatinine Equation (2021)    Anion gap 9 5 - 15    Comment: Performed at [facility] [address], [place], [place] [address]   CT ABDOMEN PELVIS W CONTRAST  Result Date: 03/26/2021 CLINICAL DATA:  Evaluate hiatal hernia EXAM: CT ABDOMEN AND PELVIS WITH CONTRAST TECHNIQUE: Multidetector CT imaging of the abdomen and pelvis was performed using the standard protocol following bolus administration of intravenous contrast. RADIATION DOSE REDUCTION: This exam was performed according to the departmental dose-optimization program which includes automated exposure control, adjustment of the mA and/or kV according to patient size and/or use of iterative reconstruction technique. CONTRAST:  54mL OMNIPAQUE IOHEXOL 350 MG/ML SOLN COMPARISON:  08/14/2017 FINDINGS: Lower  chest: There are new patchy infiltrates in the both lower lung fields. There is small left pleural effusion. There is minimal right pleural effusion. Coronary artery calcifications are seen.  Hepatobiliary: No focal abnormality is seen in the liver. Gallbladder is unremarkable. Pancreas: No focal abnormality is seen Spleen: Unremarkable. Adrenals/Urinary Tract: Adrenals are not enlarged. There is no hydronephrosis. There is 8 mm calculus in the upper pole of left kidney. There is 2 mm calculus in the lower pole of left kidney. There are few low-density lesions in the renal cortex largest measuring 2.6 cm in the anterior midportion of left kidney. Ureters are unremarkable. Urinary bladder is unremarkable. Stomach/Bowel: There is significant interval increase in size of hiatal hernia. Possible organo axial volvulus of the stomach in the hiatal hernia. Small bowel loops are not dilated. Appendix is not distinctly seen. Cecum appears more medial than usual in position. There is no significant wall thickening in colon. There is no pericolic stranding. Vascular/Lymphatic: There are scattered arterial calcifications. Reproductive: Prostate is smaller than usual with central low attenuation suggesting possible transurethral resection. Other: There is no ascites or pneumoperitoneum. Small bilateral inguinal hernias containing fat are noted. Musculoskeletal: Degenerative changes are noted in the lumbar spine, more severe at L4-L5 level. IMPRESSION: There is significant interval increase in size of hiatal hernia. Possible organo axial volvulus of stomach within the hiatal hernia. There is no evidence of intestinal obstruction or pneumoperitoneum. There is no hydronephrosis. There are new patchy infiltrates in both lower lung fields suggesting atelectasis/pneumonia. Small left pleural effusion. Minimal right pleural effusion. Coronary artery calcifications are seen. There are 2 nonobstructing left renal stones. There are  multiple renal cysts. Other findings as described in the body of the report. Electronically Signed   By: Elmer Picker M.D.   On: 03/26/2021 14:57   DG Fluoro Rm 1-60 Min  Result Date: 03/26/2021 CLINICAL DATA:  Feeding difficulties, weak swallow, concern for aspiration, NPO status EXAM: DG C-ARM 1-60 MIN CONTRAST:  10cc water soluble contrast FLUOROSCOPY TIME:  Fluoroscopy Time:  1 minute and 48 seconds Radiation Exposure Index (if provided by the fluoroscopic device): 9.6 mGy Number of Acquired Spot Images: 0 COMPARISON:  Attempt from 03/24/21, plain films abd. FINDINGS: The left naris was prepped with viscous lidocaine jelly and a 10 [place] Dobbhoff style catheter was inserted. Using fluoroscopic guidance, the tube was advanced into the intrathoracic stomach. Despite multiple attempts with guidewire, contrast medium, and repositioning of table and patient, access to the intra-abdominal portion of the stomach was not achieved. Attempt was aborted, tube removed, and examination ended. IMPRESSION: Unsuccessful second attempt of fluoroscopic guided feeding tube placement. Consider endoscopic placement of short-term NG/ND tube or surgical placement of G-tube/J-tube. Anatomy not amenable to percutaneous G-tube placement. Findings communicated to ordering provider Risa Grill, PA-C, at time of exam. Procedure performed by Pasty Spillers PA, supervised by Abigail Miyamoto, M.D. Electronically Signed   By: Abigail Miyamoto M.D.   On: 03/26/2021 08:24   [place] EKG SITE RITE  Result Date: 03/25/2021 If Site Rite image not attached, placement could not be confirmed due to current cardiac rhythm.     Assessment/Plan History of ICA stroke s/p stent on ticagrelor and ASA Dysphagia, aspiration risk - CT scan 1/27 with: There is significant interval increase in size of hiatal hernia. Possible organo axial volvulus of stomach within the hiatal hernia. There is no evidence of intestinal obstruction or pneumoperitoneum. -  to start TPN - have discussed with rehab team and surgeon. Difficult situation given inability to place NGT for enteral feeding, aspiration risk, and patient's debility and increased surgical risk after recent stroke. Given these risks palliative consult would be helpful and  would like to reassess patient on Monday after palliative consult   I reviewed hospitalist notes, last 24 h vitals and pain scores, last 48 h intake and output, last 24 h labs and trends, and last 24 h imaging results.  This care required high  level of medical decision making.   Winferd Humphrey, [facility] Surgery 03/26/2021, 3:53 PM Please see Amion for pager number during day hours 7:00am-4:30pm

## 2021-03-27 DIAGNOSIS — E43 Unspecified severe protein-calorie malnutrition: Secondary | ICD-10-CM

## 2021-03-27 DIAGNOSIS — B59 Pneumocystosis: Secondary | ICD-10-CM

## 2021-03-27 DIAGNOSIS — J329 Chronic sinusitis, unspecified: Secondary | ICD-10-CM

## 2021-03-27 DIAGNOSIS — B9689 Other specified bacterial agents as the cause of diseases classified elsewhere: Secondary | ICD-10-CM

## 2021-03-27 LAB — COMPREHENSIVE METABOLIC PANEL
ALT: 31 U/L (ref 0–44)
AST: 25 U/L (ref 15–41)
Albumin: 2.6 g/dL — ABNORMAL LOW (ref 3.5–5.0)
Alkaline Phosphatase: 130 U/L — ABNORMAL HIGH (ref 38–126)
Anion gap: 7 (ref 5–15)
BUN: 12 mg/dL (ref 8–23)
CO2: 23 mmol/L (ref 22–32)
Calcium: 10.3 mg/dL (ref 8.9–10.3)
Chloride: 109 mmol/L (ref 98–111)
Creatinine, Ser: 0.87 mg/dL (ref 0.61–1.24)
GFR, Estimated: >60 mL/min (ref >60)
Glucose, Bld: 127 mg/dL — ABNORMAL HIGH (ref 70–99)
Potassium: 3.7 mmol/L (ref 3.5–5.1)
Sodium: 139 mmol/L (ref 135–145)
Total Bilirubin: 0.6 mg/dL (ref 0.3–1.2)
Total Protein: 6.5 g/dL (ref 6.5–8.1)

## 2021-03-27 LAB — GLUCOSE, CAPILLARY
Glucose-Capillary: 125 mg/dL — ABNORMAL HIGH (ref 70–99)
Glucose-Capillary: 131 mg/dL — ABNORMAL HIGH (ref 70–99)
Glucose-Capillary: 137 mg/dL — ABNORMAL HIGH (ref 70–99)
Glucose-Capillary: 142 mg/dL — ABNORMAL HIGH (ref 70–99)
Glucose-Capillary: 162 mg/dL — ABNORMAL HIGH (ref 70–99)

## 2021-03-27 LAB — TRIGLYCERIDES: Triglycerides: 88 mg/dL (ref <150)

## 2021-03-27 LAB — PHOSPHORUS: Phosphorus: 2 mg/dL — ABNORMAL LOW (ref 2.5–4.6)

## 2021-03-27 LAB — MAGNESIUM: Magnesium: 2 mg/dL (ref 1.7–2.4)

## 2021-03-27 MED ORDER — POTASSIUM PHOSPHATES 15 MMOLE/5ML IV SOLN
20.0000 mmol | Freq: Once | INTRAVENOUS | Status: AC
  Administered 2021-03-27: 20 mmol via INTRAVENOUS
  Filled 2021-03-27: qty 6.67

## 2021-03-27 MED ORDER — METHYLPHENIDATE HCL 5 MG PO TABS
10.0000 mg | ORAL_TABLET | Freq: Two times a day (BID) | ORAL | Status: DC
  Filled 2021-03-27: qty 2

## 2021-03-27 MED ORDER — TRAVASOL 10 % IV SOLN
INTRAVENOUS | Status: DC
  Filled 2021-03-27: qty 792

## 2021-03-27 MED ORDER — GLYCOPYRROLATE 1 MG PO TABS
1.0000 mg | ORAL_TABLET | Freq: Two times a day (BID) | ORAL | Status: DC
  Administered 2021-03-27 – 2021-03-28 (×2): 1 mg via ORAL
  Filled 2021-03-27 (×3): qty 1

## 2021-03-27 MED ORDER — SODIUM CHLORIDE 0.45 % IV SOLN: INTRAVENOUS | Status: DC

## 2021-03-27 NOTE — Progress Notes (Addendum)
PROGRESS NOTE   Subjective/Complaints: Found patient in bed. He was groaning a bit but said he was not in distress. No problems reported over night by nursing. Family reports some difficulty handling secretions  ROS: Limited due to cognitive/behavioral       Objective:   CT ABDOMEN PELVIS W CONTRAST  Result Date: 03/26/2021 CLINICAL DATA:  Evaluate hiatal hernia EXAM: CT ABDOMEN AND PELVIS WITH CONTRAST TECHNIQUE: Multidetector CT imaging of the abdomen and pelvis was performed using the standard protocol following bolus administration of intravenous contrast. RADIATION DOSE REDUCTION: This exam was performed according to the departmental dose-optimization program which includes automated exposure control, adjustment of the mA and/or kV according to patient size and/or use of iterative reconstruction technique. CONTRAST:  37mL OMNIPAQUE IOHEXOL 350 MG/ML SOLN COMPARISON:  08/14/2017 FINDINGS: Lower chest: There are new patchy infiltrates in the both lower lung fields. There is small left pleural effusion. There is minimal right pleural effusion. Coronary artery calcifications are seen. Hepatobiliary: No focal abnormality is seen in the liver. Gallbladder is unremarkable. Pancreas: No focal abnormality is seen Spleen: Unremarkable. Adrenals/Urinary Tract: Adrenals are not enlarged. There is no hydronephrosis. There is 8 mm calculus in the upper pole of left kidney. There is 2 mm calculus in the lower pole of left kidney. There are few low-density lesions in the renal cortex largest measuring 2.6 cm in the anterior midportion of left kidney. Ureters are unremarkable. Urinary bladder is unremarkable. Stomach/Bowel: There is significant interval increase in size of hiatal hernia. Possible organo axial volvulus of the stomach in the hiatal hernia. Small bowel loops are not dilated. Appendix is not distinctly seen. Cecum appears more medial than usual  in position. There is no significant wall thickening in colon. There is no pericolic stranding. Vascular/Lymphatic: There are scattered arterial calcifications. Reproductive: Prostate is smaller than usual with central low attenuation suggesting possible transurethral resection. Other: There is no ascites or pneumoperitoneum. Small bilateral inguinal hernias containing fat are noted. Musculoskeletal: Degenerative changes are noted in the lumbar spine, more severe at L4-L5 level. IMPRESSION: There is significant interval increase in size of hiatal hernia. Possible organo axial volvulus of stomach within the hiatal hernia. There is no evidence of intestinal obstruction or pneumoperitoneum. There is no hydronephrosis. There are new patchy infiltrates in both lower lung fields suggesting atelectasis/pneumonia. Small left pleural effusion. Minimal right pleural effusion. Coronary artery calcifications are seen. There are 2 nonobstructing left renal stones. There are multiple renal cysts. Other findings as described in the body of the report. Electronically Signed   By: Elmer Picker M.D.   On: 03/26/2021 14:57   DG Fluoro Rm 1-60 Min  Result Date: 03/26/2021 CLINICAL DATA:  Feeding difficulties, weak swallow, concern for aspiration, NPO status EXAM: DG C-ARM 1-60 MIN CONTRAST:  10cc water soluble contrast FLUOROSCOPY TIME:  Fluoroscopy Time:  1 minute and 48 seconds Radiation Exposure Index (if provided by the fluoroscopic device): 9.6 mGy Number of Acquired Spot Images: 0 COMPARISON:  Attempt from 03/24/21, plain films abd. FINDINGS: The left naris was prepped with viscous lidocaine jelly and a 10 [place] Dobbhoff style catheter was inserted. Using fluoroscopic guidance, the tube was advanced into  the intrathoracic stomach. Despite multiple attempts with guidewire, contrast medium, and repositioning of table and patient, access to the intra-abdominal portion of the stomach was not achieved. Attempt was aborted,  tube removed, and examination ended. IMPRESSION: Unsuccessful second attempt of fluoroscopic guided feeding tube placement. Consider endoscopic placement of short-term NG/ND tube or surgical placement of G-tube/J-tube. Anatomy not amenable to percutaneous G-tube placement. Findings communicated to ordering provider Risa Grill, PA-C, at time of exam. Procedure performed by Pasty Spillers PA, supervised by Abigail Miyamoto, M.D. Electronically Signed   By: Abigail Miyamoto M.D.   On: 03/26/2021 08:24   [place] EKG SITE RITE  Result Date: 03/25/2021 If Site Rite image not attached, placement could not be confirmed due to current cardiac rhythm.  No results for input(s): WBC, HGB, HCT, PLT in the last 72 hours.  Recent Labs    03/25/21 0529 03/27/21 0258  NA 139 139  K 3.9 3.7  CL 110 109  CO2 20* 23  GLUCOSE 82 127*  BUN 8 12  CREATININE 0.76 0.87  CALCIUM 10.1 10.3     Intake/Output Summary (Last 24 hours) at 03/27/2021 1336 Last data filed at 03/26/2021 1920 Gross per 24 hour  Intake --  Output 200 ml  Net -200 ml        Physical Exam: Vital Signs Blood pressure (!) 161/84, pulse 100, temperature 98.2 F (36.8 C), temperature source Oral, resp. rate 20, height 6' (1.829 m), weight 85.1 kg, SpO2 94 %.  Constitutional: No distress . Vital signs reviewed. HEENT: NCAT, EOMI, oral membranes moist Neck: supple Cardiovascular: tachy without murmur. No JVD    Respiratory/Chest: CTA Bilaterally without wheezes or rales. Did not appear to be overly working to breath    GI/Abdomen: BS +, non-tender, non-distended Ext: no clubbing, cyanosis, or edema Psych: flat but attempts to cooperate  Skin: intact, loop site intact Neuro: responds to my questions. Limited insight and awareness. Speech low volume/dysarthric. Poor oro-motor control. Unable to participate in MMT Musculoskeletal: no focal jt/limb pain     Assessment/Plan: 1. Functional deficits which require 3+ hours per day of  interdisciplinary therapy in a comprehensive inpatient rehab setting. Physiatrist is providing close team supervision and 24 hour management of active medical problems listed below. Physiatrist and rehab team continue to assess barriers to discharge/monitor patient progress toward functional and medical goals  Care Tool:  Bathing  Bathing activity did not occur: Safety/medical concerns Body parts bathed by patient: Right arm, Left arm, Chest, Abdomen, Front perineal area, Buttocks, Right upper leg, Left upper leg, Right lower leg, Left lower leg, Face   Body parts bathed by helper: Right arm, Left arm, Chest, Buttocks, Right upper leg, Left upper leg, Right lower leg, Left lower leg, Face     Bathing assist Assist Level: Contact Guard/Touching assist     Upper Body Dressing/Undressing Upper body dressing Upper body dressing/undressing activity did not occur (including orthotics): Safety/medical concerns What is the patient wearing?: Pull over shirt    Upper body assist Assist Level: Supervision/Verbal cueing    Lower Body Dressing/Undressing Lower body dressing    Lower body dressing activity did not occur: Safety/medical concerns What is the patient wearing?: Pants     Lower body assist Assist for lower body dressing: Maximal Assistance - Patient 25 - 49%     Toileting Toileting    Toileting assist Assist for toileting: Minimal Assistance - Patient > 75%     Transfers Chair/bed transfer  Transfers assist  Chair/bed transfer  activity did not occur: Safety/medical concerns (unable due to fatigue)  Chair/bed transfer assist level: Contact Guard/Touching assist Chair/bed transfer assistive device: Programmer, multimedia   Ambulation assist   Ambulation activity did not occur: Safety/medical concerns  Assist level: Contact Guard/Touching assist Assistive device: Walker-rolling Max distance: 133ft   Walk 10 feet activity   Assist     Assist level: 2  helpers Assistive device: Hand held assist   Walk 50 feet activity   Assist Walk 50 feet with 2 turns activity did not occur: Safety/medical concerns         Walk 150 feet activity   Assist Walk 150 feet activity did not occur: Safety/medical concerns         Walk 10 feet on uneven surface  activity   Assist Walk 10 feet on uneven surfaces activity did not occur: Safety/medical concerns         Wheelchair     Assist Is the patient using a wheelchair?: Yes Type of Wheelchair: Manual    Wheelchair assist level: Dependent - Patient 0% Max wheelchair distance: 279ft    Wheelchair 50 feet with 2 turns activity    Assist        Assist Level: Dependent - Patient 0%   Wheelchair 150 feet activity     Assist      Assist Level: Dependent - Patient 0%   Blood pressure (!) 161/84, pulse 100, temperature 98.2 F (36.8 C), temperature source Oral, resp. rate 20, height 6' (1.829 m), weight 85.1 kg, SpO2 94 %.  Medical Problem List and Plan: 1. Functional deficits secondary to multifocal acute/early subacute cortical ischemia in the anterior right MCA territory, likely large vessel source from critical right ICA stenosis.  Status post right ICA stenting.  Left caudate small infarct>> procedure related versus occult atrial fibrillation.             -patient with decline in function d/t poor nutritional status, significant stroke and multiple medical issues.    Continue CIR 15/7 if tolerated             -spoke to family at length regarding his status, current goals of care. He certainly can remain here on rehab until there is further discussion with the family and palliative care on Monday. At this point, we will hold off on PEG placement as well. I recommend trying to maximize his nutrition with TPN and to see if we can increase his arousal as well. Continue NPO. He likely will need to go back to acute ultimately to manage these issues.  -appreciate the input  from palliative care as well as general surgery  2.  Impaired mobility -DVT/anticoagulation:  Pharmaceutical: Lovenox             -antiplatelet therapy: continue Aspirin, hold Brilinta prior to PEG 3. Pain Management:  Left hip pain , resolved  Left shoulder pain -no obvious pain at present   4. Mood: LCSW to evaluate and provide emotional support             -antipsychotic agents: N/A 5. Neuropsych: This patient is capable of making decisions on his own behalf. 6. Skin/Wound Care: Routine skin care checks 7. Fluids/Electrolytes/Nutrition: hypoK + will supplement , give K rider IV x 2 , recheck BMET in am   -1/28 dc megace as he's npo 8.  Bibasilar PNA- afebrile , leukocytosis resolved has persistent Xray abnormalities              --  resolved after course of Unasyn followed by Augmentin            -1/28 add robinul to help with oral pharyngeal secretions 9.  Frequent PVCs: Sees Dr. Stanford Breed as an outpatient  --recommends loop recorder>> placed 1/10 10: Hypertension: monitor off Benicar  Vitals:   03/26/21 1925 03/27/21 0417  BP: (!) 157/88 (!) 161/84  Pulse: (!) 102 100  Resp: 19 20  Temp: 98 F (36.7 C) 98.2 F (36.8 C)  SpO2: 95% 94%  Increased Avapro to 300mg . Added lopressor 12.5mg  BID--no changes today 11: Hyperlipidemia: Zetia and Lipitor 12: EtOH use: advised to drink no more than 1 alcoholic beverage dialy 13: Overweight: BMI = 28.5. Rec: weight loss, diet and exercise as appropriate 14: CKD stage 3a: serum Cr = 0.99. Monitor At baseline  16: Hypokalemia: will need ongoing supplementation. Additional 26meq added 1/21.  17: Anemia: asymptomatic, mild stable  CBC Latest Ref Rng & Units 03/24/2021 03/20/2021 03/19/2021  WBC 4.0 - 10.5 K/uL 9.0 10.3 7.5  Hemoglobin 13.0 - 17.0 g/dL 11.8(L) 11.1(L) 10.1(L)  Hematocrit 39.0 - 52.0 % 36.4(L) 34.4(L) 31.6(L)  Platelets 150 - 400 K/uL 334 359 314  HGB remains stable   18: Thyroid nodule: follow-up ultrasound in one year  recommended 19. Dysphagia: NPO  See #1 21. Eye irritation: eye drops added.  22. Lethargy: has been an ongoing issue , CT head showing no new infarct    -rx frontal and paranasal sinusitis--cefidinir CBC without leukocytosis 23. Transaminitis: Tylenol d/ced, repeat CMP 1/22 shows minimal elevation of AlkP and ALT, AST is normal  24. Tachycardia: added lopressor 12.5mg  BID    Over 55 minutes were spent in examination of patient, assessment of pertinent data,  formulation of a treatment plan, and in discussion with patient and/or family.    LOS: 18 days A FACE TO FACE EVALUATION WAS PERFORMED  [name] 03/27/2021, 1:36 PM

## 2021-03-27 NOTE — Progress Notes (Signed)
Patient resting in bed this AM. Patient medications given as ordered., denies pain at this time. PRN Robitussin 5 ml and Albuterol 2.5 mg/3 ml given as ordered, effective.  Incontinent of urine, brief changed bed linen changed. Mouth care completed x 2, suction provided as needed. Foam lift pad applied to buttock, prevention. Family at bedside.

## 2021-03-27 NOTE — Progress Notes (Signed)
Palliative Medicine Inpatient Follow Up Note  Consulting Provider: Izora Ribas, MD   Reason for consult:   [place] [facility] Consult  Reason for Consult? 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  He underwent diagnostic cerebral angiogram with right carotid stenting and was placed on Ticagrelor 90 mg twice daily and aspirin 81 mg.  He has since been in inpatient rehab where he has had failure to thrive and declined in terms of physical progress. Palliative care was asked to get involved given his ongoing aspiration risk to review with patients family the idea of a gastrostomy tube. This would be a complicated placement as patient has a large hiatal hernia.    Today's Discussion (03/27/2021):  *Please note that this is a verbal dictation therefore any spelling or grammatical errors are due to the "[place]" system interpretation.  Chart reviewed inclusive of progress notes, laboratory results, and diagnostic images.   I met with David [name] and daughter, David [name] this morning. Reviewed our role on Palliative care and again apologized that they had not been informed of our consultation in  advance.   We reviewed that David [name] for the last three days has continued to be incrementally confused, this is something which has not cleared.   Discussed again the ongoing concerns regarding aspiration risk and how this will be ongoing. I shared that I worry David [name] could have an episode which would result in some degree of respiratory distress. I asked if his family felt he would ever wish for cardiopulmonary  resuscitation or mechanical ventilation. David [name] shares that [name] would not want these interventions and he had been forthright with his spouse that he would not wish to be kept alive artificially. I confirmed that this meant he would want a do not resuscitate which his spouse asserted.   Reviewed in brief the idea of a PEG with his hiatal hernia and how this could be quite complicated. I shared that surgery is still in the process of identifying if this would be possible and will need to discuss with family the risk(s) associated with it.   Patients wife and daughter were very receptive to ongoing conversations. We reviewed the anticipation of our meeting on Monday.   Created space and opportunity for patient to explore thoughts feelings and fears regarding current medical situation.  Questions and concerns addressed   Palliative Support Provided _______________________________________ Addendum:  I met again this early evening with patients daughter, David [name] and her sister David [name] as well as David [name]'s spouse, David [name]. We reviewed the plan. They shared that they had been updated by Dr. Naaman [name] and he verbalized similar information to them that I had. They share that they are hoping that continuing the TPN for a few days will provide their father with some energy. Plan for Dr. Naaman [name] to also increase ritalin dosing and stop mirtazapine.  In the meanwhile patients family continues to discuss what is best for their father. They are very clear about not wanting to just "exist" and they do not want him to suffer either.  I expresses that we will remain involved and continue to offer support throughout the trial-some time.  Objective Assessment: Vital Signs Vitals:   03/26/21 1925 03/27/21 0417  BP: (!) 157/88 (!) 161/84  Pulse: (!) 102 100  Resp: 19 20  Temp: 98 F (36.7 C) 98.2 F (36.8 C)  SpO2: 95% 94%    Intake/Output Summary (Last 24 hours) at 03/27/2021 1214 Last data filed at 03/26/2021  1920 Gross per 24 hour  Intake --  Output 200 ml  Net -200 ml   Last Weight  Most recent update: 03/25/2021  5:15 PM    Weight  85.1 kg (187 lb 9.8 oz)            Gen:  Elderly Caucasian M in NAD HEENT: moist mucous membranes CV: Irregular rate and rhythm  PULM: On 1LPM [place], notable cough,  rhonchi ABD: soft/nontender  EXT: No edema  Neuro: Alert and oriented x2 - slow responses   SUMMARY OF RECOMMENDATIONS   DNAR/DNI  Gold DNR placed on Chart  Given patient's severe dysphagia and ongoing aspiration risk the medical team has considered gastrostomy tube placement.  This will be quite complicated given patient's hiatal hernia. Patient is being evaluated by the surgery team.  More importantly, discussions with family circulating around the reality that even with a gastrostomy tube placement aspiration risk we will continue, if not be more enhanced.   Plan for formal family meeting on Monday with David [name], his spouse David [name], their two daughters. I have already requested speech therapy, rehabilitation medicine, and the surgical teams presence to better support the family and the decisions they must make moving forward.   Ongoing palliative care support given his complicated situation  Total Time: 32  MDM - High  Medical Decision Making:4 #/Complex Problems: 4                     Data Reviewed:    4             Management: 4 (1-Straightforward, 2-Low, 3-Moderate, 4-High) ______________________________________________________________________________________ [facility] Team Team Cell Phone: [phone] Please utilize secure chat with additional questions, if there is no response within 30 minutes please call the above phone number  Palliative Medicine Team providers are available by phone from 7am to 7pm daily and can be reached through the team cell phone.  Should this patient require assistance outside of these hours, please call the  patient's attending physician.

## 2021-03-27 NOTE — Consult Note (Signed)
Palliative Medicine Inpatient Consult Note  Consulting Provider: Izora Ribas, MD  Reason for consult:   [facility] Consult  Reason for Consult? 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  He underwent diagnostic cerebral angiogram with right carotid stenting and was placed on Ticagrelor 90 mg twice daily and aspirin 81 mg.  He has since been in inpatient rehab where he has had failure to thrive and declined in terms of physical progress. Palliative care was asked to get involved given his ongoing aspiration risk to review with patients family the idea of a gastrostomy tube. This would be a complicated placement as patient has a large hiatal hernia.    Clinical Assessment/Goals of Care:  *Please note that this is a verbal dictation therefore any spelling or grammatical errors are due to the "[place]" system interpretation.  I have reviewed medical records including EPIC notes, labs and imaging, received report from bedside RN, assessed the patient who was lying in bed in no acute distress.  He was noted to have coughing with thick secretions did not appear to be able to manage the secretions sufficiently for my time at bedside, suction equipment present to utilize.   I called patient's spouse Birney Belshe to further discuss diagnosis prognosis, GOC, EOL wishes, disposition and options.   I introduced Palliative Medicine as specialized medical care for people living with serious illness. It focuses on providing relief from the symptoms and stress of a serious illness. The goal is to  improve quality of life for both the patient and the family.  Vickii Chafe shares that she is familiar with palliative care and she is in a degree of shock that I am calling her this evening.  She expresses to me that she did not realize we were consulted and does not know what we have to offer her.  I again explained that the reason for calling us to have a conversation regarding Sloan's goals moving forward in the setting of his ongoing aspiration risk and his inability to safely swallow.  Medical History Review and Understanding:  Vickii Chafe shares with me that Ronen has had some decline since October when he had an upper respiratory infection which set him back.  Then in December at the end of the month he collapsed in his bed due to influenza A.  It was about a week after that on January 6 that he was identified to have the stroke which required surgical intervention and an ICU stay for 3 days.  A review of Abeer's course at the rehabilitation was had.  Vickii Chafe shares initially the first week he was there it seemed like he was moving forward in terms of mobility and communication.  She shares that the last 5 to 6 days he has been declining which is very distressing to her.  Vickii Chafe expresses that she and her husband have been in fairly good health for the majority of their life.  Peggy states that Aashrith was actually her caregiver as she had a spinal fusion surgery of her L4 and L5 back in October.  We review the sudden events that have gotten Mujahid here and the  difficulty in terms of processing that.  Peggy also mentions that Kalei has a "loop device" to aid in identifying when he goes into atrial fibrillation.  Social History:  Demarques is from [place], [place].  He is married to his wife Clinical cytogeneticist.  They have been wed for 61 years.  They share 2 daughters and 5 grandchildren.  Antionio is identified to been an independent and healthy man.  He is a man of faith and practices within  Christianity.  Functional and Nutritional State:  Prior to his stroke [place]e was fully functional remaining to be doing yard work not using any supplemental aids for mobility.  He was still driving and preparing meals.  His wife shares that he never paid the bills that she has an accounting degree though he was capable of doing all B ADLs and IADLs without any assistance.  Palliative Symptoms:  Dysphagia-working with speech therapy presently a very high aspiration risk and is nothing by mouth.  Ongoing discussions regarding possibility of PEG placement  Advance Directives: A detailed discussion was had today regarding advanced directives.  Have reviewed advanced directives in [place] and Jakob does have a declaration for a natural death.  Code Status: Concepts specific to code status, artifical feeding and hydration, continued IV antibiotics and rehospitalization was had.    In regard to [place] for the time being [place] is and listed as full code.  This will be further broached and additional conversations though given how overwhelmed Vickii Chafe was during our conversation I felt discussing this in greater detail may contribute to her anxiety about his present situation.  I was able to email her a copy of the "Hard Choices for [employer]" booklet which she shares that she will review thoroughly.  I also shared a copy of the MOST form for she and her daughters to review.  Goals for the Future:  Ideally Vickii Chafe shares that she would want Alvar to have a degree of functionality and independence again.  She is just coming to terms with the reality that may be that will not happen in the way she had anticipated.  Discussion:  I shared with Vickii Chafe the concerns of the primary medical team in terms of gastrostomy tube placement given that Lamaj has a large hiatal hernia.  We discussed that this would make his tube placement more complicated.  Furthermore we reviewed in detail Merrik's  dysphagia and ongoing aspiration where skin in the setting of his recent stroke.  We discussed the discoordination and inability he has to recognize safely swallowing secretions and saliva.  We discussed that because of this unfortunately secretions travel to his lungs causing pneumonia.  I shared that even with a gastrostomy tube placement this risks remains to be extremely high.  We discussed that gastrostomy tubes increase ongoing risks of aspiration and do not diminish them.  This information was very shocking to [place] and I could tell she was getting extremely overwhelmed.  We discussed it might be prudent to meet together with her daughters and other medical providers on Monday for further conversation and decisions regarding this topic.  Discussed the importance of continued conversation with family and their  medical providers regarding overall plan of care and treatment options, ensuring decisions are within the context of the patients values and GOCs. __________________________________ Addendum:  Shortly after speaking with Vickii Chafe I received a call from her daughter, Pollie Meyer and Susan's husband Charlie Roche.  They shared with me that Vickii Chafe was processing all of  the information we reviewed.  Manuela Schwartz asked if I may discuss with her what I had discussed with her mother which I was happy to do.  Above information was discussed with Manuela Schwartz and Eduard Clos.  Manuela Schwartz shares that there was a lot of hope for improvement of Kurtiss though they see that this last week has been especially burden some.  We discussed the importance of meeting together for further goals of care conversations and decisions for the future.  I highlighted that our goal on the palliative care team is truly making sure patient's quality of life is the most optimal it can be and to educate patients and families about any avenue that is chosen regarding additional interventions or procedures.  Decision Maker: Javyn Havlin (spouse)  [phone]  SUMMARY OF RECOMMENDATIONS   Full Code for the time being - we will broach this topic further during her meeting as patient does have a declaration in his living will for a natural death.  Given patient's severe dysphagia and ongoing aspiration risk the medical team has considered gastrostomy tube placement.  This will be quite complicated given patient's hiatal hernia. Patient is being evaluated by the surgery team.  More importantly, discussions with family circulating around the reality that even with a gastrostomy tube placement aspiration risk we will continue, if not be more enhanced.  Plan for formal family meeting on Monday with Alontae, his spouse Vickii Chafe, their two daughters. I have already requested speech therapy, rehabilitation medicine, and the surgical teams presence to better support the family and the decisions they must make moving forward.  Ongoing palliative care support given his complicated situation  Code Status/Advance Care Planning: FULL CODE  Palliative Prophylaxis:  Aspiration, Bowel Regimen, Delirium Protocol, Frequent Pain Assessment, Oral Care, Palliative Wound Care, and Turn Reposition  Additional Recommendations (Limitations, Scope, Preferences): Continue present interventions  Psycho-social/Spiritual:  Desire for further Chaplaincy support: Yes - Patient is a [place] Additional Recommendations: Education on dysphagia in the setting of ICA stroke   Prognosis: Unclear though worrisome in the setting of poor progression this last week, insufficient nutrition, and recurring aspiration risk.  Discharge Planning: Unclear.  Vitals:   03/26/21 1925 03/27/21 0417  BP: (!) 157/88 (!) 161/84  Pulse: (!) 102 100  Resp: 19 20  Temp: 98 F (36.7 C) 98.2 F (36.8 C)  SpO2: 95% 94%    Intake/Output Summary (Last 24 hours) at 03/27/2021 1027 Last data filed at 03/26/2021 1920 Gross per 24 hour  Intake --  Output 200 ml  Net -200 ml   Last  Weight  Most recent update: 03/25/2021  5:15 PM    Weight  85.1 kg (187 lb 9.8 oz)            Gen:  Elderly Caucasian M in NAD HEENT: moist mucous membranes CV: Irregular rate and rhythm  PULM: On 1LPM [place], notable cough,  rhonchi ABD: soft/nontender  EXT: No edema  Neuro: Alert and oriented x2 - slow responses   PPS: 30-40%   This conversation/these recommendations were discussed with patient primary care team, Dr. Ranell Patrick  Total Time: 125  MDM High  Medical Decision Making:4 #/Complex Problems:  4                    Data Reviewed: 4                Management:4 (1-Straightforward, 2-Low, 3-Moderate, 4-High) ______________________________________________________ [facility] Palliative Medicine Team Team Cell Phone: [phone] Please utilize secure  chat with additional questions, if there is no response within 30 minutes please call the above phone number  Palliative Medicine Team providers are available by phone from 7am to 7pm daily and can be reached through the team cell phone.  Should this patient require assistance outside of these hours, please call the patient's attending physician.

## 2021-03-27 NOTE — Progress Notes (Signed)
PHARMACY - TOTAL PARENTERAL NUTRITION CONSULT NOTE   Indication:  aspiration risk >>plan PEG  Patient Measurements: Height: 6' (182.9 cm) Weight: 85.1 kg (187 lb 9.8 oz) IBW/kg (Calculated) : 77.6 TPN AdjBW (KG): 85.1 Body mass index is 25.44 kg/m. Usual Weight:   Assessment: s/p MCA infarct s/p R ICA stenting for stenosis. Left caudate small infarct>> procedure related versus occult atrial fibrillation. BMI = 28.5. Start TPN due to con't NPO status with high aspiration risk and decreased levels of energy with improper nutrition..   Pulled out NGT 1/24. Previous Cortrak unable to be advanced due to large [place]. Barriers to PEG placement: CT scan of the abdomen and pelvis to assess hiatal hernia and abdominal anatomy has been ordered.  PMH: CAD, h/o prostate cancer, PNA, R carotid stenosis, Flu A, frequent PVCs, HTN, HLD, thyroid nodule, renal oncocytoma of right kidney in 2014   Glucose / Insulin: No noted h/o DM but now A1C 6.3. CBGs 88-137 (2 units) Electrolytes: Na 139, K 3.7, Bicarb 23, Ca 10.3, Phos 2 low, Mg 2, Renal: Scr <1 Hepatic: AST/ALT 25/31, Alkphos 130, Tbili 0.6.Albumin 2.6, Trigl 88 - Megace 400mg  BID, po PPI 40mg  BID,  Intake / Output; MIVF: 1/2 NS at 79ml/hr - UOP unmeasured - LBM: 1/20  GI Imaging:  - 1/24: CT of the head no new infarct , + paranasal sinusitis  - 1/27 CT: There is significant interval increase in size of hiatal hernia. Possible organo axial volvulus of stomach within the hiatal hernia. There is no evidence of intestinal obstruction or pneumoperitoneum.  GI Surgeries / Procedures: none for abdomen.  Central access: PICC 1/26 TPN start date: 1/27  Nutritional Goals: Goal TPN rate is 82 mL/hr (provides 108 g of protein and 2129 kcals per day)  RD Assessment: Last 03/23/21 Estimated Needs Total Energy Estimated Needs: 2100-2300 Total Protein Estimated Needs: 105-120 grams Total Fluid Estimated Needs: >/= 2.1 L  Current Nutrition:   NPO  Plan:  Increase TPN at 60 mL/hr (goal 82) at 1800 will provide 79g protein and 1558 kcal Electrolytes in TPN: Na 67mEq/L, K 72mEq/L, Ca 43mEq/L, Mg 42mEq/L, and Phos 65mmol/L. Cl:Ac 1:1 Add standard MVI and trace elements to TPN Initiate Moderate q6h SSI and adjust as needed  Reduce MIVF to kvo Monitor TPN labs on Mon/Thurs, PRN  Kphos 40mmol IV x 1 today Plan in-person palliative care discussion Monday prior to decision on high-risk PEG.   Audery Wassenaar S. Alford Highland, PharmD, BCPS Clinical Staff Pharmacist [email or web address] Alford Highland, Truly Stankiewicz Stillinger 03/27/2021,7:23 AM

## 2021-03-28 DIAGNOSIS — Z66 Do not resuscitate: Secondary | ICD-10-CM

## 2021-03-28 LAB — BASIC METABOLIC PANEL
Anion gap: 10 (ref 5–15)
BUN: 13 mg/dL (ref 8–23)
CO2: 21 mmol/L — ABNORMAL LOW (ref 22–32)
Calcium: 10.6 mg/dL — ABNORMAL HIGH (ref 8.9–10.3)
Chloride: 109 mmol/L (ref 98–111)
Creatinine, Ser: 0.79 mg/dL (ref 0.61–1.24)
GFR, Estimated: >60 mL/min (ref >60)
Glucose, Bld: 105 mg/dL — ABNORMAL HIGH (ref 70–99)
Potassium: 4 mmol/L (ref 3.5–5.1)
Sodium: 140 mmol/L (ref 135–145)

## 2021-03-28 LAB — PHOSPHORUS: Phosphorus: 3.5 mg/dL (ref 2.5–4.6)

## 2021-03-28 LAB — GLUCOSE, CAPILLARY
Glucose-Capillary: 117 mg/dL — ABNORMAL HIGH (ref 70–99)
Glucose-Capillary: 140 mg/dL — ABNORMAL HIGH (ref 70–99)

## 2021-03-28 MED ORDER — ONDANSETRON HCL 4 MG/2ML IJ SOLN: 4.0000 mg | Freq: Four times a day (QID) | INTRAMUSCULAR | Status: DC | PRN

## 2021-03-28 MED ORDER — SODIUM CHLORIDE 0.9 % IV SOLN
1.0000 mg/h | INTRAVENOUS | Status: DC
  Administered 2021-03-28: 1 mg/h via INTRAVENOUS
  Filled 2021-03-28 (×2): qty 2.5

## 2021-03-28 MED ORDER — BIOTENE DRY MOUTH MT LIQD: 15.0000 mL | OROMUCOSAL | Status: DC | PRN

## 2021-03-28 MED ORDER — TRAVASOL 10 % IV SOLN
INTRAVENOUS | Status: DC
  Filled 2021-03-28: qty 1082.4

## 2021-03-28 MED ORDER — HALOPERIDOL LACTATE 5 MG/ML IJ SOLN
0.5000 mg | INTRAMUSCULAR | Status: DC | PRN
  Filled 2021-03-28: qty 0.1

## 2021-03-28 MED ORDER — ATROPINE SULFATE 1 % OP SOLN
2.0000 [drp] | Freq: Four times a day (QID) | OPHTHALMIC | Status: DC
  Administered 2021-03-28 (×2): 2 [drp] via SUBLINGUAL
  Filled 2021-03-28: qty 2

## 2021-03-28 MED ORDER — POLYVINYL ALCOHOL 1.4 % OP SOLN
1.0000 [drp] | Freq: Four times a day (QID) | OPHTHALMIC | Status: DC | PRN
  Filled 2021-03-28: qty 15

## 2021-03-28 MED ORDER — DIPHENHYDRAMINE HCL 50 MG/ML IJ SOLN
25.0000 mg | Freq: Four times a day (QID) | INTRAMUSCULAR | Status: DC | PRN
  Filled 2021-03-28: qty 0.5

## 2021-03-28 MED ORDER — ONDANSETRON 4 MG PO TBDP: 4.0000 mg | ORAL_TABLET | Freq: Four times a day (QID) | ORAL | Status: DC | PRN

## 2021-03-28 MED ORDER — ACETAMINOPHEN 325 MG PO TABS: 650.0000 mg | ORAL_TABLET | Freq: Four times a day (QID) | ORAL | Status: DC | PRN

## 2021-03-28 MED ORDER — HALOPERIDOL LACTATE 2 MG/ML PO CONC
0.5000 mg | ORAL | Status: DC | PRN
  Filled 2021-03-28: qty 0.3

## 2021-03-28 MED ORDER — MORPHINE SULFATE 10 MG/5ML PO SOLN
5.0000 mg | ORAL | Status: DC | PRN
  Administered 2021-03-28 (×2): 5 mg via ORAL
  Filled 2021-03-28 (×2): qty 5

## 2021-03-28 MED ORDER — HYDROMORPHONE BOLUS VIA INFUSION
0.5000 mg | INTRAVENOUS | Status: DC | PRN
Start: 2021-03-28 — End: 2021-03-29
  Administered 2021-03-28 (×3): 0.5 mg via INTRAVENOUS
  Filled 2021-03-28: qty 1

## 2021-03-28 MED ORDER — LORAZEPAM 1 MG PO TABS
1.0000 mg | ORAL_TABLET | ORAL | Status: DC
  Administered 2021-03-28 (×3): 1 mg via ORAL
  Filled 2021-03-28 (×3): qty 1

## 2021-03-28 MED ORDER — ACETAMINOPHEN 650 MG RE SUPP: 650.0000 mg | Freq: Four times a day (QID) | RECTAL | Status: DC | PRN

## 2021-03-28 MED ORDER — HALOPERIDOL 0.5 MG PO TABS
0.5000 mg | ORAL_TABLET | ORAL | Status: DC | PRN
  Filled 2021-03-28: qty 1

## 2021-03-31 NOTE — Progress Notes (Signed)
Inpatient Rehabilitation Care Coordinator Discharge Note   Patient Details  Name: David [name] MRN: [id number] Date of Birth: 22-Mar-1940   Discharge location: Expired  Length of Stay:    Discharge activity level:    Home/community participation:    Patient response [id number] Literacy - How often do you need to have someone help you when you read instructions, pamphlets, or other written material from your doctor or pharmacy?: Patient unable to respond  Patient response EH:[id number] Isolation - How often do you feel lonely or isolated from those around you?: Patient unable to respond  Services provided included:    Financial Services:       Choices offered to/list presented to:    Follow-up services arranged:              Patient response to transportation need: Is the patient able to respond to transportation needs?: Yes In the past 12 months, has lack of transportation kept you from medical appointments or from getting medications?: No In the past 12 months, has lack of transportation kept you from meetings, work, or from getting things needed for daily living?: No    Comments (or additional information):  Patient/Family verbalized understanding of follow-up arrangements:     Individual responsible for coordination of the follow-up plan:    Confirmed correct DME delivered: Dyanne Iha 03/29/2021    Dyanne Iha

## 2021-03-31 NOTE — Progress Notes (Signed)
Patient resting in bed. Continue to reposition patient q 2 hours and PRN. Patient orders changed to comfort measures, and therapy D/C. Dilaudid started per order. Palliative Nurse Sharyn Lull at bed side adjusting Dilaudid dose to 4 mg/hr and bolus 0.5 mg every 15 minutes. Bolus given x 2. Morphine 5 mg PRN given as ordered. Patient can become restless at times. Immediate family at bedside. 0.9 % continues at 10 ml/hr. Mouth care continues to be provided as needed. Family understands to notify nurse if patient becomes more restless, moaning or agitated so nurse can administer PRN pain medications.

## 2021-03-31 NOTE — Progress Notes (Signed)
PROGRESS NOTE   Subjective/Complaints: Nurse reports he had a reasonable night. Feels that he's in some pain especially when they're adjusting him in bed. Pt very slow to awaken this morning. Didn't seem to indicate he's in pain  ROS: Limited due to cognitive/behavioral       Objective:   CT ABDOMEN PELVIS W CONTRAST  Result Date: 03/26/2021 CLINICAL DATA:  Evaluate hiatal hernia EXAM: CT ABDOMEN AND PELVIS WITH CONTRAST TECHNIQUE: Multidetector CT imaging of the abdomen and pelvis was performed using the standard protocol following bolus administration of intravenous contrast. RADIATION DOSE REDUCTION: This exam was performed according to the departmental dose-optimization program which includes automated exposure control, adjustment of the mA and/or kV according to patient size and/or use of iterative reconstruction technique. CONTRAST:  34mL OMNIPAQUE IOHEXOL 350 MG/ML SOLN COMPARISON:  08/14/2017 FINDINGS: Lower chest: There are new patchy infiltrates in the both lower lung fields. There is small left pleural effusion. There is minimal right pleural effusion. Coronary artery calcifications are seen. Hepatobiliary: No focal abnormality is seen in the liver. Gallbladder is unremarkable. Pancreas: No focal abnormality is seen Spleen: Unremarkable. Adrenals/Urinary Tract: Adrenals are not enlarged. There is no hydronephrosis. There is 8 mm calculus in the upper pole of left kidney. There is 2 mm calculus in the lower pole of left kidney. There are few low-density lesions in the renal cortex largest measuring 2.6 cm in the anterior midportion of left kidney. Ureters are unremarkable. Urinary bladder is unremarkable. Stomach/Bowel: There is significant interval increase in size of hiatal hernia. Possible organo axial volvulus of the stomach in the hiatal hernia. Small bowel loops are not dilated. Appendix is not distinctly seen. Cecum appears more  medial than usual in position. There is no significant wall thickening in colon. There is no pericolic stranding. Vascular/Lymphatic: There are scattered arterial calcifications. Reproductive: Prostate is smaller than usual with central low attenuation suggesting possible transurethral resection. Other: There is no ascites or pneumoperitoneum. Small bilateral inguinal hernias containing fat are noted. Musculoskeletal: Degenerative changes are noted in the lumbar spine, more severe at L4-L5 level. IMPRESSION: There is significant interval increase in size of hiatal hernia. Possible organo axial volvulus of stomach within the hiatal hernia. There is no evidence of intestinal obstruction or pneumoperitoneum. There is no hydronephrosis. There are new patchy infiltrates in both lower lung fields suggesting atelectasis/pneumonia. Small left pleural effusion. Minimal right pleural effusion. Coronary artery calcifications are seen. There are 2 nonobstructing left renal stones. There are multiple renal cysts. Other findings as described in the body of the report. Electronically Signed   By: Elmer Picker M.D.   On: 03/26/2021 14:57   No results for input(s): WBC, HGB, HCT, PLT in the last 72 hours.  Recent Labs    03/27/21 0258 April 03, 2021 0235  NA 139 140  K 3.7 4.0  CL 109 109  CO2 23 21*  GLUCOSE 127* 105*  BUN 12 13  CREATININE 0.87 0.79  CALCIUM 10.3 10.6*     Intake/Output Summary (Last 24 hours) at 04/03/21 0818 Last data filed at April 03, 2021 0300 Gross per 24 hour  Intake 1307.43 ml  Output --  Net 1307.43 ml  Physical Exam: Vital Signs Blood pressure 136/81, pulse 65, temperature 98 F (36.7 C), resp. rate 14, height 6' (1.829 m), weight 85.1 kg, SpO2 94 %.  Constitutional: ill appearing. Vital signs reviewed. HEENT: NCAT, EOMI, oral membranes dry Neck: supple Cardiovascular: RRR without murmur. No JVD    Respiratory/Chest: CTA Bilaterally without wheezes or rales. Normal  effort    GI/Abdomen: BS +, non-tender, non-distended Ext: no clubbing, cyanosis, or edema Psych:  pt flat, does attempt to interact Skin: intact, loop/line sites intact Neuro: pt very lethargic initially. Did perk up a bit. Still very dysarthric.  Poor oro-motor control. Can't participate in MMT Musculoskeletal: no focal jt/limb pain at rest     Assessment/Plan: 1. Functional deficits which require 3+ hours per day of interdisciplinary therapy in a comprehensive inpatient rehab setting. Physiatrist is providing close team supervision and 24 hour management of active medical problems listed below. Physiatrist and rehab team continue to assess barriers to discharge/monitor patient progress toward functional and medical goals  Care Tool:  Bathing  Bathing activity did not occur: Safety/medical concerns Body parts bathed by patient: Right arm, Left arm, Chest, Abdomen, Front perineal area, Buttocks, Right upper leg, Left upper leg, Right lower leg, Left lower leg, Face   Body parts bathed by helper: Right arm, Left arm, Chest, Buttocks, Right upper leg, Left upper leg, Right lower leg, Left lower leg, Face     Bathing assist Assist Level: Contact Guard/Touching assist     Upper Body Dressing/Undressing Upper body dressing Upper body dressing/undressing activity did not occur (including orthotics): Safety/medical concerns What is the patient wearing?: Pull over shirt    Upper body assist Assist Level: Supervision/Verbal cueing    Lower Body Dressing/Undressing Lower body dressing    Lower body dressing activity did not occur: Safety/medical concerns What is the patient wearing?: Pants     Lower body assist Assist for lower body dressing: Maximal Assistance - Patient 25 - 49%     Toileting Toileting    Toileting assist Assist for toileting: Minimal Assistance - Patient > 75%     Transfers Chair/bed transfer  Transfers assist  Chair/bed transfer activity did not occur:  Safety/medical concerns (unable due to fatigue)  Chair/bed transfer assist level: Contact Guard/Touching assist Chair/bed transfer assistive device: Programmer, multimedia   Ambulation assist   Ambulation activity did not occur: Safety/medical concerns  Assist level: Contact Guard/Touching assist Assistive device: Walker-rolling Max distance: 176ft   Walk 10 feet activity   Assist     Assist level: 2 helpers Assistive device: Hand held assist   Walk 50 feet activity   Assist Walk 50 feet with 2 turns activity did not occur: Safety/medical concerns         Walk 150 feet activity   Assist Walk 150 feet activity did not occur: Safety/medical concerns         Walk 10 feet on uneven surface  activity   Assist Walk 10 feet on uneven surfaces activity did not occur: Safety/medical concerns         Wheelchair     Assist Is the patient using a wheelchair?: Yes Type of Wheelchair: Manual    Wheelchair assist level: Dependent - Patient 0% Max wheelchair distance: 24ft    Wheelchair 50 feet with 2 turns activity    Assist        Assist Level: Dependent - Patient 0%   Wheelchair 150 feet activity     Assist  Assist Level: Dependent - Patient 0%   Blood pressure 136/81, pulse 65, temperature 98 F (36.7 C), resp. rate 14, height 6' (1.829 m), weight 85.1 kg, SpO2 94 %.  Medical Problem List and Plan: 1. Functional deficits secondary to multifocal acute/early subacute cortical ischemia in the anterior right MCA territory, likely large vessel source from critical right ICA stenosis.  Status post right ICA stenting.  Left caudate small infarct>> procedure related versus occult atrial fibrillation.             -patient with decline in function d/t poor nutritional status, significant stroke and multiple medical issues.    PLACED PT ON BEDREST GIVEN HIS CLINICAL STATUS            1/29 -spoke to family at length regarding his  status, current goals of care on 1/28.    -he appears more lethargic today   -I placed him on bedrest. Likely will need transfer back to acute tomorrow.  -continue TPN and supportive care today  -continue NPO except for meds (not sure how much longer we'll be able to give meds PO honestly)  -family conference Monday with palliative care to discuss [place]  -he doesn't appear appropriate for a PEG given his CT scan results and clinical appearance.  -continue ritalin to see if we can perk him up at all -appreciate the input from palliative care as well as general surgery  2.  Impaired mobility -DVT/anticoagulation:  Pharmaceutical: Lovenox             -antiplatelet therapy: continue Aspirin, hold Brilinta prior to PEG 3. Pain Management:  Tramadol/tylenol for hip/shoulder pain  4. Mood: LCSW to evaluate and provide emotional support             -antipsychotic agents: N/A 5. Neuropsych: This patient is capable of making decisions on his own behalf. 6. Skin/Wound Care: Routine skin care checks 7. Fluids/Electrolytes/Nutrition: hypoK + will supplement , give K rider IV x 2 , recheck BMET in am   -1/28 dc'ed megace as he's npo 8.  Bibasilar PNA- afebrile , leukocytosis resolved has persistent Xray abnormalities              -- resolved after course of Unasyn followed by Augmentin            -1/28 added robinul to help with oral pharyngeal secretions 9.  Frequent PVCs: Sees Dr. Stanford Breed as an outpatient  --recommends loop recorder>> placed 1/10 10: Hypertension: monitor off Benicar  Vitals:   03/27/21 2030 2021/04/11 0442  BP: 140/76 136/81  Pulse:  65  Resp:  14  Temp:  98 F (36.7 C)  SpO2:  94%  Increased Avapro to 300mg . Added lopressor 12.5mg  BID--no changes today 11: Hyperlipidemia: Zetia and Lipitor 12: EtOH use: advised to drink no more than 1 alcoholic beverage dialy 13: Overweight: BMI = 28.5. Rec: weight loss, diet and exercise as appropriate 14: CKD stage 3a: serum Cr = 0.99.  Monitor At baseline  16: Hypokalemia: will need ongoing supplementation. Additional 48meq added 1/21.  17: Anemia: asymptomatic, mild stable  CBC Latest Ref Rng & Units 03/24/2021 03/20/2021 03/19/2021  WBC 4.0 - 10.5 K/uL 9.0 10.3 7.5  Hemoglobin 13.0 - 17.0 g/dL 11.8(L) 11.1(L) 10.1(L)  Hematocrit 39.0 - 52.0 % 36.4(L) 34.4(L) 31.6(L)  Platelets 150 - 400 K/uL [phone]  HGB remains stable   18: Thyroid nodule: follow-up ultrasound in one year recommended 19. Dysphagia: NPO  See #1 21. Eye irritation: eye  drops added.  22. Lethargy: has been an ongoing issue , CT head showing no new infarct    -rx frontal and paranasal sinusitis--cefidinir CBC without leukocytosis 23. Transaminitis: Tylenol d/ced, repeat CMP 1/22 shows minimal elevation of AlkP and ALT, AST is normal  24. Tachycardia: added lopressor 12.5mg  BID    Over 50 minutes were spent in examination of patient, assessment of pertinent data,  formulation of a treatment plan, and in discussion with patient, treatment team and  family.    LOS: 19 days A FACE TO FACE EVALUATION WAS PERFORMED  [name] 09-Apr-2021, 8:18 AM

## 2021-03-31 NOTE — Progress Notes (Signed)
Wasted remaining IV dilaudid, 10.74ml in stericycle. Witnessed by [place], Therapist, sports.  Full bag returned to pharmacy by this RN. Patrici Ranks A

## 2021-03-31 NOTE — Progress Notes (Signed)
Chaplain responded to request from nurse.  To provide presence and comfort.  Family in distress at end of life. Chaplain came and bedside were his wife, two daughters, their husbands.  Chaplain offered service of prayer and scripture, as patient is nearing death.  Family joined in saying Psalm 22 and Lord's prayer with chaplain.  Rev. Tamsen Snider Pager [phone]

## 2021-03-31 NOTE — Progress Notes (Signed)
Nurse called into the room by family members at 48. Upon arriving to patients room V/S taken No respirations, no, pulse, and no B/P.

## 2021-03-31 NOTE — Progress Notes (Incomplete)
Patient resting in bed. Patient moaning with or without movement. Patient states throat hurts, attempted to give Robitussin however patient is not swallowing. PRN Ultram and Albuterol given. Patient bed brief changed, complete bed change with hypoallergenic bedding. Family approved to have shirt cut off and gown placed on for comfort.

## 2021-03-31 NOTE — Death Summary Note (Signed)
DEATH SUMMARY   Patient Details  Name: David [name] MRN: [id number] DOB: [birth year] EXH:[id number], [name], MD  Admission/Discharge Information   Admit Date:  2021/04/04  Date of Death: Date of Death: 2021-04-23  Time of Death: Time of Death: [birth year], 1897  Length of Stay: 2022/05/14   Principle Cause of death: respiratory failure; functional decline due to stroke, poor nutritional status, multiple medical issues   Hospital Diagnoses: Principal Problem:   Acute right MCA stroke ([place]) Active Problems:   Protein-calorie malnutrition, severe  Acute right MCA stroke ([place]) Functional deficits secondary to stroke Active problems: Pneumonia Cough Lethargy PVCs Hypertension Hyperlipidemia Alcohol use Overweight CKD stage 3a Anemia Thyroid nodule Dysphagia Left trochanteric bursitis UTI   Brief HPI:   David [name] is a 81 y.o. male who presented to [facility] emergency department on 03/05/2021 with left-sided weakness, dysarthria and visual neglect.  Imaging revealed right ICA occlusion.  Code IR was activated and he underwent diagnostic cerebral angiogram with right carotid stenting.  Ticagrelor 90 mg twice daily and aspirin 81 mg daily were initiated.  MRI of the brain showed right frontal cortical small infarcts and left CR small infarct.  He developed persisted cough and leukocytosis.  He tested positive for influenza A and completed a course of Tamiflu.  Chest x-ray showed bilateral infiltrates concerning for multifocal pneumonia.  Unasyn was started for total of 7 days.   Telemetry showed frequent PVCs but no atrial fibrillation.  Dr. Stanford Breed recommended placement of loop recorder and this was performed on 04/04/22.  Hospital Course:  David [name] was admitted to rehab 04/04/2021 for inpatient therapies to consist of PT, ST and OT at least three hours five days a week. Past admission physiatrist, therapy team and rehab RN have worked together to provide customized collaborative  inpatient rehab.  Mild elevation of transaminases on 1/6. Tylenol held and LFTs montiored.  Mild oozing from loop recorder site after placement which resolved with pressure dressing. Unasyn continued. Complained of left lateral hip pain at night and exam consistent with left trochanteric bursitis. Lidoderm patch ordered.  On 1/12 he was noted to have poor oral intake andue to history of chronic kidney disease was administered normal saline 250 cc. Cough improved and repeat chest x-ray performed 03/16/2021 without acute change, but persistent basilar opacitites.  Unasyn discontinued on 1/18 and Augmentin orally for 7 days started. Poor PO intake noted and started on Boost on 1/19.  Developed left shoulder pain on and given Tramadol. Some degree of increasing fatigue versus lethargy on 1/23. Tramadol held. Megace started to improve appetite and PO intake. Interval CT head, CXR, IVFs and schedule HHN on 1/24. Repeat MBS performed on 1/24>>diet downgraded to dysphagia 1, honey thick consistencies, but did not feel patient could be consistent enough to protect airway. NPO status ordered, NGT placement and gentle IVF hydration intiated. UA revealed positive nitrite and few bacteria. Ketones present.  Chest x-ray without acute changes. Bilateral airspace opacities as on previous exam. CT head without acute intracranial hemorrhage. Small infarcts identified on previous MRI not well seen. Sinusitis suspected. Cefdinir BID started. Patient removed his NGT later and rocephin was given IV in place of cefdinir dose. Follow-up AM labs revealed hypokalemia and supplemental dose was increased. He remained afebrile. WBC normal.  Cortrak unable to pass hiatal hernia with fluorsoscopic  guidance. Meds, ice chips only PO. IR consulted and not able to place g-tube due to large hiatal hernia.  His lethargy progressed and he was  less able to participate in therapy sessions.  PICC line placed and TPN started 1/27. General surgery consulted  for placement of enteral tube.  A CT scan of the abdomen was performed with IV contrast to further delineate hiatal hernia and abdominal anatomy.  Significant interval increase in size of hiatal hernia with possible organoaxial volvulus of the stomach within the hiatal hernia was noted.  Discussion held with family regarding further intervention, risks and patient discomfort.  Palliative care consultation initiated.  Further discussion with general surgery and family, patient regarding placement of enteral tube and perioperative risks.  Decision made on 1/28 to hold off on PEG placement.  Patient placed on bedrest on 1/29 given his clinical status and discussed with family at length regarding his status and current goals of care.  Gold DNR placed on chart. He was more lethargic with [place] respirations. Family desired comfort care and stopping all unnecessary measures such as cardiac monitoring, blood draws, needle sticks and frequent vital signs. Hydromorphone infusion started. No respirations, no pulse or blood pressure at 1900 on April 15, 2021.      Procedures: none  Consultations: IR, general surgery, palliative care  The results of significant diagnostics from this hospitalization (including imaging, microbiology, ancillary and laboratory) are listed below for reference.   Significant Diagnostic Studies: CT ANGIO HEAD NECK W WO CM  Result Date: 03/05/2021 CLINICAL DATA:  Provided history: Neuro deficit, acute, stroke suspected. Left-sided weakness. EXAM: CT ANGIOGRAPHY HEAD AND NECK TECHNIQUE: Multidetector CT imaging of the head and neck was performed using the standard protocol during bolus administration of intravenous contrast. Multiplanar CT image reconstructions and MIPs were obtained to evaluate the vascular anatomy. Carotid stenosis measurements (when applicable) are obtained utilizing NASCET criteria, using the distal internal carotid diameter as the denominator. CONTRAST:  76m  OMNIPAQUE IOHEXOL 350 MG/ML SOLN COMPARISON:  Noncontrast head CT performed earlier today 03/05/2021. Thyroid ultrasound 02/27/2020. FINDINGS: CTA NECK FINDINGS Aortic arch: Standard aortic branching. Atherosclerotic plaque within the visualized aortic arch and proximal major branch vessels of the neck. Atherosclerotic plaque within the visualized aortic arch and proximal major branch vessels of the neck. No hemodynamically significant innominate or proximal subclavian artery stenosis. Right carotid system: CCA and ICA patent within the neck. Prominent irregular hypodensity within the proximal right ICA. A portion of this likely reflects soft plaque. However, the irregular portion is highly suspicious for superimposed thrombus/unstable plaque (for instance as seen on series 9, image 61). Resultant severe stenosis of the proximal ICA estimated to be 80% (series 7, image 327). Minimal calcified plaque is also present about the carotid bifurcation. Left carotid system: CCA and ICA patent within neck without stenosis. Minimal atherosclerotic plaque about the carotid bifurcation and within the proximal ICA. Vertebral arteries: Vertebral arteries patent within the neck. Nonstenotic plaque at the origin of both vessels. The left vertebral artery is dominant. Skeleton: Partially imaged thoracic dextrocurvature. Cervical and upper thoracic spondylosis. Multilevel bridging ventral osteophytes within the imaged upper thoracic spine. No acute bony abnormality or aggressive osseous lesion. Other neck: No cervical lymphadenopathy. 3 cm exophytic nodule extending inferiorly from the right thyroid lobe in the right paratracheal region (for instance as seen on series 7, image 288). 8 mm nodule within the inferior right thyroid lobe. Upper chest: No consolidation within the imaged lung apices. Review of the MIP images confirms the above findings CTA HEAD FINDINGS Anterior circulation: The intracranial internal carotid arteries are  patent. Atherosclerotic plaque within both vessels. Moderate stenosis of the paraclinoid right ICA.  Mild stenosis of the paraclinoid left ICA. The M1 middle cerebral arteries are patent. No M2 proximal branch occlusion is identified. Atherosclerotic irregularity of the M2 and more distal MCA vessels, bilaterally. Most notably, there is an apparent moderate/severe stenosis within superior division mid M2 right MCA vessel (series 12, image 17). The anterior cerebral arteries are patent. Hypoplastic left A1 segment. Atherosclerotic irregularity of both anterior cerebral arteries. Most notably, there is a moderate stenosis within the A4 left ACA. No intracranial aneurysm is identified. Posterior circulation: The intracranial vertebral arteries are patent. The basilar artery is patent. The posterior cerebral arteries are patent. Posterior communicating arteries are diminutive or absent bilaterally. Venous sinuses: Within the limitations of contrast timing, no convincing thrombus. Anatomic variants: As described. Review of the MIP images confirms the above findings CTA neck impression #1 and CTA head impressions #1 and #3 called by telephone at the time of interpretation on 03/05/2021 at 2:42 pm to provider Dr. Quinn Axe, who verbally acknowledged these results. IMPRESSION: CTA neck: 1. Prominent irregular hypodensity within the proximal right ICA. A portion of this hypodensity likely reflects soft plaque. However, the irregular portion is highly suspicious for superimposed thrombus (or unstable plaque). Resultant severe stenosis of the proximal right ICA estimated to be 80%. 2. The left common carotid, left internal carotid and bilateral vertebral arteries are patent within the neck without significant stenosis. Mild atherosclerotic plaque within these vessels, as described. 3. 3 cm exophytic nodule extending inferiorly from the right thyroid lobe. This nodule was not described on the prior thyroid ultrasound of 02/27/2020. A  repeat thyroid ultrasound is recommended to further characterize this nodule. 4.  Aortic Atherosclerosis (ICD10-I70.0). CTA head: 1. No intracranial large vessel occlusion is identified. 2. Intracranial atherosclerotic disease with multifocal stenoses, most notably as follows. 3. Moderate/severe stenosis within a superior division mid M2 right MCA vessel. 4. Moderate stenosis within the A4 left anterior cerebral artery. 5. Moderate stenosis of the paraclinoid right ICA. Electronically Signed   By: Kellie Simmering D.O.   On: 03/05/2021 15:10   DG Chest 2 View  Result Date: 03/23/2021 CLINICAL DATA:  Cough EXAM: CHEST - 2 VIEW COMPARISON:  03/20/2021 FINDINGS: Large hiatal hernia. Heart is normal size. Patchy bilateral airspace disease, similar to prior study. No visible effusions or pneumothorax. No acute bony abnormality. NG tube appears to be in place with the tip in the mid cervical region. Recommend clinical correlation. IMPRESSION: There appears to be and NG tube in place with the tip in the mid cervical region. Recommend clinical correlation. Stable patchy bilateral airspace disease. Electronically Signed   By: Rolm Baptise M.D.   On: 03/23/2021 18:13   DG Abd 1 View  Result Date: 03/23/2021 CLINICAL DATA:  Nasogastric tube placement EXAM: ABDOMEN - 1 VIEW COMPARISON:  CT 06/07/2019 FINDINGS: Nasogastric tube tip appears positioned at the gastroesophageal junction, likely within the moderate hiatal hernia. Patchy pulmonary infiltrates are noted bilaterally. Normal abdominal gas pattern. IMPRESSION: Nasogastric tube tip position within the moderate hiatal hernia. Electronically Signed   By: Fidela Salisbury M.D.   On: 03/23/2021 21:24   DG Abd 1 View  Result Date: 03/23/2021 CLINICAL DATA:  NG placement. EXAM: ABDOMEN - 1 VIEW COMPARISON:  Earlier radiograph dated 03/23/2021. FINDINGS: An enteric tube is not visualized. Bilateral airspace opacities similar to prior radiograph. No pleural effusion  pneumothorax. Stable cardiac silhouette. Atherosclerotic calcification of the aorta. No acute osseous pathology. IMPRESSION: 1. Enteric tube is not visualized. 2. Bilateral airspace opacities similar to  prior radiograph. Electronically Signed   By: Anner Crete M.D.   On: 03/23/2021 20:12   CT HEAD WO CONTRAST (5MM)  Result Date: 03/23/2021 CLINICAL DATA:  Stroke, follow up EXAM: CT HEAD WITHOUT CONTRAST TECHNIQUE: Contiguous axial images were obtained from the base of the skull through the vertex without intravenous contrast. RADIATION DOSE REDUCTION: This exam was performed according to the departmental dose-optimization program which includes automated exposure control, adjustment of the mA and/or kV according to patient size and/or use of iterative reconstruction technique. COMPARISON:  Recent CT and MR imaging FINDINGS: Brain: There is no acute intracranial hemorrhage, mass effect, or edema. Gray-white differentiation is preserved. Small areas of infarction on the prior brain MRI are not well seen. There is no extra-axial fluid collection. Ventricles and sulci are within normal limits in size and configuration. Vascular: There is atherosclerotic calcification at the skull base. Skull: Calvarium is unremarkable. Sinuses/Orbits: Paranasal sinus mucosal thickening throughout with air-fluid levels. Bilateral lens replacements. Other: None. IMPRESSION: No acute intracranial hemorrhage. The small infarcts on MRI are not well seen. Nonspecific paranasal sinus inflammatory changes. Acute sinusitis is possible in the appropriate setting. Electronically Signed   By: Macy Mis M.D.   On: 03/23/2021 18:25   MR BRAIN WO CONTRAST  Result Date: 03/05/2021 CLINICAL DATA:  Left-sided weakness EXAM: MRI HEAD WITHOUT CONTRAST TECHNIQUE: Multiplanar, multiecho pulse sequences of the brain and surrounding structures were obtained without intravenous contrast. COMPARISON:  None. FINDINGS: Brain: Multifocal acute  cortical ischemia in the anterior right MCA territory. Small focus of acute/subacute ischemia at the left caudate head. No acute or chronic hemorrhage. There is multifocal hyperintense T2-weighted signal within the white matter. Parenchymal volume and CSF spaces are normal. The midline structures are normal. Vascular: Major flow voids are preserved. Skull and upper cervical spine: Normal calvarium and skull base. Visualized upper cervical spine and soft tissues are normal. Sinuses/Orbits:No paranasal sinus fluid levels or advanced mucosal thickening. No mastoid or middle ear effusion. Normal orbits. IMPRESSION: 1. Multifocal acute/early subacute cortical ischemia in the anterior right MCA territory. No hemorrhage or mass effect. 2. Small focus of acute/early subacute ischemia at the left caudate head. Electronically Signed   By: Ulyses Jarred M.D.   On: 03/05/2021 21:47   CT ABDOMEN PELVIS W CONTRAST  Result Date: 03/26/2021 CLINICAL DATA:  Evaluate hiatal hernia EXAM: CT ABDOMEN AND PELVIS WITH CONTRAST TECHNIQUE: Multidetector CT imaging of the abdomen and pelvis was performed using the standard protocol following bolus administration of intravenous contrast. RADIATION DOSE REDUCTION: This exam was performed according to the departmental dose-optimization program which includes automated exposure control, adjustment of the mA and/or kV according to patient size and/or use of iterative reconstruction technique. CONTRAST:  40m OMNIPAQUE IOHEXOL 350 MG/ML SOLN COMPARISON:  08/14/2017 FINDINGS: Lower chest: There are new patchy infiltrates in the both lower lung fields. There is small left pleural effusion. There is minimal right pleural effusion. Coronary artery calcifications are seen. Hepatobiliary: No focal abnormality is seen in the liver. Gallbladder is unremarkable. Pancreas: No focal abnormality is seen Spleen: Unremarkable. Adrenals/Urinary Tract: Adrenals are not enlarged. There is no hydronephrosis.  There is 8 mm calculus in the upper pole of left kidney. There is 2 mm calculus in the lower pole of left kidney. There are few low-density lesions in the renal cortex largest measuring 2.6 cm in the anterior midportion of left kidney. Ureters are unremarkable. Urinary bladder is unremarkable. Stomach/Bowel: There is significant interval increase in size of hiatal hernia.  Possible organo axial volvulus of the stomach in the hiatal hernia. Small bowel loops are not dilated. Appendix is not distinctly seen. Cecum appears more medial than usual in position. There is no significant wall thickening in colon. There is no pericolic stranding. Vascular/Lymphatic: There are scattered arterial calcifications. Reproductive: Prostate is smaller than usual with central low attenuation suggesting possible transurethral resection. Other: There is no ascites or pneumoperitoneum. Small bilateral inguinal hernias containing fat are noted. Musculoskeletal: Degenerative changes are noted in the lumbar spine, more severe at L4-L5 level. IMPRESSION: There is significant interval increase in size of hiatal hernia. Possible organo axial volvulus of stomach within the hiatal hernia. There is no evidence of intestinal obstruction or pneumoperitoneum. There is no hydronephrosis. There are new patchy infiltrates in both lower lung fields suggesting atelectasis/pneumonia. Small left pleural effusion. Minimal right pleural effusion. Coronary artery calcifications are seen. There are 2 nonobstructing left renal stones. There are multiple renal cysts. Other findings as described in the body of the report. Electronically Signed   By: Elmer Picker M.D.   On: 03/26/2021 14:57   DG Fluoro Rm 1-60 Min  Result Date: 03/26/2021 CLINICAL DATA:  Feeding difficulties, weak swallow, concern for aspiration, NPO status EXAM: DG C-ARM 1-60 MIN CONTRAST:  10cc water soluble contrast FLUOROSCOPY TIME:  Fluoroscopy Time:  1 minute and 48 seconds  Radiation Exposure Index (if provided by the fluoroscopic device): 9.6 mGy Number of Acquired Spot Images: 0 COMPARISON:  Attempt from 03/24/21, plain films abd. FINDINGS: The left naris was prepped with viscous lidocaine jelly and a 10 [place] Dobbhoff style catheter was inserted. Using fluoroscopic guidance, the tube was advanced into the intrathoracic stomach. Despite multiple attempts with guidewire, contrast medium, and repositioning of table and patient, access to the intra-abdominal portion of the stomach was not achieved. Attempt was aborted, tube removed, and examination ended. IMPRESSION: Unsuccessful second attempt of fluoroscopic guided feeding tube placement. Consider endoscopic placement of short-term NG/ND tube or surgical placement of G-tube/J-tube. Anatomy not amenable to percutaneous G-tube placement. Findings communicated to ordering provider Risa Grill, PA-C, at time of exam. Procedure performed by Pasty Spillers PA, supervised by Abigail Miyamoto, M.D. Electronically Signed   By: Abigail Miyamoto M.D.   On: 03/26/2021 08:24   DG Fluoro Rm 1-60 Min  Result Date: 03/24/2021 CLINICAL DATA:  Feeding tube adjustment EXAM: DG C-ARM 1-60 MIN CONTRAST:  25 mL Gastrografin FLUOROSCOPY TIME:  Fluoroscopy Time:  4 minutes Radiation Exposure Index (if provided by the fluoroscopic device): 35.2 mGy COMPARISON:  None. FINDINGS: Adjustment of the feeding tube was attempted using fluoroscopic guidance. Tube and wire consistently directed superiorly into the hiatal hernia rather than through the esophageal hiatus. Administration of small volume of contrast filled the hiatal hernia. IMPRESSION: Technically unsuccessful fluoroscopic guided feeding tube adjustment. Electronically Signed   By: Macy Mis M.D.   On: 03/24/2021 13:48   EP PPM/ICD IMPLANT  Result Date: 03/06/2021 SURGEON:  Allegra Lai, MD   PREPROCEDURE DIAGNOSIS:  Cryptogenic Stroke   POSTPROCEDURE DIAGNOSIS:  Cryptogenic Stroke    PROCEDURES:   1. Implantable loop recorder implantation   INTRODUCTION:  GRANVEL PROUDFOOT is a 81 y.o. male with a history of unexplained stroke who presents today for implantable loop implantation.  The patient has had a cryptogenic stroke.  Despite an extensive workup by neurology, no reversible causes have been identified.  he has worn telemetry during which he did not have arrhythmias.  There is significant concern for possible atrial fibrillation  as the cause for the patients stroke.  The patient therefore presents today for implantable loop implantation.   DESCRIPTION OF PROCEDURE:  Informed written consent was obtained, and the patient was brought to the electrophysiology lab in a fasting state.  The patient required no sedation for the procedure today.  Mapping over the patient's chest was performed by the EP lab staff to identify the area where electrograms were most prominent for ILR recording.  This area was found to be the left parasternal region over the 3rd-4th intercostal space. The patients left chest was therefore prepped and draped in the usual sterile fashion by the EP lab staff. The skin overlying the left parasternal region was infiltrated with lidocaine for local analgesia.  A 0.5-cm incision was made over the left parasternal region over the 3rd intercostal space.  A subcutaneous ILR pocket was fashioned using a combination of sharp and blunt dissection.  A Medtronic Reveal Linq model [place] [id number] implantable loop recorder was then placed into the pocket  R waves were very prominent and measured 0.81m. EBL<1 ml.  Steri- Strips and a sterile dressing were then applied.  There were no early apparent complications.   CONCLUSIONS:  1. Successful implantation of a Medtronic Reveal LINQ implantable loop recorder for cryptogenic stroke  2. No early apparent complications.   IR INTRAVSC STENT CERV CAROTID W/EMB-PROT MOD SED  Result Date: 03/08/2021 INDICATION: 81year old male with past medical history  significant for coronary artery have anemia, hypertension, remote history of prostate cancer and renal oncocytoma. He presented to emergency department with left-sided weakness. NIHSS at presentation 8; baseline modified Rankin scale 1. His last known well was unclear, probably 10 a.m. on March 05, 2021. Head CT showed large acute territory infarct or hemorrhage. CT angiogram of the head and neck showed critical stenosis of the cervical right ICA with suspected intraluminal thrombus and no intracranial large vessel occlusion. Given acute neurological symptoms with possible acutely thrombosed right carotid plaque, decision was made to proceed with an emergency right carotid stent. EXAM: ULTRASOUND-GUIDED VASCULAR ACCESS DIAGNOSTIC CEREBRAL ANGIOGRAM RIGHT CAROTID STENTING WITH CEREBRAL PROTECTION DEVICE COMPARISON:  CT/CT angiogram of the head and neck March 05, 2021. MEDICATIONS: Intravenous Cangrelor bolus and drip. ANESTHESIA/SEDATION: Procedure was performed under monitored anesthesia care (MAC). CONTRAST:  100 mL of Omnipaque 300 mg per FLUOROSCOPY TIME:  Fluoroscopy Time: 10 minutes 48 seconds (430 mGy). COMPLICATIONS: None immediate. TECHNIQUE: Informed written consent was obtained from the patient after a thorough discussion of the procedural risks, benefits and alternatives. All questions were addressed. Maximal Sterile Barrier Technique was utilized including caps, mask, sterile gowns, sterile gloves, sterile drape, hand hygiene and skin antiseptic. A timeout was performed prior to the initiation of the procedure. The right groin was prepped and draped in the usual sterile fashion. The right groin area was infiltrated with lidocaine 1% at the level of the right common femoral artery, under ultrasound guidance. Using a micropuncture kit and the modified Seldinger technique, access was gained to the right common femoral artery and an 8 French sheath was placed. Real-time ultrasound guidance was utilized for  vascular access including the acquisition of a permanent ultrasound image documenting patency of the accessed vessel. Under fluoroscopy, an 8 [place] balloon guide catheter was navigated over a 6 [place] 2 catheter and a 0.035" Terumo Glidewire into the aortic arch. The catheter was placed into the right common carotid artery. The diagnostic catheter was removed. Right common carotid artery angiogram with right anterior oblique  view of the neck was obtained. Then, angiograms with frontal and lateral views of the were obtained. FINDINGS: 1. The right common femoral artery is patent, with adequate caliber for vascular access. 2. Prominent atherosclerotic changes at the right carotid bifurcation with ulcerated plaque as well as filling defect projecting in the vein, suspicious for acute thrombus. Findings result in approximately 85% stenosis of the right ICA. 3. Brisk contrast opacification of the right MCA and bilateral ACA vascular tree without evidence of intracranial arterial occlusion. PROCEDURE: A right common carotid artery angiogram was obtained with right anterior oblique view in utilized as roadmap. Then, a 2.5-4.8 mm Emboshield nav 6 cerebral protection device was navigated and deployed into the distal cervical segment of the right ICA. Then, a 6-8 x 40 mm XACT carotid stent was navigated into the right carotid bifurcation. The guiding catheter balloon was inflated and the catheter was connected to an aspiration pump. The stent was then deployed spanning the distal right common carotid artery and proximal cervical right internal carotid artery under constant aspiration. The guiding catheter balloon was deflated and aspiration was stopped. Clear blood return was noted from the guide catheter. The cerebral protection device was subsequently recaptured. Right common carotid artery angiograms with right anterior oblique view of the neck showed adequate stent position with resolution of stenosis.  Angiograms with frontal, lateral and right anterior oblique views of the head showed no evidence of thromboembolic complication. Delayed angiograms with right anterior oblique view of the neck showed patent stent with no evidence of in stent left formation. The catheter was subsequently withdrawn. A right common femoral artery angiogram was obtained in right anterior oblique view of via sheath side port contrast injection. The puncture is at the level of the mid right common femoral artery which has normal caliber with no evidence of significant atherosclerotic disease. The right common femoral artery sheath was exchanged over the wire for a Perclose pro style which was utilized for access closure. Immediate hemostasis was achieved. IMPRESSION: 1. Successful cervical right carotid stenting with cerebral protection device for treatment of acutely symptomatic right carotid severe stenosis. 2. No evidence of thromboembolic complication. PLAN: 1. Dual anti-platelet with aspirin and Brilinta. 2. Follow-up carotid duplex in 3 months. Electronically Signed   By: [name] M.D.   On: 03/08/2021 10:19   IR US Guide Vasc Access Right  Result Date: 03/08/2021 INDICATION: 81 year old male with past medical history significant for coronary artery have anemia, hypertension, remote history of prostate cancer and renal oncocytoma. He presented to emergency department with left-sided weakness. NIHSS at presentation 8; baseline modified Rankin scale 1. His last known well was unclear, probably 10 a.m. on March 05, 2021. Head CT showed large acute territory infarct or hemorrhage. CT angiogram of the head and neck showed critical stenosis of the cervical right ICA with suspected intraluminal thrombus and no intracranial large vessel occlusion. Given acute neurological symptoms with possible acutely thrombosed right carotid plaque, decision was made to proceed with an emergency right carotid stent. EXAM:  ULTRASOUND-GUIDED VASCULAR ACCESS DIAGNOSTIC CEREBRAL ANGIOGRAM RIGHT CAROTID STENTING WITH CEREBRAL PROTECTION DEVICE COMPARISON:  CT/CT angiogram of the head and neck March 05, 2021. MEDICATIONS: Intravenous Cangrelor bolus and drip. ANESTHESIA/SEDATION: Procedure was performed under monitored anesthesia care (MAC). CONTRAST:  100 mL of Omnipaque 300 mg per FLUOROSCOPY TIME:  Fluoroscopy Time: 10 minutes 48 seconds (430 mGy). COMPLICATIONS: None immediate. TECHNIQUE: Informed written consent was obtained from the patient after a thorough discussion of the procedural  risks, benefits and alternatives. All questions were addressed. Maximal Sterile Barrier Technique was utilized including caps, mask, sterile gowns, sterile gloves, sterile drape, hand hygiene and skin antiseptic. A timeout was performed prior to the initiation of the procedure. The right groin was prepped and draped in the usual sterile fashion. The right groin area was infiltrated with lidocaine 1% at the level of the right common femoral artery, under ultrasound guidance. Using a micropuncture kit and the modified Seldinger technique, access was gained to the right common femoral artery and an 8 French sheath was placed. Real-time ultrasound guidance was utilized for vascular access including the acquisition of a permanent ultrasound image documenting patency of the accessed vessel. Under fluoroscopy, an 8 [place] Walrus balloon guide catheter was navigated over a 6 [place] Berenstein 2 catheter and a 0.035" Terumo Glidewire into the aortic arch. The catheter was placed into the right common carotid artery. The diagnostic catheter was removed. Right common carotid artery angiogram with right anterior oblique view of the neck was obtained. Then, angiograms with frontal and lateral views of the were obtained. FINDINGS: 1. The right common femoral artery is patent, with adequate caliber for vascular access. 2. Prominent atherosclerotic changes at the  right carotid bifurcation with ulcerated plaque as well as filling defect projecting in the vein, suspicious for acute thrombus. Findings result in approximately 85% stenosis of the right ICA. 3. Brisk contrast opacification of the right MCA and bilateral ACA vascular tree without evidence of intracranial arterial occlusion. PROCEDURE: A right common carotid artery angiogram was obtained with right anterior oblique view in utilized as roadmap. Then, a 2.5-4.8 mm Emboshield nav 6 cerebral protection device was navigated and deployed into the distal cervical segment of the right ICA. Then, a 6-8 x 40 mm XACT carotid stent was navigated into the right carotid bifurcation. The guiding catheter balloon was inflated and the catheter was connected to an aspiration pump. The stent was then deployed spanning the distal right common carotid artery and proximal cervical right internal carotid artery under constant aspiration. The guiding catheter balloon was deflated and aspiration was stopped. Clear blood return was noted from the guide catheter. The cerebral protection device was subsequently recaptured. Right common carotid artery angiograms with right anterior oblique view of the neck showed adequate stent position with resolution of stenosis. Angiograms with frontal, lateral and right anterior oblique views of the head showed no evidence of thromboembolic complication. Delayed angiograms with right anterior oblique view of the neck showed patent stent with no evidence of in stent left formation. The catheter was subsequently withdrawn. A right common femoral artery angiogram was obtained in right anterior oblique view of via sheath side port contrast injection. The puncture is at the level of the mid right common femoral artery which has normal caliber with no evidence of significant atherosclerotic disease. The right common femoral artery sheath was exchanged over the wire for a Perclose pro style which was utilized for  access closure. Immediate hemostasis was achieved. IMPRESSION: 1. Successful cervical right carotid stenting with cerebral protection device for treatment of acutely symptomatic right carotid severe stenosis. 2. No evidence of thromboembolic complication. PLAN: 1. Dual anti-platelet with aspirin and Brilinta. 2. Follow-up carotid duplex in 3 months. Electronically Signed   By: [name] M.D.   On: 03/08/2021 10:19   DG Chest Port 1 View  Result Date: 03/20/2021 CLINICAL DATA:  Cough EXAM: PORTABLE CHEST 1 VIEW COMPARISON:  03/16/2021 FINDINGS: Multifocal pulmonary infiltrates are again identified  and appear improved, particularly at the left lung base, likely infectious or inflammatory in the acute setting. No pneumothorax or pleural effusion. Moderate hiatal hernia again noted. Cardiac size is within normal limits. Implanted loop recorder again noted. Pulmonary vascularity is normal. IMPRESSION: Interval improvement in multifocal pulmonary infiltrates, likely infectious or inflammatory. Moderate hiatal hernia. Electronically Signed   By: Fidela Salisbury M.D.   On: 03/20/2021 20:51   DG CHEST PORT 1 VIEW  Result Date: 03/16/2021 CLINICAL DATA:  Cough EXAM: PORTABLE CHEST 1 VIEW COMPARISON:  03/07/2021 FINDINGS: Persistent opacities at the lung bases. Additional patchy density elsewhere. No significant pleural effusion. No pneumothorax. Similar cardiomediastinal contours. Hiatal hernia. IMPRESSION: Bilateral opacities primarily at the lung bases remain present suspicious for pneumonia. Electronically Signed   By: Macy Mis M.D.   On: 03/16/2021 09:12   DG CHEST PORT 1 VIEW  Result Date: 03/07/2021 CLINICAL DATA:  Respiratory abnormalities. EXAM: PORTABLE CHEST 1 VIEW COMPARISON:  Radiograph 02/26/2021 FINDINGS: Lung volumes are low. Stable heart size and mediastinal contours. Patchy opacity in both lower lung zones. Mild additional patchy opacity in the right suprahilar lung.  Vascular congestion. No pleural effusion. Anti lordotic positioning. IMPRESSION: 1. Low lung volumes with patchy bibasilar and right suprahilar opacities, may represent multifocal pneumonia, particularly at the right lung base. 2. Vascular congestion. Electronically Signed   By: Keith Rake M.D.   On: 03/07/2021 17:35   DG Abd Portable 1V  Result Date: 03/24/2021 CLINICAL DATA:  NG tube placement. EXAM: PORTABLE ABDOMEN - 1 VIEW COMPARISON:  January 24th of 2023. FINDINGS: Patchy bilateral airspace disease is incidentally noted. A feeding tube has been placed, gastric tube has been removed. Feeding tube tip in the area of a hiatal hernia projecting in the midline just above the expected location of the esophageal hiatus. Cardiac loop recorder over the LEFT inferior chest as before. Distension of colonic loops in the abdomen with stool mixed with ingested contrast media in the colon. IMPRESSION: 1. Feeding tube terminating in the area of hiatal hernia. Tip just above expected location of the esophageal hiatus. 2. Distension of the colon with stool mixed with ingested contrast media in the colon. Findings are not changed compared to previous imaging. 3. Persistent patchy bilateral airspace disease. Electronically Signed   By: Zetta Bills M.D.   On: 03/24/2021 11:13   DG Swallowing Func-Speech Pathology  Result Date: 03/27/2021 Table formatting from the original result was not included. Objective Swallowing Evaluation: Type of Study: MBS-Modified Barium Swallow Study  Patient Details Name: SIMRAN MANNIS MRN: [id number] Date of Birth: June 26, 1940 Today's Date: 03/26/2021 Time: SLP Start Time (ACUTE ONLY): 0900 -SLP Stop Time (ACUTE ONLY): 0930 SLP Time Calculation (min) (ACUTE ONLY): 30 min Past Medical History: Past Medical History: Diagnosis Date  Allergy   Arthritis   gen.  and left hip  BPH (benign prostatic hypertrophy)   Coronary artery disease   GERD (gastroesophageal reflux disease)   occasional   History of kidney stones   2007  Hyperlipidemia   Hypertension   Kidney tumor   LBP (low back pain) 06/2005  L5 radicular symptoms; MRI of LS spine severe spondylosis at L4-L5 with central cancal stenosis   Nocturia   Organic impotence   Prostate cancer ([place]) 10/2011  (Low grade) [facility] - Dr. Junious Silk  Pyelonephritis 03/05/2013  Renal oncocytoma of right kidney 01/23/2013  As 3.2 x 3.5 cm enhancing exophytic mass projecting off the lower pole. This is consistent with a renal cell  carcinoma. No retroperitoneal lymphadenopathy.   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  Wears glasses  Past Surgical History: Past Surgical History: Procedure Laterality Date  COLONOSCOPY  02/15/2006, 2014  Internal hemorrhoids (Dr [name]), last in 2014  [place] Right 1980  IR ANGIO INTRA EXTRACRAN SEL INTERNAL CAROTID UNI R MOD SED  03/05/2021  IR INTRAVSC STENT CERV CAROTID W/EMB-PROT MOD SED INCL ANGIO  03/05/2021  IR US GUIDE VASC ACCESS RIGHT  03/05/2021  LYMPHADENECTOMY Bilateral 02/15/2013  Procedure: LYMPHADENECTOMY WITH INDOCYANINE GREEN DYE INJECTION;  Surgeon: [name], MD;  Location: [facility];  Service: Urology;  Laterality: Bilateral;  PROSTATE BIOPSY N/A 12/18/2012  Procedure: BIOPSY TRANSRECTAL ULTRASONIC PROSTATE (TUBP);  Surgeon: [name], MD;  Location: [facility];  Service: Urology;  Laterality: N/A;  PROSTATE BIOPSY  12/05/11  gleason 6, 3/12 cores  RADIOLOGY WITH ANESTHESIA N/A 03/05/2021  Procedure: IR WITH ANESTHESIA;  Surgeon: Radiologist, Medication, MD;  Location: [place];  Service: Radiology;  Laterality: N/A;  REPAIR UMBILICAL AND VENTRAL HERNIA'S W/ MESH  08-21-2009  RIGHT/LEFT HEART CATH AND CORONARY ANGIOGRAPHY N/A 08/06/2019  Procedure: RIGHT/LEFT HEART CATH AND CORONARY ANGIOGRAPHY;  Surgeon: [name], Peter M, MD;  Location: [facility] [facility];  Service: Cardiovascular;  Laterality: N/A;  ROBOT ASSISTED LAPAROSCOPIC RADICAL PROSTATECTOMY N/A 02/15/2013  Procedure:  ROBOTIC ASSISTED LAPAROSCOPIC RADICAL PROSTATECTOMY;  Surgeon: [name], MD;  Location: [facility];  Service: Urology;  Laterality: N/A;  ROBOTIC ASSITED PARTIAL NEPHRECTOMY Right 02/15/2013  Procedure: ROBOTIC ASSITED PARTIAL NEPHRECTOMY;  Surgeon: [name], MD;  Location: [facility];  Service: Urology;  Laterality: Right;  SPERMATOCELECTOMY Left 12/18/2012  Procedure: LEFT SPERMATOCELECTOMY;  Surgeon: [name], MD;  Location[facility];  Service: Urology;  Laterality: Left;  TONSILLECTOMY AND ADENOIDECTOMY  as child  TOTAL KNEE ARTHROPLASTY Left 05/26/2020  Procedure: TOTAL KNEE ARTHROPLASTY;  Surgeon: Paralee Cancel, MD;  Location: [facility];  Service: Orthopedics;  Laterality: Left;  70 mins  TRANSURETHRAL RESECTION OF PROSTATE  AGE 14 HPI: Pt is a 81 y/o male presented to ED on 03/05/21 for L sided weakness. Recent flue dx. CTA showed R ICA occlusion. S/p cerebral angiogram and R carotid stenting on 1/6. MRI showed multifocal acute/early subacute cortical ischemia in anterior R MCA and small focus of acute/early subacute ischemia at L caudate head. Pt evaluated by SLP for dysarthria, MD then ordered swallow eval due to persistent pna and complaint of coughing while eating. PMH: HTN, CAD, prostate cancer  No data recorded  Recommendations for follow up therapy are one component of a multi-disciplinary discharge planning process, led by the attending physician.  Recommendations may be updated based on patient status, additional functional criteria and insurance authorization. Assessment / Plan / Recommendation Clinical Impressions 03/04/2021 Clinical Impression  Pt demonstrates a moderate to severe pharyngeal dysphagia, likely acute on chronic in nature. Pt demonstrates structural changes in pharynx impacting function that are likely related to curvature of spinal column. Pt has initiation of swallow at pyriform sinuses, leading to instances of sensed aspiration of thin liquids before  the swallow without expectoration (PAS 7). Pt additionally has moderate residue with liquid and solid boluses due to restricted bolus flow (decreased epiglottic deflection and UES opening) appearing secondary to curvature of cervical spine. SLP able to cue pt to complete a chin tuck which reduced pyriform residue and also complete preventative throat clears and second swallow to clear nectar thick penetrate. Pt fatigued significantly during exam and generalized weakness is increasing pts  risk at this time. Question if a dys 3 (mech soft diet) and nectar thick liqudis will be tolerated despite efforts. Will attempt modified diet and strengthing interventions. Pt recommended to f/u with [facility]. SLP Visit Diagnosis Dysphagia, pharyngeal phase (R13.13) Attention and concentration deficit following -- Frontal lobe and executive function deficit following -- Impact on safety and function Moderate aspiration risk;Risk for inadequate nutrition/hydration   Treatment Recommendations 03/08/2021 Treatment Recommendations Therapy as outlined in treatment plan below   Prognosis 03/07/2021 Prognosis for Safe Diet Advancement Fair Barriers to Reach Goals -- Barriers/Prognosis Comment -- Diet Recommendations 03/14/2021 SLP Diet Recommendations Dysphagia 3 (Mech soft) solids;Nectar thick liquid Liquid Administration via Cup;Straw Medication Administration Crushed with puree Compensations Slow rate;Small sips/bites;Follow solids with liquid;Clear throat intermittently;Effortful swallow;Chin tuck;Multiple dry swallows after each bite/sip Postural Changes Remain semi-upright after after feeds/meals (Comment);Seated upright at 90 degrees   Other Recommendations 03/03/2021 Recommended Consults -- Oral Care Recommendations Oral care BID Other Recommendations Have oral suction available Follow Up Recommendations Acute inpatient rehab (3hours/day) Assistance recommended at discharge -- Functional Status Assessment Patient has had a recent decline  in their functional status and demonstrates the ability to make significant improvements in function in a reasonable and predictable amount of time. Frequency and Duration  03/13/2021 Speech Therapy Frequency (ACUTE ONLY) min 2x/week Treatment Duration 2 weeks   Oral Phase 03/14/2021 Oral Phase Impaired Oral - Pudding Teaspoon -- Oral - Pudding Cup -- Oral - Honey Teaspoon -- Oral - Honey Cup -- Oral - Nectar Teaspoon -- Oral - Nectar Cup Lingual/palatal residue Oral - Nectar Straw Lingual/palatal residue Oral - Thin Teaspoon Lingual/palatal residue Oral - Thin Cup Lingual/palatal residue Oral - Thin Straw Lingual/palatal residue Oral - Puree Lingual/palatal residue Oral - Mech Soft Lingual/palatal residue Oral - Regular -- Oral - Multi-Consistency -- Oral - Pill -- Oral Phase - Comment --  Pharyngeal Phase 02/28/2021 Pharyngeal Phase Impaired Pharyngeal- Pudding Teaspoon -- Pharyngeal -- Pharyngeal- Pudding Cup -- Pharyngeal -- Pharyngeal- Honey Teaspoon -- Pharyngeal -- Pharyngeal- Honey Cup -- Pharyngeal -- Pharyngeal- Nectar Teaspoon -- Pharyngeal -- Pharyngeal- Nectar Cup Delayed swallow initiation-pyriform sinuses;Reduced epiglottic inversion;Penetration/Apiration after swallow;Pharyngeal residue - valleculae;Pharyngeal residue - pyriform;Compensatory strategies attempted (with notebox) Pharyngeal Material enters airway, CONTACTS cords and not ejected out;Material enters airway, remains ABOVE vocal cords and not ejected out;Material does not enter airway Pharyngeal- Nectar Straw Delayed swallow initiation-pyriform sinuses;Reduced epiglottic inversion;Penetration/Apiration after swallow;Pharyngeal residue - valleculae;Pharyngeal residue - pyriform;Compensatory strategies attempted (with notebox) Pharyngeal Material enters airway, CONTACTS cords and not ejected out;Material enters airway, remains ABOVE vocal cords and not ejected out;Material does not enter airway Pharyngeal- Thin Teaspoon Delayed swallow  initiation-pyriform sinuses;Reduced epiglottic inversion;Penetration/Aspiration before swallow;Trace aspiration Pharyngeal Material enters airway, passes BELOW cords and not ejected out despite cough attempt by patient Pharyngeal- Thin Cup Delayed swallow initiation-pyriform sinuses;Reduced epiglottic inversion;Penetration/Apiration after swallow;Pharyngeal residue - valleculae;Pharyngeal residue - pyriform;Compensatory strategies attempted (with notebox) Pharyngeal Material enters airway, CONTACTS cords and not ejected out;Material enters airway, remains ABOVE vocal cords and not ejected out;Material does not enter airway Pharyngeal- Thin Straw Delayed swallow initiation-pyriform sinuses;Reduced epiglottic inversion;Penetration/Apiration after swallow;Pharyngeal residue - valleculae;Pharyngeal residue - pyriform;Compensatory strategies attempted (with notebox) Pharyngeal Material enters airway, CONTACTS cords and not ejected out;Material enters airway, remains ABOVE vocal cords and not ejected out;Material does not enter airway Pharyngeal- Puree Delayed swallow initiation-pyriform sinuses;Reduced epiglottic inversion;Pharyngeal residue - valleculae;Pharyngeal residue - pyriform;Compensatory strategies attempted (with notebox) Pharyngeal -- Pharyngeal- Mechanical Soft Delayed swallow initiation-pyriform sinuses;Reduced epiglottic inversion;Pharyngeal residue - valleculae;Pharyngeal residue - pyriform;Compensatory  strategies attempted (with notebox) Pharyngeal -- Pharyngeal- Regular -- Pharyngeal -- Pharyngeal- Multi-consistency -- Pharyngeal -- Pharyngeal- Pill -- Pharyngeal -- Pharyngeal Comment --  Cervical Esophageal Phase  03/19/2021 Cervical Esophageal Phase Impaired Pudding Teaspoon -- Pudding Cup -- Honey Teaspoon -- Honey Cup -- Nectar Teaspoon -- Nectar Cup Reduced cricopharyngeal relaxation;Esophageal backflow into cervical esophagus;Esophageal backflow into the pharynx Nectar Straw Reduced cricopharyngeal  relaxation;Esophageal backflow into cervical esophagus;Esophageal backflow into the pharynx Thin Teaspoon -- Thin Cup -- Thin Straw -- Puree -- Mechanical Soft -- Regular -- Multi-consistency -- Pill -- Cervical Esophageal Comment see impression DeBlois, Katherene Ponto 03/20/2021, 10:07 AM                     ECHOCARDIOGRAM COMPLETE  Result Date: 03/06/2021    ECHOCARDIOGRAM REPORT   Patient Name:   RANDIE TALLARICO Date of Exam: 03/06/2021 Medical Rec #:  [id number]         Height:       72.0 in Accession #:    [id number]        Weight:       210.1 lb Date of Birth:  09-08-1940          BSA:          2.176 m Patient Age:    31 years          BP:           143/66 mmHg Patient Gender: M                 HR:           93 bpm. Exam Location:  Inpatient Procedure: 2D Echo, Cardiac Doppler, Color Doppler and Intracardiac            Opacification Agent Indications:    Stroke  History:        Patient has prior history of Echocardiogram examinations, most                 recent 06/13/2019.  Sonographer:    Clayton Lefort RDCS (AE) Referring Phys: [id number] Derek Jack  Sonographer Comments: Technically difficult study due to poor echo windows and no subcostal window. Image acquisition challenging due to respiratory motion. Patient coughing throughout test. IMPRESSIONS  1. Left ventricular ejection fraction, by estimation, is 65 to 70%. The left ventricle has normal function. The left ventricle has no regional wall motion abnormalities. There is moderate left ventricular hypertrophy. Left ventricular diastolic parameters are indeterminate.  2. Right ventricular systolic function is normal. The right ventricular size is normal.  3. The mitral valve is normal in structure. No evidence of mitral valve regurgitation. No evidence of mitral stenosis.  4. The aortic valve is normal in structure. Aortic valve regurgitation is not visualized. No aortic stenosis is present.  5. The inferior vena cava is normal in size with greater than 50%  respiratory variability, suggesting right atrial pressure of 3 mmHg. Comparison(s): Prior ascending aorta 42-43 mm. Unable to visual in current study. Conclusion(s)/Recommendation(s): No intracardiac source of embolism detected on this transthoracic study. Consider a transesophageal echocardiogram to exclude cardiac source of embolism if clinically indicated. FINDINGS  Left Ventricle: Left ventricular ejection fraction, by estimation, is 65 to 70%. The left ventricle has normal function. The left ventricle has no regional wall motion abnormalities. The left ventricular internal cavity size was normal in size. There is  moderate left ventricular hypertrophy. Left ventricular diastolic parameters are indeterminate. Right Ventricle: The right ventricular  size is normal. No increase in right ventricular wall thickness. Right ventricular systolic function is normal. Left Atrium: Left atrial size was normal in size. Right Atrium: Right atrial size was normal in size. Pericardium: There is no evidence of pericardial effusion. Mitral Valve: The mitral valve is normal in structure. No evidence of mitral valve regurgitation. No evidence of mitral valve stenosis. Tricuspid Valve: The tricuspid valve is normal in structure. Tricuspid valve regurgitation is not demonstrated. No evidence of tricuspid stenosis. Aortic Valve: The aortic valve is normal in structure. Aortic valve regurgitation is not visualized. No aortic stenosis is present. Aortic valve mean gradient measures 7.0 mmHg. Aortic valve peak gradient measures 14.1 mmHg. Aortic valve area, by VTI measures 3.49 cm. Pulmonic Valve: The pulmonic valve was normal in structure. Pulmonic valve regurgitation is not visualized. No evidence of pulmonic stenosis. Aorta: The aortic root is normal in size and structure. Venous: The inferior vena cava is normal in size with greater than 50% respiratory variability, suggesting right atrial pressure of 3 mmHg. IAS/Shunts: No atrial  level shunt detected by color flow Doppler.  LEFT VENTRICLE PLAX 2D LVIDd:         4.70 cm LVIDs:         2.90 cm LV PW:         1.60 cm LV IVS:        1.80 cm LVOT diam:     2.30 cm LV SV:         108 LV SV Index:   49 LVOT Area:     4.15 cm  RIGHT VENTRICLE RV S prime:     17.40 cm/s TAPSE (M-mode): 2.4 cm LEFT ATRIUM           Index LA diam:      1.50 cm 0.69 cm/m LA Vol (A2C): 36.2 ml 16.64 ml/m LA Vol (A4C): 35.1 ml 16.13 ml/m  AORTIC VALVE AV Area (Vmax):    3.14 cm AV Area (Vmean):   3.59 cm AV Area (VTI):     3.49 cm AV Vmax:           188.00 cm/s AV Vmean:          126.000 cm/s AV VTI:            0.308 m AV Peak Grad:      14.1 mmHg AV Mean Grad:      7.0 mmHg LVOT Vmax:         142.00 cm/s LVOT Vmean:        109.000 cm/s LVOT VTI:          0.259 m LVOT/AV VTI ratio: 0.84  AORTA Ao Asc diam: 3.70 cm MITRAL VALVE MV Area (PHT): 2.87 cm     SHUNTS MV Decel Time: 264 msec     Systemic VTI:  0.26 m MV E velocity: 92.40 cm/s   Systemic Diam: 2.30 cm MV A velocity: 111.00 cm/s MV E/A ratio:  0.83 Candee Furbish MD Electronically signed by Candee Furbish MD Signature Date/Time: 03/06/2021/2:18:39 PM    Final    US THYROID  Result Date: 03/08/2021 CLINICAL DATA:  Incidental on CT. EXAM: THYROID ULTRASOUND TECHNIQUE: Ultrasound examination of the thyroid gland and adjacent soft tissues was performed. COMPARISON:  03/05/2021, 02/27/2020 FINDINGS: Parenchymal Echotexture: Mildly heterogenous Isthmus: 0.6 cm, previously 1.3 cm Right lobe: 4.2 x 1.4 x 1.7 cm, previously 4.5 x 2.3 x 1.8 cm Left lobe: 3.9 x 1.8 x 1.6 cm, previously 4.8 x 1.8 x 2.1  cm _________________________________________________________ Estimated total number of nodules >/= 1 cm: 1 Number of spongiform nodules >/=  2 cm not described below (TR1): 0 Number of mixed cystic and solid nodules >/= 1.5 cm not described below ([place]): 0 _________________________________________________________ Nodule # 1: Prior biopsy: No Location: Right; Superior Maximum  size: 0.7 cm; Other 2 dimensions: 0.6 x 0.5 cm, previously, 0.6 x 0.6 x 0.5 cm Composition: cannot determine (2) Echogenicity: hypoechoic (2) Shape: not taller-than-wide (0) Margins: ill-defined (0) Echogenic foci: peripheral calcifications (2) ACR TI-RADS total points: 6. ACR TI-RADS risk category:  TR4 (4-6 points). Significant change in size (>/= 20% in two dimensions and minimal increase of 2 mm): No Change in features: No Change in ACR TI-RADS risk category: No ACR TI-RADS recommendations: Given size (<0.9 cm) and appearance, this nodule does NOT meet TI-RADS criteria for biopsy or dedicated follow-up. _________________________________________________________ Nodule # 2: Prior biopsy: No Location: Right; Inferior Maximum size: 1.1 cm; Other 2 dimensions: 1.0 x 0.9 cm, previously, 1.1 x 1.0 x 0.9 cm Composition: solid/almost completely solid (2) Echogenicity: hypoechoic (2) Shape: not taller-than-wide (0) Margins: smooth (0) Echogenic foci: none (0) ACR TI-RADS total points: 4. ACR TI-RADS risk category:  TR4 (4-6 points). Significant change in size (>/= 20% in two dimensions and minimal increase of 2 mm): No Change in features: No Change in ACR TI-RADS risk category: No ACR TI-RADS recommendations: *Given size (>/= 1 - 1.4 cm) and appearance, a follow-up ultrasound in 1 year should be considered based on TI-RADS criteria. _________________________________________________________ No cervical lymphadenopathy identified. IMPRESSION: 1. The right paratracheal nodule described on recent CT head neck is not visualized sonographically. In retrospect, this CT finding is similar to 06/13/2013 comparison chest CT and appears extrathyroidal. 2. Unchanged appearance of right inferior solid thyroid nodule (labeled 2, 1.1 cm) which again meets criteria (TI-RADS category 4) for 1 year ultrasound surveillance. The above is in keeping with the ACR TI-RADS recommendations - J Am Coll Radiol 2017;14:587-595. Ruthann Cancer, MD  [facility] Radiology Electronically Signed   By: Ruthann Cancer M.D.   On: 03/08/2021 08:19   CT HEAD CODE STROKE WO CONTRAST  Result Date: 03/05/2021 CLINICAL DATA:  Code stroke.  Left-sided weakness EXAM: CT HEAD WITHOUT CONTRAST TECHNIQUE: Contiguous axial images were obtained from the base of the skull through the vertex without intravenous contrast. COMPARISON:  None. FINDINGS: Brain: There is no evidence of acute intracranial hemorrhage, extra-axial fluid collection, or acute infarct. Parenchymal volume is normal. The ventricles are normal in size. There is no mass lesion. There is no midline shift. Vascular: No dense vessel is seen. Skull: Normal. Negative for fracture or focal lesion. Sinuses/Orbits: There is layering fluid in the maxillary and sphenoid sinuses. Bilateral lens implants are in place. The globes and orbits are otherwise unremarkable. Other: None. ASPECTS [facility] Stroke Program Early CT Score) - Ganglionic level infarction (caudate, lentiform nuclei, internal capsule, insula, M1-M3 cortex): Seven - Supraganglionic infarction (M4-M6 cortex): 3 Total score (0-10 with 10 being normal): 10 IMPRESSION: 1. No acute intracranial pathology. 2. ASPECTS is 10 3. Layering fluid in the maxillary and sphenoid sinuses which can be seen with acute sinusitis in the correct clinical setting. These results were paged via AMION at the time of interpretation on 03/05/2021 at 2:29 pm to provider Dr Livia Snellen. Electronically Signed   By: Valetta Mole M.D.   On: 03/05/2021 14:32   [place] EKG SITE RITE  Result Date: 03/25/2021 If [facility] image not attached, placement  could not be confirmed due to current cardiac rhythm.  IR ANGIO INTRA EXTRACRAN SEL INTERNAL CAROTID UNI R MOD SED  Result Date: 03/08/2021 INDICATION: 81 year old male with past medical history significant for coronary artery have anemia, hypertension, remote history of prostate cancer and renal  oncocytoma. He presented to emergency department with left-sided weakness. NIHSS at presentation 8; baseline modified Rankin scale 1. His last known well was unclear, probably 10 a.m. on March 05, 2021. Head CT showed large acute territory infarct or hemorrhage. CT angiogram of the head and neck showed critical stenosis of the cervical right ICA with suspected intraluminal thrombus and no intracranial large vessel occlusion. Given acute neurological symptoms with possible acutely thrombosed right carotid plaque, decision was made to proceed with an emergency right carotid stent. EXAM: ULTRASOUND-GUIDED VASCULAR ACCESS DIAGNOSTIC CEREBRAL ANGIOGRAM RIGHT CAROTID STENTING WITH CEREBRAL PROTECTION DEVICE COMPARISON:  CT/CT angiogram of the head and neck March 05, 2021. MEDICATIONS: Intravenous Cangrelor bolus and drip. ANESTHESIA/SEDATION: Procedure was performed under monitored anesthesia care (MAC). CONTRAST:  100 mL of Omnipaque 300 mg per FLUOROSCOPY TIME:  Fluoroscopy Time: 10 minutes 48 seconds (430 mGy). COMPLICATIONS: None immediate. TECHNIQUE: Informed written consent was obtained from the patient after a thorough discussion of the procedural risks, benefits and alternatives. All questions were addressed. Maximal Sterile Barrier Technique was utilized including caps, mask, sterile gowns, sterile gloves, sterile drape, hand hygiene and skin antiseptic. A timeout was performed prior to the initiation of the procedure. The right groin was prepped and draped in the usual sterile fashion. The right groin area was infiltrated with lidocaine 1% at the level of the right common femoral artery, under ultrasound guidance. Using a micropuncture kit and the modified Seldinger technique, access was gained to the right common femoral artery and an 8 French sheath was placed. Real-time ultrasound guidance was utilized for vascular access including the acquisition of a permanent ultrasound image documenting patency of the  accessed vessel. Under fluoroscopy, an 8 [place] Walrus balloon guide catheter was navigated over a 6 [place] Berenstein 2 catheter and a 0.035" Terumo Glidewire into the aortic arch. The catheter was placed into the right common carotid artery. The diagnostic catheter was removed. Right common carotid artery angiogram with right anterior oblique view of the neck was obtained. Then, angiograms with frontal and lateral views of the were obtained. FINDINGS: 1. The right common femoral artery is patent, with adequate caliber for vascular access. 2. Prominent atherosclerotic changes at the right carotid bifurcation with ulcerated plaque as well as filling defect projecting in the vein, suspicious for acute thrombus. Findings result in approximately 85% stenosis of the right ICA. 3. Brisk contrast opacification of the right MCA and bilateral ACA vascular tree without evidence of intracranial arterial occlusion. PROCEDURE: A right common carotid artery angiogram was obtained with right anterior oblique view in utilized as roadmap. Then, a 2.5-4.8 mm Emboshield nav 6 cerebral protection device was navigated and deployed into the distal cervical segment of the right ICA. Then, a 6-8 x 40 mm XACT carotid stent was navigated into the right carotid bifurcation. The guiding catheter balloon was inflated and the catheter was connected to an aspiration pump. The stent was then deployed spanning the distal right common carotid artery and proximal cervical right internal carotid artery under constant aspiration. The guiding catheter balloon was deflated and aspiration was stopped. Clear blood return was noted from the guide catheter. The cerebral protection device was subsequently recaptured. Right common carotid artery angiograms with right anterior oblique  view of the neck showed adequate stent position with resolution of stenosis. Angiograms with frontal, lateral and right anterior oblique views of the head showed no evidence of  thromboembolic complication. Delayed angiograms with right anterior oblique view of the neck showed patent stent with no evidence of in stent left formation. The catheter was subsequently withdrawn. A right common femoral artery angiogram was obtained in right anterior oblique view of via sheath side port contrast injection. The puncture is at the level of the mid right common femoral artery which has normal caliber with no evidence of significant atherosclerotic disease. The right common femoral artery sheath was exchanged over the wire for a Perclose pro style which was utilized for access closure. Immediate hemostasis was achieved. IMPRESSION: 1. Successful cervical right carotid stenting with cerebral protection device for treatment of acutely symptomatic right carotid severe stenosis. 2. No evidence of thromboembolic complication. PLAN: 1. Dual anti-platelet with aspirin and Brilinta. 2. Follow-up carotid duplex in 3 months. Electronically Signed   By: [name] M.D.   On: 03/08/2021 10:19    Microbiology: Recent Results (from the past 240 hour(s))  Urine Culture     Status: Abnormal   Collection Time: 03/20/21  6:40 PM   Specimen: Urine, Clean Catch  Result Value Ref Range Status   Specimen Description URINE, CLEAN CATCH  Final   Special Requests NONE  Final   Culture (A)  Final    <10,000 COLONIES/mL INSIGNIFICANT GROWTH Performed at [facility], [address] [place], [place] [address]    Report Status 03/22/2021 FINAL  Final  Culture, blood (routine x 2)     Status: None   Collection Time: 03/20/21  7:14 PM   Specimen: BLOOD  Result Value Ref Range Status   Specimen Description BLOOD RIGHT ANTECUBITAL  Final   Special Requests   Final    BOTTLES DRAWN AEROBIC AND ANAEROBIC Blood Culture results may not be optimal due to an inadequate volume of blood received in culture bottles   Culture   Final    NO GROWTH 5 DAYS Performed at [facility],  [place] [address] [place], [place] [address]    Report Status 03/25/2021 FINAL  Final  Culture, blood (routine x 2)     Status: None   Collection Time: 03/20/21  7:15 PM   Specimen: BLOOD  Result Value Ref Range Status   Specimen Description BLOOD RIGHT ANTECUBITAL  Final   Special Requests   Final    BOTTLES DRAWN AEROBIC AND ANAEROBIC Blood Culture results may not be optimal due to an inadequate volume of blood received in culture bottles   Culture   Final    NO GROWTH 5 DAYS Performed at [facility], [place] [address], [place], [place] [address]    Report Status 03/25/2021 FINAL  Final    Time spent: 25 minutes  Signed: Barbie Banner 04/01/21

## 2021-03-31 NOTE — Progress Notes (Addendum)
Palliative Medicine Inpatient Follow Up Note  Consulting Provider: Izora Ribas, MD   Reason for consult:   [facility] [place] Palliative Medicine Consult  Reason for Consult? 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  He underwent diagnostic cerebral angiogram with right carotid stenting and was placed on Ticagrelor 90 mg twice daily and aspirin 81 mg.  He has since been in inpatient rehab where he has had failure to thrive and declined in terms of physical progress. Palliative care was asked to get involved given his ongoing aspiration risk to review with patients family the idea of a gastrostomy tube. This would be a complicated placement as patient has a large hiatal hernia.    Today's Discussion (2021/04/24):  *Please note that this is a verbal dictation therefore any spelling or grammatical errors are due to the "[place]" system interpretation.  Chart reviewed inclusive of vital signs, progress notes, laboratory results, and diagnostic images.   I met with Gwyndolyn Saxon at bedside this morning after primary team had rounded and sent a secure chat that he appears close to end of life.  Upon assessment, Raheel appears to have [place]ke respirations. He incrementally does moan, wince, and appear in general distress.  I spoke to patients daughter Manuela Schwartz and her spouse, Eduard Clos. We reviewed that he appears to have entered the stages of dying and I asked if we should continue with our aggressive efforts or change our focus to comfort. Manuela Schwartz was very clear that we should make him comfortable. We agreed to do what  we could for the time being while the rest of Williams family comes to the hospital and then after formal conversation and consent with Vickii Chafe we could pursue comfort focused care. ________________________________ Addendum:  I met with patients spouse, daughters, son(s) in law and granddaughter at bedside. Discussed the current situation in the setting of Williams shifted breathing pattern.  Reviewed the various avenues we could travel. Per patients spouse, Vickii Chafe she does not want him to suffer. Reviewed end of life care.  We talked about transition to comfort measures in house and what that would entail inclusive of medications to control pain, dyspnea, agitation, nausea, itching, and hiccups.    We discussed stopping all uneccessary measures such as cardiac monitoring, blood draws, needle sticks, and frequent vital signs.   I shared that given Bailey's current situation I believe a dilaudid gtt would be best t control his symptom burden which his family consented to.  Chaplain consulted.  Orders entered and nursing updated.  Palliative support provided _________________________________________________ Addendum #2:  I went to bedside to check patient symptoms this evening. He appears to be in some distress. I went ahead and primed a new IV line as the prior appeared to be running the dilaudid as a secondary - changed this to the primary with improvement in symptoms. Patients RN, Kennyth Lose provided ativan as well.  Patients extended family present at bedside where updates were provided.  Objective Assessment: Vital Signs Vitals:   03/27/21 2030 April 24, 2021 0442  BP: 140/76 136/81  Pulse:  65  Resp:  14  Temp:  98 F (36.7 C)  SpO2:  94%    Intake/Output Summary (Last 24 hours) at 04-09-2021 1249 Last data filed at 04-09-2021 0300 Gross per 24 hour  Intake 1307.43 ml  Output --  Net 1307.43 ml    Last Weight  Most recent update: 03/25/2021  5:15 PM    Weight  85.1 kg (187 lb  9.8 oz)            Gen:  Elderly Caucasian M in NAD HEENT: moist mucous membranes CV: Irregular rate and rhythm  PULM: On 1LPM [place], notable cough,  rhonchi ABD: soft/nontender  EXT: No edema  Neuro: Alert and oriented x1  SUMMARY OF RECOMMENDATIONS   DNAR/DNI  Gold DNR placed on Chart  Transition focus to comfort care  Comfort medication per [facility] - Dilaudid gtt to support symptom burden (ongoing aspiration)  Unrestricted visitation  Ongoing PMT involvement for complex symptom needs  Total Time: 90  MDM - High  Medical Decision Making:4 #/Complex Problems: 4                     Data Reviewed:    4             Management: 4 (1-Straightforward, 2-Low, 3-Moderate, 4-High) ______________________________________________________________________________________ [place] Palliative Medicine Team Team Cell Phone: [phone] Please utilize secure chat with additional questions, if there is no response within 30 minutes please call the above phone number  Palliative Medicine Team providers are available by phone from 7am to 7pm daily and can be reached through the team cell phone.  Should this patient require assistance outside of these hours, please call the patient's attending physician.

## 2021-03-31 NOTE — Progress Notes (Signed)
Patient resting in bed. Continues to have apneic episodes. PRN Morphine given as ordered.

## 2021-03-31 NOTE — Progress Notes (Incomplete)
Inpatient Rehabilitation Discharge Medication Review by a Pharmacist  A complete drug regimen review was completed for this patient to identify any potential clinically significant medication issues.  High Risk Drug Classes Is patient taking? Indication by Medication  Antipsychotic Yes Haldol tablet prn - EOL Haldol injection prn - EOL  Anticoagulant No   Antibiotic No   Opioid Yes Hydromorphone Continuous Infusion - EOL Morphine 5 mg prn - EOL  Antiplatelet No   Hypoglycemics/insulin No   Vasoactive Medication No   Chemotherapy No   Other Yes Lorazepam 1 mg prn - EOL     Type of Medication Issue Identified Description of Issue Recommendation(s)  Drug Interaction(s) (clinically significant)     Duplicate Therapy     Allergy     No Medication Administration End Date     Incorrect Dose     Additional Drug Therapy Needed     Significant med changes from prior encounter (inform family/care partners about these prior to discharge).    Other       Clinically significant medication issues were identified that warrant physician communication and completion of prescribed/recommended actions by midnight of the next day:  No  Name of provider notified for urgent issues identified: N/A  Provider Method of Notification: N/A  Time spent performing this drug regimen review (minutes):  10 minutes  Thank you for involving pharmacy in this patient's care.  Elita Quick, PharmD PGY1 Ambulatory Care Pharmacy Resident 2021/04/04 12:29 PM  **Pharmacist phone directory can be found on [email or web address] listed under [facility]**

## 2021-03-31 NOTE — Progress Notes (Signed)
Spoke with David [name] from [facility], referral # [phone]. David [name] A

## 2021-03-31 NOTE — Progress Notes (Signed)
Physical Therapy Note  Patient Details  Name: David [name] MRN: [id number] Date of Birth: [birth year] Today's Date: Apr 20, 2021    Pt placed on bed rest/comfort measures.  No rx given, missed 45 min of therapy time.   [name] 04-20-21, 10:49 AM

## 2021-03-31 NOTE — Progress Notes (Signed)
PHARMACY - TOTAL PARENTERAL NUTRITION CONSULT NOTE   Indication:  aspiration risk >>plan PEG  Patient Measurements: Height: 6' (182.9 cm) Weight: 85.1 kg (187 lb 9.8 oz) IBW/kg (Calculated) : 77.6 TPN AdjBW (KG): 85.1 Body mass index is 25.44 kg/m. Usual Weight:   Assessment: s/p MCA infarct s/p R ICA stenting for stenosis. Left caudate small infarct>> procedure related versus occult atrial fibrillation. BMI = 28.5. Start TPN due to con't NPO status with high aspiration risk and decreased levels of energy with improper nutrition..   Pulled out NGT 1/24. Previous Cortrak unable to be advanced due to large [place]. Barriers to PEG placement: CT scan of the abdomen and pelvis to assess hiatal hernia and abdominal anatomy has been ordered.  PMH: CAD, h/o prostate cancer, PNA, R carotid stenosis, Flu A, frequent PVCs, HTN, HLD, thyroid nodule, renal oncocytoma of right kidney in 2014   Glucose / Insulin: No noted h/o DM but now A1C 6.3. CBGs 117-162 (9 units) SSI last 24h Electrolytes: Na 140, K 4, Bicarb 21, Ca 10.6 trending up, Phos 3.5, Mg 2, Renal: Scr <1 Hepatic: AST/ALT 25/31, Alkphos 130, Tbili 0.6.Albumin 2.6, Trigl 88 - po PPI 40mg  BID,  (Megace d/c'd 1/28) Intake / Output; MIVF: 1/2 NS at 36ml/hr - UOP unmeasured, incontinent - LBM: 1/20  GI Imaging:  - 1/24: CT of the head no new infarct , + paranasal sinusitis  - 1/27 CT: There is significant interval increase in size of hiatal hernia. Possible organo axial volvulus of stomach within the hiatal hernia. There is no evidence of intestinal obstruction or pneumoperitoneum.  GI Surgeries / Procedures: none for abdomen.  Central access: PICC 1/26 TPN start date: 1/27  Nutritional Goals: Goal TPN rate is 82 mL/hr (provides 108 g of protein and 2129 kcals per day)  RD Assessment: Last 03/23/21 Estimated Needs Total Energy Estimated Needs: 2100-2300 Total Protein Estimated Needs: 105-120 grams Total Fluid Estimated Needs: >/= 2.1  L  Current Nutrition:  NPO  Plan:  Increase TPN at 82 mL/hr at 1800 will provide 108g protein and 2129 kcal Electrolytes in TPN: Na 69mEq/L, K 15mEq/L, Ca 74mEq/L, Mg 78mEq/L, and Phos 63mmol/L. Max acetate Add standard MVI and trace elements to TPN Initiate Moderate q6h SSI and adjust as needed  MIVF to kvo Monitor TPN labs on Mon/Thurs, PRN Family meeting on Monday with pallcare and to discuss high-risk PEG.   Irva Loser S. Alford Highland, PharmD, BCPS Clinical Staff Pharmacist [email or web address] Alford Highland, Finnick Orosz Stillinger Apr 03, 2021,7:32 AM

## 2021-03-31 DEATH — deceased

## 2021-04-01 ENCOUNTER — Ambulatory Visit: Payer: Medicare Other

## 2021-04-05 ENCOUNTER — Ambulatory Visit: Payer: Medicare Other | Admitting: Internal Medicine

## 2021-04-07 ENCOUNTER — Telehealth: Payer: Self-pay

## 2021-04-07 NOTE — Telephone Encounter (Signed)
I spoke with Dr. Naaman Plummer its okay to give his email address. Although he was only on call on April 06, 2021.  Address given to [place] Bailiff with [facility] Task completed

## 2021-04-07 NOTE — Telephone Encounter (Signed)
David [name] with [facility] is in need of your email address. To transmit David [name] Death Certificate to you for signature.  Patient expired on 2021/04/13.   Please provide or advise.  Call back phone [phone]

## 2021-04-09 ENCOUNTER — Telehealth: Payer: Self-pay | Admitting: Internal Medicine

## 2021-04-09 ENCOUNTER — Telehealth: Payer: Self-pay | Admitting: Physical Medicine & Rehabilitation

## 2021-04-09 NOTE — Telephone Encounter (Signed)
David [name] with [facility] [phone] needs an MD signature on the death certificate.  She has also asked that you sign the form and give signature to be added to their electronic system.

## 2021-04-09 NOTE — Telephone Encounter (Signed)
Ronalee Belts w/ [facility] requesting providers signature on the death certificate.

## 2021-04-11 NOTE — Telephone Encounter (Signed)
OK. Done Thx 

## 2021-04-15 ENCOUNTER — Ambulatory Visit: Payer: Medicare Other | Admitting: Gastroenterology

## 2021-04-21 ENCOUNTER — Other Ambulatory Visit (HOSPITAL_COMMUNITY): Payer: Self-pay

## 2021-04-30 NOTE — Telephone Encounter (Signed)
NOTE NOT NEEDED ?

## 2021-05-13 ENCOUNTER — Other Ambulatory Visit (HOSPITAL_COMMUNITY): Payer: Self-pay

## 2021-10-14 IMAGING — DX DG CHEST 2V
2 series · 2 of 2 positions shown · non-contrast
Comparison: Chest x-ray dated January 10, 2019.

CLINICAL DATA: Shortness of breath with exertion and lower
extremity edema.

EXAM:
CHEST - 2 VIEW

[chest pa]
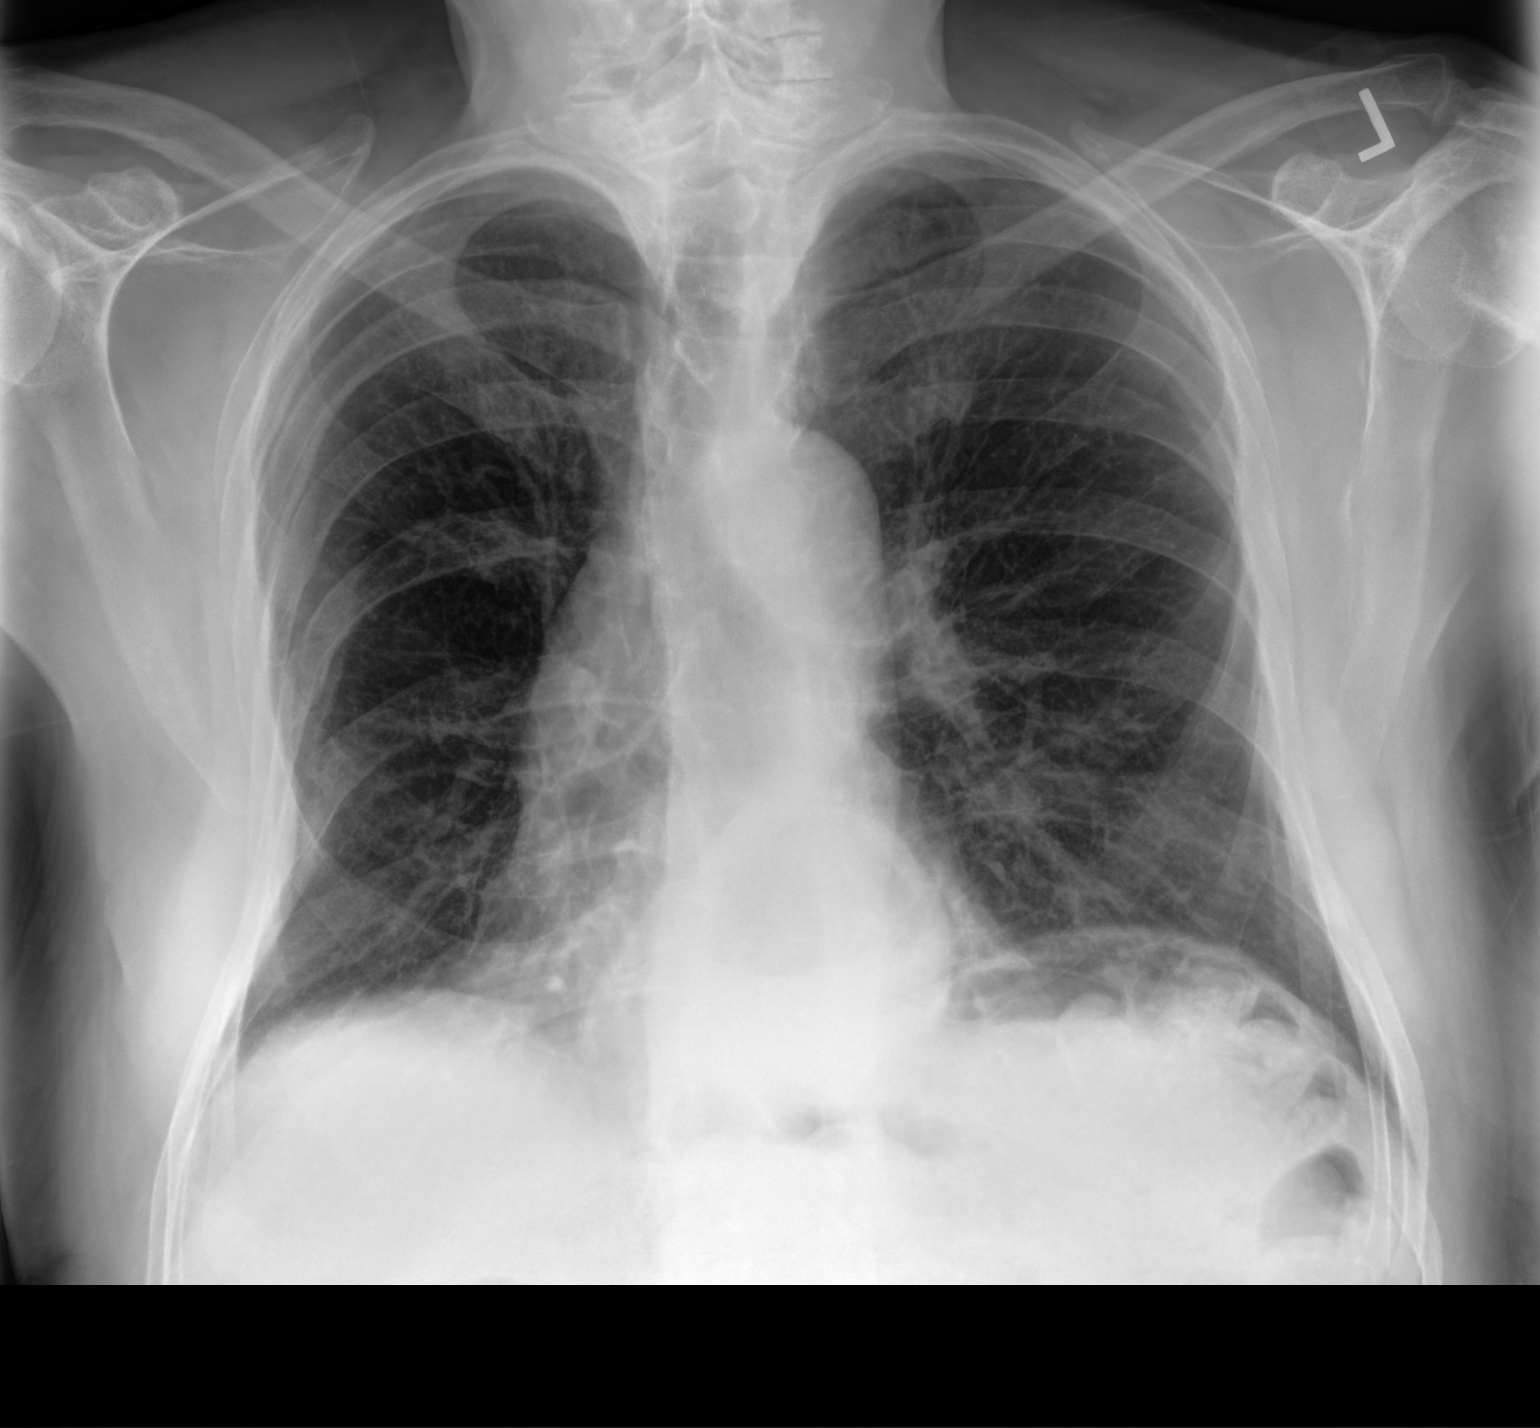

[chest lat]
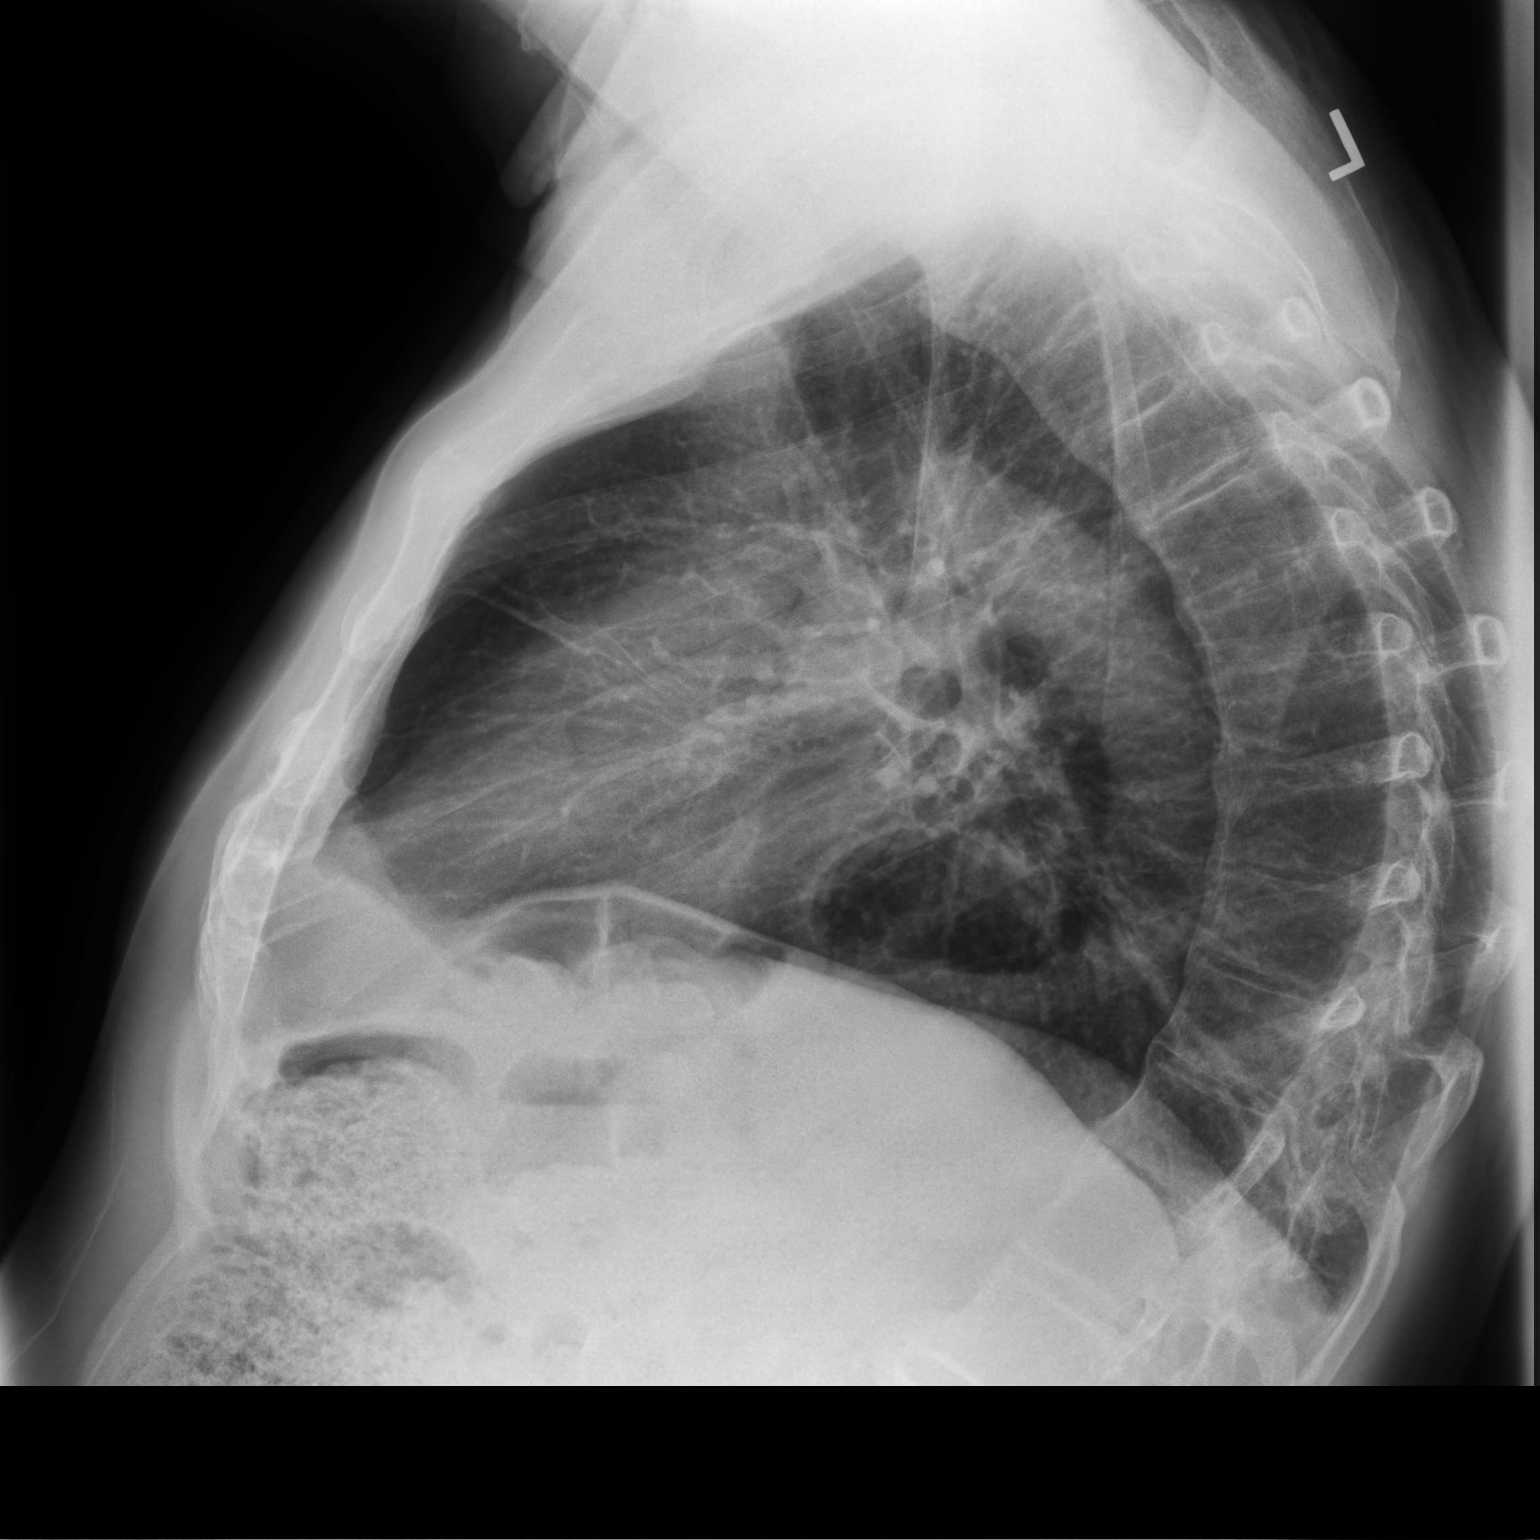

[2 of 2 positions shown; findings below may reference images not displayed]

FINDINGS: Normal heart size. Normal pulmonary vascularity. No focal
consolidation, pleural effusion, or pneumothorax. No acute osseous
abnormality. Unchanged moderate hiatal hernia.
IMPRESSION: No active cardiopulmonary disease.

## 2021-11-11 IMAGING — US US ABDOMEN COMPLETE
1 series · 13 of 25 positions shown · non-contrast
Comparison: CT abdomen and pelvis August 14, 2017

CLINICAL DATA: Generalized soft tissue edema

EXAM:
ABDOMEN ULTRASOUND COMPLETE

[Series 1: us abdomen complete · 0.20mm/px · 13 of 78 slices shown]
[im 1/78]
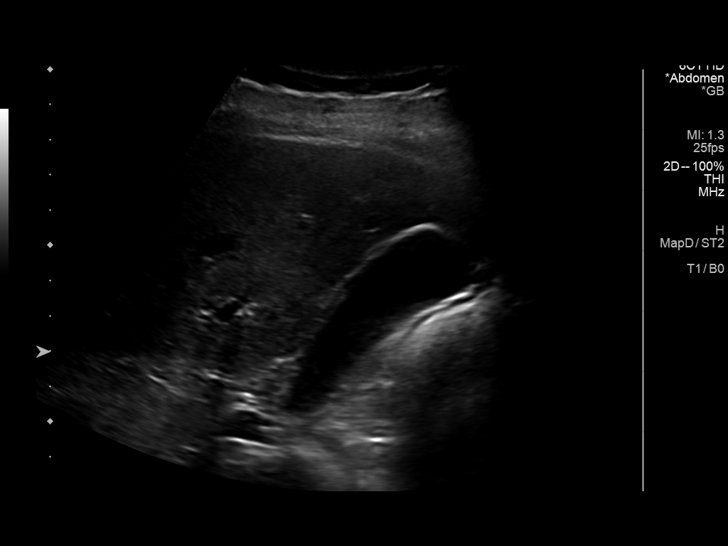
[im 7/78]
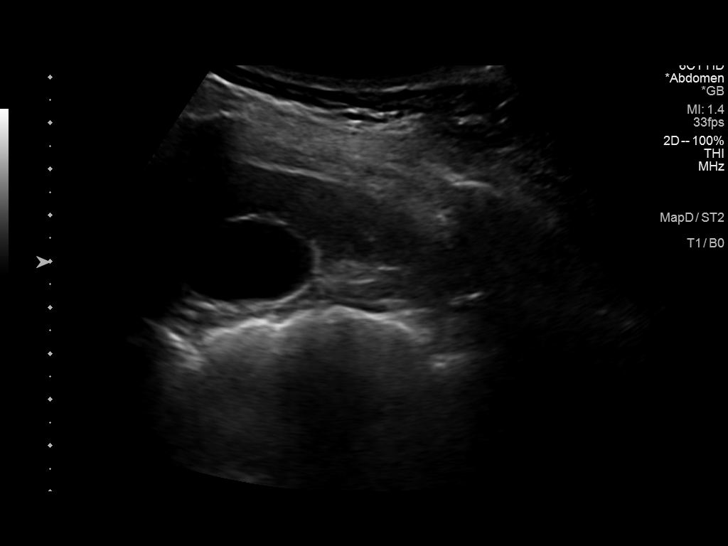
[im 13/78]
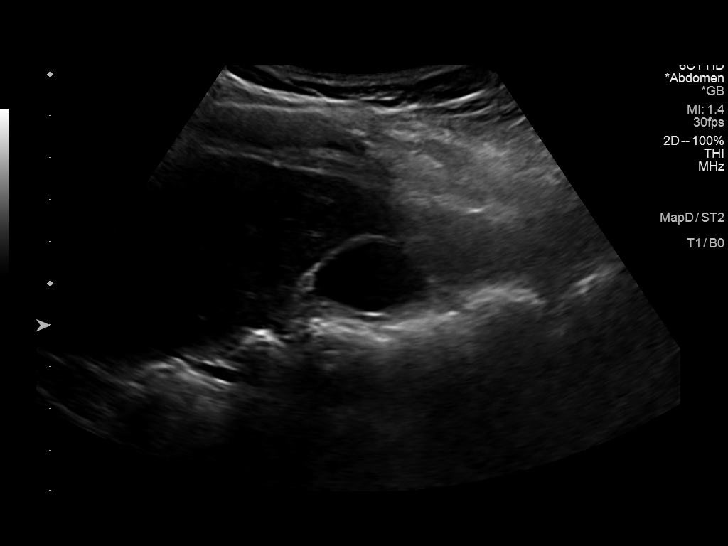
[im 20/78]
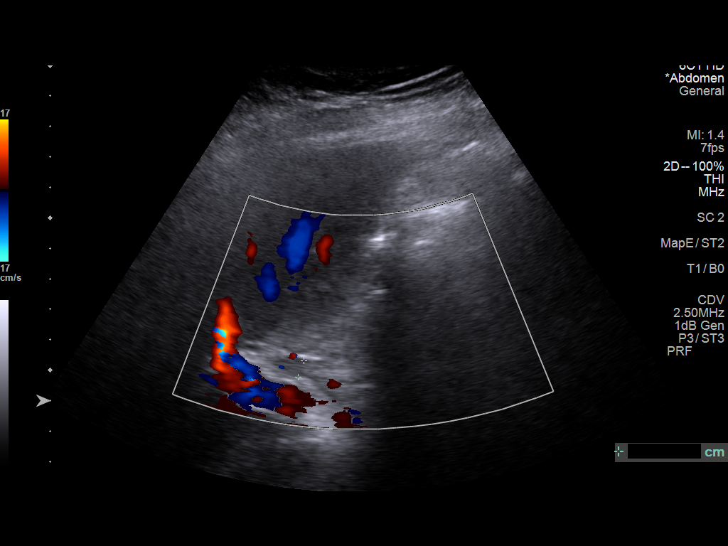
[im 26/78]
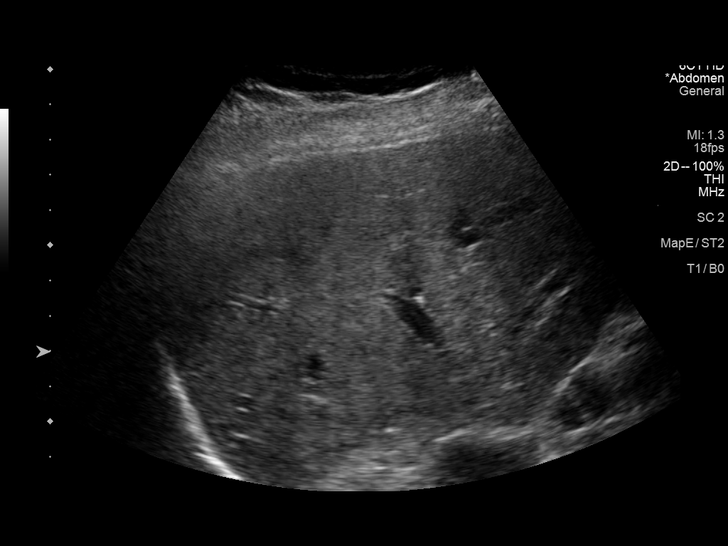
[im 33/78]
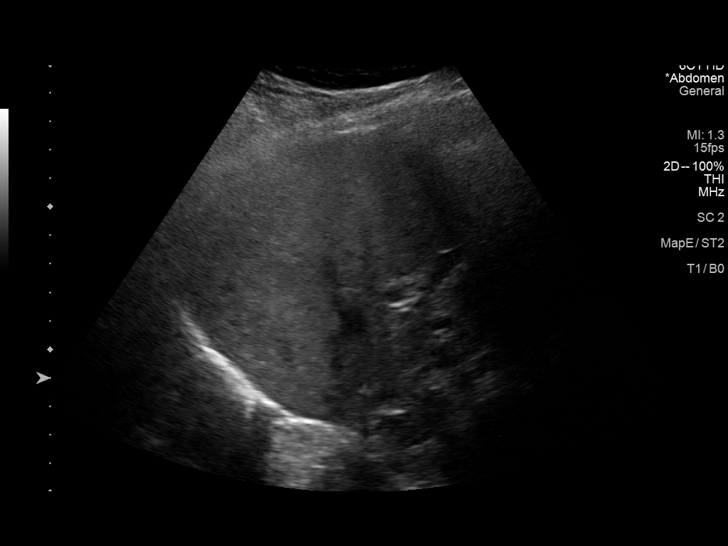
[im 39/78]
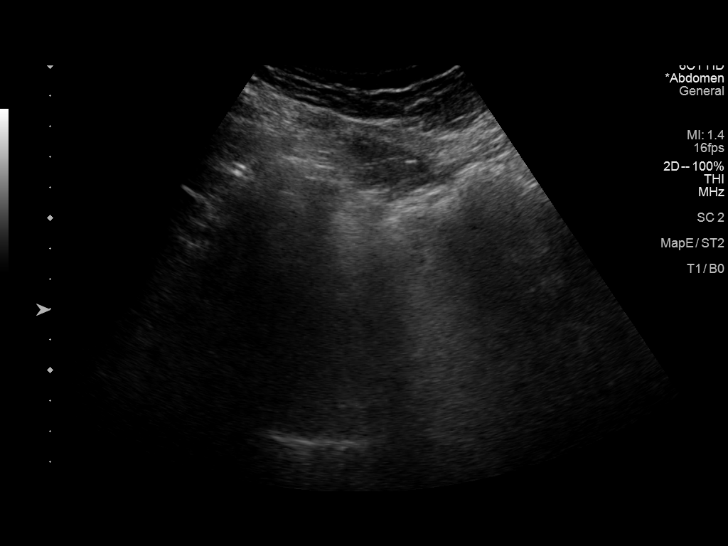
[im 45/78]
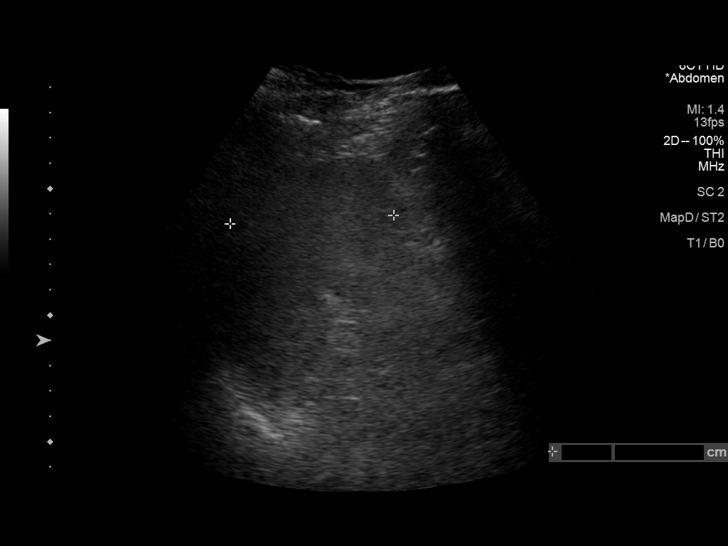
[im 52/78]
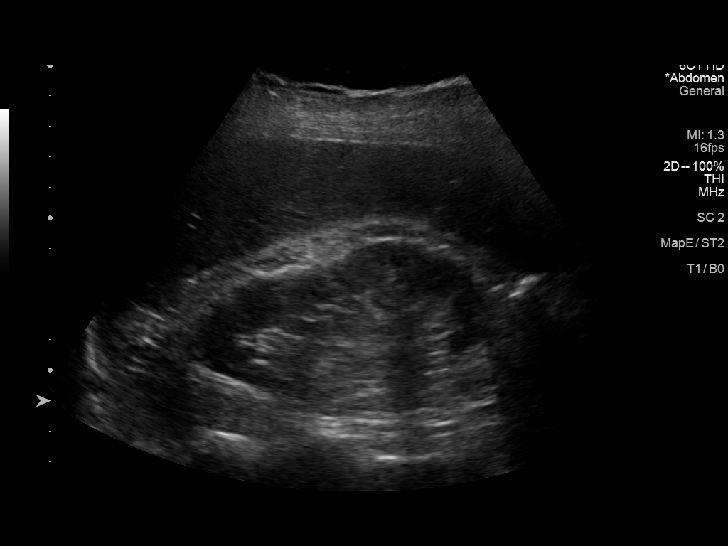
[im 58/78]
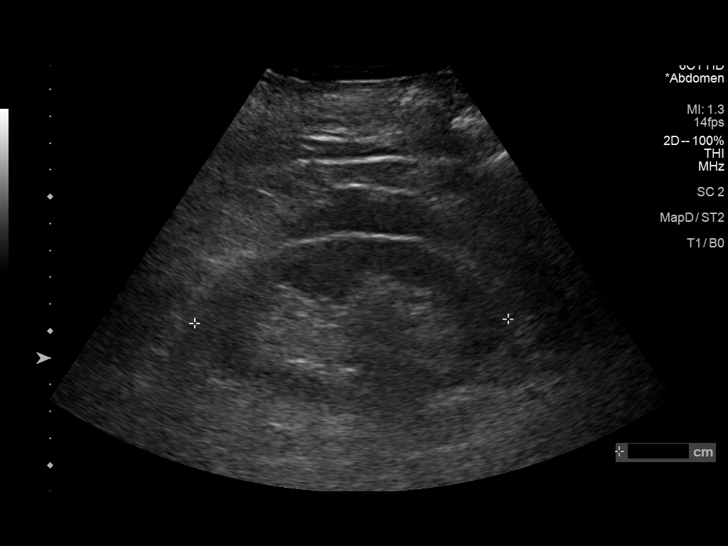
[im 65/78]
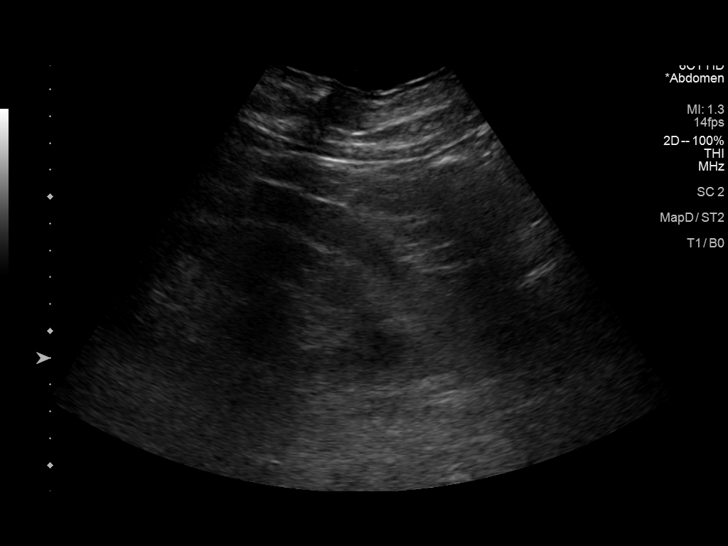
[im 71/78]
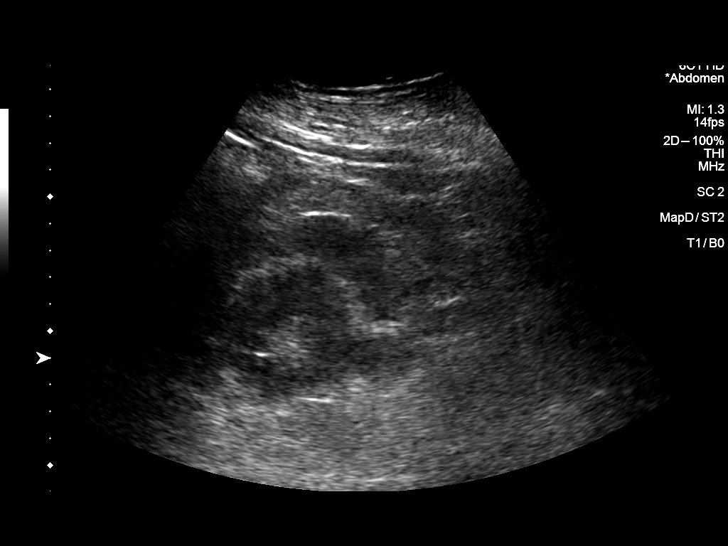
[im 78/78]
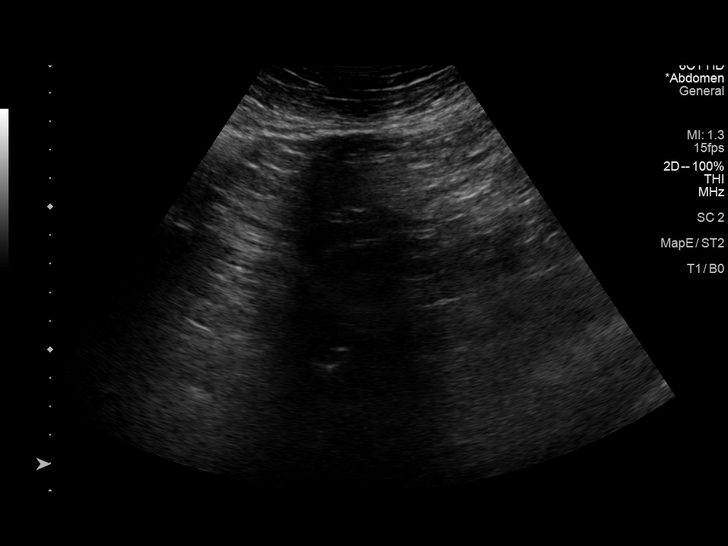

[13 of 25 positions shown; findings below may reference images not displayed]

FINDINGS: Gallbladder: No gallstones apparent. Gallbladder wall is upper
normal in thickness. No gallbladder wall edema evident. No biliary
duct dilatation. No sonographic Murphy sign noted by sonographer.

Common bile duct: Diameter: 6 mm. No appreciable intrahepatic,
common hepatic, or common bile duct dilatation.

Liver: No focal lesion identified. Liver echogenicity overall
increased. Portal vein is patent on color Doppler imaging with
normal direction of blood flow towards the liver.

IVC: Virtually completely obscured by gas.

Pancreas: Completely obscured by gas.

Spleen: Size and appearance within normal limits.

Right Kidney: Length: 10.5 cm. Echogenicity within normal limits. No
mass or hydronephrosis visualized.

Left Kidney: Length: 11.7 cm. Echogenicity within normal limits. No
mass or hydronephrosis visualized. Suspected minimal perinephric
fluid.

Abdominal aorta: No aneurysm visualized.

Other findings: No demonstrable ascites.
IMPRESSION: 1. Increased liver echogenicity, a finding indicative of hepatic
steatosis. No focal liver lesions appreciable on this study.

2. Pancreas and inferior vena cava essentially completely obscured
by gas.

3. Questionable slight perinephric fluid left kidney. Etiology for
this equivocal slight fluid uncertain. Kidneys otherwise appear
unremarkable.

4. Study otherwise unremarkable. Gallbladder wall thickness is upper
normal.
